# Patient Record
Sex: Male | Born: 1980 | Race: White | Hispanic: No | Marital: Married | Smoking: Never smoker
Health system: Southern US, Community
[De-identification: ages and names within clinical notes are randomized; demographics above are authoritative.]

## PROBLEM LIST (undated history)

## (undated) ENCOUNTER — Encounter
Attending: Student in an Organized Health Care Education/Training Program | Primary: Student in an Organized Health Care Education/Training Program

## (undated) ENCOUNTER — Encounter: Attending: Dermatology | Primary: Dermatology

## (undated) ENCOUNTER — Ambulatory Visit

## (undated) ENCOUNTER — Encounter: Attending: Family | Primary: Family

## (undated) ENCOUNTER — Encounter

## (undated) ENCOUNTER — Ambulatory Visit: Payer: MEDICAID | Attending: Family | Primary: Family

## (undated) ENCOUNTER — Telehealth

## (undated) ENCOUNTER — Ambulatory Visit: Payer: MEDICARE

## (undated) ENCOUNTER — Ambulatory Visit: Payer: MEDICAID

## (undated) ENCOUNTER — Encounter: Payer: MEDICAID | Attending: Dermatology | Primary: Dermatology

## (undated) ENCOUNTER — Ambulatory Visit: Payer: MEDICARE | Attending: Family | Primary: Family

## (undated) ENCOUNTER — Ambulatory Visit: Payer: MEDICAID | Attending: Hematology & Oncology | Primary: Hematology & Oncology

## (undated) ENCOUNTER — Ambulatory Visit
Payer: Medicare (Managed Care) | Attending: Student in an Organized Health Care Education/Training Program | Primary: Student in an Organized Health Care Education/Training Program

## (undated) ENCOUNTER — Ambulatory Visit: Payer: MEDICAID | Attending: Obesity Medicine | Primary: Obesity Medicine

## (undated) ENCOUNTER — Telehealth: Attending: Family | Primary: Family

## (undated) ENCOUNTER — Encounter: Attending: Physician Assistant | Primary: Physician Assistant

## (undated) ENCOUNTER — Encounter: Attending: Registered" | Primary: Registered"

## (undated) ENCOUNTER — Ambulatory Visit: Payer: MEDICAID | Attending: Registered" | Primary: Registered"

## (undated) ENCOUNTER — Ambulatory Visit: Payer: MEDICARE | Attending: Retina Specialist | Primary: Retina Specialist

## (undated) ENCOUNTER — Ambulatory Visit: Payer: Medicare (Managed Care) | Attending: Family | Primary: Family

## (undated) ENCOUNTER — Telehealth: Attending: Dermatology | Primary: Dermatology

## (undated) ENCOUNTER — Ambulatory Visit: Attending: Family | Primary: Family

## (undated) ENCOUNTER — Other Ambulatory Visit

## (undated) ENCOUNTER — Ambulatory Visit: Payer: MEDICARE | Attending: Physician Assistant | Primary: Physician Assistant

## (undated) ENCOUNTER — Ambulatory Visit: Payer: Medicare (Managed Care) | Attending: Retina Specialist | Primary: Retina Specialist

## (undated) ENCOUNTER — Ambulatory Visit
Payer: MEDICAID | Attending: Student in an Organized Health Care Education/Training Program | Primary: Student in an Organized Health Care Education/Training Program

## (undated) ENCOUNTER — Non-Acute Institutional Stay: Payer: MEDICAID

## (undated) ENCOUNTER — Encounter: Attending: Critical Care Medicine | Primary: Critical Care Medicine

## (undated) ENCOUNTER — Encounter: Attending: Internal Medicine | Primary: Internal Medicine

## (undated) ENCOUNTER — Ambulatory Visit: Payer: Medicare (Managed Care)

## (undated) ENCOUNTER — Encounter: Payer: MEDICAID | Attending: Family | Primary: Family

## (undated) ENCOUNTER — Ambulatory Visit: Payer: Medicare (Managed Care) | Attending: Physician Assistant | Primary: Physician Assistant

## (undated) ENCOUNTER — Ambulatory Visit
Payer: MEDICARE | Attending: Student in an Organized Health Care Education/Training Program | Primary: Student in an Organized Health Care Education/Training Program

## (undated) ENCOUNTER — Ambulatory Visit: Payer: MEDICAID | Attending: Dermatology | Primary: Dermatology

## (undated) ENCOUNTER — Encounter: Payer: MEDICARE | Attending: Dermatology | Primary: Dermatology

## (undated) ENCOUNTER — Telehealth
Attending: Student in an Organized Health Care Education/Training Program | Primary: Student in an Organized Health Care Education/Training Program

## (undated) ENCOUNTER — Other Ambulatory Visit: Payer: MEDICAID

## (undated) DIAGNOSIS — J45909 Unspecified asthma, uncomplicated: Secondary | ICD-10-CM

## (undated) DIAGNOSIS — F32A Depression, unspecified: Secondary | ICD-10-CM

## (undated) DIAGNOSIS — E079 Disorder of thyroid, unspecified: Secondary | ICD-10-CM

## (undated) DIAGNOSIS — M109 Gout, unspecified: Secondary | ICD-10-CM

## (undated) DIAGNOSIS — E119 Type 2 diabetes mellitus without complications: Secondary | ICD-10-CM

## (undated) DIAGNOSIS — K589 Irritable bowel syndrome without diarrhea: Secondary | ICD-10-CM

## (undated) DIAGNOSIS — G4730 Sleep apnea, unspecified: Secondary | ICD-10-CM

## (undated) DIAGNOSIS — F329 Major depressive disorder, single episode, unspecified: Secondary | ICD-10-CM

## (undated) HISTORY — PX: ESOPHAGOGASTRODUODENOSCOPY ENDOSCOPY: SHX5814

## (undated) HISTORY — PX: OTHER SURGICAL HISTORY: SHX169

## (undated) HISTORY — PX: COLONOSCOPY: SHX174

## (undated) HISTORY — PX: EYE SURGERY: SHX253

## (undated) MED ORDER — METHYLPREDNISOLONE 4 MG TABLETS IN A DOSE PACK: 0.00000 days

## (undated) MED ORDER — INSULIN SYRINGE U-100 WITH NEEDLE 1 ML 31 GAUGE X 5/16" (8 MM): 0.00000 days

## (undated) MED ORDER — MONTELUKAST 10 MG TABLET: 0.00000 days

## (undated) MED ORDER — CIPROFLOXACIN 0.3 %-DEXAMETHASONE 0.1 % EAR DROPS,SUSPENSION: 0.00000 days

## (undated) MED ORDER — CLINDAMYCIN PHOSPHATE 1 % TOPICAL SOLUTION: 0.00000 days

## (undated) MED ORDER — LANTUS U-100 INSULIN 100 UNIT/ML SUBCUTANEOUS SOLUTION: 0.00000 days

## (undated) MED ORDER — DOXYCYCLINE HYCLATE 100 MG CAPSULE: 0.00000 days

## (undated) MED ORDER — LANCETS: 0.00000 days

## (undated) MED ORDER — DOXYCYCLINE HYCLATE 100 MG TABLET: 0.00000 days

## (undated) MED ORDER — NEOMYCIN-POLYMYXIN-HYDROCORT 3.5 MG-10,000 UNIT/ML-1 % EAR DROPS,SUSP: 0.00000 days

## (undated) MED ORDER — TRAMADOL 50 MG TABLET: 0 days

## (undated) MED ORDER — DIAZEPAM 5 MG TABLET: 0.00000 days

## (undated) MED ORDER — METHYLPREDNISOLONE 4 MG TABLET
0 days
Start: ? — End: 2020-10-11

## (undated) MED ORDER — ERENUMAB-AOOE 140 MG/ML SUBCUTANEOUS AUTO-INJECTOR: 140 mg | mL | 6 refills | 0 days

## (undated) MED ORDER — DEXTROAMPHETAMINE-AMPHETAMINE ER 20 MG 24HR CAPSULE,EXTEND RELEASE: 0.00000 days

## (undated) MED ORDER — ACCU-CHEK GUIDE TEST STRIPS: 0.00000 days

---
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
## 2004-10-09 ENCOUNTER — Emergency Department: Payer: Self-pay | Admitting: Unknown Physician Specialty

## 2008-10-14 ENCOUNTER — Emergency Department: Payer: Self-pay | Admitting: Emergency Medicine

## 2008-10-19 ENCOUNTER — Emergency Department: Payer: Self-pay | Admitting: Emergency Medicine

## 2009-01-03 ENCOUNTER — Emergency Department: Payer: Self-pay | Admitting: Emergency Medicine

## 2009-01-06 ENCOUNTER — Emergency Department: Payer: Self-pay | Admitting: Emergency Medicine

## 2009-02-07 ENCOUNTER — Emergency Department: Payer: Self-pay | Admitting: Emergency Medicine

## 2009-08-04 ENCOUNTER — Emergency Department: Payer: Self-pay | Admitting: Emergency Medicine

## 2009-09-02 ENCOUNTER — Emergency Department: Payer: Self-pay | Admitting: Emergency Medicine

## 2009-10-20 ENCOUNTER — Emergency Department: Payer: Self-pay | Admitting: Emergency Medicine

## 2009-12-23 ENCOUNTER — Inpatient Hospital Stay: Payer: Self-pay | Admitting: Psychiatry

## 2010-01-09 ENCOUNTER — Ambulatory Visit: Payer: Self-pay

## 2010-05-17 ENCOUNTER — Emergency Department: Payer: Self-pay | Admitting: Emergency Medicine

## 2010-06-29 ENCOUNTER — Emergency Department: Payer: Self-pay | Admitting: Emergency Medicine

## 2010-07-10 DIAGNOSIS — G5630 Lesion of radial nerve, unspecified upper limb: Secondary | ICD-10-CM | POA: Insufficient documentation

## 2010-08-20 ENCOUNTER — Emergency Department: Payer: Self-pay | Admitting: Emergency Medicine

## 2011-04-29 ENCOUNTER — Emergency Department: Payer: Self-pay | Admitting: Emergency Medicine

## 2011-07-04 ENCOUNTER — Emergency Department: Payer: Self-pay | Admitting: Emergency Medicine

## 2011-07-04 LAB — URINALYSIS, COMPLETE
Bacteria: NONE SEEN
Bilirubin,UR: NEGATIVE
Blood: NEGATIVE
Glucose,UR: NEGATIVE mg/dL (ref 0–75)
Ketone: NEGATIVE
Leukocyte Esterase: NEGATIVE
Nitrite: NEGATIVE
Ph: 5 (ref 4.5–8.0)
RBC,UR: 4 /HPF (ref 0–5)
Specific Gravity: 1.021 (ref 1.003–1.030)
Squamous Epithelial: <1

## 2011-08-04 ENCOUNTER — Emergency Department: Payer: Self-pay | Admitting: *Deleted

## 2011-08-26 ENCOUNTER — Emergency Department: Payer: Self-pay | Admitting: Emergency Medicine

## 2011-08-26 LAB — COMPREHENSIVE METABOLIC PANEL
Albumin: 3.8 g/dL (ref 3.4–5.0)
Alkaline Phosphatase: 70 U/L (ref 50–136)
Anion Gap: 12 (ref 7–16)
Calcium, Total: 8.9 mg/dL (ref 8.5–10.1)
Co2: 24 mmol/L (ref 21–32)
Creatinine: 0.95 mg/dL (ref 0.60–1.30)
EGFR (Non-African Amer.): >60
Glucose: 107 mg/dL — ABNORMAL HIGH (ref 65–99)
Osmolality: 283 (ref 275–301)
SGPT (ALT): 52 U/L

## 2011-08-26 LAB — CBC
HCT: 43.6 % (ref 40.0–52.0)
HGB: 14.9 g/dL (ref 13.0–18.0)
MCH: 32.2 pg (ref 26.0–34.0)
MCV: 94 fL (ref 80–100)
Platelet: 192 10*3/uL (ref 150–440)
RBC: 4.62 10*6/uL (ref 4.40–5.90)
WBC: 9.4 10*3/uL (ref 3.8–10.6)

## 2011-11-29 ENCOUNTER — Emergency Department: Payer: Self-pay | Admitting: Emergency Medicine

## 2012-01-26 ENCOUNTER — Emergency Department: Payer: Self-pay | Admitting: Unknown Physician Specialty

## 2012-01-27 LAB — COMPREHENSIVE METABOLIC PANEL
Albumin: 3.5 g/dL (ref 3.4–5.0)
Alkaline Phosphatase: 82 U/L (ref 50–136)
Anion Gap: 8 (ref 7–16)
Calcium, Total: 9 mg/dL (ref 8.5–10.1)
EGFR (Non-African Amer.): >60
SGOT(AST): 34 U/L (ref 15–37)
SGPT (ALT): 51 U/L (ref 12–78)
Total Protein: 7.5 g/dL (ref 6.4–8.2)

## 2012-01-27 LAB — CBC
HGB: 15 g/dL (ref 13.0–18.0)
MCH: 31.8 pg (ref 26.0–34.0)
MCHC: 33.9 g/dL (ref 32.0–36.0)
MCV: 94 fL (ref 80–100)
RBC: 4.71 10*6/uL (ref 4.40–5.90)

## 2012-03-16 ENCOUNTER — Emergency Department: Payer: Self-pay | Admitting: Emergency Medicine

## 2012-04-06 ENCOUNTER — Emergency Department: Payer: Self-pay | Admitting: Emergency Medicine

## 2012-04-12 ENCOUNTER — Emergency Department: Payer: Self-pay | Admitting: Emergency Medicine

## 2012-04-19 ENCOUNTER — Emergency Department: Payer: Self-pay | Admitting: Emergency Medicine

## 2012-05-20 ENCOUNTER — Emergency Department: Payer: Self-pay | Admitting: Emergency Medicine

## 2012-06-11 ENCOUNTER — Emergency Department: Payer: Self-pay | Admitting: Emergency Medicine

## 2012-07-21 DIAGNOSIS — G4733 Obstructive sleep apnea (adult) (pediatric): Secondary | ICD-10-CM | POA: Insufficient documentation

## 2012-09-27 ENCOUNTER — Emergency Department: Payer: Self-pay | Admitting: Emergency Medicine

## 2012-09-27 LAB — URINALYSIS, COMPLETE
Ketone: NEGATIVE
Leukocyte Esterase: NEGATIVE
Ph: 6 (ref 4.5–8.0)
RBC,UR: 1 /HPF (ref 0–5)
Specific Gravity: 1.029 (ref 1.003–1.030)
Squamous Epithelial: <1
WBC UR: 1 /HPF (ref 0–5)

## 2012-09-27 LAB — COMPREHENSIVE METABOLIC PANEL
Alkaline Phosphatase: 122 U/L (ref 50–136)
Anion Gap: 8 (ref 7–16)
Bilirubin,Total: 0.7 mg/dL (ref 0.2–1.0)
Calcium, Total: 8.5 mg/dL (ref 8.5–10.1)
Chloride: 101 mmol/L (ref 98–107)
Co2: 25 mmol/L (ref 21–32)
EGFR (African American): >60
EGFR (Non-African Amer.): >60
Potassium: 4.2 mmol/L (ref 3.5–5.1)
SGOT(AST): 94 U/L — ABNORMAL HIGH (ref 15–37)

## 2012-09-27 LAB — CBC
HCT: 43 % (ref 40.0–52.0)
MCH: 32.4 pg (ref 26.0–34.0)
MCV: 94 fL (ref 80–100)
RBC: 4.56 10*6/uL (ref 4.40–5.90)
RDW: 13.8 % (ref 11.5–14.5)

## 2012-09-28 LAB — LIPASE, BLOOD: Lipase: 384 U/L (ref 73–393)

## 2012-10-05 ENCOUNTER — Emergency Department: Payer: Self-pay | Admitting: Emergency Medicine

## 2012-10-05 LAB — COMPREHENSIVE METABOLIC PANEL
Anion Gap: 9 (ref 7–16)
BUN: 9 mg/dL (ref 7–18)
Bilirubin,Total: 0.8 mg/dL (ref 0.2–1.0)
Calcium, Total: 9 mg/dL (ref 8.5–10.1)
Co2: 26 mmol/L (ref 21–32)
Creatinine: 0.86 mg/dL (ref 0.60–1.30)
Glucose: 455 mg/dL — ABNORMAL HIGH (ref 65–99)
Osmolality: 287 (ref 275–301)
SGOT(AST): 114 U/L — ABNORMAL HIGH (ref 15–37)
Sodium: 134 mmol/L — ABNORMAL LOW (ref 136–145)
Total Protein: 8 g/dL (ref 6.4–8.2)

## 2012-10-05 LAB — URINALYSIS, COMPLETE
Bilirubin,UR: NEGATIVE
Glucose,UR: 300 mg/dL (ref 0–75)
Leukocyte Esterase: NEGATIVE
Nitrite: NEGATIVE
Ph: 6 (ref 4.5–8.0)
RBC,UR: 1 /HPF (ref 0–5)
Squamous Epithelial: 1

## 2012-10-05 LAB — CBC
HCT: 43.5 % (ref 40.0–52.0)
HGB: 15.1 g/dL (ref 13.0–18.0)
MCH: 32.3 pg (ref 26.0–34.0)
MCHC: 34.6 g/dL (ref 32.0–36.0)
MCV: 93 fL (ref 80–100)
Platelet: 159 10*3/uL (ref 150–440)
RBC: 4.67 10*6/uL (ref 4.40–5.90)

## 2012-10-06 LAB — CK TOTAL AND CKMB (NOT AT ARMC): CK, Total: 187 U/L (ref 35–232)

## 2012-10-06 LAB — TROPONIN I: Troponin-I: <0.02 ng/mL

## 2012-12-05 ENCOUNTER — Emergency Department: Payer: Self-pay | Admitting: Emergency Medicine

## 2012-12-05 LAB — URINALYSIS, COMPLETE
Glucose,UR: >=500 mg/dL (ref 0–75)
Ph: 6 (ref 4.5–8.0)
Protein: NEGATIVE
Specific Gravity: 1.025 (ref 1.003–1.030)
Squamous Epithelial: 2
WBC UR: 4 /HPF (ref 0–5)

## 2012-12-05 LAB — BASIC METABOLIC PANEL
BUN: 9 mg/dL (ref 7–18)
Chloride: 104 mmol/L (ref 98–107)
Co2: 27 mmol/L (ref 21–32)
EGFR (African American): >60
EGFR (Non-African Amer.): >60
Potassium: 4 mmol/L (ref 3.5–5.1)

## 2012-12-06 LAB — CBC WITH DIFFERENTIAL/PLATELET
Eosinophil %: 2.4 %
HGB: 13.3 g/dL (ref 13.0–18.0)
Lymphocyte #: 3.1 10*3/uL (ref 1.0–3.6)
MCH: 31.9 pg (ref 26.0–34.0)
Monocyte #: 0.6 x10 3/mm (ref 0.2–1.0)
Monocyte %: 5.5 %
Neutrophil #: 6 10*3/uL (ref 1.4–6.5)

## 2012-12-06 LAB — HEPATIC FUNCTION PANEL A (ARMC)
Bilirubin, Direct: 0.2 mg/dL (ref 0.00–0.20)
Bilirubin,Total: 1.1 mg/dL — ABNORMAL HIGH (ref 0.2–1.0)
SGOT(AST): 89 U/L — ABNORMAL HIGH (ref 15–37)

## 2013-01-11 ENCOUNTER — Emergency Department: Payer: Self-pay | Admitting: Emergency Medicine

## 2013-01-11 LAB — COMPREHENSIVE METABOLIC PANEL
Albumin: 3.5 g/dL (ref 3.4–5.0)
Alkaline Phosphatase: 93 U/L (ref 50–136)
Anion Gap: 4 — ABNORMAL LOW (ref 7–16)
Bilirubin,Total: 1 mg/dL (ref 0.2–1.0)
Chloride: 104 mmol/L (ref 98–107)
Co2: 29 mmol/L (ref 21–32)
EGFR (African American): >60
EGFR (Non-African Amer.): >60
SGOT(AST): 73 U/L — ABNORMAL HIGH (ref 15–37)
Sodium: 137 mmol/L (ref 136–145)
Total Protein: 7.8 g/dL (ref 6.4–8.2)

## 2013-01-11 LAB — URINALYSIS, COMPLETE
Blood: NEGATIVE
Glucose,UR: NEGATIVE mg/dL (ref 0–75)
Ketone: NEGATIVE
Leukocyte Esterase: NEGATIVE
Nitrite: NEGATIVE
Ph: 5 (ref 4.5–8.0)
Protein: NEGATIVE
Specific Gravity: 1.018 (ref 1.003–1.030)
WBC UR: 3 /HPF (ref 0–5)

## 2013-01-11 LAB — CBC
HCT: 39.6 % — ABNORMAL LOW (ref 40.0–52.0)
HGB: 13.9 g/dL (ref 13.0–18.0)
MCV: 93 fL (ref 80–100)
Platelet: 179 10*3/uL (ref 150–440)
RDW: 13.8 % (ref 11.5–14.5)
WBC: 9.3 10*3/uL (ref 3.8–10.6)

## 2013-01-11 LAB — LIPASE, BLOOD: Lipase: 188 U/L (ref 73–393)

## 2013-01-12 LAB — CLOSTRIDIUM DIFFICILE BY PCR

## 2013-01-27 ENCOUNTER — Encounter: Payer: Self-pay | Admitting: Family Medicine

## 2013-02-08 ENCOUNTER — Encounter: Payer: Self-pay | Admitting: Family Medicine

## 2013-02-09 DIAGNOSIS — R45851 Suicidal ideations: Secondary | ICD-10-CM | POA: Insufficient documentation

## 2013-02-11 ENCOUNTER — Emergency Department: Payer: Self-pay | Admitting: Emergency Medicine

## 2013-02-17 ENCOUNTER — Emergency Department: Payer: Self-pay | Admitting: Emergency Medicine

## 2013-03-05 DIAGNOSIS — R109 Unspecified abdominal pain: Secondary | ICD-10-CM | POA: Insufficient documentation

## 2013-07-05 DIAGNOSIS — R51 Headache: Secondary | ICD-10-CM

## 2013-07-05 DIAGNOSIS — R519 Headache, unspecified: Secondary | ICD-10-CM | POA: Insufficient documentation

## 2013-08-12 DIAGNOSIS — K760 Fatty (change of) liver, not elsewhere classified: Secondary | ICD-10-CM | POA: Insufficient documentation

## 2013-09-03 DIAGNOSIS — K644 Residual hemorrhoidal skin tags: Secondary | ICD-10-CM | POA: Insufficient documentation

## 2013-09-13 DIAGNOSIS — F419 Anxiety disorder, unspecified: Secondary | ICD-10-CM | POA: Insufficient documentation

## 2013-09-13 DIAGNOSIS — R197 Diarrhea, unspecified: Secondary | ICD-10-CM | POA: Insufficient documentation

## 2013-10-04 DIAGNOSIS — M25512 Pain in left shoulder: Secondary | ICD-10-CM | POA: Insufficient documentation

## 2013-11-15 ENCOUNTER — Emergency Department: Payer: Self-pay | Admitting: Emergency Medicine

## 2013-11-30 ENCOUNTER — Emergency Department: Payer: Self-pay | Admitting: Emergency Medicine

## 2013-11-30 LAB — TROPONIN I

## 2013-11-30 LAB — BASIC METABOLIC PANEL
ANION GAP: 8 (ref 7–16)
BUN: 9 mg/dL (ref 7–18)
CHLORIDE: 104 mmol/L (ref 98–107)
CO2: 29 mmol/L (ref 21–32)
CREATININE: 0.83 mg/dL (ref 0.60–1.30)
Calcium, Total: 8.7 mg/dL (ref 8.5–10.1)
EGFR (African American): >60
EGFR (Non-African Amer.): >60
GLUCOSE: 136 mg/dL — AB (ref 65–99)
Osmolality: 282 (ref 275–301)
POTASSIUM: 4 mmol/L (ref 3.5–5.1)
Sodium: 141 mmol/L (ref 136–145)

## 2013-11-30 LAB — D-DIMER(ARMC): D-DIMER: 192 ng/mL

## 2013-11-30 LAB — CBC
HCT: 42 % (ref 40.0–52.0)
HGB: 13.7 g/dL (ref 13.0–18.0)
MCH: 31 pg (ref 26.0–34.0)
MCHC: 32.6 g/dL (ref 32.0–36.0)
MCV: 95 fL (ref 80–100)
Platelet: 174 10*3/uL (ref 150–440)
RBC: 4.41 10*6/uL (ref 4.40–5.90)
RDW: 14.4 % (ref 11.5–14.5)
WBC: 11 10*3/uL — ABNORMAL HIGH (ref 3.8–10.6)

## 2013-12-24 ENCOUNTER — Emergency Department: Payer: Self-pay | Admitting: Emergency Medicine

## 2013-12-24 LAB — COMPREHENSIVE METABOLIC PANEL
ALT: 51 U/L (ref 12–78)
Albumin: 3.9 g/dL (ref 3.4–5.0)
Alkaline Phosphatase: 90 U/L
Anion Gap: 8 (ref 7–16)
BILIRUBIN TOTAL: 1.7 mg/dL — AB (ref 0.2–1.0)
BUN: 10 mg/dL (ref 7–18)
CHLORIDE: 104 mmol/L (ref 98–107)
Calcium, Total: 9.2 mg/dL (ref 8.5–10.1)
Co2: 26 mmol/L (ref 21–32)
Creatinine: 1.07 mg/dL (ref 0.60–1.30)
EGFR (African American): >60
EGFR (Non-African Amer.): >60
Glucose: 130 mg/dL — ABNORMAL HIGH (ref 65–99)
Osmolality: 276 (ref 275–301)
Potassium: 3.7 mmol/L (ref 3.5–5.1)
SGOT(AST): 44 U/L — ABNORMAL HIGH (ref 15–37)
SODIUM: 138 mmol/L (ref 136–145)
TOTAL PROTEIN: 8.4 g/dL — AB (ref 6.4–8.2)

## 2013-12-24 LAB — URINALYSIS, COMPLETE
BLOOD: NEGATIVE
Bilirubin,UR: NEGATIVE
Glucose,UR: NEGATIVE mg/dL (ref 0–75)
Ketone: NEGATIVE
LEUKOCYTE ESTERASE: NEGATIVE
Nitrite: NEGATIVE
Ph: 5 (ref 4.5–8.0)
Protein: NEGATIVE
RBC,UR: 3 /HPF (ref 0–5)
Specific Gravity: 1.025 (ref 1.003–1.030)
Squamous Epithelial: 3
WBC UR: 9 /HPF (ref 0–5)

## 2013-12-24 LAB — CBC WITH DIFFERENTIAL/PLATELET
BASOS PCT: 1.5 %
Basophil #: 0.2 10*3/uL — ABNORMAL HIGH (ref 0.0–0.1)
EOS PCT: 1.2 %
Eosinophil #: 0.2 10*3/uL (ref 0.0–0.7)
HCT: 46.6 % (ref 40.0–52.0)
HGB: 15.4 g/dL (ref 13.0–18.0)
LYMPHS ABS: 3.7 10*3/uL — AB (ref 1.0–3.6)
LYMPHS PCT: 28.6 %
MCH: 31.7 pg (ref 26.0–34.0)
MCHC: 33 g/dL (ref 32.0–36.0)
MCV: 96 fL (ref 80–100)
MONOS PCT: 9.2 %
Monocyte #: 1.2 x10 3/mm — ABNORMAL HIGH (ref 0.2–1.0)
Neutrophil #: 7.8 10*3/uL — ABNORMAL HIGH (ref 1.4–6.5)
Neutrophil %: 59.5 %
Platelet: 184 10*3/uL (ref 150–440)
RBC: 4.85 10*6/uL (ref 4.40–5.90)
RDW: 14.3 % (ref 11.5–14.5)
WBC: 13 10*3/uL — ABNORMAL HIGH (ref 3.8–10.6)

## 2013-12-24 LAB — LIPASE, BLOOD: LIPASE: 160 U/L (ref 73–393)

## 2014-01-17 DIAGNOSIS — L918 Other hypertrophic disorders of the skin: Secondary | ICD-10-CM | POA: Insufficient documentation

## 2014-02-13 ENCOUNTER — Emergency Department: Payer: Self-pay | Admitting: Emergency Medicine

## 2014-02-14 LAB — URINALYSIS, COMPLETE
Bilirubin,UR: NEGATIVE
Blood: NEGATIVE
KETONE: NEGATIVE
Nitrite: NEGATIVE
PH: 6 (ref 4.5–8.0)
Protein: NEGATIVE
Specific Gravity: 1.023 (ref 1.003–1.030)
Squamous Epithelial: 4

## 2014-02-14 LAB — COMPREHENSIVE METABOLIC PANEL
ALBUMIN: 3.5 g/dL (ref 3.4–5.0)
ALT: 76 U/L — AB
ANION GAP: 10 (ref 7–16)
Alkaline Phosphatase: 91 U/L
BILIRUBIN TOTAL: 0.9 mg/dL (ref 0.2–1.0)
BUN: 12 mg/dL (ref 7–18)
CALCIUM: 8.4 mg/dL — AB (ref 8.5–10.1)
CHLORIDE: 101 mmol/L (ref 98–107)
Co2: 26 mmol/L (ref 21–32)
Creatinine: 1.01 mg/dL (ref 0.60–1.30)
EGFR (African American): >60
Glucose: 307 mg/dL — ABNORMAL HIGH (ref 65–99)
Osmolality: 285 (ref 275–301)
Potassium: 3.8 mmol/L (ref 3.5–5.1)
SGOT(AST): 62 U/L — ABNORMAL HIGH (ref 15–37)
SODIUM: 137 mmol/L (ref 136–145)
TOTAL PROTEIN: 7.4 g/dL (ref 6.4–8.2)

## 2014-02-14 LAB — CBC
HCT: 43.4 % (ref 40.0–52.0)
HGB: 14.8 g/dL (ref 13.0–18.0)
MCH: 32.1 pg (ref 26.0–34.0)
MCHC: 34.2 g/dL (ref 32.0–36.0)
MCV: 94 fL (ref 80–100)
Platelet: 175 10*3/uL (ref 150–440)
RBC: 4.62 10*6/uL (ref 4.40–5.90)
RDW: 13.8 % (ref 11.5–14.5)
WBC: 8 10*3/uL (ref 3.8–10.6)

## 2014-02-14 LAB — GC/CHLAMYDIA PROBE AMP

## 2014-02-16 DIAGNOSIS — B3742 Candidal balanitis: Secondary | ICD-10-CM | POA: Insufficient documentation

## 2014-02-17 ENCOUNTER — Emergency Department: Payer: Self-pay | Admitting: Emergency Medicine

## 2014-03-11 ENCOUNTER — Ambulatory Visit: Payer: Self-pay | Admitting: Family Medicine

## 2014-05-03 ENCOUNTER — Emergency Department: Payer: Self-pay | Admitting: Emergency Medicine

## 2014-06-18 ENCOUNTER — Emergency Department: Payer: Self-pay | Admitting: Emergency Medicine

## 2014-07-08 DIAGNOSIS — F5101 Primary insomnia: Secondary | ICD-10-CM | POA: Insufficient documentation

## 2014-10-20 DIAGNOSIS — W57XXXA Bitten or stung by nonvenomous insect and other nonvenomous arthropods, initial encounter: Secondary | ICD-10-CM | POA: Insufficient documentation

## 2014-12-20 ENCOUNTER — Emergency Department
Admission: EM | Admit: 2014-12-20 | Discharge: 2014-12-20 | Disposition: A | Discharge status: Home / Self Care | Payer: Medicaid Other | Attending: Emergency Medicine | Admitting: Emergency Medicine

## 2014-12-20 ENCOUNTER — Emergency Department: Payer: Medicaid Other

## 2014-12-20 ENCOUNTER — Encounter: Payer: Self-pay | Admitting: *Deleted

## 2014-12-20 DIAGNOSIS — Z79899 Other long term (current) drug therapy: Secondary | ICD-10-CM | POA: Insufficient documentation

## 2014-12-20 DIAGNOSIS — X58XXXA Exposure to other specified factors, initial encounter: Secondary | ICD-10-CM | POA: Diagnosis not present

## 2014-12-20 DIAGNOSIS — Y9289 Other specified places as the place of occurrence of the external cause: Secondary | ICD-10-CM | POA: Diagnosis not present

## 2014-12-20 DIAGNOSIS — F32A Depression, unspecified: Secondary | ICD-10-CM | POA: Insufficient documentation

## 2014-12-20 DIAGNOSIS — Z880 Allergy status to penicillin: Secondary | ICD-10-CM | POA: Insufficient documentation

## 2014-12-20 DIAGNOSIS — E119 Type 2 diabetes mellitus without complications: Secondary | ICD-10-CM | POA: Diagnosis not present

## 2014-12-20 DIAGNOSIS — Y9389 Activity, other specified: Secondary | ICD-10-CM | POA: Insufficient documentation

## 2014-12-20 DIAGNOSIS — S96912A Strain of unspecified muscle and tendon at ankle and foot level, left foot, initial encounter: Secondary | ICD-10-CM | POA: Insufficient documentation

## 2014-12-20 DIAGNOSIS — Y998 Other external cause status: Secondary | ICD-10-CM | POA: Insufficient documentation

## 2014-12-20 DIAGNOSIS — F329 Major depressive disorder, single episode, unspecified: Secondary | ICD-10-CM | POA: Insufficient documentation

## 2014-12-20 DIAGNOSIS — S99922A Unspecified injury of left foot, initial encounter: Secondary | ICD-10-CM | POA: Diagnosis present

## 2014-12-20 HISTORY — DX: Major depressive disorder, single episode, unspecified: F32.9

## 2014-12-20 HISTORY — DX: Depression, unspecified: F32.A

## 2014-12-20 HISTORY — DX: Type 2 diabetes mellitus without complications: E11.9

## 2014-12-20 MED ORDER — HYDROCODONE-ACETAMINOPHEN 5-325 MG PO TABS: 1.0000 | ORAL_TABLET | ORAL | Status: DC | PRN

## 2014-12-20 MED ORDER — TRAMADOL HCL 50 MG PO TABS: 100.0000 mg | ORAL_TABLET | Freq: Four times a day (QID) | ORAL | Status: DC | PRN

## 2014-12-20 MED ORDER — TRAMADOL HCL 50 MG PO TABS
100.0000 mg | ORAL_TABLET | Freq: Once | ORAL | Status: AC
  Administered 2014-12-20: 100 mg via ORAL
  Filled 2014-12-20: qty 2

## 2014-12-20 NOTE — ED Notes (Signed)
Left foot swelling 

## 2014-12-20 NOTE — ED Provider Notes (Signed)
[facility] Emergency Department Provider Note ____________________________________________  Time seen: Approximately 3:53 PM  I have reviewed the triage vital signs and the nursing notes.   HISTORY  Chief Complaint Foot Pain   HPI Jason Davidson is a 34 y.o. male who presents to the emergency department for evaluation of left foot pain. He denies injury. He states the painstarted on Saturday after being [place] the [place]. He states this pain is different from his typical gout pain. He's had no relief with Tylenol with codeine.    Past Medical History  Diagnosis Date  . Diabetes mellitus without complication   . Depressed     Patient Active Problem List   Diagnosis Date Noted  . Depressed     History reviewed. No pertinent past surgical history.  Current Outpatient Rx  Name  Route  Sig  Dispense  Refill  . ALPRAZolam (XANAX) 0.5 MG tablet   Oral   Take 0.5 mg by mouth at bedtime as needed for anxiety.         . citalopram (CELEXA) 40 MG tablet   Oral   Take 40 mg by mouth daily.         Marland Kitchen. dicyclomine (BENTYL) 10 MG capsule   Oral   Take 10 mg by mouth 3 (three) times daily before meals.         . gabapentin (NEURONTIN) 100 MG capsule   Oral   Take 100 mg by mouth 3 (three) times daily.         Marland Kitchen. glipiZIDE (GLUCOTROL) 10 MG tablet   Oral   Take 10 mg by mouth daily before breakfast.         . insulin glargine (LANTUS) 100 UNIT/ML injection   Subcutaneous   Inject into the skin at bedtime.         . pantoprazole (PROTONIX) 40 MG tablet   Oral   Take 40 mg by mouth daily.         . ranitidine (ZANTAC) 150 MG capsule   Oral   Take 150 mg by mouth 2 (two) times daily.         Marland Kitchen. topiramate (TOPAMAX) 50 MG tablet   Oral   Take 50 mg by mouth 2 (two) times daily.         . traMADol (ULTRAM) 50 MG tablet   Oral   Take 2 tablets (100 mg total) by mouth every 6 (six) hours as needed.   18 tablet    0     Allergies Erythromycin; Penicillins; and Sulfa antibiotics  No family history on file.  Social History History  Substance Use Topics  . Smoking status: Never Smoker   . Smokeless tobacco: Not on file  . Alcohol Use: No    Review of Systems Constitutional: No recent illness. Eyes: No visual changes. ENT: No sore throat. Cardiovascular: Denies chest pain or palpitations. Respiratory: Denies shortness of breath. Gastrointestinal: No abdominal pain.  Genitourinary: Negative for dysuria. Musculoskeletal: Pain in left foot. Skin: Negative for rash. Neurological: Negative for headaches, focal weakness or numbness. 10-point ROS otherwise negative.  ____________________________________________   PHYSICAL EXAM:  VITAL SIGNS: ED Triage Vitals  Enc Vitals Group     BP 12/20/14 1519 127/80 mmHg     Pulse Rate 12/20/14 1519 93     Resp 12/20/14 1519 20     Temp 12/20/14 1519 98.3 F (36.8 C)     Temp Source 12/20/14 1519 Oral  SpO2 12/20/14 1519 95 %     Weight 12/20/14 1519 416 lb (188.696 kg)     Height 12/20/14 1519  (1.803 m)     Head Cir --      Peak Flow --      Pain Score 12/20/14 1519 8     Pain Loc --      Pain Edu? --      Excl. in GC? --     Constitutional: Alert and oriented. Well appearing and in no acute distress. Eyes: Conjunctivae are normal. EOMI. Head: Atraumatic. Nose: No congestion/rhinnorhea. Neck: No stridor.  Respiratory: Normal respiratory effort.   Musculoskeletal: Generalized edema to the left foot from toes to just above the ankle. Full range of motion of the foot, toes, and ankle. Neurologic:  Normal speech and language. No gross focal neurologic deficits are appreciated. Speech is normal. No gait instability. Skin:  Skin is warm, dry and intact. Atraumatic. No indication of cellulitis Cardiovascular: PT pulses 2+, capillary refill <3seconds. Psychiatric: Mood and affect are normal. Speech and behavior are  normal.  ____________________________________________   LABS (all labs ordered are listed, but only abnormal results are displayed)  Labs Reviewed - No data to display ____________________________________________  RADIOLOGY  No acute bony abnormality or foreign body. ____________________________________________   PROCEDURES  Procedure(s) performed: Ace wrap applied over foot and ankle   ____________________________________________   INITIAL IMPRESSION / ASSESSMENT AND PLAN / ED COURSE  Pertinent labs & imaging results that were available during my care of the patient were reviewed by me and considered in my medical decision making (see chart for details).  Patient was advised to follow up with the podiatrist. He was advised to return to the ER for symptoms that change or worsen if he is unable to schedule an appointment. ____________________________________________   FINAL CLINICAL IMPRESSION(S) / ED DIAGNOSES  Final diagnoses:  Strain of foot, left, initial encounter       Chinita Pester, FNP 12/20/14 1657  Minna Antis, MD 12/20/14 2334

## 2014-12-20 NOTE — Discharge Instructions (Signed)
Follow up with the podiatrist for symptoms that are not improving over the week.

## 2015-01-24 ENCOUNTER — Emergency Department
Admission: EM | Admit: 2015-01-24 | Discharge: 2015-01-24 | Disposition: A | Discharge status: Home / Self Care | Payer: Medicaid Other | Attending: Emergency Medicine | Admitting: Emergency Medicine

## 2015-01-24 DIAGNOSIS — Z794 Long term (current) use of insulin: Secondary | ICD-10-CM | POA: Insufficient documentation

## 2015-01-24 DIAGNOSIS — Z79899 Other long term (current) drug therapy: Secondary | ICD-10-CM | POA: Diagnosis not present

## 2015-01-24 DIAGNOSIS — L600 Ingrowing nail: Secondary | ICD-10-CM

## 2015-01-24 DIAGNOSIS — E119 Type 2 diabetes mellitus without complications: Secondary | ICD-10-CM | POA: Diagnosis not present

## 2015-01-24 DIAGNOSIS — Z880 Allergy status to penicillin: Secondary | ICD-10-CM | POA: Diagnosis not present

## 2015-01-24 DIAGNOSIS — M79675 Pain in left toe(s): Secondary | ICD-10-CM | POA: Diagnosis present

## 2015-01-24 MED ORDER — KETOROLAC TROMETHAMINE 60 MG/2ML IM SOLN
60.0000 mg | Freq: Once | INTRAMUSCULAR | Status: AC
  Administered 2015-01-24: 60 mg via INTRAMUSCULAR
  Filled 2015-01-24: qty 2

## 2015-01-24 MED ORDER — IBUPROFEN 800 MG PO TABS: 800.0000 mg | ORAL_TABLET | Freq: Three times a day (TID) | ORAL | Status: DC | PRN

## 2015-01-24 MED ORDER — OXYCODONE-ACETAMINOPHEN 5-325 MG PO TABS: 1.0000 | ORAL_TABLET | ORAL | Status: DC | PRN

## 2015-01-24 MED ORDER — DOXYCYCLINE HYCLATE 50 MG PO CAPS: 100.0000 mg | ORAL_CAPSULE | Freq: Two times a day (BID) | ORAL | Status: AC

## 2015-01-24 NOTE — ED Notes (Signed)
Assess per PA 

## 2015-01-24 NOTE — ED Provider Notes (Signed)
[facility] Emergency Department Provider Note  ____________________________________________  Time seen: Approximately 6:51 AM  I have reviewed the triage vital signs and the nursing notes.   HISTORY  Chief Complaint Toe Pain    HPI Jason Davidson is a 34 y.o. male resents for complaints of ingrown toenail to left great toe. Past medical history significant for insulin-dependent diabetes reports he has not seen a podiatrist lately at all. Denies any trauma to the toe.   Past Medical History  Diagnosis Date  . Diabetes mellitus without complication   . Depressed     Patient Active Problem List   Diagnosis Date Noted  . Depressed     No past surgical history on file.  Current Outpatient Rx  Name  Route  Sig  Dispense  Refill  . ALPRAZolam (XANAX) 0.5 MG tablet   Oral   Take 0.5 mg by mouth at bedtime as needed for anxiety.         . citalopram (CELEXA) 40 MG tablet   Oral   Take 40 mg by mouth daily.         Marland Kitchen dicyclomine (BENTYL) 10 MG capsule   Oral   Take 10 mg by mouth 3 (three) times daily before meals.         Marland Kitchen doxycycline (VIBRAMYCIN) 50 MG capsule   Oral   Take 2 capsules (100 mg total) by mouth 2 (two) times daily.   40 capsule   0   . gabapentin (NEURONTIN) 100 MG capsule   Oral   Take 100 mg by mouth 3 (three) times daily.         Marland Kitchen glipiZIDE (GLUCOTROL) 10 MG tablet   Oral   Take 10 mg by mouth daily before breakfast.         . ibuprofen (ADVIL,MOTRIN) 800 MG tablet   Oral   Take 1 tablet (800 mg total) by mouth every 8 (eight) hours as needed.   30 tablet   0   . insulin glargine (LANTUS) 100 UNIT/ML injection   Subcutaneous   Inject into the skin at bedtime.         Marland Kitchen oxyCODONE-acetaminophen (ROXICET) 5-325 MG per tablet   Oral   Take 1-2 tablets by mouth every 4 (four) hours as needed for severe pain.   15 tablet   0   . pantoprazole (PROTONIX) 40 MG tablet   Oral   Take 40 mg by mouth  daily.         . ranitidine (ZANTAC) 150 MG capsule   Oral   Take 150 mg by mouth 2 (two) times daily.         Marland Kitchen topiramate (TOPAMAX) 50 MG tablet   Oral   Take 50 mg by mouth 2 (two) times daily.           Allergies Erythromycin; Penicillins; and Sulfa antibiotics  No family history on file.  Social History Social History  Substance Use Topics  . Smoking status: Never Smoker   . Smokeless tobacco: Not on file  . Alcohol Use: No    Review of Systems Constitutional: No fever/chills Eyes: No visual changes. ENT: No sore throat. Cardiovascular: Denies chest pain. Respiratory: Denies shortness of breath. Genitourinary: Negative for dysuria. Musculoskeletal: Negative for back pain. Skin: Left great toe with some erythema edema and obvious ingrown toenail.  Neurological: Negative for headaches, focal weakness or numbness.  10-point ROS otherwise negative.  ____________________________________________   PHYSICAL EXAM:  VITAL  SIGNS: ED Triage Vitals  Enc Vitals Group     BP 01/24/15 0408 142/83 mmHg     Pulse Rate 01/24/15 0408 96     Resp 01/24/15 0408 18     Temp 01/24/15 0408 97.8 F (36.6 C)     Temp Source 01/24/15 0408 Oral     SpO2 01/24/15 0408 97 %     Weight 01/24/15 0408 413 lb (187.336 kg)     Height 01/24/15 0408  (1.803 m)     Head Cir --      Peak Flow --      Pain Score 01/24/15 0408 8     Pain Loc --      Pain Edu? --      Excl. in GC? --     Constitutional: Alert and oriented. Well appearing and in no acute distress. Neck: No stridor.   Cardiovascular: Normal rate, regular rhythm. Grossly normal heart sounds.  Good peripheral circulation. Respiratory: Normal respiratory effort.  No retractions. Lungs CTAB. Musculoskeletal: No lower extremity tenderness nor edema.  No joint effusions. Neurologic:  Normal speech and language. No gross focal neurologic deficits are appreciated. No gait instability. Skin:  Left great toe with  obvious erythema and edema. Tenderness noted to palpation or limited range of motion. Capillary refill intact. Psychiatric: Mood and affect are normal. Speech and behavior are normal.  ____________________________________________   LABS (all labs ordered are listed, but only abnormal results are displayed)  Labs Reviewed - No data to display ____________________________________________   PROCEDURES  Procedure(s) performed: None  Critical Care performed: No  ____________________________________________   INITIAL IMPRESSION / ASSESSMENT AND PLAN / ED COURSE  Pertinent labs & imaging results that were available during my care of the patient were reviewed by me and considered in my medical decision making (see chart for details).  Acute ingrown toenail left great toe. Referral to podiatry on call.Dr. Alberteen Spindle Toradol 60 mg IM given while in the clinic discharged with prescription for doxycycline 100 mg twice a day, Percocet 5/325, and Motrin 800 mg 3 times a day. Encouraged soaking in  Epsom salts twice a day  ____________________________________________   FINAL CLINICAL IMPRESSION(S) / ED DIAGNOSES  Final diagnoses:  Ingrown left big toenail     Evangeline Dakin, PA-C 01/24/15 0720  Sharyn Creamer, MD 01/25/15 2236

## 2015-01-24 NOTE — ED Notes (Signed)
Ingrown toenail to left great toe.  

## 2015-01-24 NOTE — Discharge Instructions (Signed)
Ingrown Toenail An ingrown toenail occurs when the sharp edge of your toenail grows into the skin. Causes of ingrown toenails include toenails clipped too far back or poorly fitting shoes. Activities involving sudden stops (basketball, tennis) causing "toe jamming" may lead to an ingrown nail. HOME CARE INSTRUCTIONS   Soak the whole foot in warm soapy water for 20 minutes, 3 times per day.  You may lift the edge of the nail away from the sore skin by wedging a small piece of cotton under the corner of the nail. Be careful not to dig (traumatize) and cause more injury to the area.  Wear shoes that fit well. While the ingrown nail is causing problems, sandals may be beneficial.  Trim your toenails regularly and carefully. Cut your toenails straight across, not in a curve. This will prevent injury to the skin at the corners of the toenail.  Keep your feet clean and dry.  Crutches may be helpful early in treatment if walking is painful.  Antibiotics, if prescribed, should be taken as directed.  Return for a wound check in 2 days or as directed.  Only take over-the-counter or prescription medicines for pain, discomfort, or fever as directed by your caregiver. SEEK IMMEDIATE MEDICAL CARE IF:   You have a fever.  You have increasing pain, redness, swelling, or heat at the wound site.  Your toe is not better in 7 days. If conservative treatment is not successful, surgical removal of a portion or all of the nail may be necessary. MAKE SURE YOU:   Understand these instructions.  Will watch your condition.  Will get help right away if you are not doing well or get worse. Document Released: 05/24/2000 Document Revised: 08/19/2011 Document Reviewed: 05/18/2008 Exit[employer] Patient Information 2015 [employer]. This information is not intended to replace advice given to you by your health care provider. Make sure you discuss any questions you have with your health care provider.  

## 2015-02-10 ENCOUNTER — Encounter: Payer: Self-pay | Admitting: *Deleted

## 2015-02-10 ENCOUNTER — Emergency Department
Admission: EM | Admit: 2015-02-10 | Discharge: 2015-02-10 | Disposition: A | Discharge status: Home / Self Care | Payer: Medicaid Other | Attending: Emergency Medicine | Admitting: Emergency Medicine

## 2015-02-10 DIAGNOSIS — L02215 Cutaneous abscess of perineum: Secondary | ICD-10-CM | POA: Diagnosis not present

## 2015-02-10 DIAGNOSIS — L02214 Cutaneous abscess of groin: Secondary | ICD-10-CM | POA: Diagnosis present

## 2015-02-10 DIAGNOSIS — Z794 Long term (current) use of insulin: Secondary | ICD-10-CM | POA: Insufficient documentation

## 2015-02-10 DIAGNOSIS — E119 Type 2 diabetes mellitus without complications: Secondary | ICD-10-CM | POA: Diagnosis not present

## 2015-02-10 DIAGNOSIS — Z880 Allergy status to penicillin: Secondary | ICD-10-CM | POA: Insufficient documentation

## 2015-02-10 DIAGNOSIS — Z79899 Other long term (current) drug therapy: Secondary | ICD-10-CM | POA: Insufficient documentation

## 2015-02-10 DIAGNOSIS — L0292 Furuncle, unspecified: Secondary | ICD-10-CM

## 2015-02-10 MED ORDER — IBUPROFEN 800 MG PO TABS: 800.0000 mg | ORAL_TABLET | Freq: Three times a day (TID) | ORAL | Status: DC | PRN

## 2015-02-10 MED ORDER — CLINDAMYCIN HCL 150 MG PO CAPS
300.0000 mg | ORAL_CAPSULE | Freq: Once | ORAL | Status: AC
  Administered 2015-02-10: 300 mg via ORAL
  Filled 2015-02-10: qty 2

## 2015-02-10 MED ORDER — OXYCODONE-ACETAMINOPHEN 5-325 MG PO TABS
1.0000 | ORAL_TABLET | Freq: Once | ORAL | Status: AC
  Administered 2015-02-10: 1 via ORAL
  Filled 2015-02-10: qty 1

## 2015-02-10 MED ORDER — CLINDAMYCIN HCL 300 MG PO CAPS: 300.0000 mg | ORAL_CAPSULE | Freq: Three times a day (TID) | ORAL | Status: AC

## 2015-02-10 NOTE — ED Notes (Signed)
Pt reports right groin boil for about 1 week that has been draining. Denies fevers.

## 2015-02-10 NOTE — ED Provider Notes (Signed)
[facility] Emergency Department Provider Note  ____________________________________________  Time seen: 2:00AM  I have reviewed the triage vital signs and the nursing notes.   HISTORY  Chief Complaint Abscess     HPI Jason Davidson is a 34 y.o. male presents with "boil in his groin". Patient states that its "been there for a while" patient states area is tender to the touch denies any fever denies any drainage.     Past Medical History  Diagnosis Date  . Diabetes mellitus without complication   . Depressed     Patient Active Problem List   Diagnosis Date Noted  . Depressed     History reviewed. No pertinent past surgical history.  Current Outpatient Rx  Name  Route  Sig  Dispense  Refill  . ALPRAZolam (XANAX) 0.5 MG tablet   Oral   Take 0.5 mg by mouth at bedtime as needed for anxiety.         . citalopram (CELEXA) 40 MG tablet   Oral   Take 40 mg by mouth daily.         Marland Kitchen dicyclomine (BENTYL) 10 MG capsule   Oral   Take 10 mg by mouth 3 (three) times daily before meals.         . gabapentin (NEURONTIN) 100 MG capsule   Oral   Take 100 mg by mouth 3 (three) times daily.         Marland Kitchen glipiZIDE (GLUCOTROL) 10 MG tablet   Oral   Take 10 mg by mouth daily before breakfast.         . ibuprofen (ADVIL,MOTRIN) 800 MG tablet   Oral   Take 1 tablet (800 mg total) by mouth every 8 (eight) hours as needed.   30 tablet   0   . insulin glargine (LANTUS) 100 UNIT/ML injection   Subcutaneous   Inject into the skin at bedtime.         Marland Kitchen oxyCODONE-acetaminophen (ROXICET) 5-325 MG per tablet   Oral   Take 1-2 tablets by mouth every 4 (four) hours as needed for severe pain.   15 tablet   0   . pantoprazole (PROTONIX) 40 MG tablet   Oral   Take 40 mg by mouth daily.         . ranitidine (ZANTAC) 150 MG capsule   Oral   Take 150 mg by mouth 2 (two) times daily.         Marland Kitchen topiramate (TOPAMAX) 50 MG tablet   Oral    Take 50 mg by mouth 2 (two) times daily.           Allergies Erythromycin; Penicillins; and Sulfa antibiotics  No family history on file.  Social History Social History  Substance Use Topics  . Smoking status: Never Smoker   . Smokeless tobacco: None  . Alcohol Use: No    Review of Systems  Constitutional: Negative for fever. Eyes: Negative for visual changes. ENT: Negative for sore throat. Cardiovascular: Negative for chest pain. Respiratory: Negative for shortness of breath. Gastrointestinal: Negative for abdominal pain, vomiting and diarrhea. Genitourinary: Negative for dysuria. Musculoskeletal: Negative for back pain. Skin: Negative for rash. Positive for abscess Neurological: Negative for headaches, focal weakness or numbness.   10-point ROS otherwise negative.  ____________________________________________   PHYSICAL EXAM:  VITAL SIGNS: ED Triage Vitals  Enc Vitals Group     BP 02/10/15 0133 137/76 mmHg     Pulse Rate 02/10/15 0133 101  Resp 02/10/15 0133 18     Temp 02/10/15 0133 98.2 F (36.8 C)     Temp Source 02/10/15 0133 Oral     SpO2 02/10/15 0133 95 %     Weight 02/10/15 0133 413 lb (187.336 kg)     Height 02/10/15 0133 5\' 11"  (1.803 m)     Head Cir --      Peak Flow --      Pain Score 02/10/15 0134 8     Pain Loc --      Pain Edu? --      Excl. in GC? --      Constitutional: Alert and oriented. Well appearing and in no distress. Eyes: Conjunctivae are normal. PERRL. Normal extraocular movements. ENT   Head: Normocephalic and atraumatic.   Nose: No congestion/rhinnorhea.   Mouth/Throat: Mucous membranes are moist.   Neck: No stridor. Cardiovascular: Normal rate, regular rhythm. Normal and symmetric distal pulses are present in all extremities. No murmurs, rubs, or gallops. Respiratory: Normal respiratory effort without tachypnea nor retractions. Breath sounds are clear and equal bilaterally. No  wheezes/rales/rhonchi. Gastrointestinal: Soft and nontender. No distention. There is no CVA tenderness. Genitourinary: deferred Musculoskeletal: Nontender with normal range of motion in all extremities. No joint effusions.  No lower extremity tenderness nor edema. Neurologic:  Normal speech and language. No gross focal neurologic deficits are appreciated. Speech is normal.  Skin:  Furuncle noted on the patient's mons pubis, no surrounding erythema noted no induration or flocculence. Psychiatric: Mood and affect are normal. Speech and behavior are normal. Patient exhibits appropriate insight and judgment.      INITIAL IMPRESSION / ASSESSMENT AND PLAN / ED COURSE  Pertinent labs & imaging results that were available during my care of the patient were reviewed by me and considered in my medical decision making (see chart for details).  History of physical exam consistent with a furuncle  ____________________________________________   FINAL CLINICAL IMPRESSION(S) / ED DIAGNOSES  Final diagnoses:  Furuncle      [name], MD 02/10/15 0210

## 2015-02-10 NOTE — Discharge Instructions (Signed)

## 2015-03-04 ENCOUNTER — Emergency Department
Admission: EM | Admit: 2015-03-04 | Discharge: 2015-03-04 | Disposition: A | Discharge status: Home / Self Care | Payer: Medicaid Other | Attending: Emergency Medicine | Admitting: Emergency Medicine

## 2015-03-04 DIAGNOSIS — N390 Urinary tract infection, site not specified: Secondary | ICD-10-CM | POA: Insufficient documentation

## 2015-03-04 DIAGNOSIS — Z794 Long term (current) use of insulin: Secondary | ICD-10-CM | POA: Insufficient documentation

## 2015-03-04 DIAGNOSIS — Z79899 Other long term (current) drug therapy: Secondary | ICD-10-CM | POA: Diagnosis not present

## 2015-03-04 DIAGNOSIS — R319 Hematuria, unspecified: Secondary | ICD-10-CM

## 2015-03-04 DIAGNOSIS — R1030 Lower abdominal pain, unspecified: Secondary | ICD-10-CM

## 2015-03-04 DIAGNOSIS — Z880 Allergy status to penicillin: Secondary | ICD-10-CM | POA: Insufficient documentation

## 2015-03-04 DIAGNOSIS — E119 Type 2 diabetes mellitus without complications: Secondary | ICD-10-CM | POA: Diagnosis not present

## 2015-03-04 DIAGNOSIS — R300 Dysuria: Secondary | ICD-10-CM | POA: Diagnosis present

## 2015-03-04 LAB — URINALYSIS COMPLETE WITH MICROSCOPIC (ARMC ONLY)
Bilirubin Urine: NEGATIVE
GLUCOSE, UA: NEGATIVE mg/dL
KETONES UR: NEGATIVE mg/dL
NITRITE: NEGATIVE
Protein, ur: 30 mg/dL — AB
SPECIFIC GRAVITY, URINE: 1.026 (ref 1.005–1.030)
pH: 5 (ref 5.0–8.0)

## 2015-03-04 MED ORDER — OXYCODONE-ACETAMINOPHEN 5-325 MG PO TABS
1.0000 | ORAL_TABLET | Freq: Once | ORAL | Status: AC
  Administered 2015-03-04: 1 via ORAL

## 2015-03-04 MED ORDER — OXYCODONE-ACETAMINOPHEN 5-325 MG PO TABS
ORAL_TABLET | ORAL | Status: AC
  Administered 2015-03-04: 1 via ORAL
  Filled 2015-03-04: qty 1

## 2015-03-04 MED ORDER — ONDANSETRON 4 MG PO TBDP
ORAL_TABLET | ORAL | Status: AC
  Administered 2015-03-04: 4 mg via ORAL
  Filled 2015-03-04: qty 1

## 2015-03-04 MED ORDER — ONDANSETRON 4 MG PO TBDP
4.0000 mg | ORAL_TABLET | Freq: Once | ORAL | Status: AC
  Administered 2015-03-04: 4 mg via ORAL

## 2015-03-04 MED ORDER — PHENAZOPYRIDINE HCL 200 MG PO TABS: 200.0000 mg | ORAL_TABLET | Freq: Three times a day (TID) | ORAL | Status: DC | PRN

## 2015-03-04 MED ORDER — CEPHALEXIN 500 MG PO CAPS
500.0000 mg | ORAL_CAPSULE | Freq: Three times a day (TID) | ORAL | Status: DC
Start: 2015-03-04 — End: 2016-05-01

## 2015-03-04 MED ORDER — ONDANSETRON HCL 4 MG PO TABS: 4.0000 mg | ORAL_TABLET | Freq: Three times a day (TID) | ORAL | Status: DC | PRN

## 2015-03-04 MED ORDER — OXYCODONE-ACETAMINOPHEN 5-325 MG PO TABS: 1.0000 | ORAL_TABLET | ORAL | Status: DC | PRN

## 2015-03-04 MED ORDER — CEPHALEXIN 500 MG PO CAPS
ORAL_CAPSULE | ORAL | Status: AC
  Administered 2015-03-04: 500 mg via ORAL
  Filled 2015-03-04: qty 1

## 2015-03-04 MED ORDER — CEPHALEXIN 500 MG PO CAPS
500.0000 mg | ORAL_CAPSULE | Freq: Once | ORAL | Status: AC
  Administered 2015-03-04: 500 mg via ORAL

## 2015-03-04 NOTE — ED Notes (Signed)
Patient presents for painful urination and noticed frank blood in the urine last night but states that has subsided tonight. States he has right inguinal pain, low back pain and denies flank pain. States when he starts the stream of urine very little comes out.

## 2015-03-04 NOTE — Discharge Instructions (Signed)
1. Take antibiotic as prescribed (Keflex 500 mg 3 times daily 7 days). 2. Take pain and nausea medicines as needed (Percocet/Zofran #10). 3. Take urinary pain medicine as prescribed (Pyridium #6). 4. Return to the ER for worsening symptoms, persistent vomiting, difficulty breathing or other concerns.  Urinary Tract Infection Urinary tract infections (UTIs) can develop anywhere along your urinary tract. Your urinary tract is your body's drainage system for removing wastes and extra water. Your urinary tract includes two kidneys, two ureters, a bladder, and a urethra. Your kidneys are a pair of bean-shaped organs. Each kidney is about the size of your fist. They are located below your ribs, one on each side of your spine. CAUSES Infections are caused by microbes, which are microscopic organisms, including fungi, viruses, and bacteria. These organisms are so small that they can only be seen through a microscope. Bacteria are the microbes that most commonly cause UTIs. SYMPTOMS  Symptoms of UTIs may vary by age and gender of the patient and by the location of the infection. Symptoms in young women typically include a frequent and intense urge to urinate and a painful, burning feeling in the bladder or urethra during urination. Older women and men are more likely to be tired, shaky, and weak and have muscle aches and abdominal pain. A fever may mean the infection is in your kidneys. Other symptoms of a kidney infection include pain in your back or sides below the ribs, nausea, and vomiting. DIAGNOSIS To diagnose a UTI, your caregiver will ask you about your symptoms. Your caregiver also will ask to provide a urine sample. The urine sample will be tested for bacteria and white blood cells. White blood cells are made by your body to help fight infection. TREATMENT  Typically, UTIs can be treated with medication. Because most UTIs are caused by a bacterial infection, they usually can be treated with the use  of antibiotics. The choice of antibiotic and length of treatment depend on your symptoms and the type of bacteria causing your infection. HOME CARE INSTRUCTIONS  If you were prescribed antibiotics, take them exactly as your caregiver instructs you. Finish the medication even if you feel better after you have only taken some of the medication.  Drink enough water and fluids to keep your urine clear or pale yellow.  Avoid caffeine, tea, and carbonated beverages. They tend to irritate your bladder.  Empty your bladder often. Avoid holding urine for long periods of time.  Empty your bladder before and after sexual intercourse.  After a bowel movement, women should cleanse from front to back. Use each tissue only once. SEEK MEDICAL CARE IF:   You have back pain.  You develop a fever.  Your symptoms do not begin to resolve within 3 days. SEEK IMMEDIATE MEDICAL CARE IF:   You have severe back pain or lower abdominal pain.  You develop chills.  You have nausea or vomiting.  You have continued burning or discomfort with urination. MAKE SURE YOU:   Understand these instructions.  Will watch your condition.  Will get help right away if you are not doing well or get worse. Document Released: 03/06/2005 Document Revised: 11/26/2011 Document Reviewed: 07/05/2011 [facility] Patient Information 2015 [place], [place]. This information is not intended to replace advice given to you by your health care provider. Make sure you discuss any questions you have with your health care provider.  Abdominal Pain Many things can cause abdominal pain. Usually, abdominal pain is not caused by a disease and  will improve without treatment. It can often be observed and treated at home. Your health care provider will do a physical exam and possibly order blood tests and X-rays to help determine the seriousness of your pain. However, in many cases, more time must pass before a clear cause of the pain can be found.  Before that point, your health care provider may not know if you need more testing or further treatment. HOME CARE INSTRUCTIONS  Monitor your abdominal pain for any changes. The following actions may help to alleviate any discomfort you are experiencing:  Only take over-the-counter or prescription medicines as directed by your health care provider.  Do not take laxatives unless directed to do so by your health care provider.  Try a clear liquid diet (broth, tea, or water) as directed by your health care provider. Slowly move to a bland diet as tolerated. SEEK MEDICAL CARE IF:  You have unexplained abdominal pain.  You have abdominal pain associated with nausea or diarrhea.  You have pain when you urinate or have a bowel movement.  You experience abdominal pain that wakes you in the night.  You have abdominal pain that is worsened or improved by eating food.  You have abdominal pain that is worsened with eating fatty foods.  You have a fever. SEEK IMMEDIATE MEDICAL CARE IF:   Your pain does not go away within 2 hours.  You keep throwing up (vomiting).  Your pain is felt only in portions of the abdomen, such as the right side or the left lower portion of the abdomen.  You pass bloody or black tarry stools. MAKE SURE YOU:  Understand these instructions.   Will watch your condition.   Will get help right away if you are not doing well or get worse.  Document Released: 03/06/2005 Document Revised: 06/01/2013 Document Reviewed: 02/03/2013 [facility] Patient Information 2015 [place], [place]. This information is not intended to replace advice given to you by your health care provider. Make sure you discuss any questions you have with your health care provider.  Hematuria Hematuria is blood in your urine. It can be caused by a bladder infection, kidney infection, prostate infection, kidney stone, or cancer of your urinary tract. Infections can usually be treated with medicine, and  a kidney stone usually will pass through your urine. If neither of these is the cause of your hematuria, further workup to find out the reason may be needed. It is very important that you tell your health care provider about any blood you see in your urine, even if the blood stops without treatment or happens without causing pain. Blood in your urine that happens and then stops and then happens again can be a symptom of a very serious condition. Also, pain is not a symptom in the initial stages of many urinary cancers. HOME CARE INSTRUCTIONS   Drink lots of fluid, 3-4 quarts a day. If you have been diagnosed with an infection, cranberry juice is especially recommended, in addition to large amounts of water.  Avoid caffeine, tea, and carbonated beverages because they tend to irritate the bladder.  Avoid alcohol because it may irritate the prostate.  Take all medicines as directed by your health care provider.  If you were prescribed an antibiotic medicine, finish it all even if you start to feel better.  If you have been diagnosed with a kidney stone, follow your health care provider's instructions regarding straining your urine to catch the stone.  Empty your bladder often. Avoid  holding urine for long periods of time.  After a bowel movement, women should cleanse front to back. Use each tissue only once.  Empty your bladder before and after sexual intercourse if you are a male. SEEK MEDICAL CARE IF:  You develop back pain.  You have a fever.  You have a feeling of sickness in your stomach (nausea) or vomiting.  Your symptoms are not better in 3 days. Return sooner if you are getting worse. SEEK IMMEDIATE MEDICAL CARE IF:   You develop severe vomiting and are unable to keep the medicine down.  You develop severe back or abdominal pain despite taking your medicines.  You begin passing a large amount of blood or clots in your urine.  You feel extremely weak or faint, or you pass  out. MAKE SURE YOU:   Understand these instructions.  Will watch your condition.  Will get help right away if you are not doing well or get worse. Document Released: 05/27/2005 Document Revised: 10/11/2013 Document Reviewed: 01/25/2013 [facility] Patient Information 2015 [place], [place]. This information is not intended to replace advice given to you by your health care provider. Make sure you discuss any questions you have with your health care provider.

## 2015-03-04 NOTE — ED Notes (Signed)
Pt reports right lower abd/groin pain x 1 day.  Pt reports intermittent blood in urine over the past month, and now more constant.  Pt was on antibiotic cipro from PCP for awhile, bleeding/pain resolved but returned today.  Pt reports having CT by PCP which showed no urinary problems.  Pt reports fever yesterday but afebrile at triage.  Pt reports nausea.  Pt NAD at this time.

## 2015-03-04 NOTE — ED Provider Notes (Signed)
[facility] Emergency Department Provider Note  ____________________________________________  Time seen: Approximately 4:42 AM  I have reviewed the triage vital signs and the nursing notes.   HISTORY  Chief Complaint Dysuria    HPI Jason Davidson is a 34 y.o. male who presents to the ED from home with a chief complaint of painful, frequent urination associated with bloody urine and lower abdominal pain. Patient has had the symptoms for several weeks, seen by his PCP at [facility] with negative CT scan earlier this month and is currently awaiting urology appointment. Reports fever yesterday but none today.Denies chest pain, shortness of breath, vomiting, diarrhea. States he feels like he has to void every 5 minutes or so. Denies testicular pain or swelling. Denies penile discharge. No prior history of kidney stones.   Past Medical History  Diagnosis Date  . Diabetes mellitus without complication   . Depressed     Patient Active Problem List   Diagnosis Date Noted  . Depressed     History reviewed. No pertinent past surgical history.  Current Outpatient Rx  Name  Route  Sig  Dispense  Refill  . ALPRAZolam (XANAX) 0.5 MG tablet   Oral   Take 0.5 mg by mouth at bedtime as needed for anxiety.         . citalopram (CELEXA) 40 MG tablet   Oral   Take 40 mg by mouth daily.         Marland Kitchen dicyclomine (BENTYL) 10 MG capsule   Oral   Take 10 mg by mouth 3 (three) times daily before meals.         . gabapentin (NEURONTIN) 100 MG capsule   Oral   Take 100 mg by mouth 3 (three) times daily.         Marland Kitchen glipiZIDE (GLUCOTROL) 10 MG tablet   Oral   Take 10 mg by mouth daily before breakfast.         . insulin glargine (LANTUS) 100 UNIT/ML injection   Subcutaneous   Inject 50 Units into the skin at bedtime.          . pantoprazole (PROTONIX) 40 MG tablet   Oral   Take 40 mg by mouth daily.         . ranitidine (ZANTAC) 150 MG capsule   Oral  Take 150 mg by mouth 2 (two) times daily.         . SUMAtriptan (IMITREX) 100 MG tablet   Oral   Take 100 mg by mouth every 2 (two) hours as needed for migraine. May repeat in 2 hours if headache persists or recurs.         . topiramate (TOPAMAX) 50 MG tablet   Oral   Take 50 mg by mouth 2 (two) times daily.         Marland Kitchen ibuprofen (ADVIL,MOTRIN) 800 MG tablet   Oral   Take 1 tablet (800 mg total) by mouth every 8 (eight) hours as needed.   30 tablet   0   . oxyCODONE-acetaminophen (ROXICET) 5-325 MG per tablet   Oral   Take 1-2 tablets by mouth every 4 (four) hours as needed for severe pain.   15 tablet   0     Allergies Erythromycin; Penicillins; and Sulfa antibiotics  Family history None for renal cell carcinoma   Social History Social History  Substance Use Topics  . Smoking status: Never Smoker   . Smokeless tobacco: None  . Alcohol Use: No  Review of Systems Constitutional: Positive for subjective fever yesterday. Eyes: No visual changes. ENT: No sore throat. Cardiovascular: Denies chest pain. Respiratory: Denies shortness of breath. Gastrointestinal: Positive for abdominal pain.  Positive for nausea, no vomiting.  No diarrhea.  No constipation. Genitourinary: Positive for dysuria. Musculoskeletal: Negative for back pain. Skin: Negative for rash. Neurological: Negative for headaches, focal weakness or numbness.  10-point ROS otherwise negative.  ____________________________________________   PHYSICAL EXAM:  VITAL SIGNS: ED Triage Vitals  Enc Vitals Group     BP 03/04/15 0029 118/74 mmHg     Pulse Rate 03/04/15 0029 95     Resp 03/04/15 0029 18     Temp 03/04/15 0029 98.4 F (36.9 C)     Temp Source 03/04/15 0029 Oral     SpO2 03/04/15 0029 95 %     Weight 03/04/15 0029 413 lb (187.336 kg)     Height 03/04/15 0029 5\' 11"  (1.803 m)     Head Cir --      Peak Flow --      Pain Score 03/04/15 0030 8     Pain Loc --      Pain Edu? --       Excl. in GC? --     Constitutional: Alert and oriented. Well appearing and in no acute distress. Eyes: Conjunctivae are normal. PERRL. EOMI. Head: Atraumatic. Nose: No congestion/rhinnorhea. Mouth/Throat: Mucous membranes are moist.  Oropharynx non-erythematous. Neck: No stridor.   Cardiovascular: Normal rate, regular rhythm. Grossly normal heart sounds.  Good peripheral circulation. Respiratory: Normal respiratory effort.  No retractions. Lungs CTAB. Gastrointestinal: Obese. Soft and minimally tender to palpation suprapubic area without rebound or guarding. No distention. No abdominal bruits. No CVA tenderness. Musculoskeletal: No lower extremity tenderness nor edema.  No joint effusions. Neurologic:  Normal speech and language. No gross focal neurologic deficits are appreciated. No gait instability. Skin:  Skin is warm, dry and intact. No rash noted. Psychiatric: Mood and affect are normal. Speech and behavior are normal.  ____________________________________________   LABS (all labs ordered are listed, but only abnormal results are displayed)  Labs Reviewed  URINALYSIS COMPLETEWITH MICROSCOPIC ([facility] ONLY) - Abnormal; Notable for the following:    Color, Urine YELLOW (*)    APPearance HAZY (*)    Hgb urine dipstick 1+ (*)    Protein, ur 30 (*)    Leukocytes, UA 2+ (*)    Bacteria, UA MANY (*)    Squamous Epithelial / LPF 0-5 (*)    All other components within normal limits   ____________________________________________  EKG  None ____________________________________________  RADIOLOGY  None ____________________________________________   PROCEDURES  Procedure(s) performed: None  Critical Care performed: No  ____________________________________________   INITIAL IMPRESSION / ASSESSMENT AND PLAN / ED COURSE  Pertinent labs & imaging results that were available during my care of the patient were reviewed by me and considered in my medical decision making (see  chart for details).  34 year old male with UTI, hematuria and lower abdominal pain with recent negative CT scan and pending urology follow-up. Patient was on Cipro previously; will switch to Keflex which patient has taken previously without adverse reaction. Will add urine culture. Bladder scan to assess postvoid residual.  ----------------------------------------- 5:15 AM on 03/04/2015 -----------------------------------------  Bladder scan only reveals <MEASUREMEChristell Const<MEASUREMENTChristell Const<MEASUREMENTChristell Const<MEASUREMENTChristell Const<MEASUREMENTChristell Const<MEASUREMENTChristell Constant> in bladder. Strict return precautions given. Patient to follow up with urology at [facility]. Patient and spouse verbalize understanding and agree with plan of care. ____________________________________________   FINAL CLINICAL IMPRESSION(S) / ED DIAGNOSES  Final diagnoses:  UTI (lower urinary tract infection)  Hematuria  Lower abdominal pain      Irean Hong, MD 03/04/15 [phone]

## 2015-03-04 NOTE — ED Notes (Signed)
Bladder scan shows in bladder

## 2015-04-05 ENCOUNTER — Other Ambulatory Visit: Payer: Self-pay | Admitting: Urology

## 2015-04-05 DIAGNOSIS — R319 Hematuria, unspecified: Secondary | ICD-10-CM

## 2015-04-11 ENCOUNTER — Ambulatory Visit: Payer: Medicaid Other

## 2015-04-17 ENCOUNTER — Ambulatory Visit
Admission: RE | Admit: 2015-04-17 | Discharge: 2015-04-17 | Disposition: A | Discharge status: Home / Self Care | Payer: Medicaid Other | Source: Ambulatory Visit | Attending: Urology | Admitting: Urology

## 2015-04-17 DIAGNOSIS — R319 Hematuria, unspecified: Secondary | ICD-10-CM | POA: Insufficient documentation

## 2015-05-02 ENCOUNTER — Encounter: Payer: Self-pay | Admitting: Emergency Medicine

## 2015-05-02 ENCOUNTER — Ambulatory Visit
Admission: EM | Admit: 2015-05-02 | Discharge: 2015-05-02 | Disposition: A | Discharge status: Home / Self Care | Payer: Medicaid Other | Attending: Family Medicine | Admitting: Family Medicine

## 2015-05-02 DIAGNOSIS — J411 Mucopurulent chronic bronchitis: Secondary | ICD-10-CM | POA: Diagnosis not present

## 2015-05-02 DIAGNOSIS — J9801 Acute bronchospasm: Secondary | ICD-10-CM

## 2015-05-02 MED ORDER — AZITHROMYCIN 250 MG PO TABS: ORAL_TABLET | ORAL | Status: DC

## 2015-05-02 MED ORDER — FLUTICASONE-SALMETEROL 230-21 MCG/ACT IN AERO: 2.0000 | INHALATION_SPRAY | Freq: Two times a day (BID) | RESPIRATORY_TRACT | Status: DC

## 2015-05-02 NOTE — ED Notes (Signed)
Patient states he has a bad cough and hurts to take a deep breath

## 2015-05-02 NOTE — ED Provider Notes (Signed)
CSN: [id number]     Arrival date & time 05/02/15  1811 History   First MD Initiated Contact with Patient 05/02/15 1922     Chief Complaint  Patient presents with  . Cough   (Consider location/radiation/quality/duration/timing/severity/associated sxs/prior Treatment) HPI Comments: 34 yo diabetic male with a 1 week h/o productive cough and chest wall pain when coughing. Has also noticed some mild wheezing, worse at night.   The history is provided by the patient.    Past Medical History  Diagnosis Date  . Diabetes mellitus without complication (HCC)   . Depressed    History reviewed. No pertinent past surgical history. History reviewed. No pertinent family history. Social History  Substance Use Topics  . Smoking status: Never Smoker   . Smokeless tobacco: None  . Alcohol Use: No    Review of Systems  Allergies  Erythromycin; Penicillins; and Sulfa antibiotics  Home Medications   Prior to Admission medications   Medication Sig Start Date End Date Taking? Authorizing Provider  ALPRAZolam Prudy Feeler(XANAX) 0.5 MG tablet Take 0.5 mg by mouth at bedtime as needed for anxiety.   Yes Historical Provider, MD  citalopram (CELEXA) 40 MG tablet Take 40 mg by mouth daily.   Yes Historical Provider, MD  dicyclomine (BENTYL) 10 MG capsule Take 10 mg by mouth 3 (three) times daily before meals.   Yes Historical Provider, MD  gabapentin (NEURONTIN) 100 MG capsule Take 100 mg by mouth 3 (three) times daily.   Yes Historical Provider, MD  glipiZIDE (GLUCOTROL) 10 MG tablet Take 10 mg by mouth daily before breakfast.   Yes Historical Provider, MD  insulin glargine (LANTUS) 100 UNIT/ML injection Inject 50 Units into the skin at bedtime.    Yes Historical Provider, MD  ondansetron (ZOFRAN) 4 MG tablet Take 1 tablet (4 mg total) by mouth every 8 (eight) hours as needed for nausea or vomiting. 03/04/15  Yes Irean HongJade J Sung, MD  pantoprazole (PROTONIX) 40 MG tablet Take 40 mg by mouth daily.   Yes Historical  Provider, MD  ranitidine (ZANTAC) 150 MG capsule Take 150 mg by mouth 2 (two) times daily.   Yes Historical Provider, MD  SUMAtriptan (IMITREX) 100 MG tablet Take 100 mg by mouth every 2 (two) hours as needed for migraine. May repeat in 2 hours if headache persists or recurs.   Yes Historical Provider, MD  topiramate (TOPAMAX) 50 MG tablet Take 50 mg by mouth 2 (two) times daily.   Yes Historical Provider, MD  azithromycin (ZITHROMAX Z-PAK) 250 MG tablet 2 tabs po once day 1, then 1 tab po qd for next 4 days 05/02/15   Payton Mccallumrlando Laurencia Roma, MD  cephALEXin (KEFLEX) 500 MG capsule Take 1 capsule (500 mg total) by mouth 3 (three) times daily. 03/04/15   Irean HongJade J Sung, MD  fluticasone-salmeterol (ADVAIR HFA) [phone] MCG/ACT inhaler Inhale 2 puffs into the lungs 2 (two) times daily. 05/02/15   Payton Mccallumrlando Kazi Montoro, MD  ibuprofen (ADVIL,MOTRIN) 800 MG tablet Take 1 tablet (800 mg total) by mouth every 8 (eight) hours as needed. 02/10/15   Darci Currentandolph N Brown, MD  oxyCODONE-acetaminophen (ROXICET) 5-325 MG per tablet Take 1 tablet by mouth every 4 (four) hours as needed for severe pain. 03/04/15   Irean HongJade J Sung, MD  phenazopyridine (PYRIDIUM) 200 MG tablet Take 1 tablet (200 mg total) by mouth 3 (three) times daily as needed for pain. 03/04/15   Irean HongJade J Sung, MD   Meds Ordered and Administered this Visit  Medications - No data  to display  BP 120/64 mmHg  Pulse 72  Temp(Src) 98.4 F (36.9 C) (Tympanic)  Resp 18  Ht  (1.803 m)  Wt 423 lb (191.872 kg)  BMI 59.02 kg/m2  SpO2 97% No data found.   Physical Exam  Constitutional: He appears well-developed and well-nourished. No distress.  HENT:  Head: Normocephalic and atraumatic.  Right Ear: Tympanic membrane, external ear and ear canal normal.  Left Ear: Tympanic membrane, external ear and ear canal normal.  Nose: Nose normal.  Mouth/Throat: Uvula is midline, oropharynx is clear and moist and mucous membranes are normal. No oropharyngeal exudate or tonsillar  abscesses.  Eyes: Conjunctivae and EOM are normal. Pupils are equal, round, and reactive to light. Right eye exhibits no discharge. Left eye exhibits no discharge. No scleral icterus.  Neck: Normal range of motion. Neck supple. No tracheal deviation present. No thyromegaly present.  Cardiovascular: Normal rate, regular rhythm and normal heart sounds.   Pulmonary/Chest: Effort normal. No stridor. No respiratory distress. He has wheezes (mild diffuse expiratory wheezes and rhonchi). He has no rales. He exhibits no tenderness.  Lymphadenopathy:    He has no cervical adenopathy.  Neurological: He is alert.  Skin: Skin is warm and dry. No rash noted. He is not diaphoretic.  Nursing note and vitals reviewed.   ED Course  Procedures (including critical care time)  Labs Review Labs Reviewed - No data to display  Imaging Review No results found.   Visual Acuity Review  Right Eye Distance:   Left Eye Distance:   Bilateral Distance:    Right Eye Near:   Left Eye Near:    Bilateral Near:         MDM   1. Bronchitis, mucopurulent recurrent (HCC)   2. Bronchospasm    Discharge Medication List as of 05/02/2015  7:40 PM    START taking these medications   Details  azithromycin (ZITHROMAX Z-PAK) 250 MG tablet 2 tabs po once day 1, then 1 tab po qd for next 4 days, Normal    fluticasone-salmeterol (ADVAIR HFA) 230-21 MCG/ACT inhaler Inhale 2 puffs into the lungs 2 (two) times daily., Starting 05/02/2015, Until Discontinued, Normal       1. x-ray results and diagnosis reviewed with patient 2. rx as per orders above; reviewed possible side effects, interactions, risks and benefits  3. Recommend supportive treatment with rest, increased fluids, otc analgesics prn 4. Follow-up prn if symptoms worsen or don't improve    Payton Mccallum, MD 06/01/15 1726

## 2015-05-15 DIAGNOSIS — L089 Local infection of the skin and subcutaneous tissue, unspecified: Secondary | ICD-10-CM | POA: Insufficient documentation

## 2015-06-12 ENCOUNTER — Encounter: Payer: Self-pay | Admitting: Emergency Medicine

## 2015-06-12 ENCOUNTER — Ambulatory Visit
Admission: EM | Admit: 2015-06-12 | Discharge: 2015-06-12 | Disposition: A | Discharge status: Home / Self Care | Payer: Medicaid Other | Attending: Family Medicine | Admitting: Family Medicine

## 2015-06-12 DIAGNOSIS — J0100 Acute maxillary sinusitis, unspecified: Secondary | ICD-10-CM | POA: Diagnosis not present

## 2015-06-12 MED ORDER — AZITHROMYCIN 250 MG PO TABS: ORAL_TABLET | ORAL | Status: DC

## 2015-06-12 NOTE — ED Notes (Signed)
Patient c/o sinus pressure and pain and nasal congestion and ear pain for a week.

## 2015-06-12 NOTE — ED Provider Notes (Signed)
CSN: [id number]     Arrival date & time 06/12/15  1412 History   First MD Initiated Contact with Patient 06/12/15 1631     Chief Complaint  Patient presents with  . Facial Pain  . Nasal Congestion  . Otalgia   (Consider location/radiation/quality/duration/timing/severity/associated sxs/prior Treatment) Patient is a 35 y.o. male presenting with URI. The history is provided by the patient.  URI Presenting symptoms: congestion, cough, ear pain, facial pain, fatigue and fever   Severity:  Moderate Onset quality:  Sudden Duration:  1 week Timing:  Constant Progression:  Worsening Chronicity:  New Relieved by:  Nothing Ineffective treatments:  OTC medications Associated symptoms: headaches and sinus pain   Risk factors: chronic respiratory disease and sick contacts     Past Medical History  Diagnosis Date  . Diabetes mellitus without complication (HCC)   . Depressed    History reviewed. No pertinent past surgical history. History reviewed. No pertinent family history. Social History  Substance Use Topics  . Smoking status: Never Smoker   . Smokeless tobacco: None  . Alcohol Use: No    Review of Systems  Constitutional: Positive for fever and fatigue.  HENT: Positive for congestion and ear pain.   Respiratory: Positive for cough.   Neurological: Positive for headaches.    Allergies  Erythromycin; Penicillins; and Sulfa antibiotics  Home Medications   Prior to Admission medications   Medication Sig Start Date End Date Taking? Authorizing Provider  ALPRAZolam Prudy Feeler(XANAX) 0.5 MG tablet Take 0.5 mg by mouth at bedtime as needed for anxiety.    Historical Provider, MD  azithromycin (ZITHROMAX Z-PAK) 250 MG tablet 2 tabs po once day 1, then 1 tab po qd for next 4 days 06/12/15   Payton Mccallumrlando Gerry Heaphy, MD  cephALEXin (KEFLEX) 500 MG capsule Take 1 capsule (500 mg total) by mouth 3 (three) times daily. 03/04/15   Irean HongJade J Sung, MD  citalopram (CELEXA) 40 MG tablet Take 40 mg by mouth daily.     Historical Provider, MD  dicyclomine (BENTYL) 10 MG capsule Take 10 mg by mouth 3 (three) times daily before meals.    Historical Provider, MD  fluticasone-salmeterol (ADVAIR HFA) 230-21 MCG/ACT inhaler Inhale 2 puffs into the lungs 2 (two) times daily. 05/02/15   Payton Mccallumrlando Sharell Hilmer, MD  gabapentin (NEURONTIN) 100 MG capsule Take 100 mg by mouth 3 (three) times daily.    Historical Provider, MD  glipiZIDE (GLUCOTROL) 10 MG tablet Take 10 mg by mouth daily before breakfast.    Historical Provider, MD  ibuprofen (ADVIL,MOTRIN) 800 MG tablet Take 1 tablet (800 mg total) by mouth every 8 (eight) hours as needed. 02/10/15   Darci Currentandolph N Brown, MD  insulin glargine (LANTUS) 100 UNIT/ML injection Inject 50 Units into the skin at bedtime.     Historical Provider, MD  ondansetron (ZOFRAN) 4 MG tablet Take 1 tablet (4 mg total) by mouth every 8 (eight) hours as needed for nausea or vomiting. 03/04/15   Irean HongJade J Sung, MD  oxyCODONE-acetaminophen (ROXICET) 5-325 MG per tablet Take 1 tablet by mouth every 4 (four) hours as needed for severe pain. 03/04/15   Irean HongJade J Sung, MD  pantoprazole (PROTONIX) 40 MG tablet Take 40 mg by mouth daily.    Historical Provider, MD  phenazopyridine (PYRIDIUM) 200 MG tablet Take 1 tablet (200 mg total) by mouth 3 (three) times daily as needed for pain. 03/04/15   Irean HongJade J Sung, MD  ranitidine (ZANTAC) 150 MG capsule Take 150 mg by mouth 2 (two)  times daily.    Historical Provider, MD  SUMAtriptan (IMITREX) 100 MG tablet Take 100 mg by mouth every 2 (two) hours as needed for migraine. May repeat in 2 hours if headache persists or recurs.    Historical Provider, MD  topiramate (TOPAMAX) 50 MG tablet Take 50 mg by mouth 2 (two) times daily.    Historical Provider, MD   Meds Ordered and Administered this Visit  Medications - No data to display  BP 150/89 mmHg  Pulse 98  Temp(Src) 98.3 F (36.8 C) (Tympanic)  Resp 18  Ht 5\' 11"  (1.803 m)  Wt 421 lb (190.964 kg)  BMI 58.74 kg/m2  SpO2 98% No  data found.   Physical Exam  Constitutional: He appears well-developed and well-nourished. No distress.  HENT:  Head: Normocephalic and atraumatic.  Right Ear: Tympanic membrane, external ear and ear canal normal.  Left Ear: Tympanic membrane, external ear and ear canal normal.  Nose: Mucosal edema and rhinorrhea present. Right sinus exhibits maxillary sinus tenderness and frontal sinus tenderness. Left sinus exhibits maxillary sinus tenderness and frontal sinus tenderness.  Mouth/Throat: Uvula is midline and mucous membranes are normal. Posterior oropharyngeal erythema present. No oropharyngeal exudate, posterior oropharyngeal edema or tonsillar abscesses.  Eyes: Conjunctivae and EOM are normal. Pupils are equal, round, and reactive to light. Right eye exhibits no discharge. Left eye exhibits no discharge. No scleral icterus.  Neck: Normal range of motion. Neck supple. No tracheal deviation present. No thyromegaly present.  Cardiovascular: Normal rate, regular rhythm and normal heart sounds.   Pulmonary/Chest: Effort normal and breath sounds normal. No stridor. No respiratory distress. He has no wheezes. He has no rales. He exhibits no tenderness.  Lymphadenopathy:    He has no cervical adenopathy.  Neurological: He is alert.  Skin: Skin is warm and dry. No rash noted. He is not diaphoretic.  Nursing note and vitals reviewed.   ED Course  Procedures (including critical care time)  Labs Review Labs Reviewed - No data to display  Imaging Review No results found.   Visual Acuity Review  Right Eye Distance:   Left Eye Distance:   Bilateral Distance:    Right Eye Near:   Left Eye Near:    Bilateral Near:         MDM   1. Acute maxillary sinusitis, recurrence not specified    New Prescriptions   AZITHROMYCIN (ZITHROMAX Z-PAK) 250 MG TABLET    2 tabs po once day 1, then 1 tab po qd for next 4 days   1.  diagnosis reviewed with patient 2. rx as per orders above;  reviewed possible side effects, interactions, risks and benefits  3. Recommend supportive treatment with rest, increased fluids, otc flonase 4. Follow-up prn if symptoms worsen or don't improve    Payton Mccallum, MD 06/12/15 [phone]

## 2015-07-05 ENCOUNTER — Ambulatory Visit: Payer: Medicaid Other

## 2015-07-05 ENCOUNTER — Ambulatory Visit
Admission: EM | Admit: 2015-07-05 | Discharge: 2015-07-05 | Disposition: A | Discharge status: Home / Self Care | Payer: Medicaid Other | Attending: Family Medicine | Admitting: Family Medicine

## 2015-07-05 ENCOUNTER — Encounter: Payer: Self-pay | Admitting: Emergency Medicine

## 2015-07-05 DIAGNOSIS — J40 Bronchitis, not specified as acute or chronic: Secondary | ICD-10-CM | POA: Diagnosis not present

## 2015-07-05 DIAGNOSIS — R51 Headache: Secondary | ICD-10-CM | POA: Diagnosis present

## 2015-07-05 DIAGNOSIS — R079 Chest pain, unspecified: Secondary | ICD-10-CM | POA: Diagnosis present

## 2015-07-05 DIAGNOSIS — J0110 Acute frontal sinusitis, unspecified: Secondary | ICD-10-CM | POA: Insufficient documentation

## 2015-07-05 DIAGNOSIS — M940 Chondrocostal junction syndrome [Tietze]: Secondary | ICD-10-CM

## 2015-07-05 DIAGNOSIS — R05 Cough: Secondary | ICD-10-CM | POA: Diagnosis present

## 2015-07-05 MED ORDER — PREDNISONE 10 MG PO TABS: ORAL_TABLET | ORAL | Status: DC

## 2015-07-05 MED ORDER — LEVOFLOXACIN 750 MG PO TABS: 750.0000 mg | ORAL_TABLET | Freq: Every day | ORAL | Status: DC

## 2015-07-05 MED ORDER — BENZONATATE 100 MG PO CAPS: 100.0000 mg | ORAL_CAPSULE | Freq: Three times a day (TID) | ORAL | Status: DC | PRN

## 2015-07-05 MED ORDER — GUAIFENESIN-CODEINE 100-10 MG/5ML PO SOLN: 10.0000 mL | Freq: Every evening | ORAL | Status: DC | PRN

## 2015-07-05 MED ORDER — GUAIFENESIN-CODEINE 100-10 MG/5ML PO SOLN: 10.0000 mL | Freq: Three times a day (TID) | ORAL | Status: DC | PRN

## 2015-07-05 MED ORDER — IPRATROPIUM-ALBUTEROL 0.5-2.5 (3) MG/3ML IN SOLN
3.0000 mL | Freq: Four times a day (QID) | RESPIRATORY_TRACT | Status: DC
Start: 2015-07-05 — End: 2015-07-05
  Administered 2015-07-05: 3 mL via RESPIRATORY_TRACT

## 2015-07-05 NOTE — Discharge Instructions (Signed)
Take medication as prescribed. Rest. Eat and drink well. Monitor your blood sugar closely and adjust according as directed by your primary care physician. Use albuterol inhaler at home as needed for wheezing.  Follow up this week with your primary care physician. Return to Urgent care or proceed to ER for chest pain, shortness of breath, fevers, for new or worsening concerns.   Sinusitis, Adult Sinusitis is redness, soreness, and inflammation of the paranasal sinuses. Paranasal sinuses are air pockets within the bones of your face. They are located beneath your eyes, in the middle of your forehead, and above your eyes. In healthy paranasal sinuses, mucus is able to drain out, and air is able to circulate through them by way of your nose. However, when your paranasal sinuses are inflamed, mucus and air can become trapped. This can allow bacteria and other germs to grow and cause infection. Sinusitis can develop quickly and last only a short time (acute) or continue over a long period (chronic). Sinusitis that lasts for more than 12 weeks is considered chronic. CAUSES Causes of sinusitis include:  Allergies.  Structural abnormalities, such as displacement of the cartilage that separates your nostrils (deviated septum), which can decrease the air flow through your nose and sinuses and affect sinus drainage.  Functional abnormalities, such as when the small hairs (cilia) that line your sinuses and help remove mucus do not work properly or are not present. SIGNS AND SYMPTOMS Symptoms of acute and chronic sinusitis are the same. The primary symptoms are pain and pressure around the affected sinuses. Other symptoms include:  Upper toothache.  Earache.  Headache.  Bad breath.  Decreased sense of smell and taste.  A cough, which worsens when you are lying flat.  Fatigue.  Fever.  Thick drainage from your nose, which often is green and may contain pus (purulent).  Swelling and warmth over  the affected sinuses. DIAGNOSIS Your health care provider will perform a physical exam. During your exam, your health care provider may perform any of the following to help determine if you have acute sinusitis or chronic sinusitis:  Look in your nose for signs of abnormal growths in your nostrils (nasal polyps).  Tap over the affected sinus to check for signs of infection.  View the inside of your sinuses using an imaging device that has a light attached (endoscope). If your health care provider suspects that you have chronic sinusitis, one or more of the following tests may be recommended:  Allergy tests.  Nasal culture. A sample of mucus is taken from your nose, sent to a lab, and screened for bacteria.  Nasal cytology. A sample of mucus is taken from your nose and examined by your health care provider to determine if your sinusitis is related to an allergy. TREATMENT Most cases of acute sinusitis are related to a viral infection and will resolve on their own within 10 days. Sometimes, medicines are prescribed to help relieve symptoms of both acute and chronic sinusitis. These may include pain medicines, decongestants, nasal steroid sprays, or saline sprays. However, for sinusitis related to a bacterial infection, your health care provider will prescribe antibiotic medicines. These are medicines that will help kill the bacteria causing the infection. Rarely, sinusitis is caused by a fungal infection. In these cases, your health care provider will prescribe antifungal medicine. For some cases of chronic sinusitis, surgery is needed. Generally, these are cases in which sinusitis recurs more than 3 times per year, despite other treatments. HOME CARE INSTRUCTIONS  Drink plenty of water. Water helps thin the mucus so your sinuses can drain more easily.  Use a humidifier.  Inhale steam 3-4 times a day (for example, sit in the bathroom with the shower running).  Apply a warm, moist washcloth  to your face 3-4 times a day, or as directed by your health care provider.  Use saline nasal sprays to help moisten and clean your sinuses.  Take medicines only as directed by your health care provider.  If you were prescribed either an antibiotic or antifungal medicine, finish it all even if you start to feel better. SEEK IMMEDIATE MEDICAL CARE IF:  You have increasing pain or severe headaches.  You have nausea, vomiting, or drowsiness.  You have swelling around your face.  You have vision problems.  You have a stiff neck.  You have difficulty breathing.   This information is not intended to replace advice given to you by your health care provider. Make sure you discuss any questions you have with your health care provider.   Document Released: 05/27/2005 Document Revised: 06/17/2014 Document Reviewed: 06/11/2011 Elsevier Interactive Patient Education 2016 [employer].  Costochondritis Costochondritis is a condition in which the tissue (cartilage) that connects your ribs with your breastbone (sternum) becomes irritated. It causes pain in the chest and rib area. It usually goes away on its own over time. HOME CARE  Avoid activities that wear you out.  Do not strain your ribs. Avoid activities that use your:  Chest.  Belly.  Side muscles.  Put ice on the area for the first 2 days after the pain starts.  Put ice in a plastic bag.  Place a towel between your skin and the bag.  Leave the ice on for 20 minutes, 2-3 times a day.  Only take medicine as told by your doctor. GET HELP IF:  You have redness or puffiness (swelling) in the rib area.  Your pain does not go away with rest or medicine. GET HELP RIGHT AWAY IF:   Your pain gets worse.  You are very uncomfortable.  You have trouble breathing.  You cough up blood.  You start sweating or throwing up (vomiting).  You have a fever or lasting symptoms for more than 2-3 days.  You have a fever and your  symptoms suddenly get worse. MAKE SURE YOU:   Understand these instructions.  Will watch your condition.  Will get help right away if you are not doing well or get worse.   This information is not intended to replace advice given to you by your health care provider. Make sure you discuss any questions you have with your health care provider.   Document Released: 11/13/2007 Document Revised: 01/27/2013 Document Reviewed: 12/29/2012 Elsevier Interactive Patient Education [employer]

## 2015-07-05 NOTE — ED Provider Notes (Signed)
[facility]  ____________________________________________  Time seen: Approximately 7:07 PM  I have reviewed the triage vital signs and the nursing notes.   HISTORY  Chief Complaint Chest Pain; Cough; and Headache   HPI Jason Davidson is a 35 y.o. male presents for complaints of 3-4 weeks of runny nose, nasal congestion, cough, chest congestion and wheezing. Patient presenting seen in urgent care approximately 2-3 weeks ago and diagnosed with sinusitis and bronchitis. Patient reports that time he was treated with azithromycin and albuterol inhaler. Patient states at the completion antibiotic, he did feel somewhat better. However, reports the nasal congestion, runny nose never fully resolved, as well as the cough continued and worsened. Patient states that the cough is biggest complaint.  States productive drainage with blowing nose, states cough is nonproductive. Patient reports that he has been wheezing and home albuterol inhaler helps. Patient states that occasionally he'll have some shortness of breath with active cough and active wheezing. In absence of cough and wheezing, denies shortness of breath. Denies current shortness of breath. States "sometimes chest is sore from coughing," and reports only present with cough or touch. Reports continues to eat and drink well. Denies known fevers. Patient reports that his wife at home with similar.  Patient denies active shortness of breath at this time. Denies chest pain. Denies pleuritic chest pain, palpitations, syncope, near syncope, dizziness, weakness, fevers. Denies neck or back pain, rash, abdominal pain or dysuria. Denies extremity pain or swelling.   Patient reports taken multiple over-the-counter cough and cold congestion medications without resolution.  PCP: Lilian Kapur   Past Medical History  Diagnosis Date  . Diabetes mellitus without complication (HCC)   . Depressed   Obesity  Patient Active Problem List   Diagnosis Date Noted  . Depressed     History reviewed. No pertinent past surgical history.  Current Outpatient Rx  Name  Route  Sig  Dispense  Refill  . Fluticasone-Salmeterol (ADVAIR) 500-50 MCG/DOSE AEPB   Inhalation   Inhale 1 puff into the lungs 2 (two) times daily.         Marland Kitchen ALPRAZolam (XANAX) 0.5 MG tablet   Oral   Take 0.5 mg by mouth at bedtime as needed for anxiety.         Marland Kitchen azithromycin (ZITHROMAX Z-PAK) 250 MG tablet      2 tabs po once day 1, then 1 tab po qd for next 4 days   6 each   0   . cephALEXin (KEFLEX) 500 MG capsule   Oral   Take 1 capsule (500 mg total) by mouth 3 (three) times daily.   21 capsule   0   . citalopram (CELEXA) 40 MG tablet   Oral   Take 40 mg by mouth daily.         Marland Kitchen dicyclomine (BENTYL) 10 MG capsule   Oral   Take 10 mg by mouth 3 (three) times daily before meals.         . fluticasone-salmeterol (ADVAIR HFA) 230-21 MCG/ACT inhaler   Inhalation   Inhale 2 puffs into the lungs 2 (two) times daily.   1 Inhaler   0   . gabapentin (NEURONTIN) 100 MG capsule   Oral   Take 100 mg by mouth 3 (three) times daily.         Marland Kitchen glipiZIDE (GLUCOTROL) 10 MG tablet   Oral   Take 10 mg by mouth daily before breakfast.                    .  insulin glargine (LANTUS) 100 UNIT/ML injection   Subcutaneous   Inject 50 Units into the skin at bedtime.          .           .           . pantoprazole (PROTONIX) 40 MG tablet   Oral   Take 40 mg by mouth daily.         . phenazopyridine (PYRIDIUM) 200 MG tablet   Oral   Take 1 tablet (200 mg total) by mouth 3 (three) times daily as needed for pain.   6 tablet   0   . ranitidine (ZANTAC) 150 MG capsule   Oral   Take 150 mg by mouth 2 (two) times daily.         . SUMAtriptan (IMITREX) 100 MG tablet   Oral   Take 100 mg by mouth every 2 (two) hours as needed for migraine. May repeat in 2 hours if headache persists or recurs.         . topiramate (TOPAMAX) 50 MG  tablet   Oral   Take 50 mg by mouth 2 (two) times daily.           Allergies Erythromycin; Penicillins; and Sulfa antibiotics  History reviewed. No pertinent family history.  Social History Social History  Substance Use Topics  . Smoking status: Never Smoker   . Smokeless tobacco: None  . Alcohol Use: No    Review of Systems Constitutional: No fever/chills Eyes: No visual changes. ENT: No sore throat. Positive runny nose, nasal congestion, sinus pressure and sinus drainage. Cardiovascular: Denies chest pain. Respiratory: Denies shortness of breath. Gastrointestinal: No abdominal pain.  No nausea, no vomiting.  No diarrhea.  No constipation. Genitourinary: Negative for dysuria. Musculoskeletal: Negative for back pain. Skin: Negative for rash. Neurological: Negative for headaches, focal weakness or numbness.  10-point ROS otherwise negative.  ____________________________________________   PHYSICAL EXAM:  VITAL SIGNS: ED Triage Vitals  Enc Vitals Group     BP 07/05/15 1832 133/72 mmHg     Pulse Rate 07/05/15 1832 86     Resp 07/05/15 1832 18     Temp 07/05/15 1832 98.9 F (37.2 C)     Temp Source 07/05/15 1832 Oral     SpO2 07/05/15 1832 97 %     Weight 07/05/15 1832 413 lb (187.336 kg)     Height 07/05/15 1832  (1.803 m)     Head Cir --      Peak Flow --      Pain Score 07/05/15 1836 8     Pain Loc --      Pain Edu? --      Excl. in GC? --      Constitutional: Alert and oriented. Well appearing and in no acute distress. Eyes: Conjunctivae are normal. PERRL. EOMI. Head: Atraumatic.moderate tenderness to palpation bilateral frontal sinuses and and mild tenderness to palpation bilateral maxillary sinuses. No swelling. No erythema.   Ears: no erythema, normal TMs bilaterally.   Nose: nasal congestion with bilateral nasal turbinate erythema and edema.   Mouth/Throat: Mucous membranes are moist.  Oropharynx non-erythematous.No tonsillar swelling or  exudate.  Neck: No stridor.  No cervical spine tenderness to palpation. Hematological/Lymphatic/Immunilogical: No cervical lymphadenopathy. Cardiovascular: Normal rate, regular rhythm. Grossly normal heart sounds.  Good peripheral circulation. Bilateral chest para sternal anterior chest mild TTP over 3-5 anterior ribs, no swelling or ecchymosis, no crepitus, no palpable deformity.  Respiratory: Normal  respiratory effort.  Scattered rhonchi and inspiratory wheezes. Good air movement. Dry intermittent cough noted. Speaks in complete sentences at rest as well as with ambulation. Gastrointestinal: Soft and nontender.Obese abdomen. Normal Bowel sounds. No CVA tenderness. Musculoskeletal: No lower or upper extremity tenderness nor edema.  Bilateral pedal pulses equal and easily palpated. Bilateral calf nontender. No cervical, thoracic or lumbar tenderness to palpation.  Neurologic:  Normal speech and language. No gross focal neurologic deficits are appreciated. No gait instability. Skin:  Skin is warm, dry and intact. No rash noted. Psychiatric: Mood and affect are normal. Speech and behavior are normal. ____________________________________________   LABS (all labs ordered are listed, but only abnormal results are displayed)  Labs Reviewed - No data to display ____________________________________________  EKG  ED ECG REPORT I, Renford Dills, the attending provider, and Dr Judd Gaudier personally viewed and interpreted this ECG.   Date: 07/05/2015  EKG Time: 1927  Rate: 75  Rhythm: normal EKG, normal sinus rhythm, unchanged from previous tracings. Compared to previous EKG 11/30/13  Intervals:none  ST&T Change: none  ____________________________________________  RADIOLOGY  EXAM: CHEST 2 VIEW  COMPARISON: PA and lateral chest 11/30/2013.  FINDINGS: The lungs are clear. Heart size is normal. There is no pneumothorax or pleural effusion. No focal bony abnormality.  IMPRESSION: Negative  chest.   Electronically Signed By: Drusilla Kanner M.D. On: 07/05/2015 19:44  I, Renford Dills, personally viewed and evaluated these images (plain radiographs) as part of my medical decision making, as well as reviewing the written report by the radiologist.   INITIAL IMPRESSION / ASSESSMENT AND PLAN / ED COURSE  Pertinent labs & imaging results that were available during my care of the patient were reviewed by me and considered in my medical decision making (see chart for details).  Discussed patient and plan of care with Dr Judd Gaudier who also reviewed patient and agreed with plan of care.   Very well-appearing patient. No acute distress. Presents with spouse at bedside. Reports that the complaints of 3-4 weeks of runny nose, nasal congestion, sinus pressure, sinus drainage and cough with wheezing. Patient reports that he does occasionally have some chest tightness and shortness of breath with active cough and active wheezing. States in absence of wheezing, then no shortness of breath. Patient does also report that he does have some tenderness to his chest, reports that this tenderness is directly reproducible with palpation and feels consistent with pain reported. Reports pain fully reproducible with palpation. Denies other chest pain. Patient denies current chest pain.  Patient with scattered rhonchi as well as scattered inspiratory wheezes. Patient also with frontal moderate tenderness to palpation, bilateral nasal turbinate erythema and edema with purulent nasal drainage. Concern for unresolved sinusitis after Z-Pak as well as with continued cough and congestion will evaluate chest xray.   Chest x-ray reviewed.per radiologist chest x-ray negative.  Albuterol ipratropium nebulizer treatment 1  in urgent care. Patient reexamined post nebulizer treatment.Post treatment, reports feeling much improved as well as wheezes fully resolved.Denies chest pain or shortness of breath.   Suspect  unresolved sinusitis as well as continued bronchitis. As patient recently treated with Z-Pak without resolution. Will treat patient with oral Levaquin, prednisone taper, continue home albuterol inhaler as needed, Tessalon Perles during the day as well as when necessary guaifenesin with codeine at night. Counseled regarding very close PCP follow-up. Also counseled the patient as patient is a diabetic to closely monitor glucose as a glucose likely be elevated due to antibiotic use as well as prednisone.  Discussed follow up with Primary care physician this week. Discussed follow up and return parameters to Urgent care or proceed to ER for chest pain, shortness of breath, weakness, fevers, no resolution or any worsening concerns. Patient verbalized understanding and agreed to plan.    ____________________________________________   FINAL CLINICAL IMPRESSION(S) / ED DIAGNOSES  Final diagnoses:  Acute frontal sinusitis, recurrence not specified  Bronchitis  Costochondritis      Note: This dictation was prepared with Dragon dictation along with smaller phrase technology. Any transcriptional errors that result from this process are unintentional.    Renford Dills, NP 07/06/15 1316  Renford Dills, NP 07/06/15 1317

## 2015-07-05 NOTE — ED Notes (Signed)
Patient c/o cough, chest congestion, SOB and chest pressure for a month.  Patient denies fevers.

## 2015-08-31 ENCOUNTER — Emergency Department: Payer: Medicaid Other

## 2015-08-31 ENCOUNTER — Emergency Department
Admission: EM | Admit: 2015-08-31 | Discharge: 2015-08-31 | Disposition: A | Discharge status: Home / Self Care | Payer: Medicaid Other | Attending: Emergency Medicine | Admitting: Emergency Medicine

## 2015-08-31 ENCOUNTER — Encounter: Payer: Self-pay | Admitting: Emergency Medicine

## 2015-08-31 DIAGNOSIS — Z7984 Long term (current) use of oral hypoglycemic drugs: Secondary | ICD-10-CM | POA: Insufficient documentation

## 2015-08-31 DIAGNOSIS — E119 Type 2 diabetes mellitus without complications: Secondary | ICD-10-CM | POA: Insufficient documentation

## 2015-08-31 DIAGNOSIS — Y939 Activity, unspecified: Secondary | ICD-10-CM | POA: Diagnosis not present

## 2015-08-31 DIAGNOSIS — Z79899 Other long term (current) drug therapy: Secondary | ICD-10-CM | POA: Diagnosis not present

## 2015-08-31 DIAGNOSIS — X58XXXA Exposure to other specified factors, initial encounter: Secondary | ICD-10-CM | POA: Insufficient documentation

## 2015-08-31 DIAGNOSIS — Y999 Unspecified external cause status: Secondary | ICD-10-CM | POA: Diagnosis not present

## 2015-08-31 DIAGNOSIS — Y929 Unspecified place or not applicable: Secondary | ICD-10-CM | POA: Diagnosis not present

## 2015-08-31 DIAGNOSIS — M25552 Pain in left hip: Secondary | ICD-10-CM | POA: Diagnosis present

## 2015-08-31 DIAGNOSIS — F329 Major depressive disorder, single episode, unspecified: Secondary | ICD-10-CM | POA: Diagnosis not present

## 2015-08-31 DIAGNOSIS — Z792 Long term (current) use of antibiotics: Secondary | ICD-10-CM | POA: Diagnosis not present

## 2015-08-31 DIAGNOSIS — Z7951 Long term (current) use of inhaled steroids: Secondary | ICD-10-CM | POA: Diagnosis not present

## 2015-08-31 DIAGNOSIS — Z794 Long term (current) use of insulin: Secondary | ICD-10-CM | POA: Diagnosis not present

## 2015-08-31 DIAGNOSIS — S7002XA Contusion of left hip, initial encounter: Secondary | ICD-10-CM

## 2015-08-31 DIAGNOSIS — Z7952 Long term (current) use of systemic steroids: Secondary | ICD-10-CM | POA: Diagnosis not present

## 2015-08-31 MED ORDER — OXYCODONE-ACETAMINOPHEN 5-325 MG PO TABS: 1.0000 | ORAL_TABLET | Freq: Four times a day (QID) | ORAL | Status: DC | PRN

## 2015-08-31 MED ORDER — IBUPROFEN 800 MG PO TABS: 800.0000 mg | ORAL_TABLET | Freq: Three times a day (TID) | ORAL | Status: DC | PRN

## 2015-08-31 MED ORDER — OXYCODONE-ACETAMINOPHEN 5-325 MG PO TABS
ORAL_TABLET | ORAL | Status: AC
  Administered 2015-08-31: 1 via ORAL
  Filled 2015-08-31: qty 1

## 2015-08-31 MED ORDER — OXYCODONE-ACETAMINOPHEN 5-325 MG PO TABS
1.0000 | ORAL_TABLET | Freq: Once | ORAL | Status: AC
  Administered 2015-08-31: 1 via ORAL

## 2015-08-31 NOTE — ED Provider Notes (Signed)
[facility] Emergency Department Provider Note  ____________________________________________  Time seen: Approximately 9:21 PM  I have reviewed the triage vital signs and the nursing notes.   HISTORY  Chief Complaint Hip Pain    HPI Jason Davidson is a 35 y.o. male who developed pain in his left hip region yesterday. Noticed bruising to the lateral hip without known trauma. He has pain with walking and rotation of the hip joint. No abdominal pain or knee pain. No pain into the buttocks.   Past Medical History  Diagnosis Date  . Diabetes mellitus without complication (HCC)   . Depressed     Patient Active Problem List   Diagnosis Date Noted  . Depressed     History reviewed. No pertinent past surgical history.  Current Outpatient Rx  Name  Route  Sig  Dispense  Refill  . ALPRAZolam (XANAX) 0.5 MG tablet   Oral   Take 0.5 mg by mouth at bedtime as needed for anxiety.         Marland Kitchen azithromycin (ZITHROMAX Z-PAK) 250 MG tablet      2 tabs po once day 1, then 1 tab po qd for next 4 days   6 each   0   . benzonatate (TESSALON PERLES) 100 MG capsule   Oral   Take 1 capsule (100 mg total) by mouth 3 (three) times daily as needed for cough.   15 capsule   0   . cephALEXin (KEFLEX) 500 MG capsule   Oral   Take 1 capsule (500 mg total) by mouth 3 (three) times daily.   21 capsule   0   . citalopram (CELEXA) 40 MG tablet   Oral   Take 40 mg by mouth daily.         Marland Kitchen dicyclomine (BENTYL) 10 MG capsule   Oral   Take 10 mg by mouth 3 (three) times daily before meals.         . fluticasone-salmeterol (ADVAIR HFA) 230-21 MCG/ACT inhaler   Inhalation   Inhale 2 puffs into the lungs 2 (two) times daily.   1 Inhaler   0   . Fluticasone-Salmeterol (ADVAIR) 500-50 MCG/DOSE AEPB   Inhalation   Inhale 1 puff into the lungs 2 (two) times daily.         Marland Kitchen gabapentin (NEURONTIN) 100 MG capsule   Oral   Take 100 mg by mouth 3 (three)  times daily.         Marland Kitchen glipiZIDE (GLUCOTROL) 10 MG tablet   Oral   Take 10 mg by mouth daily before breakfast.         . guaiFENesin-codeine 100-10 MG/5ML syrup   Oral   Take 10 mLs by mouth at bedtime as needed for cough.   75 mL   0   . ibuprofen (ADVIL,MOTRIN) 800 MG tablet   Oral   Take 1 tablet (800 mg total) by mouth every 8 (eight) hours as needed.   30 tablet   0   . ibuprofen (ADVIL,MOTRIN) 800 MG tablet   Oral   Take 1 tablet (800 mg total) by mouth every 8 (eight) hours as needed.   15 tablet   0   . insulin glargine (LANTUS) 100 UNIT/ML injection   Subcutaneous   Inject 50 Units into the skin at bedtime.          Marland Kitchen levofloxacin (LEVAQUIN) 750 MG tablet   Oral   Take 1 tablet (750 mg total) by  mouth daily.   5 tablet   0   . ondansetron (ZOFRAN) 4 MG tablet   Oral   Take 1 tablet (4 mg total) by mouth every 8 (eight) hours as needed for nausea or vomiting.   10 tablet   0   . oxyCODONE-acetaminophen (ROXICET) 5-325 MG per tablet   Oral   Take 1 tablet by mouth every 4 (four) hours as needed for severe pain.   10 tablet   0   . oxyCODONE-acetaminophen (ROXICET) 5-325 MG tablet   Oral   Take 1 tablet by mouth every 6 (six) hours as needed.   20 tablet   0   . pantoprazole (PROTONIX) 40 MG tablet   Oral   Take 40 mg by mouth daily.         . phenazopyridine (PYRIDIUM) 200 MG tablet   Oral   Take 1 tablet (200 mg total) by mouth 3 (three) times daily as needed for pain.   6 tablet   0   . predniSONE (DELTASONE) 10 MG tablet      Start 60 mg po day one, then 50 mg po day two, taper by 10 mg daily until complete.   21 tablet   0   . ranitidine (ZANTAC) 150 MG capsule   Oral   Take 150 mg by mouth 2 (two) times daily.         . SUMAtriptan (IMITREX) 100 MG tablet   Oral   Take 100 mg by mouth every 2 (two) hours as needed for migraine. May repeat in 2 hours if headache persists or recurs.         . topiramate (TOPAMAX) 50 MG  tablet   Oral   Take 50 mg by mouth 2 (two) times daily.           Allergies Erythromycin; Penicillins; and Sulfa antibiotics  No family history on file.  Social History Social History  Substance Use Topics  . Smoking status: Never Smoker   . Smokeless tobacco: None  . Alcohol Use: No    Review of Systems Constitutional: No fever/chills Eyes: No visual changes. ENT: No sore throat. Cardiovascular: Denies chest pain. Respiratory: Denies shortness of breath. Gastrointestinal: No abdominal pain.  No nausea, no vomiting.  No diarrhea.  No constipation. Genitourinary: Negative for dysuria. Musculoskeletal: Negative for back pain. Skin: Negative for rash. Neurological: Negative for headaches, focal weakness or numbness. 10-point ROS otherwise negative.  ____________________________________________   PHYSICAL EXAM:  VITAL SIGNS: ED Triage Vitals  Enc Vitals Group     BP 08/31/15 2036 136/77 mmHg     Pulse Rate 08/31/15 2036 95     Resp 08/31/15 2036 16     Temp 08/31/15 2036 98.2 F (36.8 C)     Temp Source 08/31/15 2036 Oral     SpO2 08/31/15 2036 95 %     Weight 08/31/15 2036 413 lb (187.336 kg)     Height 08/31/15 2036  (1.803 m)     Head Cir --      Peak Flow --      Pain Score 08/31/15 2036 8     Pain Loc --      Pain Edu? --      Excl. in GC? --     Constitutional: Alert and oriented. Well appearing and in no acute distress. Eyes: Conjunctivae are normal. PERRL. EOMI. Ears:  Clear with normal landmarks. No erythema. Head: Atraumatic. Nose: No congestion/rhinnorhea. Mouth/Throat: Mucous membranes are  moist.  Oropharynx non-erythematous. No lesions. Neck:  Supple.  No adenopathy.   Cardiovascular: Normal rate, regular rhythm. Grossly normal heart sounds.  Good peripheral circulation. Respiratory: Normal respiratory effort.  No retractions. Lungs CTAB. Gastrointestinal: Soft and nontender. No distention. No abdominal bruits. No CVA  tenderness. Musculoskeletal: Pain with range of motion of the left hip. Tender over the left greater trochanter. Neurologic:  Normal speech and language. No gross focal neurologic deficits are appreciated. No gait instability. Skin:  Skin is warm, dry and intact. No rash noted. Area of ecchymosis to the left lateral trochanter region. Psychiatric: Mood and affect are normal. Speech and behavior are normal.  ____________________________________________   LABS (all labs ordered are listed, but only abnormal results are displayed)  Labs Reviewed - No data to display ____________________________________________  EKG    ____________________________________________  RADIOLOGY  CLINICAL DATA: Left hip pain for 1 day. Bruising. No known injury.  EXAM: DG HIP (WITH OR WITHOUT PELVIS) 2-3V LEFT  COMPARISON: None.  FINDINGS: There is no evidence of hip fracture or dislocation. There is no evidence of arthropathy or other focal bone abnormality.  IMPRESSION: Negative.   Electronically Signed  By: Burman NievesWilliam Stevens M.D.  On: 08/31/2015 21:54  ____________________________________________   PROCEDURES  Procedure(s) performed: None  Critical Care performed: No  ____________________________________________   INITIAL IMPRESSION / ASSESSMENT AND PLAN / ED COURSE  Pertinent labs & imaging results that were available during my care of the patient were reviewed by me and considered in my medical decision making (see chart for details).  35 year old male with 2 days of left lateral hip pain with ecchymosis. Consistent with a contusion or possible strain. Encouraged ibuprofen and given Percocet if needed. He'll follow-up with orthopedics if not improving. ____________________________________________   FINAL CLINICAL IMPRESSION(S) / ED DIAGNOSES  Final diagnoses:  Contusion, hip, left, initial encounter      Ignacia BayleyRobert Natane Heward, PA-C 08/31/15 2210  Loleta Roseory Forbach,  MD 09/01/15 1125

## 2015-08-31 NOTE — ED Notes (Signed)
C/o painful left hip x 1 day.

## 2015-08-31 NOTE — ED Notes (Signed)
Pt discharged to home.  Family member driving.  Discharge instructions reviewed.  Verbalized understanding.  No questions or concerns at this time.  Teach back verified.  Pt in NAD.  No items left in ED.   

## 2015-08-31 NOTE — Discharge Instructions (Signed)
Contusion A contusion is a deep bruise. Contusions are the result of a blunt injury to tissues and muscle fibers under the skin. The injury causes bleeding under the skin. The skin overlying the contusion may turn blue, purple, or yellow. Minor injuries will give you a painless contusion, but more severe contusions may stay painful and swollen for a few weeks.  CAUSES  This condition is usually caused by a blow, trauma, or direct force to an area of the body. SYMPTOMS  Symptoms of this condition include:  Swelling of the injured area.  Pain and tenderness in the injured area.  Discoloration. The area may have redness and then turn blue, purple, or yellow. DIAGNOSIS  This condition is diagnosed based on a physical exam and medical history. An X-ray, CT scan, or MRI may be needed to determine if there are any associated injuries, such as broken bones (fractures). TREATMENT  Specific treatment for this condition depends on what area of the body was injured. In general, the best treatment for a contusion is resting, icing, applying pressure to (compression), and elevating the injured area. This is often called the RICE strategy. Over-the-counter anti-inflammatory medicines may also be recommended for pain control.  HOME CARE INSTRUCTIONS   Rest the injured area.  If directed, apply ice to the injured area:  Put ice in a plastic bag.  Place a towel between your skin and the bag.  Leave the ice on for 20 minutes, 2-3 times per day.  If directed, apply light compression to the injured area using an elastic bandage. Make sure the bandage is not wrapped too tightly. Remove and reapply the bandage as directed by your health care provider.  If possible, raise (elevate) the injured area above the level of your heart while you are sitting or lying down.  Take over-the-counter and prescription medicines only as told by your health care provider. SEEK MEDICAL CARE IF:  Your symptoms do not  improve after several days of treatment.  Your symptoms get worse.  You have difficulty moving the injured area. SEEK IMMEDIATE MEDICAL CARE IF:   You have severe pain.  You have numbness in a hand or foot.  Your hand or foot turns pale or cold.   This information is not intended to replace advice given to you by your health care provider. Make sure you discuss any questions you have with your health care provider.   Document Released: 03/06/2005 Document Revised: 02/15/2015 Document Reviewed: 10/12/2014 Elsevier Interactive Patient Education 2016 [employer]   Continue ice to the area. Take pain medicine and ibuprofen as directed for pain. Follow-up with the orthopedist if not improving.

## 2015-11-16 DIAGNOSIS — G8929 Other chronic pain: Secondary | ICD-10-CM | POA: Insufficient documentation

## 2015-11-16 DIAGNOSIS — M2550 Pain in unspecified joint: Secondary | ICD-10-CM

## 2015-11-19 ENCOUNTER — Emergency Department
Admission: EM | Admit: 2015-11-19 | Discharge: 2015-11-20 | Disposition: A | Discharge status: Home / Self Care | Payer: Medicaid Other | Attending: Emergency Medicine | Admitting: Emergency Medicine

## 2015-11-19 DIAGNOSIS — Z792 Long term (current) use of antibiotics: Secondary | ICD-10-CM | POA: Diagnosis not present

## 2015-11-19 DIAGNOSIS — Z7984 Long term (current) use of oral hypoglycemic drugs: Secondary | ICD-10-CM | POA: Diagnosis not present

## 2015-11-19 DIAGNOSIS — Z7952 Long term (current) use of systemic steroids: Secondary | ICD-10-CM | POA: Diagnosis not present

## 2015-11-19 DIAGNOSIS — F329 Major depressive disorder, single episode, unspecified: Secondary | ICD-10-CM | POA: Insufficient documentation

## 2015-11-19 DIAGNOSIS — Z79899 Other long term (current) drug therapy: Secondary | ICD-10-CM | POA: Diagnosis not present

## 2015-11-19 DIAGNOSIS — Z794 Long term (current) use of insulin: Secondary | ICD-10-CM | POA: Insufficient documentation

## 2015-11-19 DIAGNOSIS — E119 Type 2 diabetes mellitus without complications: Secondary | ICD-10-CM | POA: Diagnosis not present

## 2015-11-19 DIAGNOSIS — L539 Erythematous condition, unspecified: Secondary | ICD-10-CM | POA: Diagnosis present

## 2015-11-19 DIAGNOSIS — L089 Local infection of the skin and subcutaneous tissue, unspecified: Secondary | ICD-10-CM | POA: Diagnosis not present

## 2015-11-19 NOTE — ED Notes (Signed)
Has an abscess that popped approx one month ago, then came back and not getting better.

## 2015-11-20 LAB — MRSA PCR SCREENING: MRSA BY PCR: NEGATIVE

## 2015-11-20 MED ORDER — CLINDAMYCIN HCL 300 MG PO CAPS: 300.0000 mg | ORAL_CAPSULE | Freq: Four times a day (QID) | ORAL | Status: DC

## 2015-11-20 NOTE — ED Provider Notes (Signed)
[facility] Emergency Department Provider Note    ____________________________________________  Time seen: ~0000  I have reviewed the triage vital signs and the nursing notes.   HISTORY  Chief Complaint Abscess   History limited by: Not Limited   HPI Jason Davidson is a 35 y.o. male who presents to the emergency department today because of concerns for infection in the groin. He states that it started one month ago. At that time he had what sounds like an abscess. He describes pus coming out. Since that time he has had some irritation to the area. Today's wife noticed some blood coming from that area. He was planning on making an appointment with his primary care doctor next week [place] to that trip. He denies any fevers. No nausea or vomiting.   Past Medical History  Diagnosis Date  . Diabetes mellitus without complication (HCC)   . Depressed     Patient Active Problem List   Diagnosis Date Noted  . Depressed     No past surgical history on file.  Current Outpatient Rx  Name  Route  Sig  Dispense  Refill  . ALPRAZolam (XANAX) 0.5 MG tablet   Oral   Take 0.5 mg by mouth at bedtime as needed for anxiety.         Marland Kitchen. azithromycin (ZITHROMAX Z-PAK) 250 MG tablet      2 tabs po once day 1, then 1 tab po qd for next 4 days   6 each   0   . benzonatate (TESSALON PERLES) 100 MG capsule   Oral   Take 1 capsule (100 mg total) by mouth 3 (three) times daily as needed for cough.   15 capsule   0   . cephALEXin (KEFLEX) 500 MG capsule   Oral   Take 1 capsule (500 mg total) by mouth 3 (three) times daily.   21 capsule   0   . citalopram (CELEXA) 40 MG tablet   Oral   Take 40 mg by mouth daily.         Marland Kitchen. dicyclomine (BENTYL) 10 MG capsule   Oral   Take 10 mg by mouth 3 (three) times daily before meals.         . fluticasone-salmeterol (ADVAIR HFA) 230-21 MCG/ACT  inhaler   Inhalation   Inhale 2 puffs into the lungs 2 (two) times daily.   1 Inhaler   0   . Fluticasone-Salmeterol (ADVAIR) 500-50 MCG/DOSE AEPB   Inhalation   Inhale 1 puff into the lungs 2 (two) times daily.         Marland Kitchen. gabapentin (NEURONTIN) 100 MG capsule   Oral   Take 100 mg by mouth 3 (three) times daily.         Marland Kitchen. glipiZIDE (GLUCOTROL) 10 MG tablet   Oral   Take 10 mg by mouth daily before breakfast.         . guaiFENesin-codeine 100-10 MG/5ML syrup   Oral   Take 10 mLs by mouth at bedtime as needed for cough.   75 mL   0   . ibuprofen (ADVIL,MOTRIN) 800 MG tablet   Oral   Take 1 tablet (800 mg total) by mouth every 8 (eight) hours as needed.   30 tablet   0   . ibuprofen (ADVIL,MOTRIN) 800 MG tablet   Oral   Take 1 tablet (800 mg total) by mouth every 8 (  eight) hours as needed.   15 tablet   0   . insulin glargine (LANTUS) 100 UNIT/ML injection   Subcutaneous   Inject 50 Units into the skin at bedtime.          Marland Kitchen levofloxacin (LEVAQUIN) 750 MG tablet   Oral   Take 1 tablet (750 mg total) by mouth daily.   5 tablet   0   . ondansetron (ZOFRAN) 4 MG tablet   Oral   Take 1 tablet (4 mg total) by mouth every 8 (eight) hours as needed for nausea or vomiting.   10 tablet   0   . oxyCODONE-acetaminophen (ROXICET) 5-325 MG per tablet   Oral   Take 1 tablet by mouth every 4 (four) hours as needed for severe pain.   10 tablet   0   . oxyCODONE-acetaminophen (ROXICET) 5-325 MG tablet   Oral   Take 1 tablet by mouth every 6 (six) hours as needed.   20 tablet   0   . pantoprazole (PROTONIX) 40 MG tablet   Oral   Take 40 mg by mouth daily.         . phenazopyridine (PYRIDIUM) 200 MG tablet   Oral   Take 1 tablet (200 mg total) by mouth 3 (three) times daily as needed for pain.   6 tablet   0   . predniSONE (DELTASONE) 10 MG tablet      Start 60 mg po day one, then 50 mg po day two, taper by 10 mg daily until complete.   21 tablet   0    . ranitidine (ZANTAC) 150 MG capsule   Oral   Take 150 mg by mouth 2 (two) times daily.         . SUMAtriptan (IMITREX) 100 MG tablet   Oral   Take 100 mg by mouth every 2 (two) hours as needed for migraine. May repeat in 2 hours if headache persists or recurs.         . topiramate (TOPAMAX) 50 MG tablet   Oral   Take 50 mg by mouth 2 (two) times daily.           Allergies Erythromycin; Penicillins; and Sulfa antibiotics  No family history on file.  Social History Social History  Substance Use Topics  . Smoking status: Never Smoker   . Smokeless tobacco: Not on file  . Alcohol Use: No    Review of Systems  Constitutional: Negative for fever. Cardiovascular: Negative for chest pain. Respiratory: Negative for shortness of breath. Gastrointestinal: Negative for abdominal pain, vomiting and diarrhea. Neurological: Negative for headaches, focal weakness or numbness.  10-point ROS otherwise negative.  ____________________________________________   PHYSICAL EXAM:  VITAL SIGNS: ED Triage Vitals  Enc Vitals Group     BP 11/19/15 2204 148/64 mmHg     Pulse Rate 11/19/15 2204 79     Resp 11/19/15 2204 20     Temp 11/19/15 2204 98.1 F (36.7 C)     Temp Source 11/19/15 2204 Oral     SpO2 11/19/15 2204 97 %     Weight 11/19/15 2204 423 lb (191.872 kg)     Height 11/19/15 2204 5\' 11"  (1.803 m)     Head Cir --      Peak Flow --      Pain Score 11/19/15 2208 8   Constitutional: Alert and oriented. Well appearing and in no distress. Eyes: Conjunctivae are normal. PERRL. Normal extraocular movements. ENT  Head: Normocephalic and atraumatic.   Nose: No congestion/rhinnorhea.   Mouth/Throat: Mucous membranes are moist.   Neck: No stridor. Hematological/Lymphatic/Immunilogical: No cervical lymphadenopathy. Cardiovascular: Normal rate, regular rhythm.  No murmurs, rubs, or gallops. Respiratory: Normal respiratory effort without tachypnea nor  retractions. Breath sounds are clear and equal bilaterally. No wheezes/rales/rhonchi. Gastrointestinal: Soft and nontender. No distention. There is no CVA tenderness. Genitourinary: Deferred Musculoskeletal: Normal range of motion in all extremities. No joint effusions.  No lower extremity tenderness nor edema. Neurologic:  Normal speech and language. No gross focal neurologic deficits are appreciated.  Skin:  Small lesion to the right suprapubic area roughly half centimeter diameter. No fluctuance or induration. Some small amount of surrounding erythema to the lesion. No crepitus. Psychiatric: Mood and affect are normal. Speech and behavior are normal. Patient exhibits appropriate insight and judgment.  ____________________________________________    LABS (pertinent positives/negatives)  None  ____________________________________________   EKG  None  ____________________________________________    RADIOLOGY  None  ____________________________________________   PROCEDURES  Procedure(s) performed: None  Critical Care performed: No  ____________________________________________   INITIAL IMPRESSION / ASSESSMENT AND PLAN / ED COURSE  Pertinent labs & imaging results that were available during my care of the patient were reviewed by me and considered in my medical decision making (see chart for details).  Patient presented to the emergency department today because of concerns for lesion to the skin in his groin. This period is a very small skin infection and ulceration. No fluctuance to suggest abscess at this time. Will send culture.  Will plan on placing on antibiotics.  ____________________________________________   FINAL CLINICAL IMPRESSION(S) / ED DIAGNOSES  Final diagnoses:  Skin infection     Note: This dictation was prepared with Dragon dictation. Any transcriptional errors that result from this process are unintentional    Phineas Semen,  MD 11/20/15 [phone]

## 2015-11-20 NOTE — Discharge Instructions (Signed)
Please seek medical attention for any high fevers, chest pain, shortness of breath, change in behavior, persistent vomiting, bloody stool or any other new or concerning symptoms. ° ° °Cellulitis °Cellulitis is an infection of the skin and the tissue under the skin. The infected area is usually red and tender. This happens most often in the arms and lower legs. °HOME CARE  °· Take your antibiotic medicine as told. Finish the medicine even if you start to feel better. °· Keep the infected arm or leg raised (elevated). °· Put a warm cloth on the area up to 4 times per day. °· Only take medicines as told by your doctor. °· Keep all doctor visits as told. °GET HELP IF: °· You see red streaks on the skin coming from the infected area. °· Your red area gets bigger or turns a dark color. °· Your bone or joint under the infected area is painful after the skin heals. °· Your infection comes back in the same area or different area. °· You have a puffy (swollen) bump in the infected area. °· You have new symptoms. °· You have a fever. °GET HELP RIGHT AWAY IF:  °· You feel very sleepy. °· You throw up (vomit) or have watery poop (diarrhea). °· You feel sick and have muscle aches and pains. °  °This information is not intended to replace advice given to you by your health care provider. Make sure you discuss any questions you have with your health care provider. °  °Document Released: 11/13/2007 Document Revised: 02/15/2015 Document Reviewed: 08/12/2011 °Elsevier Interactive Patient Education ©2016 [employer] Inc[employer] ° °

## 2015-11-23 LAB — AEROBIC CULTURE W GRAM STAIN (SUPERFICIAL SPECIMEN): Culture: NORMAL

## 2015-11-23 LAB — AEROBIC CULTURE  (SUPERFICIAL SPECIMEN)

## 2015-12-31 DIAGNOSIS — Y801 Therapeutic (nonsurgical) and rehabilitative physical medicine devices associated with adverse incidents: Secondary | ICD-10-CM | POA: Insufficient documentation

## 2015-12-31 DIAGNOSIS — Z79899 Other long term (current) drug therapy: Secondary | ICD-10-CM | POA: Insufficient documentation

## 2015-12-31 DIAGNOSIS — Z792 Long term (current) use of antibiotics: Secondary | ICD-10-CM | POA: Diagnosis not present

## 2015-12-31 DIAGNOSIS — E119 Type 2 diabetes mellitus without complications: Secondary | ICD-10-CM | POA: Insufficient documentation

## 2015-12-31 DIAGNOSIS — R1031 Right lower quadrant pain: Secondary | ICD-10-CM | POA: Diagnosis not present

## 2015-12-31 DIAGNOSIS — Z794 Long term (current) use of insulin: Secondary | ICD-10-CM | POA: Insufficient documentation

## 2015-12-31 DIAGNOSIS — T8189XA Other complications of procedures, not elsewhere classified, initial encounter: Secondary | ICD-10-CM | POA: Insufficient documentation

## 2015-12-31 DIAGNOSIS — Z7984 Long term (current) use of oral hypoglycemic drugs: Secondary | ICD-10-CM | POA: Insufficient documentation

## 2016-01-01 ENCOUNTER — Emergency Department
Admission: EM | Admit: 2016-01-01 | Discharge: 2016-01-01 | Disposition: A | Discharge status: Home / Self Care | Payer: Medicaid Other | Attending: Emergency Medicine | Admitting: Emergency Medicine

## 2016-01-01 ENCOUNTER — Encounter: Payer: Self-pay | Admitting: Emergency Medicine

## 2016-01-01 DIAGNOSIS — T148XXA Other injury of unspecified body region, initial encounter: Secondary | ICD-10-CM

## 2016-01-01 DIAGNOSIS — R1031 Right lower quadrant pain: Secondary | ICD-10-CM

## 2016-01-01 NOTE — ED Notes (Signed)
Patient reports here little over a month ago with an area of infection at right groin.  Reports he took a course of clindamycin and follow up with his primary care MD and told area was healing.  Tonight after sitting on the toilet he noticed blood on his hand and right leg when checked area to right groin realized that was where the blood was coming from and wanted to have the area checked out.

## 2016-01-01 NOTE — Discharge Instructions (Signed)
Please keep gauze on wound and have your primary care physician re evaluate the area. The wound does not have any purulent drainage and the bleeding is controlled.

## 2016-01-01 NOTE — ED Provider Notes (Signed)
[facility] Emergency Department Provider Note   ____________________________________________  Time seen: Approximately 3:07 AM  I have reviewed the triage vital signs and the nursing notes.   HISTORY  Chief Complaint Wound Check    HPI Jason Davidson is a 35 y.o. male comes into the hospital today with some bleeding from his groin. The patient reports that he was here 3 months ago and had a place in his groin area that was infected. He reports he was called in clindamycin and follow up with his primary care physician. He reports that he was told it was improving so he didn't think much about it. The patient reports that tonight he had taken his medication and when she was a bathroom he noticed bleeding everywhere. He reports that from the same area at his groin. He reports it is still bleeding. He reports that the patient reports that he had no scab that was pulled off there. The patient rates his pain 8 out of 10 in intensity. He reports it wasn't hurting before started bleeding.   Past Medical History:  Diagnosis Date  . Depressed   . Diabetes mellitus without complication [facility])     Patient Active Problem List   Diagnosis Date Noted  . Depressed     History reviewed. No pertinent surgical history.  Current Outpatient Rx  . Order #: [id number] Class: Historical Med  . Order #: [id number] Class: Normal  . Order #: [id number] Class: Normal  . Order #: [id number] Class: Print  . Order #: [id number] Class: Historical Med  . Order #: [id number] Class: Print  . Order #: [id number] Class: Historical Med  . Order #: [id number] Class: Normal  . Order #: [id number] Class: Historical Med  . Order #: [id number] Class: Historical Med  . Order #: [id number] Class: Historical Med  . Order #: [id number] Class: Print  . Order #: [id number] Class: Print  . Order #: [id number] Class: Print  . Order #: [id number] Class: Historical Med  . Order #: [id number] Class: Normal  . Order #:  [id number] Class: Print  . Order #: [id number] Class: Print  . Order #: [id number] Class: Print  . Order #: [id number] Class: Historical Med  . Order #: [id number] Class: Print  . Order #: [id number] Class: Normal  . Order #: [id number] Class: Historical Med  . Order #: [id number] Class: Historical Med  . Order #: [id number] Class: Historical Med    Allergies Erythromycin; Penicillins; and Sulfa antibiotics  No family history on file.  Social History Social History  Substance Use Topics  . Smoking status: Never Smoker  . Smokeless tobacco: Never Used  . Alcohol use No    Review of Systems Constitutional: No fever/chills Eyes: No visual changes. ENT: No sore throat. Cardiovascular: Denies chest pain. Respiratory: Denies shortness of breath. Gastrointestinal: No abdominal pain.  No nausea, no vomiting.  No diarrhea.  No constipation. Genitourinary: Negative for dysuria. Musculoskeletal: Negative for back pain. Skin: bleeding from groin Neurological: Negative for headaches, focal weakness or numbness.  10-point ROS otherwise negative.  ____________________________________________   PHYSICAL EXAM:  VITAL SIGNS: ED Triage Vitals  Enc Vitals Group     BP 01/01/16 0002 (!) 147/77     Pulse Rate 01/01/16 0002 83     Resp 01/01/16 0002 20     Temp 01/01/16 0002 97.8 F (36.6 C)     Temp Source 01/01/16 0002 Oral     SpO2 01/01/16 0002 95 %     Weight 01/01/16 0002 (!) 423 lb (191.9 kg)     Height 01/01/16 0002  (1.803 m)  Head Circumference --      Peak Flow --      Pain Score 01/01/16 0003 8     Pain Loc --      Pain Edu? --      Excl. in GC? --     Constitutional: Alert and oriented. Well appearing and in mild distress. Eyes: Conjunctivae are normal. PERRL. EOMI. Head: Atraumatic. Nose: No congestion/rhinnorhea. Mouth/Throat: Mucous membranes are moist.  Oropharynx non-erythematous. Cardiovascular: Normal rate, regular rhythm. Grossly normal heart sounds.  Good  peripheral circulation. Respiratory: Normal respiratory effort.  No retractions. Lungs CTAB. Gastrointestinal: Soft and nontender. No distention. Positive bowel sounds Genitourinary: Bleeding hair follicle on the right groin no abscess noted no erythema mild tenderness to palpation Musculoskeletal: No lower extremity tenderness nor edema.  Neurologic:  Normal speech and language.  Skin:  Bleeding hair follicle on the right groin no abscess noted no erythema mild tenderness to palpation Psychiatric: Mood and affect are normal.   ____________________________________________   LABS (all labs ordered are listed, but only abnormal results are displayed)  Labs Reviewed - No data to display ____________________________________________  EKG  none ____________________________________________  RADIOLOGY  none ____________________________________________   PROCEDURES  Procedure(s) performed: None  Procedures  Critical Care performed: No  ____________________________________________   INITIAL IMPRESSION / ASSESSMENT AND PLAN / ED COURSE  Pertinent labs & imaging results that were available during my care of the patient were reviewed by me and considered in my medical decision making (see chart for details).  This is a 35 year old male who comes into the hospital today with the bleeding hair follicle in his right groin. I looked at the area and it does not show any signs of infection. There is no erythema there is no purulent drainage. When I squeeze the area there is some blood that comes out. He is not actively bleeding at this time. I will have the nurse put Surgicel on the area and then cover the wound and gauze. The patient should follow-up with his regular physician.  I don't feel that he needs antibiotics at this time. Told him that he should have it closely watched to determine if it develops an infection. Urged to  home. ____________________________________________   FINAL CLINICAL IMPRESSION(S) / ED DIAGNOSES  Final diagnoses:  Bleeding from wound (HCC)  Groin pain, right      NEW MEDICATIONS STARTED DURING THIS VISIT:  New Prescriptions   No medications on file     Note:  This document was prepared using Dragon voice recognition software and may include unintentional dictation errors.    Rebecka Apley, MD 01/01/16 0400

## 2016-01-01 NOTE — ED Triage Notes (Signed)
Patient reports that he had a wound to his right groin that was treated with antibiotics about a month ago. Patient reports that the wound did improve with antibiotics but did not completely heal. Patient reports that tonight he had a moderate amount of bleeding from the wound, no bleeding at this time.

## 2016-05-01 ENCOUNTER — Ambulatory Visit
Admission: EM | Admit: 2016-05-01 | Discharge: 2016-05-01 | Disposition: A | Discharge status: Home / Self Care | Payer: Medicaid Other | Attending: Emergency Medicine | Admitting: Emergency Medicine

## 2016-05-01 ENCOUNTER — Encounter: Payer: Self-pay | Admitting: *Deleted

## 2016-05-01 DIAGNOSIS — R0602 Shortness of breath: Secondary | ICD-10-CM | POA: Insufficient documentation

## 2016-05-01 DIAGNOSIS — R059 Cough, unspecified: Secondary | ICD-10-CM

## 2016-05-01 DIAGNOSIS — R05 Cough: Secondary | ICD-10-CM

## 2016-05-01 DIAGNOSIS — J209 Acute bronchitis, unspecified: Secondary | ICD-10-CM | POA: Insufficient documentation

## 2016-05-01 DIAGNOSIS — J029 Acute pharyngitis, unspecified: Secondary | ICD-10-CM | POA: Insufficient documentation

## 2016-05-01 DIAGNOSIS — R079 Chest pain, unspecified: Secondary | ICD-10-CM | POA: Insufficient documentation

## 2016-05-01 DIAGNOSIS — Z0001 Encounter for general adult medical examination with abnormal findings: Secondary | ICD-10-CM | POA: Diagnosis present

## 2016-05-01 DIAGNOSIS — R062 Wheezing: Secondary | ICD-10-CM | POA: Diagnosis not present

## 2016-05-01 LAB — RAPID STREP SCREEN (MED CTR MEBANE ONLY): STREPTOCOCCUS, GROUP A SCREEN (DIRECT): NEGATIVE

## 2016-05-01 MED ORDER — AEROCHAMBER PLUS MISC: 2 refills | Status: DC

## 2016-05-01 MED ORDER — IBUPROFEN 800 MG PO TABS: 800.0000 mg | ORAL_TABLET | Freq: Three times a day (TID) | ORAL | 0 refills | Status: DC | PRN

## 2016-05-01 MED ORDER — AZITHROMYCIN 250 MG PO TABS: 250.0000 mg | ORAL_TABLET | Freq: Every day | ORAL | 0 refills | Status: DC

## 2016-05-01 MED ORDER — PREDNISONE 50 MG PO TABS: 50.0000 mg | ORAL_TABLET | Freq: Every day | ORAL | 0 refills | Status: DC

## 2016-05-01 NOTE — ED Triage Notes (Signed)
Non-productive cough x1 month, sore throat x2 days. Denies other symptoms.

## 2016-05-01 NOTE — Discharge Instructions (Signed)
your rapid strep was negative today, so we have sent off a throat culture.  We will contact you and call in the appropriate antibiotics if your culture comes back positive for an infection requiring a change in your antibiotic treatment.  Give us a working phone number.  If you were given a prescription for antibiotics, you may want to wait and fill it until you know the results of the culture.  Tylenol and ibuprofen together as needed for pain.  Make sure you drink plenty of extra fluids.  Some people find salt water gargles and  Traditional Medicinal's "Throat Coat" tea helpful. Take 5 mL of liquid Benadryl and 5 mL of Maalox. Mix it together, and then hold it in your mouth for as long as you can and then swallow. You may do this 4 times a day.    2 puffs from your albuterol inhaler every 4-6 hours. Use the spacer. Adjust your insulin accordingly, the prednisone will make your sugars go up.  Go to www.goodrx.com to look up your medications. This will give you a list of where you can find your prescriptions at the most affordable prices.

## 2016-05-01 NOTE — ED Provider Notes (Signed)
HPI  SUBJECTIVE:  Jason Davidson is a 35 y.o. male who presents with a nonproductive cough for the past month. He now reports burning chest pain, wheezing and shortness of breath last night. Denies chest heaviness or pressure He has been taking Robitussin, cough drops, states he is compliant with his Advair, and states that he required his albuterol 5 times last night. He states that he normally takes it only as needed. Symptoms are better with albuterol, worse with change in weather. He has reports chest congestion and sore throat for the past several days. Denies voice changes, drooling, trismus, sensation of throat swelling shut. No nasal congestion, rhinorrhea, postnasal drip, states that his allergies and GERD are not bothering him at this point in time. no unintentional weight gain, orthopnea, nocturia, PND, lower extremity edema. Past medical history of GERD, allergies for which he takes medicines regularly, diabetes, takes insulin, bronchitis, asthma, OSA, morbid obesity, and depression. No history of CHF although he does take  "fluid pill" for his lower extremity edema. He is not a smoker. PMD: Dr. Jennye Moccasin at [facility] primary care    Past Medical History:  Diagnosis Date  . Depressed   . Diabetes mellitus without complication (HCC)     History reviewed. No pertinent surgical history.  History reviewed. No pertinent family history.  Social History  Substance Use Topics  . Smoking status: Never Smoker  . Smokeless tobacco: Never Used  . Alcohol use No    No current facility-administered medications for this encounter.   Current Outpatient Prescriptions:  .  ALPRAZolam (XANAX) 0.5 MG tablet, Take 0.5 mg by mouth at bedtime as needed for anxiety., Disp: , Rfl:  .  citalopram (CELEXA) 40 MG tablet, Take 40 mg by mouth daily., Disp: , Rfl:  .  dicyclomine (BENTYL) 10 MG capsule, Take 10 mg by mouth 3 (three) times daily before meals., Disp: , Rfl:  .   fluticasone-salmeterol (ADVAIR HFA) 230-21 MCG/ACT inhaler, Inhale 2 puffs into the lungs 2 (two) times daily., Disp: 1 Inhaler, Rfl: 0 .  Fluticasone-Salmeterol (ADVAIR) 500-50 MCG/DOSE AEPB, Inhale 1 puff into the lungs 2 (two) times daily., Disp: , Rfl:  .  gabapentin (NEURONTIN) 100 MG capsule, Take 100 mg by mouth 3 (three) times daily., Disp: , Rfl:  .  glipiZIDE (GLUCOTROL) 10 MG tablet, Take 10 mg by mouth daily before breakfast., Disp: , Rfl:  .  insulin glargine (LANTUS) 100 UNIT/ML injection, Inject 50 Units into the skin at bedtime. , Disp: , Rfl:  .  insulin regular (NOVOLIN R,HUMULIN R) 250 units/2.75mL (100 units/mL) injection, Inject into the skin 3 (three) times daily before meals., Disp: , Rfl:  .  ondansetron (ZOFRAN) 4 MG tablet, Take 1 tablet (4 mg total) by mouth every 8 (eight) hours as needed for nausea or vomiting., Disp: 10 tablet, Rfl: 0 .  pantoprazole (PROTONIX) 40 MG tablet, Take 40 mg by mouth daily., Disp: , Rfl:  .  ranitidine (ZANTAC) 150 MG capsule, Take 150 mg by mouth 2 (two) times daily., Disp: , Rfl:  .  SUMAtriptan (IMITREX) 100 MG tablet, Take 100 mg by mouth every 2 (two) hours as needed for migraine. May repeat in 2 hours if headache persists or recurs., Disp: , Rfl:  .  topiramate (TOPAMAX) 50 MG tablet, Take 50 mg by mouth 2 (two) times daily., Disp: , Rfl:  .  azithromycin (ZITHROMAX) 250 MG tablet, Take 1 tablet (250 mg total) by mouth daily. 2 tabs po on  day 1, 1 tab po on days 2-5, Disp: 6 tablet, Rfl: 0 .  ibuprofen (ADVIL,MOTRIN) 800 MG tablet, Take 1 tablet (800 mg total) by mouth every 8 (eight) hours as needed., Disp: 30 tablet, Rfl: 0 .  ibuprofen (ADVIL,MOTRIN) 800 MG tablet, Take 1 tablet (800 mg total) by mouth every 8 (eight) hours as needed., Disp: 30 tablet, Rfl: 0 .  predniSONE (DELTASONE) 50 MG tablet, Take 1 tablet (50 mg total) by mouth daily with breakfast., Disp: 5 tablet, Rfl: 0 .  Spacer/Aero-Holding Chambers (AEROCHAMBER PLUS)  inhaler, Use as instructed, Disp: 1 each, Rfl: 2  Allergies  Allergen Reactions  . Erythromycin Nausea And Vomiting  . Penicillins Swelling  . Sulfa Antibiotics Nausea And Vomiting     ROS  As noted in HPI.   Physical Exam  BP (!) 145/65 (BP Location: Left Arm)   Pulse 67   Temp 98.1 F (36.7 C) (Oral)   Resp 20   Ht 5\' 11"  (1.803 m)   Wt (!) 467 lb (211.8 kg)   SpO2 97%   BMI 65.13 kg/m   Constitutional: Well developed, well nourished, no acute distress. Morbidly obese Eyes:  EOMI, conjunctiva normal bilaterally HENT: Normocephalic, atraumatic,mucus membranes moist. No nasal congestion. No sinus tenderness. Slightly erythematous oropharynx, tonsillith with on the left side. Tonsils normal size, uvula midline. Respiratory: Normal inspiratory effort lungs clear bilaterally. Mild chest wall tenderness Cardiovascular: Normal rate regular rhythm no murmurs rubs or gallops  GI: nondistended skin: No rash, skin intact Musculoskeletal: no deformities, calves symmetric, nontender. Trace to 1+ edema bilateral lower extremities  Neurologic: Alert & oriented x 3, no focal neuro deficits Psychiatric: Speech and behavior appropriate   ED Course   Medications - No data to display  Orders Placed This Encounter  Procedures  . Rapid strep screen    Standing Status:   Standing    Number of Occurrences:   1    Order Specific Question:   Patient immune status    Answer:   Normal  . Culture, group A strep    Standing Status:   Standing    Number of Occurrences:   1    Results for orders placed or performed during the hospital encounter of 05/01/16 (from the past 24 hour(s))  Rapid strep screen     Status: None   Collection Time: 05/01/16 10:39 AM  Result Value Ref Range   Streptococcus, Group A Screen (Direct) NEGATIVE NEGATIVE   No results found.  ED Clinical Impression  Sore throat  Cough  Acute bronchitis, unspecified organism   ED Assessment/Plan  Rapid strep  negative.   Presentation most consistent with a viral pharyngitis and  asthmatic bronchitis. Given patient has multiple comorbidities, we will start on azithromycin in addition to prednisone 50 mg for 5 days for the bronchitis. He will adjust his insulin accordingly. We'll also provide a spacer for his albuterol inhaler. He states that he does not need not any prescription for this. He is take 2 puffs every 4-6 hours.  Also ibuprofen 800 mg, Benadryl/Maalox mixture for his sore throat. As for the cough x 1 month  favor GERD versus postnasal drip. No evidence of CHF. Doubt pneumonia. Imaging was deferred today. Follow-up with PMD as needed. To the ER if gets worse.  Discussed labs, MDM, plan and followup with patient. Discussed sn/sx that should prompt return to the ED. Patient  agrees with plan.   Meds ordered this encounter  Medications  . insulin regular (  NOVOLIN R,HUMULIN R) 250 units/2.715mL (100 units/mL) injection    Sig: Inject into the skin 3 (three) times daily before meals.  Marland Kitchen. ibuprofen (ADVIL,MOTRIN) 800 MG tablet    Sig: Take 1 tablet (800 mg total) by mouth every 8 (eight) hours as needed.    Dispense:  30 tablet    Refill:  0  . predniSONE (DELTASONE) 50 MG tablet    Sig: Take 1 tablet (50 mg total) by mouth daily with breakfast.    Dispense:  5 tablet    Refill:  0  . Spacer/Aero-Holding Chambers (AEROCHAMBER PLUS) inhaler    Sig: Use as instructed    Dispense:  1 each    Refill:  2  . azithromycin (ZITHROMAX) 250 MG tablet    Sig: Take 1 tablet (250 mg total) by mouth daily. 2 tabs po on day 1, 1 tab po on days 2-5    Dispense:  6 tablet    Refill:  0    *This clinic note was created using Scientist, clinical (histocompatibility and immunogenetics)Dragon dictation software. Therefore, there may be occasional mistakes despite careful proofreading.  ?   Domenick GongAshley Kollyn Lingafelter, MD 05/01/16 [phone]

## 2016-05-04 LAB — CULTURE, GROUP A STREP (THRC)

## 2016-05-05 ENCOUNTER — Telehealth: Payer: Self-pay | Admitting: *Deleted

## 2016-05-05 NOTE — Telephone Encounter (Signed)
Called patient, no answer, left message on answering machine reporting a negative strep culture. Advised patient to follow up with PCP or [facility] if symptoms persist. 

## 2016-06-14 ENCOUNTER — Encounter: Payer: Self-pay | Admitting: Emergency Medicine

## 2016-06-14 DIAGNOSIS — L03115 Cellulitis of right lower limb: Secondary | ICD-10-CM | POA: Diagnosis not present

## 2016-06-14 DIAGNOSIS — E119 Type 2 diabetes mellitus without complications: Secondary | ICD-10-CM | POA: Insufficient documentation

## 2016-06-14 DIAGNOSIS — Z794 Long term (current) use of insulin: Secondary | ICD-10-CM | POA: Diagnosis not present

## 2016-06-14 DIAGNOSIS — Z79899 Other long term (current) drug therapy: Secondary | ICD-10-CM | POA: Insufficient documentation

## 2016-06-14 DIAGNOSIS — M79604 Pain in right leg: Secondary | ICD-10-CM | POA: Diagnosis present

## 2016-06-14 NOTE — ED Triage Notes (Signed)
Pt ambulatory to triage in nad, reports started having right lower leg pain yesterday, noticed knot to right lower leg, medial aspect.  Area tender to touch.  Bilateral lower extremity edema noted, pt states some swelling normal.

## 2016-06-15 ENCOUNTER — Emergency Department
Admission: EM | Admit: 2016-06-15 | Discharge: 2016-06-15 | Disposition: A | Discharge status: Home / Self Care | Payer: Medicaid Other | Attending: Student in an Organized Health Care Education/Training Program | Admitting: Student in an Organized Health Care Education/Training Program

## 2016-06-15 ENCOUNTER — Emergency Department: Payer: Medicaid Other

## 2016-06-15 DIAGNOSIS — M79604 Pain in right leg: Secondary | ICD-10-CM

## 2016-06-15 DIAGNOSIS — L03115 Cellulitis of right lower limb: Secondary | ICD-10-CM

## 2016-06-15 MED ORDER — CLINDAMYCIN HCL 300 MG PO CAPS: 300.0000 mg | ORAL_CAPSULE | Freq: Three times a day (TID) | ORAL | 0 refills | Status: AC

## 2016-06-15 MED ORDER — TRAMADOL HCL 50 MG PO TABS: 50.0000 mg | ORAL_TABLET | Freq: Four times a day (QID) | ORAL | 0 refills | Status: AC | PRN

## 2016-06-15 MED ORDER — GABAPENTIN 100 MG PO CAPS
200.0000 mg | ORAL_CAPSULE | Freq: Once | ORAL | Status: AC
  Administered 2016-06-15: 200 mg via ORAL
  Filled 2016-06-15: qty 2

## 2016-06-15 MED ORDER — HYDROCODONE-ACETAMINOPHEN 5-325 MG PO TABS
2.0000 | ORAL_TABLET | Freq: Once | ORAL | Status: AC
  Administered 2016-06-15: 2 via ORAL
  Filled 2016-06-15: qty 2

## 2016-06-15 NOTE — ED Notes (Signed)
Pt returned from ultrasound

## 2016-06-15 NOTE — ED Notes (Signed)
Pt to ultrasound

## 2016-06-15 NOTE — ED Provider Notes (Signed)
[facility] Emergency Department Provider Note    First MD Initiated Contact with Patient 06/15/16 [phone]     (approximate)  I have reviewed the triage vital signs and the nursing notes.   HISTORY  Chief Complaint Leg Pain    HPI Jason Davidson is a 36 y.o. male who presents with right lower extremity swelling and calf pain at the mid calf. Patient is morbidly obese. States that he feels that the area has been "feverish" but has not measured any fevers at home. No chills. No nausea or vomiting. Denies any trauma to the area. He is being treated for peripheral neuropathy. Denies any history of DVT. He does not smoke. Feels that the right leg is more swollen than the left.  He denies any chest pain or shortness of breath.   Past Medical History:  Diagnosis Date  . Depressed   . Diabetes mellitus without complication (HCC)    History reviewed. No pertinent family history. History reviewed. No pertinent surgical history. Patient Active Problem List   Diagnosis Date Noted  . Depressed       Prior to Admission medications   Medication Sig Start Date End Date Taking? Authorizing Provider  ALPRAZolam Prudy Feeler) 0.5 MG tablet Take 0.5 mg by mouth at bedtime as needed for anxiety.    Historical Provider, MD  azithromycin (ZITHROMAX) 250 MG tablet Take 1 tablet (250 mg total) by mouth daily. 2 tabs po on day 1, 1 tab po on days 2-5 05/01/16   Domenick Gong, MD  citalopram (CELEXA) 40 MG tablet Take 40 mg by mouth daily.    Historical Provider, MD  dicyclomine (BENTYL) 10 MG capsule Take 10 mg by mouth 3 (three) times daily before meals.    Historical Provider, MD  fluticasone-salmeterol (ADVAIR HFA) 230-21 MCG/ACT inhaler Inhale 2 puffs into the lungs 2 (two) times daily. 05/02/15   Payton Mccallum, MD  Fluticasone-Salmeterol (ADVAIR) 500-50 MCG/DOSE AEPB Inhale 1 puff into the lungs 2 (two) times daily.    Historical Provider, MD  gabapentin (NEURONTIN) 100  MG capsule Take 100 mg by mouth 3 (three) times daily.    Historical Provider, MD  glipiZIDE (GLUCOTROL) 10 MG tablet Take 10 mg by mouth daily before breakfast.    Historical Provider, MD  ibuprofen (ADVIL,MOTRIN) 800 MG tablet Take 1 tablet (800 mg total) by mouth every 8 (eight) hours as needed. 02/10/15   [name], MD  ibuprofen (ADVIL,MOTRIN) 800 MG tablet Take 1 tablet (800 mg total) by mouth every 8 (eight) hours as needed. 05/01/16   Domenick Gong, MD  insulin glargine (LANTUS) 100 UNIT/ML injection Inject 50 Units into the skin at bedtime.     Historical Provider, MD  insulin regular (NOVOLIN R,HUMULIN R) 250 units/2.2mL (100 units/mL) injection Inject into the skin 3 (three) times daily before meals.    Historical Provider, MD  ondansetron (ZOFRAN) 4 MG tablet Take 1 tablet (4 mg total) by mouth every 8 (eight) hours as needed for nausea or vomiting. 03/04/15   Irean Hong, MD  pantoprazole (PROTONIX) 40 MG tablet Take 40 mg by mouth daily.    Historical Provider, MD  predniSONE (DELTASONE) 50 MG tablet Take 1 tablet (50 mg total) by mouth daily with breakfast. 05/01/16   Domenick Gong, MD  ranitidine (ZANTAC) 150 MG capsule Take 150 mg by mouth 2 (two) times daily.    Historical Provider, MD  Spacer/Aero-Holding Chambers (AEROCHAMBER PLUS) inhaler Use as instructed 05/01/16  Domenick Gong, MD  SUMAtriptan (IMITREX) 100 MG tablet Take 100 mg by mouth every 2 (two) hours as needed for migraine. May repeat in 2 hours if headache persists or recurs.    Historical Provider, MD  topiramate (TOPAMAX) 50 MG tablet Take 50 mg by mouth 2 (two) times daily.    Historical Provider, MD    Allergies Erythromycin; Penicillins; and Sulfa antibiotics    Social History Social History  Substance Use Topics  . Smoking status: Never Smoker  . Smokeless tobacco: Never Used  . Alcohol use No    Review of Systems Patient denies headaches, rhinorrhea, blurry vision, numbness, shortness  of breath, chest pain, edema, cough, abdominal pain, nausea, vomiting, diarrhea, dysuria, fevers, rashes or hallucinations unless otherwise stated above in HPI. ____________________________________________   PHYSICAL EXAM:  VITAL SIGNS: Vitals:   06/14/16 2056  BP: 130/72  Pulse: 75  Resp: 20  Temp: 97.5 F (36.4 C)    Constitutional: Alert and oriented. Morbidly obese but in no acute distress. Eyes: Conjunctivae are normal. PERRL. EOMI. Head: Atraumatic. Nose: No congestion/rhinnorhea. Mouth/Throat: Mucous membranes are moist.  Oropharynx non-erythematous. Neck: No stridor. Painless ROM. No cervical spine tenderness to palpation Hematological/Lymphatic/Immunilogical: No cervical lymphadenopathy. Cardiovascular: Normal rate, regular rhythm. Grossly normal heart sounds.  Good peripheral circulation. Respiratory: Normal respiratory effort.  No retractions. Lungs CTAB. Gastrointestinal: Soft and nontender. No distention. No abdominal bruits. No CVA tenderness. Musculoskeletal: Bilateral lower extremity swelling. Small area of tenderness at the mid calf. No evidence of fluctuants or erythema or streaking. Cap refill is brisk distally. No effusions. Painless range of motion of all 4 extremities. Neurologic:  Normal speech and language. No gross focal neurologic deficits are appreciated. No gait instability. Skin:  Skin is warm, dry and intact. No rash noted. Psychiatric: Mood and affect are normal. Speech and behavior are normal.  ____________________________________________   LABS (all labs ordered are listed, but only abnormal results are displayed)  No results found for this or any previous visit (from the past 24 hour(s)). ____________________________________________ ____________________________________________  RADIOLOGY  I personally reviewed all radiographic images ordered to evaluate for the above acute complaints and reviewed radiology reports and findings.  These  findings were personally discussed with the patient.  Please see medical record for radiology report. ____________________________________________   PROCEDURES  Procedure(s) performed:  Procedures    Critical Care performed: no ____________________________________________   INITIAL IMPRESSION / ASSESSMENT AND PLAN / ED COURSE  Pertinent labs & imaging results that were available during my care of the patient were reviewed by me and considered in my medical decision making (see chart for details).  DDX: DVT, edema, cellulitis, abscess  TOMMIE BOHLKEN is a 36 y.o. who presents to the ED with right lower extremity pain and swelling. Presentation concerning for DVT. Less consistent with abscess though exam is limited based on patient's morbid obesity. There is no overlying crepitus or blistering to suggest a deep space or necrotizing soft tissue infection. He has painless passive range of motion. No evidence of trauma. We'll order ultrasound evaluate for any lower extremity DVT.  Clinical Course as of Jun 15 404  Sat Jun 15, 2016  5366 Portable bedside ultrasound shows no evidence of abscess.  Patient sates pain improved.  [PR]  0401 Ultrasound shows no evidence of DVT. Possible collection representing hematoma. Patient states that [place]. Able to ambulate with a steady gait. Based on his pain with area of mild erythema we'll  treat cellulitis with antibiotics. Patient states understanding and has follow-up with PCP.  [PR]    Clinical Course User Index [PR] Willy EddyPatrick Leiam Hopwood, MD     ____________________________________________   FINAL CLINICAL IMPRESSION(S) / ED DIAGNOSES  Final diagnoses:  Right leg pain  Cellulitis of right lower extremity      NEW MEDICATIONS STARTED DURING THIS VISIT:  New Prescriptions   No medications on file     Note:  This document was prepared using Dragon voice recognition software and may include  unintentional dictation errors.    Willy EddyPatrick Myrtle Haller, MD 06/15/16 [phone]

## 2016-06-15 NOTE — ED Notes (Signed)
Pt with what appears to be a swollen varicose vein to medial aspect of right inner lower tibial area. Pt states area is painful to touch. Area is slightly swollen, but no redness or increased warmth appreciated. Pt denies other symptoms.

## 2016-06-19 DIAGNOSIS — L03115 Cellulitis of right lower limb: Secondary | ICD-10-CM | POA: Insufficient documentation

## 2016-07-16 IMAGING — CR RIGHT ANKLE - COMPLETE 3+ VIEW
1 series · 3 of 3 positions shown · non-contrast
Comparison: None.

CLINICAL DATA: Left-sided pain after waking this morning. Left
lateral ankle pain.

EXAM:
RIGHT ANKLE - COMPLETE 3+ VIEW

[Series 1: dxr ankle right complete · 0.14mm/px · 3 of 3 slices shown]
[im 1/3]
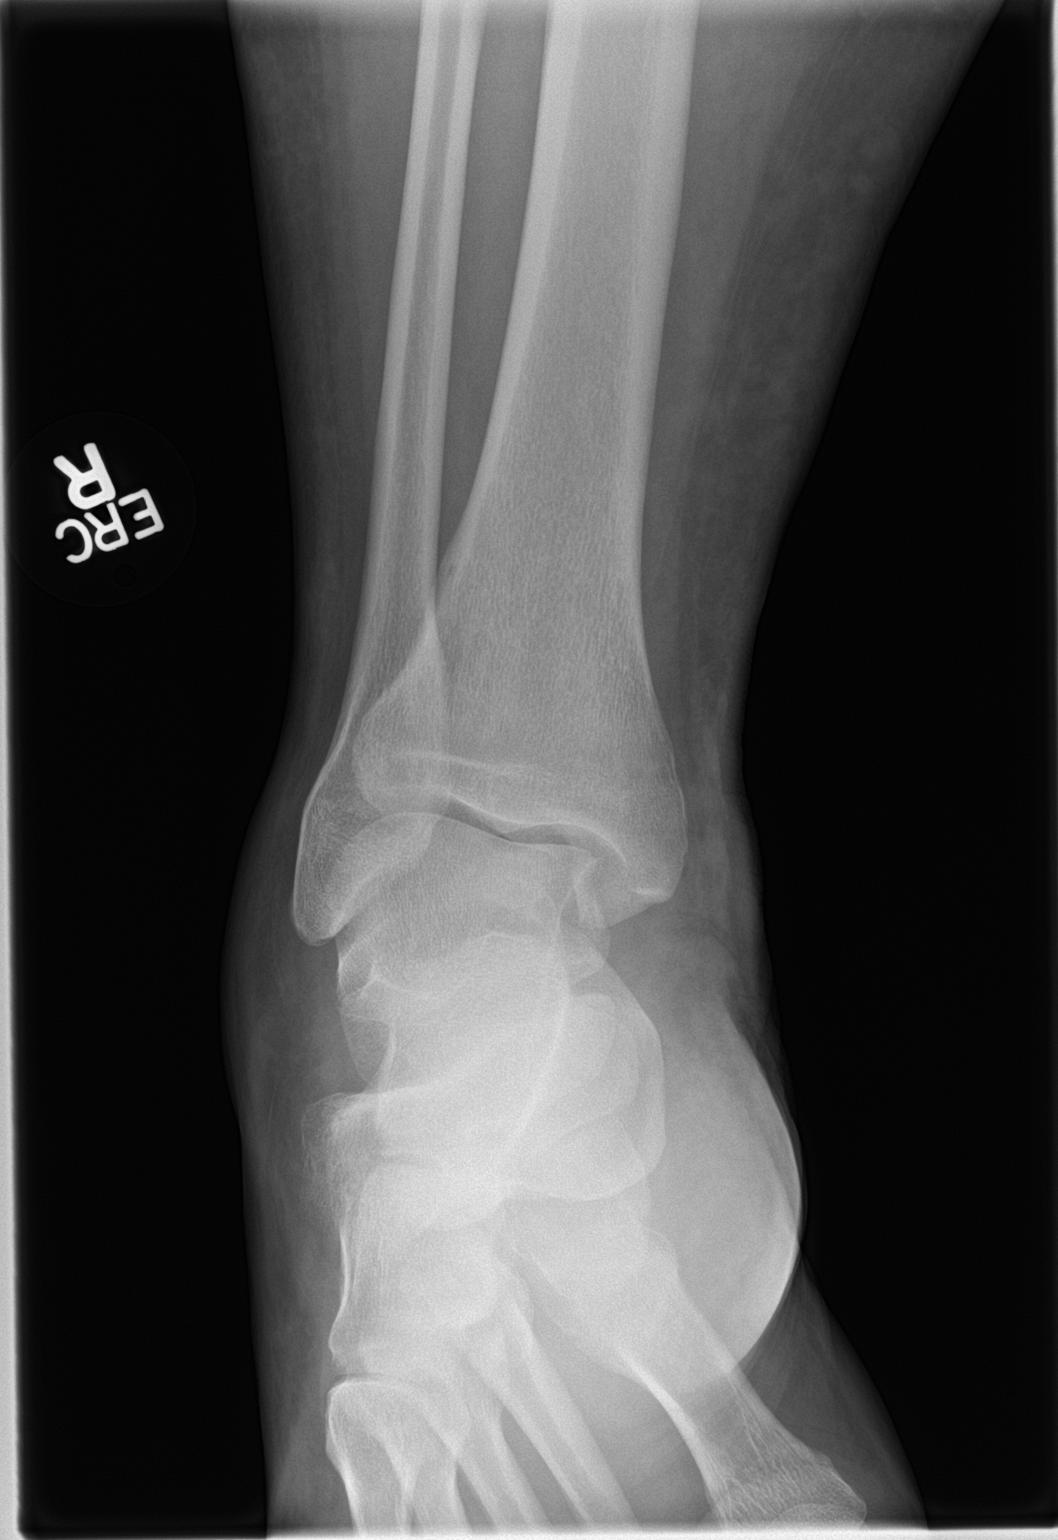
[im 2/3]
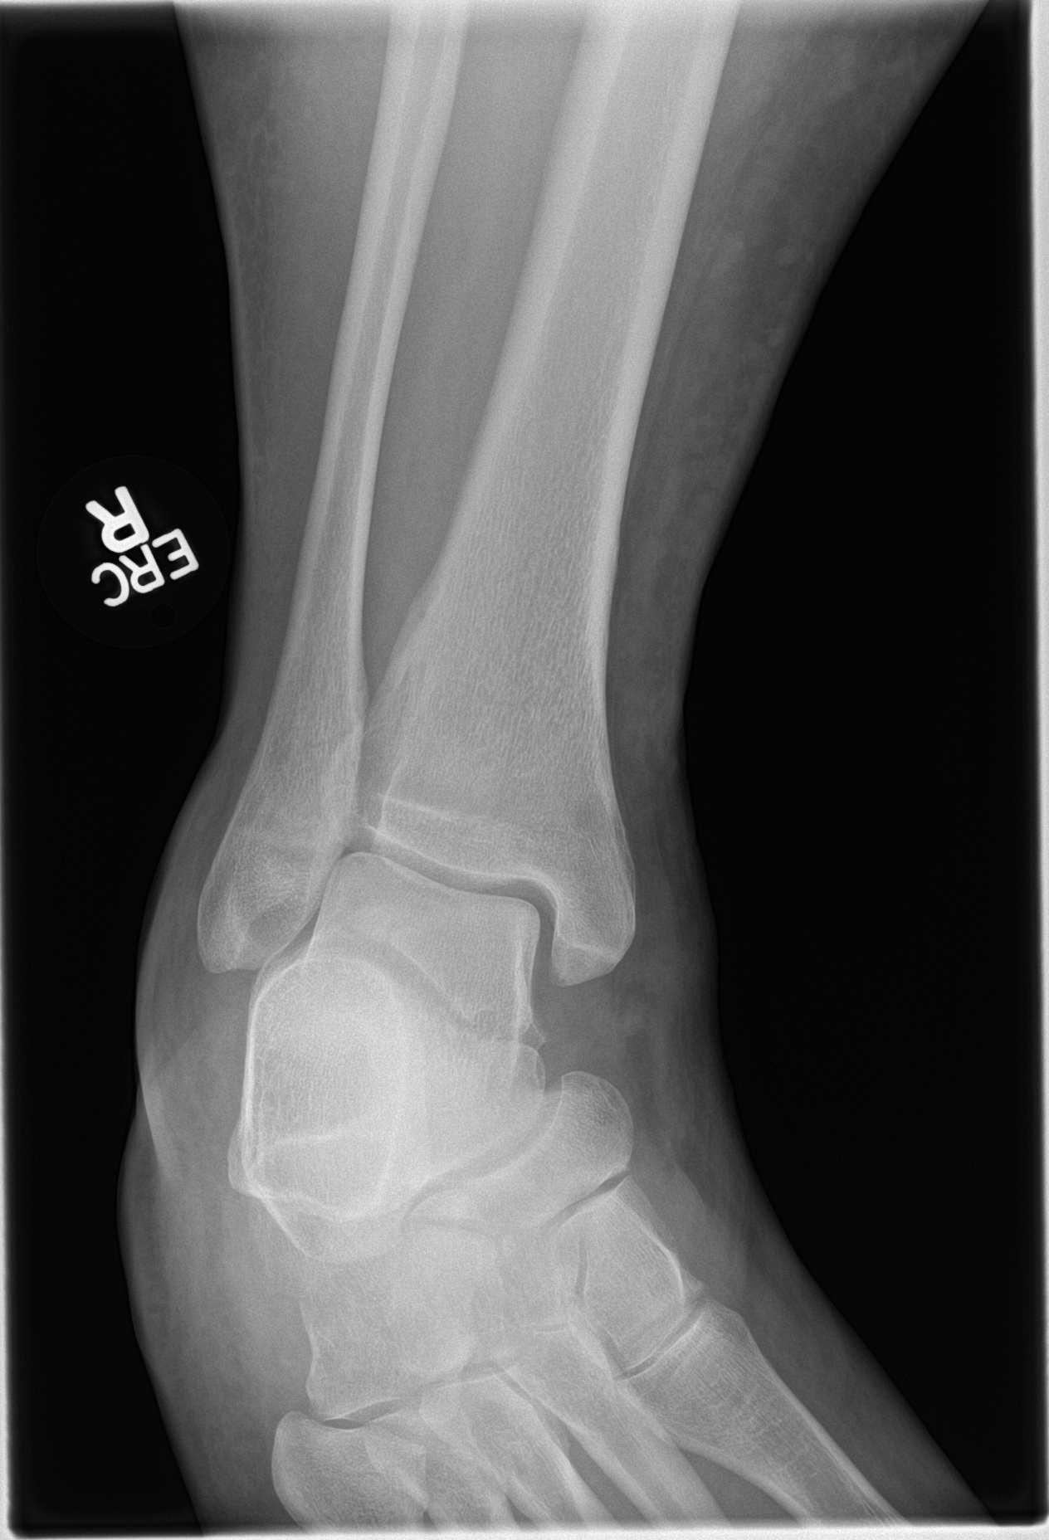
[im 3/3]
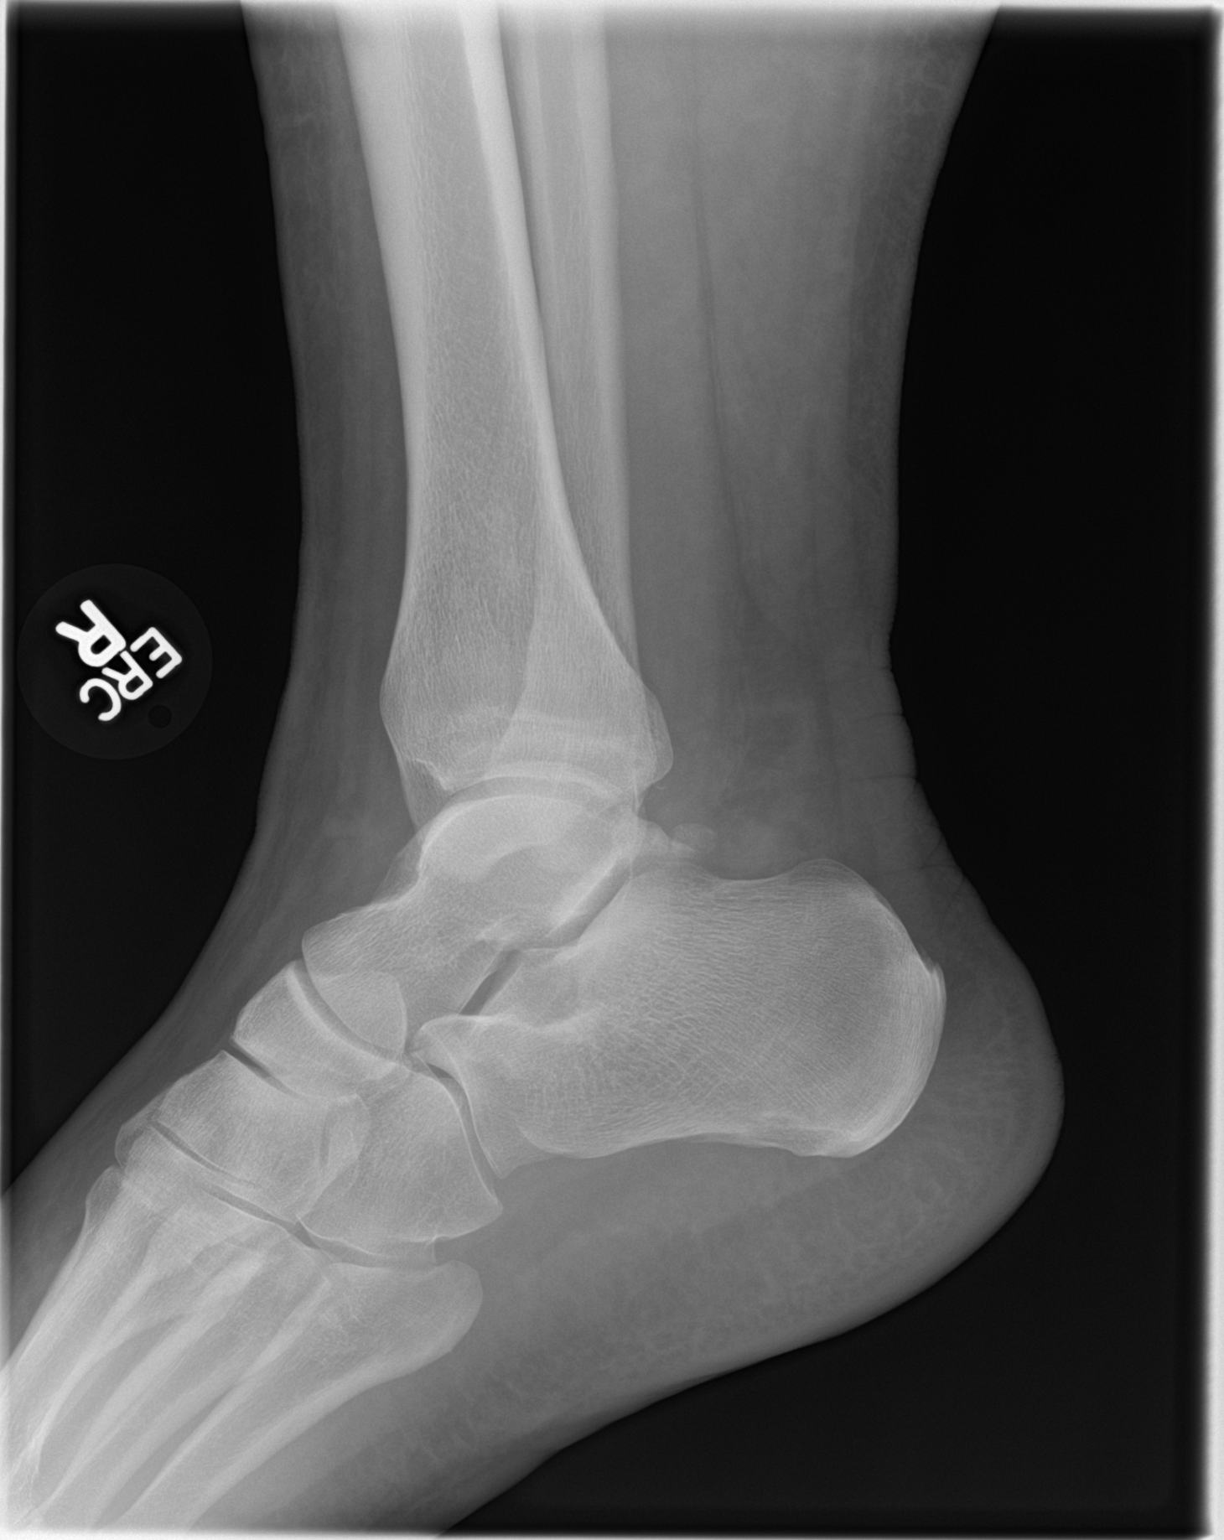

[3 of 3 positions shown; findings below may reference images not displayed]

FINDINGS: Ankle mortise intact. The talar dome is normal. No malleolar
fracture. The calcaneus is normal.
IMPRESSION: No fracture or dislocation.

## 2016-07-26 ENCOUNTER — Encounter: Payer: Self-pay | Admitting: *Deleted

## 2016-07-26 ENCOUNTER — Ambulatory Visit
Admission: EM | Admit: 2016-07-26 | Discharge: 2016-07-26 | Disposition: A | Discharge status: Home / Self Care | Payer: Medicaid Other | Attending: Family Medicine | Admitting: Family Medicine

## 2016-07-26 DIAGNOSIS — R69 Illness, unspecified: Secondary | ICD-10-CM

## 2016-07-26 DIAGNOSIS — J029 Acute pharyngitis, unspecified: Secondary | ICD-10-CM | POA: Diagnosis present

## 2016-07-26 DIAGNOSIS — J111 Influenza due to unidentified influenza virus with other respiratory manifestations: Secondary | ICD-10-CM

## 2016-07-26 DIAGNOSIS — R110 Nausea: Secondary | ICD-10-CM | POA: Insufficient documentation

## 2016-07-26 DIAGNOSIS — Z79899 Other long term (current) drug therapy: Secondary | ICD-10-CM | POA: Insufficient documentation

## 2016-07-26 DIAGNOSIS — R05 Cough: Secondary | ICD-10-CM | POA: Diagnosis not present

## 2016-07-26 LAB — RAPID STREP SCREEN (MED CTR MEBANE ONLY): STREPTOCOCCUS, GROUP A SCREEN (DIRECT): NEGATIVE

## 2016-07-26 MED ORDER — ONDANSETRON 4 MG PO TBDP: 4.0000 mg | ORAL_TABLET | Freq: Once | ORAL | Status: DC

## 2016-07-26 MED ORDER — OSELTAMIVIR PHOSPHATE 75 MG PO CAPS: 75.0000 mg | ORAL_CAPSULE | Freq: Two times a day (BID) | ORAL | 0 refills | Status: DC

## 2016-07-26 NOTE — ED Triage Notes (Signed)
Patient started having symptoms of sore throat and nausea yesterday.

## 2016-07-26 NOTE — ED Provider Notes (Signed)
[facility]    CSN: [id number] Arrival date & time: 07/26/16  1121     History   Chief Complaint Chief Complaint  Patient presents with  . Sore Throat  . Nausea    HPI Jason Davidson is a 36 y.o. male.   The history is provided by the patient.  Sore Throat   URI  Presenting symptoms: congestion, cough, fatigue and sore throat   Severity:  Moderate Onset quality:  Sudden Duration:  1 day Timing:  Constant Progression:  Worsening Chronicity:  New Relieved by:  None tried Associated symptoms: myalgias   Associated symptoms: no sinus pain   Risk factors: sick contacts   Risk factors: not elderly, no chronic cardiac disease, no chronic kidney disease, no chronic respiratory disease, no diabetes mellitus, no immunosuppression, no recent illness and no recent travel     Past Medical History:  Diagnosis Date  . Depressed   . Diabetes mellitus without complication [facility])     Patient Active Problem List   Diagnosis Date Noted  . Depressed     History reviewed. No pertinent surgical history.     Home Medications    Prior to Admission medications   Medication Sig Start Date End Date Taking? Authorizing Provider  ALPRAZolam Prudy Feeler) 0.5 MG tablet Take 0.5 mg by mouth at bedtime as needed for anxiety.   Yes Historical Provider, MD  citalopram (CELEXA) 40 MG tablet Take 40 mg by mouth daily.   Yes Historical Provider, MD  dicyclomine (BENTYL) 10 MG capsule Take 10 mg by mouth 3 (three) times daily before meals.   Yes Historical Provider, MD  fluticasone-salmeterol (ADVAIR HFA) 230-21 MCG/ACT inhaler Inhale 2 puffs into the lungs 2 (two) times daily. 05/02/15  Yes Payton Mccallum, MD  Fluticasone-Salmeterol (ADVAIR) 500-50 MCG/DOSE AEPB Inhale 1 puff into the lungs 2 (two) times daily.   Yes Historical Provider, MD  gabapentin (NEURONTIN) 100 MG capsule Take 100 mg by mouth 3 (three) times daily.   Yes Historical Provider, MD  glipiZIDE (GLUCOTROL) 10 MG  tablet Take 10 mg by mouth daily before breakfast.   Yes Historical Provider, MD  insulin glargine (LANTUS) 100 UNIT/ML injection Inject 50 Units into the skin at bedtime.    Yes Historical Provider, MD  insulin regular (NOVOLIN R,HUMULIN R) 250 units/2.86mL (100 units/mL) injection Inject into the skin 3 (three) times daily before meals.   Yes Historical Provider, MD  ondansetron (ZOFRAN) 4 MG tablet Take 1 tablet (4 mg total) by mouth every 8 (eight) hours as needed for nausea or vomiting. 03/04/15  Yes Irean Hong, MD  pantoprazole (PROTONIX) 40 MG tablet Take 40 mg by mouth daily.   Yes Historical Provider, MD  ranitidine (ZANTAC) 150 MG capsule Take 150 mg by mouth 2 (two) times daily.   Yes Historical Provider, MD  SUMAtriptan (IMITREX) 100 MG tablet Take 100 mg by mouth every 2 (two) hours as needed for migraine. May repeat in 2 hours if headache persists or recurs.   Yes Historical Provider, MD  topiramate (TOPAMAX) 50 MG tablet Take 50 mg by mouth 2 (two) times daily.   Yes Historical Provider, MD  azithromycin (ZITHROMAX) 250 MG tablet Take 1 tablet (250 mg total) by mouth daily. 2 tabs po on day 1, 1 tab po on days 2-5 05/01/16   Domenick Gong, MD  ibuprofen (ADVIL,MOTRIN) 800 MG tablet Take 1 tablet (800 mg total) by mouth every 8 (eight) hours as needed. 02/10/15   Duke Salvia  Dewayne ShorterN Brown, MD  ibuprofen (ADVIL,MOTRIN) 800 MG tablet Take 1 tablet (800 mg total) by mouth every 8 (eight) hours as needed. 05/01/16   Domenick GongAshley Mortenson, MD  oseltamivir (TAMIFLU) 75 MG capsule Take 1 capsule (75 mg total) by mouth 2 (two) times daily. 07/26/16   Payton Mccallumrlando Alizeh Madril, MD  predniSONE (DELTASONE) 50 MG tablet Take 1 tablet (50 mg total) by mouth daily with breakfast. 05/01/16   Domenick GongAshley Mortenson, MD  Spacer/Aero-Holding Chambers (AEROCHAMBER PLUS) inhaler Use as instructed 05/01/16   Domenick GongAshley Mortenson, MD  traMADol (ULTRAM) 50 MG tablet Take 1 tablet (50 mg total) by mouth every 6 (six) hours as needed. 06/15/16 06/15/17   Willy EddyPatrick Robinson, MD    Family History History reviewed. No pertinent family history.  Social History Social History  Substance Use Topics  . Smoking status: Never Smoker  . Smokeless tobacco: Never Used  . Alcohol use No     Allergies   Erythromycin; Penicillins; and Sulfa antibiotics   Review of Systems Review of Systems  Constitutional: Positive for fatigue.  HENT: Positive for congestion and sore throat. Negative for sinus pain.   Respiratory: Positive for cough.   Musculoskeletal: Positive for myalgias.     Physical Exam Triage Vital Signs ED Triage Vitals  Enc Vitals Group     BP 07/26/16 1201 (!) 142/86     Pulse Rate 07/26/16 1201 (!) 112     Resp 07/26/16 1201 20     Temp 07/26/16 1201 98.3 F (36.8 C)     Temp Source 07/26/16 1201 Oral     SpO2 07/26/16 1201 97 %     Weight 07/26/16 1203 (!) 423 lb (191.9 kg)     Height 07/26/16 1203 5\' 11"  (1.803 m)     Head Circumference --      Peak Flow --      Pain Score 07/26/16 1205 7     Pain Loc --      Pain Edu? --      Excl. in GC? --    No data found.   Updated Vital Signs BP (!) 142/86 (BP Location: Left Wrist)   Pulse (!) 112   Temp 98.3 F (36.8 C) (Oral)   Resp 20   Ht 5\' 11"  (1.803 m)   Wt (!) 423 lb (191.9 kg)   SpO2 97%   BMI 59.00 kg/m   Visual Acuity Right Eye Distance:   Left Eye Distance:   Bilateral Distance:    Right Eye Near:   Left Eye Near:    Bilateral Near:     Physical Exam  Constitutional: He appears well-developed and well-nourished. No distress.  HENT:  Head: Normocephalic and atraumatic.  Right Ear: Tympanic membrane, external ear and ear canal normal.  Left Ear: Tympanic membrane, external ear and ear canal normal.  Nose: Nose normal.  Mouth/Throat: Uvula is midline, oropharynx is clear and moist and mucous membranes are normal. No oropharyngeal exudate or tonsillar abscesses.  Eyes: Conjunctivae and EOM are normal. Pupils are equal, round, and reactive to  light. Right eye exhibits no discharge. Left eye exhibits no discharge. No scleral icterus.  Neck: Normal range of motion. Neck supple. No tracheal deviation present. No thyromegaly present.  Cardiovascular: Normal rate, regular rhythm and normal heart sounds.   Pulmonary/Chest: Effort normal and breath sounds normal. No stridor. No respiratory distress. He has no wheezes. He has no rales. He exhibits no tenderness.  Lymphadenopathy:    He has no cervical adenopathy.  Neurological:  He is alert.  Skin: Skin is warm and dry. No rash noted. He is not diaphoretic.  Nursing note and vitals reviewed.    UC Treatments / Results  Labs (all labs ordered are listed, but only abnormal results are displayed) Labs Reviewed  RAPID STREP SCREEN (NOT AT [facility])  CULTURE, GROUP A STREP [facility])    EKG  EKG Interpretation None       Radiology No results found.  Procedures Procedures (including critical care time)  Medications Ordered in UC Medications - No data to display   Initial Impression / Assessment and Plan / UC Course  I have reviewed the triage vital signs and the nursing notes.  Pertinent labs & imaging results that were available during my care of the patient were reviewed by me and considered in my medical decision making (see chart for details).      Final Clinical Impressions(s) / UC Diagnoses   Final diagnoses:  Influenza-like illness    New Prescriptions Discharge Medication List as of 07/26/2016 12:34 PM    START taking these medications   Details  oseltamivir (TAMIFLU) 75 MG capsule Take 1 capsule (75 mg total) by mouth 2 (two) times daily., Starting Fri 07/26/2016, Print       1. Lab results and diagnosis reviewed with patient 2. rx as per orders above; reviewed possible side effects, interactions, risks and benefits  3. Recommend supportive treatment with increased fluids, rest, otc analgesics prn 4. Follow-up prn if symptoms worsen or don't improve     Payton Mccallum, MD 07/26/16 1253

## 2016-07-29 LAB — CULTURE, GROUP A STREP (THRC)

## 2016-08-22 ENCOUNTER — Ambulatory Visit
Admission: EM | Admit: 2016-08-22 | Discharge: 2016-08-22 | Disposition: A | Discharge status: Home / Self Care | Payer: Medicaid Other | Attending: Family Medicine | Admitting: Family Medicine

## 2016-08-22 ENCOUNTER — Encounter: Payer: Self-pay | Admitting: Emergency Medicine

## 2016-08-22 ENCOUNTER — Ambulatory Visit: Payer: Medicaid Other

## 2016-08-22 DIAGNOSIS — Z794 Long term (current) use of insulin: Secondary | ICD-10-CM | POA: Insufficient documentation

## 2016-08-22 DIAGNOSIS — Z880 Allergy status to penicillin: Secondary | ICD-10-CM | POA: Diagnosis not present

## 2016-08-22 DIAGNOSIS — E119 Type 2 diabetes mellitus without complications: Secondary | ICD-10-CM | POA: Diagnosis not present

## 2016-08-22 DIAGNOSIS — M542 Cervicalgia: Secondary | ICD-10-CM | POA: Insufficient documentation

## 2016-08-22 DIAGNOSIS — Z79891 Long term (current) use of opiate analgesic: Secondary | ICD-10-CM | POA: Diagnosis not present

## 2016-08-22 DIAGNOSIS — Z79899 Other long term (current) drug therapy: Secondary | ICD-10-CM | POA: Insufficient documentation

## 2016-08-22 LAB — URINALYSIS, COMPLETE (UACMP) WITH MICROSCOPIC
BACTERIA UA: NONE SEEN
Bilirubin Urine: NEGATIVE
GLUCOSE, UA: NEGATIVE mg/dL
Hgb urine dipstick: NEGATIVE
Ketones, ur: NEGATIVE mg/dL
Leukocytes, UA: NEGATIVE
Nitrite: NEGATIVE
PROTEIN: NEGATIVE mg/dL
RBC / HPF: NONE SEEN RBC/hpf (ref 0–5)
Specific Gravity, Urine: 1.025 (ref 1.005–1.030)
pH: 6 (ref 5.0–8.0)

## 2016-08-22 MED ORDER — KETOROLAC TROMETHAMINE 60 MG/2ML IM SOLN
60.0000 mg | Freq: Once | INTRAMUSCULAR | Status: AC
  Administered 2016-08-22: 60 mg via INTRAMUSCULAR

## 2016-08-22 MED ORDER — PREDNISONE 10 MG PO TABS: ORAL_TABLET | ORAL | 0 refills | Status: DC

## 2016-08-22 NOTE — Discharge Instructions (Signed)
°  Take medication as prescribed. Rest. Drink plenty of fluids. Stretch. Monitor blood sugar closely with medication.  Follow up with your primary care physician this week. Return to Urgent care for new or worsening concerns.

## 2016-08-22 NOTE — ED Triage Notes (Signed)
Neck pain for 2 weeks.Pain got worse today. Giving him a headache.

## 2016-08-22 NOTE — ED Provider Notes (Signed)
[facility] ____________________________________________  Time seen: Approximately 10:15 PM  I have reviewed the triage vital signs and the nursing notes.   HISTORY  Chief Complaint Neck Pain   HPI Jason Davidson is a 36 y.o. male presenting with spouse at bedside for the complaints of left posterior neck pain that is been present times "several weeks ". In further questioning, patient reports neck pain has been present for at least 2-3 weeks. Patient denies specific fall, injury or direct trauma. She reports he does occasionally roll out of bed, but denies any direct injury or acute onset of pain to his back. Patient reports that he has seen his primary care physician for the same complaint and was given oral baclofen. Agent states minimal change of baclofen, no resolution. States occasionally taken over-the-counter ibuprofen for the same complaint. Patient reports he is also recently had influenza as well as strep throat, in which he states his symptoms have fully resolved. Patient reports the neck pain does sometimes cause  tension headaches, consistent with previous headaches.  Patient states neck pain is primarily with movement. When keeping head and neck still pain is mild. States pain does intimately feel like a spasm. Denies any pain radiation, paresthesias, decreased range of motion, skin changes, fevers, decreased strength. Again denies abrupt onset of direct trauma. Patient reports otherwise feels well. Reports uses pills as well as insulin for diabetes control and reports blood sugar this morning was 145.  Denies chest pain, shortness of breath, abdominal pain, vision changes, dizziness, weakness, syncope, near syncope, extremity pain, extremity swelling or rash. Denies recent sickness. Denies recent antibiotic use.   [facility]: PCP   Past Medical History:  Diagnosis Date  . Depressed   . Diabetes mellitus without complication [facility])     Patient  Active Problem List   Diagnosis Date Noted  . Depressed     History reviewed. No pertinent surgical history.   No current facility-administered medications for this encounter.   Current Outpatient Prescriptions:  .  ALPRAZolam (XANAX) 0.5 MG tablet, Take 0.5 mg by mouth at bedtime as needed for anxiety., Disp: , Rfl:  .  citalopram (CELEXA) 40 MG tablet, Take 40 mg by mouth daily., Disp: , Rfl:  .  dicyclomine (BENTYL) 10 MG capsule, Take 10 mg by mouth 3 (three) times daily before meals., Disp: , Rfl:  .  DULoxetine (CYMBALTA) 60 MG capsule, Take 60 mg by mouth daily., Disp: , Rfl:  .  fluticasone-salmeterol (ADVAIR HFA) 230-21 MCG/ACT inhaler, Inhale 2 puffs into the lungs 2 (two) times daily., Disp: 1 Inhaler, Rfl: 0 .  Fluticasone-Salmeterol (ADVAIR) 500-50 MCG/DOSE AEPB, Inhale 1 puff into the lungs 2 (two) times daily., Disp: , Rfl:  .  gabapentin (NEURONTIN) 100 MG capsule, Take 100 mg by mouth 3 (three) times daily., Disp: , Rfl:  .  glipiZIDE (GLUCOTROL) 10 MG tablet, Take 10 mg by mouth daily before breakfast., Disp: , Rfl:  .  ibuprofen (ADVIL,MOTRIN) 800 MG tablet, Take 1 tablet (800 mg total) by mouth every 8 (eight) hours as needed., Disp: 30 tablet, Rfl: 0 .  ibuprofen (ADVIL,MOTRIN) 800 MG tablet, Take 1 tablet (800 mg total) by mouth every 8 (eight) hours as needed., Disp: 30 tablet, Rfl: 0 .  insulin glargine (LANTUS) 100 UNIT/ML injection, Inject 50 Units into the skin at bedtime. , Disp: , Rfl:  .  insulin regular (NOVOLIN R,HUMULIN R) 250 units/2.51mL (100 units/mL) injection, Inject into the skin 3 (three) times daily  before meals., Disp: , Rfl:  .  ondansetron (ZOFRAN) 4 MG tablet, Take 1 tablet (4 mg total) by mouth every 8 (eight) hours as needed for nausea or vomiting., Disp: 10 tablet, Rfl: 0 .  pantoprazole (PROTONIX) 40 MG tablet, Take 40 mg by mouth daily., Disp: , Rfl:  .  ranitidine (ZANTAC) 150 MG capsule, Take 150 mg by mouth 2 (two) times daily., Disp: ,  Rfl:  .  Spacer/Aero-Holding Chambers (AEROCHAMBER PLUS) inhaler, Use as instructed, Disp: 1 each, Rfl: 2 .  SUMAtriptan (IMITREX) 100 MG tablet, Take 100 mg by mouth every 2 (two) hours as needed for migraine. May repeat in 2 hours if headache persists or recurs., Disp: , Rfl:  .  topiramate (TOPAMAX) 50 MG tablet, Take 50 mg by mouth 2 (two) times daily., Disp: , Rfl:  .  azithromycin (ZITHROMAX) 250 MG tablet, Take 1 tablet (250 mg total) by mouth daily. 2 tabs po on day 1, 1 tab po on days 2-5, Disp: 6 tablet, Rfl: 0 .  oseltamivir (TAMIFLU) 75 MG capsule, Take 1 capsule (75 mg total) by mouth 2 (two) times daily., Disp: 10 capsule, Rfl: 0 .  predniSONE (DELTASONE) 10 MG tablet, Start 60 mg po day one, then 50 mg po day two, taper by 10 mg daily until complete., Disp: 21 tablet, Rfl: 0 .  traMADol (ULTRAM) 50 MG tablet, Take 1 tablet (50 mg total) by mouth every 6 (six) hours as needed., Disp: 20 tablet, Rfl: 0  Allergies Erythromycin; Penicillins; and Sulfa antibiotics  No family history on file.  Social History Social History  Substance Use Topics  . Smoking status: Never Smoker  . Smokeless tobacco: Never Used  . Alcohol use No    Review of Systems Constitutional: No fever/chills Eyes: No visual changes. ENT: No sore throat. Cardiovascular: Denies chest pain. Respiratory: Denies shortness of breath. Gastrointestinal: No abdominal pain.  No nausea, no vomiting.  No diarrhea.  No constipation. Musculoskeletal: Negative for back pain.As above. Skin: Negative for rash. Neurological: Negative for focal weakness or numbness.  10-point ROS otherwise negative.  ____________________________________________   PHYSICAL EXAM:  VITAL SIGNS: ED Triage Vitals  Enc Vitals Group     BP 08/22/16 1952 121/61     Pulse Rate 08/22/16 1952 91     Resp 08/22/16 1952 18     Temp 08/22/16 1952 98.8 F (37.1 C)     Temp Source 08/22/16 1952 Tympanic     SpO2 08/22/16 1952 96 %      Weight 08/22/16 1954 (!) 423 lb (191.9 kg)     Height 08/22/16 1954 5\' 11"  (1.803 m)     Head Circumference --      Peak Flow --      Pain Score 08/22/16 1958 8     Pain Loc --      Pain Edu? --      Excl. in GC? --     Constitutional: Alert and oriented. Well appearing and in no acute distress. Eyes: Conjunctivae are normal. PERRL. EOMI. ENT      Head: Normocephalic and atraumatic.      Nose: No congestion/rhinnorhea.      Mouth/Throat: Mucous membranes are moist.Oropharynx non-erythematous. Neck: No stridor. Supple without meningismus.  Cardiovascular: Normal rate, regular rhythm. Grossly normal heart sounds.  Good peripheral circulation. Respiratory: Normal respiratory effort without tachypnea nor retractions. Breath sounds are clear and equal bilaterally. No wheezes, rales, rhonchi. Gastrointestinal: Soft and nontender.  Musculoskeletal:   No  midline thoracic or lumbar tenderness to palpation. Bilateral distal radial pulses equal and easily palpated. bilateral hand grips strong and equal.  Except: Mild midline mid to lower cervical tenderness to palpation, no erythema, no swelling, full range of motion present, palpable left medial trapezius muscle spasm present with right and left cervical rotation, bilateral upper extremities with full range of motion and 5/5 strength and equal sensation. Bilateral hand grips and distal radial pulses strong and equal. Bilateral upper extremities nontender to palpation with full range of motion. Bilateral hands normal distal sensation and capillary refill. Neurologic:  Normal speech and language. No gross focal neurologic deficits are appreciated. Speech is normal. No gait instability.  Skin:  Skin is warm, dry and intact. No rash noted. Psychiatric: Mood and affect are normal. Speech and behavior are normal. Patient exhibits appropriate insight and judgment   ___________________________________________   LABS (all labs ordered are listed, but only  abnormal results are displayed)  Labs Reviewed  URINALYSIS, COMPLETE (UACMP) WITH MICROSCOPIC - Abnormal; Notable for the following:       Result Value   Squamous Epithelial / LPF 6-30 (*)    All other components within normal limits   ____________________________________________  RADIOLOGY  Dg Cervical Spine Complete  Result Date: 08/22/2016 CLINICAL DATA:  36 year old male with neck pain.  No known injury. EXAM: CERVICAL SPINE - COMPLETE 4+ VIEW COMPARISON:  None. FINDINGS: There is no acute fracture or subluxation of the cervical spine. The vertebral body heights and disc spaces are maintained. There is reversal of normal cervical lordosis periaortic at C5-C6. This may be positional or due to muscle spasm. Clinical correlation is recommended. The visualized spinous processes and the odontoid appear intact. There is anatomic alignment of the lateral masses of C1 and C2. The soft tissues appear unremarkable. IMPRESSION: 1. No definite acute fracture or subluxation. 2. Reversal of normal cervical lordosis, likely related to muscle spasm. Electronically Signed   By: Elgie Collard M.D.   On: 08/22/2016 21:08   ____________________________________________   PROCEDURES Procedures    INITIAL IMPRESSION / ASSESSMENT AND PLAN / ED COURSE  Pertinent labs & imaging results that were available during my care of the patient were reviewed by me and considered in my medical decision making (see chart for details).   Well-appearing obese patient presenting for several weeks of back pain. Denies known trauma or injury. Patient expressed concern as he has not yet had imaging of his neck, and would like x-ray of cervical spine completed at this time. Counseled regarding suspect muscular pain as left trapezius tenderness.   Cervical spine completed, per radiologist no definite acute fracture subluxation, reversal of normal cervical lordosis likely related to muscle spasm. Counseled in regard to  patient as pain is continued after taking muscle relaxant and occasional ibuprofen, will place patient on oral prednisone in addition to his baclofen. Counseled regarding monitoring his blood sugar closely and adjusting diet.   Patient then reported that he has has occasional urinary frequency and wants for his urine to be tested, denies any penile or testicular pain or swelling, std concerns, penile discharge, burning with urination, pain with urination but only urinary frequency. Urinalysis reviewed, and no bacteria, negative glucose. Counseled regarding closely monitoring blood sugar as increased blood sugar may be related to urinary frequency. Discussed with patient to follow-up this week with his primary care physician and to follow-up closely including possible further imaging, physical therapy. 60 mg IM Toradol given once in urgent care, after Toradol, patient  reports mild improvement.Discussed indication, risks and benefits of medications with patient.  Discussed follow up with Primary care physician this week. Discussed follow up and return parameters including no resolution or any worsening concerns. Patient verbalized understanding and agreed to plan.   ____________________________________________   FINAL CLINICAL IMPRESSION(S) / ED DIAGNOSES  Final diagnoses:  Neck pain     Discharge Medication List as of 08/22/2016  9:51 PM      Note: This dictation was prepared with Dragon dictation along with smaller phrase technology. Any transcriptional errors that result from this process are unintentional.         Renford Dills, NP 08/22/16 2228

## 2016-09-03 ENCOUNTER — Ambulatory Visit
Admission: EM | Admit: 2016-09-03 | Discharge: 2016-09-03 | Disposition: A | Discharge status: Home / Self Care | Payer: Medicaid Other | Attending: Family Medicine | Admitting: Family Medicine

## 2016-09-03 DIAGNOSIS — F329 Major depressive disorder, single episode, unspecified: Secondary | ICD-10-CM | POA: Insufficient documentation

## 2016-09-03 DIAGNOSIS — J029 Acute pharyngitis, unspecified: Secondary | ICD-10-CM | POA: Insufficient documentation

## 2016-09-03 DIAGNOSIS — Z79899 Other long term (current) drug therapy: Secondary | ICD-10-CM | POA: Diagnosis not present

## 2016-09-03 DIAGNOSIS — Z880 Allergy status to penicillin: Secondary | ICD-10-CM | POA: Insufficient documentation

## 2016-09-03 DIAGNOSIS — Z794 Long term (current) use of insulin: Secondary | ICD-10-CM | POA: Insufficient documentation

## 2016-09-03 DIAGNOSIS — E119 Type 2 diabetes mellitus without complications: Secondary | ICD-10-CM | POA: Diagnosis not present

## 2016-09-03 LAB — RAPID STREP SCREEN (MED CTR MEBANE ONLY): Streptococcus, Group A Screen (Direct): NEGATIVE

## 2016-09-03 MED ORDER — CEFDINIR 300 MG PO CAPS: 300.0000 mg | ORAL_CAPSULE | Freq: Two times a day (BID) | ORAL | 0 refills | Status: DC

## 2016-09-03 NOTE — ED Triage Notes (Signed)
Pt c/o sore throat, left ear pain that started about a week ago.

## 2016-09-03 NOTE — ED Provider Notes (Signed)
CSN: [id number]     Arrival date & time 09/03/16  4098 History   First MD Initiated Contact with Patient 09/03/16 1009     Chief Complaint  Patient presents with  . Sore Throat   (Consider location/radiation/quality/duration/timing/severity/associated sxs/prior Treatment) HPI  This a 36 year old male who presents with a sore throat started a week ago. His throat intensified last night. Is not having cough. He has not run a fever or had any chills. Is it hurts to swallow. Review his medical records he was seen here in February was tested for strep which was negative and thought to have it and influenza type illness placed on Tamiflu and he has improved. However this is his second sore throat in 2 months. His wife also presents with the same symptoms.       Past Medical History:  Diagnosis Date  . Depressed   . Diabetes mellitus without complication (HCC)    History reviewed. No pertinent surgical history. History reviewed. No pertinent family history. Social History  Substance Use Topics  . Smoking status: Never Smoker  . Smokeless tobacco: Never Used  . Alcohol use No    Review of Systems  Constitutional: Positive for activity change. Negative for chills, fatigue and fever.  HENT: Positive for congestion, ear pain, postnasal drip, sinus pain, sinus pressure, sore throat and trouble swallowing.   Respiratory: Negative for cough and shortness of breath.   All other systems reviewed and are negative.   Allergies  Erythromycin; Penicillins; and Sulfa antibiotics  Home Medications   Prior to Admission medications   Medication Sig Start Date End Date Taking? Authorizing Provider  ALPRAZolam Prudy Feeler) 0.5 MG tablet Take 0.5 mg by mouth at bedtime as needed for anxiety.   Yes Historical Provider, MD  citalopram (CELEXA) 40 MG tablet Take 40 mg by mouth daily.   Yes Historical Provider, MD  dicyclomine (BENTYL) 10 MG capsule Take 10 mg by mouth 3 (three) times daily before meals.    Yes Historical Provider, MD  DULoxetine (CYMBALTA) 60 MG capsule Take 60 mg by mouth daily.   Yes Historical Provider, MD  fluticasone-salmeterol (ADVAIR HFA) 230-21 MCG/ACT inhaler Inhale 2 puffs into the lungs 2 (two) times daily. 05/02/15  Yes Payton Mccallum, MD  Fluticasone-Salmeterol (ADVAIR) 500-50 MCG/DOSE AEPB Inhale 1 puff into the lungs 2 (two) times daily.   Yes Historical Provider, MD  gabapentin (NEURONTIN) 100 MG capsule Take 100 mg by mouth 3 (three) times daily.   Yes Historical Provider, MD  glipiZIDE (GLUCOTROL) 10 MG tablet Take 10 mg by mouth daily before breakfast.   Yes Historical Provider, MD  insulin glargine (LANTUS) 100 UNIT/ML injection Inject 50 Units into the skin at bedtime.    Yes Historical Provider, MD  insulin regular (NOVOLIN R,HUMULIN R) 250 units/2.12mL (100 units/mL) injection Inject into the skin 3 (three) times daily before meals.   Yes Historical Provider, MD  pantoprazole (PROTONIX) 40 MG tablet Take 40 mg by mouth daily.   Yes Historical Provider, MD  ranitidine (ZANTAC) 150 MG capsule Take 150 mg by mouth 2 (two) times daily.   Yes Historical Provider, MD  Spacer/Aero-Holding Chambers (AEROCHAMBER PLUS) inhaler Use as instructed 05/01/16  Yes Domenick Gong, MD  SUMAtriptan (IMITREX) 100 MG tablet Take 100 mg by mouth every 2 (two) hours as needed for migraine. May repeat in 2 hours if headache persists or recurs.   Yes Historical Provider, MD  topiramate (TOPAMAX) 50 MG tablet Take 50 mg by mouth 2 (two)  times daily.   Yes Historical Provider, MD  traMADol (ULTRAM) 50 MG tablet Take 1 tablet (50 mg total) by mouth every 6 (six) hours as needed. 06/15/16 06/15/17 Yes Willy EddyPatrick Robinson, MD  cefdinir (OMNICEF) 300 MG capsule Take 1 capsule (300 mg total) by mouth 2 (two) times daily. 09/03/16   Lutricia FeilWilliam P Parke Jandreau, PA-C  ibuprofen (ADVIL,MOTRIN) 800 MG tablet Take 1 tablet (800 mg total) by mouth every 8 (eight) hours as needed. 02/10/15   Darci Currentandolph N Brown, MD   ibuprofen (ADVIL,MOTRIN) 800 MG tablet Take 1 tablet (800 mg total) by mouth every 8 (eight) hours as needed. 05/01/16   Domenick GongAshley Mortenson, MD   Meds Ordered and Administered this Visit  Medications - No data to display  BP 130/82 (BP Location: Left Arm)   Pulse 65   Temp 98.1 F (36.7 C) (Oral)   Resp 20   Ht 5\' 11"  (1.803 m)   Wt (!) 423 lb (191.9 kg)   SpO2 97%   BMI 59.00 kg/m  No data found.   Physical Exam  Constitutional: He is oriented to person, place, and time. He appears well-developed and well-nourished. No distress.  HENT:  Head: Normocephalic and atraumatic.  Right Ear: External ear normal.  Left Ear: External ear normal.  Nose: Nose normal.  Mouth/Throat: Oropharynx is clear and moist. No oropharyngeal exudate.  Eyes: Pupils are equal, round, and reactive to light. Right eye exhibits no discharge. Left eye exhibits no discharge.  Neck: Normal range of motion.  Pulmonary/Chest: Effort normal and breath sounds normal. No respiratory distress. He has no wheezes. He has no rales.  Musculoskeletal: Normal range of motion.  Lymphadenopathy:    He has no cervical adenopathy.  Neurological: He is alert and oriented to person, place, and time.  Skin: Skin is warm and dry. He is not diaphoretic.  Psychiatric: He has a normal mood and affect. His behavior is normal. Judgment and thought content normal.  Nursing note and vitals reviewed.   Urgent Care Course     Procedures (including critical care time)  Labs Review Labs Reviewed  RAPID STREP SCREEN (NOT AT [facility])  CULTURE, GROUP A STREP [facility])    Imaging Review No results found.   Visual Acuity Review  Right Eye Distance:   Left Eye Distance:   Bilateral Distance:    Right Eye Near:   Left Eye Near:    Bilateral Near:         MDM   1. Acute pharyngitis, unspecified etiology    Discharge Medication List as of 09/03/2016 10:30 AM    START taking these medications   Details  cefdinir  (OMNICEF) 300 MG capsule Take 1 capsule (300 mg total) by mouth 2 (two) times daily., Starting Tue 09/03/2016, Normal      Plan: 1. Test/x-ray results and diagnosis reviewed with patient 2. rx as per orders; risks, benefits, potential side effects reviewed with patient 3. Recommend supportive treatment with Rest and fluids. I recommended he use Flonase for at least a month for drainage. Says he has had the second toe sore throat and has many months I will empirically place him on antibiotic. His cultures and sensitivities should be available in 48 hours. If he is not improving he should [facility] 4. F/u prn if symptoms worsen or don't improve     Lutricia FeilWilliam P Hortensia Duffin, PA-C 09/03/16 1039

## 2016-09-05 LAB — CULTURE, GROUP A STREP (THRC)

## 2016-10-01 DIAGNOSIS — IMO0001 Reserved for inherently not codable concepts without codable children: Secondary | ICD-10-CM | POA: Insufficient documentation

## 2016-10-01 DIAGNOSIS — Q141 Congenital malformation of retina: Secondary | ICD-10-CM | POA: Insufficient documentation

## 2016-10-01 DIAGNOSIS — Z0389 Encounter for observation for other suspected diseases and conditions ruled out: Secondary | ICD-10-CM

## 2016-10-01 DIAGNOSIS — Z8669 Personal history of other diseases of the nervous system and sense organs: Secondary | ICD-10-CM | POA: Insufficient documentation

## 2016-11-10 ENCOUNTER — Ambulatory Visit
Admission: EM | Admit: 2016-11-10 | Discharge: 2016-11-10 | Disposition: A | Discharge status: Home / Self Care | Payer: Medicaid Other | Attending: Family Medicine | Admitting: Family Medicine

## 2016-11-10 DIAGNOSIS — R0602 Shortness of breath: Secondary | ICD-10-CM

## 2016-11-10 DIAGNOSIS — R600 Localized edema: Secondary | ICD-10-CM

## 2016-11-10 DIAGNOSIS — E118 Type 2 diabetes mellitus with unspecified complications: Secondary | ICD-10-CM

## 2016-11-10 DIAGNOSIS — L03116 Cellulitis of left lower limb: Secondary | ICD-10-CM

## 2016-11-10 DIAGNOSIS — E119 Type 2 diabetes mellitus without complications: Secondary | ICD-10-CM | POA: Diagnosis not present

## 2016-11-10 DIAGNOSIS — Z794 Long term (current) use of insulin: Secondary | ICD-10-CM

## 2016-11-10 HISTORY — DX: Unspecified asthma, uncomplicated: J45.909

## 2016-11-10 HISTORY — DX: Morbid (severe) obesity due to excess calories: E66.01

## 2016-11-10 NOTE — ED Provider Notes (Signed)
CSN: [id number]     Arrival date & time 11/10/16  1357 History   None    Chief Complaint  Patient presents with  . Foot Pain   (Consider location/radiation/quality/duration/timing/severity/associated sxs/prior Treatment) Patient is a 36 year old male with history of morbid obesity,diabetes mellitus, and asthma who presents with pain and swelling of the left foot that began last nightbut is worse today. Patient reports chronic swelling for which he is on 20 mg of Lasix daily. Patient also  Has a history of diabetes with reported A1c of 7.3 about 2 months ago.He does report seeing a podiatrist regularly. Patient states that he has had some worsening shortness of breath with exertion recentlyand this goes along with some worsening lower extremity edema. Patient denies any chest pain, nausea, and vomiting.  [facility] with lower extremity cellulitis. Patient was tried on oral clindamycin without improvement and required IV daptomycin during his admission.      Past Medical History:  Diagnosis Date  . Asthma   . Depressed   . Diabetes mellitus without complication (HCC)   . Morbid obesity (HCC)    No past surgical history on file. No family history on file. Social History  Substance Use Topics  . Smoking status: Never Smoker  . Smokeless tobacco: Never Used  . Alcohol use No    Review of Systems  Constitutional: Negative for chills and fatigue.  Respiratory: Positive for shortness of breath.   Cardiovascular: Positive for leg swelling.  Endocrine: Positive for polyphagia.  Musculoskeletal: Positive for arthralgias.  Skin:       Redness and pain to left lower extreme.    Allergies  Erythromycin; Penicillins; and Sulfa antibiotics  Home Medications   Prior to Admission medications   Medication Sig Start Date End Date Taking? Authorizing Provider  furosemide (LASIX) 20 MG tablet Take 20 mg by mouth.   Yes [provider]   propranolol (INNOPRAN XL) 120 MG 24 hr capsule Take 120 mg by mouth at bedtime.   Yes [provider]  ALPRAZolam Prudy Feeler(XANAX) 0.5 MG tablet Take 0.5 mg by mouth at bedtime as needed for anxiety.    [provider]  cefdinir (OMNICEF) 300 MG capsule Take 1 capsule (300 mg total) by mouth 2 (two) times daily. 09/03/16   Lutricia Feiloemer, William P, PA-C  citalopram (CELEXA) 40 MG tablet Take 40 mg by mouth daily.    [provider]  dicyclomine (BENTYL) 10 MG capsule Take 10 mg by mouth 3 (three) times daily before meals.    [provider]  DULoxetine (CYMBALTA) 60 MG capsule Take 120 mg by mouth daily.     [provider]  fluticasone-salmeterol (ADVAIR HFA) 230-21 MCG/ACT inhaler Inhale 2 puffs into the lungs 2 (two) times daily. 05/02/15   Payton Mccallumonty, [place], MD  Fluticasone-Salmeterol (ADVAIR) 500-50 MCG/DOSE AEPB Inhale 1 puff into the lungs 2 (two) times daily.    [provider]  gabapentin (NEURONTIN) 100 MG capsule Take 100 mg by mouth 3 (three) times daily.    [provider]  glipiZIDE (GLUCOTROL) 10 MG tablet Take 10 mg by mouth daily before breakfast.    [provider]  ibuprofen (ADVIL,MOTRIN) 800 MG tablet Take 1 tablet (800 mg total) by mouth every 8 (eight) hours as needed. 02/10/15   Darci CurrentBrown, [place] N, MD  ibuprofen (ADVIL,MOTRIN) 800 MG tablet Take 1 tablet (800 mg total) by mouth every 8 (eight) hours as needed. 05/01/16   Mortenson,  Morrie Sheldon, MD  insulin glargine (LANTUS) 100 UNIT/ML injection Inject 50 Units into the skin at bedtime.     [provider]  insulin regular (NOVOLIN R,HUMULIN R) 250 units/2.24mL (100 units/mL) injection Inject into the skin 3 (three) times daily before meals.    [provider]  pantoprazole (PROTONIX) 40 MG tablet Take 40 mg by mouth daily.    [provider]  ranitidine (ZANTAC) 150 MG capsule Take 150 mg by mouth 2 (two) times daily.    [provider]   Spacer/Aero-Holding Chambers (AEROCHAMBER PLUS) inhaler Use as instructed 05/01/16   Domenick Gong, MD  SUMAtriptan (IMITREX) 100 MG tablet Take 100 mg by mouth every 2 (two) hours as needed for migraine. May repeat in 2 hours if headache persists or recurs.    [provider]  topiramate (TOPAMAX) 50 MG tablet Take 50 mg by mouth 2 (two) times daily.    [provider]  traMADol (ULTRAM) 50 MG tablet Take 1 tablet (50 mg total) by mouth every 6 (six) hours as needed. 06/15/16 06/15/17  Willy Eddy, MD   Meds Ordered and Administered this Visit  Medications - No data to display  BP (!) 117/42 (BP Location: Right Arm)   Pulse 86   Temp 98.1 F (36.7 C) (Oral)   Resp (!) 22   Ht 5\' 11"  (1.803 m)   Wt (!) 476 lb (215.9 kg)   SpO2 94%   BMI 66.39 kg/m  No data found.   Physical Exam  Constitutional: He is oriented to person, place, and time. Vital signs are normal. He appears well-developed. He is cooperative.   Morbidly obese  Cardiovascular: Normal heart sounds and normal pulses.  An irregular rhythm present.  Pulmonary/Chest: Effort normal. Tachypnea noted.  Musculoskeletal:       Right foot: There is tenderness and swelling.   Right lower leg and foot with erythema and tenderness to touch. Warmth noted in the foot.  Neurological: He is alert and oriented to person, place, and time. He has normal strength. No cranial nerve deficit.  Skin:   Bilateral lower extremities with chronic vascular changes. Erythema, warmth, and pain to touch of the right or left lower extremity.    Urgent Care Course     Procedures (including critical care time)  Labs Review Labs Reviewed - No data to display  Imaging Review No results found.   MDM   1. Cellulitis of left lower extremity   2. SOB (shortness of breath)   3. Type 2 diabetes mellitus with complication, with long-term current use of insulin (HCC)   4. Lower extremity edema    Patient presents with  left lower extremity swelling, pain anymore for touch. Patient also with symptoms concerning for heart failure with worsening edema in bilateral lower extremities despite Lasix 20 mg daily as well as worsening shortness of breath. Patient did require hospitalization in January for right lower extremity cellulitis that required IV daptomycin. Have recommended to the patient that he go to an ER for further evaluation and treatment. Patient states he is 1 to go the ER at [facility].   Candis Schatz, PA-C     Candis Schatz, PA-C 11/10/16 1534

## 2016-11-10 NOTE — Discharge Instructions (Signed)
-  given history of diabetes as well as recent hospital admission for same last requiring strong IV antibiotics, wil recommend you to go to the ER for further evaluation. -Increasing edema and shortness of breath s concerning for possible need for increase Lasix dosing and would benefit from laboratory as well as radiological evaluation.

## 2016-11-10 NOTE — ED Triage Notes (Addendum)
Pt with bilateral foot pain, left worse than right and swelling. Pain 8/10. Pt called [facility] nurse and was told to go the ER but he said he's rather just come to urgent care. Notices worsening SOB on exertion

## 2016-12-16 MED ORDER — HYDROCORTISONE 2.5 % TOPICAL CREAM WITH PERINEAL APPLICATOR
Freq: Two times a day (BID) | RECTAL | 2 refills | 0 days | Status: CP | PRN
Start: 2016-12-16 — End: ?

## 2016-12-18 ENCOUNTER — Ambulatory Visit: Admission: RE | Admit: 2016-12-18 | Discharge: 2016-12-18 | Payer: MEDICAID | Attending: Family | Admitting: Family

## 2016-12-18 DIAGNOSIS — Z794 Long term (current) use of insulin: Secondary | ICD-10-CM

## 2016-12-18 DIAGNOSIS — E162 Hypoglycemia, unspecified: Secondary | ICD-10-CM

## 2016-12-18 DIAGNOSIS — L918 Other hypertrophic disorders of the skin: Secondary | ICD-10-CM

## 2016-12-18 DIAGNOSIS — E1165 Type 2 diabetes mellitus with hyperglycemia: Principal | ICD-10-CM

## 2016-12-25 MED ORDER — MUPIROCIN 2 % TOPICAL OINTMENT
Freq: Three times a day (TID) | TOPICAL | 1 refills | 0.00000 days | Status: CP
Start: 2016-12-25 — End: 2017-01-01

## 2016-12-27 ENCOUNTER — Ambulatory Visit: Admission: RE | Admit: 2016-12-27 | Discharge: 2016-12-27 | Payer: MEDICAID | Attending: Family | Admitting: Family

## 2016-12-27 DIAGNOSIS — I872 Venous insufficiency (chronic) (peripheral): Secondary | ICD-10-CM

## 2016-12-27 DIAGNOSIS — L918 Other hypertrophic disorders of the skin: Secondary | ICD-10-CM

## 2016-12-27 DIAGNOSIS — L97511 Non-pressure chronic ulcer of other part of right foot limited to breakdown of skin: Principal | ICD-10-CM

## 2016-12-27 MED ORDER — CLINDAMYCIN HCL 300 MG CAPSULE
ORAL_CAPSULE | Freq: Four times a day (QID) | ORAL | 0 refills | 0 days | Status: CP
Start: 2016-12-27 — End: 2017-01-06

## 2016-12-30 ENCOUNTER — Ambulatory Visit (INDEPENDENT_AMBULATORY_CARE_PROVIDER_SITE_OTHER): Payer: Medicaid Other | Admitting: Vascular Surgery

## 2016-12-30 ENCOUNTER — Encounter (INDEPENDENT_AMBULATORY_CARE_PROVIDER_SITE_OTHER): Payer: Self-pay | Admitting: Vascular Surgery

## 2016-12-30 DIAGNOSIS — E1142 Type 2 diabetes mellitus with diabetic polyneuropathy: Secondary | ICD-10-CM | POA: Diagnosis not present

## 2016-12-30 DIAGNOSIS — K219 Gastro-esophageal reflux disease without esophagitis: Secondary | ICD-10-CM | POA: Diagnosis not present

## 2016-12-30 DIAGNOSIS — I872 Venous insufficiency (chronic) (peripheral): Secondary | ICD-10-CM

## 2016-12-30 DIAGNOSIS — Z794 Long term (current) use of insulin: Secondary | ICD-10-CM

## 2016-12-30 DIAGNOSIS — M79604 Pain in right leg: Secondary | ICD-10-CM

## 2016-12-30 DIAGNOSIS — I890 Lymphedema, not elsewhere classified: Secondary | ICD-10-CM

## 2016-12-30 DIAGNOSIS — M79605 Pain in left leg: Secondary | ICD-10-CM | POA: Diagnosis not present

## 2016-12-30 MED ORDER — RANITIDINE 150 MG TABLET
ORAL_TABLET | 0 refills | 0 days | Status: CP
Start: 2016-12-30 — End: 2017-01-14

## 2016-12-30 MED ORDER — DICYCLOMINE 10 MG CAPSULE
ORAL_CAPSULE | 0 refills | 0 days | Status: CP
Start: 2016-12-30 — End: 2017-01-14

## 2016-12-31 ENCOUNTER — Encounter (INDEPENDENT_AMBULATORY_CARE_PROVIDER_SITE_OTHER): Payer: Self-pay | Admitting: Vascular Surgery

## 2016-12-31 DIAGNOSIS — E119 Type 2 diabetes mellitus without complications: Secondary | ICD-10-CM | POA: Insufficient documentation

## 2016-12-31 DIAGNOSIS — I890 Lymphedema, not elsewhere classified: Secondary | ICD-10-CM | POA: Insufficient documentation

## 2016-12-31 DIAGNOSIS — I872 Venous insufficiency (chronic) (peripheral): Secondary | ICD-10-CM | POA: Insufficient documentation

## 2016-12-31 DIAGNOSIS — K219 Gastro-esophageal reflux disease without esophagitis: Secondary | ICD-10-CM | POA: Insufficient documentation

## 2016-12-31 DIAGNOSIS — M79606 Pain in leg, unspecified: Secondary | ICD-10-CM | POA: Insufficient documentation

## 2016-12-31 DIAGNOSIS — M25579 Pain in unspecified ankle and joints of unspecified foot: Secondary | ICD-10-CM | POA: Insufficient documentation

## 2016-12-31 NOTE — Progress Notes (Signed)
MRN : [id number]  Jason Davidson is a 36 y.o. (1980-08-17) male who presents with chief complaint of  Chief Complaint  Patient presents with  . New Evaluation    cellulitis  .  History of Present Illness:Patient is seen for evaluation of leg pain and leg swelling. The patient first noticed the swelling remotely. The swelling is associated with pain and discoloration. The pain and swelling worsens with prolonged dependency and improves with elevation. The pain is unrelated to activity.  The patient notes that in the morning the legs are significantly improved but they steadily worsened throughout the course of the day. The patient also notes a steady worsening of the discoloration in the ankle and shin area.   The patient denies claudication symptoms.  The patient denies symptoms consistent with rest pain.  The patient denies and extensive history of DJD and LS spine disease.  The patient has no had any past angiography, interventions or vascular surgery.  Elevation makes the leg symptoms better, dependency makes them much worse. There is no history of ulcerations. The patient denies any recent changes in medications.  The patient has not been wearing graduated compression.  The patient denies a history of DVT or PE. There is no prior history of phlebitis. There is no history of primary lymphedema.  No history of malignancies. No history of trauma or groin or pelvic surgery. There is no history of radiation treatment to the groin or pelvis  The patient denies amaurosis fugax or recent TIA symptoms. There are no recent neurological changes noted. The patient denies recent episodes of angina or shortness of breath  Current Meds  Medication Sig  . ALPRAZolam (XANAX) 0.5 MG tablet Take 0.5 mg by mouth at bedtime as needed for anxiety.  . cefdinir (OMNICEF) 300 MG capsule Take 1 capsule (300 mg total) by mouth 2 (two) times daily.  . citalopram (CELEXA) 40 MG tablet Take 40 mg by  mouth daily.  Marland Kitchen dicyclomine (BENTYL) 10 MG capsule Take 10 mg by mouth 3 (three) times daily before meals.  . DULoxetine (CYMBALTA) 60 MG capsule Take 120 mg by mouth daily.   . fluticasone-salmeterol (ADVAIR HFA) 230-21 MCG/ACT inhaler Inhale 2 puffs into the lungs 2 (two) times daily.  . Fluticasone-Salmeterol (ADVAIR) 500-50 MCG/DOSE AEPB Inhale 1 puff into the lungs 2 (two) times daily.  . furosemide (LASIX) 20 MG tablet Take 20 mg by mouth.  . gabapentin (NEURONTIN) 100 MG capsule Take 100 mg by mouth 3 (three) times daily.  Marland Kitchen glipiZIDE (GLUCOTROL) 10 MG tablet Take 10 mg by mouth daily before breakfast.  . ibuprofen (ADVIL,MOTRIN) 800 MG tablet Take 1 tablet (800 mg total) by mouth every 8 (eight) hours as needed.  Marland Kitchen ibuprofen (ADVIL,MOTRIN) 800 MG tablet Take 1 tablet (800 mg total) by mouth every 8 (eight) hours as needed.  . insulin glargine (LANTUS) 100 UNIT/ML injection Inject 50 Units into the skin at bedtime.   . insulin regular (NOVOLIN R,HUMULIN R) 250 units/2.93mL (100 units/mL) injection Inject into the skin 3 (three) times daily before meals.  . pantoprazole (PROTONIX) 40 MG tablet Take 40 mg by mouth daily.  . propranolol (INNOPRAN XL) 120 MG 24 hr capsule Take 120 mg by mouth at bedtime.  . ranitidine (ZANTAC) 150 MG capsule Take 150 mg by mouth 2 (two) times daily.  Marland Kitchen Spacer/Aero-Holding Chambers (AEROCHAMBER PLUS) inhaler Use as instructed  . SUMAtriptan (IMITREX) 100 MG tablet Take 100 mg by mouth every 2 (two) hours  as needed for migraine. May repeat in 2 hours if headache persists or recurs.  . topiramate (TOPAMAX) 50 MG tablet Take 50 mg by mouth 2 (two) times daily.    Past Medical History:  Diagnosis Date  . Asthma   . Depressed   . Diabetes mellitus without complication (HCC)   . Morbid obesity (HCC)     Past Surgical History:  Procedure Laterality Date  . EYE SURGERY      Social History Social History  Substance Use Topics  . Smoking status: Never  Smoker  . Smokeless tobacco: Never Used  . Alcohol use No    Family History Family History  Problem Relation Age of Onset  . Diabetes Mother   . Diabetes Sister   . Diabetes Brother   . Diabetes Maternal Uncle   . Diabetes Maternal Grandmother   . Heart disease Maternal Grandmother   . Cancer Maternal Grandfather   . COPD Paternal Grandmother   . Cancer Paternal Grandfather   . Varicose Veins Paternal Grandfather   No family history of bleeding/clotting disorders, porphyria or autoimmune disease   Allergies  Allergen Reactions  . Erythromycin Nausea And Vomiting  . Penicillins Swelling  . Sulfa Antibiotics Nausea And Vomiting     REVIEW OF SYSTEMS (Negative unless checked)  Constitutional: [] Weight loss  [] Fever  [] Chills Cardiac: [] Chest pain   [] Chest pressure   [] Palpitations   [] Shortness of breath when laying flat   [] Shortness of breath with exertion. Vascular:  [x] Pain in legs with walking   [x] Pain in legs with standing  [] History of DVT   [] Phlebitis   [x] Swelling in legs   [x] Varicose veins   [] Non-healing ulcers Pulmonary:   [] Uses home oxygen   [] Productive cough   [] Hemoptysis   [] Wheeze  [] COPD   [x] Asthma Neurologic:  [] Dizziness   [] Seizures   [] History of stroke   [] History of TIA  [] Aphasia   [] Vissual changes   [] Weakness or numbness in arm   [] Weakness or numbness in leg Musculoskeletal:   [] Joint swelling   [x] Joint pain   [] Low back pain Hematologic:  [] Easy bruising  [] Easy bleeding   [] Hypercoagulable state   [] Anemic Gastrointestinal:  [] Diarrhea   [] Vomiting  [] Gastroesophageal reflux/heartburn   [] Difficulty swallowing. Genitourinary:  [] Chronic kidney disease   [] Difficult urination  [] Frequent urination   [] Blood in urine Skin:  [x] Rashes   [] Ulcers  Psychological:  [] History of anxiety   []  History of major depression.  Physical Examination  Vitals:   12/30/16 1309  BP: (!) 141/77  Pulse: 71  Resp: 16  Weight: (!) 217.2 kg (478 lb 12.8  oz)  Height: 5\' 11"  (1.803 m)   Body mass index is 66.78 kg/m. Gen: WD/WN, NAD Head: [place]/AT, No temporalis wasting.  Ear/Nose/Throat: Hearing grossly intact, nares w/o erythema or drainage, poor dentition Eyes: PER, EOMI, sclera nonicteric.  Neck: Supple, no masses.  No bruit or JVD.  Pulmonary:  Good air movement, clear to auscultation bilaterally, no use of accessory muscles.  Cardiac: RRR, normal S1, S2, no Murmurs. Vascular: 2-3+ edema bilaterally with severe venous changes bilaterally.  No ulcers noted in the ankle area bilaterally Vessel Right Left  Radial Palpable Palpable  Ulnar Palpable Palpable  Brachial Palpable Palpable  Carotid Palpable Palpable  Femoral Palpable Palpable  Popliteal Palpable Palpable  PT Palpable Palpable  DP Trace Palpable Trace Palpable   Gastrointestinal: soft, non-distended. No guarding/no peritoneal signs.  Musculoskeletal: M/S 5/5 throughout.  No deformity or atrophy.  Neurologic:  CN 2-12 intact. Pain and light touch intact in extremities.  Symmetrical.  Speech is fluent. Motor exam as listed above. Psychiatric: Judgment intact, Mood & affect appropriate for pt's clinical situation. Dermatologic: + venous rashes but no ulcers noted.  No changes consistent with cellulitis. Lymph : No Cervical lymphadenopathy, no lichenification or skin changes of chronic lymphedema.  CBC Lab Results  Component Value Date   WBC 8.0 02/14/2014   HGB 14.8 02/14/2014   HCT 43.4 02/14/2014   MCV 94 02/14/2014   PLT 175 02/14/2014    BMET    Component Value Date/Time   NA 137 02/14/2014 0437   K 3.8 02/14/2014 0437   CL 101 02/14/2014 0437   CO2 26 02/14/2014 0437   GLUCOSE 307 (H) 02/14/2014 0437   BUN 12 02/14/2014 0437   CREATININE 1.01 02/14/2014 0437   CALCIUM 8.4 (L) 02/14/2014 0437   GFRNONAA >60 02/14/2014 0437   GFRAA >60 02/14/2014 0437   CrCl cannot be calculated (Patient's most recent lab result is older than the maximum 21 days  allowed.).  COAG No results found for: INR, PROTIME  Radiology No results found.  Outside Studies/Documentation 19 pages of outside documents were reviewed.  They showed A long history of venous insufficiency patient has actually had ulcerations in the past which appear to have healed at this point. No clear documentation of appropriate right with decompression use.  Assessment/Plan 1. Chronic venous insufficiency No surgery or intervention at this point in time.    I have had a long discussion with the patient regarding venous insufficiency and why it  causes symptoms. I have discussed with the patient the chronic skin changes that accompany venous insufficiency and the long term sequela such as infection and ulceration.  Patient will begin wearing graduated compression stockings class 1 (20-30 mmHg) or compression wraps on a daily basis a prescription was given. The patient will put the stockings on first thing in the morning and removing them in the evening. The patient is instructed specifically not to sleep in the stockings.    In addition, behavioral modification including several periods of elevation of the lower extremities during the day will be continued. I have demonstrated that proper elevation is a position with the ankles at heart level.  The patient is instructed to begin routine exercise, especially walking on a daily basis  Patient should undergo duplex ultrasound of the venous system to ensure that DVT or reflux is not present.  Following the review of the ultrasound the patient will follow up in 2-3 months to reassess the degree of swelling and the control that graduated compression stockings or compression wraps  is offering.   The patient can be assessed for a Lymph Pump at that time, but just given the appearance of his legs at initial presentation I am completely convinced that the patient will need a lymph pump to obtain adequate control.  Also of note: Given the  patient's size as well as his restricted mobility secondary to his knees I believe that a lift chair is medically indicated. This would allow for the patient to properly elevate his legs. I do not believe a traditional recliner will be sufficient given his body habitus. I have asked him to follow up with his primary care regarding further investigation for a lift chair.   A total of 70 minutes was spent with this patient and greater than 50% was spent in counseling and coordination of care with the patient.  Discussion included  the treatment options for vascular disease including indications for surgery and intervention.  Also discussed is the appropriate timing of treatment.  In addition medical therapy was discussed.  - VAS [place] LOWER EXTREMITY VENOUS REFLUX; Future  2. Lymphedema See #1  3. 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 Type 2 diabetes mellitus with diabetic polyneuropathy, with long-term current use of insulin (HCC) Continue hypoglycemic medications as already ordered, these medications have been reviewed and there are no changes at this time.  Hgb A1C to be monitored as already arranged by primary service   5. Gastroesophageal reflux disease without esophagitis Continue H2 blocker as already ordered, these medications have been reviewed and there are no changes at this time.     Levora Dredge, MD  12/31/2016 8:41 AM

## 2017-01-03 MED ORDER — OXYCODONE 5 MG TABLET
ORAL_TABLET | Freq: Four times a day (QID) | ORAL | 0 refills | 0 days | Status: CP | PRN
Start: 2017-01-03 — End: 2017-05-20

## 2017-01-14 ENCOUNTER — Ambulatory Visit
Admission: RE | Admit: 2017-01-14 | Discharge: 2017-01-14 | Disposition: A | Discharge status: Home / Self Care | Payer: MEDICAID | Attending: Internal Medicine | Admitting: Internal Medicine

## 2017-01-14 DIAGNOSIS — IMO0001 Class 3 obesity with serious comorbidity and body mass index (BMI) of 60.0 to 69.9 in adult, unspecified obesity type: Secondary | ICD-10-CM

## 2017-01-14 DIAGNOSIS — R12 Heartburn: Secondary | ICD-10-CM

## 2017-01-14 DIAGNOSIS — K649 Unspecified hemorrhoids: Secondary | ICD-10-CM

## 2017-01-14 DIAGNOSIS — K591 Functional diarrhea: Principal | ICD-10-CM

## 2017-01-14 MED ORDER — DIPHENOXYLATE-ATROPINE 2.5 MG-0.025 MG TABLET
ORAL_TABLET | Freq: Every day | ORAL | 1 refills | 0 days | Status: CP | PRN
Start: 2017-01-14 — End: 2017-06-28

## 2017-01-14 MED ORDER — DICYCLOMINE 10 MG CAPSULE
ORAL_CAPSULE | Freq: Three times a day (TID) | ORAL | 11 refills | 0 days | Status: CP
Start: 2017-01-14 — End: 2017-12-02

## 2017-01-14 MED ORDER — HYDROCORTISONE ACETATE 25 MG RECTAL SUPPOSITORY
Freq: Two times a day (BID) | RECTAL | 5 refills | 0.00000 days | Status: CP
Start: 2017-01-14 — End: 2018-02-27

## 2017-01-14 MED ORDER — RANITIDINE 150 MG TABLET
ORAL_TABLET | Freq: Two times a day (BID) | ORAL | 3 refills | 0 days | Status: CP
Start: 2017-01-14 — End: 2018-01-05

## 2017-01-14 MED ORDER — PANTOPRAZOLE 40 MG TABLET,DELAYED RELEASE
ORAL_TABLET | Freq: Every day | ORAL | 3 refills | 0 days | Status: CP
Start: 2017-01-14 — End: 2017-06-15

## 2017-01-29 MED ORDER — INSULIN U-100 REGULAR HUMAN 100 UNIT/ML INJECTION SOLUTION
2 refills | 0 days | Status: CP
Start: 2017-01-29 — End: 2017-05-26

## 2017-01-30 ENCOUNTER — Encounter (INDEPENDENT_AMBULATORY_CARE_PROVIDER_SITE_OTHER): Payer: Self-pay | Admitting: Vascular Surgery

## 2017-01-30 ENCOUNTER — Ambulatory Visit (INDEPENDENT_AMBULATORY_CARE_PROVIDER_SITE_OTHER): Payer: Medicaid Other

## 2017-01-30 ENCOUNTER — Ambulatory Visit (INDEPENDENT_AMBULATORY_CARE_PROVIDER_SITE_OTHER): Payer: Medicaid Other | Admitting: Vascular Surgery

## 2017-01-30 VITALS — BP 135/79 | HR 73 | Resp 18 | Wt >= 6400 oz

## 2017-01-30 DIAGNOSIS — M79605 Pain in left leg: Secondary | ICD-10-CM

## 2017-01-30 DIAGNOSIS — I890 Lymphedema, not elsewhere classified: Secondary | ICD-10-CM | POA: Diagnosis not present

## 2017-01-30 DIAGNOSIS — I872 Venous insufficiency (chronic) (peripheral): Secondary | ICD-10-CM

## 2017-01-30 DIAGNOSIS — M79604 Pain in right leg: Secondary | ICD-10-CM

## 2017-01-30 DIAGNOSIS — K219 Gastro-esophageal reflux disease without esophagitis: Secondary | ICD-10-CM

## 2017-01-31 NOTE — Progress Notes (Signed)
MRN : [id number]  Jason Davidson is a 36 y.o. (18-Aug-1980) male who presents with chief complaint of  Chief Complaint  Patient presents with  . Follow-up  .  History of Present Illness: The patient returns to the office for followup evaluation regarding leg swelling.  The swelling has persisted and the pain associated with swelling continues. There have not been any interval development of a ulcerations or wounds.  Since the previous visit the patient has been wearing graduated compression stockings and has noted little if any improvement in the lymphedema. The patient has been using compression routinely morning until night.  The patient also states elevation during the day and exercise is being done too.   Current Meds  Medication Sig  . ALPRAZolam (XANAX) 0.5 MG tablet Take 0.5 mg by mouth at bedtime as needed for anxiety.  . cefdinir (OMNICEF) 300 MG capsule Take 1 capsule (300 mg total) by mouth 2 (two) times daily.  . citalopram (CELEXA) 40 MG tablet Take 40 mg by mouth daily.  Marland Kitchen dicyclomine (BENTYL) 10 MG capsule Take 10 mg by mouth 3 (three) times daily before meals.  . DULoxetine (CYMBALTA) 60 MG capsule Take 120 mg by mouth daily.   . fluticasone-salmeterol (ADVAIR HFA) 230-21 MCG/ACT inhaler Inhale 2 puffs into the lungs 2 (two) times daily.  . Fluticasone-Salmeterol (ADVAIR) 500-50 MCG/DOSE AEPB Inhale 1 puff into the lungs 2 (two) times daily.  . furosemide (LASIX) 20 MG tablet Take 20 mg by mouth.  . gabapentin (NEURONTIN) 100 MG capsule Take 100 mg by mouth 3 (three) times daily.  Marland Kitchen glipiZIDE (GLUCOTROL) 10 MG tablet Take 10 mg by mouth daily before breakfast.  . ibuprofen (ADVIL,MOTRIN) 800 MG tablet Take 1 tablet (800 mg total) by mouth every 8 (eight) hours as needed.  Marland Kitchen ibuprofen (ADVIL,MOTRIN) 800 MG tablet Take 1 tablet (800 mg total) by mouth every 8 (eight) hours as needed.  . insulin glargine (LANTUS) 100 UNIT/ML injection Inject 50 Units into the skin at  bedtime.   . insulin regular (NOVOLIN R,HUMULIN R) 250 units/2.42mL (100 units/mL) injection Inject into the skin 3 (three) times daily before meals.  . pantoprazole (PROTONIX) 40 MG tablet Take 40 mg by mouth daily.  . propranolol (INNOPRAN XL) 120 MG 24 hr capsule Take 120 mg by mouth at bedtime.  . ranitidine (ZANTAC) 150 MG capsule Take 150 mg by mouth 2 (two) times daily.  Marland Kitchen Spacer/Aero-Holding Chambers (AEROCHAMBER PLUS) inhaler Use as instructed  . SUMAtriptan (IMITREX) 100 MG tablet Take 100 mg by mouth every 2 (two) hours as needed for migraine. May repeat in 2 hours if headache persists or recurs.  . topiramate (TOPAMAX) 50 MG tablet Take 50 mg by mouth 2 (two) times daily.  . traMADol (ULTRAM) 50 MG tablet Take 1 tablet (50 mg total) by mouth every 6 (six) hours as needed.    Past Medical History:  Diagnosis Date  . Asthma   . Depressed   . Diabetes mellitus without complication (HCC)   . Morbid obesity (HCC)     Past Surgical History:  Procedure Laterality Date  . EYE SURGERY      Social History Social History  Substance Use Topics  . Smoking status: Never Smoker  . Smokeless tobacco: Never Used  . Alcohol use No    Family History Family History  Problem Relation Age of Onset  . Diabetes Mother   . Diabetes Sister   . Diabetes Brother   . Diabetes  Maternal Uncle   . Diabetes Maternal Grandmother   . Heart disease Maternal Grandmother   . Cancer Maternal Grandfather   . COPD Paternal Grandmother   . Cancer Paternal Grandfather   . Varicose Veins Paternal Grandfather     Allergies  Allergen Reactions  . Erythromycin Nausea And Vomiting  . Penicillins Swelling  . Sulfa Antibiotics Nausea And Vomiting     REVIEW OF SYSTEMS (Negative unless checked)  Constitutional: [] Weight loss  [] Fever  [] Chills Cardiac: [] Chest pain   [] Chest pressure   [] Palpitations   [] Shortness of breath when laying flat   [] Shortness of breath with exertion. Vascular:  [] Pain  in legs with walking   [x] Pain in legs with standing  [] History of DVT   [] Phlebitis   [x] Swelling in legs   [] Varicose veins   [] Non-healing ulcers Pulmonary:   [] Uses home oxygen   [] Productive cough   [] Hemoptysis   [] Wheeze  [] COPD   [] Asthma Neurologic:  [] Dizziness   [] Seizures   [] History of stroke   [] History of TIA  [] Aphasia   [] Vissual changes   [] Weakness or numbness in arm   [] Weakness or numbness in leg Musculoskeletal:   [] Joint swelling   [x] Joint pain   [] Low back pain Hematologic:  [] Easy bruising  [] Easy bleeding   [] Hypercoagulable state   [] Anemic Gastrointestinal:  [] Diarrhea   [] Vomiting  [] Gastroesophageal reflux/heartburn   [] Difficulty swallowing. Genitourinary:  [] Chronic kidney disease   [] Difficult urination  [] Frequent urination   [] Blood in urine Skin:  [] Rashes   [] Ulcers  Psychological:  [] History of anxiety   []  History of major depression.  Physical Examination  Vitals:   01/30/17 1633  BP: 135/79  Pulse: 73  Resp: 18  Weight: (!) 220 kg (485 lb)   Body mass index is 67.64 kg/m. Gen: WD/WN, NAD morbidly obese Head: [place]/AT, No temporalis wasting.  Ear/Nose/Throat: Hearing grossly intact, nares w/o erythema or drainage Eyes: PER, EOMI, sclera nonicteric.  Neck: Supple, no large masses.   Pulmonary:  Good air movement, no audible wheezing bilaterally, no use of accessory muscles.  Cardiac: RRR, no JVD Vascular:  moderate venous stasis changes to the legs bilaterally.  4+ soft pitting edema Vessel Right Left  Radial Palpable Palpable  PT Palpable Palpable  DP Palpable Palpable  Gastrointestinal: Non-distended. No guarding/no peritoneal signs.  Musculoskeletal: M/S 5/5 throughout.  No deformity or atrophy.  Neurologic: CN 2-12 intact. Symmetrical.  Speech is fluent. Motor exam as listed above. Psychiatric: Judgment intact, Mood & affect appropriate for pt's clinical situation. Dermatologic: No rashes or ulcers noted.  No changes consistent with  cellulitis. Lymph : No lichenification or skin changes of chronic lymphedema.  CBC Lab Results  Component Value Date   WBC 8.0 02/14/2014   HGB 14.8 02/14/2014   HCT 43.4 02/14/2014   MCV 94 02/14/2014   PLT 175 02/14/2014    BMET    Component Value Date/Time   NA 137 02/14/2014 0437   K 3.8 02/14/2014 0437   CL 101 02/14/2014 0437   CO2 26 02/14/2014 0437   GLUCOSE 307 (H) 02/14/2014 0437   BUN 12 02/14/2014 0437   CREATININE 1.01 02/14/2014 0437   CALCIUM 8.4 (L) 02/14/2014 0437   GFRNONAA >60 02/14/2014 0437   GFRAA >60 02/14/2014 0437   CrCl cannot be calculated (Patient's most recent lab result is older than the maximum 21 days allowed.).  COAG No results found for: INR, PROTIME  Radiology No results found.   Assessment/Plan 1. Lymphedema  Recommend:  No surgery or intervention at this point in time.    I have reviewed my previous discussion with the patient regarding swelling and why it causes symptoms.  Patient will continue wearing graduated compression stockings class 1 (20-30 mmHg) on a daily basis. The patient will  beginning wearing the stockings first thing in the morning and removing them in the evening. The patient is instructed specifically not to sleep in the stockings.    In addition, behavioral modification including several periods of elevation of the lower extremities during the day will be continued.  This was reviewed with the patient during the initial visit.  The patient will also continue routine exercise, especially walking on a daily basis as was discussed during the initial visit.    Despite conservative treatments including graduated compression therapy class 1 and behavioral modification including exercise and elevation the patient  has not obtained adequate control of the lymphedema.  The patient still has stage 3 lymphedema and therefore, I believe that a lymph pump should be added to improve the control of the patient's lymphedema.   Additionally, a lymph pump is warranted because it will reduce the risk of cellulitis and ulceration in the future.  Patient should follow-up in six months    2. Chronic venous insufficiency Recommend:  No surgery or intervention at this point in time.    I have reviewed my previous discussion with the patient regarding swelling and why it causes symptoms.  Patient will continue wearing graduated compression stockings class 1 (20-30 mmHg) on a daily basis. The patient will  beginning wearing the stockings first thing in the morning and removing them in the evening. The patient is instructed specifically not to sleep in the stockings.    In addition, behavioral modification including several periods of elevation of the lower extremities during the day will be continued.  This was reviewed with the patient during the initial visit.  The patient will also continue routine exercise, especially walking on a daily basis as was discussed during the initial visit.    Despite conservative treatments including graduated compression therapy class 1 and behavioral modification including exercise and elevation the patient  has not obtained adequate control of the lymphedema.  The patient still has stage 3 lymphedema and therefore, I believe that a lymph pump should be added to improve the control of the patient's lymphedema.  Additionally, a lymph pump is warranted because it will reduce the risk of cellulitis and ulceration in the future.  Patient should follow-up in six months    3. Pain in both lower extremities See #1&2  4. Gastroesophageal reflux disease without esophagitis Continue PPI as already ordered, these medications have been reviewed and there are no changes at this time.     Levora Dredge, MD  01/31/2017 4:11 PM

## 2017-02-11 ENCOUNTER — Ambulatory Visit
Admission: RE | Admit: 2017-02-11 | Discharge: 2017-02-11 | Disposition: A | Discharge status: Home / Self Care | Attending: Physician Assistant

## 2017-02-11 DIAGNOSIS — E611 Iron deficiency: Secondary | ICD-10-CM

## 2017-02-11 DIAGNOSIS — G478 Other sleep disorders: Secondary | ICD-10-CM

## 2017-02-11 DIAGNOSIS — G4733 Obstructive sleep apnea (adult) (pediatric): Secondary | ICD-10-CM

## 2017-02-11 DIAGNOSIS — G2581 Restless legs syndrome: Secondary | ICD-10-CM

## 2017-02-11 DIAGNOSIS — R259 Unspecified abnormal involuntary movements: Principal | ICD-10-CM

## 2017-02-11 DIAGNOSIS — N3944 Nocturnal enuresis: Secondary | ICD-10-CM

## 2017-02-11 DIAGNOSIS — F5101 Primary insomnia: Secondary | ICD-10-CM

## 2017-02-26 ENCOUNTER — Ambulatory Visit
Admission: RE | Admit: 2017-02-26 | Discharge: 2017-02-27 | Disposition: A | Discharge status: Home / Self Care | Payer: MEDICAID

## 2017-02-26 DIAGNOSIS — G2581 Restless legs syndrome: Secondary | ICD-10-CM

## 2017-02-26 DIAGNOSIS — G4733 Obstructive sleep apnea (adult) (pediatric): Secondary | ICD-10-CM

## 2017-02-26 DIAGNOSIS — N3944 Nocturnal enuresis: Secondary | ICD-10-CM

## 2017-02-26 DIAGNOSIS — R259 Unspecified abnormal involuntary movements: Principal | ICD-10-CM

## 2017-03-05 ENCOUNTER — Encounter (INDEPENDENT_AMBULATORY_CARE_PROVIDER_SITE_OTHER): Payer: Self-pay | Admitting: Vascular Surgery

## 2017-03-07 ENCOUNTER — Encounter (INDEPENDENT_AMBULATORY_CARE_PROVIDER_SITE_OTHER): Payer: Self-pay | Admitting: Vascular Surgery

## 2017-03-10 MED ORDER — ALBUTEROL SULFATE HFA 90 MCG/ACTUATION AEROSOL INHALER
RESPIRATORY_TRACT | 5 refills | 0 days | Status: CP | PRN
Start: 2017-03-10 — End: 2017-08-18

## 2017-03-10 MED ORDER — DICLOFENAC SODIUM 50 MG TABLET,DELAYED RELEASE
ORAL_TABLET | Freq: Two times a day (BID) | ORAL | 1 refills | 0.00000 days | Status: CP | PRN
Start: 2017-03-10 — End: 2018-03-10

## 2017-03-10 MED ORDER — FLUTICASONE 500 MCG-SALMETEROL 50 MCG/DOSE BLISTR POWDR FOR INHALATION
Freq: Two times a day (BID) | RESPIRATORY_TRACT | 5 refills | 0 days | Status: CP
Start: 2017-03-10 — End: 2018-04-20

## 2017-03-10 MED ORDER — CETIRIZINE 10 MG TABLET
ORAL_TABLET | Freq: Every day | ORAL | 5 refills | 0.00000 days | Status: CP
Start: 2017-03-10 — End: 2018-02-08

## 2017-03-10 MED ORDER — FLUTICASONE PROPIONATE 50 MCG/ACTUATION NASAL SPRAY,SUSPENSION
5 refills | 0 days | Status: CP
Start: 2017-03-10 — End: 2017-05-20

## 2017-03-14 MED ORDER — DICLOFENAC SODIUM 50 MG TABLET,DELAYED RELEASE
ORAL_TABLET | 5 refills | 0 days | Status: CP
Start: 2017-03-14 — End: 2017-06-28

## 2017-03-17 ENCOUNTER — Encounter (INDEPENDENT_AMBULATORY_CARE_PROVIDER_SITE_OTHER): Payer: Self-pay | Admitting: Vascular Surgery

## 2017-03-19 ENCOUNTER — Ambulatory Visit: Admission: RE | Admit: 2017-03-19 | Discharge: 2017-03-19 | Payer: MEDICAID | Attending: Family | Admitting: Family

## 2017-03-19 DIAGNOSIS — I872 Venous insufficiency (chronic) (peripheral): Secondary | ICD-10-CM

## 2017-03-19 DIAGNOSIS — Z6841 Body Mass Index (BMI) 40.0 and over, adult: Secondary | ICD-10-CM

## 2017-03-19 DIAGNOSIS — E1165 Type 2 diabetes mellitus with hyperglycemia: Principal | ICD-10-CM

## 2017-03-19 DIAGNOSIS — Z794 Long term (current) use of insulin: Secondary | ICD-10-CM

## 2017-03-31 MED ORDER — MONTELUKAST 10 MG TABLET
ORAL_TABLET | Freq: Every day | ORAL | 2 refills | 0.00000 days | Status: CP
Start: 2017-03-31 — End: 2017-11-21

## 2017-04-20 ENCOUNTER — Emergency Department
Admission: EM | Admit: 2017-04-20 | Discharge: 2017-04-20 | Disposition: A | Discharge status: Home / Self Care | Payer: MEDICAID | Source: Intra-hospital

## 2017-04-20 DIAGNOSIS — R319 Hematuria, unspecified: Principal | ICD-10-CM

## 2017-04-23 MED ORDER — PROPRANOLOL ER 120 MG CAPSULE,24 HR,EXTENDED RELEASE
ORAL_CAPSULE | Freq: Every day | ORAL | 1 refills | 0 days | Status: CP
Start: 2017-04-23 — End: 2017-08-07

## 2017-04-29 ENCOUNTER — Ambulatory Visit
Admission: RE | Admit: 2017-04-29 | Discharge: 2017-04-29 | Disposition: A | Discharge status: Home / Self Care | Payer: MEDICAID

## 2017-04-29 DIAGNOSIS — R319 Hematuria, unspecified: Principal | ICD-10-CM

## 2017-05-04 ENCOUNTER — Ambulatory Visit
Admission: EM | Admit: 2017-05-04 | Discharge: 2017-05-04 | Disposition: A | Discharge status: Home / Self Care | Payer: Medicaid Other | Attending: Emergency Medicine | Admitting: Emergency Medicine

## 2017-05-04 ENCOUNTER — Encounter: Payer: Self-pay | Admitting: Emergency Medicine

## 2017-05-04 DIAGNOSIS — J209 Acute bronchitis, unspecified: Secondary | ICD-10-CM

## 2017-05-04 MED ORDER — HYDROCOD POLST-CPM POLST ER 10-8 MG/5ML PO SUER: 5.0000 mL | Freq: Every evening | ORAL | 0 refills | Status: DC | PRN

## 2017-05-04 MED ORDER — PREDNISONE 20 MG PO TABS: 40.0000 mg | ORAL_TABLET | Freq: Every day | ORAL | 0 refills | Status: DC

## 2017-05-04 NOTE — ED Triage Notes (Signed)
Patient states he has a cough and congestion which started 1 week ago

## 2017-05-04 NOTE — Discharge Instructions (Signed)
Please take medications as prescribed.  Continue with albuterol inhalers as needed.  Return to the clinic for any fevers, worsening cough, shortness of breath or any other changes in your health.

## 2017-05-04 NOTE — ED Provider Notes (Signed)
[facility]    CSN: [id number] Arrival date & time: 05/04/17  1219     History   Chief Complaint Chief Complaint  Patient presents with  . Cough    HPI Jason Davidson is a 36 y.o. male.   Presents to the urgent care facility for evaluation for cough, congestion, sore throat.  Symptoms have been present for 7 days.  He complains of a dry nonproductive cough.  He has been using albuterol inhalers at home with relief.  He denies any fevers, chest pain or shortness of breath.  No abdominal pain, nausea, vomiting or diarrhea. HPI  Past Medical History:  Diagnosis Date  . Asthma   . Depressed   . Diabetes mellitus without complication (HCC)   . Morbid obesity [facility])     Patient Active Problem List   Diagnosis Date Noted  . Chronic venous insufficiency 12/31/2016  . Lymphedema 12/31/2016  . Leg pain 12/31/2016  . Diabetes (HCC) 12/31/2016  . GERD (gastroesophageal reflux disease) 12/31/2016  . Depressed     Past Surgical History:  Procedure Laterality Date  . EYE SURGERY         Home Medications    Prior to Admission medications   Medication Sig Start Date End Date Taking? Authorizing Provider  ALPRAZolam Prudy Feeler) 0.5 MG tablet Take 0.5 mg by mouth at bedtime as needed for anxiety.   Yes [provider]  dicyclomine (BENTYL) 10 MG capsule Take 10 mg by mouth 3 (three) times daily before meals.   Yes [provider]  DULoxetine (CYMBALTA) 60 MG capsule Take 120 mg by mouth daily.    Yes [provider]  fluticasone-salmeterol (ADVAIR HFA) 230-21 MCG/ACT inhaler Inhale 2 puffs into the lungs 2 (two) times daily. 05/02/15  Yes Payton Mccallum, MD  gabapentin (NEURONTIN) 100 MG capsule Take 100 mg by mouth 3 (three) times daily.   Yes [provider]  glipiZIDE (GLUCOTROL) 10 MG tablet Take 10 mg by mouth daily before breakfast.   Yes [provider]  ibuprofen (ADVIL,MOTRIN) 800 MG tablet Take 1 tablet (800 mg  total) by mouth every 8 (eight) hours as needed. 02/10/15  Yes [name], MD  insulin glargine (LANTUS) 100 UNIT/ML injection Inject 50 Units into the skin at bedtime.    Yes [provider]  insulin regular (NOVOLIN R,HUMULIN R) 250 units/2.7mL (100 units/mL) injection Inject into the skin 3 (three) times daily before meals.   Yes [provider]  SUMAtriptan (IMITREX) 100 MG tablet Take 100 mg by mouth every 2 (two) hours as needed for migraine. May repeat in 2 hours if headache persists or recurs.   Yes [provider]  topiramate (TOPAMAX) 50 MG tablet Take 50 mg by mouth 2 (two) times daily.   Yes [provider]  cefdinir (OMNICEF) 300 MG capsule Take 1 capsule (300 mg total) by mouth 2 (two) times daily. 09/03/16   Lutricia Feil, PA-C  chlorpheniramine-HYDROcodone (TUSSIONEX PENNKINETIC ER) 10-8 MG/5ML SUER Take 5 mLs by mouth at bedtime as needed for cough. 05/04/17   Evon Slack, PA-C  citalopram (CELEXA) 40 MG tablet Take 40 mg by mouth daily.    [provider]  Fluticasone-Salmeterol (ADVAIR) 500-50 MCG/DOSE AEPB Inhale 1 puff into the lungs 2 (two) times daily.    [provider]  furosemide (LASIX) 20 MG tablet Take 20 mg by mouth.    [provider]  ibuprofen (ADVIL,MOTRIN) 800 MG tablet Take 1 tablet (  800 mg total) by mouth every 8 (eight) hours as needed. 05/01/16   Domenick GongMortenson, Ashley, MD  pantoprazole (PROTONIX) 40 MG tablet Take 40 mg by mouth daily.    [provider]  predniSONE (DELTASONE) 20 MG tablet Take 2 tablets (40 mg total) by mouth daily. 05/04/17   Evon SlackGaines, Jonpaul Lumm C, PA-C  propranolol (INNOPRAN XL) 120 MG 24 hr capsule Take 120 mg by mouth at bedtime.    [provider]  ranitidine (ZANTAC) 150 MG capsule Take 150 mg by mouth 2 (two) times daily.    [provider]  Spacer/Aero-Holding Chambers (AEROCHAMBER PLUS) inhaler Use as instructed 05/01/16   Domenick GongMortenson, Ashley,  MD  traMADol (ULTRAM) 50 MG tablet Take 1 tablet (50 mg total) by mouth every 6 (six) hours as needed. 06/15/16 06/15/17  Willy Eddyobinson, Patrick, MD    Family History Family History  Problem Relation Age of Onset  . Diabetes Mother   . Diabetes Sister   . Diabetes Brother   . Diabetes Maternal Uncle   . Diabetes Maternal Grandmother   . Heart disease Maternal Grandmother   . Cancer Maternal Grandfather   . COPD Paternal Grandmother   . Cancer Paternal Grandfather   . Varicose Veins Paternal Grandfather     Social History Social History   Tobacco Use  . Smoking status: Never Smoker  . Smokeless tobacco: Never Used  Substance Use Topics  . Alcohol use: No  . Drug use: No     Allergies   Erythromycin; Penicillins; and Sulfa antibiotics   Review of Systems Review of Systems  Constitutional: Negative.  Negative for activity change, appetite change, chills and fever.  HENT: Positive for congestion and sore throat. Negative for ear pain, mouth sores, rhinorrhea, sinus pressure and trouble swallowing.   Eyes: Negative for photophobia, pain and discharge.  Respiratory: Positive for cough. Negative for chest tightness and shortness of breath.   Cardiovascular: Negative for chest pain and leg swelling.  Gastrointestinal: Negative for abdominal distention, abdominal pain, diarrhea, nausea and vomiting.  Genitourinary: Negative for difficulty urinating and dysuria.  Musculoskeletal: Negative for arthralgias, back pain and gait problem.  Skin: Negative for color change and rash.  Neurological: Negative for dizziness and headaches.  Hematological: Negative for adenopathy.  Psychiatric/Behavioral: Negative for agitation and behavioral problems.     Physical Exam Triage Vital Signs ED Triage Vitals  Enc Vitals Group     BP 05/04/17 1314 (!) 125/56     Pulse Rate 05/04/17 1314 65     Resp 05/04/17 1314 18     Temp 05/04/17 1314 98 F (36.7 C)     Temp Source 05/04/17 1314 Oral      SpO2 05/04/17 1314 97 %     Weight --      Height 05/04/17 1310 5\' 11"  (1.803 m)     Head Circumference --      Peak Flow --      Pain Score 05/04/17 1310 7     Pain Loc --      Pain Edu? --      Excl. in GC? --    No data found.  Updated Vital Signs BP (!) 125/56   Pulse 65   Temp 98 F (36.7 C) (Oral)   Resp 18   Ht 5\' 11"  (1.803 m)   SpO2 97%   BMI 67.64 kg/m   Visual Acuity Right Eye Distance:   Left Eye Distance:   Bilateral Distance:    Right Eye Near:  Left Eye Near:    Bilateral Near:     Physical Exam  Constitutional: He is oriented to person, place, and time. He appears well-developed and well-nourished.  HENT:  Head: Normocephalic and atraumatic.  Right Ear: External ear normal.  Left Ear: External ear normal.  Nose: Nose normal.  Mouth/Throat: Oropharynx is clear and moist. No oropharyngeal exudate.  No maxillary or frontal sinus tenderness.  No pharyngeal exudates or swelling.  Uvula is midline.  Eyes: Conjunctivae are normal.  Neck: Normal range of motion.  Cardiovascular: Normal rate.  Pulmonary/Chest: Effort normal. No stridor. No respiratory distress. He has no wheezes. He has no rales. He exhibits no tenderness.  Musculoskeletal: Normal range of motion.  Lymphadenopathy:    He has cervical adenopathy.  Neurological: He is alert and oriented to person, place, and time.  Skin: Skin is warm. No rash noted.  Psychiatric: He has a normal mood and affect. His behavior is normal. Thought content normal.     UC Treatments / Results  Labs (all labs ordered are listed, but only abnormal results are displayed) Labs Reviewed - No data to display  EKG  EKG Interpretation None       Radiology No results found.  Procedures Procedures (including critical care time)  Medications Ordered in UC Medications - No data to display   Initial Impression / Assessment and Plan / UC Course  I have reviewed the triage vital signs and the nursing  notes.  Pertinent labs & imaging results that were available during my care of the patient were reviewed by me and considered in my medical decision making (see chart for details).     36 year old male with continued cough times 7 days.  He is afebrile cough is dry nonproductive.  He will continue with albuterol and he is given a prednisone prescription for 5 days as well as a nighttime cough medication.  He is educated on signs and symptoms return to the ED for. Final Clinical Impressions(s) / UC Diagnoses   Final diagnoses:  Acute bronchitis, unspecified organism    ED Discharge Orders        Ordered    predniSONE (DELTASONE) 20 MG tablet  Daily     05/04/17 1334    chlorpheniramine-HYDROcodone (TUSSIONEX PENNKINETIC ER) 10-8 MG/5ML SUER  At bedtime PRN     05/04/17 1334       Controlled Substance Prescriptions [place] Controlled Substance Registry consulted? Yes, I have consulted the [place] Controlled Substances Registry for this patient, and feel the risk/benefit ratio today is favorable for proceeding with this prescription for a controlled substance.   Evon SlackGaines, Kejon Feild C, [place]PA-C 05/04/17 1339

## 2017-05-20 ENCOUNTER — Ambulatory Visit
Admission: RE | Admit: 2017-05-20 | Discharge: 2017-05-20 | Disposition: A | Discharge status: Home / Self Care | Payer: MEDICAID | Attending: Physician Assistant | Admitting: Physician Assistant

## 2017-05-20 DIAGNOSIS — Z9989 Dependence on other enabling machines and devices: Secondary | ICD-10-CM

## 2017-05-20 DIAGNOSIS — G4761 Periodic limb movement disorder: Secondary | ICD-10-CM

## 2017-05-20 DIAGNOSIS — Z6841 Body Mass Index (BMI) 40.0 and over, adult: Secondary | ICD-10-CM

## 2017-05-20 DIAGNOSIS — G4733 Obstructive sleep apnea (adult) (pediatric): Principal | ICD-10-CM

## 2017-05-20 MED ORDER — FLUTICASONE PROPIONATE 50 MCG/ACTUATION NASAL SPRAY,SUSPENSION
5 refills | 0 days | Status: CP
Start: 2017-05-20 — End: 2019-01-06

## 2017-05-23 ENCOUNTER — Ambulatory Visit: Admission: RE | Admit: 2017-05-23 | Discharge: 2017-05-23 | Payer: MEDICAID | Attending: Family | Admitting: Family

## 2017-05-23 DIAGNOSIS — L03115 Cellulitis of right lower limb: Principal | ICD-10-CM

## 2017-05-23 MED ORDER — CLINDAMYCIN HCL 300 MG CAPSULE
ORAL_CAPSULE | Freq: Four times a day (QID) | ORAL | 0 refills | 0 days | Status: CP
Start: 2017-05-23 — End: 2017-06-02

## 2017-05-26 ENCOUNTER — Ambulatory Visit: Payer: Self-pay | Admitting: Podiatry

## 2017-05-27 MED ORDER — INSULIN U-100 REGULAR HUMAN 100 UNIT/ML INJECTION SOLUTION
2 refills | 0 days | Status: CP
Start: 2017-05-27 — End: 2017-09-24

## 2017-06-05 MED ORDER — ACETAMINOPHEN 300 MG-CODEINE 30 MG TABLET: 1 | tablet | Freq: Four times a day (QID) | 1 refills | 0 days | Status: AC

## 2017-06-05 MED ORDER — ACETAMINOPHEN 300 MG-CODEINE 30 MG TABLET
ORAL_TABLET | Freq: Four times a day (QID) | ORAL | 1 refills | 0.00000 days | Status: CP | PRN
Start: 2017-06-05 — End: 2017-08-26

## 2017-06-16 MED ORDER — PANTOPRAZOLE 40 MG TABLET,DELAYED RELEASE
ORAL_TABLET | Freq: Every day | ORAL | 3 refills | 0 days | Status: CP
Start: 2017-06-16 — End: 2019-01-06

## 2017-06-18 ENCOUNTER — Ambulatory Visit: Payer: Medicaid Other | Admitting: Podiatry

## 2017-06-18 ENCOUNTER — Other Ambulatory Visit: Payer: Self-pay | Admitting: Podiatry

## 2017-06-18 ENCOUNTER — Encounter: Payer: Self-pay | Admitting: Podiatry

## 2017-06-18 ENCOUNTER — Ambulatory Visit (INDEPENDENT_AMBULATORY_CARE_PROVIDER_SITE_OTHER): Payer: Medicaid Other

## 2017-06-18 DIAGNOSIS — K589 Irritable bowel syndrome without diarrhea: Secondary | ICD-10-CM | POA: Insufficient documentation

## 2017-06-18 DIAGNOSIS — M722 Plantar fascial fibromatosis: Secondary | ICD-10-CM

## 2017-06-18 MED ORDER — MELOXICAM 15 MG PO TABS
15.0000 mg | ORAL_TABLET | Freq: Every day | ORAL | 3 refills | Status: DC
Start: 2017-06-18 — End: 2017-10-13

## 2017-06-18 NOTE — Patient Instructions (Signed)

## 2017-06-18 NOTE — Progress Notes (Signed)
Subjective:  Patient ID: Jason Davidson, male    DOB: [birth year],  MRN: [id number] HPI Chief Complaint  Patient presents with  . Foot Pain    Patient presents today for left heel/foot pain x 6 months.  He has hx of plantar fasciitis and has been given injections by Dr. Graciela Davidson.  He states the pain is worse when he stands up from sitting long periods.  He is also Diabetic and wants to establish care today    37 y.o. male presents with the above complaint.     Past Medical History:  Diagnosis Date  . Asthma   . Depressed   . Diabetes mellitus without complication (HCC)   . Morbid obesity (HCC)    Past Surgical History:  Procedure Laterality Date  . EYE SURGERY      Current Outpatient Medications:  .  ALPRAZolam (XANAX) 0.5 MG tablet, Take 0.5 mg by mouth at bedtime as needed for anxiety., Disp: , Rfl:  .  dicyclomine (BENTYL) 10 MG capsule, Take 10 mg by mouth 3 (three) times daily before meals., Disp: , Rfl:  .  DULoxetine (CYMBALTA) 60 MG capsule, Take 120 mg by mouth daily. , Disp: , Rfl:  .  fluticasone-salmeterol (ADVAIR HFA) 230-21 MCG/ACT inhaler, Inhale 2 puffs into the lungs 2 (two) times daily., Disp: 1 Inhaler, Rfl: 0 .  Fluticasone-Salmeterol (ADVAIR) 500-50 MCG/DOSE AEPB, Inhale 1 puff into the lungs 2 (two) times daily., Disp: , Rfl:  .  gabapentin (NEURONTIN) 100 MG capsule, Take 100 mg by mouth 3 (three) times daily., Disp: , Rfl:  .  glipiZIDE (GLUCOTROL) 10 MG tablet, Take 10 mg by mouth daily before breakfast., Disp: , Rfl:  .  ibuprofen (ADVIL,MOTRIN) 800 MG tablet, Take 1 tablet (800 mg total) by mouth every 8 (eight) hours as needed., Disp: 30 tablet, Rfl: 0 .  insulin glargine (LANTUS) 100 UNIT/ML injection, Inject 50 Units into the skin at bedtime. , Disp: , Rfl:  .  insulin regular (NOVOLIN R,HUMULIN R) 250 units/2.715mL (100 units/mL) injection, Inject into the skin 3 (three) times daily before meals., Disp: , Rfl:  .  pantoprazole (PROTONIX) 40 MG  tablet, Take 40 mg by mouth daily., Disp: , Rfl:  .  propranolol (INNOPRAN XL) 120 MG 24 hr capsule, Take 120 mg by mouth at bedtime., Disp: , Rfl:  .  ranitidine (ZANTAC) 150 MG capsule, Take 150 mg by mouth 2 (two) times daily., Disp: , Rfl:  .  Spacer/Aero-Holding Chambers (AEROCHAMBER PLUS) inhaler, Use as instructed, Disp: 1 each, Rfl: 2 .  SUMAtriptan (IMITREX) 100 MG tablet, Take 100 mg by mouth every 2 (two) hours as needed for migraine. May repeat in 2 hours if headache persists or recurs., Disp: , Rfl:  .  topiramate (TOPAMAX) 50 MG tablet, Take 50 mg by mouth 2 (two) times daily., Disp: , Rfl:   Allergies  Allergen Reactions  . Erythromycin Nausea And Vomiting  . Penicillins Swelling  . Sulfa Antibiotics Nausea And Vomiting   Review of Systems  Cardiovascular: Positive for leg swelling.  Musculoskeletal: Positive for arthralgias.  All other systems reviewed and are negative.  Objective:  There were no vitals filed for this visit.  General: Well developed, nourished, in no acute distress, alert and oriented x3   Dermatological: Skin is warm, dry and supple bilateral. Nails x 10 are well maintained; remaining integument appears unremarkable at this time. There are no open sores, no preulcerative lesions, no rash or signs of infection present.  Vascular: Dorsalis Pedis artery and Posterior Tibial artery pedal pulses are 2/4 bilateral with immedate capillary fill time. Pedal hair growth present. No varicosities and no lower extremity edema present bilateral.   Neruologic: Grossly intact via light touch bilateral. Vibratory intact via tuning fork bilateral. Protective threshold with Semmes Wienstein monofilament intact to all pedal sites bilateral. Patellar and Achilles deep tendon reflexes 2+ bilateral. No Babinski or clonus noted bilateral.   Musculoskeletal: No gross boney pedal deformities bilateral. No pain, crepitus, or limitation noted with foot and ankle range of motion  bilateral. Muscular strength 5/5 in all groups tested bilateral.  Gait: Unassisted, Nonantalgic.    Radiographs:  Radiographs of the foot do demonstrate a soft tissue increase in density at the plantar fascial calcaneal insertion site left heel.  Subtle soft tissue and edema is noted.  Considerable adipose tissue.  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    Plan: We discussed etiology pathology conservative versus surgical therapies.  At this point he is on government assistance and cannot afford any noncovered items.  So orthotics he cannot utilize and shockwave he cannot utilize.  I expressed to him that surgical intervention would necessitate orthotics to help prevent his obesity from pronating his foot.  He understands this.  I did discuss with him about purchasing orthotics and a cheaper rate he will discussed this with billing.  I injected for the first time today his left heel with 20 mg Kenalog 5 mg of Marcaine to the point of maximal tenderness he tolerated procedure well and will follow up with me in 1 month.  I did also placed him on meloxicam.     Jason Davidson, [place]

## 2017-06-23 ENCOUNTER — Ambulatory Visit: Admit: 2017-06-23 | Discharge: 2017-06-24 | Payer: MEDICAID | Attending: Family | Primary: Family

## 2017-06-23 DIAGNOSIS — L03115 Cellulitis of right lower limb: Principal | ICD-10-CM

## 2017-06-23 DIAGNOSIS — G43909 Migraine, unspecified, not intractable, without status migrainosus: Secondary | ICD-10-CM

## 2017-06-23 MED ORDER — ALPRAZOLAM 0.5 MG TABLET
ORAL_TABLET | Freq: Three times a day (TID) | ORAL | 0 refills | 0 days | Status: CP
Start: 2017-06-23 — End: ?

## 2017-06-23 MED ORDER — ERENUMAB-AOOE 70 MG/ML SUBCUTANEOUS AUTO-INJECTOR
SUBCUTANEOUS | 3 refills | 0 days | Status: CP
Start: 2017-06-23 — End: 2017-10-08

## 2017-06-30 ENCOUNTER — Ambulatory Visit: Admit: 2017-06-30 | Discharge: 2017-07-01 | Payer: MEDICAID | Attending: Family | Primary: Family

## 2017-06-30 DIAGNOSIS — L03115 Cellulitis of right lower limb: Secondary | ICD-10-CM

## 2017-06-30 DIAGNOSIS — Z794 Long term (current) use of insulin: Secondary | ICD-10-CM

## 2017-06-30 DIAGNOSIS — E1165 Type 2 diabetes mellitus with hyperglycemia: Principal | ICD-10-CM

## 2017-06-30 DIAGNOSIS — I890 Lymphedema, not elsewhere classified: Secondary | ICD-10-CM

## 2017-06-30 MED ORDER — CLINDAMYCIN HCL 300 MG CAPSULE
ORAL_CAPSULE | Freq: Four times a day (QID) | ORAL | 0 refills | 0.00000 days | Status: CP
Start: 2017-06-30 — End: 2017-07-14

## 2017-07-09 ENCOUNTER — Encounter: Payer: Self-pay | Admitting: Podiatry

## 2017-07-09 ENCOUNTER — Ambulatory Visit: Payer: Medicaid Other | Admitting: Podiatry

## 2017-07-09 DIAGNOSIS — L600 Ingrowing nail: Secondary | ICD-10-CM | POA: Diagnosis not present

## 2017-07-09 MED ORDER — COLCHICINE 0.6 MG TABLET
ORAL_TABLET | Freq: Two times a day (BID) | ORAL | 2 refills | 0 days | Status: CP
Start: 2017-07-09 — End: 2017-07-31

## 2017-07-09 MED ORDER — NEOMYCIN-POLYMYXIN-HC 3.5-10000-1 OT SOLN: OTIC | 0 refills | Status: DC

## 2017-07-09 NOTE — Patient Instructions (Signed)

## 2017-07-09 NOTE — Progress Notes (Signed)
He presents today chief complaint of ingrown toenail to the third digit of the left foot.  Times the past 3-4 days.  Objective: Vital signs are stable he is alert and oriented x3.  Pulses are palpable left.  Ingrown nail fibular border third digit left foot with pain on palpation no purulence is noted.  Assessment paronychia history of diabetes mellitus left third toe.  Plan: Chemical matricectomy was performed today after local anesthesia was measured.  He tolerated procedure well.  He was provided with both oral and written home-going instruction for care and soaking of his toe as well as a prescription for Cortisporin Otic to be applied twice a day after soaking.  I will follow-up with him in 2 weeks

## 2017-07-10 MED ORDER — INSULIN GLARGINE (U-100) 100 UNIT/ML SUBCUTANEOUS SOLUTION
Freq: Every evening | SUBCUTANEOUS | 1 refills | 0 days | Status: CP
Start: 2017-07-10 — End: 2017-08-14

## 2017-07-23 ENCOUNTER — Ambulatory Visit: Payer: Medicaid Other | Admitting: Podiatry

## 2017-07-25 MED ORDER — POLYETHYLENE GLYCOL 3350 17 GRAM/DOSE ORAL POWDER
Freq: Every day | ORAL | 3 refills | 0 days | Status: CP
Start: 2017-07-25 — End: 2017-08-24

## 2017-07-28 ENCOUNTER — Ambulatory Visit (INDEPENDENT_AMBULATORY_CARE_PROVIDER_SITE_OTHER): Payer: Medicaid Other | Admitting: Podiatry

## 2017-07-28 ENCOUNTER — Encounter: Payer: Self-pay | Admitting: Podiatry

## 2017-07-28 DIAGNOSIS — L600 Ingrowing nail: Secondary | ICD-10-CM

## 2017-07-28 NOTE — Progress Notes (Signed)
He presents today for follow-up of his I&D and paronychia third digit of his left foot.  He states that I think it looks yucky but it stopped draining and developed a scab now it hurts when anything touches it.  Objective: Vital signs are stable he is alert and oriented x3 continues to soak in Betadine and warm water twice daily he states.  There is no erythema cellulitis drainage or odor there is a small scab over the fibular margin.  It is mildly tender on palpation.  No purulence no malodor.  Assessment: Resolving paronychia status post I&D third toe left foot.  Plan: Encouraged him to soak in Epsom salts and warm water covered in the day but leave open at bedtime.  Follow-up with me should this not resolve in the next 2-3 weeks.

## 2017-07-28 NOTE — Patient Instructions (Signed)
Epsom Salt Soak Instructions    IF SOAKING IS STILL NECESSARY, START THIS 2 WEEKS AFTER INITIAL PROCEDURE   Place 1/4 cup of epsom salts in a quart of warm tap water.  Soak your foot or feet in the solution for 20 minutes twice a day until you notice the area has dried and a scab has formed. Continue to apply other medications to the area as directed by the doctor such as polysporin, neosporin or cortisporin drops.  IF YOUR SKIN BECOMES IRRITATED WHILE USING THESE INSTRUCTIONS, IT IS OKAY TO SWITCH TO  WHITE VINEGAR AND WATER. Or you may use antibacterial soap and water to keep the toe clean  Monitor for any signs/symptoms of infection. Call the office immediately if any occur or go directly to the emergency room. Call with any questions/concerns.  

## 2017-07-30 ENCOUNTER — Ambulatory Visit: Payer: Medicaid Other | Admitting: Podiatry

## 2017-08-04 ENCOUNTER — Ambulatory Visit (INDEPENDENT_AMBULATORY_CARE_PROVIDER_SITE_OTHER): Payer: Medicaid Other | Admitting: Vascular Surgery

## 2017-08-04 ENCOUNTER — Encounter (INDEPENDENT_AMBULATORY_CARE_PROVIDER_SITE_OTHER): Payer: Self-pay | Admitting: Vascular Surgery

## 2017-08-04 VITALS — BP 130/75 | HR 64 | Resp 19 | Ht 70.0 in | Wt >= 6400 oz

## 2017-08-04 DIAGNOSIS — E1142 Type 2 diabetes mellitus with diabetic polyneuropathy: Secondary | ICD-10-CM

## 2017-08-04 DIAGNOSIS — I890 Lymphedema, not elsewhere classified: Secondary | ICD-10-CM | POA: Diagnosis not present

## 2017-08-04 DIAGNOSIS — Z794 Long term (current) use of insulin: Secondary | ICD-10-CM

## 2017-08-04 DIAGNOSIS — K219 Gastro-esophageal reflux disease without esophagitis: Secondary | ICD-10-CM | POA: Diagnosis not present

## 2017-08-04 DIAGNOSIS — I872 Venous insufficiency (chronic) (peripheral): Secondary | ICD-10-CM | POA: Diagnosis not present

## 2017-08-05 ENCOUNTER — Encounter (INDEPENDENT_AMBULATORY_CARE_PROVIDER_SITE_OTHER): Payer: Self-pay | Admitting: Vascular Surgery

## 2017-08-05 ENCOUNTER — Ambulatory Visit (INDEPENDENT_AMBULATORY_CARE_PROVIDER_SITE_OTHER): Payer: Medicaid Other | Admitting: Vascular Surgery

## 2017-08-05 ENCOUNTER — Other Ambulatory Visit (INDEPENDENT_AMBULATORY_CARE_PROVIDER_SITE_OTHER): Payer: Self-pay | Admitting: Vascular Surgery

## 2017-08-05 ENCOUNTER — Ambulatory Visit (INDEPENDENT_AMBULATORY_CARE_PROVIDER_SITE_OTHER): Payer: Medicaid Other

## 2017-08-05 VITALS — BP 131/73 | HR 61 | Resp 14 | Ht 71.0 in | Wt >= 6400 oz

## 2017-08-05 DIAGNOSIS — M7989 Other specified soft tissue disorders: Secondary | ICD-10-CM

## 2017-08-05 DIAGNOSIS — L03115 Cellulitis of right lower limb: Secondary | ICD-10-CM

## 2017-08-05 DIAGNOSIS — R600 Localized edema: Secondary | ICD-10-CM

## 2017-08-05 DIAGNOSIS — I890 Lymphedema, not elsewhere classified: Secondary | ICD-10-CM | POA: Diagnosis not present

## 2017-08-05 DIAGNOSIS — I872 Venous insufficiency (chronic) (peripheral): Secondary | ICD-10-CM | POA: Diagnosis not present

## 2017-08-05 NOTE — Progress Notes (Signed)
Subjective:    Patient ID: Jason Davidson, male    DOB: 1981-05-21, 37 y.o.   MRN: [id number] Chief Complaint  Patient presents with  . Follow-up    knot on right calf   Patient presents to review vascular studies.  The patient was seen yesterday with a chief complaint of recurrent right lower extremity cellulitis painful varicosities.  The patient has been wearing medical grade 1 compression stockings, elevating his leg and using his lymphedema pump to the bilateral lower extremities for over 6 months (since his last visit).  The patient notes the pain along his varicosities worsens towards the end of the day.  The patient also notes bilateral swelling to the lower extremity which also worsens towards the end of the day or with sitting and standing for long periods of time.  The patient feels his symptoms have progressed to the point that he is unable to function on a daily basis.  The patient feels that his symptoms are lifestyle limiting.  The patient underwent a right lower extremity venous duplex which was notable for reflux in the right great saphenous vein.  No deep vein or superficial vein thrombophlebitis.  Previous the patient denies any fever, nausea vomiting.   Review of Systems  Constitutional: Negative.   HENT: Negative.   Eyes: Negative.   Respiratory: Negative.   Cardiovascular: Positive for leg swelling.  Gastrointestinal: Negative.   Endocrine: Negative.   Genitourinary: Negative.   Musculoskeletal: Negative.   Skin: Negative.   Allergic/Immunologic: Negative.   Neurological: Negative.   Hematological: Negative.   Psychiatric/Behavioral: Negative.       Objective:   Physical Exam  Constitutional: He is oriented to person, place, and time. He appears well-developed and well-nourished. No distress.  HENT:  Head: Normocephalic and atraumatic.  Eyes: Conjunctivae are normal. Pupils are equal, round, and reactive to light.  Neck: Normal range of motion.    Cardiovascular: Normal rate, regular rhythm and intact distal pulses.  Pulses:      Radial pulses are 2+ on the right side, and 2+ on the left side.  Hard to palpate pedal pulses due to edema and body habitus however the patient's bilateral feet are warm  Pulmonary/Chest: Effort normal and breath sounds normal.  Musculoskeletal: Normal range of motion. He exhibits edema (Moderate plus pitting edema noted bilaterally).  Neurological: He is alert and oriented to person, place, and time.  Skin: He is not diaphoretic.  Greater than 1 cm varicosities noted to the right lower extremity.  Less than 1 cm varicosities noted to the left lower extremity.  There is moderate stasis dermatitis, skin thickening to the bilateral lower extremity.  There is no active cellulitis.  Psychiatric: He has a normal mood and affect. His behavior is normal. Judgment and thought content normal.  Vitals reviewed.  BP 131/73   Pulse 61   Resp 14   Ht 5\' 11"  (1.803 m)   Wt (!) 477 lb (216.4 kg)   BMI 66.53 kg/m   Past Medical History:  Diagnosis Date  . Asthma   . Depressed   . Diabetes mellitus without complication (HCC)   . Morbid obesity (HCC)    Social History   Socioeconomic History  . Marital status: Married    Spouse name: Not on file  . Number of children: Not on file  . Years of education: Not on file  . Highest education level: Not on file  Social Needs  . Financial resource strain: Not on  file  . Food insecurity - worry: Not on file  . Food insecurity - inability: Not on file  . Transportation needs - medical: Not on file  . Transportation needs - non-medical: Not on file  Occupational History  . Not on file  Tobacco Use  . Smoking status: Never Smoker  . Smokeless tobacco: Never Used  Substance and Sexual Activity  . Alcohol use: No  . Drug use: No  . Sexual activity: Not on file  Other Topics Concern  . Not on file  Social History Narrative  . Not on file   Past Surgical  History:  Procedure Laterality Date  . EYE SURGERY     Family History  Problem Relation Age of Onset  . Diabetes Mother   . Diabetes Sister   . Diabetes Brother   . Diabetes Maternal Uncle   . Diabetes Maternal Grandmother   . Heart disease Maternal Grandmother   . Cancer Maternal Grandfather   . COPD Paternal Grandmother   . Cancer Paternal Grandfather   . Varicose Veins Paternal Grandfather    Allergies  Allergen Reactions  . Erythromycin Nausea And Vomiting  . Penicillins Swelling  . Sulfa Antibiotics Nausea And Vomiting      Assessment & Plan:  Patient presents to review vascular studies.  The patient was seen yesterday with a chief complaint of recurrent right lower extremity cellulitis painful varicosities.  The patient has been wearing medical grade 1 compression stockings, elevating his leg and using his lymphedema pump to the bilateral lower extremities for over 6 months (since his last visit).  The patient notes the pain along his varicosities worsens towards the end of the day.  The patient also notes bilateral swelling to the lower extremity which also worsens towards the end of the day or with sitting and standing for long periods of time.  The patient feels his symptoms have progressed to the point that he is unable to function on a daily basis.  The patient feels that his symptoms are lifestyle limiting.  The patient underwent a right lower extremity venous duplex which was notable for reflux in the right great saphenous vein.  No deep vein or superficial vein thrombophlebitis.  Previous the patient denies any fever, nausea vomiting.  1. Chronic venous insufficiency - New Patient with venous reflux noted to the great saphenous vein. The patient has not achieved any improvement in his symptoms even though he has been engaging and conservative therapy including wearing medical grade 1 compression stockings and elevating his legs on a daily basis.  The patient is also been  using a lymphedema pump for at least an hour daily. The patient is likely to benefit from endovenous laser ablation. I have discussed the risks and benefits of the procedure. The risks primarily include DVT, recanalization, bleeding, infection, and inability to gain access. I will applied to the patient's insurance for right lower extremity great saphenous vein endovenous laser ablation Call the patient with insurance approval The patient may benefit from undergoing a left lower extremity venous duplex in the future to assess the left great saphenous vein  2. Lymphedema - Stable The patient is to continue wearing medical grade 1 compression stockings, elevating his legs and using his lymphedema pump at least twice a day for an hour each time The patient expresses understanding  3. Cellulitis of right lower extremity - None There is no active cellulitis noted to the patient's lower extremity at this time However if he does not  control his bilateral lower extremity edema better he will be prone to recurrent cellulitis  Current Outpatient Medications on File Prior to Visit  Medication Sig Dispense Refill  . AIMOVIG 70 MG/ML SOAJ   0  . ALPRAZolam (XANAX) 0.5 MG tablet Take 0.5 mg by mouth at bedtime as needed for anxiety.    Marland Kitchen. amphetamine-dextroamphetamine (ADDERALL) 10 MG tablet Take 10 mg by mouth 2 (two) times daily with a meal.    . aspirin EC 81 MG tablet Take 81 mg by mouth daily.    . busPIRone (BUSPAR) 10 MG tablet Take 10 mg by mouth 3 (three) times daily.  0  . Colchicine (MITIGARE) 0.6 MG CAPS Take 1.2 mg on Day 1 of flare, followed by 0.6 mg one hour later. Day 2 take 0.6 mg BID until flare resolves.    . dicyclomine (BENTYL) 10 MG capsule Take 10 mg by mouth 3 (three) times daily before meals.    . DULoxetine (CYMBALTA) 60 MG capsule Take 120 mg by mouth daily.     . Fluticasone-Salmeterol (ADVAIR) 500-50 MCG/DOSE AEPB Inhale 1 puff into the lungs 2 (two) times daily.    Marland Kitchen.  gabapentin (NEURONTIN) 100 MG capsule Take 100 mg by mouth 3 (three) times daily.    Marland Kitchen. glipiZIDE (GLUCOTROL) 10 MG tablet Take 10 mg by mouth daily before breakfast.    . ibuprofen (ADVIL,MOTRIN) 800 MG tablet Take 1 tablet (800 mg total) by mouth every 8 (eight) hours as needed. 30 tablet 0  . insulin glargine (LANTUS) 100 UNIT/ML injection Inject into the skin.    Marland Kitchen. insulin regular (NOVOLIN R,HUMULIN R) 250 units/2.515mL (100 units/mL) injection Inject into the skin 3 (three) times daily before meals.    Marland Kitchen. neomycin-polymyxin-hydrocortisone (CORTISPORIN) OTIC solution Apply one to two drops to toe after soaking twice daily. 10 mL 0  . pantoprazole (PROTONIX) 40 MG tablet Take 40 mg by mouth daily.    . polyethylene glycol powder (GLYCOLAX/MIRALAX) powder take 17GM (DISSOLVED IN WATER) by mouth once daily  0  . PROAIR HFA 108 (90 Base) MCG/ACT inhaler   0  . propranolol (INNOPRAN XL) 120 MG 24 hr capsule Take 120 mg by mouth at bedtime.    . ranitidine (ZANTAC) 150 MG tablet   0  . Spacer/Aero-Holding Chambers (AEROCHAMBER PLUS) inhaler Use as instructed 1 each 2  . SUMAtriptan (IMITREX) 100 MG tablet Take 100 mg by mouth every 2 (two) hours as needed for migraine. May repeat in 2 hours if headache persists or recurs.    . topiramate (TOPAMAX) 50 MG tablet Take 50 mg by mouth 2 (two) times daily.    . meloxicam (MOBIC) 15 MG tablet Take 1 tablet (15 mg total) by mouth daily. (Patient not taking: Reported on 08/05/2017) 30 tablet 3   No current facility-administered medications on file prior to visit.    There are no Patient Instructions on file for this visit. No Follow-up on file.  Stacey Maura A Tiberius Loftus, PA-C

## 2017-08-06 ENCOUNTER — Encounter (INDEPENDENT_AMBULATORY_CARE_PROVIDER_SITE_OTHER): Payer: Self-pay | Admitting: Vascular Surgery

## 2017-08-06 NOTE — Progress Notes (Signed)
MRN : [id number]  Jason Davidson is a 37 y.o. (1980/12/28) male who presents with chief complaint of  Chief Complaint  Patient presents with  . Follow-up    6 month no studies  .  History of Present Illness: The patient returns to the office for followup evaluation regarding leg swelling.  The swelling has improved quite a bit and the pain associated with swelling has decreased substantially. There have not been any interval development of a ulcerations or wounds.  No interval episodes of cellulitis.  Since the previous visit the patient has been wearing graduated compression stockings and has noted little significant improvement in the lymphedema. The patient has been using compression routinely morning until night.  He states he has been using his lymph pump with good results.  Pump is functioning well.  The patient also states elevation during the day and exercise is being done too.     Current Meds  Medication Sig  . AIMOVIG 70 MG/ML SOAJ   . ALPRAZolam (XANAX) 0.5 MG tablet Take 0.5 mg by mouth at bedtime as needed for anxiety.  Marland Kitchen. amphetamine-dextroamphetamine (ADDERALL) 10 MG tablet Take 10 mg by mouth 2 (two) times daily with a meal.  . busPIRone (BUSPAR) 10 MG tablet Take 10 mg by mouth 3 (three) times daily.  . Colchicine (MITIGARE) 0.6 MG CAPS Take 1.2 mg on Day 1 of flare, followed by 0.6 mg one hour later. Day 2 take 0.6 mg BID until flare resolves.  . dicyclomine (BENTYL) 10 MG capsule Take 10 mg by mouth 3 (three) times daily before meals.  . DULoxetine (CYMBALTA) 60 MG capsule Take 120 mg by mouth daily.   . Fluticasone-Salmeterol (ADVAIR) 500-50 MCG/DOSE AEPB Inhale 1 puff into the lungs 2 (two) times daily.  Marland Kitchen. gabapentin (NEURONTIN) 100 MG capsule Take 100 mg by mouth 3 (three) times daily.  Marland Kitchen. glipiZIDE (GLUCOTROL) 10 MG tablet Take 10 mg by mouth daily before breakfast.  . ibuprofen (ADVIL,MOTRIN) 800 MG tablet Take 1 tablet (800 mg total) by mouth every 8  (eight) hours as needed.  . insulin glargine (LANTUS) 100 UNIT/ML injection Inject into the skin.  Marland Kitchen. insulin regular (NOVOLIN R,HUMULIN R) 250 units/2.335mL (100 units/mL) injection Inject into the skin 3 (three) times daily before meals.  . meloxicam (MOBIC) 15 MG tablet Take 1 tablet (15 mg total) by mouth daily. (Patient not taking: Reported on 08/05/2017)  . neomycin-polymyxin-hydrocortisone (CORTISPORIN) OTIC solution Apply one to two drops to toe after soaking twice daily.  . pantoprazole (PROTONIX) 40 MG tablet Take 40 mg by mouth daily.  . polyethylene glycol powder (GLYCOLAX/MIRALAX) powder take 17GM (DISSOLVED IN WATER) by mouth once daily  . PROAIR HFA 108 (90 Base) MCG/ACT inhaler   . propranolol (INNOPRAN XL) 120 MG 24 hr capsule Take 120 mg by mouth at bedtime.  . ranitidine (ZANTAC) 150 MG tablet   . Spacer/Aero-Holding Chambers (AEROCHAMBER PLUS) inhaler Use as instructed  . SUMAtriptan (IMITREX) 100 MG tablet Take 100 mg by mouth every 2 (two) hours as needed for migraine. May repeat in 2 hours if headache persists or recurs.  . topiramate (TOPAMAX) 50 MG tablet Take 50 mg by mouth 2 (two) times daily.    Past Medical History:  Diagnosis Date  . Asthma   . Depressed   . Diabetes mellitus without complication (HCC)   . Morbid obesity (HCC)     Past Surgical History:  Procedure Laterality Date  . EYE SURGERY  Social History Social History   Tobacco Use  . Smoking status: Never Smoker  . Smokeless tobacco: Never Used  Substance Use Topics  . Alcohol use: No  . Drug use: No    Family History Family History  Problem Relation Age of Onset  . Diabetes Mother   . Diabetes Sister   . Diabetes Brother   . Diabetes Maternal Uncle   . Diabetes Maternal Grandmother   . Heart disease Maternal Grandmother   . Cancer Maternal Grandfather   . COPD Paternal Grandmother   . Cancer Paternal Grandfather   . Varicose Veins Paternal Grandfather     Allergies    Allergen Reactions  . Erythromycin Nausea And Vomiting  . Penicillins Swelling  . Sulfa Antibiotics Nausea And Vomiting     REVIEW OF SYSTEMS (Negative unless checked)  Constitutional: [] Weight loss  [] Fever  [] Chills Cardiac: [] Chest pain   [] Chest pressure   [] Palpitations   [] Shortness of breath when laying flat   [] Shortness of breath with exertion. Vascular:  [] Pain in legs with walking   [] Pain in legs at rest  [] History of DVT   [] Phlebitis   [x] Swelling in legs   [] Varicose veins   [] Non-healing ulcers Pulmonary:   [] Uses home oxygen   [] Productive cough   [] Hemoptysis   [] Wheeze  [] COPD   [] Asthma Neurologic:  [] Dizziness   [] Seizures   [] History of stroke   [] History of TIA  [] Aphasia   [] Vissual changes   [] Weakness or numbness in arm   [] Weakness or numbness in leg Musculoskeletal:   [x] Joint swelling   [x] Joint pain   [] Low back pain Hematologic:  [] Easy bruising  [] Easy bleeding   [] Hypercoagulable state   [] Anemic Gastrointestinal:  [] Diarrhea   [] Vomiting  [] Gastroesophageal reflux/heartburn   [] Difficulty swallowing. Genitourinary:  [] Chronic kidney disease   [] Difficult urination  [] Frequent urination   [] Blood in urine Skin:  [] Rashes   [] Ulcers  Psychological:  [] History of anxiety   []  History of major depression.  Physical Examination  Vitals:   08/04/17 1539  BP: 130/75  Pulse: 64  Resp: 19  Weight: (!) 480 lb (217.7 kg)  Height: 5\' 10"  (1.778 m)   Body mass index is 68.87 kg/m. Gen: WD/WN, NAD Head: [place]/AT, No temporalis wasting.  Ear/Nose/Throat: Hearing grossly intact, nares w/o erythema or drainage Eyes: PER, EOMI, sclera nonicteric.  Neck: Supple, no large masses.   Pulmonary:  Good air movement, no audible wheezing bilaterally, no use of accessory muscles.  Cardiac: RRR, no JVD Vascular: scattered small varicosities present bilaterally.  Moderate venous stasis changes to the legs bilaterally.  3+ soft pitting edema Vessel Right Left  Radial  Palpable Palpable  PT Palpable Palpable  DP Palpable Palpable  Gastrointestinal: Non-distended. No guarding/no peritoneal signs.  Musculoskeletal: M/S 5/5 throughout.  No deformity or atrophy.  Neurologic: CN 2-12 intact. Symmetrical.  Speech is fluent. Motor exam as listed above. Psychiatric: Judgment intact, Mood & affect appropriate for pt's clinical situation. Dermatologic: No rashes or ulcers noted.  No changes consistent with cellulitis. Lymph : No lichenification or skin changes of chronic lymphedema.  CBC Lab Results  Component Value Date   WBC 8.0 02/14/2014   HGB 14.8 02/14/2014   HCT 43.4 02/14/2014   MCV 94 02/14/2014   PLT 175 02/14/2014    BMET    Component Value Date/Time   NA 137 02/14/2014 0437   K 3.8 02/14/2014 0437   CL 101 02/14/2014 0437   CO2 26 02/14/2014 0437  GLUCOSE 307 (H) 02/14/2014 0437   BUN 12 02/14/2014 0437   CREATININE 1.01 02/14/2014 0437   CALCIUM 8.4 (L) 02/14/2014 0437   GFRNONAA >60 02/14/2014 0437   GFRAA >60 02/14/2014 0437   CrCl cannot be calculated (Patient's most recent lab result is older than the maximum 21 days allowed.).  COAG No results found for: INR, PROTIME  Radiology No results found.  Assessment/Plan 1. Lymphedema  No surgery or intervention at this point in time.    I have reviewed my discussion with the patient regarding lymphedema and why it  causes symptoms.  Patient will continue wearing graduated compression stockings class 1 (20-30 mmHg) on a daily basis a prescription was given. The patient is reminded to put the stockings on first thing in the morning and removing them in the evening. The patient is instructed specifically not to sleep in the stockings.   In addition, behavioral modification throughout the day will be continued.  This will include frequent elevation (such as in a recliner), use of over the counter pain medications as needed and exercise such as walking.  I have reviewed systemic causes  for chronic edema such as liver, kidney and cardiac etiologies and there does not appear to be any significant changes in these organ systems over the past year.  The patient is under the impression that these organ systems are all stable and unchanged.    The patient will continue aggressive use of the  lymph pump.  This will continue to improve the edema control and prevent sequela such as ulcers and infections.   The patient will follow-up with me on an annual basis.    2. Chronic venous insufficiency  No surgery or intervention at this point in time.    I have reviewed my discussion with the patient regarding lymphedema and why it  causes symptoms.  Patient will continue wearing graduated compression stockings class 1 (20-30 mmHg) on a daily basis a prescription was given. The patient is reminded to put the stockings on first thing in the morning and removing them in the evening. The patient is instructed specifically not to sleep in the stockings.   In addition, behavioral modification throughout the day will be continued.  This will include frequent elevation (such as in a recliner), use of over the counter pain medications as needed and exercise such as walking.  I have reviewed systemic causes for chronic edema such as liver, kidney and cardiac etiologies and there does not appear to be any significant changes in these organ systems over the past year.  The patient is under the impression that these organ systems are all stable and unchanged.    The patient will continue aggressive use of the  lymph pump.  This will continue to improve the edema control and prevent sequela such as ulcers and infections.   The patient will follow-up with me on an annual basis.    3. Gastroesophageal reflux disease without esophagitis Continue antihypertensive medications as already ordered, these medications have been reviewed and there are no changes at this time.  Avoidence of caffeine and  alcohol  Moderate elevation of the head of the bed   4. Type 2 diabetes mellitus with diabetic polyneuropathy, with long-term current use of insulin (HCC) Continue hypoglycemic medications as already ordered, these medications have been reviewed and there are no changes at this time.  Hgb A1C to be monitored as already arranged by primary service     Levora Dredge, MD  08/06/2017 9:38 AM

## 2017-08-08 MED ORDER — PROPRANOLOL ER 120 MG CAPSULE,24 HR,EXTENDED RELEASE
ORAL_CAPSULE | Freq: Every day | ORAL | 1 refills | 0.00000 days | Status: CP
Start: 2017-08-08 — End: 2018-04-10

## 2017-08-14 MED ORDER — INSULIN GLARGINE (U-100) 100 UNIT/ML SUBCUTANEOUS SOLUTION
Freq: Every evening | SUBCUTANEOUS | 1 refills | 0 days | Status: CP
Start: 2017-08-14 — End: 2018-02-08

## 2017-08-18 MED ORDER — TOPIRAMATE 50 MG TABLET
ORAL_TABLET | Freq: Two times a day (BID) | ORAL | 1 refills | 0 days | Status: CP
Start: 2017-08-18 — End: 2017-11-18

## 2017-08-18 MED ORDER — AEROCHAMBER PLUS FLOW-VU
0 refills | 0 days | Status: CP
Start: 2017-08-18 — End: 2018-08-19

## 2017-08-18 MED ORDER — ALBUTEROL SULFATE HFA 90 MCG/ACTUATION AEROSOL INHALER
RESPIRATORY_TRACT | 1 refills | 0 days | Status: CP | PRN
Start: 2017-08-18 — End: 2017-12-16

## 2017-08-25 ENCOUNTER — Encounter (INDEPENDENT_AMBULATORY_CARE_PROVIDER_SITE_OTHER): Payer: Self-pay | Admitting: Vascular Surgery

## 2017-08-26 MED ORDER — INSULIN SYRINGE U-100 WITH NEEDLE 1 ML 31 GAUGE X 5/16" (8 MM)
5 refills | 0 days | Status: CP
Start: 2017-08-26 — End: 2018-04-11

## 2017-08-30 MED ORDER — ACETAMINOPHEN 300 MG-CODEINE 30 MG TABLET
ORAL_TABLET | Freq: Four times a day (QID) | ORAL | 0 refills | 0.00000 days | Status: CP | PRN
Start: 2017-08-30 — End: 2018-07-08

## 2017-09-04 MED ORDER — PROMETHAZINE 25 MG TABLET
ORAL_TABLET | Freq: Every evening | ORAL | 2 refills | 0.00000 days | Status: CP | PRN
Start: 2017-09-04 — End: 2017-11-15

## 2017-09-04 MED ORDER — ONDANSETRON HCL 4 MG TABLET
ORAL_TABLET | Freq: Two times a day (BID) | ORAL | 2 refills | 0.00000 days | Status: CP | PRN
Start: 2017-09-04 — End: 2017-11-15

## 2017-09-08 MED ORDER — BLOOD-GLUCOSE METER
0 refills | 0 days | Status: CP
Start: 2017-09-08 — End: 2018-05-03

## 2017-09-11 MED ORDER — BLOOD SUGAR DIAGNOSTIC STRIPS
ORAL_STRIP | Freq: Three times a day (TID) | 1 refills | 0.00000 days | Status: CP
Start: 2017-09-11 — End: 2018-05-10

## 2017-09-17 ENCOUNTER — Ambulatory Visit: Admit: 2017-09-17 | Discharge: 2017-09-18 | Payer: MEDICAID

## 2017-09-17 DIAGNOSIS — H3322 Serous retinal detachment, left eye: Secondary | ICD-10-CM

## 2017-09-17 DIAGNOSIS — Q141 Congenital malformation of retina: Secondary | ICD-10-CM

## 2017-09-17 DIAGNOSIS — Z0389 Encounter for observation for other suspected diseases and conditions ruled out: Principal | ICD-10-CM

## 2017-09-24 ENCOUNTER — Ambulatory Visit: Payer: Medicaid Other | Admitting: Podiatry

## 2017-09-24 MED ORDER — INSULIN U-100 REGULAR HUMAN 100 UNIT/ML INJECTION SOLUTION
2 refills | 0 days | Status: CP
Start: 2017-09-24 — End: 2017-12-23

## 2017-09-25 ENCOUNTER — Other Ambulatory Visit (INDEPENDENT_AMBULATORY_CARE_PROVIDER_SITE_OTHER): Payer: Medicaid Other | Admitting: Vascular Surgery

## 2017-09-29 ENCOUNTER — Encounter (INDEPENDENT_AMBULATORY_CARE_PROVIDER_SITE_OTHER): Payer: Medicaid Other

## 2017-10-06 ENCOUNTER — Ambulatory Visit: Admit: 2017-10-06 | Discharge: 2017-10-07 | Payer: MEDICAID | Attending: Family | Primary: Family

## 2017-10-06 DIAGNOSIS — R062 Wheezing: Secondary | ICD-10-CM

## 2017-10-06 DIAGNOSIS — E1165 Type 2 diabetes mellitus with hyperglycemia: Principal | ICD-10-CM

## 2017-10-06 DIAGNOSIS — Z794 Long term (current) use of insulin: Secondary | ICD-10-CM

## 2017-10-06 DIAGNOSIS — F3342 Major depressive disorder, recurrent, in full remission: Secondary | ICD-10-CM

## 2017-10-06 DIAGNOSIS — Z6841 Body Mass Index (BMI) 40.0 and over, adult: Secondary | ICD-10-CM

## 2017-10-06 DIAGNOSIS — J3089 Other allergic rhinitis: Secondary | ICD-10-CM

## 2017-10-07 ENCOUNTER — Ambulatory Visit: Admit: 2017-10-07 | Discharge: 2017-10-07 | Payer: MEDICAID

## 2017-10-07 DIAGNOSIS — R062 Wheezing: Principal | ICD-10-CM

## 2017-10-08 ENCOUNTER — Ambulatory Visit: Payer: Medicaid Other | Admitting: Podiatry

## 2017-10-08 MED ORDER — ERENUMAB-AOOE 70 MG/ML SUBCUTANEOUS AUTO-INJECTOR
SUBCUTANEOUS | 3 refills | 0.00000 days | Status: CP
Start: 2017-10-08 — End: 2018-05-18

## 2017-10-08 MED ORDER — ERENUMAB-AOOE 70 MG/ML SUBCUTANEOUS AUTO-INJECTOR: 70 mg | mL | 3 refills | 0 days | Status: AC

## 2017-10-13 ENCOUNTER — Ambulatory Visit: Payer: Medicaid Other

## 2017-10-13 ENCOUNTER — Ambulatory Visit: Payer: Medicaid Other | Admitting: Podiatry

## 2017-10-13 ENCOUNTER — Encounter: Payer: Self-pay | Admitting: Podiatry

## 2017-10-13 DIAGNOSIS — M7751 Other enthesopathy of right foot: Secondary | ICD-10-CM

## 2017-10-13 DIAGNOSIS — M79676 Pain in unspecified toe(s): Secondary | ICD-10-CM

## 2017-10-13 DIAGNOSIS — B351 Tinea unguium: Secondary | ICD-10-CM | POA: Diagnosis not present

## 2017-10-13 DIAGNOSIS — E1142 Type 2 diabetes mellitus with diabetic polyneuropathy: Secondary | ICD-10-CM | POA: Diagnosis not present

## 2017-10-13 DIAGNOSIS — M779 Enthesopathy, unspecified: Principal | ICD-10-CM

## 2017-10-13 DIAGNOSIS — M778 Other enthesopathies, not elsewhere classified: Secondary | ICD-10-CM

## 2017-10-14 NOTE — Progress Notes (Signed)
He presents today with chief complaint of painful elongated toenails states that he is a diabetic and he will need his nails checked by his podiatrist and his primary care thought this would be a good idea.  He also relates that there is been pain recently and his right ankle as he points to the sinus tarsi area of his right foot.  He denies any trauma.  Objective: Vital signs are stable he is alert and oriented x3.  Pulses remain palpable neurologic sensorium is intact deep tendon reflexes are intact muscle strength is normal symmetrical bilateral.  He has pain on palpation and end range of motion of not only sinus tarsi but the subtalar joint of the right foot.  There is no crepitation on range of motion.  His toenails are long thick yellow dystrophic-like mycotic brittle.  Assessment: Diabetes mellitus.  Pain in limb secondary to onychomycosis.  Pain in limb secondary to sinus tarsitis and capsulitis of the subtalar joint right foot.  Plan: Debrided toenails 1 through 5 bilateral cover service.  Also injected 20 mg of Kenalog 5 mg Marcaine to the point of maximal tenderness of Sinus tarsi after sterile Betadine skin prep.  Tolerated procedure well without complications.  Follow-up with him in 6 months

## 2017-10-16 ENCOUNTER — Encounter (INDEPENDENT_AMBULATORY_CARE_PROVIDER_SITE_OTHER): Payer: Self-pay | Admitting: Vascular Surgery

## 2017-10-16 ENCOUNTER — Other Ambulatory Visit (INDEPENDENT_AMBULATORY_CARE_PROVIDER_SITE_OTHER): Payer: Self-pay | Admitting: Vascular Surgery

## 2017-10-16 ENCOUNTER — Ambulatory Visit (INDEPENDENT_AMBULATORY_CARE_PROVIDER_SITE_OTHER): Payer: Medicaid Other | Admitting: Vascular Surgery

## 2017-10-16 ENCOUNTER — Ambulatory Visit: Admit: 2017-10-16 | Discharge: 2017-10-17 | Payer: MEDICAID

## 2017-10-16 VITALS — BP 170/87 | HR 77 | Resp 18 | Ht 71.0 in | Wt >= 6400 oz

## 2017-10-16 DIAGNOSIS — I872 Venous insufficiency (chronic) (peripheral): Secondary | ICD-10-CM

## 2017-10-16 DIAGNOSIS — I83813 Varicose veins of bilateral lower extremities with pain: Secondary | ICD-10-CM | POA: Insufficient documentation

## 2017-10-16 DIAGNOSIS — I83812 Varicose veins of left lower extremities with pain: Secondary | ICD-10-CM

## 2017-10-16 DIAGNOSIS — I8393 Asymptomatic varicose veins of bilateral lower extremities: Secondary | ICD-10-CM

## 2017-10-16 DIAGNOSIS — I83811 Varicose veins of right lower extremities with pain: Secondary | ICD-10-CM

## 2017-10-16 NOTE — Progress Notes (Signed)
    MRN : [id number]  Jason Davidson is a 37 y.o. (29-May-1981) male who presents with chief complaint of  Chief Complaint  Patient presents with  . Follow-up    Right Leg GSV laser ablation  .    The patient's right lower extremity was sterilely prepped and draped.  The ultrasound machine was used to visualize the great saphenous vein throughout its course.  A segment just above the knee was selected for access.  The saphenous vein was accessed without difficulty using ultrasound guidance with a micropuncture needle.   An 0.018  wire was placed beyond the saphenofemoral junction through the sheath and the microneedle was removed.  The 65 cm sheath was then placed over the wire and the wire and dilator were removed.  The laser fiber was placed through the sheath and its tip was placed approximately 2 cm below the saphenofemoral junction.  Tumescent anesthesia was then created with a dilute lidocaine solution.  Laser energy was then delivered with constant withdrawal of the sheath and laser fiber.  Approximately 1350 Joules of energy were delivered over a length of 31 cm.  Sterile dressings were placed.  The patient tolerated the procedure well without complications.

## 2017-10-20 ENCOUNTER — Ambulatory Visit (INDEPENDENT_AMBULATORY_CARE_PROVIDER_SITE_OTHER): Payer: Medicaid Other

## 2017-10-20 DIAGNOSIS — I8391 Asymptomatic varicose veins of right lower extremity: Secondary | ICD-10-CM

## 2017-10-20 DIAGNOSIS — I8393 Asymptomatic varicose veins of bilateral lower extremities: Secondary | ICD-10-CM

## 2017-10-20 MED ORDER — ACETAMINOPHEN 300 MG-CODEINE 30 MG TABLET
ORAL_TABLET | Freq: Four times a day (QID) | ORAL | 0 refills | 0.00000 days | Status: CP | PRN
Start: 2017-10-20 — End: 2018-06-18

## 2017-11-10 ENCOUNTER — Ambulatory Visit (INDEPENDENT_AMBULATORY_CARE_PROVIDER_SITE_OTHER): Payer: Medicaid Other | Admitting: Vascular Surgery

## 2017-11-18 MED ORDER — ONDANSETRON HCL 4 MG TABLET
ORAL_TABLET | Freq: Two times a day (BID) | ORAL | 2 refills | 0 days | Status: CP | PRN
Start: 2017-11-18 — End: 2018-02-08

## 2017-11-18 MED ORDER — TOPIRAMATE 25 MG TABLET
ORAL_TABLET | Freq: Two times a day (BID) | ORAL | 1 refills | 0 days | Status: CP
Start: 2017-11-18 — End: 2018-12-10

## 2017-11-18 MED ORDER — PROMETHAZINE 25 MG TABLET
ORAL_TABLET | Freq: Every evening | ORAL | 2 refills | 0.00000 days | Status: CP | PRN
Start: 2017-11-18 — End: 2018-02-08

## 2017-11-21 MED ORDER — MONTELUKAST 10 MG TABLET
ORAL_TABLET | Freq: Every day | ORAL | 1 refills | 0 days | Status: CP
Start: 2017-11-21 — End: 2018-06-14

## 2017-11-24 ENCOUNTER — Ambulatory Visit
Admit: 2017-11-24 | Discharge: 2017-11-25 | Payer: MEDICAID | Attending: Physician Assistant | Primary: Physician Assistant

## 2017-11-24 DIAGNOSIS — Z9989 Dependence on other enabling machines and devices: Secondary | ICD-10-CM

## 2017-11-24 DIAGNOSIS — G4733 Obstructive sleep apnea (adult) (pediatric): Secondary | ICD-10-CM

## 2017-11-24 DIAGNOSIS — G4761 Periodic limb movement disorder: Secondary | ICD-10-CM

## 2017-11-24 DIAGNOSIS — E611 Iron deficiency: Principal | ICD-10-CM

## 2017-11-25 ENCOUNTER — Other Ambulatory Visit: Admit: 2017-11-25 | Discharge: 2017-11-26 | Payer: MEDICAID

## 2017-11-25 DIAGNOSIS — E611 Iron deficiency: Principal | ICD-10-CM

## 2017-11-27 ENCOUNTER — Encounter (INDEPENDENT_AMBULATORY_CARE_PROVIDER_SITE_OTHER): Payer: Self-pay | Admitting: Vascular Surgery

## 2017-11-27 ENCOUNTER — Ambulatory Visit (INDEPENDENT_AMBULATORY_CARE_PROVIDER_SITE_OTHER): Payer: Medicaid Other | Admitting: Vascular Surgery

## 2017-11-27 VITALS — BP 129/71 | HR 75 | Resp 18 | Ht 71.0 in | Wt >= 6400 oz

## 2017-11-27 DIAGNOSIS — I83813 Varicose veins of bilateral lower extremities with pain: Secondary | ICD-10-CM | POA: Diagnosis not present

## 2017-11-27 DIAGNOSIS — I872 Venous insufficiency (chronic) (peripheral): Secondary | ICD-10-CM

## 2017-11-27 NOTE — Progress Notes (Signed)
Subjective:    Patient ID: Jason Davidson, male    DOB: 1981-06-07, 37 y.o.   MRN: [id number] Chief Complaint  Patient presents with  . Follow-up    3-4wk post laser   The patient presents for his first post endovenous laser ablation follow-up.  On Oct 16, 2017, the patient underwent a right greater saphenous vein endovenous laser ablation.  The patient's follow-up ultrasound was notable for an ablated great saphenous vein.  The patient notes that his post ablation course has been unremarkable.  The patient continues to engage in conservative therapy including wearing medical grade 1 compression socks, elevating his legs and remaining active however notes that the varicosities to the right lower extremity continue to cause him discomfort.  The patient notes that this discomfort has progressed to the point that he is unable to function on a daily basis and they have become lifestyle limiting.  The patient denies any claudication-like symptoms rest pain or ulceration to the right lower extremity.  The patient denies any fever, nausea or vomiting.  Review of Systems  Constitutional: Negative.   HENT: Negative.   Eyes: Negative.   Respiratory: Negative.   Cardiovascular:       Painful varicose veins  Gastrointestinal: Negative.   Endocrine: Negative.   Genitourinary: Negative.   Musculoskeletal: Negative.   Skin: Negative.   Allergic/Immunologic: Negative.   Neurological: Negative.   Hematological: Negative.   Psychiatric/Behavioral: Negative.       Objective:   Physical Exam  Constitutional: He is oriented to person, place, and time. He appears well-developed and well-nourished. No distress.  HENT:  Head: Normocephalic and atraumatic.  Right Ear: External ear normal.  Left Ear: External ear normal.  Eyes: Pupils are equal, round, and reactive to light. Conjunctivae and EOM are normal.  Neck: Normal range of motion.  Cardiovascular: Normal rate, regular rhythm and normal heart  sounds.  Pulmonary/Chest: Effort normal and breath sounds normal.  Musculoskeletal: Normal range of motion. He exhibits edema (Mild bilateral lower extremity edema noted).  Neurological: He is alert and oriented to person, place, and time.  Skin: He is not diaphoretic.  Mixture of less than 1 cm and greater than 1 cm varicosities noted to the right lower extremity.  There is a cluster of greater than 1 cm varicosities noted to the medial ankle.  Psychiatric: He has a normal mood and affect. His behavior is normal. Judgment and thought content normal.  Vitals reviewed.  BP 129/71 (BP Location: Right Arm)   Pulse 75   Resp 18   Ht 5\' 11"  (1.803 m)   Wt (!) 483 lb (219.1 kg)   BMI 67.36 kg/m   Past Medical History:  Diagnosis Date  . Asthma   . Depressed   . Diabetes mellitus without complication (HCC)   . Morbid obesity (HCC)    Social History   Socioeconomic History  . Marital status: Married    Spouse name: Not on file  . Number of children: Not on file  . Years of education: Not on file  . Highest education level: Not on file  Occupational History  . Not on file  Social Needs  . Financial resource strain: Not on file  . Food insecurity:    Worry: Not on file    Inability: Not on file  . Transportation needs:    Medical: Not on file    Non-medical: Not on file  Tobacco Use  . Smoking status: Never Smoker  . Smokeless  tobacco: Never Used  Substance and Sexual Activity  . Alcohol use: No  . Drug use: No  . Sexual activity: Not on file  Lifestyle  . Physical activity:    Days per week: Not on file    Minutes per session: Not on file  . Stress: Not on file  Relationships  . Social connections:    Talks on phone: Not on file    Gets together: Not on file    Attends religious service: Not on file    Active member of club or organization: Not on file    Attends meetings of clubs or organizations: Not on file    Relationship status: Not on file  . Intimate  partner violence:    Fear of current or ex partner: Not on file    Emotionally abused: Not on file    Physically abused: Not on file    Forced sexual activity: Not on file  Other Topics Concern  . Not on file  Social History Narrative  . Not on file   Past Surgical History:  Procedure Laterality Date  . EYE SURGERY     Family History  Problem Relation Age of Onset  . Diabetes Mother   . Diabetes Sister   . Diabetes Brother   . Diabetes Maternal Uncle   . Diabetes Maternal Grandmother   . Heart disease Maternal Grandmother   . Cancer Maternal Grandfather   . COPD Paternal Grandmother   . Cancer Paternal Grandfather   . Varicose Veins Paternal Grandfather    Allergies  Allergen Reactions  . Erythromycin Nausea And Vomiting  . Penicillins Swelling  . Sulfa Antibiotics Nausea And Vomiting      Assessment & Plan:  The patient presents for his first post endovenous laser ablation follow-up.  On Oct 16, 2017, the patient underwent a right greater saphenous vein endovenous laser ablation.  The patient's follow-up ultrasound was notable for an ablated great saphenous vein.  The patient notes that his post ablation course has been unremarkable.  The patient continues to engage in conservative therapy including wearing medical grade 1 compression socks, elevating his legs and remaining active however notes that the varicosities to the right lower extremity continue to cause him discomfort.  The patient notes that this discomfort has progressed to the point that he is unable to function on a daily basis and they have become lifestyle limiting.  The patient denies any claudication-like symptoms rest pain or ulceration to the right lower extremity.  The patient denies any fever, nausea or vomiting.  1. Varicose veins of both lower extremities with pain - Stable The patient is status post endovenous laser ablation to the right great saphenous vein on Oct 16, 2017. Even though the patient has  been engaging in conservative therapy including wearing medical grade 1 compression socks elevating his legs, remaining active and using his lymphedema pump he is still experiencing discomfort from the varicosities located to the right lower extremity The patient would benefit from saline sclerotherapy into these varicosities I will apply to the patient's insurance The patient is to continue engaging in conservative therapy including using his lymphedema pump twice a day for an hour each time  2. Chronic venous insufficiency - Stable As above  Current Outpatient Medications on File Prior to Visit  Medication Sig Dispense Refill  . AIMOVIG 70 MG/ML SOAJ   0  . ALPRAZolam (XANAX) 0.5 MG tablet Take 0.5 mg by mouth at bedtime as needed for anxiety.    Marland Kitchen  amphetamine-dextroamphetamine (ADDERALL) 10 MG tablet Take 10 mg by mouth 2 (two) times daily with a meal.    . aspirin EC 81 MG tablet Take 81 mg by mouth daily.    . busPIRone (BUSPAR) 10 MG tablet Take 10 mg by mouth 3 (three) times daily.  0  . cetirizine (ZYRTEC) 10 MG tablet Take by mouth.    . Colchicine (MITIGARE) 0.6 MG CAPS Take 1.2 mg on Day 1 of flare, followed by 0.6 mg one hour later. Day 2 take 0.6 mg BID until flare resolves.    . cyclobenzaprine (FLEXERIL) 10 MG tablet Take by mouth.    . diclofenac (VOLTAREN) 50 MG EC tablet Take by mouth.    . dicyclomine (BENTYL) 10 MG capsule Take 10 mg by mouth 3 (three) times daily before meals.    . divalproex (DEPAKOTE SPRINKLE) 125 MG capsule Take by mouth.    . DULoxetine (CYMBALTA) 60 MG capsule Take 120 mg by mouth daily.     . fluticasone (FLONASE) 50 MCG/ACT nasal spray fluticasone propionate 50 mcg/actuation nasal spray,suspension    . Fluticasone-Salmeterol (ADVAIR) 500-50 MCG/DOSE AEPB Inhale 1 puff into the lungs 2 (two) times daily.    Marland Kitchen gabapentin (NEURONTIN) 100 MG capsule Take 100 mg by mouth 3 (three) times daily.    Marland Kitchen glipiZIDE (GLUCOTROL) 10 MG tablet Take 10 mg by mouth  daily before breakfast.    . glucose blood (PRECISION QID TEST) test strip Use 1 strip 3 times a day with meter    . hydrocortisone (ANUSOL-HC) 25 MG suppository Place rectally.    Marland Kitchen ibuprofen (ADVIL,MOTRIN) 800 MG tablet Take 1 tablet (800 mg total) by mouth every 8 (eight) hours as needed. 30 tablet 0  . insulin glargine (LANTUS) 100 UNIT/ML injection Inject into the skin.    Marland Kitchen insulin regular (NOVOLIN R,HUMULIN R) 250 units/2.51mL (100 units/mL) injection Inject into the skin 3 (three) times daily before meals.    . Insulin Syringe-Needle U-100 (BD INSULIN SYRINGE U/F) 31G X 5/16" 1 ML MISC Use as directed with the Lantus Insulin nightly    . loperamide (IMODIUM) 2 MG capsule Take by mouth.    . Melatonin 3 MG TABS Take by mouth.    . montelukast (SINGULAIR) 10 MG tablet Take by mouth.    . ondansetron (ZOFRAN) 4 MG tablet Take by mouth.    . pantoprazole (PROTONIX) 40 MG tablet Take 40 mg by mouth daily.    . polyethylene glycol powder (GLYCOLAX/MIRALAX) powder take 17GM (DISSOLVED IN WATER) by mouth once daily  0  . PROAIR HFA 108 (90 Base) MCG/ACT inhaler   0  . promethazine (PHENERGAN) 25 MG tablet Take by mouth.    . propranolol (INNOPRAN XL) 120 MG 24 hr capsule Take 120 mg by mouth at bedtime.    . propranolol ER (INDERAL LA) 120 MG 24 hr capsule Take by mouth.    . ranitidine (ZANTAC) 150 MG tablet   0  . Spacer/Aero-Holding Chambers (AEROCHAMBER PLUS) inhaler Use as instructed 1 each 2  . SUMAtriptan (IMITREX) 100 MG tablet Take 100 mg by mouth every 2 (two) hours as needed for migraine. May repeat in 2 hours if headache persists or recurs.    . topiramate (TOPAMAX) 50 MG tablet Take 50 mg by mouth 2 (two) times daily.     No current facility-administered medications on file prior to visit.    There are no Patient Instructions on file for this visit. No follow-ups on file.  Natayah Warmack A Denya Buckingham, PA-C

## 2017-11-28 MED ORDER — OXYCODONE 5 MG TABLET
ORAL_TABLET | ORAL | 0 refills | 0 days | Status: CP | PRN
Start: 2017-11-28 — End: 2019-01-06

## 2017-11-28 MED ORDER — MITIGARE 0.6 MG CAPSULE
ORAL_CAPSULE | 3 refills | 0 days | Status: CP
Start: 2017-11-28 — End: 2019-01-06

## 2017-12-02 MED ORDER — GLIPIZIDE 10 MG TABLET
ORAL_TABLET | Freq: Two times a day (BID) | ORAL | 1 refills | 0.00000 days | Status: CP
Start: 2017-12-02 — End: 2018-05-21

## 2017-12-02 MED ORDER — DICYCLOMINE 10 MG CAPSULE
ORAL_CAPSULE | Freq: Three times a day (TID) | ORAL | 0 refills | 0.00000 days | Status: CP
Start: 2017-12-02 — End: 2018-01-05

## 2017-12-03 MED ORDER — NAPROXEN 500 MG TABLET
ORAL_TABLET | Freq: Two times a day (BID) | ORAL | 0 refills | 0 days | Status: CP
Start: 2017-12-03 — End: 2017-12-30

## 2017-12-16 MED ORDER — PROAIR HFA 90 MCG/ACTUATION AEROSOL INHALER
1 refills | 0 days | Status: CP
Start: 2017-12-16 — End: 2018-04-10

## 2017-12-19 ENCOUNTER — Encounter (INDEPENDENT_AMBULATORY_CARE_PROVIDER_SITE_OTHER): Payer: Self-pay | Admitting: Vascular Surgery

## 2017-12-23 DIAGNOSIS — J309 Allergic rhinitis, unspecified: Secondary | ICD-10-CM | POA: Insufficient documentation

## 2017-12-23 MED ORDER — INSULIN U-100 REGULAR HUMAN 100 UNIT/ML INJECTION SOLUTION
2 refills | 0 days | Status: CP
Start: 2017-12-23 — End: 2018-04-20

## 2017-12-31 MED ORDER — NAPROXEN 500 MG TABLET
ORAL_TABLET | Freq: Two times a day (BID) | ORAL | 0 refills | 0.00000 days | Status: CP
Start: 2017-12-31 — End: 2018-02-02

## 2018-01-05 ENCOUNTER — Ambulatory Visit: Admit: 2018-01-05 | Discharge: 2018-01-05 | Payer: MEDICAID | Attending: Family | Primary: Family

## 2018-01-05 DIAGNOSIS — R12 Heartburn: Secondary | ICD-10-CM

## 2018-01-05 DIAGNOSIS — E1165 Type 2 diabetes mellitus with hyperglycemia: Principal | ICD-10-CM

## 2018-01-05 DIAGNOSIS — Z79899 Other long term (current) drug therapy: Secondary | ICD-10-CM

## 2018-01-05 DIAGNOSIS — M10079 Idiopathic gout, unspecified ankle and foot: Secondary | ICD-10-CM

## 2018-01-05 DIAGNOSIS — K591 Functional diarrhea: Secondary | ICD-10-CM

## 2018-01-05 DIAGNOSIS — R197 Diarrhea, unspecified: Secondary | ICD-10-CM

## 2018-01-05 DIAGNOSIS — Z794 Long term (current) use of insulin: Secondary | ICD-10-CM

## 2018-01-05 MED ORDER — DIPHENOXYLATE-ATROPINE 2.5 MG-0.025 MG TABLET
ORAL_TABLET | Freq: Two times a day (BID) | ORAL | 11 refills | 0.00000 days | Status: CP | PRN
Start: 2018-01-05 — End: 2019-01-06

## 2018-01-05 MED ORDER — DICYCLOMINE 10 MG CAPSULE
ORAL_CAPSULE | Freq: Three times a day (TID) | ORAL | 5 refills | 0.00000 days | Status: CP
Start: 2018-01-05 — End: 2019-01-06

## 2018-01-05 MED ORDER — RANITIDINE 150 MG TABLET
ORAL_TABLET | Freq: Two times a day (BID) | ORAL | 3 refills | 0.00000 days | Status: CP
Start: 2018-01-05 — End: 2018-10-15

## 2018-01-08 ENCOUNTER — Encounter (INDEPENDENT_AMBULATORY_CARE_PROVIDER_SITE_OTHER): Payer: Self-pay | Admitting: Vascular Surgery

## 2018-01-08 ENCOUNTER — Ambulatory Visit (INDEPENDENT_AMBULATORY_CARE_PROVIDER_SITE_OTHER): Payer: Medicaid Other | Admitting: Vascular Surgery

## 2018-01-08 VITALS — BP 149/78 | HR 69

## 2018-01-08 DIAGNOSIS — I83811 Varicose veins of right lower extremities with pain: Secondary | ICD-10-CM

## 2018-01-08 DIAGNOSIS — I83813 Varicose veins of bilateral lower extremities with pain: Secondary | ICD-10-CM

## 2018-01-09 ENCOUNTER — Encounter (INDEPENDENT_AMBULATORY_CARE_PROVIDER_SITE_OTHER): Payer: Self-pay | Admitting: Vascular Surgery

## 2018-01-09 NOTE — Progress Notes (Signed)
    MRN : [id number]  Myer HaffRichard D Broadhead is a 37 y.o. ([birth year]) male who presents with chief complaint of  Chief Complaint  Patient presents with  . Follow-up    Right Leg Sclero  .   Procedure:  Sclerotherapy using hypertonic saline mixed with 1% Lidocaine was performed on the right  lower extremity.  Compression wraps were placed.  The patient tolerated the procedure well.  Plan:  Follow up as arranged

## 2018-01-15 ENCOUNTER — Ambulatory Visit: Payer: Medicaid Other | Admitting: Podiatry

## 2018-01-15 ENCOUNTER — Encounter: Payer: Self-pay | Admitting: Podiatry

## 2018-01-15 DIAGNOSIS — M79676 Pain in unspecified toe(s): Secondary | ICD-10-CM

## 2018-01-15 DIAGNOSIS — B351 Tinea unguium: Secondary | ICD-10-CM

## 2018-01-15 DIAGNOSIS — E1142 Type 2 diabetes mellitus with diabetic polyneuropathy: Secondary | ICD-10-CM

## 2018-01-15 NOTE — Progress Notes (Signed)
Complaint:  Visit Type: Patient returns to my office for continued preventative foot care services. Complaint: Patient states" my nails have grown long and thick and become painful to walk and wear shoes" Patient has been diagnosed with DM with no foot complications. The patient presents for preventative foot care services. No changes to ROS  Podiatric Exam: Vascular: dorsalis pedis and posterior tibial pulses are palpable bilateral. Capillary return is immediate. Temperature gradient is WNL. Skin turgor WNL  Sensorium: Normal Semmes Weinstein monofilament test. Normal tactile sensation bilaterally. Nail Exam: Pt has thick disfigured discolored nails with subungual debris noted bilateral entire nail hallux through fifth toenails Ulcer Exam: There is no evidence of ulcer or pre-ulcerative changes or infection. Orthopedic Exam: Muscle tone and strength are WNL. No limitations in general ROM. No crepitus or effusions noted. Foot type and digits show no abnormalities. Bony prominences are unremarkable. Skin: No Porokeratosis. No infection or ulcers  Diagnosis:  Onychomycosis, , Pain in right toe, pain in left toes  Treatment & Plan Procedures and Treatment: Consent by patient was obtained for treatment procedures.   Debridement of mycotic and hypertrophic toenails, 1 through 5 bilateral and clearing of subungual debris. No ulceration, no infection noted.  Return Visit-Office Procedure: Patient instructed to return to the office for a follow up visit 3 months for continued evaluation and treatment.    Duan Scharnhorst DPM 

## 2018-01-21 MED ORDER — CHLORHEXIDINE GLUCONATE 0.12 % MOUTHWASH
Freq: Two times a day (BID) | OROMUCOSAL | 2 refills | 0 days | Status: CP
Start: 2018-01-21 — End: 2018-03-26

## 2018-01-31 ENCOUNTER — Emergency Department
Admission: EM | Admit: 2018-01-31 | Discharge: 2018-01-31 | Disposition: A | Discharge status: Home / Self Care | Payer: No Typology Code available for payment source | Attending: Emergency Medicine | Admitting: Emergency Medicine

## 2018-01-31 ENCOUNTER — Encounter: Payer: Self-pay | Admitting: Emergency Medicine

## 2018-01-31 DIAGNOSIS — Z79899 Other long term (current) drug therapy: Secondary | ICD-10-CM | POA: Diagnosis not present

## 2018-01-31 DIAGNOSIS — R51 Headache: Secondary | ICD-10-CM | POA: Diagnosis present

## 2018-01-31 DIAGNOSIS — Z794 Long term (current) use of insulin: Secondary | ICD-10-CM | POA: Diagnosis not present

## 2018-01-31 DIAGNOSIS — M7918 Myalgia, other site: Secondary | ICD-10-CM | POA: Diagnosis not present

## 2018-01-31 DIAGNOSIS — J45909 Unspecified asthma, uncomplicated: Secondary | ICD-10-CM | POA: Diagnosis not present

## 2018-01-31 DIAGNOSIS — E119 Type 2 diabetes mellitus without complications: Secondary | ICD-10-CM | POA: Diagnosis not present

## 2018-01-31 DIAGNOSIS — Z7982 Long term (current) use of aspirin: Secondary | ICD-10-CM | POA: Insufficient documentation

## 2018-01-31 HISTORY — DX: Irritable bowel syndrome, unspecified: K58.9

## 2018-01-31 MED ORDER — IBUPROFEN 800 MG PO TABS: 800.0000 mg | ORAL_TABLET | Freq: Three times a day (TID) | ORAL | 0 refills | Status: DC | PRN

## 2018-01-31 MED ORDER — OXYCODONE-ACETAMINOPHEN 7.5-325 MG PO TABS: 1.0000 | ORAL_TABLET | Freq: Four times a day (QID) | ORAL | 0 refills | Status: DC | PRN

## 2018-01-31 MED ORDER — CYCLOBENZAPRINE HCL 10 MG PO TABS
10.0000 mg | ORAL_TABLET | Freq: Once | ORAL | Status: AC
  Administered 2018-01-31: 10 mg via ORAL
  Filled 2018-01-31: qty 1

## 2018-01-31 MED ORDER — IBUPROFEN 800 MG PO TABS
800.0000 mg | ORAL_TABLET | Freq: Once | ORAL | Status: AC
  Administered 2018-01-31: 800 mg via ORAL
  Filled 2018-01-31: qty 1

## 2018-01-31 MED ORDER — CYCLOBENZAPRINE HCL 10 MG PO TABS: 10.0000 mg | ORAL_TABLET | Freq: Three times a day (TID) | ORAL | 0 refills | Status: AC | PRN

## 2018-01-31 MED ORDER — OXYCODONE-ACETAMINOPHEN 5-325 MG PO TABS
1.0000 | ORAL_TABLET | Freq: Once | ORAL | Status: AC
  Administered 2018-01-31: 1 via ORAL
  Filled 2018-01-31: qty 1
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
## 2018-02-02 MED ORDER — NAPROXEN 500 MG TABLET
Freq: Two times a day (BID) | ORAL | 2 refills | 0.00000 days | Status: CP
Start: 2018-02-02 — End: 2019-01-06

## 2018-02-04 ENCOUNTER — Encounter: Payer: Self-pay | Admitting: Podiatry

## 2018-02-06 ENCOUNTER — Ambulatory Visit: Admit: 2018-02-06 | Discharge: 2018-02-07 | Payer: MEDICAID | Attending: Family | Primary: Family

## 2018-02-06 DIAGNOSIS — M5416 Radiculopathy, lumbar region: Secondary | ICD-10-CM

## 2018-02-06 MED ORDER — CYCLOBENZAPRINE 10 MG TABLET
ORAL_TABLET | Freq: Three times a day (TID) | ORAL | 1 refills | 0 days | Status: CP | PRN
Start: 2018-02-06 — End: 2019-01-06

## 2018-02-06 MED ORDER — IBUPROFEN 800 MG TABLET
ORAL_TABLET | Freq: Three times a day (TID) | ORAL | 1 refills | 0.00000 days | Status: CP | PRN
Start: 2018-02-06 — End: 2018-02-23

## 2018-02-06 MED ORDER — OXYCODONE-ACETAMINOPHEN 7.5 MG-325 MG TABLET
ORAL_TABLET | ORAL | 0 refills | 0 days | Status: CP | PRN
Start: 2018-02-06 — End: 2018-02-11

## 2018-02-11 MED ORDER — INSULIN GLARGINE (U-100) 100 UNIT/ML SUBCUTANEOUS SOLUTION
Freq: Every evening | SUBCUTANEOUS | 1 refills | 0 days | Status: CP
Start: 2018-02-11 — End: 2018-08-06

## 2018-02-11 MED ORDER — ONDANSETRON HCL 4 MG TABLET
Freq: Two times a day (BID) | ORAL | 2 refills | 0 days | Status: CP | PRN
Start: 2018-02-11 — End: 2019-01-06

## 2018-02-11 MED ORDER — PROMETHAZINE 25 MG TABLET
Freq: Every evening | ORAL | 2 refills | 0.00000 days | Status: CP | PRN
Start: 2018-02-11 — End: 2018-06-14

## 2018-02-11 MED ORDER — CETIRIZINE 10 MG TABLET
ORAL_TABLET | Freq: Every day | ORAL | 1 refills | 0.00000 days | Status: CP
Start: 2018-02-11 — End: 2019-01-06

## 2018-02-23 ENCOUNTER — Ambulatory Visit: Admit: 2018-02-23 | Discharge: 2018-02-24 | Payer: MEDICAID | Attending: Family | Primary: Family

## 2018-02-23 DIAGNOSIS — M5416 Radiculopathy, lumbar region: Secondary | ICD-10-CM

## 2018-02-23 MED ORDER — IBUPROFEN 800 MG TABLET
ORAL_TABLET | Freq: Three times a day (TID) | ORAL | 1 refills | 0.00000 days | Status: CP | PRN
Start: 2018-02-23 — End: 2018-02-23

## 2018-02-23 MED ORDER — IBUPROFEN 800 MG TABLET: 800 mg | tablet | Freq: Three times a day (TID) | 1 refills | 0 days | Status: AC

## 2018-02-24 ENCOUNTER — Ambulatory Visit: Admit: 2018-02-24 | Discharge: 2018-02-25 | Payer: MEDICAID

## 2018-02-24 DIAGNOSIS — M5416 Radiculopathy, lumbar region: Secondary | ICD-10-CM

## 2018-02-27 MED ORDER — HYDROCORTISONE ACETATE 25 MG RECTAL SUPPOSITORY
Freq: Two times a day (BID) | RECTAL | 1 refills | 0 days | Status: CP
Start: 2018-02-27 — End: 2018-03-26

## 2018-03-04 ENCOUNTER — Ambulatory Visit (INDEPENDENT_AMBULATORY_CARE_PROVIDER_SITE_OTHER): Payer: Medicaid Other | Admitting: Podiatry

## 2018-03-04 ENCOUNTER — Encounter

## 2018-03-04 ENCOUNTER — Ambulatory Visit: Payer: Medicaid Other | Admitting: Podiatry

## 2018-03-04 ENCOUNTER — Encounter: Payer: Self-pay | Admitting: Podiatry

## 2018-03-04 DIAGNOSIS — M722 Plantar fascial fibromatosis: Secondary | ICD-10-CM | POA: Diagnosis not present

## 2018-03-04 NOTE — Progress Notes (Signed)
He presents today with his son stated that his plantar fasciitis is starting to hurt again.  Objective: He has pain on palpation medial calcaneal tubercles bilateral.  Assessment: Pain in limb secondary to plantar fasciitis.  Plan: Injected the bilateral heel today after sterile Betadine skin prep 20 mg Kenalog 5 mg Marcaine bilaterally.

## 2018-03-11 MED ORDER — DICLOFENAC SODIUM 50 MG TABLET,DELAYED RELEASE
Freq: Two times a day (BID) | ORAL | 2 refills | 0 days | Status: CP | PRN
Start: 2018-03-11 — End: 2018-06-14

## 2018-03-16 ENCOUNTER — Ambulatory Visit: Payer: Medicaid Other | Attending: Family | Admitting: Physical Therapy

## 2018-03-16 DIAGNOSIS — M545 Low back pain: Secondary | ICD-10-CM | POA: Insufficient documentation

## 2018-03-16 DIAGNOSIS — R2689 Other abnormalities of gait and mobility: Secondary | ICD-10-CM | POA: Insufficient documentation

## 2018-03-16 DIAGNOSIS — M6281 Muscle weakness (generalized): Secondary | ICD-10-CM | POA: Insufficient documentation

## 2018-03-23 ENCOUNTER — Encounter: Payer: Self-pay | Admitting: Physical Therapy

## 2018-03-23 ENCOUNTER — Ambulatory Visit: Payer: Medicaid Other | Admitting: Physical Therapy

## 2018-03-23 ENCOUNTER — Other Ambulatory Visit: Payer: Self-pay

## 2018-03-23 DIAGNOSIS — M545 Low back pain, unspecified: Secondary | ICD-10-CM

## 2018-03-23 DIAGNOSIS — M6281 Muscle weakness (generalized): Secondary | ICD-10-CM | POA: Diagnosis present

## 2018-03-23 DIAGNOSIS — R2689 Other abnormalities of gait and mobility: Secondary | ICD-10-CM
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
## 2018-03-24 ENCOUNTER — Telehealth: Payer: Self-pay

## 2018-03-24 ENCOUNTER — Encounter: Payer: Self-pay | Admitting: Podiatry

## 2018-03-24 DIAGNOSIS — M79672 Pain in left foot: Secondary | ICD-10-CM

## 2018-03-24 DIAGNOSIS — M722 Plantar fascial fibromatosis: Secondary | ICD-10-CM

## 2018-03-24 NOTE — Addendum Note (Signed)
Addended by: Dorene Grebe C on: 03/24/2018 09:07 AM   Modules accepted: Orders

## 2018-03-24 NOTE — Telephone Encounter (Signed)
I spoke with patient, he told me that the last injection helped for 2-3 days and then the pain came back. He says its worse in the mornings and when he walks a lot.  He is currently taking Motrin which is not helping.  Can we send him in a dose pack and Meloxicam?   Please advise

## 2018-03-24 NOTE — Telephone Encounter (Signed)
Opened in error

## 2018-03-26 ENCOUNTER — Encounter: Payer: Self-pay | Admitting: Podiatry

## 2018-03-26 ENCOUNTER — Ambulatory Visit
Admit: 2018-03-26 | Discharge: 2018-03-27 | Payer: MEDICAID | Attending: Physician Assistant | Primary: Physician Assistant

## 2018-03-26 DIAGNOSIS — G4761 Periodic limb movement disorder: Principal | ICD-10-CM

## 2018-03-26 DIAGNOSIS — G4733 Obstructive sleep apnea (adult) (pediatric): Secondary | ICD-10-CM

## 2018-03-26 DIAGNOSIS — Z9989 Dependence on other enabling machines and devices: Secondary | ICD-10-CM

## 2018-03-26 MED ORDER — HYDROCORTISONE ACETATE 25 MG RECTAL SUPPOSITORY
Freq: Two times a day (BID) | RECTAL | 1 refills | 0 days | Status: CP
Start: 2018-03-26 — End: 2019-03-27

## 2018-03-26 MED ORDER — CHLORHEXIDINE GLUCONATE 0.12 % MOUTHWASH
Freq: Two times a day (BID) | OROMUCOSAL | 2 refills | 0 days | Status: CP
Start: 2018-03-26 — End: 2019-01-06

## 2018-03-27 ENCOUNTER — Encounter: Payer: Self-pay | Admitting: Podiatry

## 2018-03-30 ENCOUNTER — Ambulatory Visit (INDEPENDENT_AMBULATORY_CARE_PROVIDER_SITE_OTHER): Payer: Medicaid Other

## 2018-03-30 ENCOUNTER — Ambulatory Visit (INDEPENDENT_AMBULATORY_CARE_PROVIDER_SITE_OTHER): Payer: No Typology Code available for payment source | Admitting: Podiatry

## 2018-03-30 ENCOUNTER — Encounter: Payer: Self-pay | Admitting: Podiatry

## 2018-03-30 DIAGNOSIS — E1142 Type 2 diabetes mellitus with diabetic polyneuropathy: Secondary | ICD-10-CM

## 2018-03-30 DIAGNOSIS — M722 Plantar fascial fibromatosis: Secondary | ICD-10-CM

## 2018-03-30 NOTE — Progress Notes (Signed)
Patient presents today for xray of right heel.  This is needed for prior authorization for MRI right heel.  Xray reveal no evidence of calcification of plantar fascia left foot.    Plantar fascitis  Xray taken.  RTC prn.   Helane Gunther DPM

## 2018-03-31 ENCOUNTER — Ambulatory Visit: Payer: Medicaid Other | Admitting: Physical Therapy

## 2018-03-31 DIAGNOSIS — M545 Low back pain, unspecified: Secondary | ICD-10-CM

## 2018-03-31 DIAGNOSIS — M6281 Muscle weakness (generalized): Secondary | ICD-10-CM

## 2018-03-31 DIAGNOSIS — R2689 Other abnormalities of gait and mobility: Secondary | ICD-10-CM

## 2018-04-01 ENCOUNTER — Encounter: Payer: Self-pay | Admitting: Physical Therapy

## 2018-04-01 ENCOUNTER — Other Ambulatory Visit: Admit: 2018-04-01 | Discharge: 2018-04-02 | Payer: MEDICAID

## 2018-04-01 DIAGNOSIS — Z794 Long term (current) use of insulin: Secondary | ICD-10-CM

## 2018-04-01 DIAGNOSIS — E1165 Type 2 diabetes mellitus with hyperglycemia: Principal | ICD-10-CM

## 2018-04-01 DIAGNOSIS — R5383 Other fatigue: Secondary | ICD-10-CM

## 2018-04-01 NOTE — Therapy (Signed)
[facility] [address] [place], [place], [address] Phone: [phone]   Fax:  [phone]  Physical Therapy Treatment  Patient Details  Name: NURI LARMER MRN: [id number] Date of Birth: 12/27/1980 Referring Provider (PT): Etheleen Nicks, FNP   Encounter Date: 03/31/2018  PT End of Session - 04/01/18 1223    Visit Number  2    Number of Visits  8    Date for PT Re-Evaluation  05/18/18    PT Start Time  0855    PT Stop Time  0949    PT Time Calculation (min)  54 min    Activity Tolerance  Patient tolerated treatment well;Patient limited by pain    Behavior During Therapy  [facility] for tasks assessed/performed       Past Medical History:  Diagnosis Date  . Asthma   . Depressed   . Diabetes mellitus without complication (HCC)   . IBS (irritable bowel syndrome)   . Morbid obesity (HCC)     Past Surgical History:  Procedure Laterality Date  . EYE SURGERY      There were no vitals filed for this visit.  Subjective Assessment - 04/01/18 1211    Subjective  Pt. entered PT with c/o persistent low back pain.  Pt. reports 7/10 low back midline pain at this time.  Pt. reports difficulty with prolonged standing/ walking.      Limitations  Sitting;Lifting;Standing;Walking;House hold activities    How long can you sit comfortably?  30-45 min    How long can you stand comfortably?  2-3 min    How long can you walk comfortably?  30 min    Diagnostic tests  imaging performed previously by MD.    Patient Stated Goals  Become pain-free    Currently in Pain?  Yes    Pain Score  7     Pain Location  Back    Pain Orientation  Lower    Pain Descriptors / Indicators  Aching        Treatment:  MH to low back in seated posture prior to tx. Session- "feels good".  There.ex.:  Reviewed HEP  Manual tx.:    Supine hamstring/ piriformis/ trunk rotn./ gastroc stretches (18 min.).  Generalized stretches as tolerated by pt.   Supine L/R LE neural glides 3x20 sec. Each.  Moderate hamstring tightness. L/R sidelying STM to lumbar paraspinals/hips (tenderness noted).   Discussed use of MH and ice.   Instruction in standing posture/ gait correction with use of mirror feedback.    PT Short Term Goals - 03/23/18 1914      PT SHORT TERM GOAL #1   Title  Pt will be independent with HEP in order to improve strength and decrease back pain in order to improve pain-free function at home and work.     Baseline  1    Period  Weeks    Status  New    Target Date  03/30/18        PT Long Term Goals - 03/23/18 1914      PT LONG TERM GOAL #1   Title  Pt. will complete FOTO and improve to 49 to improve daily functional mobility.    Baseline  FOTO 28 on 03/23/18    Time  8    Period  Weeks    Status  New    Target Date  05/18/18      PT LONG TERM GOAL #2   Title  Pt will decrease worst back pain as reported on NPRS by at least 2 points in order to demonstrate clinically significant reduction in back pain.     Baseline  03/23/18 Worst Pain: 9/10    Time  8    Period  Weeks    Status  New    Target Date  05/18/18      PT LONG TERM GOAL #3   Title  Pt will increase strength of B Hip by at least 1/2 MMT grade in order to demonstrate improvement in strength and function.    Baseline  Hip Flex, abd, add, R/L: 4/4 with pain    Time  8    Period  Weeks    Status  New    Target Date  05/18/18      PT LONG TERM GOAL #4   Title  Pt will decrease 5TSTS by at least 3 seconds in order to demonstrate clinically significant improvement in LE strength    Baseline  03/23/18 TUG: 44.15 sec    Time  8    Period  Weeks    Status  New    Target Date  05/18/18         Plan - 04/01/18 1227    Clinical Impression Statement  Pt. pain limited with all aspects of movement in PT clinic with tenderness noted over lumbar spine.  No increase c/o pain during thoracic/lumbar rotation stretches on blue mat table with overpressure.   Difficult to assess tenderness in lumbar region due to obesity/ sidelying position.  No prone positioning today.  PT encouraged pt. to continue with HEP on a consistent basis at this time.      Clinical Presentation  Evolving    Clinical Decision Making  Moderate    Rehab Potential  Fair    PT Frequency  1x / week    PT Duration  8 weeks    PT Treatment/Interventions  ADLs/Self Care Home Management;Aquatic Therapy;Canalith Repostioning;Electrical Stimulation;Moist Heat;Iontophoresis 4mg /ml Dexamethasone;Traction;Gait training;Stair training;Functional mobility training;Therapeutic activities;Therapeutic exercise;Balance training;Neuromuscular re-education;Patient/family education;Orthotic Fit/Training;Manual techniques;Taping;Energy conservation;Dry needling;Passive range of motion;Spinal Manipulations;Joint Manipulations    PT Next Visit Plan  Progress HEP/ attempt prone low back assessment    PT Home Exercise Plan  see notes    Consulted and Agree with Plan of Care  Patient       Patient will benefit from skilled therapeutic intervention in order to improve the following deficits and impairments:  Abnormal gait, Decreased endurance, Hypomobility, Obesity, Decreased activity tolerance, Decreased strength, Pain, Decreased mobility, Difficulty walking, Improper body mechanics, Decreased range of motion, Impaired flexibility, Postural dysfunction  Visit Diagnosis: Lumbar pain  Muscle weakness (generalized)  Antalgic gait     Problem List Patient Active Problem List   Diagnosis Date Noted  . Varicose veins of both lower extremities with pain 10/16/2017  . IBS (irritable bowel syndrome) 06/18/2017  . Chronic venous insufficiency 12/31/2016  . Lymphedema 12/31/2016  . Leg pain 12/31/2016  . Diabetes (HCC) 12/31/2016  . GERD (gastroesophageal reflux disease) 12/31/2016  . 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 H/O retinal detachment 10/01/2016  . No diabetic  retinopathy in both eyes 10/01/2016  . Cellulitis of right lower extremity 06/19/2016  . Chronic joint pain 11/16/2015  . Skin infection 05/15/2015  . Depressed   . Tick bite 10/20/2014  . Primary insomnia 07/08/2014  . Candidal balanitis 02/16/2014  . Skin tag 01/17/2014  . Left shoulder pain 10/04/2013  . Anxiety 09/13/2013  .  Diarrhea 09/13/2013  . Bleeding external hemorrhoids 09/03/2013  . NAFLD (nonalcoholic fatty liver disease) 82/95/6213  . Headache 07/05/2013  . Abdominal pain 03/05/2013  . Morbid (severe) obesity due to excess calories (HCC) 03/05/2013  . Suicidal ideation 02/09/2013  . Obstructive sleep apnea 07/21/2012  . 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   Cammie Mcgee, PT, DPT # [phone] 04/01/2018, 1:39 PM  [facility] [address] [place], [place], [address] Phone: [phone]   Fax:  [phone]  Name: FINLAY GODBEE MRN: [id number] Date of Birth: 1980-10-22

## 2018-04-07 ENCOUNTER — Encounter: Payer: Medicaid Other | Admitting: Physical Therapy

## 2018-04-07 ENCOUNTER — Ambulatory Visit: Admit: 2018-04-07 | Discharge: 2018-04-08 | Payer: MEDICAID | Attending: Family | Primary: Family

## 2018-04-07 DIAGNOSIS — Z794 Long term (current) use of insulin: Secondary | ICD-10-CM

## 2018-04-07 DIAGNOSIS — K219 Gastro-esophageal reflux disease without esophagitis: Secondary | ICD-10-CM

## 2018-04-07 DIAGNOSIS — M5416 Radiculopathy, lumbar region: Secondary | ICD-10-CM

## 2018-04-07 DIAGNOSIS — Z6841 Body Mass Index (BMI) 40.0 and over, adult: Secondary | ICD-10-CM

## 2018-04-07 DIAGNOSIS — R7989 Other specified abnormal findings of blood chemistry: Secondary | ICD-10-CM

## 2018-04-07 DIAGNOSIS — E1165 Type 2 diabetes mellitus with hyperglycemia: Principal | ICD-10-CM

## 2018-04-07 MED ORDER — NYSTATIN 100,000 UNIT/GRAM TOPICAL POWDER
3 refills | 0 days | Status: CP
Start: 2018-04-07 — End: 2018-10-21

## 2018-04-09 ENCOUNTER — Ambulatory Visit: Payer: Medicaid Other | Admitting: Physical Therapy

## 2018-04-09 ENCOUNTER — Encounter: Payer: Self-pay | Admitting: Physical Therapy

## 2018-04-09 DIAGNOSIS — M545 Low back pain, unspecified: Secondary | ICD-10-CM

## 2018-04-09 DIAGNOSIS — M6281 Muscle weakness (generalized): Secondary | ICD-10-CM

## 2018-04-09 DIAGNOSIS — R2689 Other abnormalities of gait and mobility: Secondary | ICD-10-CM

## 2018-04-09 NOTE — Patient Instructions (Addendum)
Access Code: [email or web address]  URL: [email or web address]  Date: 04/09/2018  Prepared by: Sheria Lang   Exercises  Supine Posterior Pelvic Tilt - 10 reps - 10 hold - 1x daily - 7x weekly  Single Knee to Chest Stretch - 2 sets - 30 hold - 2x daily - 7x weekly  Supine Lower Trunk Rotation - 4 sets - 30 hold - 1x daily - 7x weekly  Hooklying Hamstring Stretch with Strap - 3 reps - 30 hold - 1x daily - 7x weekly  Hooklying Lumbar Rotation - 10 reps - 10 hold - 1x daily - 7x weekly

## 2018-04-09 NOTE — Therapy (Signed)
[facility] [address] [place], [place], [address] Phone: [phone]   Fax:  [phone]  Physical Therapy Treatment  Patient Details  Name: Jason Davidson MRN: [id number] Date of Birth: 07/08/1980 Referring Provider (PT): Etheleen Nicks, FNP   Encounter Date: 04/09/2018  PT End of Session - 04/09/18 1157    Visit Number  3    Number of Visits  8    Date for PT Re-Evaluation  05/18/18    PT Start Time  1106    PT Stop Time  1150    PT Time Calculation (min)  44 min    Activity Tolerance  Patient tolerated treatment well;Patient limited by pain    Behavior During Therapy  [facility] for tasks assessed/performed       Past Medical History:  Diagnosis Date  . Asthma   . Depressed   . Diabetes mellitus without complication (HCC)   . IBS (irritable bowel syndrome)   . Morbid obesity (HCC)     Past Surgical History:  Procedure Laterality Date  . EYE SURGERY      There were no vitals filed for this visit.  Subjective Assessment - 04/09/18 1155    Subjective  Pt presents to clinic with low back pain that has been exacerabated by walking for trick or treat and other halloween events. He states that he continues to rely on ice/heat and flexerall for the pain, but doesn't feel they are that useful at this point.    Limitations  Sitting;Lifting;Standing;Walking;House hold activities    How long can you sit comfortably?  30-45 min    How long can you stand comfortably?  2-3 min    How long can you walk comfortably?  30 min    Diagnostic tests  imaging performed previously by MD.    Patient Stated Goals  Become pain-free    Currently in Pain?  Yes    Pain Score  8     Pain Location  Back    Pain Orientation  Lower    Pain Descriptors / Indicators  Aching    Pain Type  Chronic pain    Pain Onset  More than a month ago    Pain Frequency  Constant       TREATMENT  Therapeutic Exercise: B Hamstring stretch w/ strap, 3x30  sec (hooklying) Posterior pelvic tilts 10x10 sec hold Hooklying trunk rotations 4x30 sec each side Hooklying TrA activation 10x10 sec hold Hooklying TrA activation w/ lateral pelvic tilt x10  Patient educated on the importance of movement as a component of treating his LBP, body mechanics, and parameters for continuing movement in the presence of pain. Patient reported a pain rating of 7/10 at the end of the session. Patient needs new HEP access code at next visit (see patient instructions).  Treatments unbilled: MHP in supine during therapeutic exercises    PT Short Term Goals - 03/23/18 1914      PT SHORT TERM GOAL #1   Title  Pt will be independent with HEP in order to improve strength and decrease back pain in order to improve pain-free function at home and work.     Baseline  1    Period  Weeks    Status  New    Target Date  03/30/18        PT Long Term Goals - 03/23/18 1914      PT LONG TERM GOAL #1   Title  Pt. will complete  FOTO and improve to 49 to improve daily functional mobility.    Baseline  FOTO 28 on 03/23/18    Time  8    Period  Weeks    Status  New    Target Date  05/18/18      PT LONG TERM GOAL #2   Title  Pt will decrease worst back pain as reported on NPRS by at least 2 points in order to demonstrate clinically significant reduction in back pain.     Baseline  03/23/18 Worst Pain: 9/10    Time  8    Period  Weeks    Status  New    Target Date  05/18/18      PT LONG TERM GOAL #3   Title  Pt will increase strength of B Hip by at least 1/2 MMT grade in order to demonstrate improvement in strength and function.    Baseline  Hip Flex, abd, add, R/L: 4/4 with pain    Time  8    Period  Weeks    Status  New    Target Date  05/18/18      PT LONG TERM GOAL #4   Title  Pt will decrease 5TSTS by at least 3 seconds in order to demonstrate clinically significant improvement in LE strength    Baseline  03/23/18 TUG: 44.15 sec    Time  8    Period  Weeks     Status  New    Target Date  05/18/18            Plan - 04/09/18 1159    Clinical Impression Statement Patient demonstrates deficits in range of motion, pain, and activity tolerance as evidenced by his resting pain of 8/10, his inability to stretch his R hamstring above 45 degrees, and his need for frequent rest breaks. Patient will benefit from continued skilled therapeutic intervention to address deficits in range of motion, pain, and activity tolerance  in order to return to PLOF and improve overall QOL.    Rehab Potential  Fair    PT Frequency  1x / week    PT Duration  8 weeks    PT Treatment/Interventions  ADLs/Self Care Home Management;Aquatic Therapy;Canalith Repostioning;Electrical Stimulation;Moist Heat;Iontophoresis 4mg /ml Dexamethasone;Traction;Gait training;Stair training;Functional mobility training;Therapeutic activities;Therapeutic exercise;Balance training;Neuromuscular re-education;Patient/family education;Orthotic Fit/Training;Manual techniques;Taping;Energy conservation;Dry needling;Passive range of motion;Spinal Manipulations;Joint Manipulations    PT Next Visit Plan  Progress HEP/ attempt prone low back assessment    PT Home Exercise Plan  see notes    Consulted and Agree with Plan of Care  Patient       Patient will benefit from skilled therapeutic intervention in order to improve the following deficits and impairments:  Abnormal gait, Decreased endurance, Hypomobility, Obesity, Decreased activity tolerance, Decreased strength, Pain, Decreased mobility, Difficulty walking, Improper body mechanics, Decreased range of motion, Impaired flexibility, Postural dysfunction  Visit Diagnosis: Lumbar pain  Muscle weakness (generalized)  Antalgic gait     Problem List Patient Active Problem List   Diagnosis Date Noted  . Varicose veins of both lower extremities with pain 10/16/2017  . IBS (irritable bowel syndrome) 06/18/2017  . Chronic venous insufficiency  12/31/2016  . Lymphedema 12/31/2016  . Leg pain 12/31/2016  . Diabetes (HCC) 12/31/2016  . GERD (gastroesophageal reflux disease) 12/31/2016  . 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 H/O retinal detachment 10/01/2016  . No diabetic retinopathy in both eyes 10/01/2016  . Cellulitis of right lower extremity 06/19/2016  . Chronic  joint pain 11/16/2015  . Skin infection 05/15/2015  . Depressed   . Tick bite 10/20/2014  . Primary insomnia 07/08/2014  . Candidal balanitis 02/16/2014  . Skin tag 01/17/2014  . Left shoulder pain 10/04/2013  . Anxiety 09/13/2013  . Diarrhea 09/13/2013  . Bleeding external hemorrhoids 09/03/2013  . NAFLD (nonalcoholic fatty liver disease) 11/91/4782  . Headache 07/05/2013  . Abdominal pain 03/05/2013  . Morbid (severe) obesity due to excess calories (HCC) 03/05/2013  . Suicidal ideation 02/09/2013  . Obstructive sleep apnea 07/21/2012  . 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   Sheria Lang PT, DPT [phone] 04/09/2018, 12:31 PM  [facility] [address] [place], [place], [address] Phone: [phone]   Fax:  [phone]  Name: Jason Davidson MRN: [id number] Date of Birth: January 01, 1981

## 2018-04-10 MED ORDER — ALBUTEROL SULFATE HFA 90 MCG/ACTUATION AEROSOL INHALER
RESPIRATORY_TRACT | 1 refills | 0 days | Status: CP | PRN
Start: 2018-04-10 — End: 2018-06-18

## 2018-04-11 ENCOUNTER — Encounter: Payer: Self-pay | Admitting: Podiatry

## 2018-04-13 NOTE — Telephone Encounter (Signed)
I informed pt, the doctor-on-call had ordered tramadol. Pt states tramadol is like taking candy, he's on prescription strength naprosyn and it is not helping. I offered pt an appt and he states he can't get an appt until he had the MRI. I told pt I would send his request to Dr. Al Corpus.

## 2018-04-14 ENCOUNTER — Encounter: Payer: Self-pay | Admitting: Physical Therapy

## 2018-04-14 ENCOUNTER — Ambulatory Visit: Payer: Medicaid Other | Attending: Family | Admitting: Physical Therapy

## 2018-04-14 DIAGNOSIS — M6281 Muscle weakness (generalized): Secondary | ICD-10-CM

## 2018-04-14 DIAGNOSIS — M545 Low back pain, unspecified: Secondary | ICD-10-CM

## 2018-04-14 DIAGNOSIS — R2689 Other abnormalities of gait and mobility: Secondary | ICD-10-CM | POA: Diagnosis present

## 2018-04-14 MED ORDER — PROPRANOLOL ER 120 MG CAPSULE,24 HR,EXTENDED RELEASE
ORAL_CAPSULE | Freq: Every day | ORAL | 1 refills | 0.00000 days | Status: CP
Start: 2018-04-14 — End: 2018-10-20

## 2018-04-14 MED ORDER — TOPIRAMATE 50 MG TABLET
ORAL_TABLET | Freq: Two times a day (BID) | ORAL | 1 refills | 0.00000 days | Status: CP
Start: 2018-04-14 — End: 2019-01-06

## 2018-04-14 MED ORDER — INSULIN SYRINGE U-100 WITH NEEDLE 1 ML 30 GAUGE X 1/2" (12 MM)
1 refills | 0 days | Status: CP
Start: 2018-04-14 — End: 2018-11-15

## 2018-04-14 NOTE — Therapy (Signed)
[facility] [address] [place], [place], [address] Phone: [phone]   Fax:  [phone]  Physical Therapy Treatment  Patient Details  Name: Jason Davidson MRN: [id number] Date of Birth: Jun 19, 1980 Referring Provider (PT): Verita Lamb, FNP   Encounter Date: 04/14/2018  PT End of Session - 04/14/18 0901    Visit Number  4    Number of Visits  8    Date for PT Re-Evaluation  05/18/18    PT Start Time  0856    PT Stop Time  0952    PT Time Calculation (min)  56 min    Activity Tolerance  Patient tolerated treatment well;Patient limited by pain    Behavior During Therapy  [facility] for tasks assessed/performed       Past Medical History:  Diagnosis Date  . Asthma   . Depressed   . Diabetes mellitus without complication ([place])   . IBS (irritable bowel syndrome)   . Morbid obesity ([place])     Past Surgical History:  Procedure Laterality Date  . EYE SURGERY      There were no vitals filed for this visit.  Subjective Assessment - 04/14/18 0858    Subjective  Pt. notes that with exercises from last treatment, his pain has become more aggravated.  Pt. notes he was out of town over the weekend, which also increased pain as well.  Pt. notes 8/10 pain upon walking into the clinic.    Limitations  Sitting;Lifting;Standing;Walking;House hold activities    How long can you sit comfortably?  30-45 min    How long can you stand comfortably?  2-3 min    How long can you walk comfortably?  30 min    Diagnostic tests  imaging performed previously by MD.    Patient Stated Goals  Become pain-free    Currently in Pain?  Yes    Pain Score  8     Pain Location  Back    Pain Orientation  Lower    Pain Descriptors / Indicators  Aching    Pain Onset  More than a month ago           TREATMENT   Therapeutic Exercise:  Seated Marches 2x15 each leg Seated LAQ 2x15 each leg  B Hamstring stretch w/ strap, 3x30 sec (hooklying)  L  hamstring length is greater than R LE.  Pt. Notes this is the hardest portion of the exercises.  Posterior pelvic tilts 10x10 sec hold Hooklying trunk rotations 5x30 sec each side Hooklying TrA activation 10x10 sec hold Hooklying TrA activation w/ lateral pelvic tilt x10     Treatments unbilled:  MHP in supine during therapeutic exercises    Goal Reassessment performed and noted below.     PT Short Term Goals - 03/23/18 1914      PT SHORT TERM GOAL #1   Title  Pt will be independent with HEP in order to improve strength and decrease back pain in order to improve pain-free function at home and work.     Baseline  1    Period  Weeks    Status  New    Target Date  03/30/18        PT Long Term Goals - 04/14/18 0935      PT LONG TERM GOAL #1   Title  Pt. will complete FOTO and improve to 49 to improve daily functional mobility.    Baseline  FOTO 28 on 03/23/18; FOTO  31 on 04/14/18    Time  8    Period  Weeks    Status  Partially Met    Target Date  05/18/18      PT LONG TERM GOAL #2   Title  Pt will decrease worst back pain as reported on NPRS by at least 2 points in order to demonstrate clinically significant reduction in back pain.     Baseline  03/23/18 Worst Pain: 9/10; 04/14/18 Worst Pain: 8/10    Time  8    Period  Weeks    Status  Partially Met    Target Date  05/18/18      PT LONG TERM GOAL #3   Title  Pt will increase strength of B Hip by at least 1/2 MMT grade in order to demonstrate improvement in strength and function.    Baseline  Hip Flex, abd, add, R/L: 4/4 with pain    Time  8    Period  Weeks    Status  Partially Met    Target Date  05/18/18      PT LONG TERM GOAL #4   Title  Pt will decrease 5TSTS by at least 3 seconds in order to demonstrate clinically significant improvement in LE strength    Baseline  03/23/18 5TSTS: 44.15 sec; 04/14/18 5TSTS: 47.38 sec.    Time  8    Period  Weeks    Status  Partially Met    Target Date  05/18/18           Plan - 04/14/18 0917    Clinical Impression Statement  Pt. continues to require frequent rest breaks during treatment session and has difficulty breathing while in supine position.  Pt. notes that his pain has not been improving and is more aggravated by performing exercises.  Pt. has show improvement towards 2 of the 4 goals set forth at initial evaluation and will continue to benefit from skilled therapy to improve pain and increase QoL.      Clinical Presentation  Evolving    Clinical Decision Making  Moderate    Rehab Potential  Fair    PT Frequency  1x / week    PT Duration  8 weeks    PT Treatment/Interventions  ADLs/Self Care Home Management;Aquatic Therapy;Canalith Repostioning;Electrical Stimulation;Moist Heat;Iontophoresis '4mg'$ /ml Dexamethasone;Traction;Gait training;Stair training;Functional mobility training;Therapeutic activities;Therapeutic exercise;Balance training;Neuromuscular re-education;Patient/family education;Orthotic Fit/Training;Manual techniques;Taping;Energy conservation;Dry needling;Passive range of motion;Spinal Manipulations;Joint Manipulations    PT Next Visit Plan  Progress HEP/ attempt prone low back assessment    PT Home Exercise Plan  see notes    Consulted and Agree with Plan of Care  Patient       Patient will benefit from skilled therapeutic intervention in order to improve the following deficits and impairments:  Abnormal gait, Decreased endurance, Hypomobility, Obesity, Decreased activity tolerance, Decreased strength, Pain, Decreased mobility, Difficulty walking, Improper body mechanics, Decreased range of motion, Impaired flexibility, Postural dysfunction  Visit Diagnosis: Lumbar pain  Muscle weakness (generalized)  Antalgic gait     Problem List Patient Active Problem List   Diagnosis Date Noted  . Varicose veins of both lower extremities with pain 10/16/2017  . IBS (irritable bowel syndrome) 06/18/2017  . Chronic venous  insufficiency 12/31/2016  . Lymphedema 12/31/2016  . Leg pain 12/31/2016  . Diabetes ([place]) 12/31/2016  . GERD (gastroesophageal reflux disease) 12/31/2016  . 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 H/O retinal detachment 10/01/2016  . No diabetic retinopathy in both eyes 10/01/2016  .  Cellulitis of right lower extremity 06/19/2016  . Chronic joint pain 11/16/2015  . Skin infection 05/15/2015  . Depressed   . Tick bite 10/20/2014  . Primary insomnia 07/08/2014  . Candidal balanitis 02/16/2014  . Skin tag 01/17/2014  . Left shoulder pain 10/04/2013  . Anxiety 09/13/2013  . Diarrhea 09/13/2013  . Bleeding external hemorrhoids 09/03/2013  . NAFLD (nonalcoholic fatty liver disease) 08/12/2013  . Headache 07/05/2013  . Abdominal pain 03/05/2013  . Morbid (severe) obesity due to excess calories ([place]) 03/05/2013  . Suicidal ideation 02/09/2013  . Obstructive sleep apnea 07/21/2012  . 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   Pura Spice, PT, DPT # [id number] Gwenlyn Saran, SPT 04/14/2018, 6:01 PM  [place] [facility] [address] [place], [place], [address] Phone: [phone]   Fax:  [phone]  Name: IZEKIEL FLEGEL MRN: [id number] Date of Birth: 1981-02-20

## 2018-04-15 MED ORDER — HYDROCODONE-ACETAMINOPHEN 5-325 MG PO TABS: 1.0000 | ORAL_TABLET | Freq: Four times a day (QID) | ORAL | 0 refills | Status: AC | PRN

## 2018-04-15 NOTE — Telephone Encounter (Signed)
Script for Hydrocodone has been printed and left at front desk for patient pick up.  Patient has been informed of prescription.

## 2018-04-15 NOTE — Addendum Note (Signed)
Addended by: Geraldine Contras D on: 04/15/2018 05:17 PM   Modules accepted: Orders

## 2018-04-17 MED ORDER — HYDROCODONE 5 MG-ACETAMINOPHEN 325 MG TABLET
ORAL_TABLET | Freq: Four times a day (QID) | ORAL | 0 refills | 0 days | Status: CP | PRN
Start: 2018-04-17 — End: 2018-04-22

## 2018-04-20 ENCOUNTER — Ambulatory Visit
Admission: RE | Admit: 2018-04-20 | Discharge: 2018-04-20 | Disposition: A | Discharge status: Home / Self Care | Payer: Medicaid Other | Source: Ambulatory Visit | Attending: Podiatry | Admitting: Podiatry

## 2018-04-20 DIAGNOSIS — M79672 Pain in left foot: Secondary | ICD-10-CM | POA: Diagnosis not present

## 2018-04-20 DIAGNOSIS — M67971 Unspecified disorder of synovium and tendon, right ankle and foot: Secondary | ICD-10-CM | POA: Insufficient documentation

## 2018-04-20 DIAGNOSIS — M722 Plantar fascial fibromatosis: Secondary | ICD-10-CM | POA: Diagnosis not present

## 2018-04-20 DIAGNOSIS — M67972 Unspecified disorder of synovium and tendon, left ankle and foot: Secondary | ICD-10-CM | POA: Diagnosis not present

## 2018-04-20 MED ORDER — INSULIN U-100 REGULAR HUMAN 100 UNIT/ML INJECTION SOLUTION
1 refills | 0 days | Status: CP
Start: 2018-04-20 — End: 2018-05-04

## 2018-04-20 MED ORDER — FLUTICASONE 500 MCG-SALMETEROL 50 MCG/DOSE BLISTR POWDR FOR INHALATION
Freq: Two times a day (BID) | RESPIRATORY_TRACT | 1 refills | 0.00000 days | Status: CP
Start: 2018-04-20 — End: 2018-06-17

## 2018-04-22 ENCOUNTER — Telehealth: Payer: Self-pay | Admitting: *Deleted

## 2018-04-22 ENCOUNTER — Encounter: Admit: 2018-04-22 | Discharge: 2018-04-23 | Payer: MEDICAID

## 2018-04-22 ENCOUNTER — Ambulatory Visit: Admit: 2018-04-22 | Discharge: 2018-04-23 | Payer: MEDICAID

## 2018-04-22 DIAGNOSIS — N3944 Nocturnal enuresis: Secondary | ICD-10-CM

## 2018-04-22 DIAGNOSIS — Z125 Encounter for screening for malignant neoplasm of prostate: Secondary | ICD-10-CM

## 2018-04-22 DIAGNOSIS — E291 Testicular hypofunction: Principal | ICD-10-CM

## 2018-04-22 DIAGNOSIS — N3281 Overactive bladder: Secondary | ICD-10-CM

## 2018-04-22 MED ORDER — OXYBUTYNIN CHLORIDE 5 MG TABLET
ORAL_TABLET | Freq: Two times a day (BID) | ORAL | 0 refills | 0 days | Status: CP
Start: 2018-04-22 — End: 2018-05-20

## 2018-04-22 NOTE — Telephone Encounter (Addendum)
Unable to leave a message, voicemail was not offered, and male voice message was very garbled. I mailed a notice to pt of the overread and delay.

## 2018-04-22 NOTE — Telephone Encounter (Signed)
-----   Message from Elinor ParkinsonMax T Hyatt, [place] sent at 04/21/2018  1:01 PM EST ----- Send with the other over read for him.
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
## 2018-04-23 ENCOUNTER — Encounter: Payer: Self-pay | Admitting: *Deleted

## 2018-04-23 NOTE — Telephone Encounter (Signed)
[facility] disc.

## 2018-04-27 NOTE — Telephone Encounter (Signed)
[facility] and mailed to [facility].

## 2018-04-28 ENCOUNTER — Ambulatory Visit: Payer: Medicaid Other | Admitting: Physical Therapy

## 2018-04-28 DIAGNOSIS — M545 Low back pain, unspecified: Secondary | ICD-10-CM

## 2018-04-28 DIAGNOSIS — M6281 Muscle weakness (generalized): Secondary | ICD-10-CM

## 2018-04-28 DIAGNOSIS — R2689 Other abnormalities of gait and mobility: Secondary | ICD-10-CM

## 2018-04-29 ENCOUNTER — Encounter: Payer: Self-pay | Admitting: Physical Therapy

## 2018-04-29 ENCOUNTER — Ambulatory Visit: Admit: 2018-04-29 | Discharge: 2018-04-30 | Payer: MEDICAID

## 2018-04-29 DIAGNOSIS — Z79899 Other long term (current) drug therapy: Principal | ICD-10-CM

## 2018-04-29 DIAGNOSIS — L732 Hidradenitis suppurativa: Secondary | ICD-10-CM

## 2018-04-29 LAB — CBC W/ AUTO DIFF
BASOPHILS ABSOLUTE COUNT: 0.1 10*9/L (ref 0.0–0.1)
BASOPHILS RELATIVE PERCENT: 0.6 %
EOSINOPHILS RELATIVE PERCENT: 1.5 %
HEMATOCRIT: 46.3 % (ref 41.0–53.0)
HEMOGLOBIN: 14.9 g/dL (ref 13.5–17.5)
LARGE UNSTAINED CELLS: 1 % (ref 0–4)
LYMPHOCYTES ABSOLUTE COUNT: 2.2 10*9/L (ref 1.5–5.0)
LYMPHOCYTES RELATIVE PERCENT: 22 %
MEAN CORPUSCULAR HEMOGLOBIN CONC: 32.1 g/dL (ref 31.0–37.0)
MEAN CORPUSCULAR HEMOGLOBIN: 31.1 pg (ref 26.0–34.0)
MEAN CORPUSCULAR VOLUME: 96.9 fL (ref 80.0–100.0)
MEAN PLATELET VOLUME: 11.9 fL — ABNORMAL HIGH (ref 7.0–10.0)
MONOCYTES ABSOLUTE COUNT: 0.7 10*9/L (ref 0.2–0.8)
MONOCYTES RELATIVE PERCENT: 7.4 %
NEUTROPHILS ABSOLUTE COUNT: 6.7 10*9/L (ref 2.0–7.5)
NEUTROPHILS RELATIVE PERCENT: 67.3 %
RED BLOOD CELL COUNT: 4.78 10*12/L (ref 4.50–5.90)
RED CELL DISTRIBUTION WIDTH: 15.3 % — ABNORMAL HIGH (ref 12.0–15.0)
WBC ADJUSTED: 9.9 10*9/L (ref 4.5–11.0)

## 2018-04-29 LAB — MONOCYTES ABSOLUTE COUNT: Lab: 0.7

## 2018-04-29 MED ORDER — ADALIMUMAB 80 MG/0.8 ML SUBCUTANEOUS PEN KIT
PACK | SUBCUTANEOUS | 0 refills | 0.00000 days | Status: CP
Start: 2018-04-29 — End: 2018-09-10
  Filled 2018-05-04: qty 3, 28d supply, fill #0

## 2018-04-29 MED ORDER — DOXYCYCLINE HYCLATE 100 MG CAPSULE
ORAL_CAPSULE | Freq: Two times a day (BID) | ORAL | 2 refills | 0.00000 days | Status: CP
Start: 2018-04-29 — End: 2018-09-10

## 2018-04-29 MED ORDER — HUMIRA PEN CITRATE FREE 40 MG/0.4 ML
SUBCUTANEOUS | 5 refills | 0.00000 days | Status: CP
Start: 2018-04-29 — End: 2018-09-10

## 2018-04-29 NOTE — Therapy (Signed)
[facility] [address] [place], [place], [address] Phone: [phone]   Fax:  [phone]  Physical Therapy Treatment  Patient Details  Name: Jason Davidson MRN: [id number] Date of Birth: 03-11-1981 Referring Provider (PT): Verita Lamb, FNP   Encounter Date: 04/28/2018  PT End of Session - 04/29/18 1800    Visit Number  5    Number of Visits  8    Date for PT Re-Evaluation  05/18/18    PT Start Time  0901    PT Stop Time  0958    PT Time Calculation (min)  57 min    Activity Tolerance  Patient tolerated treatment well;Patient limited by pain    Behavior During Therapy  [facility] for tasks assessed/performed       Past Medical History:  Diagnosis Date  . Asthma   . Depressed   . Diabetes mellitus without complication ([place])   . IBS (irritable bowel syndrome)   . Morbid obesity ([place])     Past Surgical History:  Procedure Laterality Date  . EYE SURGERY      There were no vitals filed for this visit.  Subjective Assessment - 04/29/18 1759    Subjective  Pt. reports back pain is not getting much better.  Pt. c/o 8/10 low back pain prior to tx. session while sitting in chair.  Pt. reports difficulty with the hamstring/ strap ex.     Limitations  Sitting;Lifting;Standing;Walking;House hold activities    How long can you sit comfortably?  30-45 min    How long can you stand comfortably?  2-3 min    How long can you walk comfortably?  30 min    Diagnostic tests  imaging performed previously by MD.    Patient Stated Goals  Become pain-free    Currently in Pain?  Yes    Pain Score  8     Pain Location  Back    Pain Orientation  Lower    Pain Descriptors / Indicators  Aching    Pain Type  Chronic pain         TREATMENT   Therapeutic Exercise:  Seated Marches 2x15 each leg Seated LAQ 2x15 each leg Standing hip flexion/ abd./ ext. 20x each at //-bars with UE assist.  Cuing for posture/ proper technique.  B  Hamstring stretch by PT on blue mat table, 3x30 sec  Supine single knee to chest 5x each.  Supine L/R neural glides 2x each (pain limited).  Posterior pelvic tilts 10x10 sec hold Hooklying trunk rotations 5x30 sec each side Supine SAQ/ partial bridging with core activation 10x2. (fatigue).       Treatments unbilled:  MHP in supine during therapeutic exercises     PT Short Term Goals - 04/15/18 0746      PT SHORT TERM GOAL #1   Title  Pt will be independent with HEP in order to improve strength and decrease back pain in order to improve pain-free function at home and work.     Baseline  pt. continues to require cuing/ instruct in proper HEP/ progression     Time  4    Period  Weeks    Status  Not Met    Target Date  05/18/18        PT Long Term Goals - 04/14/18 0935      PT LONG TERM GOAL #1   Title  Pt. will complete FOTO and improve to 49 to improve daily functional  mobility.    Baseline  FOTO 28 on 03/23/18; FOTO 31 on 04/14/18    Time  8    Period  Weeks    Status  Partially Met    Target Date  05/18/18      PT LONG TERM GOAL #2   Title  Pt will decrease worst back pain as reported on NPRS by at least 2 points in order to demonstrate clinically significant reduction in back pain.     Baseline  03/23/18 Worst Pain: 9/10; 04/14/18 Worst Pain: 8/10    Time  8    Period  Weeks    Status  Partially Met    Target Date  05/18/18      PT LONG TERM GOAL #3   Title  Pt will increase strength of B Hip by at least 1/2 MMT grade in order to demonstrate improvement in strength and function.    Baseline  Hip Flex, abd, add, R/L: 4/4 with pain    Time  8    Period  Weeks    Status  Partially Met    Target Date  05/18/18      PT LONG TERM GOAL #4   Title  Pt will decrease 5TSTS by at least 3 seconds in order to demonstrate clinically significant improvement in LE strength    Baseline  03/23/18 5TSTS: 44.15 sec; 04/14/18 5TSTS: 47.38 sec.    Time  8    Period  Weeks     Status  Partially Met    Target Date  05/18/18         Plan - 04/29/18 1801    Clinical Impression Statement  Pt. pain limited with supine/ seated hamstring stretches.  Pt. easily fatigued with basic supine LE ex.  No change to HEP and pt. instructed to focus on current ex. and walk every hour.  Pt. limited with hip flexion in all positions with increase c/o low back pain.      Clinical Presentation  Evolving    Clinical Decision Making  Moderate    Rehab Potential  Fair    PT Frequency  1x / week    PT Duration  8 weeks    PT Treatment/Interventions  ADLs/Self Care Home Management;Aquatic Therapy;Canalith Repostioning;Electrical Stimulation;Moist Heat;Iontophoresis 28m/ml Dexamethasone;Traction;Gait training;Stair training;Functional mobility training;Therapeutic activities;Therapeutic exercise;Balance training;Neuromuscular re-education;Patient/family education;Orthotic Fit/Training;Manual techniques;Taping;Energy conservation;Dry needling;Passive range of motion;Spinal Manipulations;Joint Manipulations    PT Next Visit Plan  Progress HEP    PT Home Exercise Plan  see notes       Patient will benefit from skilled therapeutic intervention in order to improve the following deficits and impairments:  Abnormal gait, Decreased endurance, Hypomobility, Obesity, Decreased activity tolerance, Decreased strength, Pain, Decreased mobility, Difficulty walking, Improper body mechanics, Decreased range of motion, Impaired flexibility, Postural dysfunction  Visit Diagnosis: Lumbar pain  Muscle weakness (generalized)  Antalgic gait     Problem List Patient Active Problem List   Diagnosis Date Noted  . Varicose veins of both lower extremities with pain 10/16/2017  . IBS (irritable bowel syndrome) 06/18/2017  . Chronic venous insufficiency 12/31/2016  . Lymphedema 12/31/2016  . Leg pain 12/31/2016  . Diabetes [place] 12/31/2016  . GERD (gastroesophageal reflux disease) 12/31/2016  .  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 H/O retinal detachment 10/01/2016  . No diabetic retinopathy in both eyes 10/01/2016  . Cellulitis of right lower extremity 06/19/2016  . Chronic joint pain 11/16/2015  . Skin infection 05/15/2015  . Depressed   .  Tick bite 10/20/2014  . Primary insomnia 07/08/2014  . Candidal balanitis 02/16/2014  . Skin tag 01/17/2014  . Left shoulder pain 10/04/2013  . Anxiety 09/13/2013  . Diarrhea 09/13/2013  . Bleeding external hemorrhoids 09/03/2013  . NAFLD (nonalcoholic fatty liver disease) 08/12/2013  . Headache 07/05/2013  . Abdominal pain 03/05/2013  . Morbid (severe) obesity due to excess calories ([place]) 03/05/2013  . Suicidal ideation 02/09/2013  . Obstructive sleep apnea 07/21/2012  . 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   Pura Spice, PT, DPT # [phone] 04/29/2018, 6:05 PM  [facility] [address] [place], [place], [address] Phone: [phone]   Fax:  [phone]  Name: Jason Davidson MRN: [id number] Date of Birth: Jan 20, 1981

## 2018-04-29 NOTE — Unmapped (Signed)
Per test claim for Humira 80 mg/0.8 ml Pens at the [facility], patient needs Medication Assistance Program for Prior Authorization.    Per test claim for Humira 40 mg/0.4 ml Pens at the [facility], patient needs Medication Assistance Program for Prior Authorization.

## 2018-04-29 NOTE — Unmapped (Signed)
Patient Education     Hidradenitis Suppurativa: Care Instructions  Your Care Instructions    Hidradenitis suppurativa (say hih-drad-uh-NY-tus sup-yur-uh-TY-vuh) is a skin condition that causes lumps on the skin that look like pimples or boils. The lumps are usually painful and can break open and drain blood and bad-smelling pus. The condition can come and go for many years.  Treatment for this condition may include antibiotics and other medicines. You may need surgery to remove the lumps. Home care includes wearing loose-fitting clothes and washing the area gently. You can help prevent lumps from coming back by staying at a healthy weight and not smoking.  Doctors don't know exactly how this condition starts. But they do know that something irritates and inflames the hair follicles, causing them to swell and form lumps. This skin condition can't be spread from person to person (isn't contagious).  Follow-up care is a key part of your treatment and safety. Be sure to make and go to all appointments, and call your doctor if you are having problems. It's also a good idea to know your test results and keep a list of the medicines you take.  How can you care for yourself at home?  ??Skin care  ?? ?? Wash the area every day with mild soap. Use your hands rather than a washcloth or sponge when you wash that part of your body.   ?? ?? Leave the affected areas uncovered when you can. If you have lumps that are draining, you can cover them with a bandage or other dressing. Put petroleum jelly (such as Vaseline) on the dressing to help keep it from sticking.   ?? ?? Wear-loose fitting clothes that don't rub against the area. Avoid activities that cause skin to rub together.   ?? ?? If you have pain, try a warm compress. Soak a towel or washcloth in warm water, wring it out, and place it on the affected skin for about 10 minutes.   Medicines  ?? ?? Be safe with medicines. Take your medicines exactly as prescribed. Call your doctor if you think you are having a problem with your medicine. You will get more details on the specific medicines your doctor prescribes.   ?? ?? If your doctor prescribed antibiotics, take them as directed. Do not stop taking them just because you feel better. You need to take the full course of antibiotics.   ??Lifestyle choices  ?? ?? If you smoke, think about quitting. Smoking can make the condition worse. If you need help quitting, talk to your doctor about stop-smoking programs and medicines. These can increase your chances of quitting for good.   ?? ?? Stay at a healthy weight, or lose weight, by eating healthy foods and being physically active. Being overweight could make this condition worse.   When should you call for help?  Call your doctor now or seek immediate medical care if:  ?? ?? You have symptoms of infection, such as:  ? Increased pain, swelling, warmth, or redness.  ? Red streaks leading from the area.  ? Pus draining from the area.  ? A fever.   ??Watch closely for changes in your health, and be sure to contact your doctor if:  ?? ?? You do not get better as expected.   Where can you learn more?  Go to [facility] at [email or web address]/.  [facility] under the Resources menu. Enter [phone] in the search box to learn more about Hidradenitis Suppurativa: Care Instructions.  Current  as of: September 08, 2017  Content Version: 12.2  ?? 2006-2019 [employer]. Care instructions adapted under license by [facility]. If you have questions about a medical condition or this instruction, always ask your healthcare professional. Healthwise, Incorporated disclaims any warranty or liability for your use of this information.

## 2018-04-29 NOTE — Unmapped (Signed)
Assessment and Plan:    Hidradenitis suppurativa, Hurley stage II: Localized to R inguinal region with inflammatory nodules and sinus tract.   - Discussed relapsing, remitting nature of condition  - Currently no on any treatment   - Will avoid clindamycin as causes significant diarrhea   - Will escalate therapy with Humira and use doxycyline for 3 months in the interim   - doxycycline (VIBRAMYCIN) 100 MG capsule; Take 1 capsule (100 mg total) by mouth Two (2) times a day.  Dispense: 60 capsule; Refill: 2  - adalimumab (HUMIRA,CF, PEN CROHNS-UC-HS) 80 mg/0.8 mL PnKt; Inject the contents of 2 pens (160 mg) under the skin on day 1. Then inject the contents of 1 pen (80 mg) on day 15.  Dispense: 1 kit; Refill: 0  - ADALIMUMAB PEN CITRATE FREE 40 MG/0.4 ML; Inject the contents of 1 pen (40 mg total) under the skin every seven (7) days.  Dispense: 2 kit; Refill: 5  - ILK as below   - May benefit from unroofing in the future given localization     High risk medication use  - Prior CMP appropriate   - Hepatitis B Core Antibody, total; Future  - Hepatitis B Surface Antibody; Future  - Hepatitis B Surface Antigen; Future  - Hepatitis C Antibody; Future  - Quantiferon TB Gold Plus; Future  - CBC w/ Differential; Future    Kenalog Injection Procedure Note:  After the risks, benefits, and alternatives were discussed and verbal informed consent was obtained, the respective cutaneous areas/lesions were prepped with alcohol and then Kenalog 40 mg/cc was injected intradermally using a total volume of 0.7 cc dispersed between the areas/lesions. The total number of lesions injected were 2. The patient tolerated the procedure well and was given instructions on post-procedure care.    RTC: 3 months with Dr. Janyth Contes     Chief Complaint: Follow-up hidradenitis suppurativa    HPI: This is a pleasant 37 y.o. male who presents today for f/u HS, last seen by Dr. Trisha Mangle 9/17 for HS. At that time, doxy 100mg  BID and topical clindamycin were started and performed ILK on two areas. This was improved, but has worsened on and off over the last 2 years in the same areas. He has some new areas on the thighs and groin occasionally, but none in axilla. He is interested in a more long-term treatment option as he has had other medical complications needing to take antibiotics for and would like to avoid them. He reports diarrhea with clindamycin.. No other skin concerns today.    Past Medical History:   - No history of skin cancer  - Insulin dependent dependent diabetes mellitus    FH/SH: Reviewed in Epic    Medications: Reviewed in Epic    Review of Systems: Baseline state of health. No recent illnesses, denies fevers, chills, loss of apetite or weight changes. No other skin complaints except as noted per HPI.    Physical Examination:  General: Well-developed, well-nourished male in no acute distress, resting comfortably.  Neuro: A, A, Ox 3. Answers questions appropriately.  Skin: Per patient request, focused exam of abdomen and groin performed and notable for:  - Several erythematous nodules on R inguinal region, with 2 larger ones that are tender to touch. There appears to be a deeper sinus tract. No fluctuance.   - Hyperpigmented macules on the b/l thighs   - small scar on right inguinal crease     -All other areas examined were normal or  had no significant findings    ______________________________________________________________________    The patient was seen and examined by Welford Roche, MD who agrees with the assessment and plan as above.

## 2018-04-30 LAB — HEPATITIS B SURFACE ANTIBODY QUANT: Hepatitis B virus surface Ab:ACnc:Pt:Ser:Qn:: 11.64 — ABNORMAL HIGH

## 2018-04-30 LAB — HEPATITIS B CORE TOTAL ANTIBODY: Hepatitis B virus core Ab:PrThr:Pt:Ser/Plas:Ord:IA: NONREACTIVE

## 2018-04-30 LAB — HEPATITIS C ANTIBODY: Hepatitis C virus Ab:PrThr:Pt:Ser:Ord:: NONREACTIVE

## 2018-04-30 LAB — HEPATITIS B SURFACE ANTIGEN: Hepatitis B virus surface Ag:PrThr:Pt:Ser:Ord:: NONREACTIVE

## 2018-04-30 NOTE — Unmapped (Signed)
[facility] Medication Referral: [place] APPROVED    Medication (Brand/Generic): HUMIRA    Initial Test Claim completed with resulted information below:    Patient ABLE to fill at [facility]:  [place] MEDICAID  Anticipated Copay: $3 PER [place] TRACKS  Is anticipated copay with a copay card or grant? NO    Does patient's insurance plan only allow a 15 day supply for the first 6 fills in the [place] Program? NO  If yes, inform patient they can request to dis-enroll from the [facility] by calling the patient help desk at N/A.      If the copay is under the $25 defined limit, per policy there will be no further investigation of need for financial assistance at this time unless patient requests. This referral has been communicated to the provider and handed off to the [facility] team for further processing and filling of prescribed medication.   ______________________________________________________________________  Please utilize this referral for viewing purposes as it will serve as the central location for all relevant documentation and updates.

## 2018-05-02 LAB — QUANTIFERON TB GOLD PLUS
QUANTIFERON ANTIGEN 1 MINUS NIL: 0.02 [IU]/mL
QUANTIFERON ANTIGEN 2 MINUS NIL: 0.03 [IU]/mL
QUANTIFERON TB NIL VALUE: 0.1 [IU]/mL

## 2018-05-02 LAB — QUANTIFERON MITOGEN: Lab: >10

## 2018-05-04 MED ORDER — INSULIN U-100 REGULAR HUMAN 100 UNIT/ML INJECTION SOLUTION
1 refills | 0 days | Status: CP
Start: 2018-05-04 — End: 2018-11-22

## 2018-05-04 MED ORDER — EMPTY CONTAINER
2 refills | 0 days
Start: 2018-05-04 — End: ?

## 2018-05-04 MED ORDER — BLOOD-GLUCOSE METER
0 refills | 0 days | Status: CP
Start: 2018-05-04 — End: 2019-02-09

## 2018-05-04 MED FILL — EMPTY CONTAINER: 120 days supply | Qty: 1 | Fill #0

## 2018-05-04 MED FILL — HUMIRA PEN CITRATE FREE STARTER PACK FOR CROHN'S/UC/HS 3 X 80 MG/0.8 ML: 28 days supply | Qty: 3 | Fill #0 | Status: AC

## 2018-05-04 MED FILL — EMPTY CONTAINER: 120 days supply | Qty: 1 | Fill #0 | Status: AC

## 2018-05-04 NOTE — Unmapped (Signed)
[facility]   Patient Onboarding/Medication Counseling    [name] is a 37 y.o. male with hidradenitis who I am counseling today on initiation of therapy.    Medication: Humira citrate free, sharps container    Verified patient's date of birth / HIPAA.      Education Provided: ?    Dose/Administration discussed: Inject the contents of 2 pens (160 mg) . This medication should be taken  without regard to food.  Stressed the importance of taking medication as prescribed and to contact provider if that changes at any time.  Discussed missed dose instructions.    Storage requirements: this medicine should be stored in the refrigerator.     Side effects / precautions discussed: Discussed common side effects, including injection site reaction, increased risk of infection. If patient experiences fever/chills or other severe infection, they need to call the doctor.  Patient will receive a drug information handout with shipment.    Handling precautions / disposal reviewed:  Patient will dispose of needles in a sharps container or empty laundry detergent bottle.    Drug Interactions: other medications reviewed and up to date in Epic.  No drug interactions identified.    Comorbidities/Allergies: reviewed and up to date in Epic.    Verified therapy is appropriate and should continue      Delivery Information    Medication Assistance provided: Prior Authorization    Anticipated copay of $3 reviewed with patient. Verified delivery address in Epic.    Scheduled delivery date: Tues, Nov 26    Medication will be delivered via UPS to the home address in [place].  This shipment will not require a signature.      Explained the services we provide at [facility] and that each month we would call to set up refills.  Stressed importance of returning phone calls so that we could ensure they receive their medications in time each month.  Informed patient that we should be setting up refills 7-10 days prior to when they will run out of medication.  Informed patient that welcome packet will be sent.      Patient verbalized understanding of the above information as well as how to contact the pharmacy at [phone] option 4 with any questions/concerns.  The pharmacy is open Monday through Friday 8:30am-4:30pm.  A pharmacist is available 24/7 via pager to answer any clinical questions they may have.        Patient Specific Needs      ? Patient has no physical, cognitive, or cultural barriers.    ? Patient prefers to have medications discussed with  Patient     ? Patient is able to read and understand education materials at a high school level or above.    ? Patient's primary language is  English           Presenter, broadcasting  [facility] Specialty Pharmacist

## 2018-05-04 NOTE — Unmapped (Signed)
Notified patient of appropriate lab results via Mychart. No change to plan. Will Start Humira. H

## 2018-05-04 NOTE — Unmapped (Signed)
Mr. called and asked for a new Rx for a Blood Glucose Meter be sent to [facility] in [place].  Thank you.

## 2018-05-05 ENCOUNTER — Ambulatory Visit: Payer: Medicaid Other | Admitting: Physical Therapy

## 2018-05-11 NOTE — Unmapped (Signed)
May 05, 2018:    I spoke with [name] this afternoon regarding the Inflamed cystic lesion on his right groin that was injected with Kenalog on his last visit (Apr 29, 2018).  He is feeling much better, the pain in the area injected is much better.  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    Next appointment with Dr. Janyth Contes.      L.A. Trisha Mangle, M.D.  Attending Dermatologist

## 2018-05-12 ENCOUNTER — Ambulatory Visit: Payer: Medicaid Other | Attending: Family | Admitting: Physical Therapy

## 2018-05-12 ENCOUNTER — Encounter: Payer: Self-pay | Admitting: Podiatry

## 2018-05-12 DIAGNOSIS — M545 Low back pain: Secondary | ICD-10-CM | POA: Insufficient documentation

## 2018-05-12 DIAGNOSIS — R2689 Other abnormalities of gait and mobility: Secondary | ICD-10-CM | POA: Insufficient documentation

## 2018-05-12 DIAGNOSIS — M6281 Muscle weakness (generalized): Secondary | ICD-10-CM | POA: Insufficient documentation

## 2018-05-12 MED ORDER — BLOOD SUGAR DIAGNOSTIC STRIPS
Freq: Three times a day (TID) | 1 refills | 0.00000 days | Status: CP
Start: 2018-05-12 — End: 2019-02-10

## 2018-05-12 NOTE — Unmapped (Deleted)
Assessment and Plan:     There are no diagnoses linked to this encounter.     Barriers to goals identified and addressed. Pertinent handouts were given today and reviewed with the patient as indicated.  The Care Plan and Self-Management goals have been included on the AVS and the AVS has been printed.   I encouraged the patient to keep regular logs for me to review at their next visit. Any outside resources or referrals needed at this time are noted above. Patient's current medications have been reviewed. Any new medications prescribed have been discussed, and side effects have been addressed.  Have assessed the patient's understanding, respsonse, and barriers to adherence to medications.Patient voiced understanding and all questions have been answered to satisfaction.     Subjective:      Logan Quinn is a 37 y.o. male being seen for a comprehensive physical exam.        No chief complaint on file.      Lifestyle:  Employment:  Exercise:   Diet:   Caffeine Use:  Last Dentist:  Last Vision:    PHQ-2 Score:       ASCVD risk:  The ASCVD Risk score Denman George DC Jr., et al., 2013) failed to calculate for the following reasons:    The 2013 ASCVD risk score is only valid for ages 37 to 75    Note: For patients with SBP <90 or >200, Total Cholesterol <130 or >320, HDL <20 or >100 which are outside of the allowable range, the calculator will use these upper or lower values to calculate the patient???s risk score.       Social History:     Social History     Socioeconomic History   ??? Marital status: Married     Spouse name: Not on file   ??? Number of children: Not on file   ??? Years of education: Not on file   ??? Highest education level: Not on file   Occupational History   ??? Not on file   Social Needs   ??? Financial resource strain: Not on file   ??? Food insecurity:     Worry: Not on file     Inability: Not on file   ??? Transportation needs:     Medical: Not on file     Non-medical: Not on file   Tobacco Use   ??? Smoking status: Never Smoker   ??? Smokeless tobacco: Never Used   Substance and Sexual Activity   ??? Alcohol use: Never     Alcohol/week: 0.0 standard drinks     Comment: rare social   ??? Drug use: Never   ??? Sexual activity: Yes     Partners: Female   Lifestyle   ??? Physical activity:     Days per week: Not on file     Minutes per session: Not on file   ??? Stress: Not on file   Relationships   ??? Social connections:     Talks on phone: Not on file     Gets together: Not on file     Attends religious service: Not on file     Active member of club or organization: Not on file     Attends meetings of clubs or organizations: Not on file     Relationship status: Not on file   Other Topics Concern   ??? Do you use sunscreen? Yes   ??? Tanning bed use? No   ??? Are you  easily burned? No   ??? Excessive sun exposure? No   ??? Blistering sunburns? No   Social History Narrative   ??? Not on file       Past Medical/Surgical History:     Past Medical History:   Diagnosis Date   ??? Acne    ??? Allergic    ??? Anxiety    ??? Depression    ??? Diabetes mellitus (CMS-HCC)    ??? GERD (gastroesophageal reflux disease)    ??? Gout    ??? Hypertension    ??? IBS (irritable bowel syndrome)    ??? 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 Liver disease    ??? Migraines    ??? Morbid obesity with BMI of 60.0-69.9, adult (CMS-HCC)    ??? 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 Obstructive sleep apnea    ??? OSA on CPAP    ??? Severe obstructive sleep apnea    ??? Trapezius muscle strain 12/07/2013   ??? Urinary incontinence, nocturnal enuresis    ??? Venous insufficiency      Past Surgical History:   Procedure Laterality Date   ??? EYE SURGERY  11/15   ??? PR COLONOSCOPY FLX DX W/COLLJ SPEC WHEN PFRMD N/A 03/24/2013    Procedure: COLONOSCOPY, FLEXIBLE, PROXIMAL TO SPLENIC FLEXURE; DIAGNOSTIC, W/WO COLLECTION SPECIMEN BY BRUSH OR WASH;  Surgeon: Clint Bolder, MD;  [facility];  Service: Gastroenterology   ??? PR EYE SURG POST SGMT PROC UNLISTED Left     pneumatic retinopexy OS   ??? PR UPPER GI ENDOSCOPY,DIAGNOSIS N/A 02/02/2013    Procedure: UGI ENDO, INCLUDE ESOPHAGUS, STOMACH, & DUODENUM &/OR JEJUNUM; DX W/WO COLLECTION SPECIMN, BY BRUSH OR WASH;  Surgeon: Malcolm Metro, MD;  Location: [facility];  Service: Gastroenterology   ??? [place] PYLORIC STENOSIS ([place] HISTORICAL RESULT)         Family History:     Family History   Problem Relation Age of Onset   ??? Cancer Maternal Grandfather    ??? Hearing loss Maternal Grandfather    ??? Cancer Paternal Grandfather    ??? COPD Paternal Grandmother    ??? Arthritis Paternal Grandmother    ??? Depression Paternal Grandmother    ??? Diabetes Mother    ??? Heart disease Mother    ??? Migraines Mother    ??? Arthritis Mother    ??? Depression Mother    ??? GER disease Mother    ??? Hypertension Mother    ??? Angina Mother    ??? COPD Mother    ??? Glaucoma Mother    ??? Diabetes Sister    ??? Migraines Sister    ??? Asthma Sister    ??? Depression Sister    ??? Diabetes Brother    ??? Asthma Brother    ??? Diabetes Maternal Grandmother    ??? Heart disease Maternal Grandmother    ??? Migraines Maternal Grandmother    ??? Depression Maternal Grandmother    ??? Angina Maternal Grandmother    ??? Hypertension Maternal Grandmother    ??? Diabetes Maternal Uncle    ??? Diabetes Maternal Uncle    ??? Liver disease Maternal Uncle    ??? Kidney disease Maternal Uncle    ??? Asthma Brother    ??? Colorectal Cancer Neg Hx    ??? Esophageal cancer Neg Hx    ??? Liver cancer Neg Hx    ??? Pancreatic cancer Neg Hx    ??? Stomach cancer Neg  Hx    ??? Glaucoma Neg Hx    ??? Amblyopia Neg Hx    ??? Blindness Neg Hx    ??? Retinal detachment Neg Hx    ??? Strabismus Neg Hx    ??? Macular degeneration Neg Hx    ??? Anesthesia problems Neg Hx    ??? Broken bones Neg Hx    ??? Clotting disorder Neg Hx    ??? Collagen disease Neg Hx    ??? Dislocations Neg Hx    ??? Fibromyalgia Neg Hx    ??? Gout Neg Hx    ??? Hemophilia Neg Hx    ??? Osteoporosis Neg Hx    ??? Rheumatologic disease Neg Hx    ??? Scoliosis Neg Hx    ??? Severe sprains Neg Hx    ??? Sickle cell anemia Neg Hx ??? Spinal Compression Fracture Neg Hx    ??? Melanoma Neg Hx    ??? Basal cell carcinoma Neg Hx    ??? Squamous cell carcinoma Neg Hx    ??? Deep vein thrombosis Neg Hx        Allergies:     Erythromycin; Penicillins; Sulfa (sulfonamide antibiotics); Other; Erythromycin base; and Sulfasalazine    Current Medications:     Current Outpatient Medications   Medication Sig Dispense Refill   ??? ACCU-CHEK SOFTCLIX LANCETS lancets TEST three times a day 100 each 3   ??? acetaminophen (TYLENOL) 500 MG tablet Take 2 tablets (1,000 mg total) by mouth Three (3) times a day. 30 tablet 0   ??? acetaminophen-codeine (TYLENOL #3) 300-30 mg per tablet Take 1 tablet by mouth every six (6) hours as needed for pain. 60 tablet 0   ??? acetaminophen-codeine (TYLENOL #3) 300-30 mg per tablet Take 1 tablet by mouth every six (6) hours as needed for pain. 60 tablet 0   ??? adalimumab (HUMIRA,CF, PEN CROHNS-UC-HS) 80 mg/0.8 mL PnKt Inject the contents of 2 pens (160 mg) under the skin on day 1. Then inject the contents of 1 pen (80 mg) on day 15. 1 kit 0   ??? ADALIMUMAB PEN CITRATE FREE 40 MG/0.4 ML Inject the contents of 1 pen (40 mg total) under the skin every seven (7) days. 2 kit 5   ??? AEROCHAMBER PLUS FLOW-VU Spcr Use as directed with your inhaler 1 each 0   ??? albuterol (PROAIR HFA) 90 mcg/actuation inhaler ProAir HFA 90 mcg/actuation aerosol inhaler     ??? albuterol 2.5 mg /3 mL (0.083 %) nebulizer solution Inhale 3 mL (2.5 mg total) by nebulization every four (4) hours as needed for wheezing. 25 vial 2   ??? albuterol HFA 90 mcg/actuation inhaler Inhale 2 puffs every four (4) hours as needed for wheezing. 3 Inhaler 1   ??? ALPRAZolam (XANAX) 0.5 MG tablet Take 1 tablet (0.5 mg total) by mouth Three (3) times a day. (Patient taking differently: Take 1 mg by mouth Three (3) times a day. ) 60 tablet 0   ??? blood sugar diagnostic (ACCU-CHEK AVIVA PLUS TEST STRP) Strp by Other route Three (3) times a day. 400 strip 1   ??? blood sugar diagnostic Strp Use 1 strip 3 times a day with meter 100 each 11   ??? blood-glucose meter (ACCU-CHEK AVIVA PLUS METER) Misc Accu Chek Aviva Meter Use to test Blood sugar as directed E11.65 1 each 0   ??? busPIRone (BUSPAR) 10 MG tablet take 1 tablet by mouth three times a day if needed for anxiety  0   ???  cetirizine (ZYRTEC) 10 MG tablet Take 1 tablet (10 mg total) by mouth daily. 90 tablet 1   ??? chlorhexidine (PERIDEX) 0.12 % solution 15 mL by Oromucosal route Two (2) times a day. 473 mL 2   ??? clotrimazole-betamethasone (LOTRISONE) cream   0   ??? cyclobenzaprine (FLEXERIL) 10 MG tablet Take 1 tablet (10 mg total) by mouth Three (3) times a day as needed for muscle spasms. 30 tablet 1   ??? dextroamphetamine-amphetamine (ADDERALL XR) 20 MG 24 hr capsule Take 20 mg by mouth two (2) times a day.     ??? diclofenac (VOLTAREN) 50 MG EC tablet Take 1 tablet (50 mg total) by mouth two (2) times a day as needed. 60 each 2   ??? dicyclomine (BENTYL) 10 mg capsule Take 1 capsule (10 mg total) by mouth Three (3) times a day. 90 capsule 5   ??? diphenoxylate-atropine (LOMOTIL) 2.5-0.025 mg per tablet Take 1 tablet by mouth two (2) times a day as needed for diarrhea. 30 tablet 11   ??? doxycycline (VIBRAMYCIN) 100 MG capsule Take 1 capsule (100 mg total) by mouth Two (2) times a day. 60 capsule 2   ??? DULoxetine (CYMBALTA) 60 MG capsule Take 60 mg by mouth.     ??? empty container Misc Use as directed to dispose of Humira injections 1 each 2   ??? erenumab-aooe (AIMOVIG AUTOINJECTOR) 70 mg/mL AtIn Inject 1 mL under the skin every twenty-eight (28) days. 1 mL 3   ??? erenumab-aooe 70 mg/mL AtIn Inject 70 mg under the skin every twenty-eight (28) days. 1 mL 3   ??? fluticasone (FLONASE) 50 mcg/actuation nasal spray 1 spray each nare daily 16 g 5   ??? fluticasone propion-salmeterol (ADVAIR DISKUS) 500-50 mcg/dose diskus Inhale 1 puff 2 (two) times a day. 13 Inhaler 1   ??? gabapentin (NEURONTIN) 300 MG capsule Take 300 mg by mouth Three (3) times a day.     ??? glipiZIDE (GLUCOTROL) 10 MG tablet Take 2 tablets (20 mg total) by mouth Two (2) times a day (30 minutes before a meal). 360 tablet 1   ??? hydrocortisone (ANUSOL-HC) 25 mg suppository Insert 1 suppository (25 mg total) into the rectum Two (2) times a day. 24 each 1   ??? hydrocortisone (PROCTOSOL HC) 2.5 % rectal cream Insert 1 application into the rectum two (2) times a day as needed. 28.35 g 2   ??? ibuprofen (ADVIL,MOTRIN) 800 MG tablet Take 1 tablet (800 mg total) by mouth every eight (8) hours as needed. 90 tablet 1   ??? insulin glargine (LANTUS U-100 INSULIN) 100 unit/mL injection Inject 0.8 mL (80 Units total) under the skin nightly. 72 mL 1   ??? insulin regular (HUMULIN R REGULAR U-100 INSULN) 100 unit/mL injection SLIDING SCALE BS 0-150= 0 UNITS 150-200= 3 UNITS, 200-250= 6 UNITS, 250-300= 9 UNITS, 300-350= 12 UNITS 40 mL 1   ??? insulin syringe-needle U-100 (BD INSULIN SYRINGE ULTRA-FINE) 1 mL 30 gauge x 1/2 Syrg USE AS DIRECTED. WITH LANTUS 100 each 1   ??? lancets (ACCU-CHEK SOFTCLIX LANCETS) Misc Inject 1 each under the skin Three (3) times a day. 300 each 11   ??? lidocaine (XYLOCAINE) 5 % ointment Apply to painful areas in the groin TID as needed for pain 120 g 3   ??? loperamide (IMODIUM) 2 mg capsule Take 2 mg by mouth 4 (four) times a day as needed for diarrhea.     ??? MITIGARE 0.6 mg cap capsule Take 1.2 mg  on Day 1 of flare, followed by 0.6 mg one hour later. Day 2 take 0.6 mg BID until flare resolves. 60 capsule 3   ??? montelukast (SINGULAIR) 10 mg tablet Take 1 tablet (10 mg total) by mouth daily. 90 tablet 1   ??? naproxen (NAPROSYN) 500 MG tablet Take 1 tablet (500 mg total) by mouth 2 (two) times a day with meals. 60 each 2   ??? nystatin (MYCOSTATIN) 100,000 unit/gram powder Apply to affected area 3 times daily 60 g 3   ??? ondansetron (ZOFRAN) 4 MG tablet Take 1-2 tablets (4-8 mg total) by mouth every twelve (12) hours as needed for nausea. 30 each 2   ??? oxybutynin (DITROPAN) 5 MG tablet Take 1 tablet (5 mg total) by mouth Two (2) times a day. 90 tablet 0   ??? oxyCODONE (ROXICODONE) 5 MG immediate release tablet Take 1 tablet (5 mg total) by mouth every four (4) hours as needed for pain. for up to 12 doses 12 tablet 0   ??? pantoprazole (PROTONIX) 40 MG tablet Take 1 tablet (40 mg total) by mouth daily at 0600. 90 tablet 3   ??? promethazine (PHENERGAN) 25 MG tablet Take 1-2 tablets (25-50 mg total) by mouth nightly as needed for nausea. 30 each 2   ??? propranolol (INDERAL LA) 120 mg 24 hr capsule Take 1 capsule (120 mg total) by mouth daily. 90 capsule 1   ??? ranitidine (ZANTAC) 150 MG tablet Take 1 tablet (150 mg total) by mouth Two (2) times a day. 180 tablet 3   ??? topiramate (TOPAMAX) 25 MG tablet Take 1 tablet (25 mg total) by mouth Two (2) times a day. 180 tablet 1   ??? topiramate (TOPAMAX) 50 MG tablet Take 1 tablet (50 mg total) by mouth Two (2) times a day. 180 tablet 1   ??? traZODone (DESYREL) 100 MG tablet take 2 tablets by mouth at bedtime  0     No current facility-administered medications for this visit.        Health Maintenance:     Health Maintenance Summary w/Most Recent Date       Status Date      Urine Albumin/Creatinine Ratio Next Due 06/30/2018      Done 06/30/2017 Registry Metric: [facility] DM AMB LAST URINE MICROALBUMIN TO CREATININE RATIO     Done 06/30/2017 ALBUMIN / CREATININE URINE RATIO Albumin/Creatinine Ratio           Done 05/24/2016 ALBUMIN / CREATININE URINE RATIO Albumin/Creatinine Ratio           Done 03/24/2015 ALBUMIN / CREATININE URINE RATIO Albumin/Creatinine Ratio           Done 04/05/2013 MICROALBUMIN / CREATININE URINE RATIO Microalbumin/Creatinine Ratio          Hemoglobin A1c Next Due 07/08/2018      Done 01/05/2018 Registry Metric: Last Hemoglobin A1c Date     Done 01/05/2018 POCT GLYCOSYLATED HEMOGLOBIN (HGB A1C) HGB A1C, RAP/PDS           Done 10/06/2017 POCT GLYCOSYLATED HEMOGLOBIN (HGB A1C) HGB A1C, RAP/PDS           Done 06/30/2017 POCT GLYCOSYLATED HEMOGLOBIN (HGB A1C) HGB A1C, RAP/PDS           Done 03/19/2017 POCT GLYCOSYLATED HEMOGLOBIN (HGB A1C) HGB A1C, RAP/PDS           Patient has more history with this topic...    Retinal Eye Exam Next Due 09/18/2018  Done 09/17/2017 SmartData: OPHTH FUNDUS OD PERIPHERY     Done 09/17/2017 SmartData: OPHTH FUNDUS OS PERIPHERY     Done 09/17/2017 SmartData: FINDINGS - PE - EYES - FUNDUSCOPIC - PERIPHERY - RIGHT PERIPHERY NORMAL     Done 09/30/2016 SmartData: OPHTH FUNDUS OD PERIPHERY     Done 09/30/2016 SmartData: OPHTH FUNDUS OS PERIPHERY     Patient has more history with this topic...    Foot Exam Next Due 10/07/2018      Done 10/06/2017 HM DIABETES FOOT EXAM     Done 09/13/2016 [facility] DIABETES FOOT EXAM     Done 08/07/2015 [facility] DIABETES FOOT EXAM    Serum Creatinine Monitoring Next Due 04/08/2019      Done 04/07/2018 COMPREHENSIVE METABOLIC PANEL Creatinine           Done 04/20/2017 BASIC METABOLIC PANEL Creatinine           Done 11/10/2016 COMPREHENSIVE METABOLIC PANEL Creatinine           Done 06/23/2016 BASIC METABOLIC PANEL Creatinine           Done 06/22/2016 BASIC METABOLIC PANEL Creatinine           Patient has more history with this topic...    Potassium Monitoring Next Due 04/08/2019      Done 04/07/2018 COMPREHENSIVE METABOLIC PANEL Potassium           Done 04/20/2017 BASIC METABOLIC PANEL Potassium           Done 11/10/2016 COMPREHENSIVE METABOLIC PANEL Potassium           Done 06/23/2016 BASIC METABOLIC PANEL Potassium           Done 06/22/2016 BASIC METABOLIC PANEL Potassium           Patient has more history with this topic...    DTaP/Tdap/Td Vaccines Next Due 07/17/2023      Done 07/16/2013 Imm Admin: TdaP     Done 07/18/2008 Imm Admin: TdaP     Done 04/29/2000 Imm Admin: Tetanus and diptheria,(adult), adsorbed, 2Lf tetanus toxoid, PF    Pneumococcal Vaccine This plan is no longer active.      Done 12/30/2012 Imm Admin: PNEUMOCOCCAL POLYSACCHARIDE 23    Influenza Vaccine This plan is no longer active.      Done 02/23/2018 Imm Admin: Influenza Vaccine Quad (IIV4 PF) 14mo+ injectable     Done 02/23/2018 Imm Admin: Influenza Virus Vaccine, unspecified formulation     Done 03/19/2017 Imm Admin: Influenza Vaccine Quad (IIV4 PF) 38mo+ injectable     Done 03/19/2017 Imm Admin: Influenza Virus Vaccine, unspecified formulation     Done 03/05/2016 Imm Admin: Influenza Vaccine Quad (IIV4 PF) 68mo+ injectable     Patient has more history with this topic...          Immunizations:     Immunization History   Administered Date(s) Administered   ??? Hepatitis B Vaccine, Unspecified Formulation 12/27/1999, 04/29/2000   ??? Hepatitis B, Adult 03/05/2013, 04/05/2013, 09/03/2013   ??? INFLUENZA TIV (TRI) PF (IM) 10/08/2011   ??? Influenza Vaccine Quad (IIV4 PF) 47mo+ injectable 03/05/2013, 03/18/2014, 03/22/2015, 03/05/2016, 03/19/2017, 02/23/2018   ??? Influenza Virus Vaccine, unspecified formulation 03/19/2017, 02/23/2018   ??? PNEUMOCOCCAL POLYSACCHARIDE 23 12/30/2012   ??? PPD Test 10/28/2016   ??? TdaP 07/18/2008, 07/16/2013   ??? Tetanus and diptheria,(adult), adsorbed, 2Lf tetanus toxoid, PF 04/29/2000       I have reviewed and (if needed) updated the patient's problem list, medications, allergies, past medical and surgical  history, social and family history.    ROS:     General: no fatigue, excess weight loss or gain, overall feels well  ENT: denies ear symptoms, thoat symptoms, nasal congestion,   Cardiovascular: denies chest pain, palpitations, tachycardia  Respiratory: denies, dyspnea, dyspnea on exertion, orthopnea, wheezing, cough  Gastrointestinal: denies nausea, vomiting, dyspepsia.  No abdominal pain, chronic constipation or diarrhea, melena, hematochezia,   Genitourinary: denies urinary difficulties, erectile dysfunction  Musculoskeletal: denies significant joint pains, muscle pain or weakness  Integumentary: denies rashes  or other skin problems  Neurological: denies headaches, dizziness, numbness, tingling, syncope  Psychological: denies symptoms suggesting depression, anxiety, sleep disturbance    Vital Signs:     Wt Readings from Last 3 Encounters:   04/22/18 (!) 223.7 kg (493 lb 1.6 oz)   04/07/18 (!) 222.6 kg (490 lb 12.8 oz)   03/26/18 (!) 221.6 kg (488 lb 8 oz)     Temp Readings from Last 3 Encounters:   04/22/18 37.1 ??C (98.7 ??F) (Temporal)   06/30/17 36.4 ??C (97.6 ??F) (Oral)   06/23/17 36.8 ??C (98.3 ??F) (Oral)     BP Readings from Last 3 Encounters:   04/22/18 148/66   04/07/18 128/88   03/26/18 132/70     Pulse Readings from Last 3 Encounters:   04/22/18 64   04/07/18 87   03/26/18 59     Estimated body mass index is 70.75 kg/m?? as calculated from the following:    Height as of 04/22/18: 177.8 cm (5' 10).    Weight as of 04/22/18: 223.7 kg (493 lb 1.6 oz).  No height and weight on file for this encounter.        Objective:      General Appearance: Alert, cooperative, no distress, appears stated age.   EYES: PERRL, conjunctiva/corneas clear, EOM's intact, fundi  benign, both eyes  ENT:  External canals clear, Tympanic membrane pearly grey with normal light reflex bilaterally. No oropharyngeal lesions, mucous membranes moist.   NECK: No carotid bruits.  No palpable cervical or supraclavicular lymphadenopathy. Thyroid smooth, normal size  CV: Regular rate and rhythm. Normal S1 and S2. No murmurs, gallops, or rubs  RESP: Normal respiratory effort.  Clear to auscultation bilaterally without wheezes, rhonchi or crackles.   GI: Normal abdominal bowel sounds. Soft, non-tender and non-distended.   ZO:XWRUEA external male genitalia, no testicular masses, no hernias. Prostate smooth, symmetric, normal size, no nodules.Normal rectal tone, heme negative stool. No external hemorrhoids.  EXT:  No lower extremity edema. Posterior tibial pulses and dorsalis pedis pulses are 2+ and symmetric.    MSK: Full upright posture, smooth and symmetric gait. All major joints show full range of motion without discomfort. Strength is 5/5 in the upper and lower extremities.   SKIN: No rashes or suspicious focal lesions noted.   LYMPH NODES: Cervical, supraclavicular, and axillary nodes normal  NEURO: Cranial nerves II- XII grossly intact.  No focal neurologic deficits  PSYCHIATRIC: Alert and oriented x 3. Mood normal.     Labs:       No results found for this visit on 05/12/18.

## 2018-05-13 ENCOUNTER — Telehealth: Payer: Self-pay

## 2018-05-13 ENCOUNTER — Ambulatory Visit: Payer: No Typology Code available for payment source | Admitting: Podiatry

## 2018-05-13 NOTE — Telephone Encounter (Signed)
-----   Message from Elinor ParkinsonMax T Hyatt, [place]PM sent at 05/13/2018  8:11 AM EST ----- Refer to PT for eval and tx of Plantar fasciitis

## 2018-05-13 NOTE — Telephone Encounter (Signed)
I spoke with the patient and informed him of Dr. Geryl RankinsHyatt's recommendation.  He stated that he is already taking PT for back and LE and they have been working on his plantar fasciitis as well and it is not helping.  He would like to wait and discuss other options with Dr. Al CorpusHyatt tomorrow at his office visit

## 2018-05-14 ENCOUNTER — Ambulatory Visit: Payer: Medicaid Other | Admitting: Podiatry

## 2018-05-14 ENCOUNTER — Encounter: Payer: Self-pay | Admitting: Podiatry

## 2018-05-14 DIAGNOSIS — M722 Plantar fascial fibromatosis: Secondary | ICD-10-CM | POA: Diagnosis not present

## 2018-05-14 NOTE — Progress Notes (Signed)
He presents today for follow-up of his MRI.  He states that his bilateral feet are still painful his right foot is the most painful.  Objective: Vital signs are stable he is alert and oriented x3 his MRI does state that he has plantar fasciitis of the bilateral foot.  Pulses are palpable.  States that his last hemoglobin A1c was below 7-1/2.  MRI demonstrates plantar fasciitis bilateral.  Plan: We discussed etiology pathology conservative surgical therapies at this point due to his BMI he is going to have to be done in the hospital I am going to refer him to Dr. Logan BoresEvans so Dr. Logan BoresEvans can meet with him and determine whether or not he would like to take on this case.  I did reinject his right heel today with 20 mg Kenalog 5 mg Marcaine point maximal tenderness.  Tolerated procedure well.  We will follow-up with Dr. Logan BoresEvans in the near future.

## 2018-05-16 ENCOUNTER — Encounter: Payer: Self-pay | Admitting: Physical Therapy

## 2018-05-18 ENCOUNTER — Ambulatory Visit: Payer: Medicaid Other | Admitting: Podiatry

## 2018-05-18 ENCOUNTER — Encounter: Payer: Self-pay | Admitting: Podiatry

## 2018-05-18 DIAGNOSIS — M79675 Pain in left toe(s): Secondary | ICD-10-CM | POA: Diagnosis not present

## 2018-05-18 DIAGNOSIS — M79674 Pain in right toe(s): Secondary | ICD-10-CM

## 2018-05-18 DIAGNOSIS — M79676 Pain in unspecified toe(s): Principal | ICD-10-CM

## 2018-05-18 DIAGNOSIS — B351 Tinea unguium: Secondary | ICD-10-CM

## 2018-05-18 MED ORDER — ERENUMAB-AOOE 70 MG/ML SUBCUTANEOUS AUTO-INJECTOR
SUBCUTANEOUS | 3 refills | 0 days | Status: CP
Start: 2018-05-18 — End: 2018-08-20

## 2018-05-18 NOTE — Progress Notes (Signed)
Complaint:  Visit Type: Patient returns to my office for continued preventative foot care services. Complaint: Patient states" my nails have grown long and thick and become painful to walk and wear shoes" Patient has been diagnosed with DM with no foot complications. The patient presents for preventative foot care services. No changes to ROS  Podiatric Exam: Vascular: dorsalis pedis and posterior tibial pulses are palpable bilateral. Capillary return is immediate. Temperature gradient is WNL. Skin turgor WNL  Sensorium: Normal Semmes Weinstein monofilament test. Normal tactile sensation bilaterally. Nail Exam: Pt has thick disfigured discolored nails with subungual debris noted bilateral entire nail hallux through fifth toenails Ulcer Exam: There is no evidence of ulcer or pre-ulcerative changes or infection. Orthopedic Exam: Muscle tone and strength are WNL. No limitations in general ROM. No crepitus or effusions noted. Foot type and digits show no abnormalities. Bony prominences are unremarkable. Skin: No Porokeratosis. No infection or ulcers  Diagnosis:  Onychomycosis, , Pain in right toe, pain in left toes  Treatment & Plan Procedures and Treatment: Consent by patient was obtained for treatment procedures.   Debridement of mycotic and hypertrophic toenails, 1 through 5 bilateral and clearing of subungual debris. No ulceration, no infection noted.  Return Visit-Office Procedure: Patient instructed to return to the office for a follow up visit 3 months for continued evaluation and treatment.    Jakye Mullens DPM 

## 2018-05-19 ENCOUNTER — Ambulatory Visit: Payer: Medicaid Other | Admitting: Physical Therapy

## 2018-05-19 ENCOUNTER — Encounter: Payer: Self-pay | Admitting: Physical Therapy

## 2018-05-19 DIAGNOSIS — M6281 Muscle weakness (generalized): Secondary | ICD-10-CM | POA: Diagnosis present

## 2018-05-19 DIAGNOSIS — M545 Low back pain, unspecified: Secondary | ICD-10-CM

## 2018-05-19 DIAGNOSIS — R2689 Other abnormalities of gait and mobility: Secondary | ICD-10-CM | POA: Diagnosis present

## 2018-05-20 MED ORDER — OXYBUTYNIN CHLORIDE 5 MG TABLET
ORAL_TABLET | Freq: Two times a day (BID) | ORAL | 3 refills | 0 days | Status: CP
Start: 2018-05-20 — End: ?

## 2018-05-20 NOTE — Unmapped (Signed)
[facility] Refill and Clinical Coordination Note  Medication(s): Humira    Logan Quinn, DOB: January 12, 1981  Phone: [phone] (home) , Alternate phone contact: N/A  Shipping address: [address]  [place]  [address]  Phone or address changes today?: No  All above HIPAA information verified.  Insurance changes? No    Completed refill and clinical call assessment today to schedule patient's medication shipment from the [facility] [phone]).      MEDICATION RECONCILIATION    Confirmed the medication and dosage are correct and have not changed: Yes, regimen is correct and unchanged.    Were there any changes to your medication(s) in the past month:  No, there are no changes reported at this time.    ADHERENCE    Is this medicine transplant or covered by Medicare Part B? No.        Did you miss any doses in the past 4 weeks? No missed doses reported.  Adherence counseling provided? Not needed     SIDE EFFECT MANAGEMENT    Are you tolerating your medication?:  Logan Quinn reports tolerating the medication.  Side effect management discussed: None      Therapy is appropriate and should be continued.    Evidence of clinical benefit: Do you feel that that the medication is helping? Yes      FINANCIAL/SHIPPING    Delivery Scheduled: Yes, Expected medication delivery date: 05/29/18     Medication will be delivered via UPS to the home address in [facility].    Additional medications refilled: No additional medications/refills needed at this time.    The patient will receive a drug information handout for each medication shipped and additional FDA Medication Guides as required.      Yue did not have any additional questions at this time.    Delivery address confirmed in Epic.     We will follow up with patient monthly for standard refill processing and delivery.      Thank you,  Keisha Amer Vangie Bicker   [facility] Specialty Pharmacist

## 2018-05-20 NOTE — Unmapped (Signed)
Pt called requesting refill on his oxybutynin, per office notes from pts visit April 22, 2018, pt informed of risk with taking bentyl and oxybutynin together. Was given # 90 5 mg BID. Spoke to pt, He has had no SE, has had good results. Feels that his bladder is emptying well, Wishes to continue with medication.   Medication refilled to pharmacy on file.  Elvina Sidle, LPN

## 2018-05-21 MED ORDER — GLIPIZIDE 10 MG TABLET
ORAL_TABLET | Freq: Two times a day (BID) | ORAL | 1 refills | 0.00000 days | Status: CP
Start: 2018-05-21 — End: 2019-05-22
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
## 2018-05-22 NOTE — Unmapped (Signed)
Patient called [facility] and stated that he takes his Humira Injections on Thursday, so he would like to request for his Humira Medication to be delivered out to him on Wednesday 05/27/2018. Updated WAM. BP

## 2018-05-26 ENCOUNTER — Encounter: Payer: Medicaid Other | Admitting: Physical Therapy

## 2018-05-26 MED FILL — HUMIRA PEN CITRATE FREE 40 MG/0.4 ML: SUBCUTANEOUS | 28 days supply | Qty: 4 | Fill #0

## 2018-05-26 MED FILL — HUMIRA PEN CITRATE FREE 40 MG/0.4 ML: 28 days supply | Qty: 4 | Fill #0 | Status: AC

## 2018-05-28 ENCOUNTER — Ambulatory Visit: Payer: Medicaid Other | Admitting: Physical Therapy

## 2018-05-28 ENCOUNTER — Encounter: Payer: Self-pay | Admitting: Physical Therapy

## 2018-05-28 DIAGNOSIS — M6281 Muscle weakness (generalized): Secondary | ICD-10-CM

## 2018-05-28 DIAGNOSIS — M545 Low back pain, unspecified: Secondary | ICD-10-CM

## 2018-05-28 DIAGNOSIS — R2689 Other abnormalities of gait and mobility: Secondary | ICD-10-CM

## 2018-05-29 NOTE — Therapy (Addendum)
[facility] [address] [place], [place], [address] Phone: [phone]   Fax:  [phone]  Physical Therapy Treatment  Patient Details  Name: Jason Davidson MRN: [id number] Date of Birth: [birth year] Referring Provider (PT): Verita Lamb, FNP   Encounter Date: 05/28/2018  PT End of Session - 05/31/18 2101    Visit Number  7    Number of Visits  9    Date for PT Re-Evaluation  06/16/18    PT Start Time  0911    PT Stop Time  1001    PT Time Calculation (min)  50 min    Activity Tolerance  Patient tolerated treatment well;Patient limited by pain    Behavior During Therapy  [facility] for tasks assessed/performed       Past Medical History:  Diagnosis Date  . Asthma   . Depressed   . Diabetes mellitus without complication ([place])   . IBS (irritable bowel syndrome)   . Morbid obesity ([place])     Past Surgical History:  Procedure Laterality Date  . EYE SURGERY      There were no vitals filed for this visit.    Pt. reports high levels of pain at rest (8/10) in low back (generalized). Pt. entered PT clinic with heavy use of SPC and antalgic gait pattern.         TREATMENT   Therapeutic Exercise:  Seated Marches 2x15 each leg Seated LAQ 2x15 each leg  Hooklying trunk rotations5x30 sec each side Supine SAQ/ partial bolster bridging with core activation 10x2. (limited technique). Reviewed/ discussed HEP  Manual tx.:  Supine hamstring/ gastroc stretches (poor flexibility/ tolerance to stretches) Supine knee to chest/ hip IR and ER manual stretches  E-stim:  Seated IFC to lumbar paraspinals: bipolar set-up with line powered unit at 40% scan as tolerated 20 min. During use of MH.  Pt. Reports decrease pain symptoms and PT discussed looking into getting unit.  PT determined that pts. Insurance will not pay for home TENS unit and the most cost effective way of getting a unit is from [place] or [employer].      PT Education - 05/31/18 2100    Education Details  PT educated pt. on use of TENS for pain mgmt. with household/ work-related tasks.      Person(s) Educated  Patient    Methods  Explanation;Demonstration    Comprehension  Verbalized understanding;Returned demonstration       PT Short Term Goals - 05/22/18 1549      PT SHORT TERM GOAL #1   Title  Pt will be independent with HEP in order to improve strength and decrease back pain in order to improve pain-free function at home and work.     Baseline  pt. continues to require cuing/ instruct in proper HEP/ progression     Time  4    Period  Weeks    Status  Not Met    Target Date  06/16/18        PT Long Term Goals - 05/22/18 1550      PT LONG TERM GOAL #1   Title  Pt. will complete FOTO and improve to 49 to improve daily functional mobility.    Baseline  FOTO 28 on 03/23/18; FOTO 31 on 04/14/18; 26 on 12/10 (regression noted).     Time  4    Period  Weeks    Status  Not Met    Target Date  06/16/18  PT LONG TERM GOAL #2   Baseline  03/23/18 Worst Pain: 9/10; 04/14/18 Worst Pain: 8/10.  No improvement in pain.     Time  4    Period  Weeks    Status  Partially Met    Target Date  06/16/18      PT LONG TERM GOAL #3   Title  Pt will increase strength of B Hip by at least 1/2 MMT grade in order to demonstrate improvement in strength and function.    Baseline  Hip Flex, abd, add, R/L: 4/4 with pain    Time  4    Period  Weeks    Status  Partially Met    Target Date  06/16/18      PT LONG TERM GOAL #4   Title  Pt will decrease 5TSTS by at least 3 seconds in order to demonstrate clinically significant improvement in LE strength    Baseline  03/23/18 5TSTS: 44.15 sec; 04/14/18 5TSTS: 47.38 sec.; 12/10 is 32.76 sec.     Time  8    Period  Weeks    Status  Achieved    Target Date  05/19/18         Plan - 05/31/18 2102    Clinical Impression Statement  Pt. pain limited with all aspects of supine ther.ex./ manual  tx.  Poor flexibility in B hamstrings/ gastrocs with manual stretches.  Limited knee to chest/ hip stretches secondary to abdominal obesity.  Pt. reports decrease pain with use of IFC to low back in seated posture.  PT called medical company [employer] to check on status of home TENS and pts. Medicaid will not pay for TENS.  Pt. advised to purchase an over the counter TENS through [employer] and PT will educate on proper set-up/use.  No changes to HEP at this time.  PT recommends pt. continue with HEP at this time.  Pt. is scheduled for plantar fascia surgery soon which will limit overall mobility/ wt. bearing/ walking .     Clinical Presentation  Evolving    Clinical Decision Making  Moderate    Rehab Potential  Fair    PT Frequency  1x / week    PT Duration  4 weeks    PT Treatment/Interventions  ADLs/Self Care Home Management;Aquatic Therapy;Canalith Repostioning;Electrical Stimulation;Moist Heat;Iontophoresis 68m/ml Dexamethasone;Traction;Gait training;Stair training;Functional mobility training;Therapeutic activities;Therapeutic exercise;Balance training;Neuromuscular re-education;Patient/family education;Orthotic Fit/Training;Manual techniques;Taping;Energy conservation;Dry needling;Passive range of motion;Spinal Manipulations;Joint Manipulations    PT Next Visit Plan  Progress HEP    PT Home Exercise Plan  see notes       Patient will benefit from skilled therapeutic intervention in order to improve the following deficits and impairments:  Abnormal gait, Decreased endurance, Hypomobility, Obesity, Decreased activity tolerance, Decreased strength, Pain, Decreased mobility, Difficulty walking, Improper body mechanics, Decreased range of motion, Impaired flexibility, Postural dysfunction  Visit Diagnosis: Lumbar pain  Muscle weakness (generalized)  Antalgic gait     Problem List Patient Active Problem List   Diagnosis Date Noted  . Varicose veins of both lower extremities with pain 10/16/2017   . IBS (irritable bowel syndrome) 06/18/2017  . Chronic venous insufficiency 12/31/2016  . Lymphedema 12/31/2016  . Leg pain 12/31/2016  . Diabetes ([place] 12/31/2016  . GERD (gastroesophageal reflux disease) 12/31/2016  . 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 H/O retinal detachment 10/01/2016  . No diabetic retinopathy in both eyes 10/01/2016  . Cellulitis of right lower extremity 06/19/2016  . Chronic joint pain 11/16/2015  .  Skin infection 05/15/2015  . Depressed   . Tick bite 10/20/2014  . Primary insomnia 07/08/2014  . Candidal balanitis 02/16/2014  . Skin tag 01/17/2014  . Left shoulder pain 10/04/2013  . Anxiety 09/13/2013  . Diarrhea 09/13/2013  . Bleeding external hemorrhoids 09/03/2013  . NAFLD (nonalcoholic fatty liver disease) 08/12/2013  . Headache 07/05/2013  . Abdominal pain 03/05/2013  . Morbid (severe) obesity due to excess calories ([place]) 03/05/2013  . Suicidal ideation 02/09/2013  . Obstructive sleep apnea 07/21/2012  . 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   Pura Spice, PT, DPT # [phone] 05/31/2018, 9:10 PM  [facility] [address] [place], [place], [address] Phone: [phone]   Fax:  [phone]  Name: Jason Davidson MRN: [id number] Date of Birth: 1980-12-09

## 2018-06-01 ENCOUNTER — Ambulatory Visit: Payer: Medicaid Other | Admitting: Physical Therapy

## 2018-06-02 ENCOUNTER — Encounter: Payer: Medicaid Other | Admitting: Physical Therapy

## 2018-06-02 NOTE — Unmapped (Signed)
Recent:   What is the date of your last related visit?  05/29/2018  Related acute medications Rx'd:  N/A  Home treatment tried:  N/A    Relevant:   Allergies: N/A  Medications: N/A  Health History: N/A   Weight: N/A

## 2018-06-02 NOTE — Unmapped (Signed)
Reason for Disposition  ??? Fever > 101 F (38.3 C)    Answer Assessment - Initial Assessment Questions  1. DIARRHEA SEVERITY: How bad is the diarrhea? How many extra stools have you had in the past 24 hours than normal?     - NO DIARRHEA (SCALE 0)    - MILD (SCALE 1-3): Few loose or mushy BMs; increase of 1-3 stools over normal daily number of stools; mild increase in ostomy output.    -  MODERATE (SCALE 4-7): Increase of 4-6 stools daily over normal; moderate increase in ostomy output.  * SEVERE (SCALE 8-10; OR 'WORST POSSIBLE'): Increase of 7 or more stools daily over normal; moderate increase in ostomy output; incontinence.      Diarrhea started on Friday evening.  Continuing through today  2. ONSET: When did the diarrhea begin?       Friday night  3. BM CONSISTENCY: How loose or watery is the diarrhea?       Watery - has not kept track but has had 6 watery stools today  4. VOMITING: Are you also vomiting? If so, ask: How many times in the past 24 hours?       Nauseated but no vomiting.  5. ABDOMINAL PAIN: Are you having any abdominal pain? If yes: What does it feel like? (e.g., crampy, dull, intermittent, constant)       Slight abdominal pain; able to walk upright without difficulty.  6. ABDOMINAL PAIN SEVERITY: If present, ask: How bad is the pain?  (e.g., Scale 1-10; mild, moderate, or severe)    - MILD (1-3): doesn't interfere with normal activities, abdomen soft and not tender to touch     - MODERATE (4-7): interferes with normal activities or awakens from sleep, tender to touch     - SEVERE (8-10): excruciating pain, doubled over, unable to do any normal activities        No abdominal pain right now  7. ORAL INTAKE: If vomiting, Have you been able to drink liquids? How much fluids have you had in the past 24 hours?      Drinking plenty of fluids but moth is constantly dry.     HYDRATION: Any signs of dehydration? (e.g., dry mouth [not just dry lips], too weak to stand, dizziness, new weight loss) When did you last urinate?      Urinating often.  Clear urine.  Concerned that he needs IV fluids due to the dry mouth  9. EXPOSURE: Have you traveled to a foreign country recently? Have you been exposed to anyone with diarrhea? Could you have eaten any food that was spoiled?      Has not been out of the country in the past month  10. ANTIBIOTIC USE: Are you taking antibiotics now or have you taken antibiotics in the past 2 months?        Currently on clindamycin - ordered on Friday from dentist in advance of tooth extraction  11. OTHER SYMPTOMS: Do you have any other symptoms? (e.g., fever, blood in stool)        Fever this morning was 100.2, axillary.  Has had a fever off and on for the past 3 days.  Does not know why he is havng a fever.    Overall, just feels weak and wrung out.  12. PREGNANCY: Is there any chance you are pregnant? When was your last menstrual period?        no    Protocols used: DIARRHEA-A-AH

## 2018-06-09 ENCOUNTER — Ambulatory Visit: Payer: Medicaid Other | Admitting: Podiatry

## 2018-06-12 ENCOUNTER — Encounter: Payer: Self-pay | Admitting: Physical Therapy

## 2018-06-14 MED ORDER — PROMETHAZINE 25 MG TABLET
Freq: Every evening | ORAL | 2 refills | 0 days | Status: CP | PRN
Start: 2018-06-14 — End: 2019-06-15

## 2018-06-14 MED ORDER — DICLOFENAC SODIUM 50 MG TABLET,DELAYED RELEASE
Freq: Two times a day (BID) | ORAL | 2 refills | 0.00000 days | Status: CP | PRN
Start: 2018-06-14 — End: 2019-06-15

## 2018-06-14 MED ORDER — MONTELUKAST 10 MG TABLET
Freq: Every day | ORAL | 1 refills | 0 days | Status: CP
Start: 2018-06-14 — End: 2019-01-06

## 2018-06-15 NOTE — Unmapped (Signed)
P/C requesting refill on Phenergan and Diclofenac , last ordered on 02/11/18 and 03/11/18, last visit 04/07/18.

## 2018-06-17 ENCOUNTER — Encounter: Payer: Self-pay | Admitting: Podiatry

## 2018-06-17 MED ORDER — ADVAIR DISKUS 500 MCG-50 MCG/DOSE POWDER FOR INHALATION
Freq: Two times a day (BID) | RESPIRATORY_TRACT | 1 refills | 0.00000 days
Start: 2018-06-17 — End: 2019-01-06

## 2018-06-18 MED ORDER — PROAIR HFA 90 MCG/ACTUATION AEROSOL INHALER
RESPIRATORY_TRACT | 1 refills | 0 days | Status: CP | PRN
Start: 2018-06-18 — End: 2019-01-06

## 2018-06-18 NOTE — Unmapped (Signed)
P/C requesting refill on Tylenol #3 , last ordered on 10/20/17, last visit 04/07/18. Controlled Substance Contract signed on 01/05/18. UDS obtained on 01/05/18 and was neg.

## 2018-06-18 NOTE — Unmapped (Signed)
[facility] Refill Coordination Note    Specialty Medication(s) to be Shipped:   Inflammatory Disorders: Humira       Logan Quinn, DOB: 16-Dec-1980  Phone: [phone] (home)       All above HIPAA information was verified with patient.     Completed refill call assessment today to schedule patient's medication shipment from the [facility] [phone]).       Specialty medication(s) and dose(s) confirmed: Regimen is correct and unchanged.   Changes to medications: Logan Quinn reports no changes reported at this time.  Changes to insurance: No  Questions for the pharmacist: No    The patient will receive a drug information handout for each medication shipped and additional FDA Medication Guides as required.      DISEASE/MEDICATION-SPECIFIC INFORMATION        For patients on injectable medications: Patient currently has none doses left.  Next injection is scheduled for 06/25/2018.    ADHERENCE              MEDICARE PART B DOCUMENTATION     Humira pen 40mg /0.4 ml: Patient has none on hand.    SHIPPING     Shipping address confirmed in Epic.     Delivery Scheduled: Yes, Expected medication delivery date: 06/19/2018 via UPS or courier.     Medication will be delivered via Same Day Courier to the home address in [place].    Logan Quinn   [facility] Specialty Technician

## 2018-06-19 MED FILL — HUMIRA PEN CITRATE FREE 40 MG/0.4 ML: 28 days supply | Qty: 4 | Fill #1 | Status: AC

## 2018-06-19 MED FILL — HUMIRA PEN CITRATE FREE 40 MG/0.4 ML: SUBCUTANEOUS | 28 days supply | Qty: 4 | Fill #1

## 2018-06-21 ENCOUNTER — Ambulatory Visit
Admission: EM | Admit: 2018-06-21 | Discharge: 2018-06-21 | Disposition: A | Discharge status: Home / Self Care | Payer: Medicaid Other

## 2018-06-21 ENCOUNTER — Encounter: Payer: Self-pay | Admitting: Gynecology

## 2018-06-21 ENCOUNTER — Other Ambulatory Visit: Payer: Self-pay

## 2018-06-21 DIAGNOSIS — K0889 Other specified disorders of teeth and supporting structures: Secondary | ICD-10-CM | POA: Diagnosis not present

## 2018-06-21 MED ORDER — ACETAMINOPHEN 300 MG-CODEINE 30 MG TABLET
ORAL_TABLET | Freq: Four times a day (QID) | ORAL | 0 refills | 0 days | Status: CP | PRN
Start: 2018-06-21 — End: 2018-07-21

## 2018-06-21 MED ORDER — CLINDAMYCIN HCL 150 MG PO CAPS: ORAL_CAPSULE | ORAL | 0 refills | Status: DC

## 2018-06-21 NOTE — Discharge Instructions (Addendum)
Follow the instructions in the included information sheet

## 2018-06-21 NOTE — ED Triage Notes (Signed)
Per patient tooth extraction right lower jaw x 4 days ago. Per patient with jaw pain and question infection.

## 2018-06-21 NOTE — ED Provider Notes (Signed)
[facility]    CSN: [id number] Arrival date & time: 06/21/18  1421     History   Chief Complaint Chief Complaint  Patient presents with  . Dental Pain    HPI Jason Davidson is a 38 y.o. male.   HPI  38-year-old male presents with right lower jaw pain.  He states that he had a tooth extraction about 4 days ago.  He is jaw pain today.  Is a dentist called him and was concerned regarding an infection and asked him to come to urgent care.  Is afebrile.  He said no fever or chills.  Dates that he has been using salt water rinses as prescribed by the dentist.        Past Medical History:  Diagnosis Date  . Asthma   . Depressed   . Diabetes mellitus without complication (HCC)   . IBS (irritable bowel syndrome)   . Morbid obesity [facility](HCC)     Patient Active Problem List   Diagnosis Date Noted  . Varicose veins of both lower extremities with pain 10/16/2017  . IBS (irritable bowel syndrome) 06/18/2017  . Chronic venous insufficiency 12/31/2016  . Lymphedema 12/31/2016  . Leg pain 12/31/2016  . Diabetes (HCC) 12/31/2016  . GERD (gastroesophageal reflux disease) 12/31/2016  . 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 H/O retinal detachment 10/01/2016  . No diabetic retinopathy in both eyes 10/01/2016  . Cellulitis of right lower extremity 06/19/2016  . Chronic joint pain 11/16/2015  . Skin infection 05/15/2015  . Depressed   . Tick bite 10/20/2014  . Primary insomnia 07/08/2014  . Candidal balanitis 02/16/2014  . Skin tag 01/17/2014  . Left shoulder pain 10/04/2013  . Anxiety 09/13/2013  . Diarrhea 09/13/2013  . Bleeding external hemorrhoids 09/03/2013  . NAFLD (nonalcoholic fatty liver disease) 40/98/119103/10/2013  . Headache 07/05/2013  . Abdominal pain 03/05/2013  . Morbid (severe) obesity due to excess calories (HCC) 03/05/2013  . Suicidal ideation 02/09/2013  . Obstructive sleep apnea 07/21/2012  . Lesion of radial nerve  07/10/2010    Past Surgical History:  Procedure Laterality Date  . EYE SURGERY         Home Medications    Prior to Admission medications   Medication Sig Start Date End Date Taking? Authorizing Provider  AIMOVIG 70 MG/ML SOAJ  07/21/17  Yes [provider]  ALPRAZolam Prudy Feeler(XANAX) 0.5 MG tablet Take 0.5 mg by mouth at bedtime as needed for anxiety.   Yes [provider]  amphetamine-dextroamphetamine (ADDERALL) 10 MG tablet Take 10 mg by mouth 2 (two) times daily with a meal.   Yes [provider]  azelastine (ASTELIN) 0.1 % nasal spray Place into the nose.   Yes [provider]  Colchicine (MITIGARE) 0.6 MG CAPS Take 1.2 mg on Day 1 of flare, followed by 0.6 mg one hour later. Day 2 take 0.6 mg BID until flare resolves. 08/01/17  Yes [provider]  cyclobenzaprine (FLEXERIL) 10 MG tablet Take by mouth. 01/12/15  Yes [provider]  cyclobenzaprine (FLEXERIL) 10 MG tablet Take 1 tablet (10 mg total) by mouth 3 (three) times daily as needed. 01/31/18  Yes Joni ReiningSmith, Ronald K, PA-C  dicyclomine (BENTYL) 10 MG capsule Take 10 mg by mouth 3 (three) times daily before meals.   Yes [provider]  diphenoxylate-atropine (LOMOTIL) 2.5-0.025 MG tablet Take by mouth. 01/05/18  Yes [provider]  DULoxetine (CYMBALTA) 60 MG capsule Take 120 mg by mouth  daily.    Yes [provider]  fluticasone (FLONASE) 50 MCG/ACT nasal spray fluticasone propionate 50 mcg/actuation nasal spray,suspension   Yes [provider]  Fluticasone-Salmeterol (ADVAIR) 500-50 MCG/DOSE AEPB Inhale 1 puff into the lungs 2 (two) times daily.   Yes [provider]  gabapentin (NEURONTIN) 100 MG capsule Take 100 mg by mouth 3 (three) times daily.   Yes [provider]  glipiZIDE (GLUCOTROL) 10 MG tablet Take 10 mg by mouth daily before breakfast.   Yes [provider]  glucose blood (PRECISION QID TEST) test strip Use 1  strip 3 times a day with meter 06/26/15  Yes [provider]  hydrocortisone (ANUSOL-HC) 25 MG suppository Place rectally. 01/14/17  Yes [provider]  ibuprofen (ADVIL,MOTRIN) 800 MG tablet Take 1 tablet (800 mg total) by mouth every 8 (eight) hours as needed. 05/01/16  Yes Domenick GongMortenson, Ashley, MD  ibuprofen (ADVIL,MOTRIN) 800 MG tablet Take 1 tablet (800 mg total) by mouth every 8 (eight) hours as needed for moderate pain. 01/31/18  Yes Joni ReiningSmith, Ronald K, PA-C  insulin glargine (LANTUS) 100 UNIT/ML injection Inject into the skin. 07/10/17 07/10/18 Yes [provider]  insulin regular (NOVOLIN R,HUMULIN R) 250 units/2.125mL (100 units/mL) injection Inject into the skin 3 (three) times daily before meals.   Yes [provider]  Insulin Syringe-Needle U-100 (BD INSULIN SYRINGE U/F) 31G X 5/16" 1 ML MISC Use as directed with the Lantus Insulin nightly 08/26/17  Yes [provider]  loperamide (IMODIUM) 2 MG capsule Take by mouth.   Yes [provider]  Melatonin 3 MG TABS Take by mouth. 07/08/14  Yes [provider]  naproxen (NAPROSYN) 500 MG tablet Take by mouth. 12/31/17 01/01/19 Yes [provider]  ondansetron (ZOFRAN) 4 MG tablet Take by mouth. 09/04/17 09/05/18 Yes [provider]  oxyCODONE (OXY IR/ROXICODONE) 5 MG immediate release tablet Take by mouth. 11/28/17  Yes [provider]  oxyCODONE-acetaminophen (PERCOCET) 7.5-325 MG tablet Take 1 tablet by mouth every 6 (six) hours as needed for severe pain. 01/31/18  Yes Joni ReiningSmith, Ronald K, PA-C  pantoprazole (PROTONIX) 40 MG tablet Take 40 mg by mouth daily.   Yes [provider]  polyethylene glycol powder (GLYCOLAX/MIRALAX) powder take 17GM (DISSOLVED IN WATER) by mouth once daily 07/25/17  Yes [provider]  PROAIR HFA 108 [phone](90 Base) MCG/ACT inhaler  07/21/17  Yes [provider]  promethazine (PHENERGAN) 25 MG tablet Take by mouth. 09/04/17 09/05/18  Yes [provider]  propranolol (INNOPRAN XL) 120 MG 24 hr capsule Take 120 mg by mouth at bedtime.   Yes [provider]  ranitidine (ZANTAC) 150 MG tablet  07/14/17  Yes [provider]  Spacer/Aero-Holding Chambers (AEROCHAMBER PLUS) inhaler Use as instructed 05/01/16  Yes Domenick GongMortenson, Ashley, MD  SUMAtriptan (IMITREX) 100 MG tablet Take 100 mg by mouth every 2 (two) hours as needed for migraine. May repeat in 2 hours if headache persists or recurs.   Yes [provider]  topiramate (TOPAMAX) 50 MG tablet Take 50 mg by mouth 2 (two) times daily.   Yes [provider]  traZODone (DESYREL) 100 MG tablet Take by mouth.   Yes [provider]  vancomycin (VANCOCIN) 125 MG capsule Take 125 mg by mouth 4 (four) times daily.   Yes [provider]  clindamycin (CLEOCIN) 150 MG capsule 3 capsules (450 mg) 3 times daily for 7 days 06/21/18   Ovid Curdoemer, William P, PA-C  montelukast (SINGULAIR) 10 MG  tablet Take by mouth. 03/31/17 03/31/18  [provider]    Family History Family History  Problem Relation Age of Onset  . Diabetes Mother   . Diabetes Sister   . Diabetes Brother   . Diabetes Maternal Uncle   . Diabetes Maternal Grandmother   . Heart disease Maternal Grandmother   . Cancer Maternal Grandfather   . COPD Paternal Grandmother   . Cancer Paternal Grandfather   . Varicose Veins Paternal Grandfather     Social History Social History   Tobacco Use  . Smoking status: Never Smoker  . Smokeless tobacco: Never Used  Substance Use Topics  . Alcohol use: No  . Drug use: No     Allergies   Erythromycin; Penicillins; and Sulfa antibiotics   Review of Systems Review of Systems  Constitutional: Positive for activity change and appetite change. Negative for chills, fatigue and fever.  HENT: Positive for dental problem.   All other systems reviewed and are negative.    Physical Exam Triage Vital Signs ED Triage  Vitals  Enc Vitals Group     BP 06/21/18 1513 127/67     Pulse Rate 06/21/18 1513 88     Resp 06/21/18 1513 18     Temp 06/21/18 1513 98.1 F (36.7 C)     Temp Source 06/21/18 1513 Oral     SpO2 06/21/18 1513 98 %     Weight 06/21/18 1509 (!) 469 lb (212.7 kg)     Height 06/21/18 1509 5\' 11"  (1.803 m)     Head Circumference --      Peak Flow --      Pain Score 06/21/18 1509 9     Pain Loc --      Pain Edu? --      Excl. in GC? --    No data found.  Updated Vital Signs BP 127/67 (BP Location: Left Arm)   Pulse 88   Temp 98.1 F (36.7 C) (Oral)   Resp 18   Ht 5\' 11"  (1.803 m)   Wt (!) 469 lb (212.7 kg)   SpO2 98%   BMI 65.41 kg/m   Visual Acuity Right Eye Distance:   Left Eye Distance:   Bilateral Distance:    Right Eye Near:   Left Eye Near:    Bilateral Near:     Physical Exam Vitals signs and nursing note reviewed.  Constitutional:      General: He is not in acute distress.    Appearance: Normal appearance. He is obese. He is not ill-appearing, toxic-appearing or diaphoretic.  HENT:     Head: Normocephalic.     Comments: Patient had a extraction of his rear molar on the lower right.  There is to be daughter at amount of clot present.  He is a tender on the mandible inferior to the tooth.  Not appear any purulence although the patient has had a bad taste.    Nose: Nose normal.     Mouth/Throat:     Mouth: Mucous membranes are moist.   Eyes:     General:        Right eye: No discharge.        Left eye: No discharge.     Conjunctiva/sclera: Conjunctivae normal.  Neck:     Musculoskeletal: Normal range of motion. Muscular tenderness present.  Musculoskeletal: Normal range of motion.  Lymphadenopathy:     Cervical: No cervical adenopathy.  Skin:    General: Skin is warm and dry.  Neurological:     General: No focal deficit present.     Mental Status: He is alert and oriented to person, place, and time.  Psychiatric:        Mood and Affect: Mood normal.         Behavior: Behavior normal.        Thought Content: Thought content normal.        Judgment: Judgment normal.      UC Treatments / Results  Labs (all labs ordered are listed, but only abnormal results are displayed) Labs Reviewed - No data to display  EKG None  Radiology No results found.  Procedures Procedures (including critical care time)  Medications Ordered in UC Medications - No data to display  Initial Impression / Assessment and Plan / UC Course  I have reviewed the triage vital signs and the nursing notes.  Pertinent labs & imaging results that were available during my care of the patient were reviewed by me and considered in my medical decision making (see chart for details).   Patient is status post tooth extraction 4 days ago.  It is possible the patient has a dry socket accounting for his pain.  Because of the foul taste that he has had will start him empirically on clindamycin since he is allergic to penicillin.  Going to see his dentist tomorrow and she will make the decision to continue with the clindamycin or not.  I have encouraged him to continue with the salt water rinses.  For pain Tylenol 500 mg combined with ibuprofen 400 mg every 6 hours   Final Clinical Impressions(s) / UC Diagnoses   Final diagnoses:  Pain, dental     Discharge Instructions     Follow the instructions in the included information sheet   ED Prescriptions    Medication Sig Dispense Auth. Provider   clindamycin (CLEOCIN) 150 MG capsule 3 capsules (450 mg) 3 times daily for 7 days 63 capsule Lutricia Feil, PA-C     Controlled Substance Prescriptions [place] Controlled Substance Registry consulted? Not Applicable   Lutricia Feil, PA-C 06/21/18 1627

## 2018-06-26 ENCOUNTER — Encounter

## 2018-06-26 ENCOUNTER — Ambulatory Visit: Payer: Medicaid Other | Admitting: Podiatry

## 2018-06-27 NOTE — Unmapped (Signed)
PA Tylenol #3 faxed to [facility]

## 2018-07-02 NOTE — Unmapped (Signed)
Assessment and Plan:     Logan Quinn was seen today for diabetes.    Diagnoses and all orders for this visit:    Type 2 diabetes mellitus with hyperglycemia, with long-term current use of insulin (CMS-HCC)  HGB A1c 6.7  DM well controlled.   Continue carb controlled diet and current medication regimen.   -     Albumin/creatinine urine ratio  -     POCT glycosylated hemoglobin (Hb A1C)    Recurrent major depressive disorder, in full remission (CMS-HCC)  Stable on duloxetine. Following with mental health provider.     Pain, dental  Defer management to dentist.     Morbid obesity with BMI of 50.0-59.9, adult (CMS-HCC)  Commended for weight loss. Encouraged ongoing efforts.     Screening for thyroid disorder  -     TSH    Chronic joint pain  Patient under safety contract. Review of NCCSRS shows no evidence of misuse.   -     acetaminophen-codeine (TYLENOL #3) 300-30 mg per tablet; Take 1 tablet by mouth every six (6) hours as needed for pain.    Elevated TSH  -     T3, Free  -     T4, Free        HPI:      Logan Quinn  is here for   Chief Complaint   Patient presents with   ??? Diabetes       Depression: Patient presents for follow-up of depression. PHQ9 score goal  <10.  Depression has customarily not been at goal (complicated by: wife's grandmother passed away before Christmas, patient and his wife separated at Christmas, back together but working on things ). PHQ-9 Score:  . Current symptoms include fatigue. Symptoms have been waxed and waned. Patient denies psychomotor agitation, psychomotor retardation, recurrent thoughts of death, suicidal attempt, suicidal thoughts with specific plan and suicidal thoughts without plan. Previous treatment includes: medication. He complains of the following side effects from the treatment: none.     Diabetes: Patient presents for follow up of diabetes.  A1C goal is <8.  Diabetes has customarily been at goal (complicated by: obesity ).  Current symptoms include: hyperglycemia, hypoglycemia, nausea, polydipsia, polyuria, visual disturbances, vomiting and weight loss. Symptoms have progressed to a point and plateaued. Patient denies foot ulcerations, increased appetite, paresthesia of the feet and visual disturbances. Evaluation to date has included: hemoglobin A1C.  Home sugars: BGs range between 50 and 130. Current treatment: Continued insulin which has been effective.  Doing regular exercise: no.       Patient had dental pain in December and dentist prescribed clindamycin which caused him to have severe diarrhea. He visited UC and was tested for C. Diff with negative result. Antibiotic changed. Patient reports poor appetite for 2-3 weeks. Low blood sugar due to decrease in oral intake. He was only unable to eat jello, soups, Mtn Dew Zero. Tooth pulled at the beginning of January.   Dentist prescribed clindamycin again and patient declined to take it. No alternative antibiotic was prescribed.   He has another tooth that is painful.       Of note he is on doxycycline long term for HS.     Patient has upcoming tendon surgery of right heel by podiatry planned. Surgery consult is scheduled for 07/14/18.        PCMH Components:     Goals     ??? Self- Management Goal  Things to think about to help me reach my goal:     What are you going to do? Increase activity/motivation   How and how much? Clean up and do something constructive   How frequent? 2 x week    Barriers to success? fatigue   Solutions to barriers? Going to sleep earlier          ??? Self- Management Goal        Things to think about to help me reach my goal:     What are you going to do? Continue physical activity    How and how much? Fixing house    How frequent? daily   Barriers to success?    Solutions to barriers?                I have reviewed and addressed the patient???s adherence and response to prescribed medications. I have identified patient barriers to following the proposed medication and treatment plan, and have noted opportunities to optimize healthy behaviors. I have answered the patient???s questions to satisfaction and the patient voices understanding.          Past Medical/Surgical History:     Past Medical History:   Diagnosis Date   ??? Acne    ??? Allergic    ??? Anxiety    ??? Depression    ??? Diabetes mellitus (CMS-HCC)    ??? GERD (gastroesophageal reflux disease)    ??? Gout    ??? Hypertension    ??? IBS (irritable bowel syndrome)    ??? 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 Liver disease    ??? Migraines    ??? Morbid obesity with BMI of 60.0-69.9, adult (CMS-HCC)    ??? 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 Obstructive sleep apnea    ??? OSA on CPAP    ??? Severe obstructive sleep apnea    ??? Trapezius muscle strain 12/07/2013   ??? Urinary incontinence, nocturnal enuresis    ??? Venous insufficiency      Past Surgical History:   Procedure Laterality Date   ??? EYE SURGERY  11/15   ??? PR COLONOSCOPY FLX DX W/COLLJ SPEC WHEN PFRMD N/A 03/24/2013    Procedure: COLONOSCOPY, FLEXIBLE, PROXIMAL TO SPLENIC FLEXURE; DIAGNOSTIC, W/WO COLLECTION SPECIMEN BY BRUSH OR WASH;  Surgeon: Clint Bolder, MD;  [facility];  Service: Gastroenterology   ??? PR EYE SURG POST SGMT PROC UNLISTED Left     pneumatic retinopexy OS   ??? PR UPPER GI ENDOSCOPY,DIAGNOSIS N/A 02/02/2013    Procedure: UGI ENDO, INCLUDE ESOPHAGUS, STOMACH, & DUODENUM &/OR JEJUNUM; DX W/WO COLLECTION SPECIMN, BY BRUSH OR WASH;  Surgeon: Malcolm Metro, MD;  Location: [facility];  Service: Gastroenterology   ??? [place] PYLORIC STENOSIS ([place] HISTORICAL RESULT)         Family History:     Family History   Problem Relation Age of Onset   ??? Cancer Maternal Grandfather    ??? Hearing loss Maternal Grandfather    ??? Cancer Paternal Grandfather    ??? COPD Paternal Grandmother    ??? Arthritis Paternal Grandmother    ??? Depression Paternal Grandmother    ??? Diabetes Mother    ??? Heart disease Mother    ??? Migraines Mother    ??? Arthritis Mother    ??? Depression Mother    ??? GER disease Mother    ??? Hypertension Mother    ??? Angina Mother    ???  COPD Mother    ??? Glaucoma Mother    ??? Diabetes Sister    ??? Migraines Sister    ??? Asthma Sister    ??? Depression Sister    ??? Diabetes Brother    ??? Asthma Brother    ??? Diabetes Maternal Grandmother    ??? Heart disease Maternal Grandmother    ??? Migraines Maternal Grandmother    ??? Depression Maternal Grandmother    ??? Angina Maternal Grandmother    ??? Hypertension Maternal Grandmother    ??? Diabetes Maternal Uncle    ??? Diabetes Maternal Uncle    ??? Liver disease Maternal Uncle    ??? Kidney disease Maternal Uncle    ??? Asthma Brother    ??? Colorectal Cancer Neg Hx    ??? Esophageal cancer Neg Hx    ??? Liver cancer Neg Hx    ??? Pancreatic cancer Neg Hx    ??? Stomach cancer Neg Hx    ??? Glaucoma Neg Hx    ??? Amblyopia Neg Hx    ??? Blindness Neg Hx    ??? Retinal detachment Neg Hx    ??? Strabismus Neg Hx    ??? Macular degeneration Neg Hx    ??? Anesthesia problems Neg Hx    ??? Broken bones Neg Hx    ??? Clotting disorder Neg Hx    ??? Collagen disease Neg Hx    ??? Dislocations Neg Hx    ??? Fibromyalgia Neg Hx    ??? Gout Neg Hx    ??? Hemophilia Neg Hx    ??? Osteoporosis Neg Hx    ??? Rheumatologic disease Neg Hx    ??? Scoliosis Neg Hx    ??? Severe sprains Neg Hx    ??? Sickle cell anemia Neg Hx    ??? Spinal Compression Fracture Neg Hx    ??? Melanoma Neg Hx    ??? Basal cell carcinoma Neg Hx    ??? Squamous cell carcinoma Neg Hx    ??? Deep vein thrombosis Neg Hx        Social History:     Social History     Socioeconomic History   ??? Marital status: Married     Spouse name: Not on file   ??? Number of children: Not on file   ??? Years of education: Not on file   ??? Highest education level: Not on file   Occupational History   ??? Not on file   Social Needs   ??? Financial resource strain: Not on file   ??? Food insecurity:     Worry: Not on file     Inability: Not on file   ??? Transportation needs:     Medical: Not on file     Non-medical: Not on file   Tobacco Use   ??? Smoking status: Never Smoker   ??? Smokeless tobacco: Never Used   Substance and Sexual Activity   ??? Alcohol use: Never     Alcohol/week: 0.0 standard drinks     Comment: rare social   ??? Drug use: Never   ??? Sexual activity: Yes     Partners: Female   Lifestyle   ??? Physical activity:     Days per week: Not on file     Minutes per session: Not on file   ??? Stress: Not on file   Relationships   ??? Social connections:     Talks on phone: Not on file     Gets together: Not  on file     Attends religious service: Not on file     Active member of club or organization: Not on file     Attends meetings of clubs or organizations: Not on file     Relationship status: Not on file   Other Topics Concern   ??? Do you use sunscreen? Yes   ??? Tanning bed use? No   ??? Are you easily burned? No   ??? Excessive sun exposure? No   ??? Blistering sunburns? No   Social History Narrative   ??? Not on file       Allergies:     Erythromycin; Penicillins; Sulfa (sulfonamide antibiotics); Other; Erythromycin base; and Sulfasalazine    Current Medications:     Current Outpatient Medications   Medication Sig Dispense Refill   ??? ACCU-CHEK SOFTCLIX LANCETS lancets TEST three times a day 100 each 3   ??? acetaminophen (TYLENOL) 500 MG tablet Take 2 tablets (1,000 mg total) by mouth Three (3) times a day. 30 tablet 0   ??? acetaminophen-codeine (TYLENOL #3) 300-30 mg per tablet Take 1 tablet by mouth every six (6) hours as needed for pain. 60 tablet 0   ??? acetaminophen-codeine (TYLENOL #3) 300-30 mg per tablet Take 1 tablet by mouth every six (6) hours as needed for pain. 60 tablet 0   ??? adalimumab (HUMIRA,CF, PEN CROHNS-UC-HS) 80 mg/0.8 mL PnKt Inject the contents of 2 pens (160 mg) under the skin on day 1. Then inject the contents of 1 pen (80 mg) on day 15. 1 kit 0   ??? ADALIMUMAB PEN CITRATE FREE 40 MG/0.4 ML Inject the contents of 1 pen (40 mg total) under the skin every seven (7) days. 4 each 5   ??? ADVAIR DISKUS 500-50 mcg/dose diskus Inhale 1 puff 2 (two) times a day. 13 Inhaler 1   ??? AEROCHAMBER PLUS FLOW-VU Spcr Use as directed with your inhaler 1 each 0   ??? albuterol (PROAIR HFA) 90 mcg/actuation inhaler ProAir HFA 90 mcg/actuation aerosol inhaler     ??? albuterol 2.5 mg /3 mL (0.083 %) nebulizer solution Inhale 3 mL (2.5 mg total) by nebulization every four (4) hours as needed for wheezing. 25 vial 2   ??? ALPRAZolam (XANAX) 0.5 MG tablet Take 1 tablet (0.5 mg total) by mouth Three (3) times a day. (Patient taking differently: Take 1 mg by mouth Three (3) times a day. ) 60 tablet 0   ??? blood sugar diagnostic (ACCU-CHEK AVIVA PLUS TEST STRP) Strp by Other route Three (3) times a day before meals. 300 each 1   ??? blood sugar diagnostic Strp Use 1 strip 3 times a day with meter 100 each 11   ??? blood-glucose meter (ACCU-CHEK AVIVA PLUS METER) Misc Accu Chek Aviva Meter Use to test Blood sugar as directed E11.65 1 each 0   ??? busPIRone (BUSPAR) 10 MG tablet take 1 tablet by mouth three times a day if needed for anxiety  0   ??? cetirizine (ZYRTEC) 10 MG tablet Take 1 tablet (10 mg total) by mouth daily. 90 tablet 1   ??? chlorhexidine (PERIDEX) 0.12 % solution 15 mL by Oromucosal route Two (2) times a day. 473 mL 2   ??? CLINDAMYCIN HCL ORAL Take by mouth.     ??? clotrimazole-betamethasone (LOTRISONE) cream   0   ??? cyclobenzaprine (FLEXERIL) 10 MG tablet Take 1 tablet (10 mg total) by mouth Three (3) times a day as needed for muscle spasms. 30 tablet  1   ??? dextroamphetamine-amphetamine (ADDERALL XR) 20 MG 24 hr capsule Take 20 mg by mouth two (2) times a day.     ??? diclofenac (VOLTAREN) 50 MG EC tablet Take 1 tablet (50 mg total) by mouth two (2) times a day as needed. 60 each 2   ??? dicyclomine (BENTYL) 10 mg capsule Take 1 capsule (10 mg total) by mouth Three (3) times a day. 90 capsule 5   ??? diphenoxylate-atropine (LOMOTIL) 2.5-0.025 mg per tablet Take 1 tablet by mouth two (2) times a day as needed for diarrhea. 30 tablet 11   ??? doxycycline (VIBRAMYCIN) 100 MG capsule Take 1 capsule (100 mg total) by mouth Two (2) times a day. 60 capsule 2   ??? DULoxetine (CYMBALTA) 60 MG capsule Take 60 mg by mouth.     ??? empty container Misc Use as directed to dispose of Humira injections 1 each 2   ??? erenumab-aooe (AIMOVIG AUTOINJECTOR) 70 mg/mL AtIn Inject 1 mL under the skin every twenty-eight (28) days. 1 mL 3   ??? fluticasone (FLONASE) 50 mcg/actuation nasal spray 1 spray each nare daily 16 g 5   ??? gabapentin (NEURONTIN) 300 MG capsule Take 300 mg by mouth Three (3) times a day.     ??? glipiZIDE (GLUCOTROL) 10 MG tablet Take 2 tablets (20 mg total) by mouth Two (2) times a day (30 minutes before a meal). 360 tablet 1   ??? hydrocortisone (ANUSOL-HC) 25 mg suppository Insert 1 suppository (25 mg total) into the rectum Two (2) times a day. 24 each 1   ??? hydrocortisone (PROCTOSOL HC) 2.5 % rectal cream Insert 1 application into the rectum two (2) times a day as needed. 28.35 g 2   ??? ibuprofen (ADVIL,MOTRIN) 800 MG tablet Take 1 tablet (800 mg total) by mouth every eight (8) hours as needed. 90 tablet 1   ??? insulin glargine (LANTUS U-100 INSULIN) 100 unit/mL injection Inject 0.8 mL (80 Units total) under the skin nightly. 72 mL 1   ??? insulin regular (HUMULIN R REGULAR U-100 INSULN) 100 unit/mL injection SLIDING SCALE BS 0-150= 0 UNITS 150-200= 3 UNITS, 200-250= 6 UNITS, 250-300= 9 UNITS, 300-350= 12 UNITS 40 mL 1   ??? insulin syringe-needle U-100 (BD INSULIN SYRINGE ULTRA-FINE) 1 mL 30 gauge x 1/2 Syrg USE AS DIRECTED. WITH LANTUS 100 each 1   ??? lancets (ACCU-CHEK SOFTCLIX LANCETS) Misc Inject 1 each under the skin Three (3) times a day. 300 each 11   ??? lidocaine (XYLOCAINE) 5 % ointment Apply to painful areas in the groin TID as needed for pain 120 g 3   ??? loperamide (IMODIUM) 2 mg capsule Take 2 mg by mouth 4 (four) times a day as needed for diarrhea.     ??? MITIGARE 0.6 mg cap capsule Take 1.2 mg on Day 1 of flare, followed by 0.6 mg one hour later. Day 2 take 0.6 mg BID until flare resolves. 60 capsule 3   ??? montelukast (SINGULAIR) 10 mg tablet Take 1 tablet (10 mg total) by mouth daily. 90 each 1   ??? naproxen (NAPROSYN) 500 MG tablet Take 1 tablet (500 mg total) by mouth 2 (two) times a day with meals. 60 each 2   ??? nystatin (MYCOSTATIN) 100,000 unit/gram powder Apply to affected area 3 times daily 60 g 3   ??? ondansetron (ZOFRAN) 4 MG tablet Take 1-2 tablets (4-8 mg total) by mouth every twelve (12) hours as needed for nausea. 30 each 2   ???  oxybutynin (DITROPAN) 5 MG tablet Take 1 tablet (5 mg total) by mouth Two (2) times a day. 90 tablet 3   ??? oxyCODONE (ROXICODONE) 5 MG immediate release tablet Take 1 tablet (5 mg total) by mouth every four (4) hours as needed for pain. for up to 12 doses 12 tablet 0   ??? pantoprazole (PROTONIX) 40 MG tablet Take 1 tablet (40 mg total) by mouth daily at 0600. 90 tablet 3   ??? PROAIR HFA 90 mcg/actuation inhaler Inhale 2 puffs every four (4) hours as needed for wheezing. 3 Inhaler 1   ??? promethazine (PHENERGAN) 25 MG tablet Take 1-2 tablets (25-50 mg total) by mouth nightly as needed for nausea. 30 each 2   ??? propranolol (INDERAL LA) 120 mg 24 hr capsule Take 1 capsule (120 mg total) by mouth daily. 90 capsule 1   ??? ranitidine (ZANTAC) 150 MG tablet Take 1 tablet (150 mg total) by mouth Two (2) times a day. 180 tablet 3   ??? topiramate (TOPAMAX) 25 MG tablet Take 1 tablet (25 mg total) by mouth Two (2) times a day. 180 tablet 1   ??? topiramate (TOPAMAX) 50 MG tablet Take 1 tablet (50 mg total) by mouth Two (2) times a day. 180 tablet 1   ??? traZODone (DESYREL) 100 MG tablet take 2 tablets by mouth at bedtime  0     No current facility-administered medications for this visit.        Health Maintenance:     Health Maintenance Summary w/Most Recent Date       Status Date      Urine Albumin/Creatinine Ratio Next Due 06/30/2018      Done 06/30/2017 Registry Metric: [facility] DM AMB LAST URINE MICROALBUMIN TO CREATININE RATIO     Done 06/30/2017 ALBUMIN / CREATININE URINE RATIO Albumin/Creatinine Ratio           Done 05/24/2016 ALBUMIN / CREATININE URINE RATIO Albumin/Creatinine Ratio           Done 03/24/2015 ALBUMIN / CREATININE URINE RATIO Albumin/Creatinine Ratio           Done 04/05/2013 MICROALBUMIN / CREATININE URINE RATIO Microalbumin/Creatinine Ratio          Hemoglobin A1c Next Due 07/08/2018      Done 01/05/2018 Registry Metric: Last Hemoglobin A1c Date     Done 01/05/2018 POCT GLYCOSYLATED HEMOGLOBIN (HGB A1C) HGB A1C, RAP/PDS           Done 10/06/2017 POCT GLYCOSYLATED HEMOGLOBIN (HGB A1C) HGB A1C, RAP/PDS           Done 06/30/2017 POCT GLYCOSYLATED HEMOGLOBIN (HGB A1C) HGB A1C, RAP/PDS           Done 03/19/2017 POCT GLYCOSYLATED HEMOGLOBIN (HGB A1C) HGB A1C, RAP/PDS           Patient has more history with this topic...    Retinal Eye Exam Next Due 09/18/2018      Done 09/17/2017 SmartData: OPHTH FUNDUS OD PERIPHERY     Done 09/17/2017 SmartData: OPHTH FUNDUS OS PERIPHERY     Done 09/17/2017 SmartData: FINDINGS - PE - EYES - FUNDUSCOPIC - PERIPHERY - RIGHT PERIPHERY NORMAL     Done 09/30/2016 SmartData: OPHTH FUNDUS OD PERIPHERY     Done 09/30/2016 SmartData: OPHTH FUNDUS OS PERIPHERY     Patient has more history with this topic...    Foot Exam Next Due 10/07/2018      Done 10/06/2017 [facility] DIABETES FOOT EXAM     Done 09/13/2016  [facility] DIABETES FOOT EXAM     Done 08/07/2015 [facility] DIABETES FOOT EXAM    Serum Creatinine Monitoring Next Due 04/08/2019      Done 04/07/2018 COMPREHENSIVE METABOLIC PANEL Creatinine           Done 04/20/2017 BASIC METABOLIC PANEL Creatinine           Done 11/10/2016 COMPREHENSIVE METABOLIC PANEL Creatinine           Done 06/23/2016 BASIC METABOLIC PANEL Creatinine           Done 06/22/2016 BASIC METABOLIC PANEL Creatinine           Patient has more history with this topic...    Potassium Monitoring Next Due 04/08/2019      Done 04/07/2018 COMPREHENSIVE METABOLIC PANEL Potassium           Done 04/20/2017 BASIC METABOLIC PANEL Potassium           Done 11/10/2016 COMPREHENSIVE METABOLIC PANEL Potassium           Done 06/23/2016 BASIC METABOLIC PANEL Potassium           Done 06/22/2016 BASIC METABOLIC PANEL Potassium           Patient has more history with this topic...    DTaP/Tdap/Td Vaccines Next Due 07/17/2023      Done 07/16/2013 Imm Admin: TdaP     Done 07/18/2008 Imm Admin: TdaP     Done 04/29/2000 Imm Admin: Tetanus and diptheria,(adult), adsorbed, 2Lf tetanus toxoid, PF    Pneumococcal Vaccine This plan is no longer active.      Done 12/30/2012 Imm Admin: PNEUMOCOCCAL POLYSACCHARIDE 23    Influenza Vaccine This plan is no longer active.      Done 02/23/2018 Imm Admin: Influenza Vaccine Quad (IIV4 PF) 79mo+ injectable     Done 02/23/2018 Imm Admin: Influenza Virus Vaccine, unspecified formulation     Done 03/19/2017 Imm Admin: Influenza Vaccine Quad (IIV4 PF) 36mo+ injectable     Done 03/19/2017 Imm Admin: Influenza Virus Vaccine, unspecified formulation     Done 03/05/2016 Imm Admin: Influenza Vaccine Quad (IIV4 PF) 72mo+ injectable     Patient has more history with this topic...          Immunizations:     Immunization History   Administered Date(s) Administered   ??? Hepatitis B Vaccine, Unspecified Formulation 12/27/1999, 04/29/2000   ??? Hepatitis B, Adult 03/05/2013, 04/05/2013, 09/03/2013   ??? INFLUENZA TIV (TRI) PF (IM) 10/08/2011   ??? Influenza Vaccine Quad (IIV4 PF) 77mo+ injectable 03/05/2013, 03/18/2014, 03/22/2015, 03/05/2016, 03/19/2017, 02/23/2018   ??? Influenza Virus Vaccine, unspecified formulation 03/19/2017, 02/23/2018   ??? PNEUMOCOCCAL POLYSACCHARIDE 23 12/30/2012   ??? PPD Test 10/28/2016   ??? TdaP 07/18/2008, 07/16/2013   ??? Tetanus and diptheria,(adult), adsorbed, 2Lf tetanus toxoid, PF 04/29/2000       I have reviewed and (if needed) updated the patient's problem list, medications, allergies, past medical and surgical history, social and family history.    ROS:      ROS  Comprehensive 10 point ROS negative unless otherwise stated in the HPI.        Answers for HPI/ROS submitted by the patient on 07/07/2018   Diabetes problem  Diabetes type: type 2 MedicAlert ID: No  Disease duration: 4 years  fatigue: No  foot ulcerations: No  polyphagia: No  polyuria: Yes  visual change: No  Symptom course: improving  confusion: No  hunger: No  mood changes: No  pallor: No  sleepiness:  No  speech difficulty: No  sweats: No  blackouts: No  hospitalization: No  nocturnal hypoglycemia: No  required assistance: No  required glucagon: No  CVA: No  heart disease: No  impotence: Yes  nephropathy: No  peripheral neuropathy: No  PVD: No  retinopathy: No  CAD risks: family history, obesity, sedentary lifestyle  Current treatments: insulin injections, oral agent (monotherapy)  Treatment compliance: all of the time  Dose schedule: pre-breakfast, pre-lunch, pre-dinner, at bedtime  Given by: patient  Injection sites: abdominal wall, arms, thighs  Home blood tests: 3-4 x per day  Home urines: <1 x per month  Monitoring compliance: adequate  Blood glucose trend: decreasing steadily  breakfast glucose level: 70-90  lunch time: 12-1 pm  lunch glucose level: 90-110  dinner time: 6-7 pm  dinner glucose level: 110-130  High score: 140-180  Overall: 110-130  Weight trend: decreasing steadily  Current diet: diabetic, low fat/cholesterol  Meal planning: ADA exchanges, avoidance of concentrated sweets, calorie counting  Exercise: intermittently  Dietitian visit: No  Eye exam current: Yes  Sees podiatrist: Yes       Vital Signs:     Wt Readings from Last 3 Encounters:   07/08/18 (!) 215.8 kg (475 lb 12.8 oz)   04/22/18 (!) 223.7 kg (493 lb 1.6 oz)   04/07/18 (!) 222.6 kg (490 lb 12.8 oz)     Temp Readings from Last 3 Encounters:   04/22/18 37.1 ??C (98.7 ??F) (Temporal)   06/30/17 36.4 ??C (97.6 ??F) (Oral)   06/23/17 36.8 ??C (98.3 ??F) (Oral)     BP Readings from Last 3 Encounters:   07/08/18 120/88   04/22/18 148/66   04/07/18 128/88     Pulse Readings from Last 3 Encounters:   07/08/18 73   04/22/18 64   04/07/18 87     Estimated body mass index is 70.75 kg/m?? as calculated from the following: Height as of 04/22/18: 177.8 cm (5' 10).    Weight as of 04/22/18: 223.7 kg (493 lb 1.6 oz).  No height and weight on file for this encounter.        Objective:      General: Alert and oriented x3. Well-appearing. No acute distress.   HEENT:  Normocephalic.  Atraumatic. Conjunctiva and sclera normal. OP MMM without lesions.   Neck:  Supple. No thyroid enlargement. No adenopathy.   Heart:  Regular rate and rhythm . Normal S1, S2.  No murmurs, rubs or gallops.   Lungs:  No respiratory distress.  Lungs clear to auscultation. No wheezes, rhonchi, or rales.   GI/GU:  Soft, obese, +BS, nondistended, non-TTP. No palpable masses or organomegaly.   Extremities:  No edema. Peripheral pulses normal.   Skin:  Warm, dry. No rash or lesions present.   Neuro:  Non-focal. No obvious weakness.   Psych:  Affect normal, eye contact good, speech clear and coherent.

## 2018-07-08 ENCOUNTER — Ambulatory Visit: Admit: 2018-07-08 | Discharge: 2018-07-09 | Payer: MEDICAID | Attending: Family | Primary: Family

## 2018-07-08 DIAGNOSIS — Z6841 Body Mass Index (BMI) 40.0 and over, adult: Secondary | ICD-10-CM

## 2018-07-08 DIAGNOSIS — R7989 Other specified abnormal findings of blood chemistry: Secondary | ICD-10-CM

## 2018-07-08 DIAGNOSIS — F3342 Major depressive disorder, recurrent, in full remission: Secondary | ICD-10-CM

## 2018-07-08 DIAGNOSIS — Z794 Long term (current) use of insulin: Secondary | ICD-10-CM

## 2018-07-08 DIAGNOSIS — G8929 Other chronic pain: Secondary | ICD-10-CM

## 2018-07-08 DIAGNOSIS — K0889 Other specified disorders of teeth and supporting structures: Secondary | ICD-10-CM

## 2018-07-08 DIAGNOSIS — Z1329 Encounter for screening for other suspected endocrine disorder: Secondary | ICD-10-CM

## 2018-07-08 DIAGNOSIS — M2550 Pain in unspecified joint: Secondary | ICD-10-CM

## 2018-07-08 DIAGNOSIS — E1165 Type 2 diabetes mellitus with hyperglycemia: Principal | ICD-10-CM

## 2018-07-08 LAB — ALBUMIN/CREATININE RATIO: Albumin/Creatinine:MRto:Pt:Urine:Qn:: 0

## 2018-07-08 LAB — THYROID STIMULATING HORMONE: Thyrotropin:ACnc:Pt:Ser/Plas:Qn:: 7.03 — ABNORMAL HIGH

## 2018-07-08 LAB — ALBUMIN / CREATININE URINE RATIO: CREATININE, URINE: 194.7 mg/dL

## 2018-07-09 LAB — FREE T4: Thyroxine.free:MCnc:Pt:Ser/Plas:Qn:: 0.94

## 2018-07-09 LAB — T3 FREE: Triiodothyronine.free:MCnc:Pt:Ser/Plas:Qn:: 3.76

## 2018-07-09 NOTE — Unmapped (Deleted)
Assessment and Plan:     There are no diagnoses linked to this encounter.     Barriers to goals identified and addressed. Pertinent handouts were given today and reviewed with the patient as indicated.  The Care Plan and Self-Management goals have been included on the AVS and the AVS has been printed.   I encouraged the patient to keep regular logs for me to review at their next visit. Any outside resources or referrals needed at this time are noted above. Patient's current medications have been reviewed. Any new medications prescribed have been discussed, and side effects have been addressed.  Have assessed the patient's understanding, respsonse, and barriers to adherence to medications.Patient voiced understanding and all questions have been answered to satisfaction.     Subjective:      Logan Quinn is a 38 y.o. male being seen for a comprehensive physical exam.        No chief complaint on file.      Lifestyle:  Employment:  Exercise:   Diet:   Caffeine Use:  Last Dentist:  Last Vision:    PHQ-2 Score:       ASCVD risk:  The ASCVD Risk score Denman George DC Jr., et al., 2013) failed to calculate for the following reasons:    The 2013 ASCVD risk score is only valid for ages 30 to 61    Note: For patients with SBP <90 or >200, Total Cholesterol <130 or >320, HDL <20 or >100 which are outside of the allowable range, the calculator will use these upper or lower values to calculate the patient???s risk score.       Social History:     Social History     Socioeconomic History   ??? Marital status: Married     Spouse name: Not on file   ??? Number of children: Not on file   ??? Years of education: Not on file   ??? Highest education level: Not on file   Occupational History   ??? Not on file   Social Needs   ??? Financial resource strain: Not on file   ??? Food insecurity     Worry: Not on file     Inability: Not on file   ??? Transportation needs     Medical: Not on file     Non-medical: Not on file   Tobacco Use   ??? Smoking status: Never Smoker   ??? Smokeless tobacco: Never Used   Substance and Sexual Activity   ??? Alcohol use: Never     Alcohol/week: 0.0 standard drinks     Comment: rare social   ??? Drug use: Never   ??? Sexual activity: Yes     Partners: Female   Lifestyle   ??? Physical activity     Days per week: Not on file     Minutes per session: Not on file   ??? Stress: Not on file   Relationships   ??? Social Wellsite geologist on phone: Not on file     Gets together: Not on file     Attends religious service: Not on file     Active member of club or organization: Not on file     Attends meetings of clubs or organizations: Not on file     Relationship status: Not on file   Other Topics Concern   ??? Do you use sunscreen? Yes   ??? Tanning bed use? No   ??? Are you  easily burned? No   ??? Excessive sun exposure? No   ??? Blistering sunburns? No   Social History Narrative   ??? Not on file       Past Medical/Surgical History:     Past Medical History:   Diagnosis Date   ??? Acne    ??? Allergic    ??? Anxiety    ??? Depression    ??? Diabetes mellitus (CMS-HCC)    ??? GERD (gastroesophageal reflux disease)    ??? Gout    ??? Hypertension    ??? IBS (irritable bowel syndrome)    ??? 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 Liver disease    ??? Migraines    ??? Morbid obesity with BMI of 60.0-69.9, adult (CMS-HCC)    ??? 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 Obstructive sleep apnea    ??? OSA on CPAP    ??? Severe obstructive sleep apnea    ??? Trapezius muscle strain 12/07/2013   ??? Urinary incontinence, nocturnal enuresis    ??? Venous insufficiency      Past Surgical History:   Procedure Laterality Date   ??? EYE SURGERY  11/15   ??? PR COLONOSCOPY FLX DX W/COLLJ SPEC WHEN PFRMD N/A 03/24/2013    Procedure: COLONOSCOPY, FLEXIBLE, PROXIMAL TO SPLENIC FLEXURE; DIAGNOSTIC, W/WO COLLECTION SPECIMEN BY BRUSH OR WASH;  Surgeon: Clint Bolder, MD;  [facility];  Service: Gastroenterology   ??? PR EYE SURG POST SGMT PROC UNLISTED Left     pneumatic retinopexy OS   ??? PR UPPER GI ENDOSCOPY,DIAGNOSIS N/A 02/02/2013    Procedure: UGI ENDO, INCLUDE ESOPHAGUS, STOMACH, & DUODENUM &/OR JEJUNUM; DX W/WO COLLECTION SPECIMN, BY BRUSH OR WASH;  Surgeon: Malcolm Metro, MD;  Location: [facility];  Service: Gastroenterology   ??? [place] PYLORIC STENOSIS ( HISTORICAL RESULT)         Family History:     Family History   Problem Relation Age of Onset   ??? Cancer Maternal Grandfather    ??? Hearing loss Maternal Grandfather    ??? Cancer Paternal Grandfather    ??? COPD Paternal Grandmother    ??? Arthritis Paternal Grandmother    ??? Depression Paternal Grandmother    ??? Diabetes Mother    ??? Heart disease Mother    ??? Migraines Mother    ??? Arthritis Mother    ??? Depression Mother    ??? GER disease Mother    ??? Hypertension Mother    ??? Angina Mother    ??? COPD Mother    ??? Glaucoma Mother    ??? Diabetes Sister    ??? Migraines Sister    ??? Asthma Sister    ??? Depression Sister    ??? Diabetes Brother    ??? Asthma Brother    ??? Diabetes Maternal Grandmother    ??? Heart disease Maternal Grandmother    ??? Migraines Maternal Grandmother    ??? Depression Maternal Grandmother    ??? Angina Maternal Grandmother    ??? Hypertension Maternal Grandmother    ??? Diabetes Maternal Uncle    ??? Diabetes Maternal Uncle    ??? Liver disease Maternal Uncle    ??? Kidney disease Maternal Uncle    ??? Asthma Brother    ??? Colorectal Cancer Neg Hx    ??? Esophageal cancer Neg Hx    ??? Liver cancer Neg Hx    ??? Pancreatic cancer Neg Hx    ??? Stomach cancer Neg  Hx    ??? Glaucoma Neg Hx    ??? Amblyopia Neg Hx    ??? Blindness Neg Hx    ??? Retinal detachment Neg Hx    ??? Strabismus Neg Hx    ??? Macular degeneration Neg Hx    ??? Anesthesia problems Neg Hx    ??? Broken bones Neg Hx    ??? Clotting disorder Neg Hx    ??? Collagen disease Neg Hx    ??? Dislocations Neg Hx    ??? Fibromyalgia Neg Hx    ??? Gout Neg Hx    ??? Hemophilia Neg Hx    ??? Osteoporosis Neg Hx    ??? Rheumatologic disease Neg Hx    ??? Scoliosis Neg Hx    ??? Severe sprains Neg Hx    ??? Sickle cell anemia Neg Hx    ??? Spinal Compression Fracture Neg Hx    ??? Melanoma Neg Hx    ??? Basal cell carcinoma Neg Hx    ??? Squamous cell carcinoma Neg Hx    ??? Deep vein thrombosis Neg Hx        Allergies:     Erythromycin; Penicillins; Sulfa (sulfonamide antibiotics); Other; Erythromycin base; and Sulfasalazine    Current Medications:     Current Outpatient Medications   Medication Sig Dispense Refill   ??? ACCU-CHEK SOFTCLIX LANCETS lancets TEST three times a day 100 each 3   ??? acetaminophen (TYLENOL) 500 MG tablet Take 2 tablets (1,000 mg total) by mouth Three (3) times a day. 30 tablet 0   ??? acetaminophen-codeine (TYLENOL #3) 300-30 mg per tablet Take 1 tablet by mouth every six (6) hours as needed for pain. 60 tablet 0   ??? [START ON 07/23/2018] acetaminophen-codeine (TYLENOL #3) 300-30 mg per tablet Take 1 tablet by mouth every six (6) hours as needed for pain. 60 tablet 0   ??? adalimumab (HUMIRA,CF, PEN CROHNS-UC-HS) 80 mg/0.8 mL PnKt Inject the contents of 2 pens (160 mg) under the skin on day 1. Then inject the contents of 1 pen (80 mg) on day 15. 1 kit 0   ??? HUMIRA PEN CITRATE FREE 40 MG/0.4 ML Inject the contents of 1 pen (40 mg total) under the skin every seven (7) days. 4 each 5   ??? ADVAIR DISKUS 500-50 mcg/dose diskus Inhale 1 puff 2 (two) times a day. 13 Inhaler 1   ??? AEROCHAMBER PLUS FLOW-VU Spcr Use as directed with your inhaler 1 each 0   ??? albuterol (PROAIR HFA) 90 mcg/actuation inhaler ProAir HFA 90 mcg/actuation aerosol inhaler     ??? albuterol 2.5 mg /3 mL (0.083 %) nebulizer solution Inhale 3 mL (2.5 mg total) by nebulization every four (4) hours as needed for wheezing. 25 vial 2   ??? ALPRAZolam (XANAX) 0.5 MG tablet Take 1 tablet (0.5 mg total) by mouth Three (3) times a day. (Patient taking differently: Take 1 mg by mouth Three (3) times a day. ) 60 tablet 0   ??? blood sugar diagnostic (ACCU-CHEK AVIVA PLUS TEST STRP) Strp by Other route Three (3) times a day before meals. 300 each 1   ??? blood sugar diagnostic Strp Use 1 strip 3 times a day with meter 100 each 11   ??? blood-glucose meter (ACCU-CHEK AVIVA PLUS METER) Misc Accu Chek Aviva Meter Use to test Blood sugar as directed E11.65 1 each 0   ??? busPIRone (BUSPAR) 10 MG tablet take 1 tablet by mouth three times a day if needed for  anxiety  0   ??? cetirizine (ZYRTEC) 10 MG tablet Take 1 tablet (10 mg total) by mouth daily. 90 tablet 1   ??? chlorhexidine (PERIDEX) 0.12 % solution 15 mL by Oromucosal route Two (2) times a day. 473 mL 2   ??? CLINDAMYCIN HCL ORAL Take by mouth.     ??? clotrimazole-betamethasone (LOTRISONE) cream   0   ??? cyclobenzaprine (FLEXERIL) 10 MG tablet Take 1 tablet (10 mg total) by mouth Three (3) times a day as needed for muscle spasms. 30 tablet 1   ??? dextroamphetamine-amphetamine (ADDERALL XR) 20 MG 24 hr capsule Take 20 mg by mouth two (2) times a day.     ??? diclofenac (VOLTAREN) 50 MG EC tablet Take 1 tablet (50 mg total) by mouth two (2) times a day as needed. 60 each 2   ??? dicyclomine (BENTYL) 10 mg capsule Take 1 capsule (10 mg total) by mouth Three (3) times a day. 90 capsule 5   ??? diphenoxylate-atropine (LOMOTIL) 2.5-0.025 mg per tablet Take 1 tablet by mouth two (2) times a day as needed for diarrhea. 30 tablet 11   ??? doxycycline (VIBRAMYCIN) 100 MG capsule Take 1 capsule (100 mg total) by mouth Two (2) times a day. 60 capsule 2   ??? DULoxetine (CYMBALTA) 60 MG capsule Take 60 mg by mouth.     ??? empty container Misc Use as directed to dispose of Humira injections 1 each 2   ??? erenumab-aooe (AIMOVIG AUTOINJECTOR) 70 mg/mL AtIn Inject 1 mL under the skin every twenty-eight (28) days. 1 mL 3   ??? fluticasone (FLONASE) 50 mcg/actuation nasal spray 1 spray each nare daily 16 g 5   ??? gabapentin (NEURONTIN) 300 MG capsule Take 300 mg by mouth Three (3) times a day.     ??? glipiZIDE (GLUCOTROL) 10 MG tablet Take 2 tablets (20 mg total) by mouth Two (2) times a day (30 minutes before a meal). 360 tablet 1   ??? hydrocortisone (ANUSOL-HC) 25 mg suppository Insert 1 suppository (25 mg total) into the rectum Two (2) times a day. 24 each 1   ??? hydrocortisone (PROCTOSOL HC) 2.5 % rectal cream Insert 1 application into the rectum two (2) times a day as needed. 28.35 g 2   ??? ibuprofen (ADVIL,MOTRIN) 800 MG tablet Take 1 tablet (800 mg total) by mouth every eight (8) hours as needed. 90 tablet 1   ??? insulin glargine (LANTUS U-100 INSULIN) 100 unit/mL injection Inject 0.8 mL (80 Units total) under the skin nightly. 72 mL 1   ??? insulin regular (HUMULIN R REGULAR U-100 INSULN) 100 unit/mL injection SLIDING SCALE BS 0-150= 0 UNITS 150-200= 3 UNITS, 200-250= 6 UNITS, 250-300= 9 UNITS, 300-350= 12 UNITS 40 mL 1   ??? insulin syringe-needle U-100 (BD INSULIN SYRINGE ULTRA-FINE) 1 mL 30 gauge x 1/2 Syrg USE AS DIRECTED. WITH LANTUS 100 each 1   ??? lancets (ACCU-CHEK SOFTCLIX LANCETS) Misc Inject 1 each under the skin Three (3) times a day. 300 each 11   ??? lidocaine (XYLOCAINE) 5 % ointment Apply to painful areas in the groin TID as needed for pain 120 g 3   ??? loperamide (IMODIUM) 2 mg capsule Take 2 mg by mouth 4 (four) times a day as needed for diarrhea.     ??? MITIGARE 0.6 mg cap capsule Take 1.2 mg on Day 1 of flare, followed by 0.6 mg one hour later. Day 2 take 0.6 mg BID until flare resolves. 60 capsule 3   ???  montelukast (SINGULAIR) 10 mg tablet Take 1 tablet (10 mg total) by mouth daily. 90 each 1   ??? naproxen (NAPROSYN) 500 MG tablet Take 1 tablet (500 mg total) by mouth 2 (two) times a day with meals. 60 each 2   ??? nystatin (MYCOSTATIN) 100,000 unit/gram powder Apply to affected area 3 times daily 60 g 3   ??? ondansetron (ZOFRAN) 4 MG tablet Take 1-2 tablets (4-8 mg total) by mouth every twelve (12) hours as needed for nausea. 30 each 2   ??? oxybutynin (DITROPAN) 5 MG tablet Take 1 tablet (5 mg total) by mouth Two (2) times a day. 90 tablet 3   ??? oxyCODONE (ROXICODONE) 5 MG immediate release tablet Take 1 tablet (5 mg total) by mouth every four (4) hours as needed for pain. for up to 12 doses 12 tablet 0   ??? pantoprazole (PROTONIX) 40 MG tablet Take 1 tablet (40 mg total) by mouth daily at 0600. 90 tablet 3   ??? PROAIR HFA 90 mcg/actuation inhaler Inhale 2 puffs every four (4) hours as needed for wheezing. 3 Inhaler 1   ??? promethazine (PHENERGAN) 25 MG tablet Take 1-2 tablets (25-50 mg total) by mouth nightly as needed for nausea. 30 each 2   ??? propranolol (INDERAL LA) 120 mg 24 hr capsule Take 1 capsule (120 mg total) by mouth daily. 90 capsule 1   ??? ranitidine (ZANTAC) 150 MG tablet Take 1 tablet (150 mg total) by mouth Two (2) times a day. 180 tablet 3   ??? topiramate (TOPAMAX) 25 MG tablet Take 1 tablet (25 mg total) by mouth Two (2) times a day. 180 tablet 1   ??? topiramate (TOPAMAX) 50 MG tablet Take 1 tablet (50 mg total) by mouth Two (2) times a day. 180 tablet 1   ??? traZODone (DESYREL) 100 MG tablet take 2 tablets by mouth at bedtime  0     No current facility-administered medications for this visit.        Health Maintenance:     Health Maintenance Summary w/Most Recent Date       Status Date      Retinal Eye Exam Next Due 09/18/2018      Done 09/17/2017 SmartData: OPHTH FUNDUS OD PERIPHERY     Done 09/17/2017 SmartData: OPHTH FUNDUS OS PERIPHERY     Done 09/17/2017 SmartData: FINDINGS - PE - EYES - FUNDUSCOPIC - PERIPHERY - RIGHT PERIPHERY NORMAL     Done 09/30/2016 SmartData: OPHTH FUNDUS OD PERIPHERY     Done 09/30/2016 SmartData: OPHTH FUNDUS OS PERIPHERY     Patient has more history with this topic...    Foot Exam Next Due 10/07/2018      Done 10/06/2017 [facility] DIABETES FOOT EXAM     Done 09/13/2016 [facility] DIABETES FOOT EXAM     Done 08/07/2015 [facility] DIABETES FOOT EXAM    Hemoglobin A1c Next Due 01/06/2019      Done 07/08/2018 Registry Metric: Last Hemoglobin A1c Date     Done 07/08/2018 POCT GLYCOSYLATED HEMOGLOBIN (HGB A1C) HGB A1C, RAP/PDS           Done 01/05/2018 POCT GLYCOSYLATED HEMOGLOBIN (HGB A1C) HGB A1C, RAP/PDS           Done 10/06/2017 POCT GLYCOSYLATED HEMOGLOBIN (HGB A1C) HGB A1C, RAP/PDS           Done 06/30/2017 POCT GLYCOSYLATED HEMOGLOBIN (HGB A1C) HGB A1C, RAP/PDS           Patient has more history with  this topic...    Serum Creatinine Monitoring Next Due 04/08/2019      Done 04/07/2018 COMPREHENSIVE METABOLIC PANEL Creatinine           Done 04/20/2017 BASIC METABOLIC PANEL Creatinine           Done 11/10/2016 COMPREHENSIVE METABOLIC PANEL Creatinine           Done 06/23/2016 BASIC METABOLIC PANEL Creatinine           Done 06/22/2016 BASIC METABOLIC PANEL Creatinine           Patient has more history with this topic...    Potassium Monitoring Next Due 04/08/2019      Done 04/07/2018 COMPREHENSIVE METABOLIC PANEL Potassium           Done 04/20/2017 BASIC METABOLIC PANEL Potassium           Done 11/10/2016 COMPREHENSIVE METABOLIC PANEL Potassium           Done 06/23/2016 BASIC METABOLIC PANEL Potassium           Done 06/22/2016 BASIC METABOLIC PANEL Potassium           Patient has more history with this topic...    Urine Albumin/Creatinine Ratio Next Due 07/09/2019      Done 07/08/2018 Registry Metric: [facility] DM AMB LAST URINE MICROALBUMIN TO CREATININE RATIO     Done 07/08/2018 ALBUMIN / CREATININE URINE RATIO Albumin/Creatinine Ratio           Done 06/30/2017 ALBUMIN / CREATININE URINE RATIO Albumin/Creatinine Ratio           Done 05/24/2016 ALBUMIN / CREATININE URINE RATIO Albumin/Creatinine Ratio           Done 03/24/2015 ALBUMIN / CREATININE URINE RATIO Albumin/Creatinine Ratio           Patient has more history with this topic...    DTaP/Tdap/Td Vaccines Next Due 07/17/2023      Done 07/16/2013 Imm Admin: TdaP     Done 07/18/2008 Imm Admin: TdaP     Done 04/29/2000 Imm Admin: Tetanus and diptheria,(adult), adsorbed, 2Lf tetanus toxoid, PF    Pneumococcal Vaccine This plan is no longer active.      Done 12/30/2012 Imm Admin: PNEUMOCOCCAL POLYSACCHARIDE 23    Influenza Vaccine This plan is no longer active.      Done 02/23/2018 Imm Admin: Influenza Vaccine Quad (IIV4 PF) 19mo+ injectable     Done 02/23/2018 Imm Admin: Influenza Virus Vaccine, unspecified formulation     Done 03/19/2017 Imm Admin: Influenza Vaccine Quad (IIV4 PF) 25mo+ injectable     Done 03/19/2017 Imm Admin: Influenza Virus Vaccine, unspecified formulation     Done 03/05/2016 Imm Admin: Influenza Vaccine Quad (IIV4 PF) 77mo+ injectable     Patient has more history with this topic...          Immunizations:     Immunization History   Administered Date(s) Administered   ??? Hepatitis B Vaccine, Unspecified Formulation 12/27/1999, 04/29/2000   ??? Hepatitis B, Adult 03/05/2013, 04/05/2013, 09/03/2013   ??? INFLUENZA TIV (TRI) PF (IM) 10/08/2011   ??? Influenza Vaccine Quad (IIV4 PF) 35mo+ injectable 03/05/2013, 03/18/2014, 03/22/2015, 03/05/2016, 03/19/2017, 02/23/2018   ??? Influenza Virus Vaccine, unspecified formulation 03/19/2017, 02/23/2018   ??? PNEUMOCOCCAL POLYSACCHARIDE 23 12/30/2012   ??? PPD Test 10/28/2016   ??? TdaP 07/18/2008, 07/16/2013   ??? Tetanus and diptheria,(adult), adsorbed, 2Lf tetanus toxoid, PF 04/29/2000       I have reviewed and (if needed) updated the patient's problem list,  medications, allergies, past medical and surgical history, social and family history.    ROS:     General: no fatigue, excess weight loss or gain, overall feels well  ENT: denies ear symptoms, thoat symptoms, nasal congestion,   Cardiovascular: denies chest pain, palpitations, tachycardia  Respiratory: denies, dyspnea, dyspnea on exertion, orthopnea, wheezing, cough  Gastrointestinal: denies nausea, vomiting, dyspepsia.  No abdominal pain, chronic constipation or diarrhea, melena, hematochezia,   Genitourinary: denies urinary difficulties, erectile dysfunction  Musculoskeletal: denies significant joint pains, muscle pain or weakness  Integumentary: denies rashes  or other skin problems  Neurological: denies headaches, dizziness, numbness, tingling, syncope  Psychological: denies symptoms suggesting depression, anxiety, sleep disturbance    Vital Signs:     Wt Readings from Last 3 Encounters: 07/08/18 (!) 215.8 kg (475 lb 12.8 oz)   04/22/18 (!) 223.7 kg (493 lb 1.6 oz)   04/07/18 (!) 222.6 kg (490 lb 12.8 oz)     Temp Readings from Last 3 Encounters:   04/22/18 37.1 ??C (98.7 ??F) (Temporal)   06/30/17 36.4 ??C (97.6 ??F) (Oral)   06/23/17 36.8 ??C (98.3 ??F) (Oral)     BP Readings from Last 3 Encounters:   07/08/18 120/88   04/22/18 148/66   04/07/18 128/88     Pulse Readings from Last 3 Encounters:   07/08/18 73   04/22/18 64   04/07/18 87     Estimated body mass index is 68.27 kg/m?? as calculated from the following:    Height as of 04/22/18: 177.8 cm (5' 10).    Weight as of 07/08/18: 215.8 kg (475 lb 12.8 oz).  No height and weight on file for this encounter.        Objective:      General Appearance: Alert, cooperative, no distress, appears stated age.   EYES: PERRL, conjunctiva/corneas clear, EOM's intact, fundi  benign, both eyes  ENT:  External canals clear, Tympanic membrane pearly grey with normal light reflex bilaterally. No oropharyngeal lesions, mucous membranes moist.   NECK: No carotid bruits.  No palpable cervical or supraclavicular lymphadenopathy. Thyroid smooth, normal size  CV: Regular rate and rhythm. Normal S1 and S2. No murmurs, gallops, or rubs  RESP: Normal respiratory effort.  Clear to auscultation bilaterally without wheezes, rhonchi or crackles.   GI: Normal abdominal bowel sounds. Soft, non-tender and non-distended.   ZO:XWRUEA external male genitalia, no testicular masses, no hernias. Prostate smooth, symmetric, normal size, no nodules.Normal rectal tone, heme negative stool. No external hemorrhoids.  EXT:  No lower extremity edema. Posterior tibial pulses and dorsalis pedis pulses are 2+ and symmetric.    MSK: Full upright posture, smooth and symmetric gait. All major joints show full range of motion without discomfort. Strength is 5/5 in the upper and lower extremities.   SKIN: No rashes or suspicious focal lesions noted.   LYMPH NODES: Cervical, supraclavicular, and axillary nodes normal  NEURO: Cranial nerves II- XII grossly intact.  No focal neurologic deficits  PSYCHIATRIC: Alert and oriented x 3. Mood normal.     Labs:       No results found for this visit on 07/13/18.

## 2018-07-09 NOTE — Unmapped (Signed)
[facility] Refill Coordination Note    Specialty Medication(s) to be Shipped:   Inflammatory Disorders: Humira    Other medication(s) to be shipped: n/a     Logan Quinn, DOB: 1981/02/05  Phone: [phone] (home)       All above HIPAA information was verified with patient.     Completed refill call assessment today to schedule patient's medication shipment from the [facility] [phone]).       Specialty medication(s) and dose(s) confirmed: Regimen is correct and unchanged.   Changes to medications: Hashim reports no changes reported at this time.  Changes to insurance: No  Questions for the pharmacist: No    Confirmed patient received Welcome Packet with first shipment. The patient will receive a drug information handout for each medication shipped and additional FDA Medication Guides as required.       DISEASE/MEDICATION-SPECIFIC INFORMATION        patient had humira on hand for dose this week    SPECIALTY MEDICATION ADHERENCE     Medication Adherence    Patient reported X missed doses in the last month:  0  Specialty Medication:  humira  Patient is on additional specialty medications:  No  Patient is on more than two specialty medications:  No  Any gaps in refill history greater than 2 weeks in the last 3 months:  no  Demonstrates understanding of importance of adherence:  yes  Informant:  patient  Reliability of informant:  reliable  Confirmed plan for next specialty medication refill:  delivery by pharmacy  Refills needed for supportive medications:  not needed          Refill Coordination    Has the Patients' Contact Information Changed:  No  Is the Shipping Address Different:  No           humira cf pen 40mg /0.84ml injection: patient has 0 days of medication on hand      SHIPPING     Shipping address confirmed in Epic.     Delivery Scheduled: Yes, Expected medication delivery date: 2/3.     Medication will be delivered via Same Day Courier to the home address in Epic WAM.    Renette Butters   [facility] Specialty Technician

## 2018-07-10 MED ORDER — LEVOTHYROXINE 50 MCG TABLET
ORAL_TABLET | Freq: Every day | ORAL | 1 refills | 0.00000 days | Status: CP
Start: 2018-07-10 — End: 2018-10-05

## 2018-07-13 MED FILL — HUMIRA PEN CITRATE FREE 40 MG/0.4 ML: SUBCUTANEOUS | 28 days supply | Qty: 4 | Fill #2

## 2018-07-13 MED FILL — HUMIRA PEN CITRATE FREE 40 MG/0.4 ML: 28 days supply | Qty: 4 | Fill #2 | Status: AC

## 2018-07-14 ENCOUNTER — Ambulatory Visit: Payer: Medicaid Other | Admitting: Podiatry

## 2018-07-16 NOTE — Unmapped (Signed)
Assessment and Plan:     Logan Quinn was seen today for annual exam.    Diagnoses and all orders for this visit:    Annual physical exam  Discussed healthy behaviors. Counseled regarding portion control, reducing carb and soda intake, daily exercise, wt management, TSE.     Acquired hypothyroidism  -     TSH; Future    Encounter for vitamin deficiency screening  -     Vitamin D 25 Hydroxy (25OH D2 + D3); Future    Dysuria  UA unremarkable. Send for urine cx and STI testing.   -     Urine Culture  -     POCT urinalysis dipstick  -     Chlamydia/Gonorrhoeae NAA         Barriers to goals identified and addressed. Pertinent handouts were given today and reviewed with the patient as indicated.  The Care Plan and Self-Management goals have been included on the AVS and the AVS has been printed.   I encouraged the patient to keep regular logs for me to review at their next visit. Any outside resources or referrals needed at this time are noted above. Patient's current medications have been reviewed. Any new medications prescribed have been discussed, and side effects have been addressed.  Have assessed the patient's understanding, respsonse, and barriers to adherence to medications.Patient voiced understanding and all questions have been answered to satisfaction.     Subjective:      Logan Quinn is a 38 y.o. male being seen for a comprehensive physical exam.        Chief Complaint   Patient presents with   ??? Annual Exam       Lifestyle:  Employment: not employed   Exercise: trying little things, but with back problems it is hard  Diet: chicken, eating more vegetables, fried foods   Caffeine Use: limits caffeine, soda intake includes Sprite, 2 to 3 a day   Last Dentist: January 2020  Last Vision: April 2019    Patient endorses dysuria for 2-3 weeks. He took 4-5 left over pills of Keflex. Symptoms abated but have now returned.   Occasional light pink tinge with wiping after urination.     Upcoming surgery on Friday for heel, right first.     PHQ-2 Score:       ASCVD risk:  The ASCVD Risk score Denman George DC Jr., et al., 2013) failed to calculate for the following reasons:    The 2013 ASCVD risk score is only valid for ages 18 to 62    Note: For patients with SBP <90 or >200, Total Cholesterol <130 or >320, HDL <20 or >100 which are outside of the allowable range, the calculator will use these upper or lower values to calculate the patient???s risk score.       Social History:     Social History     Socioeconomic History   ??? Marital status: Married     Spouse name: Not on file   ??? Number of children: Not on file   ??? Years of education: Not on file   ??? Highest education level: Not on file   Occupational History   ??? Not on file   Social Needs   ??? Financial resource strain: Not on file   ??? Food insecurity     Worry: Not on file     Inability: Not on file   ??? Transportation needs     Medical: Not on file  Non-medical: Not on file   Tobacco Use   ??? Smoking status: Never Smoker   ??? Smokeless tobacco: Never Used   Substance and Sexual Activity   ??? Alcohol use: Never     Alcohol/week: 0.0 standard drinks     Comment: rare social   ??? Drug use: Never   ??? Sexual activity: Yes     Partners: Female   Lifestyle   ??? Physical activity     Days per week: Not on file     Minutes per session: Not on file   ??? Stress: Not on file   Relationships   ??? Social Wellsite geologist on phone: Not on file     Gets together: Not on file     Attends religious service: Not on file     Active member of club or organization: Not on file     Attends meetings of clubs or organizations: Not on file     Relationship status: Not on file   Other Topics Concern   ??? Do you use sunscreen? Yes   ??? Tanning bed use? No   ??? Are you easily burned? No   ??? Excessive sun exposure? No   ??? Blistering sunburns? No   Social History Narrative   ??? Not on file       Past Medical/Surgical History:     Past Medical History:   Diagnosis Date   ??? Acne    ??? Allergic    ??? Anxiety    ??? Depression    ??? Diabetes mellitus (CMS-HCC)    ??? GERD (gastroesophageal reflux disease)    ??? Gout    ??? Hypertension    ??? IBS (irritable bowel syndrome)    ??? 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 Liver disease    ??? Migraines    ??? Morbid obesity with BMI of 60.0-69.9, adult (CMS-HCC)    ??? 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 Obstructive sleep apnea    ??? OSA on CPAP    ??? Severe obstructive sleep apnea    ??? Trapezius muscle strain 12/07/2013   ??? Urinary incontinence, nocturnal enuresis    ??? Venous insufficiency      Past Surgical History:   Procedure Laterality Date   ??? EYE SURGERY  11/15   ??? PR COLONOSCOPY FLX DX W/COLLJ SPEC WHEN PFRMD N/A 03/24/2013    Procedure: COLONOSCOPY, FLEXIBLE, PROXIMAL TO SPLENIC FLEXURE; DIAGNOSTIC, W/WO COLLECTION SPECIMEN BY BRUSH OR WASH;  Surgeon: Clint Bolder, MD;  [facility];  Service: Gastroenterology   ??? PR EYE SURG POST SGMT PROC UNLISTED Left     pneumatic retinopexy OS   ??? PR UPPER GI ENDOSCOPY,DIAGNOSIS N/A 02/02/2013    Procedure: UGI ENDO, INCLUDE ESOPHAGUS, STOMACH, & DUODENUM &/OR JEJUNUM; DX W/WO COLLECTION SPECIMN, BY BRUSH OR WASH;  Surgeon: Malcolm Metro, MD;  [facility]: [facility];  Service: Gastroenterology   ??? [place] PYLORIC STENOSIS ([place] HISTORICAL RESULT)         Family History:     Family History   Problem Relation Age of Onset   ??? Cancer Maternal Grandfather    ??? Hearing loss Maternal Grandfather    ??? Cancer Paternal Grandfather    ??? COPD Paternal Grandmother    ??? Arthritis Paternal Grandmother    ??? Depression Paternal Grandmother    ??? Diabetes Mother    ??? Heart disease Mother    ???  Migraines Mother    ??? Arthritis Mother    ??? Depression Mother    ??? GER disease Mother    ??? Hypertension Mother    ??? Angina Mother    ??? COPD Mother    ??? Glaucoma Mother    ??? Diabetes Sister    ??? Migraines Sister    ??? Asthma Sister    ??? Depression Sister    ??? Diabetes Brother    ??? Asthma Brother    ??? Diabetes Maternal Grandmother    ??? Heart disease Maternal Grandmother    ??? Migraines Maternal Grandmother    ??? Depression Maternal Grandmother    ??? Angina Maternal Grandmother    ??? Hypertension Maternal Grandmother    ??? Diabetes Maternal Uncle    ??? Diabetes Maternal Uncle    ??? Liver disease Maternal Uncle    ??? Kidney disease Maternal Uncle    ??? Asthma Brother    ??? Colorectal Cancer Neg Hx    ??? Esophageal cancer Neg Hx    ??? Liver cancer Neg Hx    ??? Pancreatic cancer Neg Hx    ??? Stomach cancer Neg Hx    ??? Glaucoma Neg Hx    ??? Amblyopia Neg Hx    ??? Blindness Neg Hx    ??? Retinal detachment Neg Hx    ??? Strabismus Neg Hx    ??? Macular degeneration Neg Hx    ??? Anesthesia problems Neg Hx    ??? Broken bones Neg Hx    ??? Clotting disorder Neg Hx    ??? Collagen disease Neg Hx    ??? Dislocations Neg Hx    ??? Fibromyalgia Neg Hx    ??? Gout Neg Hx    ??? Hemophilia Neg Hx    ??? Osteoporosis Neg Hx    ??? Rheumatologic disease Neg Hx    ??? Scoliosis Neg Hx    ??? Severe sprains Neg Hx    ??? Sickle cell anemia Neg Hx    ??? Spinal Compression Fracture Neg Hx    ??? Melanoma Neg Hx    ??? Basal cell carcinoma Neg Hx    ??? Squamous cell carcinoma Neg Hx    ??? Deep vein thrombosis Neg Hx        Allergies:     Erythromycin; Penicillins; Sulfa (sulfonamide antibiotics); Other; Erythromycin base; and Sulfasalazine    Current Medications:     Current Outpatient Medications   Medication Sig Dispense Refill   ??? ACCU-CHEK SOFTCLIX LANCETS lancets TEST three times a day 100 each 3   ??? acetaminophen (TYLENOL) 500 MG tablet Take 2 tablets (1,000 mg total) by mouth Three (3) times a day. 30 tablet 0   ??? acetaminophen-codeine (TYLENOL #3) 300-30 mg per tablet Take 1 tablet by mouth every six (6) hours as needed for pain. 60 tablet 0   ??? [START ON 07/23/2018] acetaminophen-codeine (TYLENOL #3) 300-30 mg per tablet Take 1 tablet by mouth every six (6) hours as needed for pain. 60 tablet 0   ??? adalimumab (HUMIRA,CF, PEN CROHNS-UC-HS) 80 mg/0.8 mL PnKt Inject the contents of 2 pens (160 mg) under the skin on day 1. Then inject the contents of 1 pen (80 mg) on day 15. 1 kit 0   ??? ADVAIR DISKUS 500-50 mcg/dose diskus Inhale 1 puff 2 (two) times a day. 13 Inhaler 1   ??? AEROCHAMBER PLUS FLOW-VU Spcr Use as directed with your inhaler 1 each 0   ??? albuterol (PROAIR  HFA) 90 mcg/actuation inhaler ProAir HFA 90 mcg/actuation aerosol inhaler     ??? albuterol 2.5 mg /3 mL (0.083 %) nebulizer solution Inhale 3 mL (2.5 mg total) by nebulization every four (4) hours as needed for wheezing. 25 vial 2   ??? ALPRAZolam (XANAX) 0.5 MG tablet Take 1 tablet (0.5 mg total) by mouth Three (3) times a day. (Patient taking differently: Take 1 mg by mouth Three (3) times a day. ) 60 tablet 0   ??? blood sugar diagnostic (ACCU-CHEK AVIVA PLUS TEST STRP) Strp by Other route Three (3) times a day before meals. 300 each 1   ??? blood sugar diagnostic Strp Use 1 strip 3 times a day with meter 100 each 11   ??? blood-glucose meter (ACCU-CHEK AVIVA PLUS METER) Misc Accu Chek Aviva Meter Use to test Blood sugar as directed E11.65 1 each 0   ??? cetirizine (ZYRTEC) 10 MG tablet Take 1 tablet (10 mg total) by mouth daily. 90 tablet 1   ??? chlorhexidine (PERIDEX) 0.12 % solution 15 mL by Oromucosal route Two (2) times a day. 473 mL 2   ??? CLINDAMYCIN HCL ORAL Take by mouth.     ??? clotrimazole-betamethasone (LOTRISONE) cream   0   ??? cyclobenzaprine (FLEXERIL) 10 MG tablet Take 1 tablet (10 mg total) by mouth Three (3) times a day as needed for muscle spasms. 30 tablet 1   ??? dextroamphetamine-amphetamine (ADDERALL XR) 20 MG 24 hr capsule Take 20 mg by mouth two (2) times a day.     ??? diclofenac (VOLTAREN) 50 MG EC tablet Take 1 tablet (50 mg total) by mouth two (2) times a day as needed. 60 each 2   ??? dicyclomine (BENTYL) 10 mg capsule Take 1 capsule (10 mg total) by mouth Three (3) times a day. 90 capsule 5   ??? diphenoxylate-atropine (LOMOTIL) 2.5-0.025 mg per tablet Take 1 tablet by mouth two (2) times a day as needed for diarrhea. 30 tablet 11   ??? doxycycline (VIBRAMYCIN) 100 MG capsule Take 1 capsule (100 mg total) by mouth Two (2) times a day. 60 capsule 2   ??? DULoxetine (CYMBALTA) 60 MG capsule Take 60 mg by mouth.     ??? empty container Misc Use as directed to dispose of Humira injections 1 each 2   ??? erenumab-aooe (AIMOVIG AUTOINJECTOR) 70 mg/mL AtIn Inject 1 mL under the skin every twenty-eight (28) days. 1 mL 3   ??? fluticasone (FLONASE) 50 mcg/actuation nasal spray 1 spray each nare daily 16 g 5   ??? gabapentin (NEURONTIN) 300 MG capsule Take 300 mg by mouth Three (3) times a day.     ??? glipiZIDE (GLUCOTROL) 10 MG tablet Take 2 tablets (20 mg total) by mouth Two (2) times a day (30 minutes before a meal). 360 tablet 1   ??? HUMIRA PEN CITRATE FREE 40 MG/0.4 ML Inject the contents of 1 pen (40 mg total) under the skin every seven (7) days. 4 each 5   ??? hydrocortisone (ANUSOL-HC) 25 mg suppository Insert 1 suppository (25 mg total) into the rectum Two (2) times a day. 24 each 1   ??? hydrocortisone (PROCTOSOL HC) 2.5 % rectal cream Insert 1 application into the rectum two (2) times a day as needed. 28.35 g 2   ??? ibuprofen (ADVIL,MOTRIN) 800 MG tablet Take 1 tablet (800 mg total) by mouth every eight (8) hours as needed. 90 tablet 1   ??? insulin glargine (LANTUS U-100 INSULIN) 100 unit/mL injection  Inject 0.8 mL (80 Units total) under the skin nightly. 72 mL 1   ??? insulin regular (HUMULIN R REGULAR U-100 INSULN) 100 unit/mL injection SLIDING SCALE BS 0-150= 0 UNITS 150-200= 3 UNITS, 200-250= 6 UNITS, 250-300= 9 UNITS, 300-350= 12 UNITS 40 mL 1   ??? insulin syringe-needle U-100 (BD INSULIN SYRINGE ULTRA-FINE) 1 mL 30 gauge x 1/2 Syrg USE AS DIRECTED. WITH LANTUS 100 each 1   ??? lancets (ACCU-CHEK SOFTCLIX LANCETS) Misc Inject 1 each under the skin Three (3) times a day. 300 each 11   ??? levothyroxine (SYNTHROID) 50 MCG tablet Take 1 tablet (50 mcg total) by mouth daily. 60 tablet 1   ??? lidocaine (XYLOCAINE) 5 % ointment Apply to painful areas in the groin TID as needed for pain 120 g 3   ??? loperamide (IMODIUM) 2 mg capsule Take 2 mg by mouth 4 (four) times a day as needed for diarrhea.     ??? MITIGARE 0.6 mg cap capsule Take 1.2 mg on Day 1 of flare, followed by 0.6 mg one hour later. Day 2 take 0.6 mg BID until flare resolves. 60 capsule 3   ??? montelukast (SINGULAIR) 10 mg tablet Take 1 tablet (10 mg total) by mouth daily. 90 each 1   ??? naproxen (NAPROSYN) 500 MG tablet Take 1 tablet (500 mg total) by mouth 2 (two) times a day with meals. 60 each 2   ??? nystatin (MYCOSTATIN) 100,000 unit/gram powder Apply to affected area 3 times daily 60 g 3   ??? ondansetron (ZOFRAN) 4 MG tablet Take 1-2 tablets (4-8 mg total) by mouth every twelve (12) hours as needed for nausea. 30 each 2   ??? oxybutynin (DITROPAN) 5 MG tablet Take 1 tablet (5 mg total) by mouth Two (2) times a day. 90 tablet 3   ??? oxyCODONE (ROXICODONE) 5 MG immediate release tablet Take 1 tablet (5 mg total) by mouth every four (4) hours as needed for pain. for up to 12 doses 12 tablet 0   ??? pantoprazole (PROTONIX) 40 MG tablet Take 1 tablet (40 mg total) by mouth daily at 0600. 90 tablet 3   ??? PROAIR HFA 90 mcg/actuation inhaler Inhale 2 puffs every four (4) hours as needed for wheezing. 3 Inhaler 1   ??? promethazine (PHENERGAN) 25 MG tablet Take 1-2 tablets (25-50 mg total) by mouth nightly as needed for nausea. 30 each 2   ??? propranolol (INDERAL LA) 120 mg 24 hr capsule Take 1 capsule (120 mg total) by mouth daily. 90 capsule 1   ??? ranitidine (ZANTAC) 150 MG tablet Take 1 tablet (150 mg total) by mouth Two (2) times a day. 180 tablet 3   ??? topiramate (TOPAMAX) 25 MG tablet Take 1 tablet (25 mg total) by mouth Two (2) times a day. 180 tablet 1   ??? topiramate (TOPAMAX) 50 MG tablet Take 1 tablet (50 mg total) by mouth Two (2) times a day. 180 tablet 1   ??? traZODone (DESYREL) 100 MG tablet take 2 tablets by mouth at bedtime  0     No current facility-administered medications for this visit.        Health Maintenance:     Health Maintenance Summary w/Most Recent Date       Status Date      Retinal Eye Exam Next Due 09/18/2018      Done 09/17/2017 SmartData: OPHTH FUNDUS OD PERIPHERY     Done 09/17/2017 SmartData: OPHTH FUNDUS OS PERIPHERY  Done 09/17/2017 SmartData: FINDINGS - PE - EYES - FUNDUSCOPIC - PERIPHERY - RIGHT PERIPHERY NORMAL     Done 09/30/2016 SmartData: OPHTH FUNDUS OD PERIPHERY     Done 09/30/2016 SmartData: OPHTH FUNDUS OS PERIPHERY     Patient has more history with this topic...    Foot Exam Next Due 10/07/2018      Done 10/06/2017 [facility] DI[facility]ET[facility] FOOT EXAM     Done 09/13/2016 [facility] DIABETES FOOT EXAM     Done 08/07/2015 [facility] DIABETES FOOT EXAM    Hemoglobin A1c Next Due 01/06/2019      Done 07/08/2018 Registry Metric: Last Hemoglobin A1c Date     Done 07/08/2018 POCT GLYCOSYLATED HEMOGLOBIN (HGB A1C) HGB A1C, RAP/PDS           Done 01/05/2018 POCT GLYCOSYLATED HEMOGLOBIN (HGB A1C) HGB A1C, RAP/PDS           Done 10/06/2017 POCT GLYCOSYLATED HEMOGLOBIN (HGB A1C) HGB A1C, RAP/PDS           Done 06/30/2017 POCT GLYCOSYLATED HEMOGLOBIN (HGB A1C) HGB A1C, RAP/PDS           Patient has more history with this topic...    Serum Creatinine Monitoring Next Due 04/08/2019      Done 04/07/2018 COMPREHENSIVE METABOLIC PANEL Creatinine           Done 04/20/2017 BASIC METABOLIC PANEL Creatinine           Done 11/10/2016 COMPREHENSIVE METABOLIC PANEL Creatinine           Done 06/23/2016 BASIC METABOLIC PANEL Creatinine           Done 06/22/2016 BASIC METABOLIC PANEL Creatinine           Patient has more history with this topic...    Potassium Monitoring Next Due 04/08/2019      Done 04/07/2018 COMPREHENSIVE METABOLIC PANEL Potassium           Done 04/20/2017 BASIC METABOLIC PANEL Potassium           Done 11/10/2016 COMPREHENSIVE METABOLIC PANEL Potassium           Done 06/23/2016 BASIC METABOLIC PANEL Potassium           Done 06/22/2016 BASIC METABOLIC PANEL Potassium           Patient has more history with this topic...    Urine Albumin/Creatinine Ratio Next Due 07/09/2019      Done 07/08/2018 Registry Metric: [facility] DM AMB LAST URINE MICROALBUMIN TO CREATININE RATIO     Done 07/08/2018 ALBUMIN / CREATININE URINE RATIO Albumin/Creatinine Ratio           Done 06/30/2017 ALBUMIN / CREATININE URINE RATIO Albumin/Creatinine Ratio           Done 05/24/2016 ALBUMIN / CREATININE URINE RATIO Albumin/Creatinine Ratio           Done 03/24/2015 ALBUMIN / CREATININE URINE RATIO Albumin/Creatinine Ratio           Patient has more history with this topic...    DTaP/Tdap/Td Vaccines Next Due 07/17/2023      Done 07/16/2013 Imm Admin: TdaP     Done 07/18/2008 Imm Admin: TdaP     Done 04/29/2000 Imm Admin: Tetanus and diptheria,(adult), adsorbed, 2Lf tetanus toxoid, PF    Pneumococcal Vaccine This plan is no longer active.      Done 12/30/2012 Imm Admin: PNEUMOCOCCAL POLYSACCHARIDE 23    Influenza Vaccine This plan is no longer active.      Done 02/23/2018 Imm  Admin: Influenza Vaccine Quad (IIV4 PF) 12mo+ injectable     Done 02/23/2018 Imm Admin: Influenza Virus Vaccine, unspecified formulation     Done 03/19/2017 Imm Admin: Influenza Vaccine Quad (IIV4 PF) 46mo+ injectable     Done 03/19/2017 Imm Admin: Influenza Virus Vaccine, unspecified formulation     Done 03/05/2016 Imm Admin: Influenza Vaccine Quad (IIV4 PF) 91mo+ injectable     Patient has more history with this topic...          Immunizations:     Immunization History   Administered Date(s) Administered   ??? Hepatitis B Vaccine, Unspecified Formulation 12/27/1999, 04/29/2000   ??? Hepatitis B, Adult 03/05/2013, 04/05/2013, 09/03/2013   ??? INFLUENZA TIV (TRI) PF (IM) 10/08/2011   ??? Influenza Vaccine Quad (IIV4 PF) 93mo+ injectable 03/05/2013, 03/18/2014, 03/22/2015, 03/05/2016, 03/19/2017, 02/23/2018   ??? Influenza Virus Vaccine, unspecified formulation 03/19/2017, 02/23/2018   ??? PNEUMOCOCCAL POLYSACCHARIDE 23 12/30/2012   ??? PPD Test 10/28/2016   ??? TdaP 07/18/2008, 07/16/2013   ??? Tetanus and diptheria,(adult), adsorbed, 2Lf tetanus toxoid, PF 04/29/2000       I have reviewed and (if needed) updated the patient's problem list, medications, allergies, past medical and surgical history, social and family history.    ROS:     General: no fatigue, fever, chills. Positive for weight gain.   ENT: denies ear symptoms, throat symptoms, nasal congestion,   Cardiovascular: denies chest pain, palpitations, tachycardia  Respiratory: denies, dyspnea, dyspnea on exertion, orthopnea, wheezing, cough  Gastrointestinal: denies nausea, vomiting, dyspepsia.  No abdominal pain, chronic constipation or diarrhea, melena, hematochezia,   Genitourinary: denies urinary difficulties, erectile dysfunction, Positive for dysuria.   Musculoskeletal: denies muscle pain or weakness, Positive for chronic back pain, foot pain.   Integumentary: denies rashes  or other skin problems  Neurological: denies headaches, dizziness, numbness, tingling, syncope  Psychological: denies symptoms suggesting depression, anxiety, sleep disturbance    Vital Signs:     Wt Readings from Last 3 Encounters:   07/22/18 (!) 219.1 kg (483 lb)   07/08/18 (!) 215.8 kg (475 lb 12.8 oz)   04/22/18 (!) 223.7 kg (493 lb 1.6 oz)     Temp Readings from Last 3 Encounters:   04/22/18 37.1 ??C (98.7 ??F) (Temporal)   06/30/17 36.4 ??C (97.6 ??F) (Oral)   06/23/17 36.8 ??C (98.3 ??F) (Oral)     BP Readings from Last 3 Encounters:   07/22/18 110/80   07/08/18 120/88   04/22/18 148/66     Pulse Readings from Last 3 Encounters:   07/22/18 66   07/08/18 73   04/22/18 64     Estimated body mass index is 68.27 kg/m?? as calculated from the following:    Height as of 04/22/18: 177.8 cm (5' 10).    Weight as of 07/08/18: 215.8 kg (475 lb 12.8 oz).  No height and weight on file for this encounter.           Labs:       No results found for this visit on 07/22/18.

## 2018-07-22 ENCOUNTER — Ambulatory Visit: Admit: 2018-07-22 | Discharge: 2018-07-23 | Payer: MEDICAID | Attending: Family | Primary: Family

## 2018-07-22 DIAGNOSIS — E039 Hypothyroidism, unspecified: Secondary | ICD-10-CM

## 2018-07-22 DIAGNOSIS — R300 Dysuria: Secondary | ICD-10-CM

## 2018-07-22 DIAGNOSIS — Z0000 Encounter for general adult medical examination without abnormal findings: Principal | ICD-10-CM

## 2018-07-22 DIAGNOSIS — Z1321 Encounter for screening for nutritional disorder: Secondary | ICD-10-CM

## 2018-07-22 NOTE — Unmapped (Signed)
Patient Education        Well Visit, Ages 47 to 61: Care Instructions  Your Care Instructions    Physical exams can help you stay healthy. Your doctor has checked your overall health and may have suggested ways to take good care of yourself. He or she also may have recommended tests. At home, you can help prevent illness with healthy eating, regular exercise, and other steps.  Follow-up care is a key part of your treatment and safety. Be sure to make and go to all appointments, and call your doctor if you are having problems. It's also a good idea to know your test results and keep a list of the medicines you take.  How can you care for yourself at home?  ?? Reach and stay at a healthy weight. This will lower your risk for many problems, such as obesity, diabetes, heart disease, and high blood pressure.  ?? Get at least 30 minutes of physical activity on most days of the week. Walking is a good choice. You also may want to do other activities, such as running, swimming, cycling, or playing tennis or team sports. Discuss any changes in your exercise program with your doctor.  ?? Do not smoke or allow others to smoke around you. If you need help quitting, talk to your doctor about stop-smoking programs and medicines. These can increase your chances of quitting for good.  ?? Talk to your doctor about whether you have any risk factors for sexually transmitted infections (STIs). Having one sex partner (who does not have STIs and does not have sex with anyone else) is a good way to avoid these infections.  ?? Use birth control if you do not want to have children at this time. Talk with your doctor about the choices available and what might be best for you.  ?? Protect your skin from too much sun. When you're outdoors from 10 a.m. to 4 p.m., stay in the shade or cover up with clothing and a hat with a wide brim. Wear sunglasses that block UV rays. Even when it's cloudy, put broad-spectrum sunscreen (SPF 30 or higher) on any exposed skin.  ?? See a dentist one or two times a year for checkups and to have your teeth cleaned.  ?? Wear a seat belt in the car.  Follow your doctor's advice about when to have certain tests. These tests can spot problems early.  For everyone  ?? Cholesterol. Have the fat (cholesterol) in your blood tested after age 28. Your doctor will tell you how often to have this done based on your age, family history, or other things that can increase your risk for heart disease.  ?? Blood pressure. Have your blood pressure checked during a routine doctor visit. Your doctor will tell you how often to check your blood pressure based on your age, your blood pressure results, and other factors.  ?? Vision. Talk with your doctor about how often to have a glaucoma test.  ?? Diabetes. Ask your doctor whether you should have tests for diabetes.  ?? Colon cancer. Your risk for colorectal cancer gets higher as you get older. Some experts say that adults should start regular screening at age 46 and stop at age 68. Others say to start before age 32 or continue after age 20. Talk with your doctor about your risk and when to start and stop screening.  For women  ?? Breast exam and mammogram. Talk to your doctor about when you  should have a clinical breast exam and a mammogram. Medical experts differ on whether and how often women under 50 should have these tests. Your doctor can help you decide what is right for you.  ?? Cervical cancer screening test and pelvic exam. Begin with a Pap test at age 29. The test often is part of a pelvic exam. Starting at age 5, you may choose to have a Pap test, an HPV test, or both tests at the same time (called co-testing). Talk with your doctor about how often to have testing.  ?? Tests for sexually transmitted infections (STIs). Ask whether you should have tests for STIs. You may be at risk if you have sex with more than one person, especially if your partners do not wear condoms.  For men  ?? Tests for sexually transmitted infections (STIs). Ask whether you should have tests for STIs. You may be at risk if you have sex with more than one person, especially if you do not wear a condom.  ?? Testicular cancer exam. Ask your doctor whether you should check your testicles regularly.  ?? Prostate exam. Talk to your doctor about whether you should have a blood test (called a PSA test) for prostate cancer. Experts differ on whether and when men should have this test. Some experts suggest it if you are older than 78 and are African-American or have a father or brother who got prostate cancer when he was younger than 70.  When should you call for help?  Watch closely for changes in your health, and be sure to contact your doctor if you have any problems or symptoms that concern you.  Where can you learn more?  Go to [facility] at https://myuncchart.org  [facility] under [employer]. Enter P072 in the search box to learn more about Well Visit, Ages 72 to 49: Care Instructions.  Current as of: January 28, 2018  Content Version: 12.3  ?? 2006-2019 [employer]. Care instructions adapted under license by [facility]. If you have questions about a medical condition or this instruction, always ask your healthcare professional. Healthwise, Incorporated disclaims any warranty or liability for your use of this information.

## 2018-07-23 MED ORDER — ACETAMINOPHEN 300 MG-CODEINE 30 MG TABLET
ORAL_TABLET | Freq: Four times a day (QID) | ORAL | 0 refills | 0.00000 days | Status: CP | PRN
Start: 2018-07-23 — End: 2019-01-06

## 2018-07-24 ENCOUNTER — Other Ambulatory Visit: Payer: Self-pay

## 2018-07-24 ENCOUNTER — Ambulatory Visit (INDEPENDENT_AMBULATORY_CARE_PROVIDER_SITE_OTHER): Payer: Medicaid Other | Admitting: Podiatry

## 2018-07-24 ENCOUNTER — Encounter: Payer: Self-pay | Admitting: Podiatry

## 2018-07-24 ENCOUNTER — Encounter: Payer: Self-pay | Admitting: Emergency Medicine

## 2018-07-24 ENCOUNTER — Ambulatory Visit: Payer: Medicaid Other

## 2018-07-24 ENCOUNTER — Ambulatory Visit
Admission: EM | Admit: 2018-07-24 | Discharge: 2018-07-24 | Disposition: A | Discharge status: Home / Self Care | Payer: Medicaid Other | Attending: Emergency Medicine | Admitting: Emergency Medicine

## 2018-07-24 DIAGNOSIS — E119 Type 2 diabetes mellitus without complications: Secondary | ICD-10-CM | POA: Insufficient documentation

## 2018-07-24 DIAGNOSIS — M722 Plantar fascial fibromatosis: Secondary | ICD-10-CM

## 2018-07-24 DIAGNOSIS — M79646 Pain in unspecified finger(s): Secondary | ICD-10-CM | POA: Diagnosis not present

## 2018-07-24 DIAGNOSIS — Z79899 Other long term (current) drug therapy: Secondary | ICD-10-CM | POA: Diagnosis not present

## 2018-07-24 DIAGNOSIS — Z794 Long term (current) use of insulin: Secondary | ICD-10-CM | POA: Insufficient documentation

## 2018-07-24 DIAGNOSIS — R200 Anesthesia of skin: Secondary | ICD-10-CM | POA: Insufficient documentation

## 2018-07-24 DIAGNOSIS — M545 Low back pain: Secondary | ICD-10-CM

## 2018-07-24 DIAGNOSIS — M79642 Pain in left hand: Secondary | ICD-10-CM | POA: Diagnosis present

## 2018-07-24 DIAGNOSIS — S39012A Strain of muscle, fascia and tendon of lower back, initial encounter: Secondary | ICD-10-CM | POA: Diagnosis not present

## 2018-07-24 DIAGNOSIS — R51 Headache: Secondary | ICD-10-CM | POA: Diagnosis not present

## 2018-07-24 DIAGNOSIS — S60222A Contusion of left hand, initial encounter: Secondary | ICD-10-CM

## 2018-07-24 MED ORDER — TIZANIDINE HCL 4 MG PO TABS: 4.0000 mg | ORAL_TABLET | Freq: Three times a day (TID) | ORAL | 0 refills | Status: DC | PRN

## 2018-07-24 MED ORDER — IBUPROFEN 600 MG PO TABS: 600.0000 mg | ORAL_TABLET | Freq: Four times a day (QID) | ORAL | 0 refills | Status: DC | PRN
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
## 2018-07-24 NOTE — Patient Instructions (Signed)
Pre-Operative Instructions  Congratulations, you have decided to take an important step towards improving your quality of life.  You can be assured that the doctors and staff at [facility] will be with you every step of the way.  Here are some important things you should know:  1. Plan to be at the surgery center/hospital at least 1 (one) hour prior to your scheduled time, unless otherwise directed by the surgical center/hospital staff.  You must have a responsible adult accompany you, remain during the surgery and drive you home.  Make sure you have directions to the surgical center/hospital to ensure you arrive on time. 2. If you are having surgery at [facility] or [facility] hospitals, you will need a copy of your medical history and physical form from your family physician within one month prior to the date of surgery. We will give you a form for your primary physician to complete.  3. We make every effort to accommodate the date you request for surgery.  However, there are times where surgery dates or times have to be moved.  We will contact you as soon as possible if a change in schedule is required.   4. No aspirin/ibuprofen for one week before surgery.  If you are on aspirin, any non-steroidal anti-inflammatory medications (Mobic, Aleve, Ibuprofen) should not be taken seven (7) days prior to your surgery.  You make take Tylenol for pain prior to surgery.  5. Medications - If you are taking daily heart and blood pressure medications, seizure, reflux, allergy, asthma, anxiety, pain or diabetes medications, make sure you notify the surgery center/hospital before the day of surgery so they can tell you which medications you should take or avoid the day of surgery. 6. No food or drink after midnight the night before surgery unless directed otherwise by surgical center/hospital staff. 7. No alcoholic beverages 24-hours prior to surgery.  No smoking 24-hours prior or 24-hours after  surgery. 8. Wear loose pants or shorts. They should be loose enough to fit over bandages, boots, and casts. 9. Don't wear slip-on shoes. Sneakers are preferred. 10. Bring your boot with you to the surgery center/hospital.  Also bring crutches or a walker if your physician has prescribed it for you.  If you do not have this equipment, it will be provided for you after surgery. 11. If you have not been contacted by the surgery center/hospital by the day before your surgery, call to confirm the date and time of your surgery. 12. Leave-time from work may vary depending on the type of surgery you have.  Appropriate arrangements should be made prior to surgery with your employer. 13. Prescriptions will be provided immediately following surgery by your doctor.  Fill these as soon as possible after surgery and take the medication as directed. Pain medications will not be refilled on weekends and must be approved by the doctor. 14. Remove nail polish on the operative foot and avoid getting pedicures prior to surgery. 15. Wash the night before surgery.  The night before surgery wash the foot and leg well with water and the antibacterial soap provided. Be sure to pay special attention to beneath the toenails and in between the toes.  Wash for at least three (3) minutes. Rinse thoroughly with water and dry well with a towel.  Perform this wash unless told not to do so by your physician.  Enclosed: 1 Ice pack (please put in freezer the night before surgery)   1 Hibiclens skin cleaner     Pre-op instructions  If you have any questions regarding the instructions, please do not hesitate to call our office.  [place]: [address], [place], [place] [address] -- [phone]  [place]: [address] [place], [place] [address] -- [phone]  [place]: [address]  , [place] [address] -- [phone]  [place]: [address], [place], [place] [address] -- [phone]  Website:  [email or web address] 

## 2018-07-24 NOTE — Discharge Instructions (Signed)
People tend to feel worse over the next several days, but most people are back to normal in 1 week. A small number of people will have persistent pain for up to six weeks. Take the 600 mg of ibuprofen with 1 gram of tylenol 4 times a day.   Ice your hand for 20 minutes at a time.  Follow-up with Dr. Stephenie Acres if you are not better in 2 weeks.   Anaflex will help your back.  Go to www.goodrx.com to look up your medications. This will give you a list of where you can find your prescriptions at the most affordable prices. Or ask the pharmacist what the cash price is, or if they have any other discount programs available to help make your medication more affordable. This can be less expensive than what you would pay with insurance.
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
## 2018-07-30 NOTE — Unmapped (Signed)
[facility] Refill Coordination Note  Specialty Medication(s): Humira 40mg   Additional Medications shipped: none    Kedric Bumgarner, DOB: August 28, 1980  Phone: [phone] (home) , Alternate phone contact: N/A  Phone or address changes today?: No  All above HIPAA information was verified with patient.  Shipping Address: [address]  [place] [address]   Insurance changes? No    Completed refill call assessment today to schedule patient's medication shipment from the [facility] [phone]).      Confirmed the medication and dosage are correct and have not changed: Yes, regimen is correct and unchanged.    Confirmed patient started or stopped the following medications in the past month:  Yes. Bryndan reports starting the following medications: levothyroxine    Are you tolerating your medication?:  Quadarius reports side effects of cold intolerance. Nijel also mentioned he has been diagnosed with hypothyroidism and is taking levothyroxine.  Mentioned to patient that a thyroid issue commonly can cause temperature sensitivity.    ADHERENCE    (Below is required for Medicare Part B or Transplant patients only - per drug):   How many tablets were dispensed last month: 4 pens  Patient currently has 3 remaining. Next dose due 07/30/18, 08/06/18, and 08/13/18    Did you miss any doses in the past 4 weeks? No missed doses reported.    FINANCIAL/SHIPPING    Delivery Scheduled: Yes, Expected medication delivery date: 08/11/18     Medication will be delivered via Same Day Courier to the home address in [facility].    The patient will receive a drug information handout for each medication shipped and additional FDA Medication Guides as required.      Jamesmichael did not have any additional questions at this time.    We will follow up with patient monthly for standard refill processing and delivery.      Thank you,  Breck Coons Shared [facility] Specialty Pharmacist

## 2018-08-06 MED ORDER — LANTUS U-100 INSULIN 100 UNIT/ML SUBCUTANEOUS SOLUTION
Freq: Every evening | SUBCUTANEOUS | 1 refills | 0.00000 days | Status: CP
Start: 2018-08-06 — End: 2018-12-23

## 2018-08-11 MED FILL — EMPTY CONTAINER: 120 days supply | Qty: 1 | Fill #1 | Status: AC

## 2018-08-11 MED FILL — HUMIRA PEN CITRATE FREE 40 MG/0.4 ML: 28 days supply | Qty: 4 | Fill #3 | Status: AC

## 2018-08-11 MED FILL — HUMIRA PEN CITRATE FREE 40 MG/0.4 ML: SUBCUTANEOUS | 28 days supply | Qty: 4 | Fill #3

## 2018-08-11 MED FILL — EMPTY CONTAINER: 120 days supply | Qty: 1 | Fill #1

## 2018-08-11 NOTE — Progress Notes (Signed)
   HPI: 38 year old male with morbid obesity, DMII, sleep apnea, and lymphedema presents the office today as a referral from another podiatrist regarding a surgical consult for chronic plantar fasciitis bilateral.  Patient has been managed conservatively by Dr. Al Corpus in the office.  Conservative modalities have been unsuccessful in providing sort of patient satisfactory alleviation.  He presents today for further treatment and evaluation  Past Medical History:  Diagnosis Date  . Asthma   . Depressed   . Diabetes mellitus without complication (HCC)   . IBS (irritable bowel syndrome)   . Morbid obesity (HCC)      Physical Exam: General: The patient is alert and oriented x3 in no acute distress.  Dermatology: Skin is warm, dry and supple bilateral lower extremities. Negative for open lesions or macerations.  Vascular: Palpable pedal pulses bilaterally. No edema or erythema noted. Capillary refill within normal limits.  Neurological: Epicritic and protective threshold grossly intact bilaterally.   Musculoskeletal Exam: Significant pain on palpation noted to the posterior heels at the insertion of the plantar fascia bilateral.  Range of motion within normal limits to all pedal and ankle joints bilateral. Muscle strength 5/5 in all groups bilateral.   Assessment: 1.  Chronic plantar fasciitis bilateral   Plan of Care:  1. Patient evaluated.  2.  Due to the patient's multiple comorbidities I do not feel that he is a surgical candidate at the moment.  Recommend that the patient lose weight prior to surgery to improve the patient's outcomes.  Patient does have appointments with weight loss physicians to discuss possible weight loss surgery. 3.  Patient is currently on Voltaren 75 mg oral as needed migraines.  Continue as per PCP 4.  Continue Tylenol 3 as per PCP 5.  Return to clinic as needed      Felecia Shelling, DPM [facility]  Dr. Felecia Shelling, DPM    [address]  [place], [place] [address]                Office [phone]  Fax [phone]

## 2018-08-20 ENCOUNTER — Other Ambulatory Visit: Admit: 2018-08-20 | Discharge: 2018-08-21 | Payer: MEDICAID

## 2018-08-20 DIAGNOSIS — G43909 Migraine, unspecified, not intractable, without status migrainosus: Principal | ICD-10-CM

## 2018-08-20 DIAGNOSIS — E039 Hypothyroidism, unspecified: Principal | ICD-10-CM

## 2018-08-20 DIAGNOSIS — Z1321 Encounter for screening for nutritional disorder: Principal | ICD-10-CM

## 2018-08-20 NOTE — Unmapped (Signed)
Lab visit

## 2018-08-20 NOTE — Unmapped (Signed)
P/C requesting refill on Aimovig , last ordered on 05/18/18, last visit 07/22/2018.

## 2018-08-21 MED ORDER — AIMOVIG AUTOINJECTOR 70 MG/ML SUBCUTANEOUS AUTO-INJECTOR
INJECTION | SUBCUTANEOUS | 3 refills | 0.00000 days | Status: CP
Start: 2018-08-21 — End: 2019-01-06

## 2018-08-21 MED ORDER — ERGOCALCIFEROL (VITAMIN D2) 1,250 MCG (50,000 UNIT) CAPSULE
ORAL_CAPSULE | ORAL | 2 refills | 0.00000 days | Status: CP
Start: 2018-08-21 — End: 2019-01-06

## 2018-08-28 ENCOUNTER — Encounter (INDEPENDENT_AMBULATORY_CARE_PROVIDER_SITE_OTHER): Payer: Self-pay

## 2018-08-28 NOTE — Unmapped (Addendum)
Triage Note       Patient :Logan Quinn                              MR: [id number]      Reason for call:  frequency of urination, requesting increase in medication      Time call returned: 220      Phone Assessment:   Spoke to pt, going to BR frequently.. Nocturia x 3-4. No dysuria, no fcnv, Pt has changed what he drinks to Gatorade and increased po fluids recently . Has noticed change in frequent urination since switch from Soda to Gatorade. * negative urine check with PCP recently    Triage Recommendations: Pt will monitor po fluid intake. Limit to ~ 64 fl oz per day. Switch from Gatorade to water or flavored water. Limit caffeine. Monitor trips to BR, ( does not have to measure )  See if improvement or change.   Monitor for one week. Communicate via MyChart.       Patient Response:  Pt agrees with plan,  informed pt that provider will be informed of plan and involved in further input. Pt appreciative      Outstanding tasks: Inform provider of plan and open for further input      Patient Pharmacy has been verified and primary pharmacy has been marked as preferred.  Verified    Elvina Sidle, LPN

## 2018-08-31 NOTE — Telephone Encounter (Signed)
Pleae contact Jason Davidson and let him know that he can turn down the compression some on his pump.  Also, he should regularly be wearing compression on his lower extremities as well as elevating.  He should also use tylenol or ibuprofen for pain.    Currently due to concerns for Covid-19 we are only leg swelling/varicose vein patients if they have an open wound or ulceration.  If he continues to have pain we can schedule a visit in 6 weeks or so, once restrictions are lifted.

## 2018-09-02 NOTE — Unmapped (Signed)
[facility] Refill Coordination Note    Specialty Medication(s) to be Shipped:   Inflammatory Disorders: Humira    Other medication(s) to be shipped: na     Logan Quinn, DOB: 04-24-1981  Phone: [phone] (home)       All above HIPAA information was verified with patient.     Completed refill call assessment today to schedule patient's medication shipment from the [facility] [phone]).       Specialty medication(s) and dose(s) confirmed: Regimen is correct and unchanged.   Changes to medications: Logan Quinn reports no changes reported at this time.  Changes to insurance: No  Questions for the pharmacist: No    Confirmed patient received Welcome Packet with first shipment. The patient will receive a drug information handout for each medication shipped and additional FDA Medication Guides as required.       DISEASE/MEDICATION-SPECIFIC INFORMATION        N/A    SPECIALTY MEDICATION ADHERENCE     Medication Adherence    Patient reported X missed doses in the last month:  0  Specialty Medication:  humira  Patient is on additional specialty medications:  No  Patient is on more than two specialty medications:  No  Any gaps in refill history greater than 2 weeks in the last 3 months:  no  Demonstrates understanding of importance of adherence:  yes  Informant:  patient  Reliability of informant:  reliable  Confirmed plan for next specialty medication refill:  delivery by pharmacy  Refills needed for supportive medications:  not needed          Refill Coordination    Has the Patients' Contact Information Changed:  No  Is the Shipping Address Different:  No         Humira injections. Patient has 1 box remaining      SHIPPING     Shipping address confirmed in Epic.     Delivery Scheduled: Yes, Expected medication delivery date: 040220.     Medication will be delivered via Same Day Courier to the home address in Epic WAM.    Velina Drollinger D [name]   [facility] Specialty Technician

## 2018-09-03 MED ORDER — HYDROCODONE 10 MG-ACETAMINOPHEN 325 MG TABLET
ORAL_TABLET | Freq: Three times a day (TID) | ORAL | 0 refills | 0 days | Status: CP | PRN
Start: 2018-09-03 — End: 2018-09-17

## 2018-09-07 ENCOUNTER — Encounter: Payer: Self-pay | Admitting: Podiatry

## 2018-09-07 NOTE — Unmapped (Signed)
error 
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
## 2018-09-07 NOTE — Unmapped (Signed)
I have already received documentation from this outside provider. The notes were reviewed and prescription was sent in last week.

## 2018-09-08 NOTE — Unmapped (Signed)
PA Hydrocodone faxed to [facility]

## 2018-09-10 ENCOUNTER — Institutional Professional Consult (permissible substitution): Admit: 2018-09-10 | Discharge: 2018-09-11 | Payer: MEDICAID | Attending: Dermatology | Primary: Dermatology

## 2018-09-10 DIAGNOSIS — L732 Hidradenitis suppurativa: Principal | ICD-10-CM

## 2018-09-10 MED ORDER — HUMIRA PEN CITRATE FREE 40 MG/0.4 ML
SUBCUTANEOUS | 3 refills | 0.00000 days | Status: CP
Start: 2018-09-10 — End: 2018-11-24
  Filled 2018-09-10: qty 4, 28d supply, fill #4
  Filled 2018-10-07: qty 4, 28d supply, fill #0

## 2018-09-10 MED ORDER — DOXYCYCLINE MONOHYDRATE 100 MG TABLET
ORAL_TABLET | Freq: Every day | ORAL | 0 refills | 0.00000 days | Status: CP
Start: 2018-09-10 — End: 2019-01-06

## 2018-09-10 MED FILL — HUMIRA PEN CITRATE FREE 40 MG/0.4 ML: 28 days supply | Qty: 4 | Fill #4 | Status: AC

## 2018-09-10 NOTE — Unmapped (Deleted)
[facility]  Dermatology Telephone Visit  Established Patient     Encounter Description/Consent: This encounter was conducted from Provider home office  via telephone with the patient. Logan Quinn was located in his home. Discussed the choice to participate in care through the use of teledermatology by telephone service. Patient verbally understands the risks and benefits of teledermatology as explained. All questions regarding teledermatology answered.    I notified him that, because this is a special type of visit, he may receive  a bill for a copay or coinsurance. He has agreed with proceeding.    Time Spent: {ZOXW:96045::*** minutes}    Assessment:  Logan Quinn is a 38 y.o. male with a history of hidradenitis suppurativa who is an established patient in [facility] D[facility]atology outpatient clinics and is participating in teledermatology follow-up by telephone service.    Plan:    There are no diagnoses linked to this encounter.     ***    Subjective:     Patient interviewed in a private place.     Chief Concern:  HS     Interval History:  Doxycycline completed this week. On humira injections once weekly. Tolerating it well. Was      ***  Medications: {Desc; reviewed/not reviewed:53335}.    Social History: reviewed; pertinents have been documented in the interval history section.    ROS:  Per the patient response,Other than what was mentioned in the history of present illness, the patient's review of systems is negative. Specifically, the patient has not experienced any recent fever, chills. No other skin complaints except noted per HPI.     Objective:  General: Conversational, in no distress.  Neuro: Alert, answering questions appropriately.  Skin: Images of the *** were uploaded by patient and evaluated on the electronic medical record. Findings were normal with exception of the following:  - ***  - All other areas examined were normal or had no significant findings    Venia Carbon, MD  09/10/2018

## 2018-09-11 NOTE — Unmapped (Signed)
[facility]  Dermatology Telephone Visit  Established Patient     Encounter Description/Consent: This encounter was conducted from Provider home office  via telephone with the patient. Logan Quinn was located in his home. Discussed the choice to participate in care through the use of teledermatology by telephone service. Patient verbally understands the risks and benefits of teledermatology as explained. All questions regarding teledermatology answered.    I notified him that, because this is a special type of visit, he may receive  a bill for a copay or coinsurance. He has agreed with proceeding.    Time Spent: 15 minutes    Assessment:  [name] is a 38 y.o. male with a history of hidradenitis suppurativa who is an established patient in [facility] Dermatology outpatient clinics and is participating in teledermatology follow-up by telephone service. HS is with improved control on humira and without flare since recent completion of doxycycline 100mg  BID x 3 months.     Plan:    Hidradenitis suppurativa  - given COVID-19, will delay unroofing procedure for future date   - RX doxycycline (ADOXA) 100 MG tablet; Take 1 tablet (100 mg total) by mouth daily. Take with food and full glass of water. Don't lie down for 30 min after taking. R/B/A reviewed including GI upset.   - HUMIRA PEN CITRATE FREE 40 MG/0.4 ML; Inject the contents of 1 pen (40 mg total) under the skin every seven (7) days. R/B/A reviewed including increased risk of infection. Encouraged to follow CDC guidance to lower risk of COVID-19 infection   - Wash with OTC hibiclens soap     Subjective:     Patient interviewed in a private place.     Chief Concern:  HS     Interval History:  LV with Dr. Trisha Mangle 04/2018. At that visit was started on doxycycline and humira.     Doxycycline completed this week after 100mg  BID x 3 month course. On humira injections once weekly. Tolerating it well. Not using hibiclens wash or any other washes in particular. Has done clinamycin topically in past and it didn't help that much. Has been on metformin for diabetes in the past but took off of it due to gi upset.     Currently HS is at a tolerable degree of activity. Mild flare in groin. No involvement in axillae. Non smoker.       Medications: reviewed EPIC .    Social History: reviewed; pertinents have been documented in the interval history section.    Family History - no ho HS     ROS:  Per the patient response,Other than what was mentioned in the history of present illness, the patient's review of systems is negative. Specifically, the patient has not experienced any recent fever, chills. No other skin complaints except noted per HPI.     Objective:  General: Conversational, in no distress.  Neuro: Alert, answering questions appropriately.  Skin: Images of the bilateral inguinal folds were uploaded by patient and evaluated on the electronic medical record. Findings were normal with exception of the following:  - inflammatory non draining papules in bilateral inguinal folds   - All other areas examined were normal or had no significant findings    Venia Carbon, MD  09/10/2018

## 2018-09-14 ENCOUNTER — Ambulatory Visit: Payer: Medicaid Other | Admitting: Podiatry

## 2018-09-16 ENCOUNTER — Ambulatory Visit: Payer: Medicaid Other | Admitting: Podiatry

## 2018-09-17 ENCOUNTER — Ambulatory Visit: Payer: Medicaid Other | Admitting: Podiatry

## 2018-09-17 MED ORDER — HYDROCODONE 10 MG-ACETAMINOPHEN 325 MG TABLET
ORAL_TABLET | Freq: Three times a day (TID) | ORAL | 0 refills | 0.00000 days | Status: CP | PRN
Start: 2018-09-17 — End: 2018-10-12

## 2018-09-17 NOTE — Unmapped (Signed)
P/C requesting refill on Hydrocodone , last ordered on 09/04/2018, only got #21 tablets due to need for PA, last visit 07/22/2018.

## 2018-10-01 NOTE — Unmapped (Signed)
[facility] Refill Coordination Note    Specialty Medication(s) to be Shipped:   Inflammatory Disorders: Humira    Other medication(s) to be shipped: na     Logan Quinn, DOB: Nov 05, 1980  Phone: [phone] (home)       All above HIPAA information was verified with patient.     Completed refill call assessment today to schedule patient's medication shipment from the [facility] [phone]).       Specialty medication(s) and dose(s) confirmed: Regimen is correct and unchanged.   Changes to medications: Anik reports no changes at this time.  Changes to insurance: No  Questions for the pharmacist: No    Confirmed patient received Welcome Packet with first shipment. The patient will receive a drug information handout for each medication shipped and additional FDA Medication Guides as required.       DISEASE/MEDICATION-SPECIFIC INFORMATION        N/A    SPECIALTY MEDICATION ADHERENCE     Medication Adherence    Patient reported X missed doses in the last month:  0  Specialty Medication:  Humira  Patient is on additional specialty medications:  No  Patient is on more than two specialty medications:  No  Any gaps in refill history greater than 2 weeks in the last 3 months:  no  Demonstrates understanding of importance of adherence:  yes  Informant:  patient  Reliability of informant:  reliable  Confirmed plan for next specialty medication refill:  delivery by pharmacy  Refills needed for supportive medications:  not needed          Refill Coordination    Has the Patients' Contact Information Changed:  No  Is the Shipping Address Different:  No           Humira injections. Patient has 1 pen remaining that he will administer today      SHIPPING     Shipping address confirmed in Epic.     Delivery Scheduled: Yes, Expected medication delivery date: 042920.     Medication will be delivered via Same Day Courier to the home address in Epic WAM.    Issabella Rix D Brandii Lakey   [facility] Specialty Technician

## 2018-10-05 MED ORDER — LEVOTHYROXINE 50 MCG TABLET
ORAL_TABLET | Freq: Every day | ORAL | 1 refills | 0 days | Status: CP
Start: 2018-10-05 — End: 2019-01-06

## 2018-10-07 MED FILL — HUMIRA PEN CITRATE FREE 40 MG/0.4 ML: 28 days supply | Qty: 4 | Fill #0 | Status: AC

## 2018-10-12 NOTE — Unmapped (Signed)
P/C requesting refill on Hydrocodone , last ordered on 09/17/2018, last visit 07/22/2018. No contract , last UDS was 01/05/18 and was negative

## 2018-10-15 MED ORDER — FAMOTIDINE 20 MG TABLET
ORAL_TABLET | Freq: Two times a day (BID) | ORAL | 2 refills | 0.00000 days | Status: CP
Start: 2018-10-15 — End: 2018-10-21

## 2018-10-15 NOTE — Unmapped (Signed)
Assessment and Plan:     Logan Quinn was seen today for diabetes.    Diagnoses and all orders for this visit:    Type 2 diabetes mellitus with hyperglycemia, with long-term current use of insulin (CMS-HCC)  HGB A1c 7.5% (up from 6.7 three months ago).   Continue current medication regimen.   Reviewed carb controlled diet. Encouraged regular exercise and weight loss.   Diabetic foot exam: Monofilament testing. Left 9/10 and Right 10/10. Diminished at left heel due to callused skin.   -     POCT glycosylated hemoglobin (Hb A1C)  -     [facility] DIABETES FOOT EXAM    Gastroesophageal reflux disease, esophagitis presence not specified  Well controlled. Continue pantoprazole daily. Pepcid not available at pharmacy, will replace with cimetidine.   Start cimetidine 200 mg BID.   -     cimetidine (TAGAMET) 200 MG tablet; Take 1 tablet (200 mg total) by mouth Two (2) times a day.    Tinea cruris  Take fluconazole 150 mg for 1 dose. Repeat 150 mg in one week.   Continue applying nystatin powder daily.   -     nystatin (MYCOSTATIN) 100,000 unit/gram powder; Apply to affected area 3 times daily  -     fluconazole (DIFLUCAN) 150 MG tablet; Take 1 tablet (150 mg total) by mouth once for 1 dose. Repeat 150 mg in one week.      HPI:      Logan Quinn  is here for   Chief Complaint   Patient presents with   ??? Diabetes     Diabetes: Patient presents for follow up of diabetes.  A1C goal is <8.  Diabetes has customarily been at goal (complicated by: obesity, poor dietary choices and portion control during COVID-19 quarantine).  Current symptoms include: polydipsia and polyuria. Symptoms have progressed to a point and plateaued. Patient denies foot ulcerations, hyperglycemia, hypoglycemia , increased appetite, nausea, paresthesia of the feet, visual disturbances, vomiting and weight loss. Evaluation to date has included: hemoglobin A1C.  Home sugars: 118 - low 200s after dinner. Current treatment: Continued insulin which has been effective and Continued sulfonylurea which has been effective.  Doing regular exercise: no.   Patient due for diabetic eye exam and foot exam.   He is agreeable to complete foot exam today.     Answers for HPI/ROS submitted by the patient on 10/19/2018   Diabetes problem  Diabetes type: type 2  MedicAlert ID: No  fatigue: No  foot paresthesias: No  foot ulcerations: No  polyphagia: No  polyuria: Yes  visual change: No  Symptom course: stable  confusion: No  hunger: No  mood changes: No  pallor: Yes  sleepiness: Yes  speech difficulty: No  sweats: No  blackouts: No  hospitalization: No  nocturnal hypoglycemia: No  required assistance: No  required glucagon: No  CVA: No  heart disease: No  impotence: Yes  nephropathy: No  peripheral neuropathy: No  PVD: No  retinopathy: No  CAD risks: family history, obesity, stress  Current treatments: diet, insulin injections, oral agent (monotherapy)  Dose schedule: pre-breakfast, pre-lunch, pre-dinner, at bedtime  Given by: patient, significant other  Injection sites: abdominal wall, arms, buttocks, thighs  Home blood tests: 3-4 x per week  Monitoring compliance: fair  Blood glucose trend: no change  breakfast time: 6-7 am  breakfast glucose level: 90-110  lunch time: 12-1 pm  lunch glucose level: 90-110  dinner time: 6-7 pm  dinner glucose level:  130-140  High score: 180-200  Overall: 180-200  Weight trend: stable  Meal planning: calorie counting, carbohydrate counting  Exercise: intermittently  Dietitian visit: No  Eye exam current: No  Sees podiatrist: Yes    Patient reports genital yeast symptoms. He is currently taking doxycycline.     Patient states he was unable to pick up Pepcid because pharmacy told him it was recalled. He requests a replacement.     Patient has TeleVisit soon with PT for lumbar back pain. He states they had discussed plan to wean off Norco and anticipates being released soon.     Labs on 08/20/18 showed low Vitamin D. Patient is taking vitamin D supplement daily.     Patient is currently taking Humira. He reports he has been taking necessary precautions during COVID-19 pandemic through social distancing, wearing masks in public spaces, and frequent hand-washing.        [facility] Components:     Goals     ??? Self- Management Goal        Things to think about to help me reach my goal:     What are you going to do? Increase activity/motivation   How and how much? Clean up and do something constructive   How frequent? 2 x week    Barriers to success? fatigue   Solutions to barriers? Going to sleep earlier          ??? Self- Management Goal        Things to think about to help me reach my goal:     What are you going to do? Continue physical activity    How and how much? Fixing house    How frequent? daily   Barriers to success?    Solutions to barriers?                Medication adherence and barriers to the treatment plan have been addressed. Opportunities to optimize healthy behaviors have been discussed. Patient / caregiver voiced understanding.      Past Medical/Surgical History:     Past Medical History:   Diagnosis Date   ??? Acne    ??? Allergic    ??? Anxiety    ??? Depression    ??? Diabetes mellitus (CMS-HCC)    ??? GERD (gastroesophageal reflux disease)    ??? Gout    ??? Hypertension    ??? IBS (irritable bowel syndrome)    ??? 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 Liver disease    ??? Migraines    ??? Morbid obesity with BMI of 60.0-69.9, adult (CMS-HCC)    ??? 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 Obstructive sleep apnea    ??? OSA on CPAP    ??? Severe obstructive sleep apnea    ??? Trapezius muscle strain 12/07/2013   ??? Urinary incontinence, nocturnal enuresis    ??? Venous insufficiency      Past Surgical History:   Procedure Laterality Date   ??? EYE SURGERY  11/15   ??? PR COLONOSCOPY FLX DX W/COLLJ SPEC WHEN PFRMD N/A 03/24/2013    Procedure: COLONOSCOPY, FLEXIBLE, PROXIMAL TO SPLENIC FLEXURE; DIAGNOSTIC, W/WO COLLECTION SPECIMEN BY BRUSH OR WASH;  Surgeon: Clint Bolder, MD;  [facility];  Service: Gastroenterology   ??? PR EYE SURG POST SGMT PROC UNLISTED Left     pneumatic retinopexy OS   ??? PR UPPER GI ENDOSCOPY,DIAGNOSIS N/A 02/02/2013    Procedure: UGI ENDO, INCLUDE ESOPHAGUS, STOMACH, &  DUODENUM &/OR JEJUNUM; DX W/WO COLLECTION SPECIMN, BY BRUSH OR WASH;  Surgeon: Malcolm Metro, MD;  Location: [facility];  Service: Gastroenterology   ??? [place] PYLORIC STENOSIS ([place] HISTORICAL RESULT)         Family History:     Family History   Problem Relation Age of Onset   ??? Cancer Maternal Grandfather    ??? Hearing loss Maternal Grandfather    ??? Cancer Paternal Grandfather    ??? COPD Paternal Grandmother    ??? Arthritis Paternal Grandmother    ??? Depression Paternal Grandmother    ??? Diabetes Mother    ??? Heart disease Mother    ??? Migraines Mother    ??? Arthritis Mother    ??? Depression Mother    ??? GER disease Mother    ??? Hypertension Mother    ??? Angina Mother    ??? COPD Mother    ??? Glaucoma Mother    ??? Diabetes Sister    ??? Migraines Sister    ??? Asthma Sister    ??? Depression Sister    ??? Diabetes Brother    ??? Asthma Brother    ??? Diabetes Maternal Grandmother    ??? Heart disease Maternal Grandmother    ??? Migraines Maternal Grandmother    ??? Depression Maternal Grandmother    ??? Angina Maternal Grandmother    ??? Hypertension Maternal Grandmother    ??? Diabetes Maternal Uncle    ??? Diabetes Maternal Uncle    ??? Liver disease Maternal Uncle    ??? Kidney disease Maternal Uncle    ??? Asthma Brother    ??? Colorectal Cancer Neg Hx    ??? Esophageal cancer Neg Hx    ??? Liver cancer Neg Hx    ??? Pancreatic cancer Neg Hx    ??? Stomach cancer Neg Hx    ??? Glaucoma Neg Hx    ??? Amblyopia Neg Hx    ??? Blindness Neg Hx    ??? Retinal detachment Neg Hx    ??? Strabismus Neg Hx    ??? Macular degeneration Neg Hx    ??? Anesthesia problems Neg Hx    ??? Broken bones Neg Hx    ??? Clotting disorder Neg Hx    ??? Collagen disease Neg Hx    ??? Dislocations Neg Hx    ??? Fibromyalgia Neg Hx    ??? Gout Neg Hx    ??? Hemophilia Neg Hx    ??? Osteoporosis Neg Hx    ??? Rheumatologic disease Neg Hx    ??? Scoliosis Neg Hx    ??? Severe sprains Neg Hx    ??? Sickle cell anemia Neg Hx    ??? Spinal Compression Fracture Neg Hx    ??? Melanoma Neg Hx    ??? Basal cell carcinoma Neg Hx    ??? Squamous cell carcinoma Neg Hx    ??? Deep vein thrombosis Neg Hx        Social History:     Social History     Socioeconomic History   ??? Marital status: Married     Spouse name: None   ??? Number of children: None   ??? Years of education: None   ??? Highest education level: None   Occupational History   ??? None   Social Needs   ??? Financial resource strain: None   ??? Food insecurity     Worry: None     Inability: None   ??? Transportation needs  Medical: None     Non-medical: None   Tobacco Use   ??? Smoking status: Never Smoker   ??? Smokeless tobacco: Never Used   Substance and Sexual Activity   ??? Alcohol use: Never     Alcohol/week: 0.0 standard drinks     Comment: rare social   ??? Drug use: Never   ??? Sexual activity: Yes     Partners: Female     Birth control/protection: None   Lifestyle   ??? Physical activity     Days per week: None     Minutes per session: None   ??? Stress: None   Relationships   ??? Social Wellsite geologist on phone: None     Gets together: None     Attends religious service: None     Active member of club or organization: None     Attends meetings of clubs or organizations: None     Relationship status: None   Other Topics Concern   ??? Do you use sunscreen? Yes   ??? Tanning bed use? No   ??? Are you easily burned? No   ??? Excessive sun exposure? No   ??? Blistering sunburns? No   Social History Narrative   ??? None       Allergies:     Erythromycin; Penicillins; Sulfa (sulfonamide antibiotics); Other; Erythromycin base; and Sulfasalazine    Current Medications:     Current Outpatient Medications   Medication Sig Dispense Refill   ??? ACCU-CHEK SOFTCLIX LANCETS lancets TEST three times a day 100 each 3   ??? acetaminophen (TYLENOL) 500 MG tablet Take 2 tablets (1,000 mg total) by mouth Three (3) times a day. 30 tablet 0   ??? acetaminophen-codeine (TYLENOL #3) 300-30 mg per tablet Take 1 tablet by mouth every six (6) hours as needed for pain. 60 tablet 0   ??? ADVAIR DISKUS 500-50 mcg/dose diskus Inhale 1 puff 2 (two) times a day. 13 Inhaler 1   ??? albuterol (PROAIR HFA) 90 mcg/actuation inhaler ProAir HFA 90 mcg/actuation aerosol inhaler     ??? ALPRAZolam (XANAX) 0.5 MG tablet Take 1 tablet (0.5 mg total) by mouth Three (3) times a day. (Patient taking differently: Take 1 mg by mouth Three (3) times a day. ) 60 tablet 0   ??? blood sugar diagnostic (ACCU-CHEK AVIVA PLUS TEST STRP) Strp by Other route Three (3) times a day before meals. 300 each 1   ??? blood sugar diagnostic Strp Use 1 strip 3 times a day with meter 100 each 11   ??? blood-glucose meter (ACCU-CHEK AVIVA PLUS METER) Misc Accu Chek Aviva Meter Use to test Blood sugar as directed E11.65 1 each 0   ??? cetirizine (ZYRTEC) 10 MG tablet Take 1 tablet (10 mg total) by mouth daily. 90 tablet 1   ??? chlorhexidine (PERIDEX) 0.12 % solution 15 mL by Oromucosal route Two (2) times a day. 473 mL 2   ??? CLINDAMYCIN HCL ORAL Take by mouth.     ??? clotrimazole-betamethasone (LOTRISONE) cream   0   ??? cyclobenzaprine (FLEXERIL) 10 MG tablet Take 1 tablet (10 mg total) by mouth Three (3) times a day as needed for muscle spasms. 30 tablet 1   ??? dextroamphetamine-amphetamine (ADDERALL XR) 20 MG 24 hr capsule Take 20 mg by mouth two (2) times a day.     ??? diclofenac (VOLTAREN) 50 MG EC tablet Take 1 tablet (50 mg total) by mouth two (2) times a day as  needed. 60 each 2   ??? dicyclomine (BENTYL) 10 mg capsule Take 1 capsule (10 mg total) by mouth Three (3) times a day. 90 capsule 5   ??? diphenoxylate-atropine (LOMOTIL) 2.5-0.025 mg per tablet Take 1 tablet by mouth two (2) times a day as needed for diarrhea. 30 tablet 11   ??? doxycycline (ADOXA) 100 MG tablet Take 1 tablet (100 mg total) by mouth daily. Take with food and full glass of water. Don't lie down for 30 min after taking. 90 tablet 0   ??? DULoxetine (CYMBALTA) 60 MG capsule Take 60 mg by mouth.     ??? empty container Misc Use as directed to dispose of Humira injections 1 each 2   ??? erenumab-aooe (AIMOVIG AUTOINJECTOR) 70 mg/mL AtIn Inject 1 mL under the skin every twenty-eight (28) days. 1 Syringe 3   ??? ergocalciferol (DRISDOL) 1,250 mcg (50,000 unit) capsule Take 1 capsule (50,000 Units total) by mouth once a week. 4 capsule 2   ??? fluticasone (FLONASE) 50 mcg/actuation nasal spray 1 spray each nare daily 16 g 5   ??? gabapentin (NEURONTIN) 300 MG capsule Take 300 mg by mouth Three (3) times a day.     ??? glipiZIDE (GLUCOTROL) 10 MG tablet Take 2 tablets (20 mg total) by mouth Two (2) times a day (30 minutes before a meal). 360 tablet 1   ??? HUMIRA PEN CITRATE FREE 40 MG/0.4 ML Inject the contents of 1 pen (40 mg total) under the skin every seven (7) days. 4 each 3   ??? HYDROcodone-acetaminophen (NORCO 10-325) 10-325 mg per tablet Take 1 tablet by mouth every eight (8) hours as needed for pain. DO NOT FILL UNTIL 10/17/2018. 90 tablet 0   ??? hydrocortisone (ANUSOL-HC) 25 mg suppository Insert 1 suppository (25 mg total) into the rectum Two (2) times a day. 24 each 1   ??? hydrocortisone (PROCTOSOL HC) 2.5 % rectal cream Insert 1 application into the rectum two (2) times a day as needed. 28.35 g 2   ??? ibuprofen (ADVIL,MOTRIN) 800 MG tablet Take 1 tablet (800 mg total) by mouth every eight (8) hours as needed. 90 tablet 1   ??? insulin glargine (LANTUS U-100 INSULIN) 100 unit/mL injection Inject 0.8 mL (80 Units total) under the skin nightly. 72 mL 1   ??? insulin regular (HUMULIN R REGULAR U-100 INSULN) 100 unit/mL injection SLIDING SCALE BS 0-150= 0 UNITS 150-200= 3 UNITS, 200-250= 6 UNITS, 250-300= 9 UNITS, 300-350= 12 UNITS 40 mL 1   ??? insulin syringe-needle U-100 (BD INSULIN SYRINGE ULTRA-FINE) 1 mL 30 gauge x 1/2 Syrg USE AS DIRECTED. WITH LANTUS 100 each 1   ??? lancets (ACCU-CHEK SOFTCLIX LANCETS) Misc Inject 1 each under the skin Three (3) times a day. 300 each 11   ??? levothyroxine (SYNTHROID) 50 MCG tablet Take 1 tablet (50 mcg total) by mouth daily. 90 tablet 1   ??? lidocaine (XYLOCAINE) 5 % ointment Apply to painful areas in the groin TID as needed for pain 120 g 3   ??? loperamide (IMODIUM) 2 mg capsule Take 2 mg by mouth 4 (four) times a day as needed for diarrhea.     ??? MITIGARE 0.6 mg cap capsule Take 1.2 mg on Day 1 of flare, followed by 0.6 mg one hour later. Day 2 take 0.6 mg BID until flare resolves. 60 capsule 3   ??? montelukast (SINGULAIR) 10 mg tablet Take 1 tablet (10 mg total) by mouth daily. 90 each 1   ???  naproxen (NAPROSYN) 500 MG tablet Take 1 tablet (500 mg total) by mouth 2 (two) times a day with meals. 60 each 2   ??? nystatin (MYCOSTATIN) 100,000 unit/gram powder Apply to affected area 3 times daily 60 g 3   ??? ondansetron (ZOFRAN) 4 MG tablet Take 1-2 tablets (4-8 mg total) by mouth every twelve (12) hours as needed for nausea. 30 each 2   ??? oxybutynin (DITROPAN) 5 MG tablet Take 1 tablet (5 mg total) by mouth Two (2) times a day. 90 tablet 3   ??? oxyCODONE (ROXICODONE) 5 MG immediate release tablet Take 1 tablet (5 mg total) by mouth every four (4) hours as needed for pain. for up to 12 doses 12 tablet 0   ??? pantoprazole (PROTONIX) 40 MG tablet Take 1 tablet (40 mg total) by mouth daily at 0600. 90 tablet 3   ??? PROAIR HFA 90 mcg/actuation inhaler Inhale 2 puffs every four (4) hours as needed for wheezing. 3 Inhaler 1   ??? promethazine (PHENERGAN) 25 MG tablet Take 1-2 tablets (25-50 mg total) by mouth nightly as needed for nausea. 30 each 2   ??? propranoloL (INDERAL LA) 120 mg 24 hr capsule Take 1 capsule (120 mg total) by mouth daily. 90 capsule 1   ??? topiramate (TOPAMAX) 25 MG tablet Take 1 tablet (25 mg total) by mouth Two (2) times a day. 180 tablet 1   ??? topiramate (TOPAMAX) 50 MG tablet Take 1 tablet (50 mg total) by mouth Two (2) times a day. 180 tablet 1   ??? traZODone (DESYREL) 100 MG tablet take 2 tablets by mouth at bedtime  0   ??? albuterol 2.5 mg /3 mL (0.083 %) nebulizer solution Inhale 3 mL (2.5 mg total) by nebulization every four (4) hours as needed for wheezing. 25 vial 2   ??? cimetidine (TAGAMET) 200 MG tablet Take 1 tablet (200 mg total) by mouth Two (2) times a day. 180 tablet 3   ??? fluconazole (DIFLUCAN) 150 MG tablet Take 1 tablet (150 mg total) by mouth once for 1 dose. Repeat 150 mg in one week. 2 tablet 0     No current facility-administered medications for this visit.        Health Maintenance:     Health Maintenance Summary w/Most Recent Date       Status Date      Retinal Eye Exam Overdue 09/18/2018      Done 09/17/2017 SmartData: OPHTH FUNDUS OD PERIPHERY     Done 09/17/2017 SmartData: OPHTH FUNDUS OS PERIPHERY     Done 09/17/2017 SmartData: FINDINGS - PE - EYES - FUNDUSCOPIC - PERIPHERY - RIGHT PERIPHERY NORMAL     Done 09/30/2016 SmartData: OPHTH FUNDUS OD PERIPHERY     Done 09/30/2016 SmartData: OPHTH FUNDUS OS PERIPHERY     Patient has more history with this topic...    Foot Exam Overdue 10/07/2018      Done 10/06/2017 [facility] DIABETES FOOT EXAM     Done 09/13/2016 [facility] DIABETES FOOT EXAM     Done 08/07/2015 [facility] DIABETES FOOT EXAM    Hemoglobin A1c Next Due 01/21/2019      Done 10/21/2018 Registry Metric: Last Hemoglobin A1c Date     Done 07/08/2018 POCT GLYCOSYLATED HEMOGLOBIN (HGB A1C) HGB A1C, RAP/PDS           Done 01/05/2018 POCT GLYCOSYLATED HEMOGLOBIN (HGB A1C) HGB A1C, RAP/PDS           Done 10/06/2017 POCT GLYCOSYLATED HEMOGLOBIN (HGB A1C)  HGB A1C, RAP/PDS           Done 06/30/2017 POCT GLYCOSYLATED HEMOGLOBIN (HGB A1C) HGB A1C, RAP/PDS           Patient has more history with this topic...    Serum Creatinine Monitoring Next Due 04/08/2019      Done 04/07/2018 COMPREHENSIVE METABOLIC PANEL Creatinine           Done 04/20/2017 BASIC METABOLIC PANEL Creatinine           Done 11/10/2016 COMPREHENSIVE METABOLIC PANEL Creatinine           Done 06/23/2016 BASIC METABOLIC PANEL Creatinine           Done 06/22/2016 BASIC METABOLIC PANEL Creatinine           Patient has more history with this topic...    Potassium Monitoring Next Due 04/08/2019      Done 04/07/2018 COMPREHENSIVE METABOLIC PANEL Potassium           Done 04/20/2017 BASIC METABOLIC PANEL Potassium           Done 11/10/2016 COMPREHENSIVE METABOLIC PANEL Potassium           Done 06/23/2016 BASIC METABOLIC PANEL Potassium           Done 06/22/2016 BASIC METABOLIC PANEL Potassium           Patient has more history with this topic...    Urine Albumin/Creatinine Ratio Next Due 07/09/2019      Done 07/08/2018 Registry Metric: [facility] DM AMB LAST URINE MICROALBUMIN TO CREATININE RATIO     Done 07/08/2018 ALBUMIN / CREATININE URINE RATIO Albumin/Creatinine Ratio           Done 06/30/2017 ALBUMIN / CREATININE URINE RATIO Albumin/Creatinine Ratio           Done 05/24/2016 ALBUMIN / CREATININE URINE RATIO Albumin/Creatinine Ratio           Done 03/24/2015 ALBUMIN / CREATININE URINE RATIO Albumin/Creatinine Ratio           Patient has more history with this topic...    DTaP/Tdap/Td Vaccines Next Due 07/17/2023      Done 07/16/2013 Imm Admin: TdaP     Done 07/18/2008 Imm Admin: TdaP     Done 04/29/2000 Imm Admin: Tetanus and diptheria,(adult), adsorbed, 2Lf tetanus toxoid, PF    Pneumococcal Vaccine This plan is no longer active.      Done 12/30/2012 Imm Admin: PNEUMOCOCCAL POLYSACCHARIDE 23    Influenza Vaccine This plan is no longer active.      Done 02/23/2018 Imm Admin: Influenza Vaccine Quad (IIV4 PF) 24mo+ injectable     Done 02/23/2018 Imm Admin: Influenza Virus Vaccine, unspecified formulation     Done 03/19/2017 Imm Admin: Influenza Vaccine Quad (IIV4 PF) 73mo+ injectable     Done 03/19/2017 Imm Admin: Influenza Virus Vaccine, unspecified formulation     Done 03/05/2016 Imm Admin: Influenza Vaccine Quad (IIV4 PF) 45mo+ injectable     Patient has more history with this topic...          Immunizations:     Immunization History   Administered Date(s) Administered   ??? Hepatitis B Vaccine, Unspecified Formulation 12/27/1999, 04/29/2000   ??? Hepatitis B, Adult 03/05/2013, 04/05/2013, 09/03/2013   ??? INFLUENZA TIV (TRI) PF (IM) 10/08/2011   ??? Influenza Vaccine Quad (IIV4 PF) 74mo+ injectable 03/05/2013, 03/18/2014, 03/22/2015, 03/05/2016, 03/19/2017, 02/23/2018   ??? Influenza Virus Vaccine, unspecified formulation 03/19/2017, 02/23/2018   ??? PNEUMOCOCCAL POLYSACCHARIDE 23 12/30/2012   ???  PPD Test 10/28/2016   ??? TdaP 07/18/2008, 07/16/2013   ??? Tetanus and diptheria,(adult), adsorbed, 2Lf tetanus toxoid, PF 04/29/2000       I have reviewed and (if needed) updated the patient's problem list, medications, allergies, past medical and surgical history, social and family history.    ROS:      ROS  Comprehensive 10 point ROS negative unless otherwise stated in the HPI.       Vital Signs:     Wt Readings from Last 3 Encounters:   10/21/18 (!) 226.5 kg (499 lb 6.4 oz)   07/22/18 (!) 219.1 kg (483 lb)   07/08/18 (!) 215.8 kg (475 lb 12.8 oz)     Temp Readings from Last 3 Encounters:   10/21/18 36.8 ??C (98.3 ??F) (Oral)   04/22/18 37.1 ??C (98.7 ??F) (Temporal)   06/30/17 36.4 ??C (97.6 ??F) (Oral)     BP Readings from Last 3 Encounters:   10/21/18 118/80   07/22/18 110/80   07/08/18 120/88     Pulse Readings from Last 3 Encounters:   10/21/18 65   07/22/18 66   07/08/18 73     Estimated body mass index is 71.66 kg/m?? as calculated from the following:    Height as of 04/22/18: 177.8 cm (5' 10).    Weight as of this encounter: 226.5 kg (499 lb 6.4 oz).  Facility age limit for growth percentiles is 20 years.        Objective:      General: Alert and oriented x3. Well-appearing. No acute distress.   HEENT:  Normocephalic.  Atraumatic. Conjunctiva and sclera normal. OP MMM without lesions.   Neck:  Supple. No thyroid enlargement. No adenopathy.   Heart:  Regular rate and rhythm . Normal S1, S2.  No murmurs, rubs or gallops.   Lungs:  No respiratory distress.  Lungs clear to auscultation. No wheezes, rhonchi, or rales.   GI/GU:  Soft, +BS, nondistended, morbidly obese, non-TTP. No palpable masses or organomegaly.    Extremities:  Peripheral pulses normal. Bilateral pedal edema and lymphedema (L>R). Left heel with callused skin.  Skin:  Warm, dry. No rash or lesions present.   Neuro:  Non-focal. No obvious weakness.   Psych:  Affect normal, eye contact good, speech clear and coherent.      I attest that I, Bayard Hugger, personally documented this note while acting as scribe for [name], FNP.      Bayard Hugger, Scribe.  10/21/2018     The documentation recorded by the scribe accurately reflects the service I personally performed and the decisions made by me.    [name], FNP

## 2018-10-16 NOTE — Unmapped (Signed)
Your medication record shows you are already on pantoprazole 40 mg daily.

## 2018-10-16 NOTE — Unmapped (Signed)
Pharmacy called Pepcid not available , requesting alternative . Medicaid alternatives are esomeprazole, lansoprazole and pantoprazole

## 2018-10-17 MED ORDER — HYDROCODONE 10 MG-ACETAMINOPHEN 325 MG TABLET
ORAL_TABLET | Freq: Three times a day (TID) | ORAL | 0 refills | 0.00000 days | Status: CP | PRN
Start: 2018-10-17 — End: 2018-12-15

## 2018-10-19 ENCOUNTER — Encounter: Payer: Self-pay | Admitting: Podiatry

## 2018-10-19 ENCOUNTER — Other Ambulatory Visit: Payer: Self-pay

## 2018-10-19 ENCOUNTER — Ambulatory Visit (INDEPENDENT_AMBULATORY_CARE_PROVIDER_SITE_OTHER): Payer: Medicaid Other | Admitting: Podiatry

## 2018-10-19 VITALS — Temp 97.6°F

## 2018-10-19 DIAGNOSIS — E1142 Type 2 diabetes mellitus with diabetic polyneuropathy: Secondary | ICD-10-CM

## 2018-10-19 DIAGNOSIS — M79676 Pain in unspecified toe(s): Secondary | ICD-10-CM

## 2018-10-19 DIAGNOSIS — B351 Tinea unguium: Secondary | ICD-10-CM

## 2018-10-19 NOTE — Addendum Note (Signed)
Addended by: Geraldine Contras D on: 10/19/2018 11:17 AM   Modules accepted: Orders

## 2018-10-19 NOTE — Progress Notes (Signed)
Complaint:  Visit Type: Patient returns to my office for continued preventative foot care services. Complaint: Patient states" my nails have grown long and thick and become painful to walk and wear shoes" Patient has been diagnosed with DM with no foot complications. The patient presents for preventative foot care services. No changes to ROS  Podiatric Exam: Vascular: dorsalis pedis and posterior tibial pulses are palpable bilateral. Capillary return is immediate. Temperature gradient is WNL. Skin turgor WNL  Sensorium: Normal Semmes Weinstein monofilament test. Normal tactile sensation bilaterally. Nail Exam: Pt has thick disfigured discolored nails with subungual debris noted bilateral entire nail hallux through fifth toenails Ulcer Exam: There is no evidence of ulcer or pre-ulcerative changes or infection. Orthopedic Exam: Muscle tone and strength are WNL. No limitations in general ROM. No crepitus or effusions noted. Foot type and digits show no abnormalities. Bony prominences are unremarkable. Skin: No Porokeratosis. No infection or ulcers  Diagnosis:  Onychomycosis, , Pain in right toe, pain in left toes  Treatment & Plan Procedures and Treatment: Consent by patient was obtained for treatment procedures.   Debridement of mycotic and hypertrophic toenails, 1 through 5 bilateral and clearing of subungual debris. No ulceration, no infection noted.  Return Visit-Office Procedure: Patient instructed to return to the office for a follow up visit 3 months for continued evaluation and treatment.    Khyree Carillo DPM 

## 2018-10-20 MED ORDER — PROPRANOLOL ER 120 MG CAPSULE,24 HR,EXTENDED RELEASE
ORAL_CAPSULE | Freq: Every day | ORAL | 1 refills | 0.00000 days | Status: CP
Start: 2018-10-20 — End: 2019-01-06

## 2018-10-21 ENCOUNTER — Ambulatory Visit: Admit: 2018-10-21 | Discharge: 2018-10-22 | Payer: MEDICAID | Attending: Family | Primary: Family

## 2018-10-21 DIAGNOSIS — Z794 Long term (current) use of insulin: Secondary | ICD-10-CM

## 2018-10-21 DIAGNOSIS — K219 Gastro-esophageal reflux disease without esophagitis: Secondary | ICD-10-CM

## 2018-10-21 DIAGNOSIS — E1165 Type 2 diabetes mellitus with hyperglycemia: Principal | ICD-10-CM

## 2018-10-21 DIAGNOSIS — B356 Tinea cruris: Secondary | ICD-10-CM

## 2018-10-21 MED ORDER — NYSTATIN 100,000 UNIT/GRAM TOPICAL POWDER
3 refills | 0 days | Status: CP
Start: 2018-10-21 — End: 2019-01-06

## 2018-10-21 MED ORDER — CIMETIDINE 200 MG TABLET
ORAL_TABLET | Freq: Two times a day (BID) | ORAL | 3 refills | 0 days | Status: CP
Start: 2018-10-21 — End: 2019-01-06

## 2018-10-21 MED ORDER — FLUCONAZOLE 150 MG TABLET
ORAL_TABLET | Freq: Once | ORAL | 0 refills | 0.00000 days | Status: CP
Start: 2018-10-21 — End: 2018-10-21

## 2018-10-22 NOTE — Unmapped (Signed)
This message addressed during CV 10/21/18. Alternative Rx sent.

## 2018-10-30 NOTE — Unmapped (Signed)
Please schedule a video visit with Dr. Elder Cyphers as per her note below. Thank you.

## 2018-11-09 NOTE — Unmapped (Signed)
[facility] Clinical Assessment & Refill Coordination Note    Logan Quinn, DOB: [birth year]  Phone: [phone] (home)     All above HIPAA information was verified with patient.     Specialty Medication(s):   Inflammatory Disorders: Humira     Current Outpatient Medications   Medication Sig Dispense Refill   ??? ACCU-CHEK SOFTCLIX LANCETS lancets TEST three times a day 100 each 3   ??? acetaminophen (TYLENOL) 500 MG tablet Take 2 tablets (1,000 mg total) by mouth Three (3) times a day. 30 tablet 0   ??? acetaminophen-codeine (TYLENOL #3) 300-30 mg per tablet Take 1 tablet by mouth every six (6) hours as needed for pain. 60 tablet 0   ??? ADVAIR DISKUS 500-50 mcg/dose diskus Inhale 1 puff 2 (two) times a day. 13 Inhaler 1   ??? albuterol (PROAIR HFA) 90 mcg/actuation inhaler ProAir HFA 90 mcg/actuation aerosol inhaler     ??? albuterol 2.5 mg /3 mL (0.083 %) nebulizer solution Inhale 3 mL (2.5 mg total) by nebulization every four (4) hours as needed for wheezing. 25 vial 2   ??? ALPRAZolam (XANAX) 0.5 MG tablet Take 1 tablet (0.5 mg total) by mouth Three (3) times a day. (Patient taking differently: Take 1 mg by mouth Three (3) times a day. ) 60 tablet 0   ??? blood sugar diagnostic (ACCU-CHEK AVIVA PLUS TEST STRP) Strp by Other route Three (3) times a day before meals. 300 each 1   ??? blood sugar diagnostic Strp Use 1 strip 3 times a day with meter 100 each 11   ??? blood-glucose meter (ACCU-CHEK AVIVA PLUS METER) Misc Accu Chek Aviva Meter Use to test Blood sugar as directed E11.65 1 each 0   ??? cetirizine (ZYRTEC) 10 MG tablet Take 1 tablet (10 mg total) by mouth daily. 90 tablet 1   ??? chlorhexidine (PERIDEX) 0.12 % solution 15 mL by Oromucosal route Two (2) times a day. 473 mL 2   ??? cimetidine (TAGAMET) 200 MG tablet Take 1 tablet (200 mg total) by mouth Two (2) times a day. 180 tablet 3   ??? CLINDAMYCIN HCL ORAL Take by mouth.     ??? clotrimazole-betamethasone (LOTRISONE) cream   0   ??? cyclobenzaprine (FLEXERIL) 10 MG tablet Take 1 tablet (10 mg total) by mouth Three (3) times a day as needed for muscle spasms. 30 tablet 1   ??? dextroamphetamine-amphetamine (ADDERALL XR) 20 MG 24 hr capsule Take 20 mg by mouth two (2) times a day.     ??? diclofenac (VOLTAREN) 50 MG EC tablet Take 1 tablet (50 mg total) by mouth two (2) times a day as needed. 60 each 2   ??? dicyclomine (BENTYL) 10 mg capsule Take 1 capsule (10 mg total) by mouth Three (3) times a day. 90 capsule 5   ??? diphenoxylate-atropine (LOMOTIL) 2.5-0.025 mg per tablet Take 1 tablet by mouth two (2) times a day as needed for diarrhea. 30 tablet 11   ??? doxycycline (ADOXA) 100 MG tablet Take 1 tablet (100 mg total) by mouth daily. Take with food and full glass of water. Don't lie down for 30 min after taking. 90 tablet 0   ??? DULoxetine (CYMBALTA) 60 MG capsule Take 60 mg by mouth.     ??? empty container Misc Use as directed to dispose of Humira injections 1 each 2   ??? erenumab-aooe (AIMOVIG AUTOINJECTOR) 70 mg/mL AtIn Inject 1 mL under the skin every twenty-eight (28)  days. 1 Syringe 3   ??? ergocalciferol (DRISDOL) 1,250 mcg (50,000 unit) capsule Take 1 capsule (50,000 Units total) by mouth once a week. 4 capsule 2   ??? fluticasone (FLONASE) 50 mcg/actuation nasal spray 1 spray each nare daily 16 g 5   ??? gabapentin (NEURONTIN) 300 MG capsule Take 300 mg by mouth Three (3) times a day.     ??? glipiZIDE (GLUCOTROL) 10 MG tablet Take 2 tablets (20 mg total) by mouth Two (2) times a day (30 minutes before a meal). 360 tablet 1   ??? HUMIRA PEN CITRATE FREE 40 MG/0.4 ML Inject the contents of 1 pen (40 mg total) under the skin every seven (7) days. 4 each 3   ??? HYDROcodone-acetaminophen (NORCO 10-325) 10-325 mg per tablet Take 1 tablet by mouth every eight (8) hours as needed for pain. DO NOT FILL UNTIL 10/17/2018. 90 tablet 0   ??? hydrocortisone (ANUSOL-HC) 25 mg suppository Insert 1 suppository (25 mg total) into the rectum Two (2) times a day. 24 each 1   ??? hydrocortisone (PROCTOSOL HC) 2.5 % rectal cream Insert 1 application into the rectum two (2) times a day as needed. 28.35 g 2   ??? ibuprofen (ADVIL,MOTRIN) 800 MG tablet Take 1 tablet (800 mg total) by mouth every eight (8) hours as needed. 90 tablet 1   ??? insulin glargine (LANTUS U-100 INSULIN) 100 unit/mL injection Inject 0.8 mL (80 Units total) under the skin nightly. 72 mL 1   ??? insulin regular (HUMULIN R REGULAR U-100 INSULN) 100 unit/mL injection SLIDING SCALE BS 0-150= 0 UNITS 150-200= 3 UNITS, 200-250= 6 UNITS, 250-300= 9 UNITS, 300-350= 12 UNITS 40 mL 1   ??? insulin syringe-needle U-100 (BD INSULIN SYRINGE ULTRA-FINE) 1 mL 30 gauge x 1/2 Syrg USE AS DIRECTED. WITH LANTUS 100 each 1   ??? lancets (ACCU-CHEK SOFTCLIX LANCETS) Misc Inject 1 each under the skin Three (3) times a day. 300 each 11   ??? levothyroxine (SYNTHROID) 50 MCG tablet Take 1 tablet (50 mcg total) by mouth daily. 90 tablet 1   ??? lidocaine (XYLOCAINE) 5 % ointment Apply to painful areas in the groin TID as needed for pain 120 g 3   ??? loperamide (IMODIUM) 2 mg capsule Take 2 mg by mouth 4 (four) times a day as needed for diarrhea.     ??? MITIGARE 0.6 mg cap capsule Take 1.2 mg on Day 1 of flare, followed by 0.6 mg one hour later. Day 2 take 0.6 mg BID until flare resolves. 60 capsule 3   ??? montelukast (SINGULAIR) 10 mg tablet Take 1 tablet (10 mg total) by mouth daily. 90 each 1   ??? naproxen (NAPROSYN) 500 MG tablet Take 1 tablet (500 mg total) by mouth 2 (two) times a day with meals. 60 each 2   ??? nystatin (MYCOSTATIN) 100,000 unit/gram powder Apply to affected area 3 times daily 60 g 3   ??? ondansetron (ZOFRAN) 4 MG tablet Take 1-2 tablets (4-8 mg total) by mouth every twelve (12) hours as needed for nausea. 30 each 2   ??? oxybutynin (DITROPAN) 5 MG tablet Take 1 tablet (5 mg total) by mouth Two (2) times a day. 90 tablet 3   ??? oxyCODONE (ROXICODONE) 5 MG immediate release tablet Take 1 tablet (5 mg total) by mouth every four (4) hours as needed for pain. for up to 12 doses 12 tablet 0   ??? pantoprazole (PROTONIX) 40 MG tablet Take 1 tablet (40 mg total)  by mouth daily at 0600. 90 tablet 3   ??? PROAIR HFA 90 mcg/actuation inhaler Inhale 2 puffs every four (4) hours as needed for wheezing. 3 Inhaler 1   ??? promethazine (PHENERGAN) 25 MG tablet Take 1-2 tablets (25-50 mg total) by mouth nightly as needed for nausea. 30 each 2   ??? propranoloL (INDERAL LA) 120 mg 24 hr capsule Take 1 capsule (120 mg total) by mouth daily. 90 capsule 1   ??? topiramate (TOPAMAX) 25 MG tablet Take 1 tablet (25 mg total) by mouth Two (2) times a day. 180 tablet 1   ??? topiramate (TOPAMAX) 50 MG tablet Take 1 tablet (50 mg total) by mouth Two (2) times a day. 180 tablet 1   ??? traZODone (DESYREL) 100 MG tablet take 2 tablets by mouth at bedtime  0     No current facility-administered medications for this visit.         Changes to medications: Trayvond reports no changes at this time.    Allergies   Allergen Reactions   ??? Erythromycin Nausea And Vomiting     Other reaction(s): Vomiting   ??? Penicillins Rash, Swelling and Hives   ??? Sulfa (Sulfonamide Antibiotics) Nausea And Vomiting and Nausea Only   ??? Other    ??? Erythromycin Base Rash   ??? Sulfasalazine Nausea And Vomiting       Changes to allergies: No    SPECIALTY MEDICATION ADHERENCE     Humira 40 mg/ml: 0 days of medicine on hand     Specialty medication(s) dose(s) confirmed: Regimen is correct and unchanged.     Are there any concerns with adherence? No    Adherence counseling provided? Not needed    CLINICAL MANAGEMENT AND INTERVENTION      Clinical Benefit Assessment:    Do you feel the medicine is effective or helping your condition? Is helping some. Still feels is having issues with a few areas  Appointment with provider is scheduled for 12/04/18    Clinical Benefit counseling provided? Progress note from 09/10/18 shows evidence of clinical benefit    Adverse Effects Assessment:    Are you experiencing any side effects? No    Are you experiencing difficulty administering your medicine? No    Quality of Life Assessment:    How many days over the past month did your hidradenitis suppurativa  keep you from your normal activities? For example, brushing your teeth or getting up in the morning. 0    Have you discussed this with your provider? Not needed    Therapy Appropriateness:    Is therapy appropriate? Yes, therapy is appropriate and should be continued    DISEASE/MEDICATION-SPECIFIC INFORMATION      For patients on injectable medications: Patient currently has 0 doses left.  Next injection is scheduled for 11/12/18.    PATIENT SPECIFIC NEEDS     ? Does the patient have any physical, cognitive, or cultural barriers? No    ? Is the patient high risk? No     ? Does the patient require a Care Management Plan? No     ? Does the patient require physician intervention or other additional services (i.e. nutrition, smoking cessation, social work)? No      SHIPPING     Specialty Medication(s) to be Shipped:   Inflammatory Disorders: Humira    Other medication(s) to be shipped: sharps container     Changes to insurance: No    Delivery Scheduled: Yes, Expected medication delivery date: 11/12/18.  Medication will be delivered via UPS to the confirmed home address in [facility].    The patient will receive a drug information handout for each medication shipped and additional FDA Medication Guides as required.  Verified that patient has previously received a Conservation officer, historic buildings.    All of the patient's questions and concerns have been addressed.    Breck Coons [facility] Specialty Pharmacist

## 2018-11-10 MED FILL — HUMIRA PEN CITRATE FREE 40 MG/0.4 ML: 28 days supply | Qty: 4 | Fill #1 | Status: AC

## 2018-11-10 MED FILL — HUMIRA PEN CITRATE FREE 40 MG/0.4 ML: SUBCUTANEOUS | 28 days supply | Qty: 4 | Fill #1

## 2018-11-10 MED FILL — EMPTY CONTAINER: 120 days supply | Qty: 1 | Fill #2

## 2018-11-10 MED FILL — EMPTY CONTAINER: 120 days supply | Qty: 1 | Fill #2 | Status: AC

## 2018-11-10 NOTE — Unmapped (Signed)
06.02.2020     DME [facility]     05.28.2020  Task completed by Melany Guernsey   Faxed CPAP Rx to DME   Copy scanned into media epic     Logan Quinn

## 2018-11-15 MED ORDER — INSULIN SYRINGE U-100 WITH NEEDLE 1 ML 31 GAUGE X 5/16" (8 MM)
1 refills | 0 days | Status: CP
Start: 2018-11-15 — End: 2019-01-06

## 2018-11-20 MED ORDER — CEPHALEXIN 500 MG CAPSULE
ORAL_CAPSULE | Freq: Two times a day (BID) | ORAL | 0 refills | 0.00000 days | Status: CP
Start: 2018-11-20 — End: 2018-12-04

## 2018-11-20 NOTE — Unmapped (Signed)
S/w patient regarding symptoms. Plan to pursue up titration of humira to 80mg  weekly given flaring on antibiotics. Advised to d/c doxycycline. Start Keflex (has tolerated this well in past with regards to PCN allergy) 500mg  BID since this has been helpful for him for HS in past.     Video visit with me next week.   All patient questions answered.   Serita Kyle, MD

## 2018-11-22 MED ORDER — INSULIN U-100 REGULAR HUMAN 100 UNIT/ML INJECTION SOLUTION
1 refills | 0 days | Status: CP
Start: 2018-11-22 — End: 2019-01-06

## 2018-11-24 ENCOUNTER — Telehealth: Admit: 2018-11-24 | Discharge: 2018-11-25 | Payer: MEDICAID | Attending: Dermatology | Primary: Dermatology

## 2018-11-24 DIAGNOSIS — L732 Hidradenitis suppurativa: Principal | ICD-10-CM

## 2018-11-24 NOTE — Unmapped (Signed)
Tried x 3 to call no answer left messages called this morning due to phone system done yesterday

## 2018-11-24 NOTE — Unmapped (Signed)
Teledermatology Note- Return Patient   11/24/2018      Encounter Description/Consent: This encounter was conducted from [facility]  via live, face-to-face video conference with the patient. Krish D Shlomo Seres was located in [place].  The patient verified his identity with his date of birth and verbally consented to evaluation and management of his condition through telemedicine.     Total time spent reviewing chart, uploaded images, and discussion with patient: 15 minutes    I spent 10 minutes on the audio/video with the patient. I spent an additional 5 minutes on pre- and post-visit activities.     The patient was physically located in [place] or a state in which I am permitted to provide care. The patient and/or parent/guardian understood that s/he may incur co-pays and cost sharing, and agreed to the telemedicine visit. The visit was reasonable and appropriate under the circumstances given the patient's presentation at the time.    The patient and/or parent/guardian has been advised of the potential risks and limitations of this mode of treatment (including, but not limited to, the absence of in-person examination) and has agreed to be treated using telemedicine. The patient's/patient's family's questions regarding telemedicine have been answered.     If the phone/video visit was completed in an ambulatory setting, the patient and/or parent/guardian has also been advised to contact their provider???s office for worsening conditions, and seek emergency medical treatment and/or call 911 if the patient deems either necessary.        NOTE: Telemedicine enables health care providers at different locations to provide safe, effective, and convenient care through the use of technology. As with any health care service, there are risks associated with the use of telemedicine and teledermatology, including lack of visualization,and there may be instances where the patient needs to be seen is person  This visit was performed via telemedicine during the COVID-19 Health Crisis during a State of National Emergency. The impression and recommendations were made on the basis of the history obtained via phone or video connection  and the physical exam reviewed electronically, which is limited by/to the uploaded images and their intrinsic quality.  All questions were answered, and the patient agreed to proceed.    Impression/ Plan:    Hidradenitis suppurativa - hurley stage II, flaring on humira 80mg  daily and doxycycline   - cannot tolerate metformin due to nausea/Gi upset   - currently flare improving on keflex 500mg  BID   - plan to uptitrate to humira 80mg  weekly. R/B/A reviewed including increased risk of infection (reviewed unclear association with risk of acquiring COVID 19/CoVID 19 complications and counseled on importance of following most up to date recommendations on lowering risk of infection)   HUMIRA PEN CITRATE FREE 40 MG/0.4 ML; Inject the contents of 2 pens (80mg  total) under the skin every seven (7) days.  - continue clindamycin lotion   - can consider unroofing procedure in future     - The patient was also advised to send a MyChart message or call for an appointment should any new, changing, or symptomatic lesions/rash develop in the interim.     RV: 3 m      ________________________________________________________________________________________________________    Ref Provider: Loran Senters    Patient interviewed in a private place    CC/HPI:    Caralyn Guile Josemanuel Eakins is a friendly 38 y.o. male , a Return  patient for a ESTABLISHED problem, evaluated today in a teledermatology encounter for: HS hurley stage II  in groin/thighs/sub pannus       Last derm visit: 09/10/2018  At that visit: Continued on humira 40mg  weekly and doxycycline with plan to taper down to 100mg  doxycycline daily. In interim, continued to have flares on lower doxycycline so was started on keflex and d/c doxcycline. Has been unable to tolerate metformin in past due to GI upset. Avoiding methotrexate due to likely metabolic syndrome/fatty liver.     Just started keflex 500mg  BID. Humira weekly. Not sure if it has improved that much but tolerating keflex well.       Pertinent PMH/FH/SH/Allergies, Medications:      Reviewed in Epic    ROS:     No fevers, chills, or other skin complaints.    PE:    General: The patient sits comfortably in view of the camera.  The patient's breathing is observed to be comfortable and normal.  The patient is in no acute distress.  All extremity movements appear intact.  There are no focal neurological deficits observed.   Neuro: Alert, answering questions appropriately.    Skin: Images of the abdomen were uploaded by patient and evaluated via the electronic medical record. Findings were normal with exception of the following:  - multiple non draining inflammatory nodules     - All other areas examined via photos/video were normal or had no significant findings          Venia Carbon, MD  11/24/2018

## 2018-12-01 MED ORDER — HUMIRA PEN CITRATE FREE 40 MG/0.4 ML
SUBCUTANEOUS | 3 refills | 0.00000 days | Status: CP
Start: 2018-12-01 — End: ?
  Filled 2018-12-07: qty 8, 28d supply, fill #0

## 2018-12-01 NOTE — Unmapped (Signed)
Called patient for refill coordination and he states that at last appointment DR. Vedak stated she was increasing his Humira dose to 2 pens q 7 days. Her 6/16 note does reflect this however we need a new prescription to reflect this as well. Sending message to Dr. Elder Cyphers and I will call patient to schedule once response is given.

## 2018-12-02 NOTE — Unmapped (Signed)
Per test claim for HUMIRA (DOSE INCREASE) at the [facility], patient needs Medication Assistance Program for Prior Authorization.

## 2018-12-03 NOTE — Unmapped (Signed)
[facility] Specialty Medication Referral: [place] Approved      Medication (Brand/Generic): HUMIRA (DOSE INCREASE)    Final Test Claim completed with resulted information below:    Patient ABLE to fill at [facility] Company:  [place] MEDICAID  Anticipated Copay: $3 PER REFERRAL  Is anticipated copay with a copay card or grant? No, there is no need for grant or copay assistance.     Does this patient have to receive a partial fill of the medication due to insurance restrictions? NO  If so, please cofirm how many days supply is allowed per plan per fill and how long the patient will have to fill partial months supply for the medication: NOT APPLICABLE     If the copay is under the $25 defined limit, per policy there will be no further investigation of need for financial assistance at this time unless patient requests. This referral has been communicated to the provider and handed off to the [facility] team for further processing and filling of prescribed medication.   ______________________________________________________________________  Please utilize this referral for viewing purposes as it will serve as the central location for all relevant documentation and updates.

## 2018-12-04 NOTE — Unmapped (Signed)
I spoke with Logan Quinn -he's aware of increased dose. I advised he could go ahead and use his last pen today (he gave 40 mg yesterday, so this would equal 80 mg this week). He didn't have any questions or concerns.     [facility] Refill Coordination Note    Specialty Medication(s) to be Shipped:   Inflammatory Disorders: Humira    Other medication(s) to be shipped: na     Logan Quinn, DOB: 11-22-1980  Phone: [phone] (home)       All above HIPAA information was verified with patient.     Completed refill call assessment today to schedule patient's medication shipment from the [facility] [phone]).       Specialty medication(s) and dose(s) confirmed: dose increase   Changes to medications: Logan Quinn reports no changes at this time.  Changes to insurance: No  Questions for the pharmacist: No    Confirmed patient received Welcome Packet with first shipment. The patient will receive Logan drug information handout for each medication shipped and additional FDA Medication Guides as required.       DISEASE/MEDICATION-SPECIFIC INFORMATION        For patients on injectable medications: Patient currently has 1 doses left.  Next injection is scheduled for today (see above).    SPECIALTY MEDICATION ADHERENCE              Humira - one 40 mg pen (1/2 of new dose) remaining      SHIPPING     Shipping address confirmed in Epic.     Delivery Scheduled: Yes, Expected medication delivery date: Monday, June 29.     Medication will be delivered via Same Day Courier to the home address in Epic [place].    Logan Quinn Logan Quinn [facility] Specialty Pharmacist

## 2018-12-07 MED FILL — HUMIRA PEN CITRATE FREE 40 MG/0.4 ML: 28 days supply | Qty: 8 | Fill #0 | Status: AC

## 2018-12-10 NOTE — Unmapped (Signed)
P/C requesting refill on Topamax , last ordered on 11/18/17, last visit 10/21/2018.

## 2018-12-14 MED ORDER — TOPIRAMATE 25 MG TABLET
ORAL_TABLET | Freq: Two times a day (BID) | ORAL | 1 refills | 0 days | Status: CP
Start: 2018-12-14 — End: 2019-01-06

## 2018-12-15 MED ORDER — HYDROCODONE 10 MG-ACETAMINOPHEN 325 MG TABLET
ORAL_TABLET | Freq: Three times a day (TID) | ORAL | 0 refills | 0.00000 days | Status: CP | PRN
Start: 2018-12-15 — End: 2019-01-14

## 2018-12-15 NOTE — Unmapped (Signed)
Not sure what he is asking for

## 2018-12-23 MED ORDER — LANTUS U-100 INSULIN 100 UNIT/ML SUBCUTANEOUS SOLUTION
Freq: Every evening | SUBCUTANEOUS | 1 refills | 90 days | Status: CP
Start: 2018-12-23 — End: 2019-01-06

## 2018-12-31 NOTE — Unmapped (Signed)
[facility] Refill Coordination Note    Specialty Medication(s) to be Shipped:   Inflammatory Disorders: Humira    Other medication(s) to be shipped:       Logan Quinn, DOB: [birth year]  Phone: [phone] (home)       All above HIPAA information was verified with patient.     Completed refill call assessment today to schedule patient's medication shipment from the [facility] [phone]).       Specialty medication(s) and dose(s) confirmed: Regimen is correct and unchanged.   Changes to medications: Massey reports no changes at this time.  Changes to insurance: No  Questions for the pharmacist: No    Confirmed patient received Welcome Packet with first shipment. The patient will receive a drug information handout for each medication shipped and additional FDA Medication Guides as required.       DISEASE/MEDICATION-SPECIFIC INFORMATION        N/A    SPECIALTY MEDICATION ADHERENCE     Medication Adherence    Patient reported X missed doses in the last month: 0  Specialty Medication: humira cf 40 mg/0.4 ml   Patient is on additional specialty medications: No  Patient is on more than two specialty medications: No                Humira 40/0.4 mg/ml: 7 days of medicine on hand       SHIPPING     Shipping address confirmed in Epic.     Delivery Scheduled: Yes, Expected medication delivery date: 07/28.     Medication will be delivered via UPS to the home address in Epic [place].    Antonietta Barcelona   [facility] Specialty Technician

## 2019-01-04 MED ORDER — CLOTRIMAZOLE-BETAMETHASONE 1 %-0.05 % TOPICAL CREAM
Freq: Two times a day (BID) | TOPICAL | 1 refills | 0 days | Status: CP
Start: 2019-01-04 — End: 2019-01-06

## 2019-01-04 MED FILL — HUMIRA PEN CITRATE FREE 40 MG/0.4 ML: 28 days supply | Qty: 8 | Fill #1

## 2019-01-04 MED FILL — HUMIRA PEN CITRATE FREE 40 MG/0.4 ML: 28 days supply | Qty: 8 | Fill #1 | Status: AC

## 2019-01-06 MED ORDER — TOPIRAMATE 25 MG TABLET
ORAL_TABLET | Freq: Two times a day (BID) | ORAL | 1 refills | 90 days | Status: CP
Start: 2019-01-06 — End: 2020-01-06

## 2019-01-06 MED ORDER — AIMOVIG AUTOINJECTOR 70 MG/ML SUBCUTANEOUS AUTO-INJECTOR
INJECTION | SUBCUTANEOUS | 1 refills | 0 days | Status: CP
Start: 2019-01-06 — End: 2020-01-06

## 2019-01-06 MED ORDER — ALBUTEROL SULFATE HFA 90 MCG/ACTUATION AEROSOL INHALER
Freq: Four times a day (QID) | RESPIRATORY_TRACT | 1 refills | 0 days | Status: CP | PRN
Start: 2019-01-06 — End: 2020-01-06

## 2019-01-06 MED ORDER — CLOTRIMAZOLE-BETAMETHASONE 1 %-0.05 % TOPICAL CREAM
Freq: Two times a day (BID) | TOPICAL | 1 refills | 0.00000 days | Status: CP
Start: 2019-01-06 — End: 2020-01-06

## 2019-01-06 MED ORDER — INSULIN SYRINGE U-100 WITH NEEDLE 1 ML 31 GAUGE X 5/16" (8 MM)
1 refills | 0 days | Status: CP
Start: 2019-01-06 — End: 2020-01-06

## 2019-01-06 MED ORDER — PROPRANOLOL ER 120 MG CAPSULE,24 HR,EXTENDED RELEASE
ORAL_CAPSULE | Freq: Every day | ORAL | 1 refills | 90 days | Status: CP
Start: 2019-01-06 — End: 2020-01-06

## 2019-01-06 MED ORDER — ERGOCALCIFEROL (VITAMIN D2) 1,250 MCG (50,000 UNIT) CAPSULE
ORAL_CAPSULE | ORAL | 2 refills | 28 days | Status: CP
Start: 2019-01-06 — End: 2020-01-06

## 2019-01-06 MED ORDER — LANTUS U-100 INSULIN 100 UNIT/ML SUBCUTANEOUS SOLUTION
Freq: Every evening | SUBCUTANEOUS | 1 refills | 90 days | Status: CP
Start: 2019-01-06 — End: 2020-01-06

## 2019-01-06 MED ORDER — LEVOTHYROXINE 50 MCG TABLET
ORAL_TABLET | Freq: Every day | ORAL | 1 refills | 90 days | Status: CP
Start: 2019-01-06 — End: 2020-01-06

## 2019-01-06 MED ORDER — MITIGARE 0.6 MG CAPSULE
ORAL_CAPSULE | 3 refills | 0 days | Status: CP
Start: 2019-01-06 — End: 2020-01-06

## 2019-01-06 MED ORDER — FLUTICASONE PROPIONATE 50 MCG/ACTUATION NASAL SPRAY,SUSPENSION
3 refills | 0 days | Status: CP
Start: 2019-01-06 — End: 2020-01-06

## 2019-01-06 MED ORDER — DICYCLOMINE 10 MG CAPSULE
ORAL_CAPSULE | Freq: Three times a day (TID) | ORAL | 1 refills | 90.00000 days | Status: CP
Start: 2019-01-06 — End: 2020-01-06

## 2019-01-06 MED ORDER — CIMETIDINE 200 MG TABLET
ORAL_TABLET | Freq: Two times a day (BID) | ORAL | 1 refills | 90.00000 days | Status: CP
Start: 2019-01-06 — End: 2020-01-06

## 2019-01-06 MED ORDER — MONTELUKAST 10 MG TABLET
Freq: Every day | ORAL | 1 refills | 90.00000 days | Status: CP
Start: 2019-01-06 — End: 2020-01-07

## 2019-01-06 MED ORDER — ALBUTEROL SULFATE 2.5 MG/3 ML (0.083 %) SOLUTION FOR NEBULIZATION
RESPIRATORY_TRACT | 2 refills | 5.00000 days | Status: CP | PRN
Start: 2019-01-06 — End: 2020-01-06

## 2019-01-06 MED ORDER — DIPHENOXYLATE-ATROPINE 2.5 MG-0.025 MG TABLET
ORAL_TABLET | Freq: Two times a day (BID) | ORAL | 5 refills | 15.00000 days | Status: CP | PRN
Start: 2019-01-06 — End: ?

## 2019-01-06 MED ORDER — PROAIR HFA 90 MCG/ACTUATION AEROSOL INHALER
RESPIRATORY_TRACT | 1 refills | 0 days | Status: CP | PRN
Start: 2019-01-06 — End: 2020-01-07

## 2019-01-06 MED ORDER — ADVAIR DISKUS 500 MCG-50 MCG/DOSE POWDER FOR INHALATION
Freq: Two times a day (BID) | RESPIRATORY_TRACT | 1 refills | 91 days
Start: 2019-01-06 — End: 2020-01-07

## 2019-01-06 MED ORDER — INSULIN U-100 REGULAR HUMAN 100 UNIT/ML INJECTION SOLUTION
1 refills | 0 days | Status: CP
Start: 2019-01-06 — End: 2020-01-06

## 2019-01-06 NOTE — Unmapped (Signed)
Logan Quinn is requesting another round of Keflex. It has been a month. Do you want him seen?

## 2019-01-06 NOTE — Unmapped (Signed)
Pt changing pharmacies need these resent to new pharmacy which is [facility].

## 2019-01-06 NOTE — Unmapped (Signed)
FYI: If you are refilling keflex, he is switching to [facility].

## 2019-01-07 MED ORDER — CETIRIZINE 10 MG TABLET
ORAL_TABLET | Freq: Every evening | ORAL | 1 refills | 90 days | Status: CP | PRN
Start: 2019-01-07 — End: 2020-01-07

## 2019-01-07 MED ORDER — CEPHALEXIN 500 MG CAPSULE
ORAL_CAPSULE | Freq: Two times a day (BID) | ORAL | 0 refills | 30.00000 days | Status: CP
Start: 2019-01-07 — End: 2019-02-06

## 2019-01-07 NOTE — Unmapped (Signed)
Addended by: Barbaraann Boys on: 01/07/2019 10:32 AM     Modules accepted: Orders

## 2019-01-19 NOTE — Unmapped (Deleted)
Assessment and Plan:     There are no diagnoses linked to this encounter.     HGB A1c *** (*** from 7.5 three months ago).   DM well controlled. Continue glipizide 20 mg BID, Lantus 80 units HS, and Humulin as directed with sliding scale .  Encouraged patient to continue carb controlled diet and regular exercise.     Well controlled. Continue cimetidine 200 mg BID.      HPI:      Logan Quinn  is here for No chief complaint on file.    Diabetes: Patient presents for follow up of diabetes.  A1C goal is <8.  Diabetes has customarily {been/not been:38678} at goal (complicated by: ***).  Current symptoms include: {dm sx:14075}. Symptoms have {symptom progression:19445}. Patient denies {dm sx:19199}. Evaluation to date has included: {dm [phone]}.  Home sugars: {dm home sugars:14018}. Current treatment: Continued insulin which has been {effective/ineffective:14021} and Continued sulfonylurea which has been {effective/ineffective:14021}.  Doing regular exercise: {yes no:22180}.     GERD: Patient presents for follow-up  dyspepsia. Symptoms have been present for approximately several years. Current treatment includes: cimetidine. Results of treatment: {GERD tx results:13198}. Currently, the symptoms are {severity:5014}.      {kmacutedx:61998}     [facility] Components:     Goals     ??? Self- Management Goal        Things to think about to help me reach my goal:     What are you going to do? Increase activity/motivation   How and how much? Clean up and do something constructive   How frequent? 2 x week    Barriers to success? fatigue   Solutions to barriers? Going to sleep earlier          ??? Self- Management Goal        Things to think about to help me reach my goal:     What are you going to do? Continue physical activity    How and how much? Fixing house    How frequent? daily   Barriers to success?    Solutions to barriers?                Medication adherence and barriers to the treatment plan have been addressed. Opportunities to optimize healthy behaviors have been discussed. Patient / caregiver voiced understanding.      Past Medical/Surgical History:     Past Medical History:   Diagnosis Date   ??? Acne    ??? Allergic    ??? Anxiety    ??? Depression    ??? Diabetes mellitus (CMS-HCC)    ??? GERD (gastroesophageal reflux disease)    ??? Gout    ??? Hypertension    ??? IBS (irritable bowel syndrome)    ??? 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 Liver disease    ??? Migraines    ??? Morbid obesity with BMI of 60.0-69.9, adult (CMS-HCC)    ??? 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 Obstructive sleep apnea    ??? OSA on CPAP    ??? Severe obstructive sleep apnea    ??? Trapezius muscle strain 12/07/2013   ??? Urinary incontinence, nocturnal enuresis    ??? Venous insufficiency      Past Surgical History:   Procedure Laterality Date   ??? EYE SURGERY  11/15   ??? PR COLONOSCOPY FLX DX W/COLLJ SPEC WHEN PFRMD N/A 03/24/2013    Procedure: COLONOSCOPY, FLEXIBLE, PROXIMAL TO SPLENIC FLEXURE; DIAGNOSTIC, W/WO  COLLECTION SPECIMEN BY BRUSH OR WASH;  Surgeon: Clint Bolder, MD;  [facility];  Service: Gastroenterology   ??? PR EYE SURG POST SGMT PROC UNLISTED Left     pneumatic retinopexy OS   ??? PR UPPER GI ENDOSCOPY,DIAGNOSIS N/A 02/02/2013    Procedure: UGI ENDO, INCLUDE ESOPHAGUS, STOMACH, & DUODENUM &/OR JEJUNUM; DX W/WO COLLECTION SPECIMN, BY BRUSH OR WASH;  Surgeon: Malcolm Metro, MD;  Location: [facility];  Service: Gastroenterology   ??? [place] PYLORIC STENOSIS ([place] HISTORICAL RESULT)         Family History:     Family History   Problem Relation Age of Onset   ??? Cancer Maternal Grandfather    ??? Hearing loss Maternal Grandfather    ??? Cancer Paternal Grandfather    ??? COPD Paternal Grandmother    ??? Arthritis Paternal Grandmother    ??? Depression Paternal Grandmother    ??? Diabetes Mother    ??? Heart disease Mother    ??? Migraines Mother    ??? Arthritis Mother    ??? Depression Mother    ??? GER disease Mother    ??? Hypertension Mother ??? Angina Mother    ??? COPD Mother    ??? Glaucoma Mother    ??? Diabetes Sister    ??? Migraines Sister    ??? Asthma Sister    ??? Depression Sister    ??? Diabetes Brother    ??? Asthma Brother    ??? Diabetes Maternal Grandmother    ??? Heart disease Maternal Grandmother    ??? Migraines Maternal Grandmother    ??? Depression Maternal Grandmother    ??? Angina Maternal Grandmother    ??? Hypertension Maternal Grandmother    ??? Diabetes Maternal Uncle    ??? Diabetes Maternal Uncle    ??? Liver disease Maternal Uncle    ??? Kidney disease Maternal Uncle    ??? Asthma Brother    ??? Colorectal Cancer Neg Hx    ??? Esophageal cancer Neg Hx    ??? Liver cancer Neg Hx    ??? Pancreatic cancer Neg Hx    ??? Stomach cancer Neg Hx    ??? Glaucoma Neg Hx    ??? Amblyopia Neg Hx    ??? Blindness Neg Hx    ??? Retinal detachment Neg Hx    ??? Strabismus Neg Hx    ??? Macular degeneration Neg Hx    ??? Anesthesia problems Neg Hx    ??? Broken bones Neg Hx    ??? Clotting disorder Neg Hx    ??? Collagen disease Neg Hx    ??? Dislocations Neg Hx    ??? Fibromyalgia Neg Hx    ??? Gout Neg Hx    ??? Hemophilia Neg Hx    ??? Osteoporosis Neg Hx    ??? Rheumatologic disease Neg Hx    ??? Scoliosis Neg Hx    ??? Severe sprains Neg Hx    ??? Sickle cell anemia Neg Hx    ??? Spinal Compression Fracture Neg Hx    ??? Melanoma Neg Hx    ??? Basal cell carcinoma Neg Hx    ??? Squamous cell carcinoma Neg Hx    ??? Deep vein thrombosis Neg Hx        Social History:     Social History     Socioeconomic History   ??? Marital status: Married     Spouse name: Not on file   ??? Number of children:  Not on file   ??? Years of education: Not on file   ??? Highest education level: Not on file   Occupational History   ??? Not on file   Social Needs   ??? Financial resource strain: Not on file   ??? Food insecurity     Worry: Not on file     Inability: Not on file   ??? Transportation needs     Medical: Not on file     Non-medical: Not on file   Tobacco Use   ??? Smoking status: Never Smoker   ??? Smokeless tobacco: Never Used   Substance and Sexual Activity ??? Alcohol use: Never     Alcohol/week: 0.0 standard drinks     Comment: rare social   ??? Drug use: Never   ??? Sexual activity: Yes     Partners: Female     Birth control/protection: None   Lifestyle   ??? Physical activity     Days per week: Not on file     Minutes per session: Not on file   ??? Stress: Not on file   Relationships   ??? Social Wellsite geologist on phone: Not on file     Gets together: Not on file     Attends religious service: Not on file     Active member of club or organization: Not on file     Attends meetings of clubs or organizations: Not on file     Relationship status: Not on file   Other Topics Concern   ??? Do you use sunscreen? Yes   ??? Tanning bed use? No   ??? Are you easily burned? No   ??? Excessive sun exposure? No   ??? Blistering sunburns? No   Social History Narrative   ??? Not on file       Allergies:     Erythromycin, Penicillins, Sulfa (sulfonamide antibiotics), Other, Erythromycin base, and Sulfasalazine    Current Medications:     Current Outpatient Medications   Medication Sig Dispense Refill   ??? ACCU-CHEK SOFTCLIX LANCETS lancets TEST three times a day 100 each 3   ??? acetaminophen (TYLENOL) 500 MG tablet Take 2 tablets (1,000 mg total) by mouth Three (3) times a day. 30 tablet 0   ??? ADVAIR DISKUS 500-50 mcg/dose diskus Inhale 1 puff 2 (two) times a day. 13 Inhaler 1   ??? albuterol 2.5 mg /3 mL (0.083 %) nebulizer solution Inhale 3 mL (2.5 mg total) by nebulization every four (4) hours as needed for wheezing. 25 vial 2   ??? albuterol HFA 90 mcg/actuation inhaler Inhale 2 puffs every six (6) hours as needed for wheezing. 3 Inhaler 1   ??? ALPRAZolam (XANAX) 0.5 MG tablet Take 1 tablet (0.5 mg total) by mouth Three (3) times a day. (Patient taking differently: Take 1 mg by mouth Three (3) times a day. ) 60 tablet 0   ??? blood sugar diagnostic (ACCU-CHEK AVIVA PLUS TEST STRP) Strp by Other route Three (3) times a day before meals. 300 each 1   ??? blood sugar diagnostic Strp Use 1 strip 3 times a day with meter 100 each 11   ??? blood-glucose meter (ACCU-CHEK AVIVA PLUS METER) Misc Accu Chek Aviva Meter Use to test Blood sugar as directed E11.65 1 each 0   ??? cephalexin (KEFLEX) 500 MG capsule Take 1 capsule (500 mg total) by mouth Two (2) times a day. For 2 to 4 weeks as needed during flare.  60 capsule 0   ??? cetirizine (ZYRTEC) 10 MG tablet Take 1 tablet (10 mg total) by mouth nightly as needed for allergies. 90 tablet 1   ??? cimetidine (TAGAMET) 200 MG tablet Take 1 tablet (200 mg total) by mouth Two (2) times a day. 180 tablet 1   ??? clotrimazole-betamethasone (LOTRISONE) 1-0.05 % cream Apply topically Two (2) times a day. 60 g 1   ??? diclofenac (VOLTAREN) 50 MG EC tablet Take 1 tablet (50 mg total) by mouth two (2) times a day as needed. 60 each 2   ??? dicyclomine (BENTYL) 10 mg capsule Take 1 capsule (10 mg total) by mouth Three (3) times a day. 270 capsule 1   ??? diphenoxylate-atropine (LOMOTIL) 2.5-0.025 mg per tablet Take 1 tablet by mouth two (2) times a day as needed for diarrhea. 30 tablet 5   ??? empty container Misc Use as directed to dispose of Humira injections 1 each 2   ??? erenumab-aooe (AIMOVIG AUTOINJECTOR) 70 mg/mL AtIn Inject 1 mL under the skin every twenty-eight (28) days. 3 Syringe 1   ??? ergocalciferol (DRISDOL) 1,250 mcg (50,000 unit) capsule Take 1 capsule (50,000 Units total) by mouth once a week. 4 capsule 2   ??? fluticasone propionate (FLONASE) 50 mcg/actuation nasal spray 1 spray each nare daily 3 Bottle 3   ??? glipiZIDE (GLUCOTROL) 10 MG tablet Take 2 tablets (20 mg total) by mouth Two (2) times a day (30 minutes before a meal). 360 tablet 1   ??? HUMIRA PEN CITRATE FREE 40 MG/0.4 ML Inject the contents of 2 pens (80mg  total) under the skin every seven (7) days. 8 each 3   ??? hydrocortisone (ANUSOL-HC) 25 mg suppository Insert 1 suppository (25 mg total) into the rectum Two (2) times a day. 24 each 1   ??? hydrocortisone (PROCTOSOL HC) 2.5 % rectal cream Insert 1 application into the rectum two (2) times a day as needed. 28.35 g 2   ??? ibuprofen (ADVIL,MOTRIN) 800 MG tablet Take 1 tablet (800 mg total) by mouth every eight (8) hours as needed. 90 tablet 1   ??? insulin glargine (LANTUS U-100 INSULIN) 100 unit/mL injection Inject 0.8 mL (80 Units total) under the skin nightly. 72 mL 1   ??? insulin regular (HUMULIN R REGULAR U-100 INSULN) 100 unit/mL injection SLIDING SCALE BLOOD SUGAR 0-150=0 UNITS,150-200=3 UNITS, 200-250=6 UNITS,250-300=9 UNITS, 300-350=12 UNITS 40 mL 1   ??? insulin syringe-needle U-100 (BD INSULIN SYRINGE ULTRA-FINE) 1 mL 31 gauge x 5/16 (8 mm) Syrg USE AS DIRECTED. 100 each 1   ??? lancets (ACCU-CHEK SOFTCLIX LANCETS) Misc Inject 1 each under the skin Three (3) times a day. 300 each 11   ??? levothyroxine (SYNTHROID) 50 MCG tablet Take 1 tablet (50 mcg total) by mouth daily. 90 tablet 1   ??? MITIGARE 0.6 mg cap capsule Take 1.2 mg on Day 1 of flare, followed by 0.6 mg one hour later. Day 2 take 0.6 mg BID until flare resolves. 60 capsule 3   ??? montelukast (SINGULAIR) 10 mg tablet Take 1 tablet (10 mg total) by mouth daily. 90 each 1   ??? oxybutynin (DITROPAN) 5 MG tablet Take 1 tablet (5 mg total) by mouth Two (2) times a day. 90 tablet 3   ??? PROAIR HFA 90 mcg/actuation inhaler Inhale 2 puffs every four (4) hours as needed for wheezing. 3 Inhaler 1   ??? promethazine (PHENERGAN) 25 MG tablet Take 1-2 tablets (25-50 mg total) by mouth nightly as needed for  nausea. 30 each 2   ??? propranoloL (INDERAL LA) 120 mg 24 hr capsule Take 1 capsule (120 mg total) by mouth daily. 90 capsule 1   ??? topiramate (TOPAMAX) 25 MG tablet Take 1 tablet (25 mg total) by mouth Two (2) times a day. 180 tablet 1   ??? traZODone (DESYREL) 100 MG tablet take 2 tablets by mouth at bedtime  0     No current facility-administered medications for this visit.        Health Maintenance:     Health Maintenance Summary w/Most Recent Date       Status Date      Retinal Eye Exam Overdue 09/18/2018      Done 09/17/2017 SmartData: OPHTH FUNDUS OD PERIPHERY     Done 09/17/2017 SmartData: OPHTH FUNDUS OS PERIPHERY     Done 09/17/2017 SmartData: FINDINGS - PE - EYES - FUNDUSCOPIC - PERIPHERY - RIGHT PERIPHERY NORMAL     Done 09/30/2016 SmartData: OPHTH FUNDUS OD PERIPHERY     Done 09/30/2016 SmartData: OPHTH FUNDUS OS PERIPHERY     Patient has more history with this topic...    Hemoglobin A1c Next Due 01/21/2019      Done 10/21/2018 Registry Metric: Last Hemoglobin A1c Date     Done 10/21/2018 POCT GLYCOSYLATED HEMOGLOBIN (HGB A1C) HGB A1C, RAP/PDS           Done 07/08/2018 POCT GLYCOSYLATED HEMOGLOBIN (HGB A1C) HGB A1C, RAP/PDS           Done 01/05/2018 POCT GLYCOSYLATED HEMOGLOBIN (HGB A1C) HGB A1C, RAP/PDS           Done 10/06/2017 POCT GLYCOSYLATED HEMOGLOBIN (HGB A1C) HGB A1C, RAP/PDS           Patient has more history with this topic...    Influenza Vaccine Next Due 02/09/2019      Done 02/23/2018 Imm Admin: Influenza Vaccine Quad (IIV4 PF) 46mo+ injectable     Done 02/23/2018 Imm Admin: Influenza Virus Vaccine, unspecified formulation     Done 03/19/2017 Imm Admin: Influenza Vaccine Quad (IIV4 PF) 41mo+ injectable     Done 03/19/2017 Imm Admin: Influenza Virus Vaccine, unspecified formulation     Done 03/05/2016 Imm Admin: Influenza Vaccine Quad (IIV4 PF) 80mo+ injectable     Patient has more history with this topic...    Serum Creatinine Monitoring Next Due 04/08/2019      Done 04/07/2018 COMPREHENSIVE METABOLIC PANEL Creatinine           Done 04/20/2017 BASIC METABOLIC PANEL Creatinine           Done 11/10/2016 COMPREHENSIVE METABOLIC PANEL Creatinine           Done 06/23/2016 BASIC METABOLIC PANEL Creatinine           Done 06/22/2016 BASIC METABOLIC PANEL Creatinine           Patient has more history with this topic...    Potassium Monitoring Next Due 04/08/2019      Done 04/07/2018 COMPREHENSIVE METABOLIC PANEL Potassium           Done 04/20/2017 BASIC METABOLIC PANEL Potassium           Done 11/10/2016 COMPREHENSIVE METABOLIC PANEL Potassium           Done 06/23/2016 BASIC METABOLIC PANEL Potassium           Done 06/22/2016 BASIC METABOLIC PANEL Potassium           Patient has more history with this topic...    Urine  Albumin/Creatinine Ratio Next Due 07/09/2019      Done 07/08/2018 Registry Metric: [facility] DM AMB LAST URINE MICROALBUMIN TO CREATININE RATIO     Done 07/08/2018 ALBUMIN / CREATININE URINE RATIO Albumin/Creatinine Ratio           Done 06/30/2017 ALBUMIN / CREATININE URINE RATIO Albumin/Creatinine Ratio           Done 05/24/2016 ALBUMIN / CREATININE URINE RATIO Albumin/Creatinine Ratio           Done 03/24/2015 ALBUMIN / CREATININE URINE RATIO Albumin/Creatinine Ratio           Patient has more history with this topic...    Foot Exam Next Due 10/21/2019      Done 10/21/2018 [facility] DIABETES FOOT EXAM     Done 10/06/2017 HM DIABETES FOOT EXAM     Done 09/13/2016 HM DIABETES FOOT EXAM     Done 08/07/2015 HM DIABETES FOOT EXAM    DTaP/Tdap/Td Vaccines Next Due 07/17/2023      Done 07/16/2013 Imm Admin: TdaP     Done 07/18/2008 Imm Admin: TdaP     Done 04/29/2000 Imm Admin: Tetanus and diptheria,(adult), adsorbed, 2Lf tetanus toxoid, PF    Pneumococcal Vaccine This plan is no longer active.      Done 12/30/2012 Imm Admin: PNEUMOCOCCAL POLYSACCHARIDE 23          Immunizations:     Immunization History   Administered Date(s) Administered   ??? Hepatitis B Vaccine, Unspecified Formulation 12/27/1999, 04/29/2000   ??? Hepatitis B, Adult 03/05/2013, 04/05/2013, 09/03/2013   ??? INFLUENZA TIV (TRI) PF (IM) 10/08/2011   ??? Influenza Vaccine Quad (IIV4 PF) 59mo+ injectable 03/05/2013, 03/18/2014, 03/22/2015, 03/05/2016, 03/19/2017, 02/23/2018   ??? Influenza Virus Vaccine, unspecified formulation 03/19/2017, 02/23/2018   ??? PNEUMOCOCCAL POLYSACCHARIDE 23 12/30/2012   ??? PPD Test 10/28/2016   ??? TdaP 07/18/2008, 07/16/2013   ??? Tetanus and diptheria,(adult), adsorbed, 2Lf tetanus toxoid, PF 04/29/2000       I have reviewed and (if needed) updated the patient's problem list, medications, allergies, past medical and surgical history, social and family history.    ROS:      ROS  Comprehensive 10 point ROS negative unless otherwise stated in the HPI.       Vital Signs:     Wt Readings from Last 3 Encounters:   10/21/18 (!) 226.5 kg (499 lb 6.4 oz)   07/22/18 (!) 219.1 kg (483 lb)   07/08/18 (!) 215.8 kg (475 lb 12.8 oz)     Temp Readings from Last 3 Encounters:   10/21/18 36.8 ??C (98.3 ??F) (Oral)   04/22/18 37.1 ??C (98.7 ??F) (Temporal)   06/30/17 36.4 ??C (97.6 ??F) (Oral)     BP Readings from Last 3 Encounters:   10/21/18 118/80   07/22/18 110/80   07/08/18 120/88     Pulse Readings from Last 3 Encounters:   10/21/18 65   07/22/18 66   07/08/18 73     Estimated body mass index is 71.66 kg/m?? as calculated from the following:    Height as of 04/22/18: 177.8 cm (5' 10).    Weight as of 10/21/18: 226.5 kg (499 lb 6.4 oz).  No height and weight on file for this encounter.        Objective:      General: Alert and oriented x3. Well-appearing. No acute distress. ***  HEENT:  Normocephalic.  Atraumatic. Conjunctiva and sclera normal. OP MMM without lesions. ***  Neck:  Supple. No thyroid enlargement.  No adenopathy. ***  Heart:  Regular rate and rhythm . Normal S1, S2.  No murmurs, rubs or gallops. ***  Lungs:  No respiratory distress.  Lungs clear to auscultation. No wheezes, rhonchi, or rales. ***  GI/GU:  Soft, +BS, nondistended, non-TTP. No palpable masses or organomegaly. ***  MSK: Gait and station unremarkable. Normal ROM major joints. Normal strength and tone of proximal muscles.***   Extremities:  No edema. Peripheral pulses normal. ***  Skin:  Warm, dry. No rash or lesions present. ***  Neuro:  Non-focal. No obvious weakness. ***  Psych:  Affect normal, eye contact good, speech clear and coherent. ***     I attest that I, Bayard Hugger, personally documented this note while acting as scribe for [name], FNP.      Bayard Hugger, Scribe.  01/21/2019     The documentation recorded by the scribe accurately reflects the service I personally performed and the decisions made by me.    [name], FNP

## 2019-01-28 NOTE — Unmapped (Signed)
[facility] Refill Coordination Note    Specialty Medication(s) to be Shipped:   Inflammatory Disorders: Humira     Other medication(s) to be shipped:       Logan Quinn, DOB: April 09, 1981  Phone: [phone] (home)       All above HIPAA information was verified with patient.     Completed refill call assessment today to schedule patient's medication shipment from the [facility] [phone]).       Specialty medication(s) and dose(s) confirmed: Regimen is correct and unchanged.   Changes to medications: Santiel reports no changes at this time.  Changes to insurance: No  Questions for the pharmacist: No    Confirmed patient received Welcome Packet with first shipment. The patient will receive a drug information handout for each medication shipped and additional FDA Medication Guides as required.       DISEASE/MEDICATION-SPECIFIC INFORMATION        N/A/0.4    SPECIALTY MEDICATION ADHERENCE     Medication Adherence    Patient reported X missed doses in the last month: 0  Specialty Medication: humira cf 40 mg/0.4 ml   Patient is on additional specialty medications: No  Patient is on more than two specialty medications: No                Humira 40/0.4 mg/ml: 7 days of medicine on hand       SHIPPING     Shipping address confirmed in Epic.     Delivery Scheduled: Yes, Expected medication delivery date: 08/25.     Medication will be delivered via Same Day Courier to the home address in Epic WAM.    Antonietta Barcelona   [facility] Specialty Technician

## 2019-01-28 NOTE — Unmapped (Signed)
.  hem

## 2019-02-02 MED FILL — HUMIRA PEN CITRATE FREE 40 MG/0.4 ML: 28 days supply | Qty: 8 | Fill #2

## 2019-02-02 MED FILL — HUMIRA PEN CITRATE FREE 40 MG/0.4 ML: 28 days supply | Qty: 8 | Fill #2 | Status: AC

## 2019-02-02 NOTE — Unmapped (Signed)
Video on 09/08 at 10:00 am with Dr Elder Cyphers

## 2019-02-03 NOTE — Unmapped (Signed)
error 
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
## 2019-02-08 ENCOUNTER — Ambulatory Visit: Admit: 2019-02-08 | Discharge: 2019-02-09 | Payer: MEDICAID | Attending: Family | Primary: Family

## 2019-02-08 DIAGNOSIS — F3342 Major depressive disorder, recurrent, in full remission: Secondary | ICD-10-CM

## 2019-02-08 DIAGNOSIS — Z6841 Body Mass Index (BMI) 40.0 and over, adult: Secondary | ICD-10-CM

## 2019-02-08 DIAGNOSIS — E1165 Type 2 diabetes mellitus with hyperglycemia: Principal | ICD-10-CM

## 2019-02-08 DIAGNOSIS — Z794 Long term (current) use of insulin: Secondary | ICD-10-CM

## 2019-02-08 DIAGNOSIS — I152 Hypertension secondary to endocrine disorders: Secondary | ICD-10-CM

## 2019-02-08 DIAGNOSIS — G4733 Obstructive sleep apnea (adult) (pediatric): Secondary | ICD-10-CM

## 2019-02-09 DIAGNOSIS — R7989 Other specified abnormal findings of blood chemistry: Secondary | ICD-10-CM

## 2019-02-09 DIAGNOSIS — E559 Vitamin D deficiency, unspecified: Secondary | ICD-10-CM

## 2019-02-09 DIAGNOSIS — E039 Hypothyroidism, unspecified: Secondary | ICD-10-CM

## 2019-02-09 MED ORDER — BLOOD-GLUCOSE METER KIT WRAPPER
0 refills | 0 days | Status: CP
Start: 2019-02-09 — End: 2019-02-10

## 2019-02-10 MED ORDER — BLOOD SUGAR DIAGNOSTIC STRIPS
Freq: Three times a day (TID) | 1 refills | 0 days | Status: CP
Start: 2019-02-10 — End: 2020-02-10

## 2019-02-10 MED ORDER — LANCETS
Freq: Three times a day (TID) | 1 refills | 0.00000 days | Status: CP
Start: 2019-02-10 — End: 2020-02-10

## 2019-02-10 MED ORDER — BLOOD-GLUCOSE METER KIT WRAPPER
0 refills | 0 days | Status: CP
Start: 2019-02-10 — End: 2020-02-10

## 2019-02-13 NOTE — Unmapped (Signed)
Copied from CRM [id number]. Topic: HL - Nurse Triage - [facility] Contract Page  >> Feb 13, 2019  1:39 AM Venetia Night, RN wrote:  Please use the following dot phrase: .[email or web address]  [phone] [facility] re: Logan Quinn [phone]     Source should be ON CALL PROVIDER   Subject should indicate PATIENT   Attach the Triage Encounter by clicking Attachments and under Patient Actions Clinical Call -click copy and copy all notes and Continue   Enter the name & time of page into the Provider Paging form each time the on call provider is paged  Route/Resolve ONLY when there is a return page or the call is completed using appropriate reason and close workspace    [phone] [facility] re: Logan Quinn [phone]    [phone]  Spoke with Dr. Vinson Moselle.  She advised go to ED.    0140  Spoke with [facility] and informed of MD instructions.  Voiced understanding/

## 2019-02-13 NOTE — Unmapped (Signed)
Recent:   What is the date of your last related visit?  N/A  Related acute medications Rx'd:  N/A  Home treatment tried:  Humulin 20 units at 2200; Lantus 70 units at 0045    Relevant:   Allergies: N/A  Medications: see Epic  Health History: see Epic  Weight: N/A     States no recent changes in medications or medical history.     Reason for Disposition  ??? Blood glucose > 400 mg/dL (16.1 mmol/L)    Answer Assessment - Initial Assessment Questions  1. BLOOD GLUCOSE: What is your blood glucose level?     460, 474, 570(current).  Now at 456.  2. ONSET: When did you check the blood glucose?      0053  3. USUAL RANGE: What is your glucose level usually? (e.g., usual fasting morning value, usual evening value)      100-120  4. KETONES: Do you check for ketones (urine or blood test strips)? If yes, ask: What does the test show now?      no  5. TYPE 1 or 2:  Do you know what type of diabetes you have?  (e.g., Type 1, Type 2, Gestational; doesn't know)       Type 2  6. INSULIN: Do you take insulin? What type of insulin(s) do you use? What is the mode of delivery? (syringe, pen; injection or pump)?       Yes.  Lantus and Humalin  7. DIABETES PILLS: Do you take any pills for your diabetes? If yes, ask: Have you missed taking any pills recently?      glucotrol  8. OTHER SYMPTOMS: Do you have any symptoms? (e.g., fever, frequent urination, difficulty breathing, dizziness, weakness, vomiting)      Frequent urination and headach  9. PREGNANCY: Is there any chance you are pregnant? When was your last menstrual period?      male    Protocols used: DIABETES - HIGH BLOOD SUGAR-A-AH

## 2019-02-16 ENCOUNTER — Telehealth: Admit: 2019-02-16 | Discharge: 2019-02-17 | Payer: MEDICAID | Attending: Dermatology | Primary: Dermatology

## 2019-02-16 DIAGNOSIS — L732 Hidradenitis suppurativa: Secondary | ICD-10-CM

## 2019-02-16 DIAGNOSIS — B356 Tinea cruris: Secondary | ICD-10-CM

## 2019-02-16 MED ORDER — KETOCONAZOLE 2 % TOPICAL CREAM
Freq: Every day | TOPICAL | 11 refills | 30.00000 days | Status: CP
Start: 2019-02-16 — End: 2020-02-16

## 2019-02-16 MED ORDER — GLIPIZIDE 10 MG TABLET
ORAL_TABLET | Freq: Two times a day (BID) | ORAL | 1 refills | 90 days | Status: CP
Start: 2019-02-16 — End: 2020-02-17

## 2019-02-16 MED ORDER — CEPHALEXIN 500 MG CAPSULE
ORAL_CAPSULE | Freq: Two times a day (BID) | ORAL | 0 refills | 30.00000 days | Status: CP
Start: 2019-02-16 — End: 2019-03-18

## 2019-02-16 NOTE — Unmapped (Signed)
Teledermatology Note- Return Patient   02/16/2019      Encounter Description/Consent: This encounter was conducted from [facility]  via live, face-to-face video conference with the patient. Logan Quinn was located in [place].  The patient verified his identity with his date of birth and verbally consented to evaluation and management of his condition through telemedicine.     Total time spent reviewing chart, uploaded images, and discussion with patient: 15 minutes    I spent 10 minutes on the real-time audio and video with the patient. I spent an additional 5 minutes on pre- and post-visit activities.     The patient was physically located in [place] or a state in which I am permitted to provide care. The patient and/or parent/guardian understood that s/he may incur co-pays and cost sharing, and agreed to the telemedicine visit. The visit was reasonable and appropriate under the circumstances given the patient's presentation at the time.    The patient and/or parent/guardian has been advised of the potential risks and limitations of this mode of treatment (including, but not limited to, the absence of in-person examination) and has agreed to be treated using telemedicine. The patient's/patient's family's questions regarding telemedicine have been answered.     If the visit was completed in an ambulatory setting, the patient and/or parent/guardian has also been advised to contact their provider???s office for worsening conditions, and seek emergency medical treatment and/or call 911 if the patient deems either necessary.      NOTE: Telemedicine enables health care providers at different locations to provide safe, effective, and convenient care through the use of technology. As with any health care service, there are risks associated with the use of telemedicine and teledermatology, including lack of visualization,and there may be instances where the patient needs to be seen is person  This visit was performed via telemedicine during the COVID-19 Health Crisis during a State of National Emergency. The impression and recommendations were made on the basis of the history obtained via phone or video connection  and the physical exam reviewed electronically, which is limited by/to the uploaded images and their intrinsic quality.  All questions were answered, and the patient agreed to proceed.    Impression/ Plan:    Hidradenitis suppurativa - hurley stage II   - unroofing of right inguinal lesion 11462   - cephalexin (KEFLEX) 500 MG capsule; Take 1 capsule (500 mg total) by mouth Two (2) times a day. R/B/A reviewed including but not limited to rash.   - continue humira 80mg  weekly.  R/B/A reviewed including but not limited to increased risk infection.     Tinea cruris- empiric treatment per below, will examine in person during follow up visit   - ketoconazole (NIZORAL) 2 % cream; Apply 1 application topically daily.      - The patient was also advised to send a MyChart message or call for an appointment should any new, changing, or symptomatic lesions/rash develop in the interim.     RV: 1 m      ________________________________________________________________________________________________________    Ref Provider: Loran Senters    Patient interviewed in a private place    CC/HPI:    Logan Quinn is a friendly 38 y.o. male , a Return  patient for a NEW and ESTABLISHED problem, evaluated today in a teledermatology encounter for:       Chief Complaint   Patient presents with   ??? Follow-up     HS, pt  states he is not doing to good, has an active flare in groin, started on yesterday 02/11/19    ??? Consent     to VV, will be in [place]      Last derm visit: 11/2018   At that visit:  Humira uptitrated to 80mg  weekly. Rx keflex 500mg  BID which was helpful for flare.     Today he notes:   - overall improved on humira 80mg  weekly  - now one area on upper right groin that is draining frequently. Other areas are improved   - has keflex 500mg  BID that he is currently taking; has helped a little   - has had a rash on his penis and around head of penis. Little red dots Not using any treatments at this time. Has tried nystatin powder. No present.     Pertinent PMH/FH/SH/Allergies, Medications:      Reviewed in Epic      ROS:     No fevers, chills, or other skin complaints.    PE:    General: The patient sits comfortably in view of the camera.  The patient's breathing is observed to be comfortable and normal.  The patient is in no acute distress.  All extremity movements appear intact.  There are no focal neurological deficits observed.   Neuro: Alert, answering questions appropriately.    Skin: Images of the right superior groin were uploaded by patient and evaluated via the electronic medical record. Findings were normal with exception of the following:  - right groin with inflamed non draining sinus tract x 1 and inflamed non draining nodule x 1   - All other areas examined via photos/video were normal or had no significant findings      Venia Carbon, MD  02/16/2019

## 2019-02-16 NOTE — Unmapped (Signed)
03-24-2019 @ 330

## 2019-02-16 NOTE — Unmapped (Signed)
-----   Message from Venia Carbon, MD sent at 02/16/2019 10:47 AM EDT -----  Next available -10/14PM slot would work.

## 2019-02-18 ENCOUNTER — Other Ambulatory Visit: Payer: Self-pay

## 2019-02-18 ENCOUNTER — Encounter (INDEPENDENT_AMBULATORY_CARE_PROVIDER_SITE_OTHER): Payer: Self-pay | Admitting: Nurse Practitioner

## 2019-02-18 ENCOUNTER — Ambulatory Visit (INDEPENDENT_AMBULATORY_CARE_PROVIDER_SITE_OTHER): Payer: Medicaid Other | Admitting: Nurse Practitioner

## 2019-02-18 VITALS — BP 164/82 | HR 85 | Resp 14 | Ht 71.0 in | Wt >= 6400 oz

## 2019-02-18 DIAGNOSIS — I890 Lymphedema, not elsewhere classified: Secondary | ICD-10-CM | POA: Diagnosis not present

## 2019-02-18 DIAGNOSIS — K219 Gastro-esophageal reflux disease without esophagitis: Secondary | ICD-10-CM | POA: Diagnosis not present

## 2019-02-18 DIAGNOSIS — I83813 Varicose veins of bilateral lower extremities with pain: Secondary | ICD-10-CM | POA: Diagnosis not present

## 2019-02-18 NOTE — Progress Notes (Signed)
SUBJECTIVE:  Patient ID: Jason Davidson, male    DOB: [birth year], [age] y.o.   MRN: [id number] Chief Complaint  Patient presents with   Follow-up    HPI  Jason Davidson is a 38 y.o. male today with complaints of redness and some pain in his bilateral lower extremities with the overall right being worse than the left.  Patient was initially concerned about infection, however the redness is due more so to stasis dermatitis.  The patient endorses wearing medical grade 1 compression stockings on a daily basis however despite their use he notices that he has extreme swelling by the end of the day and continues to have pain over areas where there are prominent varicosities.  He has utilized NSAIDs however they have not been effective with treating his pain.  This pain is also stopping him from pursuing his everyday activities as well as exercising.  He denies any fever, chills, nausea, vomiting or diarrhea.  He denies any chest pain or shortness of breath.  Past Medical History:  Diagnosis Date   Asthma    Depressed    Diabetes mellitus without complication (HCC)    IBS (irritable bowel syndrome)    Morbid obesity (HCC)     Past Surgical History:  Procedure Laterality Date   EYE SURGERY      Social History   Socioeconomic History   Marital status: Married    Spouse name: Not on file   Number of children: Not on file   Years of education: Not on file   Highest education level: Not on file  Occupational History   Not on file  Social Needs   Financial resource strain: Not on file   Food insecurity    Worry: Not on file    Inability: Not on file   Transportation needs    Medical: Not on file    Non-medical: Not on file  Tobacco Use   Smoking status: Never Smoker   Smokeless tobacco: Never Used  Substance and Sexual Activity   Alcohol use: No   Drug use: No   Sexual activity: Not on file  Lifestyle   Physical activity    Days per week: Not on  file    Minutes per session: Not on file   Stress: Not on file  Relationships   Social connections    Talks on phone: Not on file    Gets together: Not on file    Attends religious service: Not on file    Active member of club or organization: Not on file    Attends meetings of clubs or organizations: Not on file    Relationship status: Not on file   Intimate partner violence    Fear of current or ex partner: Not on file    Emotionally abused: Not on file    Physically abused: Not on file    Forced sexual activity: Not on file  Other Topics Concern   Not on file  Social History Narrative   Not on file    Family History  Problem Relation Age of Onset   Diabetes Mother    Diabetes Sister    Diabetes Brother    Diabetes Maternal Uncle    Diabetes Maternal Grandmother    Heart disease Maternal Grandmother    Cancer Maternal Grandfather    COPD Paternal Grandmother    Cancer Paternal Grandfather    Varicose Veins Paternal Grandfather     Allergies  Allergen Reactions   Erythromycin  Nausea And Vomiting   Penicillins Swelling   Sulfa Antibiotics Nausea And Vomiting     Review of Systems   Review of Systems: Negative Unless Checked Constitutional: [] Weight loss  [] Fever  [] Chills Cardiac: [] Chest pain   []  Atrial Fibrillation  [] Palpitations   [] Shortness of breath when laying flat   [] Shortness of breath with exertion. [] Shortness of breath at rest Vascular:  [] Pain in legs with walking   [] Pain in legs with standing [] Pain in legs when laying flat   [] Claudication    [] Pain in feet when laying flat    [] History of DVT   [] Phlebitis   [x] Swelling in legs   [x] Varicose veins   [] Non-healing ulcers Pulmonary:   [] Uses home oxygen   [] Productive cough   [] Hemoptysis   [] Wheeze  [] COPD   [] Asthma Neurologic:  [] Dizziness   [] Seizures  [] Blackouts [] History of stroke   [] History of TIA  [] Aphasia   [] Temporary Blindness   [] Weakness or numbness in arm    [] Weakness or numbness in leg Musculoskeletal:   [] Joint swelling   [] Joint pain   [] Low back pain  []  History of Knee Replacement [x] Arthritis [] back Surgeries  []  Spinal Stenosis    Hematologic:  [] Easy bruising  [] Easy bleeding   [] Hypercoagulable state   [] Anemic Gastrointestinal:  [] Diarrhea   [] Vomiting  [] Gastroesophageal reflux/heartburn   [] Difficulty swallowing. [] Abdominal pain Genitourinary:  [] Chronic kidney disease   [] Difficult urination  [] Anuric   [] Blood in urine [] Frequent urination  [] Burning with urination   [] Hematuria Skin:  [] Rashes   [] Ulcers [] Wounds Psychological:  [] History of anxiety   []  History of major depression  []  Memory Difficulties      OBJECTIVE:   Physical Exam  BP (!) 164/82 (BP Location: Left Wrist, Patient Position: Sitting, Cuff Size: Large)    Pulse 85    Resp 14    Ht 5\' 11"  (1.803 m)    Wt (!) 488 lb (221.4 kg)    BMI 68.06 kg/m   Gen: WD/WN, NAD Head: [place]/AT, No temporalis wasting.  Ear/Nose/Throat: Hearing grossly intact, nares w/o erythema or drainage Eyes: PER, EOMI, sclera nonicteric.  Neck: Supple, no masses.  No JVD.  Pulmonary:  Good air movement, no use of accessory muscles.  Cardiac: RRR Vascular: scattered varicosities present bilaterally.  Mild venous stasis changes to the legs bilaterally.  3+ soft pitting edema Hard to palpate pulses due to edema   Vessel Right Left  Radial Palpable Palpable   Gastrointestinal: soft, non-distended. No guarding/no peritoneal signs.  Musculoskeletal: M/S 5/5 throughout.  No deformity or atrophy.  Neurologic: Pain and light touch intact in extremities.  Symmetrical.  Speech is fluent. Motor exam as listed above. Psychiatric: Judgment intact, Mood & affect appropriate for pt's clinical situation. Dermatologic: No Venous rashes. No Ulcers Noted.  No changes consistent with cellulitis. Lymph : No Cervical lymphadenopathy, no lichenification or skin changes of chronic lymphedema.        ASSESSMENT AND PLAN:  1. Varicose veins of both lower extremities with pain Patient continues to have pain in his bilateral lower extremities attributed to the varicose veins.  We will obtain a bilateral lower extremity venous reflux in order to determine if his right great saphenous vein has recannulated following endovenous laser ablation.  We will also evaluate to see if there has been any worsening reflux in his left.  If it is found that the patient has an intact great saphenous vein following the procedure we will see about obtaining  more sclerotherapy to treat the prominent varicosities.  The patient is advised to continue to wear his medical grade 1 compression stockings, elevating and exercising as much as possible. - VAS US LOWER EXTREMITY VENOUS REFLUX; Future  2. Lymphedema  No surgery or intervention at this point in time.    I have reviewed my discussion with the patient regarding lymphedema and why it  causes symptoms.  Patient will continue wearing graduated compression stockings class 1 (20-30 mmHg) on a daily basis a prescription was given. The patient is reminded to put the stockings on first thing in the morning and removing them in the evening. The patient is instructed specifically not to sleep in the stockings.   In addition, behavioral modification throughout the day will be continued.  This will include frequent elevation (such as in a recliner), use of over the counter pain medications as needed and exercise such as walking.  I have reviewed systemic causes for chronic edema such as liver, kidney and cardiac etiologies and there does not appear to be any significant changes in these organ systems over the past year.  The patient is under the impression that these organ systems are all stable and unchanged.    The patient will continue aggressive use of the  lymph pump.  This will continue to improve the edema control and prevent sequela such as ulcers and infections.       3. Gastroesophageal reflux disease without esophagitis Continue PPI as already ordered, this medication has been reviewed and there are no changes at this time.  Avoidence of caffeine and alcohol  Moderate elevation of the head of the bed    Current Outpatient Medications on File Prior to Visit  Medication Sig Dispense Refill   Adalimumab (HUMIRA PEN) 40 MG/0.4ML PNKT Inject into the skin.     AIMOVIG 70 MG/ML SOAJ   0   ALPRAZolam (XANAX) 0.5 MG tablet Take 0.5 mg by mouth at bedtime as needed for anxiety.     amphetamine-dextroamphetamine (ADDERALL) 10 MG tablet Take 10 mg by mouth 2 (two) times daily with a meal.     azelastine (ASTELIN) 0.1 % nasal spray Place into the nose.     cephALEXin (KEFLEX) 500 MG capsule Take by mouth.     cetirizine (ZYRTEC) 10 MG tablet Take by mouth.     Colchicine (MITIGARE) 0.6 MG CAPS Take 1.2 mg on Day 1 of flare, followed by 0.6 mg one hour later. Day 2 take 0.6 mg BID until flare resolves.     cyclobenzaprine (FLEXERIL) 10 MG tablet Take by mouth.     cyclobenzaprine (FLEXERIL) 10 MG tablet Take 1 tablet (10 mg total) by mouth 3 (three) times daily as needed. 15 tablet 0   dicyclomine (BENTYL) 10 MG capsule Take 10 mg by mouth 3 (three) times daily before meals.     diphenoxylate-atropine (LOMOTIL) 2.5-0.025 MG tablet Take by mouth.     DULoxetine (CYMBALTA) 60 MG capsule Take 120 mg by mouth daily.      famotidine (PEPCID) 20 MG tablet Take by mouth.     Fluticasone-Salmeterol (ADVAIR) 500-50 MCG/DOSE AEPB Inhale 1 puff into the lungs 2 (two) times daily.     gabapentin (NEURONTIN) 100 MG capsule Take 100 mg by mouth 3 (three) times daily.     glipiZIDE (GLUCOTROL) 10 MG tablet Take 10 mg by mouth daily before breakfast.     glucose blood (PRECISION QID TEST) test strip Use 1 strip 3 times a day  with meter     hydrocortisone (ANUSOL-HC) 2.5 % rectal cream Place rectally.     hydrocortisone (ANUSOL-HC) 25 MG suppository  Place rectally.     insulin glargine (LANTUS) 100 UNIT/ML injection Inject into the skin.     insulin regular (NOVOLIN R,HUMULIN R) 250 units/2.51mL (100 units/mL) injection Inject into the skin 3 (three) times daily before meals.     Insulin Syringe-Needle U-100 (BD INSULIN SYRINGE U/F) 31G X 5/16" 1 ML MISC Use as directed with the Lantus Insulin nightly     levothyroxine (SYNTHROID) 50 MCG tablet Take by mouth.     loperamide (IMODIUM) 2 MG capsule Take by mouth.     montelukast (SINGULAIR) 10 MG tablet Take by mouth.     pantoprazole (PROTONIX) 40 MG tablet Take 40 mg by mouth daily.     polyethylene glycol powder (GLYCOLAX/MIRALAX) powder take 17GM (DISSOLVED IN WATER) by mouth once daily  0   PROAIR HFA 108 (90 Base) MCG/ACT inhaler   0   promethazine (PHENERGAN) 25 MG tablet Take by mouth.     propranolol (INNOPRAN XL) 120 MG 24 hr capsule Take 120 mg by mouth at bedtime.     Spacer/Aero-Holding Chambers (AEROCHAMBER PLUS) inhaler Use as instructed 1 each 2   topiramate (TOPAMAX) 50 MG tablet Take 50 mg by mouth 2 (two) times daily.     traZODone (DESYREL) 100 MG tablet Take by mouth.     Vitamin D, Ergocalciferol, (DRISDOL) 1.25 MG (50000 UT) CAPS capsule Take by mouth.     No current facility-administered medications on file prior to visit.     There are no Patient Instructions on file for this visit. No follow-ups on file.   Kris Hartmann, NP  This note was completed with Sales executive.  Any errors are purely unintentional.

## 2019-02-21 MED ORDER — FAMOTIDINE 20 MG TABLET
ORAL_TABLET | Freq: Two times a day (BID) | ORAL | 1 refills | 90 days | Status: CP
Start: 2019-02-21 — End: 2020-02-21

## 2019-02-22 ENCOUNTER — Ambulatory Visit (INDEPENDENT_AMBULATORY_CARE_PROVIDER_SITE_OTHER)
Admission: RE | Admit: 2019-02-22 | Discharge: 2019-02-22 | Disposition: A | Discharge status: Home / Self Care | Payer: Medicaid Other | Source: Ambulatory Visit

## 2019-02-22 DIAGNOSIS — R51 Headache: Secondary | ICD-10-CM

## 2019-02-22 DIAGNOSIS — R52 Pain, unspecified: Secondary | ICD-10-CM

## 2019-02-22 DIAGNOSIS — R519 Headache, unspecified: Secondary | ICD-10-CM

## 2019-02-22 NOTE — Discharge Instructions (Signed)
As discussed, cannot rule out COVID. Currently, no alarming signs. Testing ordered. Please go to [place] tomorrow between 8am-4pm for testing. I would like you to quarantine until testing results. You can use over the counter flonse for nasal congestion/sinus headache. Continue tylenol/motrin for fever, body aches, headache. If experiencing shortness of breath, trouble breathing, call 911 and provide them with your current situation. If experiencing neck stiffness, vomiting despite medicine, confusion, go to the ED for further evaluation needed.

## 2019-02-22 NOTE — ED Provider Notes (Signed)
Virtual Visit via Video Note:  [name]  initiated request for Telemedicine visit with [facility] team. I connected with Jason Davidson  on 02/22/2019 at 4:47 PM  for a synchronized telemedicine visit using a video enabled HIPPA compliant telemedicine application. I verified that I am speaking with Jason Davidson  using two identifiers. Jason Davidson, Jason Davidson  was physically located in a [facility] Urgent care site and Jason Davidson was located at a different location.   The limitations of evaluation and management by telemedicine as well as the availability of in-person appointments were discussed. Patient was informed that he  may incur a bill ( including co-pay) for this virtual visit encounter. Jason Davidson  expressed understanding and gave verbal consent to proceed with virtual visit.     History of Present Illness:Jason Davidson  is a 38 y.o. male presents with 2 day history of headaches, body aches. Today with temp of 100.2, and has had felt more fatigued. Denies rhinorrhea, nasal congestion, cough, sore throat. Nausea without vomiting. He has been able to tolerate oral intake with use of promethazine and zofran. Has had few episodes of diarrhea. Denies melena, hematochezia. Denies abdominal pain. Denies neck stiffness, confusion, photophobia. Denies shortness of breath, loss of taste/smell. Son and wife recently got tested for COVID with negative results. Ibuprofen with temporary relief.  Patient with uncontrolled DM on insulin. States CBG ranging 150-547. Fasting CBG today 151.      Past Medical History:  Diagnosis Date  . Asthma   . Depressed   . Diabetes mellitus without complication ([place])   . IBS (irritable bowel syndrome)   . Morbid obesity (HCC)     Allergies  Allergen Reactions  . Erythromycin Nausea And Vomiting  . Penicillins Swelling  . Sulfa Antibiotics Nausea And Vomiting        Observations/Objective: General: Well  appearing, nontoxic, no acute distress. Sitting comfortably. Head: Normocephalic, atraumatic Eye: No conjunctival injection, eyelid swelling. EOMI. No photophobia. ENT: Mucus membranes moist, no lip cracking. No obvious nasal drainage. Patient tender to palpation of frontal sinus.  Neck: Full ROM of neck. No neck stiffness Pulm: Speaking in full sentences without difficulty. Normal effort. No respiratory distress, accessory muscle use. Neuro: Normal mental status. Alert and oriented x 3.  CBG by patient during visit 113   Assessment and Plan: No alarming signs on exam. Lower suspicion for meningitis as no neck stiffness/photophobia. Lower suspicion for DKA/HHS given 113 CBG. Discussed although wife/son negative for COVID, given current symptoms with uncontrolled DM, would prefer to obtain COVID testing. Patient agreeable, testing ordered. Discussed symptomatic treatment. Push fluids. Return precautions given. Patient expresses understanding and agrees to plan.  Follow Up Instructions:    I discussed the assessment and treatment plan with the patient. The patient was provided an opportunity to ask questions and all were answered. The patient agreed with the plan and demonstrated an understanding of the instructions.   The patient was advised to call back or seek an in-person evaluation if the symptoms worsen or if the condition fails to improve as anticipated.  I provided 15 minutes of non-face-to-face time during this encounter.    Jason Edwards, Jason Davidson  02/22/2019 4:47 PM         Jason Edwards, Jason Davidson 02/22/19 1714

## 2019-02-24 ENCOUNTER — Other Ambulatory Visit: Payer: Self-pay | Admitting: *Deleted

## 2019-02-24 DIAGNOSIS — Z20822 Contact with and (suspected) exposure to covid-19: Secondary | ICD-10-CM

## 2019-02-25 LAB — NOVEL CORONAVIRUS, NAA: SARS-CoV-2, NAA: NOT DETECTED

## 2019-02-25 MED ORDER — EMPTY CONTAINER
2 refills | 0 days
Start: 2019-02-25 — End: ?

## 2019-02-25 NOTE — Unmapped (Signed)
[facility] Refill Coordination Note    Specialty Medication(s) to be Shipped:   Inflammatory Disorders: Humira    Other medication(s) to be shipped: na     Logan Quinn, DOB: Nov 28, 1980  Phone: [phone] (home)       All above HIPAA information was verified with patient.     Completed refill call assessment today to schedule patient's medication shipment from the [facility] [phone]).       Specialty medication(s) and dose(s) confirmed: Regimen is correct and unchanged.   Changes to medications: Logan Quinn reports no changes at this time.  Changes to insurance: No  Questions for the pharmacist: No    Confirmed patient received Welcome Packet with first shipment. The patient will receive a drug information handout for each medication shipped and additional FDA Medication Guides as required.       DISEASE/MEDICATION-SPECIFIC INFORMATION        For patients on injectable medications: Patient currently has 1 doses left.  Next injection is scheduled for [id number].    SPECIALTY MEDICATION ADHERENCE     Medication Adherence    Patient reported X missed doses in the last month: 0  Specialty Medication: humira cf 40 mg/0.4 ml  Patient is on additional specialty medications: No  Patient is on more than two specialty medications: No  Any gaps in refill history greater than 2 weeks in the last 3 months: no  Demonstrates understanding of importance of adherence: yes  Informant: patient  Reliability of informant: reliable  Confirmed plan for next specialty medication refill: delivery by pharmacy  Refills needed for supportive medications: not needed                humira cf 40 mg/0.4 ml. 7 days on hand      SHIPPING     Shipping address confirmed in Epic.     Delivery Scheduled: Yes, Expected medication delivery date: 092120.     Medication will be delivered via Same Day Courier to the home address in Epic WAM.    [name]   [facility] Specialty Technician

## 2019-03-01 MED FILL — HUMIRA PEN CITRATE FREE 40 MG/0.4 ML: 28 days supply | Qty: 8 | Fill #3 | Status: AC

## 2019-03-01 MED FILL — EMPTY CONTAINER: 60 days supply | Qty: 1 | Fill #0 | Status: AC

## 2019-03-01 MED FILL — EMPTY CONTAINER: 60 days supply | Qty: 1 | Fill #0

## 2019-03-01 MED FILL — HUMIRA PEN CITRATE FREE 40 MG/0.4 ML: 28 days supply | Qty: 8 | Fill #3

## 2019-03-02 DIAGNOSIS — N3281 Overactive bladder: Secondary | ICD-10-CM

## 2019-03-02 DIAGNOSIS — N3944 Nocturnal enuresis: Secondary | ICD-10-CM

## 2019-03-04 DIAGNOSIS — N3944 Nocturnal enuresis: Secondary | ICD-10-CM

## 2019-03-04 DIAGNOSIS — N3281 Overactive bladder: Secondary | ICD-10-CM

## 2019-03-04 MED ORDER — OXYBUTYNIN CHLORIDE 5 MG TABLET
ORAL_TABLET | Freq: Two times a day (BID) | ORAL | 1 refills | 90 days | Status: CP
Start: 2019-03-04 — End: 2020-03-03

## 2019-03-10 ENCOUNTER — Ambulatory Visit (INDEPENDENT_AMBULATORY_CARE_PROVIDER_SITE_OTHER): Payer: Medicaid Other | Admitting: Nurse Practitioner

## 2019-03-10 ENCOUNTER — Other Ambulatory Visit: Payer: Self-pay

## 2019-03-10 ENCOUNTER — Ambulatory Visit (INDEPENDENT_AMBULATORY_CARE_PROVIDER_SITE_OTHER): Payer: Medicaid Other

## 2019-03-10 DIAGNOSIS — I83813 Varicose veins of bilateral lower extremities with pain: Secondary | ICD-10-CM

## 2019-03-15 ENCOUNTER — Encounter (INDEPENDENT_AMBULATORY_CARE_PROVIDER_SITE_OTHER): Payer: Self-pay

## 2019-03-15 NOTE — Unmapped (Signed)
P/C requesting refill on Flexeril , last ordered on 02/06/18, last visit 02/08/2019.

## 2019-03-16 ENCOUNTER — Other Ambulatory Visit (INDEPENDENT_AMBULATORY_CARE_PROVIDER_SITE_OTHER): Payer: Self-pay | Admitting: Nurse Practitioner

## 2019-03-16 MED ORDER — CYCLOBENZAPRINE 10 MG TABLET
ORAL_TABLET | Freq: Three times a day (TID) | ORAL | 0 refills | 20.00000 days | Status: CP | PRN
Start: 2019-03-16 — End: 2020-03-15

## 2019-03-23 ENCOUNTER — Other Ambulatory Visit (INDEPENDENT_AMBULATORY_CARE_PROVIDER_SITE_OTHER): Payer: Self-pay | Admitting: Nurse Practitioner

## 2019-03-23 ENCOUNTER — Telehealth (INDEPENDENT_AMBULATORY_CARE_PROVIDER_SITE_OTHER): Payer: Self-pay | Admitting: Nurse Practitioner

## 2019-03-23 NOTE — Telephone Encounter (Signed)
I spoke to the patient by phone in regards to the follow-up studies he did regarding complaints of redness and pain in his bilateral lower extremities.  He states that his right continues to be worse than his left.  The patient continues to use medical grade 1 compression stockings on a daily basis and he has used them ever since his endovenous laser ablation.  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  He utilizes NSAIDs for pain however they are not effective and treating his pain.  This pain is also preventing him from doing certain activities of daily living such as grocery shopping and housecleaning.  He denies any fever, chills, nausea, vomiting or diarrhea.  He denies any chest pain or shortness of breath.  The patient continues to have edema consistent with the previous examination, with mild venous stasis changes bilaterally and 3+ edema bilaterally.  Today the patient underwent noninvasive studies which show that the previous right great saphenous vein ablation is intact, however the patient has a prominent accessory vein.  There is no evidence of DVT or superficial venous thrombosis bilaterally.  There is no reflux in the left lower extremity.  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  Patient should undergo injection foam sclerotherapy to treat the residual varicosities of the right lower extremity.  The risks, benefits and alternative therapies were reviewed in detail with the patient.  All questions were answered.  The patient agrees to proceed with sclerotherapy at their convenience.  The patient will continue wearing the graduated compression stockings and using the  over-the-counter pain medications to treat her symptoms.

## 2019-03-24 ENCOUNTER — Telehealth
Admit: 2019-03-24 | Discharge: 2019-03-24 | Payer: MEDICAID | Attending: Physician Assistant | Primary: Physician Assistant

## 2019-03-24 ENCOUNTER — Ambulatory Visit: Admit: 2019-03-24 | Discharge: 2019-03-24 | Payer: MEDICAID | Attending: Dermatology | Primary: Dermatology

## 2019-03-24 DIAGNOSIS — L732 Hidradenitis suppurativa: Principal | ICD-10-CM

## 2019-03-24 DIAGNOSIS — Z6841 Body Mass Index (BMI) 40.0 and over, adult: Principal | ICD-10-CM

## 2019-03-24 DIAGNOSIS — E611 Iron deficiency: Principal | ICD-10-CM

## 2019-03-24 DIAGNOSIS — E039 Hypothyroidism, unspecified: Principal | ICD-10-CM

## 2019-03-24 DIAGNOSIS — R7989 Other specified abnormal findings of blood chemistry: Principal | ICD-10-CM

## 2019-03-24 DIAGNOSIS — E559 Vitamin D deficiency, unspecified: Principal | ICD-10-CM

## 2019-03-24 LAB — FERRITIN: Ferritin:MCnc:Pt:Ser/Plas:Qn:: 87.7

## 2019-03-24 LAB — TOTAL IRON BINDING CAPACITY (CALC): Lab: 307.8

## 2019-03-24 LAB — TESTOSTERONE TOTAL: Testosterone:MCnc:Pt:Ser/Plas:Qn:: 122 — ABNORMAL LOW

## 2019-03-24 LAB — THYROID STIMULATING HORMONE: Thyrotropin:ACnc:Pt:Ser/Plas:Qn:: 2.026

## 2019-03-24 LAB — IRON & TIBC
IRON: 82 ug/dL (ref 35–165)
TRANSFERRIN: 244.3 mg/dL (ref 200.0–380.0)

## 2019-03-24 MED ORDER — MISCELLANEOUS MEDICAL SUPPLY MISC: each | 0 refills | 0 days | Status: AC

## 2019-03-24 MED ORDER — OXYCODONE-ACETAMINOPHEN 5 MG-325 MG TABLET: 1 | tablet | Freq: Four times a day (QID) | 0 refills | 3 days | Status: AC

## 2019-03-24 MED ORDER — HUMIRA PEN CITRATE FREE 40 MG/0.4 ML: each | 3 refills | 0 days | Status: AC

## 2019-03-24 NOTE — Unmapped (Addendum)
You are doing excellent on CPAP and congratulate you.  As I mentioned I am giving you some guidelines for cleaning your device as well as replacement of your accessories.    A general guideline on CPAP accessory replacements  ?? If a CPAP mask has an insert, that gets changed out monthly (nasal pillows, etc)  ?? In general, CPAP masks are changed every 3 months.   ?? The CPAP filters gets changed roughly twice monthly  ?? The tubing is changed between 3-6 months  ?? The water chamber is usually changed every 6 months    In general, your CPAP mask to be cleaned daily and your tubing and water chamber should be cleaned weekly.  ? Gently rub with soap and warm, drinking-quality water.  Avoid using stronger cleaning products as it may leave harmful residue  ? Rinse again thoroughly with warm, drinking-quality water.  ? You can let us try on a flat surface on top of a towel to dry.  With the CPAP tubing, I recommend shaking this out thoroughly and hanging it over the shower curtain to allow it to dry       Sleep: I think this can be improved:  ?? Change your sleep time to 11 PM???8 AM  ?? Change the timing of your levothyroxine to the morning time.  To properly transition I recommend that you take it this afternoon today and then start taking it in the morning tomorrow.    RLS:  ?? I am ordering some labs and you can have this at your primary care provider's office or you can have it at any [facility] facility with a lab.  I need to check your iron saturation as well as your ferritin levels.  If your ferritin is less than 75, I will treat with iron supplementation and vitamin C  ?? I do recommend magnesium 250-500 mg daily  ?? You can take a warm bath/shower before your bedtime

## 2019-03-24 NOTE — Unmapped (Addendum)
Date of Service: 03/24/2019     Patient Name: Logan Quinn       MRN: [id number]       Date of Birth: 11-03-1980  Primary Care Physician: [name], FNP  Referring Provider: Loran Senters, *  DME [facility] [facility]    Assessment and Plan:   Impression:   Logan Quinn is a 38 y.o. male with severe obstructive sleep apnea, sleep difficulty with RLS.    Severe obstructive sleep apnea on CPAP: Logan Quinn has severe obstructive sleep apnea and was rediagnosed via split study in 2018 with an AHI of about 50 /HR.  It was recommended that he be treated with CPAP 16 cm H2O EPR 2.  He is quite compliant on his CPAP device and continues to benefit from its use.  He has no complaints with his CPAP.    RLS: While he denied RLS symptoms during his last visit, his symptoms are consistent with this and his impacting his sleep quality.  I discussed with him the following:  ?? I am ordering some labs and you can have this at your primary care provider's office or you can have it at any [facility] facility with a lab.  I need to check your iron saturation as well as your ferritin levels.  If your ferritin is less than 75, I will treat with iron supplementation and vitamin C  ?? I do recommend magnesium 250-500 mg daily  ?? You can take a warm bath/shower before your bedtime    Sleep disturbance: He has known insomnia which is being treated by his psychiatrist.  He is also in bed for an excessively long period of time about 11-12 hours and takes his levothyroxine medication at night.  I discussed with him the following:  ?? Change your sleep time to 11 PM???8 AM  ?? Change the timing of your levothyroxine to the morning time.  To properly transition I recommend that you take it this afternoon today and then start taking it in the morning tomorrow.    I spent 25 minutes on the real-time audio and video with the patient. I spent an additional 10 minutes on pre- and post-visit activities. The patient was physically located in [place] or a state in which I am permitted to provide care. The patient agreed to this telemedicine visit which was appropriate and reasonable under the circumstances given the patient's presentation at the time.    The patient has been advised of the limitations of this mode of treatment (including, but not limited to, the absence of in-person examination) and has agreed to be treated using telemedicine. The patient's/patient's family's questions regarding telemedicine have been answered.     If the phone/video visit was completed in an ambulatory setting, the patient has also been advised to contact their provider???s office for worsening conditions, and seek emergency medical treatment and/or call 911 if the patient deems either necessary.        Subjective:   CC: Follow-up visit    History of Present Illness:     Logan Quinn is a 38 y.o. male and was evaluated by me on 03/26/2018. Logan Quinn was diagnosed with sleep apnea in 2012 and started on Pap therapy but had difficulty with compliance.  He needed to requalify for Pap therapy and a split study that was completed 02/26/2017 and was found to have severe obstructive sleep apnea with an AHI of about 50 /HR.  The treatment  portion recommended CPAP 16 cm H2O EPR 2.  Significant periodic limb movements were noted that correlated with his RLS.  He is currently under the care of a psychiatrist for his insomnia and is currently taking trazodone 200 mg as a sleep aid.  With regard to CPAP he is quite compliant using her machine nightly and continuing to benefit from its use.  He keeps his CPAP device clean and continues to replace his accessories on a regular basis.  He does feel that his restless leg symptoms have returned as much as once a week.  He typically goes to bed between 830???9 PM and it can take him 30 minutes to an hour to fall asleep.  Sometimes extended period of time is because of his restless leg symptoms.  When he takes his Ditropan, he tends to sleep through the night but if not he will wake up to 3 times to urinate.  He wakes up for the day about 8 AM.  He feels relatively refreshed when waking up and denies any daytime sleepiness nor napping.  Caffeine is very little and may occasionally have a hot chocolate.  He denies any alcohol nor any tobacco products .      Review of Systems:  A 14-system review was completed and found to be negative except for what is mentioned in the HPI.     PMH:  Past Medical History:   Diagnosis Date   ??? Acne    ??? Allergic    ??? Anxiety    ??? Depression    ??? Diabetes mellitus (CMS-HCC)    ??? GERD (gastroesophageal reflux disease)    ??? Gout    ??? Hypertension    ??? IBS (irritable bowel syndrome)    ??? 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 Liver disease    ??? Migraines    ??? Morbid obesity with BMI of 60.0-69.9, adult (CMS-HCC)    ??? 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 Obstructive sleep apnea    ??? OSA on CPAP ??? Severe obstructive sleep apnea    ??? Trapezius muscle strain 12/07/2013   ??? Urinary incontinence, nocturnal enuresis    ??? Venous insufficiency        Past Surgical Hx:  Past Surgical History:   Procedure Laterality Date   ??? EYE SURGERY  11/15   ??? PR COLONOSCOPY FLX DX W/COLLJ SPEC WHEN PFRMD N/A 03/24/2013    Procedure: COLONOSCOPY, FLEXIBLE, PROXIMAL TO SPLENIC FLEXURE; DIAGNOSTIC, W/WO COLLECTION SPECIMEN BY BRUSH OR WASH;  Surgeon: Clint Bolder, MD;  [facility];  Service: Gastroenterology   ??? PR EYE SURG POST SGMT PROC UNLISTED Left     pneumatic retinopexy OS   ??? PR UPPER GI ENDOSCOPY,DIAGNOSIS N/A 02/02/2013    Procedure: UGI ENDO, INCLUDE ESOPHAGUS, STOMACH, & DUODENUM &/OR JEJUNUM; DX W/WO COLLECTION SPECIMN, BY BRUSH OR WASH;  Surgeon: Malcolm Metro, MD;  Location: [facility];  Service: Gastroenterology   ??? [place] PYLORIC STENOSIS ([place] HISTORICAL RESULT)         FamHx:  Family History   Problem Relation Age of Onset   ??? Cancer Maternal Grandfather    ??? Hearing loss Maternal Grandfather    ??? Cancer Paternal Grandfather    ??? COPD Paternal Grandmother    ??? Arthritis Paternal Grandmother    ??? Depression Paternal Grandmother    ??? Diabetes Mother    ??? Heart disease Mother    ??? Migraines Mother    ???  Arthritis Mother    ??? Depression Mother    ??? GER disease Mother    ??? Hypertension Mother    ??? Angina Mother    ??? COPD Mother    ??? Glaucoma Mother    ??? Diabetes Sister    ??? Migraines Sister    ??? Asthma Sister    ??? Depression Sister    ??? Diabetes Brother    ??? Asthma Brother    ??? Diabetes Maternal Grandmother    ??? Heart disease Maternal Grandmother    ??? Migraines Maternal Grandmother    ??? Depression Maternal Grandmother    ??? Angina Maternal Grandmother    ??? Hypertension Maternal Grandmother    ??? Diabetes Maternal Uncle    ??? Diabetes Maternal Uncle    ??? Liver disease Maternal Uncle    ??? Kidney disease Maternal Uncle    ??? Asthma Brother    ??? Colorectal Cancer Neg Hx ??? Esophageal cancer Neg Hx    ??? Liver cancer Neg Hx    ??? Pancreatic cancer Neg Hx    ??? Stomach cancer Neg Hx    ??? Glaucoma Neg Hx    ??? Amblyopia Neg Hx    ??? Blindness Neg Hx    ??? Retinal detachment Neg Hx    ??? Strabismus Neg Hx    ??? Macular degeneration Neg Hx    ??? Anesthesia problems Neg Hx    ??? Broken bones Neg Hx    ??? Clotting disorder Neg Hx    ??? Collagen disease Neg Hx    ??? Dislocations Neg Hx    ??? Fibromyalgia Neg Hx    ??? Gout Neg Hx    ??? Hemophilia Neg Hx    ??? Osteoporosis Neg Hx    ??? Rheumatologic disease Neg Hx    ??? Scoliosis Neg Hx    ??? Severe sprains Neg Hx    ??? Sickle cell anemia Neg Hx    ??? Spinal Compression Fracture Neg Hx    ??? Melanoma Neg Hx    ??? Basal cell carcinoma Neg Hx    ??? Squamous cell carcinoma Neg Hx    ??? Deep vein thrombosis Neg Hx        Social History:  Social History     Socioeconomic History   ??? Marital status: Married     Spouse name: Not on file   ??? Number of children: Not on file   ??? Years of education: Not on file   ??? Highest education level: Not on file   Occupational History   ??? Not on file   Social Needs   ??? Financial resource strain: Not on file   ??? Food insecurity     Worry: Not on file     Inability: Not on file   ??? Transportation needs     Medical: Not on file     Non-medical: Not on file   Tobacco Use   ??? Smoking status: Never Smoker   ??? Smokeless tobacco: Never Used   Substance and Sexual Activity   ??? Alcohol use: Never     Alcohol/week: 0.0 standard drinks     Comment: rare social   ??? Drug use: Never   ??? Sexual activity: Yes     Partners: Female     Birth control/protection: None   Lifestyle   ??? Physical activity     Days per week: Not on file     Minutes per session: Not on file   ???  Stress: Not on file   Relationships   ??? Social Wellsite geologist on phone: Not on file     Gets together: Not on file     Attends religious service: Not on file     Active member of club or organization: Not on file     Attends meetings of clubs or organizations: Not on file Relationship status: Not on file   Other Topics Concern   ??? Do you use sunscreen? Yes   ??? Tanning bed use? No   ??? Are you easily burned? No   ??? Excessive sun exposure? No   ??? Blistering sunburns? No   Social History Narrative   ??? Not on file       Medications:    Current Outpatient Medications:   ???  acetaminophen (TYLENOL) 500 MG tablet, Take 2 tablets (1,000 mg total) by mouth Three (3) times a day., Disp: 30 tablet, Rfl: 0  ???  ADVAIR DISKUS 500-50 mcg/dose diskus, Inhale 1 puff 2 (two) times a day., Disp: 13 Inhaler, Rfl: 1  ???  ALPRAZolam (XANAX) 0.5 MG tablet, Take 1 tablet (0.5 mg total) by mouth Three (3) times a day. (Patient taking differently: Take 1 mg by mouth Three (3) times a day. Usually takes nightly for anxiety), Disp: 60 tablet, Rfl: 0  ???  blood sugar diagnostic Strp, by Other route Three (3) times a day before meals., Disp: 300 each, Rfl: 1  ???  blood-glucose meter kit, Use as directed, Disp: 1 each, Rfl: 0  ???  cephalexin (KEFLEX) 500 MG capsule, Take 1 capsule (500 mg total) by mouth Two (2) times a day., Disp: 60 capsule, Rfl: 0  ???  cetirizine (ZYRTEC) 10 MG tablet, Take 1 tablet (10 mg total) by mouth nightly as needed for allergies., Disp: 90 tablet, Rfl: 1  ???  clotrimazole-betamethasone (LOTRISONE) 1-0.05 % cream, Apply topically Two (2) times a day., Disp: 60 g, Rfl: 1  ???  cyclobenzaprine (FLEXERIL) 10 MG tablet, Take 1 tablet (10 mg total) by mouth Three (3) times a day as needed for muscle spasms., Disp: 60 tablet, Rfl: 0  ???  diclofenac (VOLTAREN) 50 MG EC tablet, Take 1 tablet (50 mg total) by mouth two (2) times a day as needed., Disp: 60 each, Rfl: 2  ???  dicyclomine (BENTYL) 10 mg capsule, Take 1 capsule (10 mg total) by mouth Three (3) times a day., Disp: 270 capsule, Rfl: 1  ???  diphenoxylate-atropine (LOMOTIL) 2.5-0.025 mg per tablet, Take 1 tablet by mouth two (2) times a day as needed for diarrhea., Disp: 30 tablet, Rfl: 5 ???  empty container (SHARPS CONTAINER) Misc, Use as directed to dispose of Humira needles, Disp: 1 each, Rfl: 2  ???  empty container Misc, Use as directed to dispose of Humira injections, Disp: 1 each, Rfl: 2  ???  erenumab-aooe (AIMOVIG AUTOINJECTOR) 70 mg/mL AtIn, Inject 1 mL under the skin every twenty-eight (28) days., Disp: 3 Syringe, Rfl: 1  ???  ergocalciferol (DRISDOL) 1,250 mcg (50,000 unit) capsule, Take 1 capsule (50,000 Units total) by mouth once a week., Disp: 4 capsule, Rfl: 2  ???  famotidine (PEPCID) 20 MG tablet, Take 1 tablet (20 mg total) by mouth Two (2) times a day., Disp: 180 tablet, Rfl: 1  ???  fluticasone propionate (FLONASE) 50 mcg/actuation nasal spray, 1 spray each nare daily, Disp: 3 Bottle, Rfl: 3  ???  glipiZIDE (GLUCOTROL) 10 MG tablet, Take 2 tablets (20 mg total)  by mouth Two (2) times a day (30 minutes before a meal)., Disp: 360 tablet, Rfl: 1  ???  HUMIRA PEN CITRATE FREE 40 MG/0.4 ML, Inject the contents of 2 pens (80mg  total) under the skin every seven (7) days., Disp: 8 each, Rfl: 3  ???  hydrocortisone (ANUSOL-HC) 25 mg suppository, Insert 1 suppository (25 mg total) into the rectum Two (2) times a day., Disp: 24 each, Rfl: 1  ???  hydrocortisone (PROCTOSOL HC) 2.5 % rectal cream, Insert 1 application into the rectum two (2) times a day as needed., Disp: 28.35 g, Rfl: 2  ???  ibuprofen (ADVIL,MOTRIN) 800 MG tablet, Take 1 tablet (800 mg total) by mouth every eight (8) hours as needed., Disp: 90 tablet, Rfl: 1  ???  insulin glargine (LANTUS U-100 INSULIN) 100 unit/mL injection, Inject 0.8 mL (80 Units total) under the skin nightly., Disp: 72 mL, Rfl: 1  ???  insulin regular (HUMULIN R REGULAR U-100 INSULN) 100 unit/mL injection, SLIDING SCALE BLOOD SUGAR 0-150=0 UNITS,150-200=3 UNITS, 200-250=6 UNITS,250-300=9 UNITS, 300-350=12 UNITS, Disp: 40 mL, Rfl: 1  ???  insulin syringe-needle U-100 (BD INSULIN SYRINGE ULTRA-FINE) 1 mL 31 gauge x 5/16 (8 mm) Syrg, USE AS DIRECTED., Disp: 100 each, Rfl: 1 ???  ketoconazole (NIZORAL) 2 % cream, Apply 1 application topically daily., Disp: 15 g, Rfl: 11  ???  lancets Misc, 1 each by Miscellaneous route Three (3) times a day before meals., Disp: 300 each, Rfl: 1  ???  levothyroxine (SYNTHROID) 50 MCG tablet, Take 1 tablet (50 mcg total) by mouth daily., Disp: 90 tablet, Rfl: 1  ???  MITIGARE 0.6 mg cap capsule, Take 1.2 mg on Day 1 of flare, followed by 0.6 mg one hour later. Day 2 take 0.6 mg BID until flare resolves., Disp: 60 capsule, Rfl: 3  ???  montelukast (SINGULAIR) 10 mg tablet, Take 1 tablet (10 mg total) by mouth daily., Disp: 90 each, Rfl: 1  ???  oxybutynin (DITROPAN) 5 MG tablet, Take 1 tablet (5 mg total) by mouth Two (2) times a day., Disp: 180 tablet, Rfl: 1  ???  PROAIR HFA 90 mcg/actuation inhaler, Inhale 2 puffs every four (4) hours as needed for wheezing., Disp: 3 Inhaler, Rfl: 1  ???  promethazine (PHENERGAN) 25 MG tablet, Take 1-2 tablets (25-50 mg total) by mouth nightly as needed for nausea., Disp: 30 each, Rfl: 2  ???  propranoloL (INDERAL LA) 120 mg 24 hr capsule, Take 1 capsule (120 mg total) by mouth daily., Disp: 90 capsule, Rfl: 1  ???  topiramate (TOPAMAX) 25 MG tablet, Take 1 tablet (25 mg total) by mouth Two (2) times a day., Disp: 180 tablet, Rfl: 1  ???  traZODone (DESYREL) 100 MG tablet, take 2 tablets by mouth at bedtime, Disp: , Rfl: 0  ???  albuterol 2.5 mg /3 mL (0.083 %) nebulizer solution, Inhale 3 mL (2.5 mg total) by nebulization every four (4) hours as needed for wheezing. (Patient not taking: Reported on 03/24/2019), Disp: 25 vial, Rfl: 2  ???  albuterol HFA 90 mcg/actuation inhaler, Inhale 2 puffs every six (6) hours as needed for wheezing. (Patient not taking: Reported on 03/24/2019), Disp: 3 Inhaler, Rfl: 1    Allergies:  Allergies   Allergen Reactions   ??? Erythromycin Nausea And Vomiting     Other reaction(s): Vomiting   ??? Penicillins Rash, Swelling and Hives   ??? Sulfa (Sulfonamide Antibiotics) Nausea And Vomiting and Nausea Only   ??? Other ??? Erythromycin Base Rash   ???  Sulfasalazine Nausea And Vomiting            Objective:     Epworth Sleepiness Scale: 6    CPAP compliance and therapy notes:  02/22/2019???03/23/2019: 30 days  >4 hours usage: 100%  Average usage on days used: 8 hours 2 minutes  Setting: CPAP 16 cm H2O EPR 2  AHI 1.7    Awake alert appropriate  EOMI  Tongue midline  Modified Mallampati: III  Face symmetric  Clear speech  Follows commands  Moves all extremities spontaneously and antigravity      Data/ Lab/ Inventory Review:   Previous sleep study and chart notes reviewed  History obtained from patient  Imaging   Blood/ serum laboratory tests  No results found for: CBC  Ferritin   Date Value Ref Range Status   11/25/2017 60.4 27.0 - 377.0 ng/mL Final     Iron Saturation (%)   Date Value Ref Range Status   11/25/2017 21 20 - 50 % Final     TSH   Date Value Ref Range Status   08/20/2018 2.940 0.600 - 3.300 uIU/mL Final     Free T4   Date Value Ref Range Status   07/08/2018 0.94 0.71 - 1.40 ng/dL Final     No results found for: VITAMINB12  CO2   Date Value Ref Range Status   04/07/2018 26.0 22.0 - 30.0 mmol/L Final           Author:  Patrcia Dolly 03/24/2019 11:51 AM    Note - This record has been created using [employer]. Chart creation errors have been sought, but may not always have been located. Such creation errors do not reflect on the standard of medical care.

## 2019-03-24 NOTE — Unmapped (Signed)
[facility] Refill Coordination Note    Specialty Medication(s) to be Shipped:   Inflammatory Disorders: Humira    Other medication(s) to be shipped: na     Logan Quinn, DOB: 05/17/1981  Phone: [phone] (home)       All above HIPAA information was verified with patient.     Completed refill call assessment today to schedule patient's medication shipment from the [facility] [phone]).       Specialty medication(s) and dose(s) confirmed: Regimen is correct and unchanged.   Changes to medications: Logan Quinn reports no changes at this time.  Changes to insurance: No  Questions for the pharmacist: No    Confirmed patient received Welcome Packet with first shipment. The patient will receive a drug information handout for each medication shipped and additional FDA Medication Guides as required.       DISEASE/MEDICATION-SPECIFIC INFORMATION        For patients on injectable medications: Patient currently has 1 doses left.  Next injection is scheduled for 101520.    SPECIALTY MEDICATION ADHERENCE     Medication Adherence    Patient reported X missed doses in the last month: 0  Specialty Medication: humira cf 40 mg/0.4 ml   Patient is on additional specialty medications: No  Any gaps in refill history greater than 2 weeks in the last 3 months: no  Demonstrates understanding of importance of adherence: yes  Informant: patient  Reliability of informant: reliable  Confirmed plan for next specialty medication refill: delivery by pharmacy  Refills needed for supportive medications: not needed                humira cf 40 mg/0.4 ml . 7 days on hand      SHIPPING     Shipping address confirmed in Epic.     Delivery Scheduled: Yes, Expected medication delivery date: 101920.  However, Rx request for refills was sent to the provider as there are none remaining.     Medication will be delivered via Same Day Courier to the home address in Epic WAM.    Logan Quinn

## 2019-03-24 NOTE — Unmapped (Signed)
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  Following alcohol prep, anesthesia with 0.5% lidocaine with epinephrine was first performed with a total of 20 ml, and then fistula probe and/or forceps was used to delineate the involved area.  Iris scissor was used to open all detectable sinuses. Beveling was performed with scissor or blade as appropriate to assure second intention healing.  Hemostasis was obtained in the usual fashion with pressure, AlCl solution, and/or cautery.  Area was dressed with petrolatum and bandage.  Wound care instructions given.  We will contact the patient with results when available.      Size of wound A (R proximal groin) 20 x 20 x 2 mm.   Size of wound B (R distal groin) 24 x 20 x 2 mm.    For pain:  - oxyCODONE-acetaminophen (PERCOCET) 5-325 mg per tablet; Take 1 tablet by mouth every six (6) hours as needed for pain.  R/B/A reviewed including but not limited to sedation.     Patient will RTC for wound check in 2 weeks    The patient was seen and examined by Venia Carbon, MD who agrees with the assessment and plan as above.

## 2019-03-24 NOTE — Unmapped (Signed)
Managing your wound after skin surgery    Please avoid strenuous activity for 48 hours and all heavy exercise for 1 week  If your wound is on your arm, leg, or shoulder, then avoid heavy lifting, straining, or exercise with that limb for at least one week.  If your wound is on the head or neck, avoid bending over if at all possible.  Sleep with your head elevated for 48 hours to reduce the risk of bleeding.  Please don???t smoke for 3 weeks; smoking prevents healthy wound healing  Pain management: Use  600 mg of acetaminophen (Tylenol ) every 4 hours, or medication we prescribed as needed. Pain should decrease steadily for the 1st few days after your surgery.   Possible complications:  Bleeding: a small amount of blood on the bandage is normal.   If there is a significant bleeding into the bandage or beneath the stitches (this will look like swelling and purple discoloration), apply firm pressure  with a cloth or bandage for 20 minutes without interruption. Repeat if bleeding continues. If it persists, please call [phone]. During nonoffice hours use the message of this number to contact the on-call dermatologist or go to the emergency room.  Infection: usually indicated by pain that increases 4 to 6 days after surgery. Mild redness, swelling, soreness are normal, but, with increasing redness, pain, drainage, and swelling, you should contact our office.  Decreased sensation, increased sensitivity, and itching may last for 18 months.  Scarring: scars mature for one year after surgery. Reducing motion and tension over the wound for the 1st month, as well as using sun protection, reduces scarring.  Bruising is normal around the wound and resolves over 2 to 3 weeks.  For wounds with dissolving stitches:  Please keep the original bandage in place for at least 48 hours. You can remove the large outer layer at that time and keep the small bandage clean and dry for one week or until you return. If the  bandage becomes soiled, wet, or detached, you can reinforce it with tape or add petrolatum  (Vaseline) to the stitches, then place a fresh, clean, nonstick bandage (telfa) with tape or use a non stick Band-Aid.  For wounds with stitches that need to be removed:  Please keep the original bandage in place for at least 48 hours. You can remove the large outer bandage at that time. You may leave the smaller bandage in place until it falls off by itself, or change it after it gets wet with bathing. To change, apply a layer of petrolatum (Vaseline) over top of the Steri-Strips (thin white stripes) and cover with a nonstick stick bandage (telfa) and  paper tape or use a non stick Band-Aid.  If you???re changing the bandage, you can gently remove any crust with warm water and mild soap using a soft cloth a Q-tip. Do not use triple antibiotic ointment, Neosporin, or hydrogen peroxide.  After the stitches and bandages have been removed: clean daily with water with warm water and gentle soap using a soft cloth. You can continue to use a small bandage and sunscreen for one to 2 months or until fully healed.    If instructed to use vinegar soaks, use a fresh bottle of white vinegar and bottled water to make the soak  Please add 1 cup of white vinegar to a quart of room temperature water.  Soak affected area for 10-20 minutes once or twice a day.

## 2019-03-26 DIAGNOSIS — G4733 Obstructive sleep apnea (adult) (pediatric): Principal | ICD-10-CM

## 2019-03-26 DIAGNOSIS — L732 Hidradenitis suppurativa: Principal | ICD-10-CM

## 2019-03-26 MED ORDER — OXYCODONE-ACETAMINOPHEN 5 MG-325 MG TABLET: 1 | tablet | Freq: Four times a day (QID) | 0 refills | 2 days | Status: AC

## 2019-03-26 NOTE — Unmapped (Signed)
Pt is asking for a referral for ent to be sent.

## 2019-03-26 NOTE — Unmapped (Signed)
Do you know who is providing his wound care items?

## 2019-03-27 LAB — VITAMIN D, TOTAL (25OH): Lab: 18.7 — ABNORMAL LOW

## 2019-03-27 NOTE — Unmapped (Signed)
For what reason? I don't see anything on his problem list or in his last clinic note I can use as a diagnosis for the referral.

## 2019-03-27 NOTE — Unmapped (Signed)
Message reviewed and placed in his chart.

## 2019-03-27 NOTE — Unmapped (Signed)
Message from patient reviewed and placed as progress note in his chart. See below.   I also needed to let you know that Dr. Elder Cyphers with [facility] prescribed me Percocet because I had surgery with her and I forgot to let you know!!  I know I'm on contract with you so Just wanted to drop you a note to let you know!!!

## 2019-03-27 NOTE — Unmapped (Signed)
Final Diagnosis       Date                     Value               Ref Range           Status                03/24/2019                                                       Final             Right inguinal fold, excision -Dense dermal inflammation and fibrosis with epidermal hyperplasia  consistent with hidradenitis in the correct context. [FORMATTING REMOVED]  ----------      Informed patient of benign result via MyChart message    Tracking: No

## 2019-03-28 MED ORDER — FLUTICASONE PROPIONATE 50 MCG/ACTUATION NASAL SPRAY,SUSPENSION: 1 | Bottle | Freq: Two times a day (BID) | 3 refills | 0 days | Status: AC

## 2019-03-29 DIAGNOSIS — G4733 Obstructive sleep apnea (adult) (pediatric): Principal | ICD-10-CM

## 2019-03-29 MED ORDER — FLUTICASONE PROPIONATE 50 MCG/ACTUATION NASAL SPRAY,SUSPENSION: 1 | Bottle | Freq: Two times a day (BID) | 3 refills | 0 days | Status: AC

## 2019-03-29 MED FILL — HUMIRA PEN CITRATE FREE 40 MG/0.4 ML: 28 days supply | Qty: 8 | Fill #0

## 2019-03-29 MED FILL — HUMIRA PEN CITRATE FREE 40 MG/0.4 ML: 28 days supply | Qty: 8 | Fill #0 | Status: AC

## 2019-03-30 ENCOUNTER — Ambulatory Visit: Admit: 2019-03-30 | Discharge: 2019-03-30 | Disposition: A | Discharge status: Home / Self Care | Payer: MEDICAID

## 2019-03-30 DIAGNOSIS — G8918 Other acute postprocedural pain: Principal | ICD-10-CM

## 2019-03-30 DIAGNOSIS — L732 Hidradenitis suppurativa: Principal | ICD-10-CM

## 2019-03-30 NOTE — Unmapped (Signed)
Pt arrives with c/o increased pain after skin removal 2/2 HS.  Pt denies s/s of infection.  Pt states he is taking Keflex and Percocet but is out of percocet until tomorrow morning.

## 2019-03-30 NOTE — Unmapped (Signed)
CPAP CMN signed and faxed to [facility] 10-20.  Called patient to let him know.    Conf: Message Succeeded: [id number] .[email or web address]') on 03/30/2019 at 1:58:57 PM

## 2019-03-30 NOTE — Unmapped (Signed)
Returned after hours page to patient regarding worsening pain of R infrapannus/inguinal region after recent HS unroofing. Patient has previously required additional course of Percocet for pain. He has been applying ice to area and taking ibuprofen for pain. Reports new onset radiation of pain to the abdomen. He has been applying ice and vinegar soaks to area as previously instructed.     He denies erythema of the area but does report bruising, bleeding and clear discharge. He denies redness or swelling of surrounding area. Denies fevers, chills or other systemic symptoms    Discussed with patient that based on the provided history, the wound is likely not infected and that pain is likely secondary to normal post-operative inflammation. However, given patient's previous extended course of pain medications and failure to respond to other therapies, I recommended an in person evaluation to rule infection out. Patient declines urgent care or in-person office visit at this time, prefers to observe and will contact clinic should his condition worsen.

## 2019-03-30 NOTE — Unmapped (Signed)
[facility] Emergency Department Provider Note    ED Clinical Impression     Final diagnoses:   Hidradenitis suppurativa (Primary)   Postoperative pain         Initial Impression, ED Course, Assessment and Plan     This is a 38 year old male with a history of morbid obesity, diabetes, hypertension, liver disease, hidradenitis suppurativa, status post I&D who presents with persistent postprocedural pain.  No fever or other infectious symptoms.  Vital signs significant for hypertension, however likely due to poorly fitting cuff.  On physical exam, he has several lesions in the right groin status post I&D.  There is no active drainage, erythema, warmth, crepitus.  Exam is consistent with well-healing postprocedural lesions.  No evidence of Fournier's gangrene on exam.  Glucose reassuring.  Plan for pain control with lidocaine ointment and Percocet.  Patient has a prescription for Percocet already called into the pharmacy, so he will not need any additional prescriptions on discharge.  Recommended follow-up with dermatology.  Patient has a ride home.      I reviewed the patient's prior medical records (dermatology).   I obtained additional history from his wife.       ____________________________________________    Time seen: March 30, 2019 4:36 AM       History     Chief Complaint  Post-op Problem      HPI Haidar Muse is a 38 y.o. male with a history of morbid obesity, diabetes, hypertension, liver disease, hidradenitis suppurativa status post I&D who presents with persistent postprocedural pain.  Per the patient, he had several lesions in his right groin incised and drained.  He has been using occasional Percocet and has tried vinegar soaks, ice, and OTC meds without improvement.  He has run out of his Percocet and has a prescription waiting for him at the pharmacy but has not been able to pick up yet.  He denies any fever, nausea, vomiting, purulent drainage from the wounds.  His glucose was elevated into the mid 300s this evening but he corrected it with multiple medications.    Past Medical History:   Diagnosis Date   ??? Acne    ??? Allergic    ??? Anxiety    ??? Depression    ??? Diabetes mellitus (CMS-HCC)    ??? GERD (gastroesophageal reflux disease)    ??? Gout    ??? Hypertension    ??? IBS (irritable bowel syndrome)    ??? 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 Liver disease    ??? Migraines    ??? Morbid obesity with BMI of 60.0-69.9, adult (CMS-HCC)    ??? 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 Obstructive sleep apnea    ??? OSA on CPAP    ??? Severe obstructive sleep apnea    ??? Trapezius muscle strain 12/07/2013   ??? Urinary incontinence, nocturnal enuresis    ??? Venous insufficiency        Past Surgical History:   Procedure Laterality Date   ??? EYE SURGERY  11/15   ??? PR COLONOSCOPY FLX DX W/COLLJ SPEC WHEN PFRMD N/A 03/24/2013    Procedure: COLONOSCOPY, FLEXIBLE, PROXIMAL TO SPLENIC FLEXURE; DIAGNOSTIC, W/WO COLLECTION SPECIMEN BY BRUSH OR WASH;  Surgeon: Clint Bolder, MD;  [facility];  Service: Gastroenterology   ??? PR EYE SURG POST SGMT PROC UNLISTED Left     pneumatic retinopexy OS   ??? PR UPPER GI ENDOSCOPY,DIAGNOSIS N/A 02/02/2013 Procedure: UGI  ENDO, INCLUDE ESOPHAGUS, STOMACH, & DUODENUM &/OR JEJUNUM; DX W/WO COLLECTION SPECIMN, BY BRUSH OR WASH;  Surgeon: Malcolm Metro, MD;  Location: [facility];  Service: Gastroenterology   ??? [place] PYLORIC STENOSIS ([place] HISTORICAL RESULT)           Current Facility-Administered Medications:   ???  lidocaine (XYLOCAINE) 5 % ointment, , Topical, BID, Steffany Schoenfelder Burman Riis, MD  ???  oxyCODONE (ROXICODONE) immediate release tablet 10 mg, 10 mg, Oral, Once, Kortney Potvin Burman Riis, MD    Current Outpatient Medications:   ???  acetaminophen (TYLENOL) 500 MG tablet, Take 2 tablets (1,000 mg total) by mouth Three (3) times a day., Disp: 30 tablet, Rfl: 0  ???  ADVAIR DISKUS 500-50 mcg/dose diskus, Inhale 1 puff 2 (two) times a day., Disp: 13 Inhaler, Rfl: 1  ???  albuterol 2.5 mg /3 mL (0.083 %) nebulizer solution, Inhale 3 mL (2.5 mg total) by nebulization every four (4) hours as needed for wheezing. (Patient not taking: Reported on 03/24/2019), Disp: 25 vial, Rfl: 2  ???  albuterol HFA 90 mcg/actuation inhaler, Inhale 2 puffs every six (6) hours as needed for wheezing. (Patient not taking: Reported on 03/24/2019), Disp: 3 Inhaler, Rfl: 1  ???  ALPRAZolam (XANAX) 0.5 MG tablet, Take 1 tablet (0.5 mg total) by mouth Three (3) times a day. (Patient taking differently: Take 1 mg by mouth Three (3) times a day. Usually takes nightly for anxiety), Disp: 60 tablet, Rfl: 0  ???  blood sugar diagnostic Strp, by Other route Three (3) times a day before meals., Disp: 300 each, Rfl: 1  ???  blood-glucose meter kit, Use as directed, Disp: 1 each, Rfl: 0  ???  cetirizine (ZYRTEC) 10 MG tablet, Take 1 tablet (10 mg total) by mouth nightly as needed for allergies., Disp: 90 tablet, Rfl: 1  ???  clotrimazole-betamethasone (LOTRISONE) 1-0.05 % cream, Apply topically Two (2) times a day., Disp: 60 g, Rfl: 1 ???  cyclobenzaprine (FLEXERIL) 10 MG tablet, Take 1 tablet (10 mg total) by mouth Three (3) times a day as needed for muscle spasms., Disp: 60 tablet, Rfl: 0  ???  diclofenac (VOLTAREN) 50 MG EC tablet, Take 1 tablet (50 mg total) by mouth two (2) times a day as needed., Disp: 60 each, Rfl: 2  ???  dicyclomine (BENTYL) 10 mg capsule, Take 1 capsule (10 mg total) by mouth Three (3) times a day., Disp: 270 capsule, Rfl: 1  ???  diphenoxylate-atropine (LOMOTIL) 2.5-0.025 mg per tablet, Take 1 tablet by mouth two (2) times a day as needed for diarrhea., Disp: 30 tablet, Rfl: 5  ???  empty container (SHARPS CONTAINER) Misc, Use as directed to dispose of Humira needles, Disp: 1 each, Rfl: 2  ???  empty container Misc, Use as directed to dispose of Humira injections, Disp: 1 each, Rfl: 2  ???  erenumab-aooe (AIMOVIG AUTOINJECTOR) 70 mg/mL AtIn, Inject 1 mL under the skin every twenty-eight (28) days., Disp: 3 Syringe, Rfl: 1  ???  ergocalciferol (DRISDOL) 1,250 mcg (50,000 unit) capsule, Take 1 capsule (50,000 Units total) by mouth once a week., Disp: 4 capsule, Rfl: 2  ???  famotidine (PEPCID) 20 MG tablet, Take 1 tablet (20 mg total) by mouth Two (2) times a day., Disp: 180 tablet, Rfl: 1  ???  fluticasone propionate (FLONASE) 50 mcg/actuation nasal spray, 1 spray by Each Nare route Two (2) times a day., Disp: 3 Bottle, Rfl: 3  ???  glipiZIDE (GLUCOTROL) 10 MG tablet, Take  2 tablets (20 mg total) by mouth Two (2) times a day (30 minutes before a meal)., Disp: 360 tablet, Rfl: 1  ???  HUMIRA PEN CITRATE FREE 40 MG/0.4 ML, Inject the contents of 2 pens (80mg  total) under the skin every seven (7) days., Disp: 8 each, Rfl: 3  ???  hydrocortisone (PROCTOSOL HC) 2.5 % rectal cream, Insert 1 application into the rectum two (2) times a day as needed., Disp: 28.35 g, Rfl: 2  ???  ibuprofen (ADVIL,MOTRIN) 800 MG tablet, Take 1 tablet (800 mg total) by mouth every eight (8) hours as needed., Disp: 90 tablet, Rfl: 1 ???  insulin glargine (LANTUS U-100 INSULIN) 100 unit/mL injection, Inject 0.8 mL (80 Units total) under the skin nightly., Disp: 72 mL, Rfl: 1  ???  insulin regular (HUMULIN R REGULAR U-100 INSULN) 100 unit/mL injection, SLIDING SCALE BLOOD SUGAR 0-150=0 UNITS,150-200=3 UNITS, 200-250=6 UNITS,250-300=9 UNITS, 300-350=12 UNITS, Disp: 40 mL, Rfl: 1  ???  insulin syringe-needle U-100 (BD INSULIN SYRINGE ULTRA-FINE) 1 mL 31 gauge x 5/16 (8 mm) Syrg, USE AS DIRECTED., Disp: 100 each, Rfl: 1  ???  ketoconazole (NIZORAL) 2 % cream, Apply 1 application topically daily., Disp: 15 g, Rfl: 11  ???  lancets Misc, 1 each by Miscellaneous route Three (3) times a day before meals., Disp: 300 each, Rfl: 1  ???  levothyroxine (SYNTHROID) 50 MCG tablet, Take 1 tablet (50 mcg total) by mouth daily., Disp: 90 tablet, Rfl: 1  ???  miscellaneous medical supply Misc, Gauze pads 4 x 4 inches and paper tape, Disp: 30 each, Rfl: 0  ???  MITIGARE 0.6 mg cap capsule, Take 1.2 mg on Day 1 of flare, followed by 0.6 mg one hour later. Day 2 take 0.6 mg BID until flare resolves., Disp: 60 capsule, Rfl: 3  ???  montelukast (SINGULAIR) 10 mg tablet, Take 1 tablet (10 mg total) by mouth daily., Disp: 90 each, Rfl: 1  ???  oxybutynin (DITROPAN) 5 MG tablet, Take 1 tablet (5 mg total) by mouth Two (2) times a day., Disp: 180 tablet, Rfl: 1  ???  oxyCODONE-acetaminophen (PERCOCET) 5-325 mg per tablet, Take 1 tablet by mouth every six (6) hours as needed for pain., Disp: 8 tablet, Rfl: 0  ???  PROAIR HFA 90 mcg/actuation inhaler, Inhale 2 puffs every four (4) hours as needed for wheezing., Disp: 3 Inhaler, Rfl: 1  ???  promethazine (PHENERGAN) 25 MG tablet, Take 1-2 tablets (25-50 mg total) by mouth nightly as needed for nausea., Disp: 30 each, Rfl: 2  ???  propranoloL (INDERAL LA) 120 mg 24 hr capsule, Take 1 capsule (120 mg total) by mouth daily., Disp: 90 capsule, Rfl: 1 ???  topiramate (TOPAMAX) 25 MG tablet, Take 1 tablet (25 mg total) by mouth Two (2) times a day., Disp: 180 tablet, Rfl: 1  ???  traZODone (DESYREL) 100 MG tablet, take 2 tablets by mouth at bedtime, Disp: , Rfl: 0    Allergies  Erythromycin, Penicillins, Sulfa (sulfonamide antibiotics), Other, Erythromycin base, and Sulfasalazine    Family History   Problem Relation Age of Onset   ??? Cancer Maternal Grandfather    ??? Hearing loss Maternal Grandfather    ??? Cancer Paternal Grandfather    ??? COPD Paternal Grandmother    ??? Arthritis Paternal Grandmother    ??? Depression Paternal Grandmother    ??? Diabetes Mother    ??? Heart disease Mother    ??? Migraines Mother    ??? Arthritis Mother    ???  Depression Mother    ??? GER disease Mother    ??? Hypertension Mother    ??? Angina Mother    ??? COPD Mother    ??? Glaucoma Mother    ??? Diabetes Sister    ??? Migraines Sister    ??? Asthma Sister    ??? Depression Sister    ??? Diabetes Brother    ??? Asthma Brother    ??? Diabetes Maternal Grandmother    ??? Heart disease Maternal Grandmother    ??? Migraines Maternal Grandmother    ??? Depression Maternal Grandmother    ??? Angina Maternal Grandmother    ??? Hypertension Maternal Grandmother    ??? Diabetes Maternal Uncle    ??? Diabetes Maternal Uncle    ??? Liver disease Maternal Uncle    ??? Kidney disease Maternal Uncle    ??? Asthma Brother    ??? Colorectal Cancer Neg Hx    ??? Esophageal cancer Neg Hx    ??? Liver cancer Neg Hx    ??? Pancreatic cancer Neg Hx    ??? Stomach cancer Neg Hx    ??? Glaucoma Neg Hx    ??? Amblyopia Neg Hx    ??? Blindness Neg Hx    ??? Retinal detachment Neg Hx    ??? Strabismus Neg Hx    ??? Macular degeneration Neg Hx    ??? Anesthesia problems Neg Hx    ??? Broken bones Neg Hx    ??? Clotting disorder Neg Hx    ??? Collagen disease Neg Hx    ??? Dislocations Neg Hx    ??? Fibromyalgia Neg Hx    ??? Gout Neg Hx    ??? Hemophilia Neg Hx    ??? Osteoporosis Neg Hx    ??? Rheumatologic disease Neg Hx    ??? Scoliosis Neg Hx    ??? Severe sprains Neg Hx    ??? Sickle cell anemia Neg Hx ??? Spinal Compression Fracture Neg Hx    ??? Melanoma Neg Hx    ??? Basal cell carcinoma Neg Hx    ??? Squamous cell carcinoma Neg Hx    ??? Deep vein thrombosis Neg Hx        Social History  Social History     Tobacco Use   ??? Smoking status: Never Smoker   ??? Smokeless tobacco: Never Used   Substance Use Topics   ??? Alcohol use: Never     Alcohol/week: 0.0 standard drinks     Comment: rare social   ??? Drug use: Never       Review of Systems  Constitutional: Negative for fever.  Cardiovascular: Negative for chest pain.  Respiratory: Negative for shortness of breath.  Gastrointestinal: Negative for abdominal pain, vomiting or diarrhea.  Genitourinary: Negative for dysuria or hematuria.  Musculoskeletal: Positive for right groin pain.  Negative for back pain.  Skin: Positive for rash.  Remainder of 10 point review of systems is negative.    Physical Exam     VITAL SIGNS:    ED Triage Vitals [03/30/19 0412]   Enc Vitals Group      BP 162/113      Heart Rate 87      SpO2 Pulse       Resp 20      Temp 36.5 ??C (97.7 ??F)      Temp Source Oral      SpO2 95 %       Constitutional: Alert and oriented. Well appearing and in no  distress.  Eyes: Conjunctivae are normal.  ENT       Head: Normocephalic and atraumatic.       Nose: No congestion.       Mouth/Throat: Mucous membranes are moist.       Neck: No stridor, neck is supple.  Hematological/Lymphatic/Immunilogical: No cervical lymphadenopathy.  Cardiovascular: Normal rate, regular rhythm. No m/g/r. Normal and symmetric distal pulses are present in all extremities.   Respiratory: Normal respiratory effort. Breath sounds are normal. No wheezes, rales, rhonchi.  Gastrointestinal: Soft and nontender. There is no CVA tenderness.  Genitourinary: Chaperone present for exam.  He has multiple healing lesions status post I&D of the right groin.  There is no surrounding erythema, warmth or crepitus.  There is no purulent drainage. Musculoskeletal: Nontender with normal range of motion in all extremities.       Right lower leg: No tenderness or edema.       Left lower leg: No tenderness or edema.  Neurologic: Normal speech and language. No gross focal neurologic deficits are appreciated.   Skin: Skin is warm, dry and intact. No rash noted.  Psychiatric: Mood and affect are normal. Speech and behavior are normal.    Pertinent labs & imaging results that were available during my care of the patient were reviewed by me and considered in my medical decision making (see chart for details).     Jamas Lav, MD  03/30/19 [phone]

## 2019-03-31 DIAGNOSIS — J302 Other seasonal allergic rhinitis: Principal | ICD-10-CM

## 2019-03-31 NOTE — Unmapped (Addendum)
Pt calling to get referral to ENT. He states he used to see ENT and needs to go back for follow-up. He said the referral needs to go to [facility]. See ENT note July 2019.

## 2019-04-01 NOTE — Unmapped (Signed)
Pt.notified

## 2019-04-02 ENCOUNTER — Ambulatory Visit: Admit: 2019-04-02 | Discharge: 2019-04-03 | Payer: MEDICAID | Attending: Dermatology | Primary: Dermatology

## 2019-04-02 MED ORDER — CLINDAMYCIN 1 % LOTION: mL | 5 refills | 0 days | Status: AC

## 2019-04-02 MED ORDER — CLOBETASOL 0.05 % TOPICAL OINTMENT: g | 1 refills | 0 days | Status: AC

## 2019-04-02 MED ORDER — CEPHALEXIN 500 MG CAPSULE: 500 mg | capsule | Freq: Four times a day (QID) | 0 refills | 30 days | Status: AC

## 2019-04-02 NOTE — Unmapped (Signed)
Assessment and Plan:    Hidradenitis suppurativa, Hurley stage II, currently flaring:  - Now s/p unroofing procedure in the right groin with new draining inflammatory nodule superior to the site of the unroofing in the right groin  - Continue humira 80mg  weekly  - Increase cephalexin dose to 500mg  four times daily  - Start clindamycin lotion topically nightly to the flaring lesions  - Start clobetasol ointment daily on the flaring lesions; discussed appropriate use of steroids and to not use unless the lesions are inflamed, as this can lead to atrophy of the skin    RTC: Already scheduled appointment with Dr. Elder Cyphers on 04/14/2019    Chief Complaint: Follow-up hidradenitis suppurativa    HPI: This is a pleasant 38 y.o. male last seen for an HS unroofing procedure with Dr. Elder Cyphers 03/24/2019 for an HS unroofing procedure on his right groin and returns today for a wound check.    He reports that yesterday he started noticing that he had bleeding from the area of his unroofing, and that he continued to have bleeding this morning. He states that he has remained in pain since the procedure, and that he has used topical lidocaine gel twice daily which does give relief for a short period of time, but it is not sustained. He is concerned that he is still having bleeding over a week out from his procedure.    No other concerns at this time.    Past Medical History:   - No history of skin cancer  - Insulin dependent diabetes mellitus    Family History:  No history of melanoma    Social History:  Lives in [place], [place]    Review of Systems: No fevers or chills. No other skin concerns unless otherwise noted in the HPI.    Physical Examination:  General: Well-developed, well-nourished male in no acute distress, resting comfortably.  Neuro: A, A, Ox 3. Answers questions appropriately.  Skin: Per patient request, focused exam of abdomen and groin performed and notable for: - well healing dermis with some granulation tissue at the site of the unroofing procedure on the right groin  - draining inflammatory nodule superior to the site of the unroofing procedure in the right groin  - soft, reducible papule superior to the site of the unroofing procedure   -All other areas examined were normal or had no significant findings  ______________________________________________________________________    The patient was seen and examined by Lorayne Bender, MD who agrees with the assessment and plan as above.

## 2019-04-02 NOTE — Unmapped (Signed)
Increase the cephalexin to 500mg  four times a day  Apply clobetasol ointment once daily to the painful bumps if needed for flares  Apply clindamycin lotion nightly to the painful bumps if needed for flares

## 2019-04-04 NOTE — Unmapped (Signed)
Received an after hours call on     April 04, 2019 12:34 AM      Patient had HS surgery in the groin area with Dr. Elder Cyphers on 10/14. The area has been healing well with spots of blood here and there. However, tonight his wife noticed a pool of blood where he was sitting after he had gotten up. No coinciding drainage - only noted blood. No increased pain. Discussed with patient that it is unusual to have things healing and then acutely develop new onset of sudden bleeding in the area. Advised patient to hold pressure. However, if bleeding remains persistent and out of control, then would recommend going to the ED. If he is able to get the bleeding under control over the weekend, then he can follow up with [place] outpatient as an urgent visit (preferably with Dr. Elder Cyphers as she had done the surgery, but he was also seen by Dr Izora Ribas for post-op wound check). Patient voiced understanding and in agreement with the plan.

## 2019-04-05 DIAGNOSIS — G4733 Obstructive sleep apnea (adult) (pediatric): Principal | ICD-10-CM

## 2019-04-05 DIAGNOSIS — L732 Hidradenitis suppurativa: Principal | ICD-10-CM

## 2019-04-05 MED ORDER — CLINDAMYCIN PHOSPHATE 1 % TOPICAL SOLUTION
Freq: Two times a day (BID) | TOPICAL | 4 refills | 0.00000 days | Status: CP
Start: 2019-04-05 — End: 2020-04-04

## 2019-04-13 NOTE — Unmapped (Signed)
Called attempting to complete pre-rooming. [id number]

## 2019-04-14 ENCOUNTER — Ambulatory Visit: Admit: 2019-04-14 | Discharge: 2019-04-15 | Payer: MEDICAID | Attending: Dermatology | Primary: Dermatology

## 2019-04-14 DIAGNOSIS — L732 Hidradenitis suppurativa: Principal | ICD-10-CM

## 2019-04-14 DIAGNOSIS — J302 Other seasonal allergic rhinitis: Principal | ICD-10-CM

## 2019-04-14 NOTE — Unmapped (Signed)
Patient Education        triamcinolone (injection)  Pronunciation:  TRYE am SIN oh lone  Brand:  Kenalog-40, Zilretta  What is the most important information I should know about triamcinolone injection?  You may not be able to receive this medicine if you have a fungal infection, or a condition called idiopathic thrombocytopenic purpura (ITP).  What is triamcinolone injection?  Triamcinolone is a steroid that prevents the release of substances in the body that cause inflammation.  Triamcinolone injection is used to treat many different types of inflammatory conditions, including severe allergic reactions, skin disorders, severe colitis, inflammation of the joints or tendons, blood cell disorders, inflammatory eye disorders, lung disorders, and problems caused by low adrenal gland hormones.  Triamcinolone is also used to treat certain skin disorders caused by autoimmune conditions such as lupus, psoriasis, lichen planus, and others.  Different brands of triamcinolone injection have different uses.   Triamcinolone injection may also be used for purposes not listed in this medication guide.  What should I discuss with my healthcare provider before receiving triamcinolone injection?  You should not be treated with triamcinolone if you are allergic to it.  You may not be able to receive triamcinolone injection if you have a fungal infection, or a condition called idiopathic thrombocytopenic purpura (ITP).  Tell your doctor if you have ever had:  ?? an active or chronic infection, including tuberculosis;  ?? idiopathic thrombocytopenic purpura (ITP);  ?? high blood pressure, heart problems;  ?? cataracts, glaucoma, or herpes infection of the eyes;  ?? a parasite infection that causes diarrhea (such as threadworms);  ?? a nerve-muscle disorder, such as myasthenia gravis;  ?? a stomach ulcer, diverticulitis, ulcerative colitis;  ?? a colostomy or ileostomy, or stomach surgery;  ?? low bone mineral density; or ?? a problem with your thyroid or adrenal gland.  Tell your doctor if you are pregnant or breastfeeding.  How is triamcinolone injection given?  Triamcinolone injection is given through a needle and can be injected into different areas of the body: into a muscle, into the space around a joint or tendon, or into a lesion on the skin. A healthcare provider will give you this injection.  Not every brand of this medicine is used for the same conditions or injected into the same body areas. Some brands are given only one time as needed. Others may be given at regular intervals. Carefully follow your doctor's dosing instructions.  Triamcinolone can weaken (suppress) your immune system, and you may get an infection more easily. Call your doctor if you have unusual bruising or bleeding, or signs of infection (fever, weakness, cold or flu symptoms, skin sores, diarrhea, frequent or recurring illness).  Long-term use of steroids can cause harmful effects on the eyes. If you receive triamcinolone injection for longer than 6 weeks, your doctor may want you to have regular eye exams.  Your doctor may instruct you to limit your salt intake while you are receiving triamcinolone injection. You may also need to take potassium supplements. Follow all instructions.  This medicine can affect the results of certain medical tests. Tell any doctor who treats you that you are using triamcinolone.  You should not stop using triamcinolone suddenly after long-term repeated use, or you could have unpleasant withdrawal symptoms. Ask your doctor how to safely stop using this medicine.  What happens if I miss a dose?  Call your doctor for instructions if you miss an appointment for a scheduled triamcinolone injection.  When  triamcinolone is used as a single dose, you will not be on a regular dosing schedule.  What happens if I overdose? Since this medicine is given by a healthcare professional in a medical setting, an overdose is unlikely to occur.  Using too much triamcinolone is not likely to cause serious problems. However, long term use of high doses can lead to thinning skin, easy bruising, changes in body fat (especially in your face, neck, back, and waist), increased acne or facial hair, menstrual problems, impotence, or loss of interest in sex.  What should I avoid while receiving triamcinolone injection?  After injection of triamcinolone into a joint, avoid overusing that joint through strenuous activity or high-impact sports. You could cause damage to the joint.  Avoid being near people who are sick or have infections. Call your doctor for preventive treatment if you are exposed to chickenpox or measles. These conditions can be serious or even fatal in people who are using triamcinolone.  Do not receive a live vaccine or a toxoid vaccine while using triamcinolone, or you could develop a serious infection. Live vaccines include measles, mumps, rubella (MMR), polio, rotavirus, typhoid, yellow fever, varicella (chickenpox), zoster (shingles), and nasal flu (influenza) vaccine. Toxoid vaccines include diphtheria-tetanus toxoid (DT or Td).  What are the possible side effects of triamcinolone injection?  Get emergency medical help if you have signs of an allergic reaction: hives; difficult breathing; swelling of your face, lips, tongue, or throat.  Call your doctor at once if you have:  ?? (after injection into a joint space) increased pain or swelling, joint stiffness, fever, and general ill feeling;  ?? blurred vision, tunnel vision, eye pain, or seeing halos around lights;  ?? unusual changes in mood or behavior;  ?? swelling, rapid weight gain, feeling short of breath;  ?? stomach cramps, vomiting, diarrhea, bloody or tarry stools, rectal irritation;  ?? sudden numbness or weakness (especially on one side of the body); ?? a seizure (convulsions);  ?? severe headache, blurred vision, pounding in your neck or ears;  ?? increased pressure inside the skull --severe headaches, ringing in your ears, dizziness, nausea, vision problems, pain behind your eyes; or  ?? signs of low adrenal gland hormones --flu-like symptoms, headache, depression, weakness, tiredness, diarrhea, vomiting, stomach pain, craving salty foods, and feeling light-headed.  Certain side effects may be more likely with long-term use or repeated doses of triamcinolone injection.  Steroids can affect growth in children. Tell your doctor if your child is not growing at a normal rate while using this medicine.  Common side effects may include:  ?? skin changes (acne, dryness, redness, bruising, discoloration);  ?? increased hair growth, or thinning hair;  ?? nausea, bloating, appetite changes;  ?? stomach or side pain;  ?? cough, runny or stuffy nose;  ?? headache, sleep problems (insomnia);  ?? a wound that is slow to heal;  ?? sweating more than usual; or  ?? changes in your menstrual periods.  This is not a complete list of side effects and others may occur. Call your doctor for medical advice about side effects. You may report side effects to FDA at 1-800-FDA-1088.  What other drugs will affect triamcinolone injection?  Sometimes it is not safe to use certain medications at the same time. Some drugs can affect your blood levels of other drugs you take, which may increase side effects or make the medications less effective.  Tell your doctor about all your current medicines. Many drugs can affect triamcinolone, especially:  ??  an antibiotic or antifungal medication;  ?? birth control pills or hormone replacement therapy;  ?? a blood thinner (warfarin, Coumadin, and others);  ?? a diuretic or water pill;  ?? insulin or oral diabetes medicine;  ?? medicine to treat tuberculosis; ?? a nonsteroidal anti-inflammatory drug or NSAID (aspirin, ibuprofen, naproxen, diclofenac, indomethacin, Advil, Aleve, Celebrex, and many others); or  ?? seizure medication.  This list is not complete and many other drugs may affect triamcinolone. This includes prescription and over-the-counter medicines, vitamins, and herbal products. Not all possible drug interactions are listed here.  Where can I get more information?  Your doctor or pharmacist can provide more information about triamcinolone injection.  Remember, keep this and all other medicines out of the reach of children, never share your medicines with others, and use this medication only for the indication prescribed. Every effort has been made to ensure that the information provided by [employer]') is accurate, up-to-date, and complete, but no guarantee is made to that effect. Drug information contained herein may be time sensitive. [employer] information has been compiled for use by healthcare practitioners and consumers in the [place] and therefore [employer] does not warrant that uses outside of the [place] are appropriate, unless specifically indicated otherwise. [employer]'s drug information does not endorse drugs, diagnose patients or recommend therapy. [employer]'s drug information is an [employer] corporate to assist licensed healthcare practitioners in caring for their patients and/or to serve consumers viewing this service as a supplement to, and not a substitute for, the expertise, skill, knowledge and judgment of healthcare practitioners. The absence of a warning for a given drug or drug combination in no way should be construed to indicate that the drug or drug combination is safe, effective or appropriate for any given patient. [employer] does not assume any responsibility for any aspect of healthcare administered with the aid of information [employer] provides. The information contained herein is not intended to cover all possible uses, directions, precautions, warnings, drug interactions, allergic reactions, or adverse effects. If you have questions about the drugs you are taking, check with your doctor, nurse or pharmacist.  Copyright [phone] [employer] Version: 4.06. Revision date: 06/08/2018.  Care instructions adapted under license by [facility]. If you have questions about a medical condition or this instruction, always ask your healthcare professional. [employer] disclaims any warranty or liability for your use of this information.       Patient Education        Learning About Hidradenitis Suppurativa  What is hidradenitis suppurativa? Hidradenitis suppurativa (say hih-drad-uh-NY-tus sup-yur-uh-TY-vuh) is a skin condition that causes lumps on the skin. The lumps look like pimples or boils. The condition can come and go for many years.  Doctors don't know exactly how this condition starts. But they do know that something irritates and inflames the hair follicles, causing them to swell and form lumps. This skin condition can't be spread from person to person (isn't contagious).  What are the symptoms?  Red lumps that may look like pimples, acne, or boils appear on the skin and are usually painful. The lumps:  ?? Usually occur in areas where skin rubs against skin, such as in the armpit. They can also appear under the breasts, in the groin area, on the buttocks, around the anus, and on the inner thighs.  ?? May go away on their own in a few weeks. But they often come back in the same area.  ?? Can become infected and  break open, draining blood and pus that usually smells bad.  If the condition isn't treated and gets worse, hollow tunnels can form under the skin. Over time, the infection and tunnels will heal, but a thick scar may form. These scars can keep skin from stretching naturally.  How is hidradenitis suppurativa treated?  The treatment depends on how serious the condition is. Your doctor may discuss:  ?? Medicines. You may need to take pills, such as antibiotics, or rub a prescription ointment or cream on the affected skin.  ?? Corticosteroid injections (shots). You may get these shots into the affected areas.  ?? Hormone pills. Some women are helped by taking birth control pills or other medicines that affect their hormones.  ?? Removing infected tissue.  Home treatment  Home treatment may provide pain relief and help prevent nodules from coming back. Home treatment includes:  ?? Wearing loose-fitting clothes.  ?? Washing the area gently with mild soap.  ?? Staying at a healthy weight. If you are overweight, losing weight may help the condition. ?? Quitting smoking, if you smoke.  Follow-up care is a key part of your treatment and safety. Be sure to make and go to all appointments, and call your doctor if you are having problems. It's also a good idea to know your test results and keep a list of the medicines you take.  Where can you learn more?  Go to [facility] at [email or web address]  Select Patient Education under [employer]. Enter J430 in the search box to learn more about Learning About Hidradenitis Suppurativa.  Current as of: December 10, 2018??????????????????????????????Content Version: 12.6  ?? 2006-2020 [employer] Inc[employer].   Care instructions adapted under license by [facility]. If you have questions about a medical condition or this instruction, always ask your healthcare professional. [employer] disclaims any warranty or liability for your use of this information.

## 2019-04-14 NOTE — Unmapped (Signed)
Assessment and Plan:    Hidradenitis suppurativa, [place]ley stage II, currently with flare ILK today   - Now s/p unroofing procedure in the right groin but with new inflammatory nodules superior to this   - Continue humira 80mg  weekly  R/B/A reviewed including but not limited to risk infection.   - Continue keflex 500mg  QID   - Start clindamycin lotion topically nightly to the flaring lesions  - ILK today   - Consider remicade infusion given flares on 80mg  weekly of humira. R/B/A reviewed including but not limited to risk infection, infusion site reaction.     After the patient was informed of risks, benefits and side effects of intralesional steroid injection, the patient elected to undergo injection. Informed verbal consent was obtained. Risk of atrophy  and dyspigmentation with injection was explained. Kenalog 40 mg/ml was injected locally into the sites located R groin in a clean fashion following alcohol prep.   Total volume in ml=1.  Number of sites treated: 3   Wound care was explained to the patient      RTC: 2 months     Chief Complaint: Follow-up hidradenitis suppurativa    HPI: This is a pleasant 38 y.o. male last seen for an HS urgent appointment with Dr. Izora Ribas for wound check. Wound appeared to be healing well.     Today he notes:   - continues on humira 80mg  weekly   - keflex 500mg  QID   - still having flare in R groin   - pain and drainage       Past Medical History:   - No history of skin cancer  - Insulin dependent diabetes mellitus    Family History:  No history of melanoma    Social History:  Lives in [place], [place]    Review of Systems: No fevers or chills or joint aches. No other skin concerns unless otherwise noted in the HPI.    Physical Examination:  General: Well-developed, well-nourished male in no acute distress, resting comfortably.  Neuro: A, A, Ox 3. Answers questions appropriately.  Skin: Per patient request, focused exam of abdomen and groin performed and notable for: - well healing scar at unroofing site on R groin   - draining inflammatory nodules in R groin superior to surgical site   -All other areas examined were normal or had no significant findings  ______________________________________________________________________

## 2019-04-19 ENCOUNTER — Ambulatory Visit: Payer: Medicaid Other | Admitting: Podiatry

## 2019-04-19 ENCOUNTER — Institutional Professional Consult (permissible substitution): Admit: 2019-04-19 | Discharge: 2019-04-19 | Payer: MEDICAID

## 2019-04-19 ENCOUNTER — Ambulatory Visit
Admit: 2019-04-19 | Discharge: 2019-04-19 | Payer: MEDICAID | Attending: Critical Care Medicine | Primary: Critical Care Medicine

## 2019-04-19 NOTE — Unmapped (Signed)
04/19/19    Travel Screening Questions Completed.    Travel Screening Questions/Answers:  1). Have you traveled out of [place] within the last 14 days?: No  2). Do you have any of the following symptoms that are new or worsening: cough, shortness of breath, loss of taste/smell, sore throat, fever or feeling feverish, repeated shaking chills, muscle pain, vomiting, or diarrhea?: Yes  3). Have you had close contact with a person with confirmed COVID-19 in the last 21 days before symptoms began?: No  4). Have you tested positive in the last 21 days for COVID-19?: No      Positive Travel Screen: Patient answered YES to Question 2, 3, or 4.  Patient informed of the following: Thank you for your call. Based on current recommendations, I would like you to be evaluated by a provider virtually. I will transfer your call to the Virtual Schedulers who can assist you with getting an appointment with the provider on call.       The call was handled in the following manner: Scheduled for Telephone Visit

## 2019-04-19 NOTE — Unmapped (Signed)
Assessment/ Plan     Logan Quinn is a 38 y.o. male presenting to [facility] for evaluation of 2 days of runny nose, non-productive cough with wheezing, fatigue and subjective fevers. He denies known exposures. His presentation is more consistent with viral illness so we'll hold off on antibiotic for now and he was counseled at length on symptomatic management of viral syndrome. Lung exam demonstrates faint wheeze in left fields, he is oxygenating appropriately and there is no conversational dyspnea. Felt that chest xray was not indicated today. We did reveal low threshold to seek emergent care, particularly for shortness of breath or chest pressure, given his underlying co-morbidities.    Any labs and radiology results that were available during my care of the patient were independently reviewed by me and considered in my medical decision making.    Diagnoses and all orders for this visit:    Contact with and (suspected) exposure to other viral communicable diseases  -     COVID-19 PCR; Future  -     COVID-19 PCR    IMPRESSION/PLAN:  - COVID result pending - counseled on viral symptom management  - hold off on antibiotic prescription at this time  - continue albuterol inhaler and neb PRN (as well as all other meds as directed)     Pt counseled on routine care for symptoms, see AVS for details.  COVID testing performed yes.  Patient directed to Home given findings during today's visit.    Subjective     HPI: Logan Quinn is a 38 y.o. male who [facility] with recent (2 days ago) onset of dry cough and wheezing, runny nose, chest tightness, headache, fatigue, and subjective fever. He slept all day yesterday and while awake has been alternating albuterol inhaler and neb treatments with some improvement in SOB. He tells me his last course of antibiotics for bronchitis may have been in 06/2018. He is regularly on Keflex for flares of Hidradentitis suppurativa. He has been adherent to Advair, Singulair, Flonase and Humira injections weekly. Last labs dated 03/24/19 reveal he is vitamin D deficient but there is no CBC or CMP to review. Last HgbA1c on 02/08/19 was 8.5% (up from 7.5 in May). We reviewed that his underlying medical conditions place him at greater risk for complications due to COVID. He was encouraged to start remediative interventions today: Tylenol for fever/aches/pains, sugar free cough suppressants, vitamin supplementation, fluids, nasal irrigation and salt water gargles. This was reviewed via MyChart as well. We will reach out with final COVID result and check in with him tomorrow.       Exposure History: In the last 14 days?     Have you traveled outside of [place]? No                City/State Country (if outside [place])                  Have you been in close contact with someone confirmed by a test to have COVID? (Close contact is within 6 feet for at least 10 minutes) No         Worked in a health care facility?   No       Do you live in a facility like a nursing home, group home, or assisted living? No   Are you scheduled to receive cancer chemotherapy within the next 7 days? No   Have you ever been  tested before for COVID-19 with a swab of your nose? Yes, When: 02/24/19   [facility]: [facility]   Are you a healthcare worker being tested to return to work? No     Symptoms:   Do you currently have any of the following that developed in the last 7 days: Subjective fever (felt feverish) Yes, how many days? 1   Chills (especially repeated shaking chills) No   Sever fatigue (felt very tired) Yes, how many days? 2   Muscle aches No   Runny nose Yes, how many days? 2   Sore Throat No   Loss of taste or smell No   Cough (new onset or worsening of chronic cough) Yes, how many days? 2   Shortness of breath Yes, how many days? 2   Nausea or vomiting No   Headache Yes, how many days? 2   Abdominal Pain No   Diarrhea (3 or more loose stools in last 24 hours) No     Social Hx:  Who is in your household?: patient, wife, son  Household members with high risk conditions?: no  Household members with COVID contact?: no    Past History/Medical Conditions:  Allergies   Allergen Reactions   ??? Erythromycin Nausea And Vomiting     Other reaction(s): Vomiting   ??? Penicillins Rash, Swelling and Hives   ??? Sulfa (Sulfonamide Antibiotics) Nausea And Vomiting and Nausea Only   ??? Other    ??? Erythromycin Base Rash   ??? Sulfasalazine Nausea And Vomiting     Active Ambulatory Problems     Diagnosis Date Noted   ??? Diabetes type 2, uncontrolled (CMS-HCC) 11/13/2012   ??? 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 Obstructive sleep apnea 07/21/2012   ??? GERD (gastroesophageal reflux disease) ([facility]-HCC) 01/01/2013   ??? Depression ([facility]-HCC) 02/09/2013   ??? Suicidal ideation 02/09/2013   ??? Morbid obesity with BMI of 50.0-59.9, adult (CMS-HCC) 03/05/2013   ??? Abdominal pain 03/05/2013   ??? Headache 07/05/2013   ??? NAFLD (nonalcoholic fatty liver disease) 62/95/2841   ??? Bleeding external hemorrhoids 09/03/2013   ??? Anxiety 09/13/2013   ??? Diarrhea 09/13/2013   ??? Obesity 09/13/2013   ??? Type 2 diabetes mellitus with hyperglycemia (CMS-HCC) 09/13/2013   ??? Left shoulder pain 10/04/2013   ??? Diabetes mellitus (CMS-HCC) 10/04/2013   ??? Trapezius muscle strain 12/07/2013   ??? 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 Obesity, morbid, BMI 50 or higher (CMS-HCC) 12/22/2014   ??? Chronic joint pain 11/16/2015 ??? Cellulitis of right lower extremity 06/19/2016   ??? No diabetic retinopathy in both eyes 10/01/2016   ??? 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 H/O retinal detachment 10/01/2016   ??? IBS (irritable bowel syndrome)    ??? Urinary incontinence, nocturnal enuresis    ??? Severe obstructive sleep apnea    ??? OSA on CPAP    ??? Hidradenitis suppurativa 04/29/2018     Resolved Ambulatory Problems     Diagnosis Date Noted   ??? Fungal dermatitis 07/05/2013   ??? Skin tag 01/17/2014   ??? Candidal balanitis 02/16/2014   ??? Tick bite 10/20/2014   ??? Skin infection 05/15/2015     Past Medical History:   Diagnosis Date   ??? Acne    ??? Allergic    ??? Gout    ??? Hypertension    ??? Liver disease    ??? Migraines    ??? Morbid obesity with BMI of 60.0-69.9, adult (CMS-HCC)    ???  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 Venous insufficiency      Do you have any of the following:   Asthma or emphysema or COPD Yes   Cystic Fibrosis No   Diabetes Yes   High Blood Pressure  No   Cardiovascular Disease No   Chronic Kidney Disease No   Chronic Liver Disease No   Chronic blood disorder like Sickle Cell Disease  No   Weak immune system due to disease or medication Yes, Which? does not state   Neurologic condition that limits movement  No   Developmental delay - Moderate to Severe  No   Recent (within past 2 weeks) or current Pregnancy No   Morbid Obesity (>100 pounds over ideal weight) Yes   Current Smoker No   Former Smoker No     Objective     Vitals:    04/19/19 1324   BP: 181/79   Pulse: 73   Resp: 20   Temp: 37.3 ??C (99.1 ??F)   TempSrc: Oral   SpO2: 93%   SpO2 recheck   95%    Physical Exam   Constitutional: He appears well-developed and well-nourished. No distress.   HENT:   Right Ear: External ear normal.   Left Ear: External ear normal.   Mouth/Throat: Oropharynx is clear and moist. No oropharyngeal exudate.   Nasal congestion bilaterally   Cardiovascular: Normal rate, regular rhythm and normal heart sounds. Pulmonary/Chest: Effort normal. No respiratory distress. He has wheezes.   Distant wheeze LLL   Abdominal: Soft. There is no abdominal tenderness. There is no rebound and no guarding.   Lymphadenopathy:     He has no cervical adenopathy.   Skin: Skin is warm and dry. He is not diaphoretic.     Scribe's Attestation: [employer] obtained and performed the history, physical exam and medical decision making elements that were entered into the chart.  Signed by Mikki Harbor, LPN serving as Scribe, on 04/19/2019 1:27 PM    The documentation recorded by the scribe accurately reflects the service I personally performed and the decisions made by me. Marya Amsler, DNP April 19, 2019 1:27 PM

## 2019-04-19 NOTE — Unmapped (Signed)
Patient Education        Learning About Coronavirus (COVID-19)  Coronavirus (COVID-19): Overview  What is coronavirus (COVID-19)?  The coronavirus disease (COVID-19) is caused by a virus. It is an illness that was first found in December 2019. It has since spread worldwide.  The virus can cause fever, cough, and trouble breathing. In severe cases, it can cause pneumonia and make it hard to breathe without help. It can cause death.  This virus spreads person-to-person through droplets from coughing and sneezing. It can also spread when you are close to someone who is infected. And it can spread when you touch something that has the virus on it, such as a doorknob or a tabletop.  Coronaviruses are a large group of viruses. They cause the common cold. They also cause more serious illnesses like Middle East respiratory syndrome (MERS) and severe acute respiratory syndrome (SARS). COVID-19 is caused by a novel coronavirus. That means it's a new type that has not been seen in people before.  How is COVID-19 treated?  Mild illness can be treated at home, but more serious illness needs to be treated in the hospital. Treatment may include medicines to reduce symptoms, plus breathing support such as oxygen therapy or a ventilator. Other treatments, such as antiviral medicines, may help people who have COVID-19.  What can you do to protect yourself from COVID-19?  The best way to protect yourself from getting sick is to:  ?? Avoid areas where there is an outbreak.  ?? Avoid contact with people who may be infected.  ?? Avoid crowds and try to stay at least 6 feet away from other people.  ?? Wash your hands often, especially after you cough or sneeze. Use soap and water, and scrub for at least 20 seconds. If soap and water aren't available, use an alcohol-based hand sanitizer.  ?? Avoid touching your mouth, nose, and eyes.  What can you do to avoid spreading the virus to others?  To help avoid spreading the virus to others: ?? Wash your hands often with soap or alcohol-based hand sanitizers.  ?? Cover your mouth with a tissue when you cough or sneeze. Then throw the tissue in the trash.  ?? Use a disinfectant to clean things that you touch often. These include doorknobs, remote controls, phones, and handles on your refrigerator and microwave. And don't forget countertops, tabletops, bathrooms, and computer keyboards.  ?? Wear a cloth face cover if you have to go to public areas.  If you know or suspect that you have COVID-19:  ?? Stay home. Don't go to school, work, or public areas. And don't use public transportation, ride-shares, or taxis unless you have no choice.  ?? Leave your home only if you need to get medical care or testing. But call the doctor's office first so they know you're coming. And wear a face cover.  ?? Limit contact with people in your home. If possible, stay in a separate bedroom and use a separate bathroom.  ?? Wear a face cover whenever you're around other people. It can help stop the spread of the virus when you cough or sneeze.  ?? Clean and disinfect your home every day. Use household cleaners and disinfectant wipes or sprays. Take special care to clean things that you grab with your hands.  ?? Self-isolate until it's safe to be around others again.  ? If you have symptoms, it's safe when you haven't had a fever for 3 days and your symptoms  have improved and it's been at least 10 days since your symptoms started.  ? If you were exposed to the virus but don't have symptoms, it's safe to be around others 14 days after exposure.  ? Talk to your doctor about whether you also need testing, especially if you have a weakened immune system.  When to call for help  Call 911 anytime you think you may need emergency care. For example, call if:  ?? You have severe trouble breathing. (You can't talk at all.)  ?? You have constant chest pain or pressure.  ?? You are severely dizzy or lightheaded. ?? You are confused or can't think clearly.  ?? Your face and lips have a blue color.  ?? You passed out (lost consciousness) or are very hard to wake up.  Call your doctor now if you develop symptoms such as:  ?? Shortness of breath.  ?? Fever.  ?? Cough.  If you need to get care, call ahead to the doctor's office for instructions before you go. Make sure you wear a face cover to prevent exposing other people to the virus.  Where can you get the latest information?  The following health organizations are tracking and studying this virus. Their websites contain the most up-to-date information. You'll also learn what to do if you think you may have been exposed to the virus.  ?? U.S. Centers for Disease Control and Prevention (CDC): The CDC provides updated news about the disease and travel advice. The website also tells you how to prevent the spread of infection. FootballExhibition.com.br  ?? World Health Organization [facility]): WHO offers information about the virus outbreaks. WHO also has travel advice. https://castaneda-walker.com/  Current as of: December 18, 2018??????????????????????????????Content Version: 12.6  ?? 2006-2020 Healthwise, Incorporated.   Care instructions adapted under license by [facility]. If you have questions about a medical condition or this instruction, always ask your healthcare professional. Healthwise, Incorporated disclaims any warranty or liability for your use of this information.

## 2019-04-19 NOTE — Unmapped (Signed)
TeleHealth Phone Encounter  This medical encounter was conducted virtually using [email or web address]  TeleHealth protocols.      I have identified myself to the patient and conveyed my credentials to [name]  In case we get disconnected, patient's phone number is [phone] (home)    Is there someone else in the room? No.      Assessment/Plan:      No diagnosis found.    [facility] Testing Criteria (v. 11/20/2018)  Note: Testing decisions remain up to individual providers and their patients within the parameters of these criteria.    ? Persons with symptoms of a possible infection with COVID-19, including: fever, cough, shortness of breath, chills, muscle pain, new loss of taste or smell, vomiting or diarrhea, and/or sore throat    ? Persons without symptoms who are considered to be at high risk of severe illness due to underlying health conditions  []  12 years old or older  []  Pregnant or postpartum within 2 weeks of delivery  []  Morbidly obese: BMI ? 40 or 100 pounds over ideal body weight       Any of the following chronic conditions:  []  Diabetes mellitus  []  Immunosuppression, including caused by medications or by HIV infection  []  Pulmonary disease, including asthma  []  Cardiovascular disease  []  Hypertensive disease  []  Renal disease  []  Hepatic disease  []  Hematologic disease, including sickle cell disease  []  Neurological condition that limits movement  []  Moderate to severe developmental delay      ? Persons without symptoms who are considered to be at high risk of severe illness who are from historically marginalized populations disproportionately affected by adverse COVID-19 outcomes    ? Persons without symptoms who have had close contact with someone who is confirmed positive within the last 21 days    Dispostion  Based upon the overall clinical picture, we recommend limited exam and evaluation for testing at one of our [facility]   To ER if SOB worsens.     Follow up No follow-ups on file.         Subjective:     Logan Quinn is a 38 y.o. male who presents  for a telephone visit with respiratory symptoms amidst COVID-19 outbreak.     HPI  C/o symptoms x 2 days.  C/o runny nose, cough, wheezing, chest tightness. PMH of chronic bronchitis x 2-3 years per patient.  Mild SOB.  Takes advair discus and albuterol for prn.  Using albuterol with relief.  No fever.  No known COVID contacts.     ROS  As per HPI.    I have reviewed the problem list, medications, and allergies and have updated/reconciled them if needed.          Objective:     As part of this Telephone Visit, no in-person exam was conducted.         I spent 5 minutes on the phone with the patient. I spent an additional 5 minutes on pre- and post-visit activities.     The patient was physically located in [place] or a state in which I am permitted to provide care. The patient and/or parent/guardian understood that s/he may incur co-pays and cost sharing, and agreed to the telemedicine visit. The visit was reasonable and appropriate under the circumstances given the patient's presentation at the time.    The patient and/or parent/guardian has been advised of the potential risks and limitations of this mode  of treatment (including, but not limited to, the absence of in-person examination) and has agreed to be treated using telemedicine. The patient's/patient's family's questions regarding telemedicine have been answered.     If the visit was completed in an ambulatory setting, the patient and/or parent/guardian has also been advised to contact their provider???s office for worsening conditions, and seek emergency medical treatment and/or call 911 if the patient deems either necessary.

## 2019-04-19 NOTE — Unmapped (Addendum)
-----   Message from Megan Salon, MD sent at 04/19/2019  9:59 AM EST -----  Regarding: Referral to [facility] from Provider  Symptomatic patient -- exam needed.    PAC scheduler was able to speak to the patient and give him an appointment for 04/19/19 @ 1:00 pm at [address], [address], [place], [place].

## 2019-04-20 NOTE — Unmapped (Signed)
Spoke to patient and gave him + Covid results.  His wife also is starting to have symptoms and is being tested today.    Advised him to watch his My Chart messages, as B. Scullin, AGNP will be reaching out to him.  Also advised him to go to nearest ED for increasing and worsening symptoms.    Patient voiced understanding and was in agreement with plan.    Carley Hammed, RN

## 2019-04-20 NOTE — Unmapped (Signed)
[facility] Pharmacist Intervention    Type of intervention: Hold Humira during Covid infection    Medication: Humira    Problem: Patient was due for a pharmacist check-in, and has now tested positive for COVID. I messaged with Dr. Elder Cyphers, who confirmed we should have [name] hold Humira doses for 2 weeks, and then if he's feeling better, we could have him resume.    Intervention: I called the patient to relay the message - he should hold doses Thurs 11/12 and 11/19. If he's feeling better on 11/26, he could resume. He has 2 doses left, so would have a supply if he decided to start back then.    Follow up needed: I will check in with patient ~ 11/30 to see how he's doing/see if he needs more Humira.    Approximate time spent: 15 minutes    Ora Bollig A Desiree [facility] Special[facility] Pharmacist

## 2019-04-21 MED ORDER — MISCELLANEOUS MEDICAL SUPPLY MISC: each | 0 refills | 0 days | Status: AC

## 2019-04-21 MED ORDER — ALBUTEROL SULFATE 2.5 MG/3 ML (0.083 %) SOLUTION FOR NEBULIZATION
RESPIRATORY_TRACT | 2 refills | 5 days | Status: CP | PRN
Start: 2019-04-21 — End: 2020-04-20

## 2019-04-21 MED ORDER — MISCELLANEOUS MEDICAL SUPPLY MISC
0 refills | 0.00000 days | Status: CP
Start: 2019-04-21 — End: 2019-04-21

## 2019-04-21 NOTE — Unmapped (Signed)
Pt requesting Diflucan for yeast secondary to Antibiotic therapy

## 2019-04-21 NOTE — Unmapped (Signed)
Addended by: Barbaraann Boys on: 04/21/2019 09:21 AM     Modules accepted: Orders

## 2019-04-21 NOTE — Unmapped (Signed)
Thanks for coming when you don't feel well.     We expect COVID result tomorrow.    I will send a MyChart message reviewing our discussion today.

## 2019-04-22 ENCOUNTER — Emergency Department: Admit: 2019-04-22 | Discharge: 2019-04-22 | Disposition: A | Discharge status: Home / Self Care | Payer: MEDICAID

## 2019-04-22 ENCOUNTER — Ambulatory Visit: Admit: 2019-04-22 | Discharge: 2019-04-22 | Disposition: A | Discharge status: Home / Self Care | Payer: MEDICAID

## 2019-04-22 LAB — CBC W/ AUTO DIFF
BASOPHILS ABSOLUTE COUNT: 0.1 10*9/L (ref 0.0–0.1)
BASOPHILS RELATIVE PERCENT: 0.8 %
EOSINOPHILS ABSOLUTE COUNT: 0.1 10*9/L (ref 0.0–0.4)
HEMATOCRIT: 43 % (ref 41.0–53.0)
HEMOGLOBIN: 14.9 g/dL (ref 13.5–17.5)
LARGE UNSTAINED CELLS: 2 % (ref 0–4)
LYMPHOCYTES ABSOLUTE COUNT: 2.5 10*9/L (ref 1.5–5.0)
LYMPHOCYTES RELATIVE PERCENT: 42.6 %
MEAN CORPUSCULAR HEMOGLOBIN CONC: 34.7 g/dL (ref 31.0–37.0)
MEAN CORPUSCULAR HEMOGLOBIN: 33.1 pg (ref 26.0–34.0)
MEAN CORPUSCULAR VOLUME: 95.4 fL (ref 80.0–100.0)
MEAN PLATELET VOLUME: 8.2 fL (ref 7.0–10.0)
MONOCYTES ABSOLUTE COUNT: 0.5 10*9/L (ref 0.2–0.8)
MONOCYTES RELATIVE PERCENT: 8.6 %
NEUTROPHILS ABSOLUTE COUNT: 2.6 10*9/L (ref 2.0–7.5)
NEUTROPHILS RELATIVE PERCENT: 45.4 %
PLATELET COUNT: 121 10*9/L — ABNORMAL LOW (ref 150–440)
RED BLOOD CELL COUNT: 4.5 10*12/L (ref 4.50–5.90)
RED CELL DISTRIBUTION WIDTH: 15.1 % — ABNORMAL HIGH (ref 12.0–15.0)
WBC ADJUSTED: 5.8 10*9/L (ref 4.5–11.0)

## 2019-04-22 LAB — TROPONIN I: Troponin I.cardiac:MCnc:Pt:Ser/Plas:Qn:: <0.06

## 2019-04-22 LAB — SMEAR REVIEW

## 2019-04-22 LAB — BUN / CREAT RATIO: Urea nitrogen/Creatinine:MRto:Pt:Ser/Plas:Qn:: 18

## 2019-04-22 LAB — BASIC METABOLIC PANEL
BLOOD UREA NITROGEN: 16 mg/dL (ref 7–21)
BUN / CREAT RATIO: 18
CALCIUM: 8.6 mg/dL (ref 8.5–10.2)
CHLORIDE: 109 mmol/L — ABNORMAL HIGH (ref 98–107)
CO2: 23 mmol/L (ref 22.0–30.0)
EGFR CKD-EPI AA MALE: >90 mL/min/{1.73_m2} (ref >=60)
EGFR CKD-EPI NON-AA MALE: >90 mL/min/{1.73_m2} (ref >=60)
GLUCOSE RANDOM: 217 mg/dL — ABNORMAL HIGH (ref 70–179)
POTASSIUM: 4.2 mmol/L (ref 3.5–5.0)
SODIUM: 141 mmol/L (ref 135–145)

## 2019-04-22 LAB — RED BLOOD CELL COUNT: Lab: 4.5

## 2019-04-22 MED ORDER — FLUCONAZOLE 150 MG TABLET
ORAL_TABLET | Freq: Once | ORAL | 0 refills | 2 days | Status: CP
Start: 2019-04-22 — End: 2019-04-22

## 2019-04-22 NOTE — Unmapped (Signed)
[facility]  Emergency Department Provider Note    ED Clinical Impression     Final diagnoses:   COVID-19 (Primary)   Dyspnea, unspecified type       Initial Impression, ED Course, Assessment and Plan     Impression: 38 year old male with a history of diabetes and hidradenitis suppurativa who presents for evaluation of worsening Covid symptoms.  Symptoms began 5 days ago, diagnosed 3 days ago.  Reports worsening shortness of breath and chest pressure this evening.    Hemodynamically stable on arrival here without tachycardia, hypotension, hypoxia, or fever.  Patient is generally well-appearing on exam with moist mucous membranes, no JVD or cervical lymphadenopathy, clear breath sounds throughout, normal heart tones, benign abdominal exam, and warm and well-perfused skin.  His chest pressure is reproducible on exam.    I suspect his symptoms are due to his known Covid virus but the differential also includes ACS, arrhythmia, superimposed pneumonia.  We will check a CBC, BMP, troponin, EKG, and chest x-ray.  We will give him 1000 g Tylenol, 325 mg aspirin, and a liter of LR while work-up is pending.    1:16 AM  Care signed out to the evening attending with the plan to follow-up on EKG and lab work.  Patient has been maintaining an SPO2 of 97% while sitting up.  If his cardiac work-up is unremarkable, plan to obtain ambulatory pulse ox.  If he is able to maintain sats greater than 90%, he will likely be stable for discharge home with ongoing symptomatic care.  I reviewed this with the patient and he is in agreement with this plan.        Additional Medical Decision Making     I have reviewed the vital signs and the nursing notes. Labs and radiology results that were available during my care of the patient were independently reviewed by me and considered in my medical decision making.     I staffed the case with the ED attending, Dr. Norlene Campbell.    I independently visualized the radiology images. I reviewed the patient's prior medical records.       Portions of this record have been created using Scientist, clinical (histocompatibility and immunogenetics). Dictation errors have been sought, but may not have been identified and corrected.  ____________________________________________       History     Chief Complaint  Shortness of Breath      HPI   Logan Quinn is a 38 y.o. male with a history of diabetes and hidradenitis suppurativa who presents for evaluation of worsening Covid symptoms.  Patient symptoms began 5 days ago.  He states he was having fevers, cough, shortness of breath, generalized body aches.  He also developed loss of smell.  He had a positive Covid test 3 days ago.  He had been doing reasonably well at home but this evening began to feel more short of breath.  He states it was worse when he is lying supine.  He felt a heavy pressure-like sensation on his chest when lying flat.  Symptoms did improve with sitting up.  He has not had an acute exacerbation of his cough.  He has been using his albuterol which she has for chronic bronchitis and states that it intermittently helps.    No weight gain, no lower extremity swelling.  No recent immobilization or surgery.  No recent lower extremity trauma.  Patient does not smoke and has no underlying lung disease.    Patient's wife also has  Covid and is doing well at home with mild symptoms.      Past Medical History:   Diagnosis Date   ??? Acne    ??? Allergic    ??? Anxiety    ??? Depression    ??? Diabetes mellitus (CMS-HCC)    ??? GERD (gastroesophageal reflux disease)    ??? Gout    ??? Hypertension    ??? IBS (irritable bowel syndrome)    ??? 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 Liver disease    ??? Migraines    ??? Morbid obesity with BMI of 60.0-69.9, adult (CMS-HCC)    ??? 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 Obstructive sleep apnea    ??? OSA on CPAP    ??? Severe obstructive sleep apnea    ??? Trapezius muscle strain 12/07/2013   ??? Urinary incontinence, nocturnal enuresis ??? Venous insufficiency        Patient Active Problem List   Diagnosis   ??? Diabetes type 2, uncontrolled (CMS-HCC)   ??? Lesion of radial nerve   ??? Obstructive sleep apnea   ??? GERD (gastroesophageal reflux disease) (RAF-HCC)   ??? Depression (RAF-HCC)   ??? Suicidal ideation   ??? Morbid obesity with BMI of 50.0-59.9, adult (CMS-HCC)   ??? Abdominal pain   ??? Headache   ??? NAFLD (nonalcoholic fatty liver disease)   ??? Bleeding external hemorrhoids   ??? Anxiety   ??? Diarrhea   ??? Obesity   ??? Type 2 diabetes mellitus with hyperglycemia (CMS-HCC)   ??? Left shoulder pain   ??? Diabetes mellitus (CMS-HCC)   ??? Trapezius muscle strain   ??? Primary insomnia   ??? Obesity, morbid, BMI 50 or higher (CMS-HCC)   ??? Chronic joint pain   ??? Cellulitis of right lower extremity   ??? No diabetic retinopathy in both eyes   ??? Congenital hypertrophy of retinal pigment epithelium   ??? H/O retinal detachment   ??? IBS (irritable bowel syndrome)   ??? Urinary incontinence, nocturnal enuresis   ??? Severe obstructive sleep apnea   ??? OSA on CPAP   ??? Hidradenitis suppurativa       Past Surgical History:   Procedure Laterality Date   ??? EYE SURGERY  11/15   ??? PR COLONOSCOPY FLX DX W/COLLJ SPEC WHEN PFRMD N/A 03/24/2013    Procedure: COLONOSCOPY, FLEXIBLE, PROXIMAL TO SPLENIC FLEXURE; DIAGNOSTIC, W/WO COLLECTION SPECIMEN BY BRUSH OR WASH;  Surgeon: Clint Bolder, MD;  [facility];  Service: Gastroenterology   ??? PR EYE SURG POST SGMT PROC UNLISTED Left     pneumatic retinopexy OS   ??? PR UPPER GI ENDOSCOPY,DIAGNOSIS N/A 02/02/2013    Procedure: UGI ENDO, INCLUDE ESOPHAGUS, STOMACH, & DUODENUM &/OR JEJUNUM; DX W/WO COLLECTION SPECIMN, BY BRUSH OR WASH;  Surgeon: Malcolm Metro, MD;  Location: [facility];  Service: Gastroenterology   ??? [place] PYLORIC STENOSIS ([place] HISTORICAL RESULT)           Current Facility-Administered Medications:   ???  acetaminophen (TYLENOL) tablet 1,000 mg, 1,000 mg, Oral, Once, Barrett Shell, MD ???  aspirin tablet 325 mg, 325 mg, Oral, Once, Barrett Shell, MD  ???  lactated ringers bolus 1,000 mL, 1,000 mL, Intravenous, Once, Barrett Shell, MD    Current Outpatient Medications:   ???  acetaminophen (TYLENOL) 500 MG tablet, Take 2 tablets (1,000 mg total) by mouth Three (3) times a day., Disp: 30 tablet, Rfl: 0  ???  ADVAIR DISKUS 500-50  mcg/dose diskus, Inhale 1 puff 2 (two) times a day., Disp: 13 Inhaler, Rfl: 1  ???  albuterol 2.5 mg /3 mL (0.083 %) nebulizer solution, Inhale 3 mL (2.5 mg total) by nebulization every four (4) hours as needed for wheezing., Disp: 25 vial, Rfl: 2  ???  albuterol HFA 90 mcg/actuation inhaler, Inhale 2 puffs every six (6) hours as needed for wheezing., Disp: 3 Inhaler, Rfl: 1  ???  ALPRAZolam (XANAX) 0.5 MG tablet, Take 1 tablet (0.5 mg total) by mouth Three (3) times a day. (Patient taking differently: Take 1 mg by mouth Three (3) times a day. Usually takes nightly for anxiety), Disp: 60 tablet, Rfl: 0  ???  blood sugar diagnostic Strp, by Other route Three (3) times a day before meals., Disp: 300 each, Rfl: 1  ???  blood-glucose meter kit, Use as directed, Disp: 1 each, Rfl: 0  ???  cephalexin (KEFLEX) 500 MG capsule, Take 1 capsule (500 mg total) by mouth Four (4) times a day., Disp: 120 capsule, Rfl: 0  ???  cetirizine (ZYRTEC) 10 MG tablet, Take 1 tablet (10 mg total) by mouth nightly as needed for allergies., Disp: 90 tablet, Rfl: 1  ???  clindamycin (CLEOCIN T) 1 % external solution, Apply topically Two (2) times a day., Disp: 60 mL, Rfl: 4  ???  clindamycin (CLEOCIN T) 1 % lotion, Apply nightly to the inflamed areas if needed for flares, Disp: 60 mL, Rfl: 5  ???  clobetasoL (TEMOVATE) 0.05 % ointment, Apply daily to painful affected areas if needed for flares, then stop, Disp: 15 g, Rfl: 1  ???  clotrimazole-betamethasone (LOTRISONE) 1-0.05 % cream, Apply topically Two (2) times a day., Disp: 60 g, Rfl: 1 ???  cyclobenzaprine (FLEXERIL) 10 MG tablet, Take 1 tablet (10 mg total) by mouth Three (3) times a day as needed for muscle spasms., Disp: 60 tablet, Rfl: 0  ???  dextroamphetamine-amphetamine (ADDERALL) 20 mg tablet, Take 1 tablet by mouth Two (2) times a day., Disp: , Rfl:   ???  diclofenac (VOLTAREN) 50 MG EC tablet, Take 1 tablet (50 mg total) by mouth two (2) times a day as needed. (Patient not taking: Reported on 04/13/2019), Disp: 60 each, Rfl: 2  ???  dicyclomine (BENTYL) 10 mg capsule, Take 1 capsule (10 mg total) by mouth Three (3) times a day., Disp: 270 capsule, Rfl: 1  ???  diphenoxylate-atropine (LOMOTIL) 2.5-0.025 mg per tablet, Take 1 tablet by mouth two (2) times a day as needed for diarrhea., Disp: 30 tablet, Rfl: 5  ???  empty container (SHARPS CONTAINER) Misc, Use as directed to dispose of Humira needles, Disp: 1 each, Rfl: 2  ???  empty container Misc, Use as directed to dispose of Humira injections, Disp: 1 each, Rfl: 2  ???  erenumab-aooe (AIMOVIG AUTOINJECTOR) 70 mg/mL AtIn, Inject 1 mL under the skin every twenty-eight (28) days., Disp: 3 Syringe, Rfl: 1  ???  ergocalciferol (DRISDOL) 1,250 mcg (50,000 unit) capsule, Take 1 capsule (50,000 Units total) by mouth once a week., Disp: 4 capsule, Rfl: 2  ???  famotidine (PEPCID) 20 MG tablet, Take 1 tablet (20 mg total) by mouth Two (2) times a day., Disp: 180 tablet, Rfl: 1  ???  fluticasone propionate (FLONASE) 50 mcg/actuation nasal spray, 1 spray by Each Nare route Two (2) times a day., Disp: 3 Bottle, Rfl: 3  ???  glipiZIDE (GLUCOTROL) 10 MG tablet, Take 2 tablets (20 mg total) by mouth Two (2) times a day (30  minutes before a meal)., Disp: 360 tablet, Rfl: 1  ???  HUMIRA PEN CITRATE FREE 40 MG/0.4 ML, Inject the contents of 2 pens (80mg  total) under the skin every seven (7) days., Disp: 8 each, Rfl: 3  ???  hydrocortisone (PROCTOSOL HC) 2.5 % rectal cream, Insert 1 application into the rectum two (2) times a day as needed., Disp: 28.35 g, Rfl: 2 ???  ibuprofen (ADVIL,MOTRIN) 800 MG tablet, Take 1 tablet (800 mg total) by mouth every eight (8) hours as needed., Disp: 90 tablet, Rfl: 1  ???  insulin glargine (LANTUS U-100 INSULIN) 100 unit/mL injection, Inject 0.8 mL (80 Units total) under the skin nightly., Disp: 72 mL, Rfl: 1  ???  insulin regular (HUMULIN R REGULAR U-100 INSULN) 100 unit/mL injection, SLIDING SCALE BLOOD SUGAR 0-150=0 UNITS,150-200=3 UNITS, 200-250=6 UNITS,250-300=9 UNITS, 300-350=12 UNITS, Disp: 40 mL, Rfl: 1  ???  insulin syringe-needle U-100 (BD INSULIN SYRINGE ULTRA-FINE) 1 mL 31 gauge x 5/16 (8 mm) Syrg, USE AS DIRECTED., Disp: 100 each, Rfl: 1  ???  ketoconazole (NIZORAL) 2 % cream, Apply 1 application topically daily., Disp: 15 g, Rfl: 11  ???  lancets Misc, 1 each by Miscellaneous route Three (3) times a day before meals., Disp: 300 each, Rfl: 1  ???  levothyroxine (SYNTHROID) 50 MCG tablet, Take 1 tablet (50 mcg total) by mouth daily., Disp: 90 tablet, Rfl: 1  ???  lidocaine (XYLOCAINE) 5 % ointment, lidocaine 5 % topical ointment, Disp: , Rfl:   ???  miscellaneous medical supply Misc, Gauze pads 4 x 4 inches and paper tape, Disp: 30 each, Rfl: 0  ???  miscellaneous medical supply Misc, Nebulizer- Use as directed with albuterol nebs for SOB, Disp: 1 each, Rfl: 0  ???  MITIGARE 0.6 mg cap capsule, Take 1.2 mg on Day 1 of flare, followed by 0.6 mg one hour later. Day 2 take 0.6 mg BID until flare resolves., Disp: 60 capsule, Rfl: 3  ???  montelukast (SINGULAIR) 10 mg tablet, Take 1 tablet (10 mg total) by mouth daily., Disp: 90 each, Rfl: 1  ???  oxybutynin (DITROPAN) 5 MG tablet, Take 1 tablet (5 mg total) by mouth Two (2) times a day., Disp: 180 tablet, Rfl: 1  ???  oxyCODONE-acetaminophen (PERCOCET) 5-325 mg per tablet, Take 1 tablet by mouth every six (6) hours as needed for pain. (Patient not taking: Reported on 04/13/2019), Disp: 8 tablet, Rfl: 0 ???  polyethylene glycol (GLYCOLAX) 17 gram/dose powder, take 17GM (DISSOLVED IN WATER) by mouth once daily, Disp: , Rfl:   ???  PROAIR HFA 90 mcg/actuation inhaler, Inhale 2 puffs every four (4) hours as needed for wheezing., Disp: 3 Inhaler, Rfl: 1  ???  promethazine (PHENERGAN) 25 MG tablet, Take 1-2 tablets (25-50 mg total) by mouth nightly as needed for nausea., Disp: 30 each, Rfl: 2  ???  propranoloL (INDERAL LA) 120 mg 24 hr capsule, Take 1 capsule (120 mg total) by mouth daily., Disp: 90 capsule, Rfl: 1  ???  topiramate (TOPAMAX) 25 MG tablet, Take 1 tablet (25 mg total) by mouth Two (2) times a day., Disp: 180 tablet, Rfl: 1  ???  traZODone (DESYREL) 100 MG tablet, take 2 tablets by mouth at bedtime, Disp: , Rfl: 0    Allergies  Erythromycin, Penicillins, Sulfa (sulfonamide antibiotics), Other, Erythromycin base, and Sulfasalazine    Family History   Problem Relation Age of Onset   ??? Cancer Maternal Grandfather    ??? Hearing loss Maternal Grandfather    ???  Cancer Paternal Grandfather    ??? COPD Paternal Grandmother    ??? Arthritis Paternal Grandmother    ??? Depression Paternal Grandmother    ??? Diabetes Mother    ??? Heart disease Mother    ??? Migraines Mother    ??? Arthritis Mother    ??? Depression Mother    ??? GER disease Mother    ??? Hypertension Mother    ??? Angina Mother    ??? COPD Mother    ??? Glaucoma Mother    ??? Diabetes Sister    ??? Migraines Sister    ??? Asthma Sister    ??? Depression Sister    ??? Diabetes Brother    ??? Asthma Brother    ??? Diabetes Maternal Grandmother    ??? Heart disease Maternal Grandmother    ??? Migraines Maternal Grandmother    ??? Depression Maternal Grandmother    ??? Angina Maternal Grandmother    ??? Hypertension Maternal Grandmother    ??? Stroke Maternal Grandmother    ??? Diabetes Maternal Uncle    ??? Diabetes Maternal Uncle    ??? Liver disease Maternal Uncle    ??? Kidney disease Maternal Uncle    ??? Asthma Brother    ??? Colorectal Cancer Neg Hx    ??? Esophageal cancer Neg Hx    ??? Liver cancer Neg Hx ??? Pancreatic cancer Neg Hx    ??? Stomach cancer Neg Hx    ??? Amblyopia Neg Hx    ??? Blindness Neg Hx    ??? Retinal detachment Neg Hx    ??? Strabismus Neg Hx    ??? Macular degeneration Neg Hx    ??? Anesthesia problems Neg Hx    ??? Broken bones Neg Hx    ??? Clotting disorder Neg Hx    ??? Collagen disease Neg Hx    ??? Dislocations Neg Hx    ??? Fibromyalgia Neg Hx    ??? Gout Neg Hx    ??? Hemophilia Neg Hx    ??? Osteoporosis Neg Hx    ??? Rheumatologic disease Neg Hx    ??? Scoliosis Neg Hx    ??? Severe sprains Neg Hx    ??? Sickle cell anemia Neg Hx    ??? Spinal Compression Fracture Neg Hx    ??? Melanoma Neg Hx    ??? Basal cell carcinoma Neg Hx    ??? Squamous cell carcinoma Neg Hx    ??? Deep vein thrombosis Neg Hx        Social History  Social History     Tobacco Use   ??? Smoking status: Never Smoker   ??? Smokeless tobacco: Never Used   Substance Use Topics   ??? Alcohol use: Never     Alcohol/week: 0.0 standard drinks     Comment: rare social   ??? Drug use: Never       Review of Systems  Constitutional: Fever  Eyes: Negative for visual changes.  ENT: Negative for sore throat.  CV: Chest pressure  Resp: Cough  GI: Negative for nausea, vomiting, or abdominal pain.  GU: Negative for dysuria or hematuria.  MSK: Negative for back pain.  Derm: Negative for rash.  Neuro: Negative for headaches.      Physical Exam     ED Triage Vitals   Enc Vitals Group      BP 04/21/19 2300 158/104      Heart Rate 04/21/19 2300 86      SpO2 Pulse --  Resp 04/21/19 2300 17      Temp 04/21/19 2306 36.7 ??C (98.1 ??F)      Temp Source 04/21/19 2306 Oral      SpO2 04/21/19 2300 96 %      Weight 04/21/19 2300 (!) 218.2 kg (481 lb)      Height 04/21/19 2300 1.803 m (5' 11)      Head Circumference --       Peak Flow --       Pain Score --       Pain Loc --       Pain Edu? --       Excl. in GC? --        Constitutional: Appears stated age, sitting up in the stretcher resting comfortably in no acute distress. HEENT: Normocephalic and atraumatic.Conjunctivae clear. No congestion. Moist mucous membranes. No cervical lymphadenopathy.   Heme/Lymph/Immuno: No petechiae or bruising  CV: RRR, no murmurs. Symmetric pulses in all extremities. No JVD or peripheral edema.  Resp: Clear to auscultation bilaterally. No wheezes or rhonchii.  Normal work of breathing.  GI: Soft and non tender, non distended. No rebound, rigidity, or guarding.   MSK: Normal range of motion in all extremities.  Reproducible chest wall tenderness  Neuro: Normal speech and language. No gross focal neurologic deficits appreciated.  Skin: Warm, dry and intact.  Psychiatric: Mood and affect are normal. Speech and behavior are normal.    EKG     Pending    Radiology     XR Chest Portable    (Results Pending)         Procedures     None                Barrett Shell, MD  Resident  04/22/19 0120

## 2019-04-22 NOTE — Unmapped (Signed)
Rx sent 

## 2019-04-22 NOTE — Unmapped (Signed)
Pt with cough that began Saturday. Pt tested positive for covid 19 on Monday. Tonight pt reports shortness of breath when lying back. Pt reports minimal improvement with MDI

## 2019-04-25 LAB — CBC W/ AUTO DIFF
BASOPHILS ABSOLUTE COUNT: 0 10*9/L (ref 0.0–0.1)
BASOPHILS RELATIVE PERCENT: 0.4 %
EOSINOPHILS ABSOLUTE COUNT: 0.1 10*9/L (ref 0.0–0.4)
EOSINOPHILS RELATIVE PERCENT: 1.2 %
HEMATOCRIT: 42.4 % (ref 41.0–53.0)
HEMOGLOBIN: 14.4 g/dL (ref 13.5–17.5)
LARGE UNSTAINED CELLS: 1 % (ref 0–4)
LYMPHOCYTES ABSOLUTE COUNT: 2.2 10*9/L (ref 1.5–5.0)
MEAN CORPUSCULAR HEMOGLOBIN CONC: 34.1 g/dL (ref 31.0–37.0)
MEAN CORPUSCULAR HEMOGLOBIN: 32.2 pg (ref 26.0–34.0)
MEAN CORPUSCULAR VOLUME: 94.6 fL (ref 80.0–100.0)
MEAN PLATELET VOLUME: 9 fL (ref 7.0–10.0)
MONOCYTES ABSOLUTE COUNT: 0.4 10*9/L (ref 0.2–0.8)
MONOCYTES RELATIVE PERCENT: 4.9 %
NEUTROPHILS ABSOLUTE COUNT: 6 10*9/L (ref 2.0–7.5)
NEUTROPHILS RELATIVE PERCENT: 67.6 %
PLATELET COUNT: 121 10*9/L — ABNORMAL LOW (ref 150–440)
RED BLOOD CELL COUNT: 4.48 10*12/L — ABNORMAL LOW (ref 4.50–5.90)
RED CELL DISTRIBUTION WIDTH: 15 % (ref 12.0–15.0)
WBC ADJUSTED: 8.9 10*9/L (ref 4.5–11.0)

## 2019-04-25 LAB — BASIC METABOLIC PANEL
ANION GAP: 8 mmol/L (ref 7–15)
BLOOD UREA NITROGEN: 9 mg/dL (ref 7–21)
BUN / CREAT RATIO: 12
CALCIUM: 8.9 mg/dL (ref 8.5–10.2)
CHLORIDE: 107 mmol/L (ref 98–107)
CREATININE: 0.75 mg/dL (ref 0.70–1.30)
EGFR CKD-EPI AA MALE: >90 mL/min/{1.73_m2} (ref >=60)
EGFR CKD-EPI NON-AA MALE: >90 mL/min/{1.73_m2} (ref >=60)
GLUCOSE RANDOM: 173 mg/dL (ref 70–179)
POTASSIUM: 4 mmol/L (ref 3.5–5.0)
SODIUM: 140 mmol/L (ref 135–145)

## 2019-04-25 LAB — BASOPHILS RELATIVE PERCENT: Basophils/100 leukocytes:NFr:Pt:Bld:Qn:Automated count: 0.4

## 2019-04-25 LAB — TROPONIN I: Troponin I.cardiac:MCnc:Pt:Ser/Plas:Qn:: <0.06

## 2019-04-25 LAB — BLOOD UREA NITROGEN: Urea nitrogen:MCnc:Pt:Ser/Plas:Qn:: 9

## 2019-04-26 ENCOUNTER — Ambulatory Visit: Admit: 2019-04-26 | Discharge: 2019-04-26 | Disposition: A | Discharge status: Home / Self Care | Payer: MEDICAID

## 2019-04-26 ENCOUNTER — Emergency Department: Admit: 2019-04-26 | Discharge: 2019-04-26 | Disposition: A | Discharge status: Home / Self Care | Payer: MEDICAID

## 2019-04-26 LAB — TROPONIN I: Troponin I.cardiac:MCnc:Pt:Ser/Plas:Qn:: <0.06

## 2019-04-26 NOTE — Unmapped (Signed)
Took tylenol/ibuprofen 2 hours ago. Pt having fever and aching in head, chest and back. Has cough and sob, extreme dyspnea with exertion.  Cough with clear sputum, but this am was brown and red. No n/v/d.

## 2019-04-26 NOTE — Unmapped (Signed)
[facility]  Emergency Department Provider Note    ED Course, Assessment and Plan     Initial Clinical Impression:    April 25, 2019 7:54 PM   Logan Quinn is a 38 y.o. male with PMH of HTN, T2DM with neuropathy, GERD, IBS, depression, and anxiety presenting for a constellation of symptoms including chest pain, shortness of breath, dizziness, weakness as described below. On exam, Vital signs stable satting 94% at rest and speaking and short sentences.  Overall well-appearing.  Normal cardiopulmonary exam.  Breathing 20-24 times per minute.  Normal abdominal exam.  Normal neurologic exam.  Exam remarkable for obese male in very mild respiratory distress    BP 117/67  - Pulse 77  - Temp 37.3 ??C (99.1 ??F) (Oral)  - Resp 24  - Ht 180.3 cm (5' 11)  - Wt (!) 221.8 kg (489 lb)  - SpO2 96%  - BMI 68.20 kg/m??     Patient symptoms are likely due to Covid.  However, he does describe this chest squeezing pressure that is mildly concerning with respect risk for ACS.  Less concern for PE given description of symptoms and lack of tachycardia.  His vitals are within normal limits and he is only hypoxic to the mid 90s with ambulation.  There is no indication for admission from a Covid perspective.    Will obtain CBC, BMP, troponin, EKG, chest x-ray. Will treat patient with LR and Zofran.    ED Course:    21:49  CXR unremarkable. Troponin negative. Labs are otherwise unremarkable. Will order second troponin.       Second troponin negative.  Patient discharged.    Discussed treatment plan and return precautions extensively. Patient voiced a clear understanding and was amenable with the plan.    Pt stable for discharge         _____________________________________________________________________    The case was discussed with attending physician who is in agreement with the above assessment and plan Dictation software was used while making this note. Please excuse any errors made with dictation software.    Additional Medical Decision Making     I have reviewed the vital signs and the nursing notes. Labs and radiology results that were available during my care of the patient were independently reviewed by me and considered in my medical decision making.     I independently visualized the EKG tracing if performed  I independently visualized the radiology images if performed  I reviewed the patient's prior medical records if available.  Additional history obtained from family if available    History     CHIEF COMPLAINT:   Chief Complaint   Patient presents with   ??? Fever Between 29 Weeks and 38 Years Old       HPI: Logan Quinn is a 38 y.o. male with past medical history of HTN, T2DM with neuropathy, GERD, IBS, depression, and anxiety presenting to the ED for evaluation of constellation of symptoms including chest pain, shortness of breath, dizziness, weakness, cough.  Patient began having symptoms of Covid last Saturday and initially thought was a bronchitis.  He tested positive this past week.  Since that time, he developed significantly worsening shortness of breath.  To the point where he is only able to walk from the bathroom to his bedroom without having to take a rest.  Beginning this morning, he developed squeezing substernal chest pain that did not radiate.  It was associated with diaphoresis.  The patient has been persistently nauseous but not specifically nauseous during these episodes.  He has had intermittent fever and chills, cough, weakness, dizziness.    PAST MEDICAL HISTORY/PAST SURGICAL HISTORY:   Past Medical History:   Diagnosis Date   ??? Acne    ??? Allergic    ??? Anxiety    ??? Depression    ??? Diabetes mellitus (CMS-HCC)    ??? GERD (gastroesophageal reflux disease)    ??? Gout    ??? Hypertension    ??? IBS (irritable bowel syndrome)    ??? 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 Liver disease ??? Migraines    ??? Morbid obesity with BMI of 60.0-69.9, adult (CMS-HCC)    ??? 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 Obstructive sleep apnea    ??? OSA on CPAP    ??? Severe obstructive sleep apnea    ??? Trapezius muscle strain 12/07/2013   ??? Urinary incontinence, nocturnal enuresis    ??? Venous insufficiency      Past Surgical History:   Procedure Laterality Date   ??? EYE SURGERY  11/15   ??? PR COLONOSCOPY FLX DX W/COLLJ SPEC WHEN PFRMD N/A 03/24/2013    Procedure: COLONOSCOPY, FLEXIBLE, PROXIMAL TO SPLENIC FLEXURE; DIAGNOSTIC, W/WO COLLECTION SPECIMEN BY BRUSH OR WASH;  Surgeon: Clint Bolder, MD;  [facility];  Service: Gastroenterology   ??? PR EYE SURG POST SGMT PROC UNLISTED Left     pneumatic retinopexy OS   ??? PR UPPER GI ENDOSCOPY,DIAGNOSIS N/A 02/02/2013    Procedure: UGI ENDO, INCLUDE ESOPHAGUS, STOMACH, & DUODENUM &/OR JEJUNUM; DX W/WO COLLECTION SPECIMN, BY BRUSH OR WASH;  Surgeon: Malcolm Metro, MD;  Location: [facility];  Service: Gastroenterology   ??? [place] PYLORIC STENOSIS ( HISTORICAL RESULT)       MEDICATIONS:   No current facility-administered medications for this encounter.     Current Outpatient Medications:   ???  acetaminophen (TYLENOL) 500 MG tablet, Take 2 tablets (1,000 mg total) by mouth Three (3) times a day., Disp: 30 tablet, Rfl: 0  ???  ADVAIR DISKUS 500-50 mcg/dose diskus, Inhale 1 puff 2 (two) times a day., Disp: 13 Inhaler, Rfl: 1  ???  albuterol 2.5 mg /3 mL (0.083 %) nebulizer solution, Inhale 3 mL (2.5 mg total) by nebulization every four (4) hours as needed for wheezing., Disp: 25 vial, Rfl: 2  ???  albuterol HFA 90 mcg/actuation inhaler, Inhale 2 puffs every six (6) hours as needed for wheezing., Disp: 3 Inhaler, Rfl: 1  ???  ALPRAZolam (XANAX) 0.5 MG tablet, Take 1 tablet (0.5 mg total) by mouth Three (3) times a day. (Patient taking differently: Take 1 mg by mouth Three (3) times a day. Usually takes nightly for anxiety), Disp: 60 tablet, Rfl: 0 ???  blood sugar diagnostic Strp, by Other route Three (3) times a day before meals., Disp: 300 each, Rfl: 1  ???  blood-glucose meter kit, Use as directed, Disp: 1 each, Rfl: 0  ???  cephalexin (KEFLEX) 500 MG capsule, Take 1 capsule (500 mg total) by mouth Four (4) times a day., Disp: 120 capsule, Rfl: 0  ???  cetirizine (ZYRTEC) 10 MG tablet, Take 1 tablet (10 mg total) by mouth nightly as needed for allergies., Disp: 90 tablet, Rfl: 1  ???  clindamycin (CLEOCIN T) 1 % external solution, Apply topically Two (2) times a day., Disp: 60 mL, Rfl: 4  ???  clindamycin (CLEOCIN T) 1 % lotion,  Apply nightly to the inflamed areas if needed for flares, Disp: 60 mL, Rfl: 5  ???  clobetasoL (TEMOVATE) 0.05 % ointment, Apply daily to painful affected areas if needed for flares, then stop, Disp: 15 g, Rfl: 1  ???  clotrimazole-betamethasone (LOTRISONE) 1-0.05 % cream, Apply topically Two (2) times a day., Disp: 60 g, Rfl: 1  ???  cyclobenzaprine (FLEXERIL) 10 MG tablet, Take 1 tablet (10 mg total) by mouth Three (3) times a day as needed for muscle spasms., Disp: 60 tablet, Rfl: 0  ???  dextroamphetamine-amphetamine (ADDERALL) 20 mg tablet, Take 1 tablet by mouth Two (2) times a day., Disp: , Rfl:   ???  diclofenac (VOLTAREN) 50 MG EC tablet, Take 1 tablet (50 mg total) by mouth two (2) times a day as needed. (Patient not taking: Reported on 04/13/2019), Disp: 60 each, Rfl: 2  ???  dicyclomine (BENTYL) 10 mg capsule, Take 1 capsule (10 mg total) by mouth Three (3) times a day., Disp: 270 capsule, Rfl: 1  ???  diphenoxylate-atropine (LOMOTIL) 2.5-0.025 mg per tablet, Take 1 tablet by mouth two (2) times a day as needed for diarrhea., Disp: 30 tablet, Rfl: 5  ???  empty container (SHARPS CONTAINER) Misc, Use as directed to dispose of Humira needles, Disp: 1 each, Rfl: 2  ???  empty container Misc, Use as directed to dispose of Humira injections, Disp: 1 each, Rfl: 2 ???  erenumab-aooe (AIMOVIG AUTOINJECTOR) 70 mg/mL AtIn, Inject 1 mL under the skin every twenty-eight (28) days., Disp: 3 Syringe, Rfl: 1  ???  ergocalciferol (DRISDOL) 1,250 mcg (50,000 unit) capsule, Take 1 capsule (50,000 Units total) by mouth once a week., Disp: 4 capsule, Rfl: 2  ???  famotidine (PEPCID) 20 MG tablet, Take 1 tablet (20 mg total) by mouth Two (2) times a day., Disp: 180 tablet, Rfl: 1  ???  fluticasone propionate (FLONASE) 50 mcg/actuation nasal spray, 1 spray by Each Nare route Two (2) times a day., Disp: 3 Bottle, Rfl: 3  ???  glipiZIDE (GLUCOTROL) 10 MG tablet, Take 2 tablets (20 mg total) by mouth Two (2) times a day (30 minutes before a meal)., Disp: 360 tablet, Rfl: 1  ???  HUMIRA PEN CITRATE FREE 40 MG/0.4 ML, Inject the contents of 2 pens (80mg  total) under the skin every seven (7) days., Disp: 8 each, Rfl: 3  ???  hydrocortisone (PROCTOSOL HC) 2.5 % rectal cream, Insert 1 application into the rectum two (2) times a day as needed., Disp: 28.35 g, Rfl: 2  ???  ibuprofen (ADVIL,MOTRIN) 800 MG tablet, Take 1 tablet (800 mg total) by mouth every eight (8) hours as needed., Disp: 90 tablet, Rfl: 1  ???  insulin glargine (LANTUS U-100 INSULIN) 100 unit/mL injection, Inject 0.8 mL (80 Units total) under the skin nightly., Disp: 72 mL, Rfl: 1  ???  insulin regular (HUMULIN R REGULAR U-100 INSULN) 100 unit/mL injection, SLIDING SCALE BLOOD SUGAR 0-150=0 UNITS,150-200=3 UNITS, 200-250=6 UNITS,250-300=9 UNITS, 300-350=12 UNITS, Disp: 40 mL, Rfl: 1  ???  insulin syringe-needle U-100 (BD INSULIN SYRINGE ULTRA-FINE) 1 mL 31 gauge x 5/16 (8 mm) Syrg, USE AS DIRECTED., Disp: 100 each, Rfl: 1  ???  ketoconazole (NIZORAL) 2 % cream, Apply 1 application topically daily., Disp: 15 g, Rfl: 11  ???  lancets Misc, 1 each by Miscellaneous route Three (3) times a day before meals., Disp: 300 each, Rfl: 1  ???  levothyroxine (SYNTHROID) 50 MCG tablet, Take 1 tablet (50 mcg total) by mouth daily., Disp:  90 tablet, Rfl: 1 ???  lidocaine (XYLOCAINE) 5 % ointment, lidocaine 5 % topical ointment, Disp: , Rfl:   ???  miscellaneous medical supply Misc, Gauze pads 4 x 4 inches and paper tape, Disp: 30 each, Rfl: 0  ???  miscellaneous medical supply Misc, Nebulizer- Use as directed with albuterol nebs for SOB, Disp: 1 each, Rfl: 0  ???  MITIGARE 0.6 mg cap capsule, Take 1.2 mg on Day 1 of flare, followed by 0.6 mg one hour later. Day 2 take 0.6 mg BID until flare resolves., Disp: 60 capsule, Rfl: 3  ???  montelukast (SINGULAIR) 10 mg tablet, Take 1 tablet (10 mg total) by mouth daily., Disp: 90 each, Rfl: 1  ???  oxybutynin (DITROPAN) 5 MG tablet, Take 1 tablet (5 mg total) by mouth Two (2) times a day., Disp: 180 tablet, Rfl: 1  ???  oxyCODONE-acetaminophen (PERCOCET) 5-325 mg per tablet, Take 1 tablet by mouth every six (6) hours as needed for pain. (Patient not taking: Reported on 04/13/2019), Disp: 8 tablet, Rfl: 0  ???  polyethylene glycol (GLYCOLAX) 17 gram/dose powder, take 17GM (DISSOLVED IN WATER) by mouth once daily, Disp: , Rfl:   ???  PROAIR HFA 90 mcg/actuation inhaler, Inhale 2 puffs every four (4) hours as needed for wheezing., Disp: 3 Inhaler, Rfl: 1  ???  promethazine (PHENERGAN) 25 MG tablet, Take 1-2 tablets (25-50 mg total) by mouth nightly as needed for nausea., Disp: 30 each, Rfl: 2  ???  propranoloL (INDERAL LA) 120 mg 24 hr capsule, Take 1 capsule (120 mg total) by mouth daily., Disp: 90 capsule, Rfl: 1  ???  topiramate (TOPAMAX) 25 MG tablet, Take 1 tablet (25 mg total) by mouth Two (2) times a day., Disp: 180 tablet, Rfl: 1  ???  traZODone (DESYREL) 100 MG tablet, take 2 tablets by mouth at bedtime, Disp: , Rfl: 0    ALLERGIES:   Erythromycin, Penicillins, Sulfa (sulfonamide antibiotics), Other, Erythromycin base, and Sulfasalazine    SOCIAL HISTORY:   Social History     Tobacco Use   ??? Smoking status: Never Smoker   ??? Smokeless tobacco: Never Used   Substance Use Topics   ??? Alcohol use: Never     Alcohol/week: 0.0 standard drinks Comment: rare social       FAMILY HISTORY:  Family History   Problem Relation Age of Onset   ??? Cancer Maternal Grandfather    ??? Hearing loss Maternal Grandfather    ??? Cancer Paternal Grandfather    ??? COPD Paternal Grandmother    ??? Arthritis Paternal Grandmother    ??? Depression Paternal Grandmother    ??? Diabetes Mother    ??? Heart disease Mother    ??? Migraines Mother    ??? Arthritis Mother    ??? Depression Mother    ??? GER disease Mother    ??? Hypertension Mother    ??? Angina Mother    ??? COPD Mother    ??? Glaucoma Mother    ??? Diabetes Sister    ??? Migraines Sister    ??? Asthma Sister    ??? Depression Sister    ??? Diabetes Brother    ??? Asthma Brother    ??? Diabetes Maternal Grandmother    ??? Heart disease Maternal Grandmother    ??? Migraines Maternal Grandmother    ??? Depression Maternal Grandmother    ??? Angina Maternal Grandmother    ??? Hypertension Maternal Grandmother    ??? Stroke Maternal Grandmother    ???  Diabetes Maternal Uncle    ??? Diabetes Maternal Uncle    ??? Liver disease Maternal Uncle    ??? Kidney disease Maternal Uncle    ??? Asthma Brother    ??? Colorectal Cancer Neg Hx    ??? Esophageal cancer Neg Hx    ??? Liver cancer Neg Hx    ??? Pancreatic cancer Neg Hx    ??? Stomach cancer Neg Hx    ??? Amblyopia Neg Hx    ??? Blindness Neg Hx    ??? Retinal detachment Neg Hx    ??? Strabismus Neg Hx    ??? Macular degeneration Neg Hx    ??? Anesthesia problems Neg Hx    ??? Broken bones Neg Hx    ??? Clotting disorder Neg Hx    ??? Collagen disease Neg Hx    ??? Dislocations Neg Hx    ??? Fibromyalgia Neg Hx    ??? Gout Neg Hx    ??? Hemophilia Neg Hx    ??? Osteoporosis Neg Hx    ??? Rheumatologic disease Neg Hx    ??? Scoliosis Neg Hx    ??? Severe sprains Neg Hx    ??? Sickle cell anemia Neg Hx    ??? Spinal Compression Fracture Neg Hx    ??? Melanoma Neg Hx    ??? Basal cell carcinoma Neg Hx    ??? Squamous cell carcinoma Neg Hx    ??? Deep vein thrombosis Neg Hx         Review of Systems A 10 point review of systems was performed and is negative other than positive elements noted in HPI   Constitutional: Positive for fever.  Eyes: Negative for visual changes.  ENT: Negative for sore throat.  Cardiovascular: No chest pain.  Respiratory:Positive for cough and shortness of breath.  Gastrointestinal: Negative for abdominal pain, vomiting or diarrhea.  Genitourinary: Negative for dysuria.  Musculoskeletal: Positive for muscle aches.  Skin: Negative for rash.  Neurological: Negative for headaches, focal weakness or numbness.    Physical Exam     VITAL SIGNS:    BP 117/67  - Pulse 77  - Temp 37.3 ??C (99.1 ??F) (Oral)  - Resp 24  - Ht 180.3 cm (5' 11)  - Wt (!) 221.8 kg (489 lb)  - SpO2 96%  - BMI 68.20 kg/m??     Constitutional: Alert and oriented.  Slight ill-appearing, speaking in nearly full sentences, mild respiratory distress  Eyes: Conjunctivae are normal.  ENT       Head: Normocephalic and atraumatic.       Nose: No congestion.       Mouth/Throat: Mucous membranes are moist.       Neck: No stridor.  Cardiovascular: Normal rate, regular rhythm.   Respiratory: Slightly increased work of breathing, tachypneic in the low 20s, clear breath sounds  Gastrointestinal: Soft and nontender.  Obese  Genitourinary: No suprapubic tenderness  Musculoskeletal: Normal range of motion in all extremities.   Neurologic: Normal speech and language. No gross focal neurologic deficits are appreciated.  Skin: Skin is warm, dry. No rash noted.  Psychiatric: Mood and affect are normal. Speech and behavior are normal.     Radiology     XR Chest Portable   Preliminary Result      Clear lungs.      Unchanged prominent scattered mediastinal silhouette.        Ecg 12 Lead    Result Date: 04/25/2019  NORMAL SINUS RHYTHM RIGHTWARD AXIS INCOMPLETE  RIGHT BUNDLE BRANCH BLOCK BORDERLINE ECG WHEN COMPARED WITH ECG OF 22-Apr-2019 01:46, NO SIGNIFICANT CHANGE WAS FOUND    Xr Chest Portable    Result Date: 04/25/2019 EXAM: XR CHEST PORTABLE DATE: 04/25/2019 8:59 PM ACCESSION: [id number] UN DICTATED: 04/25/2019 9:12 PM INTERPRETATION LOCATION: [facility] CLINICAL INDICATION: 38 years old Male with CHEST PAIN  COMPARISON: 04/22/2019 TECHNIQUE: Portable Chest Radiograph. FINDINGS: Lungs radiographically clear. No pleural effusion or pneumothorax. Unchanged prominent cardiomediastinal silhouette.     Clear lungs. Unchanged prominent scattered mediastinal silhouette.      Pertinent labs & imaging results that were available during my care of the patient were reviewed by me and considered in my medical decision making (see chart for details).    Please note- This chart has been created using [employer]. Chart creation errors have been sought, but may not always be located and such creation errors, especially pronoun confusion, do NOT reflect on the standard of medical care.    Documentation assistance was provided by Lenn Sink, Scribe, on April 25, 2019 at 7:54 PM for Marveen Reeks, MD.    Documentation assistance was provided by the scribe in my presence.  The documentation recorded by the scribe has been reviewed by me and accurately reflects the services I personally performed.      Marveen Reeks, MD  Resident  04/26/19 [phone]

## 2019-04-26 NOTE — Unmapped (Signed)
88% recorded is in error.

## 2019-05-03 ENCOUNTER — Ambulatory Visit: Payer: Medicaid Other | Admitting: Podiatry

## 2019-05-03 NOTE — Unmapped (Signed)
COVID positive on 11/9. Do you want him to wait until asymptomatic and 14 days past his first day of symptoms to restart his Humira?

## 2019-05-05 ENCOUNTER — Encounter (INDEPENDENT_AMBULATORY_CARE_PROVIDER_SITE_OTHER): Payer: Self-pay

## 2019-05-05 NOTE — Unmapped (Signed)
Thank you. Sent him a note.

## 2019-05-10 ENCOUNTER — Institutional Professional Consult (permissible substitution): Admit: 2019-05-10 | Discharge: 2019-05-11 | Payer: MEDICAID | Attending: Family | Primary: Family

## 2019-05-10 NOTE — Unmapped (Signed)
TeleHealth Phone Encounter  This medical encounter was conducted virtually using [email or web address]  TeleHealth Protocols.    I have identified myself to the patient and conveyed my credentials to Erline Levine  I have explained the capabilities and limitations of telemedicine and the patient and myself both agree that it is appropriate for their current circumstances/symptoms.   In case we get disconnected, patient's phone number is [phone]   Is there someone else in the room? No.   I have informed patient that medical scribe is present during this telephone encounter.     Assessment and Plan:     Cartez was seen today for diabetes.    Diagnoses and all orders for this visit:    Type 2 diabetes mellitus with hyperglycemia, with long-term current use of insulin (CMS-HCC)  HGB A1c 8.5 three months ago. At-home BGs overall at goal by pt report.   Plan to recheck A1c in 1 month (nurse visit).   Continue glipizide 20 mg BID, Lantus 80 units HS, and Humulin as directed with sliding scale.   Reviewed carb controlled diet and encouraged regular exercise.  Refilled glucometer test strips.   Encouraged to reschedule diabetic exam.  -     blood sugar diagnostic Strp; by Other route Three (3) times a day before meals.    COVID-19 virus infection  Patient tested positive 04/19/19. Symptoms have significantly improved.   Advised to continue home measures.    Migraine without status migrainosus, not intractable, unspecified migraine type   -     topiramate (TOPAMAX) 50 MG tablet; Take 1.5 tablets (75 mg total) by mouth Two (2) times a day.    Discussed chicken pox vaccine. Advised vaccine is not indicated due to hx of chicken pox during childhood.    Total time spent with patient:   I spent 18 minutes on the phone with the patient. I spent an additional 5 minutes on pre- and post-visit activities. The patient was physically located in [place] or a state in which I am permitted to provide care. The patient and/or parent/guardian understood that s/he may incur co-pays and cost sharing, and agreed to the telemedicine visit. The visit was reasonable and appropriate under the circumstances given the patient's presentation at the time.    The patient and/or parent/guardian has been advised of the potential risks and limitations of this mode of treatment (including, but not limited to, the absence of in-person examination) and has agreed to be treated using telemedicine. The patient's/patient's family's questions regarding telemedicine have been answered.     If the visit was completed in an ambulatory setting, the patient and/or parent/guardian has also been advised to contact their provider???s office for worsening conditions, and seek emergency medical treatment and/or call 911 if the patient deems either necessary.      HPI:      Logan Quinn  is here for   Chief Complaint   Patient presents with   ??? Diabetes     f/u     Diabetes: Patient presents for follow up of diabetes.  A1C goal is <8.  Diabetes has customarily not been at goal (complicated by: obesity).  Current symptoms include: hypoglycemia : nausea. Symptoms have been intermittent. Patient denies foot ulcerations, hyperglycemia, increased appetite, paresthesia of the feet, polydipsia, polyuria, visual disturbances, vomiting and weight loss. Evaluation to date has included: fasting blood sugar and hemoglobin A1C.  Home sugars: BGs usually <125, 80-90s in morning (fasting), one occasion of low 200s (  non-fasting) last week; one episode of hypoglycemia - glucose 56. Current treatment: Continued insulin which has been effective and Continued sulfonylurea which has been effective.  Doing regular exercise: no. Patient received DexCom machine approx 04/24/19. He notes he was advised to keep AccuCheck glucometer to use as needed, and requests refill of test strips.    Answers for HPI/ROS submitted by the patient on 05/10/2019   Diabetes problem  Diabetes type: type 2  MedicAlert ID: No  Disease duration: 6 years  fatigue: No  foot paresthesias: No  foot ulcerations: No  polyphagia: No  polyuria: No  visual change: No  confusion: No  hunger: No  mood changes: No  pallor: No  sleepiness: No  speech difficulty: No  sweats: No  blackouts: No  hospitalization: No  nocturnal hypoglycemia: No  required assistance: No  required glucagon: No  CVA: No  heart disease: No  impotence: No  nephropathy: No  peripheral neuropathy: No  PVD: No  retinopathy: No  CAD risks: family history, obesity, sedentary lifestyle  Current treatments: diet, insulin injections, oral agent (monotherapy)  Treatment compliance: most of the time  Dose schedule: pre-breakfast, pre-lunch, pre-dinner, at bedtime  Given by: patient  Injection sites: abdominal wall, arms, thighs  Home blood tests: 3-4 x per day  Monitoring compliance: good  Blood glucose trend: decreasing steadily  breakfast time: 6-7 am  breakfast glucose level: 70-90  lunch time: 11-12 pm  lunch glucose level: 110-130  dinner time: 5-6 pm  dinner glucose level: 110-130  High score: 110-130  Overall: 110-130  Current diet: diabetic, low fat/cholesterol  Meal planning: calorie counting, carbohydrate counting  Exercise: intermittently  Dietitian visit: No  Eye exam current: No  Sees podiatrist: Yes    He tested positive for COVID 04/19/19. He reports residual cough and dyspnea, but that he is doing a lot better. SpO2 averaging 95%. Denies fever.    Patient requests PCPs advice regarding if he should receive chicken pox vaccine. He states he had very mild case of chicken pox when he was younger.     HM due:  Diabetic eye exam: cancelled due to COVID dx, needs to reschedule [facility] Components:     Goals     ??? Self- Management Goal        Things to think about to help me reach my goal:     What are you going to do? Increase activity/motivation   How and how much? Clean up and do something constructive   How frequent? 2 x week    Barriers to success? fatigue   Solutions to barriers? Going to sleep earlier          ??? Self- Management Goal        Things to think about to help me reach my goal:     What are you going to do? Continue physical activity    How and how much? Fixing house    How frequent? daily   Barriers to success?    Solutions to barriers?                Medication adherence and barriers to the treatment plan have been addressed. Opportunities to optimize healthy behaviors have been discussed. Patient / caregiver voiced understanding.      Past Medical/Surgical History:     Past Medical History:   Diagnosis Date   ??? Acne    ??? Allergic    ??? Anxiety    ??? Depression    ???  Diabetes mellitus (CMS-HCC)    ??? GERD (gastroesophageal reflux disease)    ??? Gout    ??? Hypertension    ??? IBS (irritable bowel syndrome)    ??? 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 Liver disease    ??? Migraines    ??? Morbid obesity with BMI of 60.0-69.9, adult (CMS-HCC)    ??? 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 Obstructive sleep apnea    ??? OSA on CPAP    ??? Severe obstructive sleep apnea    ??? Trapezius muscle strain 12/07/2013   ??? Urinary incontinence, nocturnal enuresis    ??? Venous insufficiency      Past Surgical History:   Procedure Laterality Date   ??? EYE SURGERY  11/15   ??? PR COLONOSCOPY FLX DX W/COLLJ SPEC WHEN PFRMD N/A 03/24/2013    Procedure: COLONOSCOPY, FLEXIBLE, PROXIMAL TO SPLENIC FLEXURE; DIAGNOSTIC, W/WO COLLECTION SPECIMEN BY BRUSH OR WASH;  Surgeon: Clint Bolder, MD;  [facility];  Service: Gastroenterology   ??? PR EYE SURG POST SGMT PROC UNLISTED Left     pneumatic retinopexy OS   ??? PR UPPER GI ENDOSCOPY,DIAGNOSIS N/A 02/02/2013 Procedure: UGI ENDO, INCLUDE ESOPHAGUS, STOMACH, & DUODENUM &/OR JEJUNUM; DX W/WO COLLECTION SPECIMN, BY BRUSH OR WASH;  Surgeon: Malcolm Metro, MD;  Location: [facility];  Service: Gastroenterology   ??? [place] PYLORIC STENOSIS ([place] HISTORICAL RESULT)         Family History:     Family History   Problem Relation Age of Onset   ??? Cancer Maternal Grandfather    ??? Hearing loss Maternal Grandfather    ??? Cancer Paternal Grandfather    ??? COPD Paternal Grandmother    ??? Arthritis Paternal Grandmother    ??? Depression Paternal Grandmother    ??? Diabetes Mother    ??? Heart disease Mother    ??? Migraines Mother    ??? Arthritis Mother    ??? Depression Mother    ??? GER disease Mother    ??? Hypertension Mother    ??? Angina Mother    ??? COPD Mother    ??? Glaucoma Mother    ??? Diabetes Sister    ??? Migraines Sister    ??? Asthma Sister    ??? Depression Sister    ??? Diabetes Brother    ??? Asthma Brother    ??? Diabetes Maternal Grandmother    ??? Heart disease Maternal Grandmother    ??? Migraines Maternal Grandmother    ??? Depression Maternal Grandmother    ??? Angina Maternal Grandmother    ??? Hypertension Maternal Grandmother    ??? Stroke Maternal Grandmother    ??? Diabetes Maternal Uncle    ??? Diabetes Maternal Uncle    ??? Liver disease Maternal Uncle    ??? Kidney disease Maternal Uncle    ??? Asthma Brother    ??? Colorectal Cancer Neg Hx    ??? Esophageal cancer Neg Hx    ??? Liver cancer Neg Hx    ??? Pancreatic cancer Neg Hx    ??? Stomach cancer Neg Hx    ??? Amblyopia Neg Hx    ??? Blindness Neg Hx    ??? Retinal detachment Neg Hx    ??? Strabismus Neg Hx    ??? Macular degeneration Neg Hx    ??? Anesthesia problems Neg Hx    ??? Broken bones Neg Hx    ??? Clotting disorder Neg Hx    ???  Collagen disease Neg Hx    ??? Dislocations Neg Hx    ??? Fibromyalgia Neg Hx    ??? Gout Neg Hx    ??? Hemophilia Neg Hx    ??? Osteoporosis Neg Hx    ??? Rheumatologic disease Neg Hx    ??? Scoliosis Neg Hx    ??? Severe sprains Neg Hx    ??? Sickle cell anemia Neg Hx ??? Spinal Compression Fracture Neg Hx    ??? Melanoma Neg Hx    ??? Basal cell carcinoma Neg Hx    ??? Squamous cell carcinoma Neg Hx    ??? Deep vein thrombosis Neg Hx        Social History:     Social History     Socioeconomic History   ??? Marital status: Married     Spouse name: None   ??? Number of children: None   ??? Years of education: None   ??? Highest education level: None   Occupational History   ??? None   Social Needs   ??? Financial resource strain: None   ??? Food insecurity     Worry: None     Inability: None   ??? Transportation needs     Medical: None     Non-medical: None   Tobacco Use   ??? Smoking status: Never Smoker   ??? Smokeless tobacco: Never Used   Substance and Sexual Activity   ??? Alcohol use: Never     Alcohol/week: 0.0 standard drinks     Comment: rare social   ??? Drug use: Never   ??? Sexual activity: Yes     Partners: Female     Birth control/protection: None   Lifestyle   ??? Physical activity     Days per week: None     Minutes per session: None   ??? Stress: None   Relationships   ??? Social Wellsite geologist on phone: None     Gets together: None     Attends religious service: None     Active member of club or organization: None     Attends meetings of clubs or organizations: None     Relationship status: None   Other Topics Concern   ??? Do you use sunscreen? Yes   ??? Tanning bed use? No   ??? Are you easily burned? No   ??? Excessive sun exposure? No   ??? Blistering sunburns? No   Social History Narrative   ??? None       Allergies:     Erythromycin, Penicillins, Sulfa (sulfonamide antibiotics), Other, Erythromycin base, and Sulfasalazine    Current Medications:     Current Outpatient Medications   Medication Sig Dispense Refill   ??? ADVAIR DISKUS 500-50 mcg/dose diskus Inhale 1 puff 2 (two) times a day. 13 Inhaler 1   ??? albuterol 2.5 mg /3 mL (0.083 %) nebulizer solution Inhale 3 mL (2.5 mg total) by nebulization every four (4) hours as needed for wheezing. 25 vial 2 ??? albuterol HFA 90 mcg/actuation inhaler Inhale 2 puffs every six (6) hours as needed for wheezing. 3 Inhaler 1   ??? ALPRAZolam (XANAX) 0.5 MG tablet Take 1 tablet (0.5 mg total) by mouth Three (3) times a day. (Patient taking differently: Take 1 mg by mouth Three (3) times a day. Usually takes nightly for anxiety) 60 tablet 0   ??? blood sugar diagnostic Strp by Other route Three (3) times a day before meals. 300 each 1   ???  blood-glucose meter kit Use as directed 1 each 0   ??? cephalexin (KEFLEX) 500 MG capsule Take 1 capsule (500 mg total) by mouth Four (4) times a day. 120 capsule 0   ??? cetirizine (ZYRTEC) 10 MG tablet Take 1 tablet (10 mg total) by mouth nightly as needed for allergies. 90 tablet 1   ??? clindamycin (CLEOCIN T) 1 % external solution Apply topically Two (2) times a day. 60 mL 4   ??? clobetasoL (TEMOVATE) 0.05 % ointment Apply daily to painful affected areas if needed for flares, then stop 15 g 1   ??? cyclobenzaprine (FLEXERIL) 10 MG tablet Take 1 tablet (10 mg total) by mouth Three (3) times a day as needed for muscle spasms. 60 tablet 0   ??? dextroamphetamine-amphetamine (ADDERALL) 20 mg tablet Take 1 tablet by mouth Two (2) times a day.     ??? dicyclomine (BENTYL) 10 mg capsule Take 1 capsule (10 mg total) by mouth Three (3) times a day. 270 capsule 1   ??? diphenoxylate-atropine (LOMOTIL) 2.5-0.025 mg per tablet Take 1 tablet by mouth two (2) times a day as needed for diarrhea. 30 tablet 5   ??? DULoxetine (CYMBALTA) 60 MG capsule Take 60 mg by mouth Two (2) times a day.     ??? empty container (SHARPS CONTAINER) Misc Use as directed to dispose of Humira needles 1 each 2   ??? empty container Misc Use as directed to dispose of Humira injections 1 each 2   ??? erenumab-aooe (AIMOVIG AUTOINJECTOR) 70 mg/mL AtIn Inject 1 mL under the skin every twenty-eight (28) days. 3 Syringe 1   ??? ergocalciferol (DRISDOL) 1,250 mcg (50,000 unit) capsule Take 1 capsule (50,000 Units total) by mouth once a week. 4 capsule 2 ??? famotidine (PEPCID) 20 MG tablet Take 1 tablet (20 mg total) by mouth Two (2) times a day. 180 tablet 1   ??? fluticasone propionate (FLONASE) 50 mcg/actuation nasal spray 1 spray by Each Nare route Two (2) times a day. 3 Bottle 3   ??? glipiZIDE (GLUCOTROL) 10 MG tablet Take 2 tablets (20 mg total) by mouth Two (2) times a day (30 minutes before a meal). 360 tablet 1   ??? HUMIRA PEN CITRATE FREE 40 MG/0.4 ML Inject the contents of 2 pens (80mg  total) under the skin every seven (7) days. 8 each 3   ??? hydrocortisone (ANUSOL-HC) 25 mg suppository Insert 1 suppository (25 mg total) into the rectum Two (2) times a day. 24 each 1   ??? hydrocortisone (PROCTOSOL HC) 2.5 % rectal cream Insert 1 application into the rectum two (2) times a day as needed. 28.35 g 2   ??? ibuprofen (ADVIL,MOTRIN) 800 MG tablet Take 1 tablet (800 mg total) by mouth every eight (8) hours as needed. 90 tablet 1   ??? insulin glargine (LANTUS U-100 INSULIN) 100 unit/mL injection Inject 0.8 mL (80 Units total) under the skin nightly. 72 mL 1   ??? insulin regular (HUMULIN R REGULAR U-100 INSULN) 100 unit/mL injection SLIDING SCALE BLOOD SUGAR 0-150=0 UNITS,150-200=3 UNITS, 200-250=6 UNITS,250-300=9 UNITS, 300-350=12 UNITS 40 mL 1   ??? insulin syringe-needle U-100 (BD INSULIN SYRINGE ULTRA-FINE) 1 mL 31 gauge x 5/16 (8 mm) Syrg USE AS DIRECTED. 100 each 1   ??? ketoconazole (NIZORAL) 2 % cream Apply 1 application topically daily. 15 g 11   ??? lancets Misc 1 each by Miscellaneous route Three (3) times a day before meals. 300 each 1   ??? levothyroxine (SYNTHROID) 50 MCG  tablet Take 1 tablet (50 mcg total) by mouth daily. 90 tablet 1   ??? lidocaine (XYLOCAINE) 5 % ointment lidocaine 5 % topical ointment     ??? miscellaneous medical supply Misc Gauze pads 4 x 4 inches and paper tape 30 each 0   ??? miscellaneous medical supply Misc Nebulizer- Use as directed with albuterol nebs for SOB 1 each 0 ??? MITIGARE 0.6 mg cap capsule Take 1.2 mg on Day 1 of flare, followed by 0.6 mg one hour later. Day 2 take 0.6 mg BID until flare resolves. 60 capsule 3   ??? montelukast (SINGULAIR) 10 mg tablet Take 1 tablet (10 mg total) by mouth daily. 90 each 1   ??? oxybutynin (DITROPAN) 5 MG tablet Take 1 tablet (5 mg total) by mouth Two (2) times a day. 180 tablet 1   ??? polyethylene glycol (GLYCOLAX) 17 gram/dose powder take 17GM (DISSOLVED IN WATER) by mouth once daily     ??? PROAIR HFA 90 mcg/actuation inhaler Inhale 2 puffs every four (4) hours as needed for wheezing. 3 Inhaler 1   ??? promethazine (PHENERGAN) 25 MG tablet Take 1-2 tablets (25-50 mg total) by mouth nightly as needed for nausea. 30 each 2   ??? propranoloL (INDERAL LA) 120 mg 24 hr capsule Take 1 capsule (120 mg total) by mouth daily. 90 capsule 1   ??? traZODone (DESYREL) 100 MG tablet take 2 tablets by mouth at bedtime  0   ??? acetaminophen (TYLENOL) 500 MG tablet Take 2 tablets (1,000 mg total) by mouth Three (3) times a day. 30 tablet 0   ??? topiramate (TOPAMAX) 50 MG tablet Take 1.5 tablets (75 mg total) by mouth Two (2) times a day. 270 tablet 1     No current facility-administered medications for this visit.        Health Maintenance:     Health Maintenance Summary w/Most Recent Date       Status Date      Retinal Eye Exam Overdue 09/18/2018      Done 09/17/2017 SmartData: OPHTH FUNDUS OD PERIPHERY     Done 09/17/2017 SmartData: FINDINGS - PE - EYES - FUNDUSCOPIC - PERIPHERY - RIGHT PERIPHERY NORMAL     Done 09/17/2017 SmartData: OPHTH FUNDUS OS PERIPHERY     Done 09/30/2016 OCT, RETINA - OU - BOTH EYES     Done 09/30/2016 SmartData: OPHTH FUNDUS OD PERIPHERY     Patient has more history with this topic...    Hemoglobin A1c Next Due 05/10/2019      Done 02/08/2019 Registry Metric: Last Hemoglobin A1c Date     Done 02/08/2019 POCT GLYCOSYLATED HEMOGLOBIN (HGB A1C) HGB A1C, RAP/PDS           Done 10/21/2018 POCT GLYCOSYLATED HEMOGLOBIN (HGB A1C) HGB A1C, RAP/PDS Done 07/08/2018 POCT GLYCOSYLATED HEMOGLOBIN (HGB A1C) HGB A1C, RAP/PDS           Done 01/05/2018 POCT GLYCOSYLATED HEMOGLOBIN (HGB A1C) HGB A1C, RAP/PDS           Patient has more history with this topic...    Urine Albumin/Creatinine Ratio Next Due 07/09/2019      Done 07/08/2018 ALBUMIN / CREATININE URINE RATIO Albumin/Creatinine Ratio           Done 06/30/2017 ALBUMIN / CREATININE URINE RATIO Albumin/Creatinine Ratio           Done 05/24/2016 ALBUMIN / CREATININE URINE RATIO Albumin/Creatinine Ratio           Done 03/24/2015 ALBUMIN /  CREATININE URINE RATIO Albumin/Creatinine Ratio           Done 04/05/2013 MICROALBUMIN / CREATININE URINE RATIO Microalbumin/Creatinine Ratio          Foot Exam Next Due 10/21/2019      Done 10/21/2018 [facility] DIABETES FOOT EXAM     Done 10/06/2017 [facility] DIABETES FOOT EXAM     Done 09/13/2016 [facility] DIABETES FOOT EXAM     Done 08/07/2015 [facility] DIABETES FOOT EXAM    Serum Creatinine Monitoring Next Due 04/24/2020      Done 04/25/2019 BASIC METABOLIC PANEL Creatinine           Done 04/21/2019 BASIC METABOLIC PANEL Creatinine           Done 04/07/2018 COMPREHENSIVE METABOLIC PANEL Creatinine           Done 04/20/2017 BASIC METABOLIC PANEL Creatinine           Done 11/10/2016 COMPREHENSIVE METABOLIC PANEL Creatinine           Patient has more history with this topic...    Potassium Monitoring Next Due 04/24/2020      Done 04/25/2019 BASIC METABOLIC PANEL Potassium           Done 04/21/2019 BASIC METABOLIC PANEL Potassium           Done 04/07/2018 COMPREHENSIVE METABOLIC PANEL Potassium           Done 04/20/2017 BASIC METABOLIC PANEL Potassium           Done 11/10/2016 COMPREHENSIVE METABOLIC PANEL Potassium           Patient has more history with this topic...    DTaP/Tdap/Td Vaccines Next Due 07/17/2023      Done 07/16/2013 Imm Admin: TdaP     Done 07/18/2008 Imm Admin: TdaP     Done 04/29/2000 Imm Admin: Tetanus and diptheria,(adult), adsorbed, 2Lf tetanus toxoid, PF Pneumococcal Vaccine This plan is no longer active.      Done 12/30/2012 Imm Admin: PNEUMOCOCCAL POLYSACCHARIDE 23    Hepatitis C Screen This plan is no longer active.      Done 04/29/2018 HEPATITIS C ANTIBODY Hepatitis C Ab           Done 01/01/2013 HEPATITIS C ANTIBODY    Influenza Vaccine This plan is no longer active.      Done 03/31/2019 Imm Admin: INFLUENZA INJ MDCK PF, Quad (Flucelvax)(4y and up Egg Free)     Done 03/25/2019 Imm Admin: Influenza Virus Vaccine, unspecified formulation     Done 02/23/2018 Imm Admin: Influenza Vaccine Quad (IIV4 PF) 51mo+ injectable     Done 02/23/2018 Imm Admin: Influenza Virus Vaccine, unspecified formulation     Done 03/19/2017 Imm Admin: Influenza Vaccine Quad (IIV4 PF) 71mo+ injectable     Patient has more history with this topic...          Immunizations:     Immunization History   Administered Date(s) Administered   ??? Hepatitis B Vaccine, Unspecified Formulation 12/27/1999, 04/29/2000   ??? Hepatitis B, Adult 03/05/2013, 04/05/2013, 09/03/2013   ??? INFLUENZA INJ MDCK PF, Quad (Flucelvax)(4y and up Egg Free) 03/31/2019   ??? INFLUENZA TIV (TRI) PF (IM) 10/08/2011   ??? Influenza Vaccine Quad (IIV4 PF) 30mo+ injectable 03/05/2013, 03/18/2014, 03/22/2015, 03/05/2016, 03/19/2017, 02/23/2018   ??? Influenza Virus Vaccine, unspecified formulation 03/19/2017, 02/23/2018, 03/25/2019   ??? PNEUMOCOCCAL POLYSACCHARIDE 23 12/30/2012   ??? PPD Test 10/28/2016   ??? TdaP 07/18/2008, 07/16/2013   ??? Tetanus and diptheria,(adult), adsorbed, 2Lf tetanus toxoid, PF  04/29/2000       I have reviewed and (if needed) updated the patient's problem list, medications, allergies, past medical and surgical history, social and family history.    ROS:      ROS  Comprehensive 10 point ROS negative unless otherwise stated in the HPI.       Vital Signs:     Wt Readings from Last 3 Encounters:   04/25/19 (!) 221.8 kg (489 lb)   04/21/19 (!) 218.2 kg (481 lb)   03/23/19 (!) 218.2 kg (481 lb) Temp Readings from Last 3 Encounters:   04/26/19 37.2 ??C (99 ??F) (Oral)   04/22/19 37 ??C (98.6 ??F) (Oral)   04/19/19 37.3 ??C (99.1 ??F) (Oral)     BP Readings from Last 3 Encounters:   04/26/19 127/59   04/22/19 146/77   04/19/19 181/79     Pulse Readings from Last 3 Encounters:   04/25/19 77   04/22/19 80   04/19/19 73     Estimated body mass index is 68.2 kg/m?? as calculated from the following:    Height as of 04/25/19: 180.3 cm (5' 11).    Weight as of 04/25/19: 221.8 kg (489 lb).  No height and weight on file for this encounter.        Objective:      As part of this Telephone Visit, no in-person exam was conducted.     I attest that I, Bayard Hugger, personally documented this note while acting as scribe for [name], FNP.      Bayard Hugger, Scribe.  05/10/2019     The documentation recorded by the scribe accurately reflects the service I personally performed and the decisions made by me.    [name], FNP

## 2019-05-11 ENCOUNTER — Encounter (INDEPENDENT_AMBULATORY_CARE_PROVIDER_SITE_OTHER): Payer: Self-pay | Admitting: Nurse Practitioner

## 2019-05-12 NOTE — Unmapped (Signed)
Logan Quinn reports feeling better after his recent covid diagnosis. He still has some lingering cough (mostly dry, occasionally will produce phlegm), but has much more energy now. He held his last 3 doses of Humira (did not resume last week), but will plan to start again tomorrow. He hasn't had any flares since pausing therapy, but has noticed more tenderness of his HS lesions in his groin.     [facility] Clinical Assessment & Refill Coordination Note    Logan Quinn, DOB: 03/31/1981  Phone: [phone] (home)     All above HIPAA information was verified with patient.     Specialty Medication(s):   Inflammatory Disorders: Humira     Current Outpatient Medications   Medication Sig Dispense Refill   ??? acetaminophen (TYLENOL) 500 MG tablet Take 2 tablets (1,000 mg total) by mouth Three (3) times a day. 30 tablet 0   ??? ADVAIR DISKUS 500-50 mcg/dose diskus Inhale 1 puff 2 (two) times a day. 13 Inhaler 1   ??? albuterol 2.5 mg /3 mL (0.083 %) nebulizer solution Inhale 3 mL (2.5 mg total) by nebulization every four (4) hours as needed for wheezing. 25 vial 2   ??? albuterol HFA 90 mcg/actuation inhaler Inhale 2 puffs every six (6) hours as needed for wheezing. 3 Inhaler 1   ??? ALPRAZolam (XANAX) 0.5 MG tablet Take 1 tablet (0.5 mg total) by mouth Three (3) times a day. (Patient taking differently: Take 1 mg by mouth Three (3) times a day. Usually takes nightly for anxiety) 60 tablet 0   ??? blood sugar diagnostic Strp by Other route Three (3) times a day before meals. 300 each 1   ??? blood-glucose meter kit Use as directed 1 each 0   ??? cephalexin (KEFLEX) 500 MG capsule Take 1 capsule (500 mg total) by mouth Four (4) times a day. 120 capsule 0   ??? cetirizine (ZYRTEC) 10 MG tablet Take 1 tablet (10 mg total) by mouth nightly as needed for allergies. 90 tablet 1   ??? clindamycin (CLEOCIN T) 1 % external solution Apply topically Two (2) times a day. 60 mL 4 ??? clobetasoL (TEMOVATE) 0.05 % ointment Apply daily to painful affected areas if needed for flares, then stop 15 g 1   ??? cyclobenzaprine (FLEXERIL) 10 MG tablet Take 1 tablet (10 mg total) by mouth Three (3) times a day as needed for muscle spasms. 60 tablet 0   ??? dextroamphetamine-amphetamine (ADDERALL) 20 mg tablet Take 1 tablet by mouth Two (2) times a day.     ??? dicyclomine (BENTYL) 10 mg capsule Take 1 capsule (10 mg total) by mouth Three (3) times a day. 270 capsule 1   ??? diphenoxylate-atropine (LOMOTIL) 2.5-0.025 mg per tablet Take 1 tablet by mouth two (2) times a day as needed for diarrhea. 30 tablet 5   ??? DULoxetine (CYMBALTA) 60 MG capsule Take 60 mg by mouth Two (2) times a day.     ??? empty container (SHARPS CONTAINER) Misc Use as directed to dispose of Humira needles 1 each 2   ??? empty container Misc Use as directed to dispose of Humira injections 1 each 2   ??? erenumab-aooe (AIMOVIG AUTOINJECTOR) 70 mg/mL AtIn Inject 1 mL under the skin every twenty-eight (28) days. 3 Syringe 1   ??? ergocalciferol (DRISDOL) 1,250 mcg (50,000 unit) capsule Take 1 capsule (50,000 Units total) by mouth once a week. 4 capsule 2   ??? famotidine (PEPCID) 20 MG tablet  Take 1 tablet (20 mg total) by mouth Two (2) times a day. 180 tablet 1   ??? fluticasone propionate (FLONASE) 50 mcg/actuation nasal spray 1 spray by Each Nare route Two (2) times a day. 3 Bottle 3   ??? glipiZIDE (GLUCOTROL) 10 MG tablet Take 2 tablets (20 mg total) by mouth Two (2) times a day (30 minutes before a meal). 360 tablet 1   ??? HUMIRA PEN CITRATE FREE 40 MG/0.4 ML Inject the contents of 2 pens (80mg  total) under the skin every seven (7) days. 8 each 3   ??? hydrocortisone (PROCTOSOL HC) 2.5 % rectal cream Insert 1 application into the rectum two (2) times a day as needed. 28.35 g 2   ??? ibuprofen (ADVIL,MOTRIN) 800 MG tablet Take 1 tablet (800 mg total) by mouth every eight (8) hours as needed. 90 tablet 1 ??? insulin glargine (LANTUS U-100 INSULIN) 100 unit/mL injection Inject 0.8 mL (80 Units total) under the skin nightly. 72 mL 1   ??? insulin regular (HUMULIN R REGULAR U-100 INSULN) 100 unit/mL injection SLIDING SCALE BLOOD SUGAR 0-150=0 UNITS,150-200=3 UNITS, 200-250=6 UNITS,250-300=9 UNITS, 300-350=12 UNITS 40 mL 1   ??? insulin syringe-needle U-100 (BD INSULIN SYRINGE ULTRA-FINE) 1 mL 31 gauge x 5/16 (8 mm) Syrg USE AS DIRECTED. 100 each 1   ??? ketoconazole (NIZORAL) 2 % cream Apply 1 application topically daily. 15 g 11   ??? lancets Misc 1 each by Miscellaneous route Three (3) times a day before meals. 300 each 1   ??? levothyroxine (SYNTHROID) 50 MCG tablet Take 1 tablet (50 mcg total) by mouth daily. 90 tablet 1   ??? lidocaine (XYLOCAINE) 5 % ointment lidocaine 5 % topical ointment     ??? miscellaneous medical supply Misc Gauze pads 4 x 4 inches and paper tape 30 each 0   ??? miscellaneous medical supply Misc Nebulizer- Use as directed with albuterol nebs for SOB 1 each 0   ??? MITIGARE 0.6 mg cap capsule Take 1.2 mg on Day 1 of flare, followed by 0.6 mg one hour later. Day 2 take 0.6 mg BID until flare resolves. 60 capsule 3   ??? montelukast (SINGULAIR) 10 mg tablet Take 1 tablet (10 mg total) by mouth daily. 90 each 1   ??? oxybutynin (DITROPAN) 5 MG tablet Take 1 tablet (5 mg total) by mouth Two (2) times a day. 180 tablet 1   ??? polyethylene glycol (GLYCOLAX) 17 gram/dose powder take 17GM (DISSOLVED IN WATER) by mouth once daily     ??? PROAIR HFA 90 mcg/actuation inhaler Inhale 2 puffs every four (4) hours as needed for wheezing. 3 Inhaler 1   ??? promethazine (PHENERGAN) 25 MG tablet Take 1-2 tablets (25-50 mg total) by mouth nightly as needed for nausea. 30 each 2   ??? propranoloL (INDERAL LA) 120 mg 24 hr capsule Take 1 capsule (120 mg total) by mouth daily. 90 capsule 1   ??? topiramate (TOPAMAX) 50 MG tablet Take 1.5 tablets (75 mg total) by mouth Two (2) times a day. 270 tablet 1 ??? traZODone (DESYREL) 100 MG tablet take 2 tablets by mouth at bedtime  0     No current facility-administered medications for this visit.         Changes to medications: Koi reports no changes at this time.    Allergies   Allergen Reactions   ??? Erythromycin Nausea And Vomiting     Other reaction(s): Vomiting   ??? Penicillins Rash, Swelling and Hives   ???  Sulfa (Sulfonamide Antibiotics) Nausea And Vomiting and Nausea Only   ??? Other    ??? Erythromycin Base Rash   ??? Sulfasalazine Nausea And Vomiting       Changes to allergies: No    SPECIALTY MEDICATION ADHERENCE     Humira 40 mg/ml: 2 doses remaining     Specialty medication(s) dose(s) confirmed: Regimen is correct and unchanged.     Are there any concerns with adherence? No    Adherence counseling provided? Not needed    CLINICAL MANAGEMENT AND INTERVENTION      Clinical Benefit Assessment:    Do you feel the medicine is effective or helping your condition? Yes  Appointment with provider is scheduled for 12/04/18    Clinical Benefit counseling provided? Not needed    Adverse Effects Assessment:    Are you experiencing any side effects? No    Are you experiencing difficulty administering your medicine? No    Quality of Life Assessment:    How many days over the past month did your hidradenitis suppurativa  keep you from your normal activities? For example, brushing your teeth or getting up in the morning. 0    Have you discussed this with your provider? Not needed    Therapy Appropriateness:    Is therapy appropriate? Yes, therapy is appropriate and should be continued    DISEASE/MEDICATION-SPECIFIC INFORMATION      For patients on injectable medications: Patient currently has 2 doses left.  Next injection is scheduled for tomorrow.    PATIENT SPECIFIC NEEDS     ? Does the patient have any physical, cognitive, or cultural barriers? No    ? Is the patient high risk? No     ? Does the patient require a Care Management Plan? No ? Does the patient require physician intervention or other additional services (i.e. nutrition, smoking cessation, social work)? No      SHIPPING     Specialty Medication(s) to be Shipped:   Inflammatory Disorders: Humira    Other medication(s) to be shipped: sharps container     Changes to insurance: No    Delivery Scheduled: Yes, Expected medication delivery date: Wed, Dec 9.     Medication will be delivered via Same Day Courier to the confirmed prescription address in [facility].    The patient will receive a drug information handout for each medication shipped and additional FDA Medication Guides as required.  Verified that patient has previously received a Conservation officer, historic buildings.    All of the patient's questions and concerns have been addressed.    Lanney Gins   [facility] Specialty Pharmacist

## 2019-05-13 NOTE — Unmapped (Signed)
Data reviewed

## 2019-05-14 NOTE — Unmapped (Signed)
Patient is requesting a delivery for earlier in the week due to miscounting his stock on hand. Patient has agreed to a delivery on 12/07.

## 2019-05-17 ENCOUNTER — Encounter: Payer: Self-pay | Admitting: Podiatry

## 2019-05-17 ENCOUNTER — Ambulatory Visit (INDEPENDENT_AMBULATORY_CARE_PROVIDER_SITE_OTHER): Payer: Medicaid Other | Admitting: Podiatry

## 2019-05-17 DIAGNOSIS — B351 Tinea unguium: Secondary | ICD-10-CM

## 2019-05-17 DIAGNOSIS — E1142 Type 2 diabetes mellitus with diabetic polyneuropathy: Secondary | ICD-10-CM

## 2019-05-17 DIAGNOSIS — M79676 Pain in unspecified toe(s): Secondary | ICD-10-CM | POA: Diagnosis not present

## 2019-05-17 MED ORDER — INSULIN SYRINGE U-100 WITH NEEDLE 1 ML 31 GAUGE X 5/16" (8 MM)
1 refills | 0 days | Status: CP
Start: 2019-05-17 — End: 2020-05-16

## 2019-05-17 MED FILL — HUMIRA PEN CITRATE FREE 40 MG/0.4 ML: 28 days supply | Qty: 8 | Fill #1

## 2019-05-17 MED FILL — HUMIRA PEN CITRATE FREE 40 MG/0.4 ML: 28 days supply | Qty: 8 | Fill #1 | Status: AC

## 2019-05-17 MED FILL — EMPTY CONTAINER: 60 days supply | Qty: 1 | Fill #1 | Status: AC

## 2019-05-17 MED FILL — EMPTY CONTAINER: 60 days supply | Qty: 1 | Fill #1

## 2019-05-17 NOTE — Progress Notes (Signed)
Complaint:  Visit Type: Patient returns to my office for continued preventative foot care services. Complaint: Patient states" my nails have grown long and thick and become painful to walk and wear shoes" Patient has been diagnosed with DM with no foot complications. The patient presents for preventative foot care services. No changes to ROS  Podiatric Exam: Vascular: dorsalis pedis and posterior tibial pulses are palpable bilateral. Capillary return is immediate. Temperature gradient is WNL. Skin turgor WNL  Sensorium: Normal Semmes Weinstein monofilament test. Normal tactile sensation bilaterally. Nail Exam: Pt has thick disfigured discolored nails with subungual debris noted bilateral entire nail hallux through fifth toenails Ulcer Exam: There is no evidence of ulcer or pre-ulcerative changes or infection. Orthopedic Exam: Muscle tone and strength are WNL. No limitations in general ROM. No crepitus or effusions noted. Foot type and digits show no abnormalities. Bony prominences are unremarkable. Skin: No Porokeratosis. No infection or ulcers  Diagnosis:  Onychomycosis, , Pain in right toe, pain in left toes  Treatment & Plan Procedures and Treatment: Consent by patient was obtained for treatment procedures.   Debridement of mycotic and hypertrophic toenails, 1 through 5 bilateral and clearing of subungual debris. No ulceration, no infection noted.  Return Visit-Office Procedure: Patient instructed to return to the office for a follow up visit 4 months for continued evaluation and treatment.    Jocob Dambach DPM 

## 2019-05-19 MED ORDER — INSULIN SYRINGE U-100 WITH NEEDLE 1 ML 31 GAUGE X 5/16" (8 MM)
1 refills | 0 days | Status: CP
Start: 2019-05-19 — End: 2020-05-18

## 2019-05-20 ENCOUNTER — Other Ambulatory Visit: Admit: 2019-05-20 | Discharge: 2019-05-21 | Payer: MEDICAID

## 2019-05-20 DIAGNOSIS — E1165 Type 2 diabetes mellitus with hyperglycemia: Principal | ICD-10-CM

## 2019-05-20 DIAGNOSIS — Z794 Long term (current) use of insulin: Principal | ICD-10-CM

## 2019-05-26 ENCOUNTER — Ambulatory Visit: Payer: Medicaid Other | Admitting: Podiatry

## 2019-06-01 DIAGNOSIS — L732 Hidradenitis suppurativa: Principal | ICD-10-CM

## 2019-06-01 MED ORDER — CEPHALEXIN 500 MG CAPSULE
ORAL_CAPSULE | Freq: Four times a day (QID) | ORAL | 0 refills | 10.00000 days | Status: CP
Start: 2019-06-01 — End: ?

## 2019-06-01 NOTE — Unmapped (Signed)
Last seen November 2020. Requesting keflex refill. Pended a 10 day supply if appropriate.

## 2019-06-01 NOTE — Unmapped (Signed)
[facility] Refill Coordination Note    Specialty Medication(s) to be Shipped:   Inflammatory Disorders: Humira    Other medication(s) to be shipped: n/a     Logan Quinn, DOB: May 11, 1981  Phone: [phone] (home)       All above HIPAA information was verified with patient.     Was a Nurse, learning disability used for this call? No    Completed refill call assessment today to schedule patient's medication shipment from the [facility] [phone]).       Specialty medication(s) and dose(s) confirmed: Regimen is correct and unchanged.   Changes to medications: Logan Quinn reports no changes at this time.  Changes to insurance: No  Questions for the pharmacist: No    Confirmed patient received Welcome Packet with first shipment. The patient will receive a drug information handout for each medication shipped and additional FDA Medication Guides as required.       DISEASE/MEDICATION-SPECIFIC INFORMATION        For patients on injectable medications: Patient currently has 2 doses left.  Next injection is scheduled for 06/03/2019.    SPECIALTY MEDICATION ADHERENCE     Medication Adherence    Patient reported X missed doses in the last month: 0  Specialty Medication: humira cf 40 mg/0.4 ml  Patient is on additional specialty medications: No  Any gaps in refill history greater than 2 weeks in the last 3 months: no  Demonstrates understanding of importance of adherence: yes  Informant: patient  Reliability of informant: reliable  Confirmed plan for next specialty medication refill: delivery by pharmacy  Refills needed for supportive medications: not needed                humira cf 40 mg/0.4 ml. 14 days on hand      SHIPPING     Shipping address confirmed in Epic.     Delivery Scheduled: Yes, Expected medication delivery date: 06/09/2019.     Medication will be delivered via Same Day Courier to the prescription address in Epic [facility].    Hyden Soley D Ronit Marczak [facility] Shared [facility] Specialty Technician

## 2019-06-02 ENCOUNTER — Ambulatory Visit: Payer: Medicaid Other | Admitting: Podiatry

## 2019-06-07 NOTE — Unmapped (Signed)
Overall numbers show good control.

## 2019-06-09 MED FILL — HUMIRA PEN CITRATE FREE 40 MG/0.4 ML: 28 days supply | Qty: 8 | Fill #2 | Status: AC

## 2019-06-09 MED FILL — HUMIRA PEN CITRATE FREE 40 MG/0.4 ML: 28 days supply | Qty: 8 | Fill #2

## 2019-06-10 ENCOUNTER — Ambulatory Visit: Admit: 2019-06-10 | Discharge: 2019-06-11 | Payer: MEDICAID

## 2019-06-10 DIAGNOSIS — H3322 Serous retinal detachment, left eye: Principal | ICD-10-CM

## 2019-06-10 DIAGNOSIS — Q141 Congenital malformation of retina: Principal | ICD-10-CM

## 2019-06-10 DIAGNOSIS — E119 Type 2 diabetes mellitus without complications: Principal | ICD-10-CM

## 2019-06-10 NOTE — Unmapped (Signed)
1. 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  The patient was advised to maintain tight glucose control, tight blood pressure control, and favorable levels of cholesterol.  Follow up in one year was recommended.    2. CHRPE OS  Incidental finding. No risk. Monitor.    3. H/o mac off retinal detachment OS  Cryo/ pneumatic 04/12/13. History of mild residual loculated subretinal fluid in macular -likely etiology of pigmentary changes  Flat and attached. Educated about F/F/shadows. Monitor.    RTC 1 year  Cc [name], FNP     INTERPRETATION EXTENDED OPHTHALMOSCOPY (20D with scleral depression)  Retinal Periphery - right:  retina flat and attached, no signs of DR  Retinal Periphery - left:  Well treated scar  at 11 o'clock,  at 3 o'clock flat pigmented plaque consistent with CHRPE adjacent to ora, 1500 microns in size,  pigmented demarcation line in superior mid periphery

## 2019-06-16 ENCOUNTER — Ambulatory Visit: Admit: 2019-06-16 | Discharge: 2019-06-16 | Discharge status: Against Medical Advice | Payer: MEDICAID

## 2019-06-16 ENCOUNTER — Ambulatory Visit: Admit: 2019-06-16 | Discharge: 2019-06-17 | Payer: MEDICAID | Attending: Dermatology | Primary: Dermatology

## 2019-06-16 DIAGNOSIS — Z79899 Other long term (current) drug therapy: Principal | ICD-10-CM

## 2019-06-16 DIAGNOSIS — L732 Hidradenitis suppurativa: Principal | ICD-10-CM

## 2019-06-16 DIAGNOSIS — R52 Pain, unspecified: Principal | ICD-10-CM

## 2019-06-16 MED ORDER — OXYCODONE-ACETAMINOPHEN 5 MG-325 MG TABLET
ORAL_TABLET | Freq: Four times a day (QID) | ORAL | 0 refills | 3.00000 days | Status: CP | PRN
Start: 2019-06-16 — End: 2019-06-19

## 2019-06-16 NOTE — Unmapped (Signed)
Pt reports rt groin skin irritation seen here for this before

## 2019-06-16 NOTE — Unmapped (Signed)
Dermatology After Hours Telephone Note    Returned after hours page from patient -- 39yo M w/hx HS (hurley II) on Humira and keflex, s/p unroofing of R groin in 03/2019.    He reports that he has been having a bit of a flare and HS lesions have been bleeding profusely since this morning. He has been unable to get the bleeding stopped at home with pressure. He cannot estimate the amount of bleeding but states that he is using wet wipes to help protect his clothing and has gone through quite a few. He does not have any absorbent pads. He is concerned because this has not happened before and he feels like the bleeding is more profuse even than when he had his surgery. He is also having some associated pain in the area. He is otherwise well, denies feeling faint or dizzy.    Discussed that the safest course of action would be to go to urgent care or the ED so that someone can help get the bleeding stopped and ensure that there are no concerning sequelae that need additional workup or treatment. He is amenable to this and states that he will likely go to the [facility] ED.     He currently does not have a follow up appointment scheduled with dermatology. If outpatient follow up would be of benefit, we would be happy to see him. Our schedulers can be reached at [phone].    Tawny Asal, MD  06/15/19  8:44 PM

## 2019-06-16 NOTE — Unmapped (Signed)
Assessment and Plan:    Hidradenitis suppurativa, Hurley stage II, currently flaring  - chronic illness with exacerbation   - s/p unroofing of right scrotum, with new flare just proximal to surgical site   - Continue humira 80mg  weekly R/B/A reviewed including but not limited to risk infection.   - Continue keflex 500mg  QID, discussed we may try to come off of this in the near future   - continue clindamycin lotion topically nightly to the flaring lesions  - Consider remicade infusion given flares on 80mg  weekly of humira. R/B/A reviewed including but not limited to risk infection, infusion site reaction. He declines at this time.   - rx for 10 tablets of oxycodone 5-325 mg provided today due to acute pain    Pain - secondary to inflammation from HS - acute un complicated illness or injury   - repeat ILK today to inflamed nodule on right scrotum   - oxyCODONE-acetaminophen (PERCOCET) 5-325 mg per tablet; Take 1 tablet by mouth every six (6) hours as needed for pain for up to 3 days. SEDATING MEDICATION  R/B/A reviewed including but not limited to sedation.   - triamcinolone acetonide (KENALOG-40) injection 40 mg    High risk medication use, Humira  - check quant gold check today   - hepatitis serologies reviewed C, B sAb, B sAg, cAb 04/2018     After the patient was informed of risks, benefits and side effects of intralesional steroid injection, the patient elected to undergo injection. Informed verbal consent was obtained. Risk of atrophy  and dyspigmentation with injection was explained. Kenalog 40 mg/ml was injected locally into the sites located R groin in a clean fashion following alcohol prep.   Total volume in ml=0.4cc.  Number of sites treated: 1   Wound care was explained to the patient    Problem Complexity - Moderate   Data Reviewed - Moderate   Risk of management - Moderate        RTC: 3 months HS     Chief Complaint: Follow-up hidradenitis suppurativa HPI: This is a pleasant 39 y.o. male last seen by Dr. Elder Cyphers on 04/14/2019. He returns for follow-up of HS. Reportedly went to [facility] ED yesterday due to bleeding in the groin. He is here with his wife. He underwent unroofing in that area in 03/2019. He thinks the site from his unroofing has healed well but he has new areas of involvement above that. Continues Humira 80 mg weekly, keflex 500 mg QID, clindamycin topically. He has no other new flares besides the area on his right groin today.    No other skin concerns today.    Past Medical History:   - No history of skin cancer  - Insulin dependent diabetes mellitus    Family History:  No history of melanoma    Social History:  Lives in [place], [place]    Review of Systems: No fevers or chills. No other skin concerns unless otherwise noted in the HPI.    Physical Examination:  General: Well-developed, well-nourished male in no acute distress, resting comfortably.  Neuro: A, A, Ox 3. Answers questions appropriately.  Skin: Per patient request, focused exam of abdomen and groin performed and notable for:  - well healing scar at unroofing site on R groin   - two draining inflamed nodules in R groin proximal  -All other areas examined were normal or had no significant findings  ______________________________________________________________________  The patient was seen and examined by Venia Carbon,  MD who agrees with the assessment and plan as above.

## 2019-06-16 NOTE — Unmapped (Addendum)
Wound seal at drugstore can help stop bleeding if this happens again.

## 2019-06-17 ENCOUNTER — Encounter (INDEPENDENT_AMBULATORY_CARE_PROVIDER_SITE_OTHER): Payer: Self-pay | Admitting: Vascular Surgery

## 2019-06-17 ENCOUNTER — Ambulatory Visit (INDEPENDENT_AMBULATORY_CARE_PROVIDER_SITE_OTHER): Payer: Medicaid Other | Admitting: Vascular Surgery

## 2019-06-17 ENCOUNTER — Other Ambulatory Visit: Payer: Self-pay

## 2019-06-17 VITALS — BP 150/70 | HR 66 | Resp 16 | Wt >= 6400 oz

## 2019-06-17 DIAGNOSIS — I83811 Varicose veins of right lower extremities with pain: Secondary | ICD-10-CM | POA: Diagnosis not present

## 2019-06-17 DIAGNOSIS — I83813 Varicose veins of bilateral lower extremities with pain: Secondary | ICD-10-CM

## 2019-06-17 NOTE — Progress Notes (Signed)
   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  Procedure:  Sclerotherapy using both ultrasound-guided foam as well as hypertonic saline mixed with 1% Lidocaine was performed on the right lower extremity.  Compression wraps were placed.  The patient tolerated the procedure well.  Plan:  Follow up as needed.

## 2019-06-18 LAB — QUANTIFERON TB GOLD PLUS
QUANTIFERON ANTIGEN 1 MINUS NIL: 0.04 [IU]/mL
QUANTIFERON MITOGEN: 9.94 [IU]/mL
QUANTIFERON TB GOLD PLUS: NEGATIVE
QUANTIFERON TB NIL VALUE: 0.06 [IU]/mL

## 2019-06-18 LAB — TB AG1 VALUE: Lab: 0.1

## 2019-06-18 LAB — TB AG2 VALUE: Lab: 0.09

## 2019-06-18 LAB — TB NIL VALUE: Lab: 0.06

## 2019-06-18 LAB — QUANTIFERON TB NIL VALUE: Lab: 0.06

## 2019-06-18 LAB — TB MITOGEN VALUE: Lab: 10

## 2019-06-30 MED ORDER — CYCLOBENZAPRINE 10 MG TABLET
ORAL_TABLET | Freq: Three times a day (TID) | ORAL | 0 refills | 20 days | Status: CP | PRN
Start: 2019-06-30 — End: 2020-06-29

## 2019-07-01 ENCOUNTER — Ambulatory Visit: Admit: 2019-07-01 | Discharge: 2019-07-01 | Disposition: A | Discharge status: Home / Self Care | Payer: MEDICAID

## 2019-07-01 ENCOUNTER — Emergency Department: Admit: 2019-07-01 | Discharge: 2019-07-01 | Disposition: A | Discharge status: Home / Self Care | Payer: MEDICAID

## 2019-07-01 DIAGNOSIS — M25579 Pain in unspecified ankle and joints of unspecified foot: Principal | ICD-10-CM

## 2019-07-01 DIAGNOSIS — M79672 Pain in left foot: Principal | ICD-10-CM

## 2019-07-01 NOTE — Unmapped (Signed)
Pt in w/c, left ankle is swelling, pain onset Tuesday night, unsure of specific injury, may have twisted it going down steps Tuesday night, has brace in place

## 2019-07-01 NOTE — Unmapped (Signed)
[facility] Emergency Department Provider Note    ED Clinical Impression     Final diagnoses:   Ankle pain, unspecified chronicity, unspecified laterality (Primary)         Initial Impression, ED Course, Assessment and Plan     This is a 39 year old male with a history of morbid obesity, diabetes, hypertension, gout who presents with left ankle and foot pain after a misstep.  Patient reports swelling over the lateral malleolus.  Pain is worse with weightbearing.  Some relief with Tylenol and ibuprofen.  Vital signs are reassuring.  On physical exam, the patient is in no acute distress.  He is edema and tenderness over the left lateral malleolus and tenderness over the midfoot.  No tenderness over the base of the fifth metatarsal.  No gross deformities, ligaments stable, neurovascularly intact.  X-rays were negative for fracture and no evidence of Lisfranc's injury on weightbearing foot x-ray.  Unable to place 3D boot due to patient's body habitus.  Patient has a lace up brace.  He was offered crutches but and is unable to use them and has a cane already.  Discharged with orthopedics follow-up as needed.  Usual customary return precautions given.      I independently visualized the radiology images.   I reviewed the patient's prior medical records.   I obtained additional history from his wife.       ____________________________________________    Time seen: July 01, 2019 3:17 AM       History     Chief Complaint  Ankle Pain      HPI Logan Quinn is a 39 y.o. male with a history of morbid obesity, diabetes, hypertension, gout who presents with left ankle and foot pain after a misstep.  Per the patient, he walked downstairs and may have twisted his foot and ankle several days ago.  Since then, he has had pain in his left lateral ankle and left midfoot.  He also reports swelling over the left ankle and mild bruising that is since resolved.  He has been taking Tylenol and ibuprofen with moderate transient relief.  He has not applied ice or elevated the extremity.  He has been using a lace up brace.  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  No other injuries.      Past Medical History:   Diagnosis Date   ??? Acne    ??? Allergic    ??? Anxiety    ??? Depression    ??? Diabetes mellitus (CMS-HCC)    ??? GERD (gastroesophageal reflux disease)    ??? Gout    ??? Hypertension    ??? IBS (irritable bowel syndrome)    ??? 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 Liver disease    ??? Migraines    ??? Morbid obesity with BMI of 60.0-69.9, adult (CMS-HCC)    ??? 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 Obstructive sleep apnea    ??? OSA on CPAP    ??? Severe obstructive sleep apnea    ??? Trapezius muscle strain 12/07/2013   ??? Urinary incontinence, nocturnal enuresis    ??? Venous insufficiency        Past Surgical History:   Procedure Laterality Date   ??? EYE SURGERY  11/15   ??? PR COLONOSCOPY FLX DX W/COLLJ SPEC WHEN PFRMD N/A 03/24/2013    Procedure: COLONOSCOPY, FLEXIBLE, PROXIMAL TO SPLENIC FLEXURE; DIAGNOSTIC, W/WO COLLECTION  SPECIMEN BY BRUSH OR WASH;  Surgeon: Clint Bolder, MD;  [facility];  Service: Gastroenterology   ??? PR EYE SURG POST SGMT PROC UNLISTED Left     pneumatic retinopexy OS   ??? PR UPPER GI ENDOSCOPY,DIAGNOSIS N/A 02/02/2013 Procedure: UGI ENDO, INCLUDE ESOPHAGUS, STOMACH, & DUODENUM &/OR JEJUNUM; DX W/WO COLLECTION SPECIMN, BY BRUSH OR WASH;  Surgeon: Malcolm Metro, MD;  Location: [facility];  Service: Gastroenterology   ??? [place] PYLORIC STENOSIS ([place] HISTORICAL RESULT)           Current Facility-Administered Medications:   ???  oxyCODONE (ROXICODONE) immediate release tablet 5 mg, 5 mg, Oral, Once, Mariabelen Pressly Burman Riis, MD    Current Outpatient Medications:   ???  acetaminophen (TYLENOL) 500 MG tablet, Take 2 tablets (1,000 mg total) by mouth Three (3) times a day., Disp: 30 tablet, Rfl: 0  ???  ADVAIR DISKUS 500-50 mcg/dose diskus, Inhale 1 puff 2 (two) times a day., Disp: 13 Inhaler, Rfl: 1  ???  albuterol 2.5 mg /3 mL (0.083 %) nebulizer solution, Inhale 3 mL (2.5 mg total) by nebulization every four (4) hours as needed for wheezing., Disp: 25 vial, Rfl: 2  ???  albuterol HFA 90 mcg/actuation inhaler, Inhale 2 puffs every six (6) hours as needed for wheezing., Disp: 3 Inhaler, Rfl: 1  ???  ALPRAZolam (XANAX) 0.5 MG tablet, Take 1 tablet (0.5 mg total) by mouth Three (3) times a day. (Patient taking differently: Take 1 mg by mouth Three (3) times a day. Usually takes nightly for anxiety), Disp: 60 tablet, Rfl: 0  ???  blood sugar diagnostic Strp, by Other route Three (3) times a day before meals., Disp: 300 each, Rfl: 1  ???  blood-glucose meter kit, Use as directed, Disp: 1 each, Rfl: 0  ???  cephalexin (KEFLEX) 500 MG capsule, Take 1 capsule (500 mg total) by mouth Four (4) times a day., Disp: 40 capsule, Rfl: 0  ???  cetirizine (ZYRTEC) 10 MG tablet, Take 1 tablet (10 mg total) by mouth nightly as needed for allergies., Disp: 90 tablet, Rfl: 1  ???  clindamycin (CLEOCIN T) 1 % external solution, Apply topically Two (2) times a day., Disp: 60 mL, Rfl: 4  ???  clobetasoL (TEMOVATE) 0.05 % ointment, Apply daily to painful affected areas if needed for flares, then stop, Disp: 15 g, Rfl: 1 ???  cyclobenzaprine (FLEXERIL) 10 MG tablet, Take 1 tablet (10 mg total) by mouth Three (3) times a day as needed for muscle spasms., Disp: 60 tablet, Rfl: 0  ???  dextroamphetamine-amphetamine (ADDERALL) 20 mg tablet, Take 1 tablet by mouth Two (2) times a day., Disp: , Rfl:   ???  dicyclomine (BENTYL) 10 mg capsule, Take 1 capsule (10 mg total) by mouth Three (3) times a day., Disp: 270 capsule, Rfl: 1  ???  diphenoxylate-atropine (LOMOTIL) 2.5-0.025 mg per tablet, Take 1 tablet by mouth two (2) times a day as needed for diarrhea., Disp: 30 tablet, Rfl: 5  ???  DULoxetine (CYMBALTA) 60 MG capsule, Take 60 mg by mouth Two (2) times a day., Disp: , Rfl:   ???  empty container (SHARPS CONTAINER) Misc, Use as directed to dispose of Humira needles, Disp: 1 each, Rfl: 2  ???  empty container Misc, Use as directed to dispose of Humira injections, Disp: 1 each, Rfl: 2  ???  erenumab-aooe (AIMOVIG AUTOINJECTOR) 70 mg/mL AtIn, Inject 1 mL under the skin every twenty-eight (28) days., Disp: 3  Syringe, Rfl: 1  ???  ergocalciferol (DRISDOL) 1,250 mcg (50,000 unit) capsule, Take 1 capsule (50,000 Units total) by mouth once a week., Disp: 4 capsule, Rfl: 2  ???  famotidine (PEPCID) 20 MG tablet, Take 1 tablet (20 mg total) by mouth Two (2) times a day., Disp: 180 tablet, Rfl: 1  ???  fluticasone propionate (FLONASE) 50 mcg/actuation nasal spray, 1 spray by Each Nare route Two (2) times a day., Disp: 3 Bottle, Rfl: 3  ???  glipiZIDE (GLUCOTROL) 10 MG tablet, Take 2 tablets (20 mg total) by mouth Two (2) times a day (30 minutes before a meal)., Disp: 360 tablet, Rfl: 1  ???  HUMIRA PEN CITRATE FREE 40 MG/0.4 ML, Inject the contents of 2 pens (80mg  total) under the skin every seven (7) days., Disp: 8 each, Rfl: 3  ???  hydrocortisone (PROCTOSOL HC) 2.5 % rectal cream, Insert 1 application into the rectum two (2) times a day as needed., Disp: 28.35 g, Rfl: 2 ???  ibuprofen (ADVIL,MOTRIN) 800 MG tablet, Take 1 tablet (800 mg total) by mouth every eight (8) hours as needed., Disp: 90 tablet, Rfl: 1  ???  insulin glargine (LANTUS U-100 INSULIN) 100 unit/mL injection, Inject 0.8 mL (80 Units total) under the skin nightly., Disp: 72 mL, Rfl: 1  ???  insulin regular (HUMULIN R REGULAR U-100 INSULN) 100 unit/mL injection, SLIDING SCALE BLOOD SUGAR 0-150=0 UNITS,150-200=3 UNITS, 200-250=6 UNITS,250-300=9 UNITS, 300-350=12 UNITS, Disp: 40 mL, Rfl: 1  ???  insulin syringe-needle U-100 (BD INSULIN SYRINGE ULTRA-FINE) 1 mL 31 gauge x 5/16 (8 mm) Syrg, Use up to 5 x daily to administer insulin., Disp: 100 each, Rfl: 1  ???  ketoconazole (NIZORAL) 2 % cream, Apply 1 application topically daily., Disp: 15 g, Rfl: 11  ???  lancets Misc, 1 each by Miscellaneous route Three (3) times a day before meals., Disp: 300 each, Rfl: 1  ???  levothyroxine (SYNTHROID) 50 MCG tablet, Take 1 tablet (50 mcg total) by mouth daily., Disp: 90 tablet, Rfl: 1  ???  lidocaine (XYLOCAINE) 5 % ointment, lidocaine 5 % topical ointment, Disp: , Rfl:   ???  miscellaneous medical supply Misc, Gauze pads 4 x 4 inches and paper tape, Disp: 30 each, Rfl: 0  ???  miscellaneous medical supply Misc, Nebulizer- Use as directed with albuterol nebs for SOB, Disp: 1 each, Rfl: 0  ???  MITIGARE 0.6 mg cap capsule, Take 1.2 mg on Day 1 of flare, followed by 0.6 mg one hour later. Day 2 take 0.6 mg BID until flare resolves., Disp: 60 capsule, Rfl: 3  ???  montelukast (SINGULAIR) 10 mg tablet, Take 1 tablet (10 mg total) by mouth daily., Disp: 90 each, Rfl: 1  ???  oxybutynin (DITROPAN) 5 MG tablet, Take 1 tablet (5 mg total) by mouth Two (2) times a day., Disp: 180 tablet, Rfl: 1  ???  polyethylene glycol (GLYCOLAX) 17 gram/dose powder, take 17GM (DISSOLVED IN WATER) by mouth once daily, Disp: , Rfl:   ???  PROAIR HFA 90 mcg/actuation inhaler, Inhale 2 puffs every four (4) hours as needed for wheezing., Disp: 3 Inhaler, Rfl: 1 ???  propranoloL (INDERAL LA) 120 mg 24 hr capsule, Take 1 capsule (120 mg total) by mouth daily., Disp: 90 capsule, Rfl: 1  ???  topiramate (TOPAMAX) 50 MG tablet, Take 1.5 tablets (75 mg total) by mouth Two (2) times a day., Disp: 270 tablet, Rfl: 1  ???  traZODone (DESYREL) 100 MG tablet, take 2 tablets by mouth  at bedtime, Disp: , Rfl: 0    Allergies  Erythromycin, Penicillins, Sulfa (sulfonamide antibiotics), Other, Erythromycin base, and Sulfasalazine    Family History   Problem Relation Age of Onset   ??? Cancer Maternal Grandfather    ??? Hearing loss Maternal Grandfather    ??? Cancer Paternal Grandfather    ??? COPD Paternal Grandmother    ??? Arthritis Paternal Grandmother    ??? Depression Paternal Grandmother    ??? Diabetes Mother    ??? Heart disease Mother    ??? Migraines Mother    ??? Arthritis Mother    ??? Depression Mother    ??? GER disease Mother    ??? Hypertension Mother    ??? Angina Mother    ??? COPD Mother    ??? Glaucoma Mother    ??? Diabetes Sister    ??? Migraines Sister    ??? Asthma Sister    ??? Depression Sister    ??? Diabetes Brother    ??? Asthma Brother    ??? Diabetes Maternal Grandmother    ??? Heart disease Maternal Grandmother    ??? Migraines Maternal Grandmother    ??? Depression Maternal Grandmother    ??? Angina Maternal Grandmother    ??? Hypertension Maternal Grandmother    ??? Stroke Maternal Grandmother    ??? Diabetes Maternal Uncle    ??? Diabetes Maternal Uncle    ??? Liver disease Maternal Uncle    ??? Kidney disease Maternal Uncle    ??? Asthma Brother    ??? Colorectal Cancer Neg Hx    ??? Esophageal cancer Neg Hx    ??? Liver cancer Neg Hx    ??? Pancreatic cancer Neg Hx    ??? Stomach cancer Neg Hx    ??? Amblyopia Neg Hx    ??? Blindness Neg Hx    ??? Retinal detachment Neg Hx    ??? Strabismus Neg Hx    ??? Macular degeneration Neg Hx    ??? Anesthesia problems Neg Hx    ??? Broken bones Neg Hx    ??? Clotting disorder Neg Hx    ??? Collagen disease Neg Hx    ??? Dislocations Neg Hx    ??? Fibromyalgia Neg Hx    ??? Gout Neg Hx    ??? Hemophilia Neg Hx ??? Osteoporosis Neg Hx    ??? Rheumatologic disease Neg Hx    ??? Scoliosis Neg Hx    ??? Severe sprains Neg Hx    ??? Sickle cell anemia Neg Hx    ??? Spinal Compression Fracture Neg Hx    ??? Melanoma Neg Hx    ??? Basal cell carcinoma Neg Hx    ??? Squamous cell carcinoma Neg Hx    ??? Deep vein thrombosis Neg Hx        Social History  Social History     Tobacco Use   ??? Smoking status: Never Smoker   ??? Smokeless tobacco: Never Used   Substance Use Topics   ??? Alcohol use: Never     Alcohol/week: 0.0 standard drinks     Comment: rare social   ??? Drug use: Never       Review of Systems  Constitutional: Negative for fever.  Cardiovascular: Negative for chest pain.  Respiratory: Negative for shortness of breath.  Gastrointestinal: Negative for nausea.  Musculoskeletal: Positive for left ankle and foot pain as above.  Skin: Negative for rash.  Neurological: Negative for acute weakness or numbness.  Remainder of 10 point  review of systems is negative.    Physical Exam     VITAL SIGNS:    ED Triage Vitals [07/01/19 0117]   Enc Vitals Group      BP 140/84      Heart Rate 78      SpO2 Pulse       Resp 22      Temp 36.6 ??C (97.9 ??F)      Temp Source Oral      SpO2 94 %       Constitutional: Alert and oriented. Well appearing and in no distress.  Eyes: Conjunctivae are normal.  ENT       Head: Normocephalic and atraumatic.       Nose: No congestion.       Mouth/Throat: Mucous membranes are moist.       Neck: No stridor, neck is supple.  Hematological/Lymphatic/Immunilogical: No cervical lymphadenopathy.  Cardiovascular: Normal rate, regular rhythm. No m/g/r. Normal and symmetric distal pulses are present in all extremities.   Respiratory: Normal respiratory effort. Breath sounds are normal. No wheezes, rales, rhonchi.  Gastrointestinal: Protuberant. Musculoskeletal: Tenderness and edema over the left lateral malleolus.  Tenderness without edema over the left midfoot.  No gross deformities.  No tenderness over the base of the fifth metatarsal. Ligaments stable. Nontender with normal range of motion in all other extremities.       Right lower leg: No tenderness or edema.       Left lower leg: No tenderness or edema.  Neurologic: Normal speech and language. No gross focal neurologic deficits are appreciated.  Skin: Skin is warm, dry and intact. No rash noted. No ecchymosis.  Psychiatric: Mood and affect are normal. Speech and behavior are normal.        Radiology     XR Foot 3 Or More Views Left   Final Result   Soft tissue swelling along the dorsal and lateral aspects of the foot without underlying osseous abnormality.      No fracture or Lisfranc injury, as clinically questioned.      XR Ankle 3 Or More Views Left   Final Result   Soft tissue swelling along the dorsal and lateral aspects of the foot without underlying osseous abnormality.      No fracture or Lisfranc injury, as clinically questioned.      XR Foot 3 Or More Views Left   Final Result   Soft tissue swelling along the dorsal and lateral aspects of the foot without underlying osseous abnormality.      No fracture or Lisfranc injury, as clinically questioned.          Pertinent labs & imaging results that were available during my care of the patient were reviewed by me and considered in my medical decision making (see chart for details).     Jamas Lav, MD  07/02/19 [phone]

## 2019-07-01 NOTE — Unmapped (Deleted)
3:04 AM  Pt left after triage and prior to my assessment. I did not participate in the care of this patient.     Jamas Lav, MD  07/01/19 [phone]

## 2019-07-01 NOTE — Unmapped (Signed)
[facility] Refill Coordination Note    Specialty Medication(s) to be Shipped:   Inflammatory Disorders: Humira    Other medication(s) to be shipped: N/A     Logan Quinn, DOB: Mar 19, 1981  Phone: [phone] (home)       All above HIPAA information was verified with patient.     Was a Nurse, learning disability used for this call? No    Completed refill call assessment today to schedule patient's medication shipment from the [facility] [phone]).       Specialty medication(s) and dose(s) confirmed: Regimen is correct and unchanged.   Changes to medications: Logan Quinn reports no changes at this time.  Changes to insurance: No  Questions for the pharmacist: No    Confirmed patient received Welcome Packet with first shipment. The patient will receive a drug information handout for each medication shipped and additional FDA Medication Guides as required.       DISEASE/MEDICATION-SPECIFIC INFORMATION        For patients on injectable medications: Patient currently has 1 doses left.  Next injection is scheduled for 07/01/19.    SPECIALTY MEDICATION ADHERENCE     Medication Adherence    Patient reported X missed doses in the last month: 0  Specialty Medication: Humira  Patient is on additional specialty medications: No                Humira 40 mg/0.97ml: 1 days of medicine on hand       SHIPPING     Shipping address confirmed in Epic.     Delivery Scheduled: Yes, Expected medication delivery date: 07/06/19.     Medication will be delivered via Same Day Courier to the prescription address in Epic WAM.    Logan Quinn [facility] Specialty Technician

## 2019-07-06 MED FILL — HUMIRA PEN CITRATE FREE 40 MG/0.4 ML: 28 days supply | Qty: 8 | Fill #3

## 2019-07-06 MED FILL — HUMIRA PEN CITRATE FREE 40 MG/0.4 ML: 28 days supply | Qty: 8 | Fill #3 | Status: AC

## 2019-07-08 ENCOUNTER — Ambulatory Visit (INDEPENDENT_AMBULATORY_CARE_PROVIDER_SITE_OTHER): Payer: Medicaid Other | Admitting: Vascular Surgery

## 2019-07-27 NOTE — Unmapped (Signed)
Reviewed Dexcom data. Average glucose higher than previous monthly data.

## 2019-07-29 ENCOUNTER — Ambulatory Visit (INDEPENDENT_AMBULATORY_CARE_PROVIDER_SITE_OTHER): Payer: Medicaid Other | Admitting: Vascular Surgery

## 2019-07-29 DIAGNOSIS — L732 Hidradenitis suppurativa: Principal | ICD-10-CM

## 2019-07-29 NOTE — Unmapped (Signed)
Last seen one month ago. Requesting humira refill. Pended.

## 2019-07-29 NOTE — Unmapped (Signed)
[facility] Refill Coordination Note    Specialty Medication(s) to be Shipped:   Inflammatory Disorders: Humira    Other medication(s) to be shipped: n/a     Logan Quinn, DOB: [birth year]  Phone: [phone] (home)       All above HIPAA information was verified with patient.     Was a Nurse, learning disability used for this call? No    Completed refill call assessment today to schedule patient's medication shipment from the [facility] [phone]).       Specialty medication(s) and dose(s) confirmed: Regimen is correct and unchanged.   Changes to medications: Logan Quinn reports no changes at this time.  Changes to insurance: No  Questions for the pharmacist: No    Confirmed patient received Welcome Packet with first shipment. The patient will receive a drug information handout for each medication shipped and additional FDA Medication Guides as required.       DISEASE/MEDICATION-SPECIFIC INFORMATION        For patients on injectable medications: Patient currently has 1 doses left.  Next injection is scheduled for 07/29/2019.    SPECIALTY MEDICATION ADHERENCE     Medication Adherence    Patient reported X missed doses in the last month: 0  Specialty Medication: humira cf 40 mg/0.4 ml  Patient is on additional specialty medications: No  Any gaps in refill history greater than 2 weeks in the last 3 months: no  Demonstrates understanding of importance of adherence: yes  Informant: patient  Reliability of informant: reliable  Confirmed plan for next specialty medication refill: delivery by pharmacy  Refills needed for supportive medications: not needed                humira cf 40 mg/0.4 ml . 7 days on hand      SHIPPING     Shipping address confirmed in Epic.     Delivery Scheduled: Yes, Expected medication delivery date: 08/03/2019.  However, Rx request for refills was sent to the provider as there are none remaining.     Medication will be delivered via Same Day Courier to the prescription address in Epic WAM.    Chonda Baney D Ulys Favia   [facility] Specialty Technician

## 2019-07-30 MED ORDER — HUMIRA PEN CITRATE FREE 40 MG/0.4 ML
SUBCUTANEOUS | 3 refills | 0.00000 days | Status: CP
Start: 2019-07-30 — End: ?
  Filled 2019-08-03: qty 8, 28d supply, fill #0

## 2019-08-03 MED FILL — HUMIRA PEN CITRATE FREE 40 MG/0.4 ML: 28 days supply | Qty: 8 | Fill #0 | Status: AC

## 2019-08-10 MED ORDER — CHLORHEXIDINE GLUCONATE 0.12 % MOUTHWASH
Freq: Two times a day (BID) | OROMUCOSAL | 0 refills | 15 days | Status: CP
Start: 2019-08-10 — End: 2020-08-09

## 2019-08-10 NOTE — Unmapped (Signed)
s like last ordered was 2019 and was D/C'ed 12/2018 due to not using

## 2019-08-13 NOTE — Unmapped (Signed)
Assessment and Plan:     Logan Quinn was seen today for diabetes.    Diagnoses and all orders for this visit:    Type 2 diabetes mellitus with hyperglycemia, with long-term current use of insulin (CMS-HCC)  HGB A1c 6.5 (down from 6.9 three months ago).   DM well controlled. Continue glipizide 20 mg BID, Lantus 90 units HS and Humulin as directed with sliding scale.  Encouraged patient to continue carb controlled diet and regular exercise.   -     POCT glycosylated hemoglobin (Hb A1C)  -     Microalbumin / creatinine urine ratio; Future  -     insulin glargine (LANTUS U-100 INSULIN) 100 unit/mL injection; Inject 0.8 mL (80 Units total) under the skin nightly.    Morbid obesity with BMI of 50.0-59.9, adult (CMS-HCC)  Counseled on weight loss strategies including well balanced diet, portion control, regular exercise.    Migraine without status migrainosus, not intractable, unspecified migraine type  Increase Aimovig to 140 mg every 28 days.   -     erenumab-aooe 140 mg/mL AtIn; Inject 140 mg under the skin every twenty-eight (28) days.    Acute pain of right knee  Proceed with XR for further evaluation.  Reviewed home care. Encouraged exercise.  -     XR Knee 3 Views Right; Future    HPI:      Logan Quinn  is here for   Chief Complaint   Patient presents with   ??? Diabetes     f/u     Diabetes: Patient presents for follow up of diabetes.  A1C goal is <8.  Diabetes has customarily been at goal (complicated by: obesity).  Current symptoms include: none. Symptoms have stabilized. Patient denies foot ulcerations, hyperglycemia, hypoglycemia , increased appetite, nausea, paresthesia of the feet, polydipsia, polyuria, visual disturbances, vomiting and weight loss. Evaluation to date has included: hemoglobin A1C.  Home sugars: BGs are running  consistent with Hgb A1C. He is sending PCP Dexcom readings every 1-2 months via MyChart. Current treatment: Continued glipizide and insulin which has been effective. Patient is trying to walk regularly. He will walk to mailboxes (distance is within sight) and back to house, then need to rest.  Answers for HPI/ROS submitted by the patient on 08/13/2019   Diabetes problem  Diabetes type: type 2  MedicAlert ID: No  fatigue: No  foot paresthesias: No  foot ulcerations: No  polyphagia: No  polyuria: Yes  visual change: No  Symptom course: stable  confusion: No  hunger: No  mood changes: No  pallor: No  sleepiness: No  speech difficulty: No  sweats: No  blackouts: No  hospitalization: No  nocturnal hypoglycemia: No  required assistance: No  required glucagon: No  CVA: No  heart disease: No  impotence: Yes  nephropathy: No  peripheral neuropathy: No  PVD: No  retinopathy: No  CAD risks: family history, obesity, stress  Current treatments: diet, insulin injections, oral agent (monotherapy)  Treatment compliance: all of the time  Dose schedule: pre-breakfast, pre-lunch, pre-dinner, at bedtime  Given by: patient  Injection sites: abdominal wall, arms, thighs  Monitoring compliance: excellent  Blood glucose trend: decreasing steadily  breakfast time: 6-7 am  breakfast glucose level: 70-90  lunch time: 11-12 pm  lunch glucose level: 90-110  dinner time: 7-8 pm  dinner glucose level: 110-130  High score: 110-130  Overall: 110-130  Current diet: generally healthy  Meal planning: calorie counting, carbohydrate counting  Exercise: intermittently  Dietitian visit: No  Eye exam current: Yes  Sees podiatrist: Yes    Pain: Patient presents with pain located in right knee. Symptoms started 1  month ago and are gradually worsening. Related symptoms include stiffness, weakness. Symptoms are worse when sitting down at end of day, standing up from chair. Therapies tried: Tylenol, ibuprofen.     He reports bilateral pedal edema. He limits sodium intake - does not add salt to cooking, salt shaker not on table. Patient is using lymphedema pump, compression socks, and trying to walk regularly. He has been on diuretic in the past, but vascular specialist discontinued - states specialist felt it was not needed.      He requests to increase Aimovig dosage, noting it seems to be less effective recently.        PCMH Components:     Goals     ??? Self- Management Goal        Things to think about to help me reach my goal:     What are you going to do? Increase activity/motivation   How and how much? Clean up and do something constructive   How frequent? 2 x week    Barriers to success? fatigue   Solutions to barriers? Going to sleep earlier          ??? Self- Management Goal        Things to think about to help me reach my goal:     What are you going to do? Continue physical activity    How and how much? Fixing house    How frequent? daily   Barriers to success?    Solutions to barriers?                Medication adherence and barriers to the treatment plan have been addressed. Opportunities to optimize healthy behaviors have been discussed. Patient / caregiver voiced understanding.      Past Medical/Surgical History:     Past Medical History:   Diagnosis Date   ??? Acne    ??? Allergic    ??? Anxiety    ??? Depression    ??? Diabetes mellitus (CMS-HCC)    ??? GERD (gastroesophageal reflux disease)    ??? Gout    ??? Hypertension    ??? IBS (irritable bowel syndrome)    ??? 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 Liver disease    ??? Migraines    ??? Morbid obesity with BMI of 60.0-69.9, adult (CMS-HCC)    ??? 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 Obstructive sleep apnea    ??? OSA on CPAP    ??? Severe obstructive sleep apnea    ??? Trapezius muscle strain 12/07/2013   ??? Urinary incontinence, nocturnal enuresis    ??? Venous insufficiency      Past Surgical History:   Procedure Laterality Date   ??? EYE SURGERY  11/15   ??? PR COLONOSCOPY FLX DX W/COLLJ SPEC WHEN PFRMD N/A 03/24/2013    Procedure: COLONOSCOPY, FLEXIBLE, PROXIMAL TO SPLENIC FLEXURE; DIAGNOSTIC, W/WO COLLECTION SPECIMEN BY BRUSH OR WASH;  Surgeon: Clint Bolder, MD;  [facility];  Service: Gastroenterology   ??? PR EYE SURG POST SGMT PROC UNLISTED Left     pneumatic retinopexy OS   ??? PR UPPER GI ENDOSCOPY,DIAGNOSIS N/A 02/02/2013    Procedure: UGI ENDO, INCLUDE ESOPHAGUS, STOMACH, & DUODENUM &/OR JEJUNUM; DX W/WO COLLECTION SPECIMN, BY BRUSH OR WASH;  Surgeon: Malcolm Metro, MD;  Location: [facility] [place];  Service: Gastroenterology   ??? [place] PYLORIC STENOSIS ([place] HISTORICAL RESULT)         Family History:     Family History   Problem Relation Age of Onset   ??? Cancer Maternal Grandfather    ??? Hearing loss Maternal Grandfather    ??? Cancer Paternal Grandfather    ??? COPD Paternal Grandmother    ??? Arthritis Paternal Grandmother    ??? Depression Paternal Grandmother    ??? Diabetes Mother    ??? Heart disease Mother    ??? Migraines Mother    ??? Arthritis Mother    ??? Depression Mother    ??? GER disease Mother    ??? Hypertension Mother    ??? Angina Mother    ??? COPD Mother    ??? Glaucoma Mother    ??? Diabetes Sister    ??? Migraines Sister    ??? Asthma Sister    ??? Depression Sister    ??? Diabetes Brother    ??? Asthma Brother    ??? Diabetes Maternal Grandmother    ??? Heart disease Maternal Grandmother    ??? Migraines Maternal Grandmother    ??? Depression Maternal Grandmother    ??? Angina Maternal Grandmother    ??? Hypertension Maternal Grandmother    ??? Stroke Maternal Grandmother    ??? Diabetes Maternal Uncle    ??? Diabetes Maternal Uncle    ??? Liver disease Maternal Uncle    ??? Kidney disease Maternal Uncle    ??? Asthma Brother    ??? Colorectal Cancer Neg Hx    ??? Esophageal cancer Neg Hx    ??? Liver cancer Neg Hx    ??? Pancreatic cancer Neg Hx    ??? Stomach cancer Neg Hx    ??? Amblyopia Neg Hx    ??? Blindness Neg Hx    ??? Retinal detachment Neg Hx    ??? Strabismus Neg Hx    ??? Macular degeneration Neg Hx    ??? Anesthesia problems Neg Hx    ??? Broken bones Neg Hx    ??? Clotting disorder Neg Hx    ??? Collagen disease Neg Hx    ??? Dislocations Neg Hx    ??? Fibromyalgia Neg Hx    ??? Gout Neg Hx    ??? Hemophilia Neg Hx    ??? Osteoporosis Neg Hx    ??? Rheumatologic disease Neg Hx    ??? Scoliosis Neg Hx    ??? Severe sprains Neg Hx    ??? Sickle cell anemia Neg Hx    ??? Spinal Compression Fracture Neg Hx    ??? Melanoma Neg Hx    ??? Basal cell carcinoma Neg Hx    ??? Squamous cell carcinoma Neg Hx    ??? Deep vein thrombosis Neg Hx        Social History:     Social History     Socioeconomic History   ??? Marital status: Married     Spouse name: None   ??? Number of children: None   ??? Years of education: None   ??? Highest education level: None   Occupational History   ??? None   Social Needs   ??? Financial resource strain: None   ??? Food insecurity     Worry: None     Inability: None   ??? Transportation needs     Medical: None     Non-medical: None   Tobacco Use   ???  Smoking status: Never Smoker   ??? Smokeless tobacco: Never Used   Substance and Sexual Activity   ??? Alcohol use: Never     Alcohol/week: 0.0 standard drinks     Comment: rare social   ??? Drug use: Never   ??? Sexual activity: Yes     Partners: Female     Birth control/protection: None   Lifestyle   ??? Physical activity     Days per week: None     Minutes per session: None   ??? Stress: None   Relationships   ??? Social Wellsite geologist on phone: None     Gets together: None     Attends religious service: None     Active member of club or organization: None     Attends meetings of clubs or organizations: None     Relationship status: None   Other Topics Concern   ??? Do you use sunscreen? Yes   ??? Tanning bed use? No   ??? Are you easily burned? No   ??? Excessive sun exposure? No   ??? Blistering sunburns? No   Social History Narrative   ??? None       Allergies:     Erythromycin, Penicillins, Sulfa (sulfonamide antibiotics), Other, Erythromycin base, and Sulfasalazine    Current Medications:     Current Outpatient Medications   Medication Sig Dispense Refill   ??? ADVAIR DISKUS 500-50 mcg/dose diskus Inhale 1 puff 2 (two) times a day. 13 Inhaler 1   ??? albuterol 2.5 mg /3 mL (0.083 %) nebulizer solution Inhale 3 mL (2.5 mg total) by nebulization every four (4) hours as needed for wheezing. 25 vial 2   ??? albuterol HFA 90 mcg/actuation inhaler Inhale 2 puffs every six (6) hours as needed for wheezing. 3 Inhaler 1   ??? ALPRAZolam (XANAX) 0.5 MG tablet Take 1 tablet (0.5 mg total) by mouth Three (3) times a day. (Patient taking differently: Take 1 mg by mouth Three (3) times a day. Usually takes nightly for anxiety) 60 tablet 0   ??? blood sugar diagnostic Strp by Other route Three (3) times a day before meals. 300 each 1   ??? blood-glucose meter kit Use as directed 1 each 0   ??? cephalexin (KEFLEX) 500 MG capsule Take 1 capsule (500 mg total) by mouth Four (4) times a day. 40 capsule 0   ??? cetirizine (ZYRTEC) 10 MG tablet Take 1 tablet (10 mg total) by mouth nightly as needed for allergies. 90 tablet 1   ??? chlorhexidine (PERIDEX) 0.12 % solution 15 mL by Mouth route Two (2) times a day. 437 mL 0   ??? clindamycin (CLEOCIN T) 1 % external solution Apply topically Two (2) times a day. 60 mL 4   ??? clobetasoL (TEMOVATE) 0.05 % ointment Apply daily to painful affected areas if needed for flares, then stop 15 g 1   ??? cyclobenzaprine (FLEXERIL) 10 MG tablet Take 1 tablet (10 mg total) by mouth Three (3) times a day as needed for muscle spasms. 60 tablet 0   ??? dextroamphetamine-amphetamine (ADDERALL) 20 mg tablet Take 1 tablet by mouth Two (2) times a day.     ??? dicyclomine (BENTYL) 10 mg capsule Take 1 capsule (10 mg total) by mouth Three (3) times a day. 270 capsule 1   ??? diphenoxylate-atropine (LOMOTIL) 2.5-0.025 mg per tablet Take 1 tablet by mouth two (2) times a day as needed for diarrhea. 30 tablet 5   ???  DULoxetine (CYMBALTA) 60 MG capsule Take 60 mg by mouth Two (2) times a day.     ??? empty container (SHARPS CONTAINER) Misc Use as directed to dispose of Humira needles 1 each 2   ??? empty container Misc Use as directed to dispose of Humira injections 1 each 2   ??? ergocalciferol (DRISDOL) 1,250 mcg (50,000 unit) capsule Take 1 capsule (50,000 Units total) by mouth once a week. 4 capsule 2   ??? famotidine (PEPCID) 20 MG tablet Take 1 tablet (20 mg total) by mouth Two (2) times a day. 180 tablet 1   ??? fluticasone propionate (FLONASE) 50 mcg/actuation nasal spray 1 spray by Each Nare route Two (2) times a day. 3 Bottle 3   ??? glipiZIDE (GLUCOTROL) 10 MG tablet Take 2 tablets (20 mg total) by mouth Two (2) times a day (30 minutes before a meal). 360 tablet 1   ??? HUMIRA PEN CITRATE FREE 40 MG/0.4 ML Inject the contents of 2 pens (80mg  total) under the skin every seven (7) days. 8 each 3   ??? hydrocortisone (PROCTOSOL HC) 2.5 % rectal cream Insert 1 application into the rectum two (2) times a day as needed. 28.35 g 2   ??? ibuprofen (ADVIL,MOTRIN) 800 MG tablet Take 1 tablet (800 mg total) by mouth every eight (8) hours as needed. 90 tablet 1   ??? insulin glargine (LANTUS U-100 INSULIN) 100 unit/mL injection Inject 0.8 mL (80 Units total) under the skin nightly. 90 mL 3   ??? insulin regular (HUMULIN R REGULAR U-100 INSULN) 100 unit/mL injection SLIDING SCALE BLOOD SUGAR 0-150=0 UNITS,150-200=3 UNITS, 200-250=6 UNITS,250-300=9 UNITS, 300-350=12 UNITS 40 mL 1   ??? insulin syringe-needle U-100 (BD INSULIN SYRINGE ULTRA-FINE) 1 mL 31 gauge x 5/16 (8 mm) Syrg Use up to 5 x daily to administer insulin. 100 each 1   ??? ketoconazole (NIZORAL) 2 % cream Apply 1 application topically daily. 15 g 11   ??? lancets Misc 1 each by Miscellaneous route Three (3) times a day before meals. 300 each 1   ??? levothyroxine (SYNTHROID) 50 MCG tablet Take 1 tablet (50 mcg total) by mouth daily. 90 tablet 1   ??? lidocaine (XYLOCAINE) 5 % ointment lidocaine 5 % topical ointment     ??? miscellaneous medical supply Misc Gauze pads 4 x 4 inches and paper tape 30 each 0   ??? miscellaneous medical supply Misc Nebulizer- Use as directed with albuterol nebs for SOB 1 each 0   ??? MITIGARE 0.6 mg cap capsule Take 1.2 mg on Day 1 of flare, followed by 0.6 mg one hour later. Day 2 take 0.6 mg BID until flare resolves. 60 capsule 3   ??? montelukast (SINGULAIR) 10 mg tablet Take 1 tablet (10 mg total) by mouth daily. 90 each 1   ??? oxybutynin (DITROPAN) 5 MG tablet Take 1 tablet (5 mg total) by mouth Two (2) times a day. 180 tablet 1   ??? polyethylene glycol (GLYCOLAX) 17 gram/dose powder take 17GM (DISSOLVED IN WATER) by mouth once daily     ??? PROAIR HFA 90 mcg/actuation inhaler Inhale 2 puffs every four (4) hours as needed for wheezing. 3 Inhaler 1   ??? propranoloL (INDERAL LA) 120 mg 24 hr capsule Take 1 capsule (120 mg total) by mouth daily. 90 capsule 1   ??? topiramate (TOPAMAX) 50 MG tablet Take 1.5 tablets (75 mg total) by mouth Two (2) times a day. 270 tablet 1   ??? traZODone (DESYREL) 100 MG  tablet take 2 tablets by mouth at bedtime  0   ??? acetaminophen (TYLENOL) 500 MG tablet Take 2 tablets (1,000 mg total) by mouth Three (3) times a day. 30 tablet 0   ??? erenumab-aooe 140 mg/mL AtIn Inject 140 mg under the skin every twenty-eight (28) days. 1 mL 6     No current facility-administered medications for this visit.        Health Maintenance:     Health Maintenance Summary w/Most Recent Date       Status Date      Urine Albumin/Creatinine Ratio Overdue 07/09/2019      Done 07/08/2018 ALBUMIN / CREATININE URINE RATIO Albumin/Creatinine Ratio           Done 06/30/2017 ALBUMIN / CREATININE URINE RATIO Albumin/Creatinine Ratio           Done 05/24/2016 ALBUMIN / CREATININE URINE RATIO Albumin/Creatinine Ratio           Done 03/24/2015 ALBUMIN / CREATININE URINE RATIO Albumin/Creatinine Ratio           Done 04/05/2013 MICROALBUMIN / CREATININE URINE RATIO Microalbumin/Creatinine Ratio          Foot Exam Next Due 10/21/2019      Done 10/21/2018 [facility] DIABETES FOOT EXAM     Done 10/06/2017 [facility] DIABETES FOOT EXAM     Done 09/13/2016 [facility] DIABETES FOOT EXAM     Done 08/07/2015 [facility] DIABETES FOOT EXAM    Hemoglobin A1c Next Due 02/18/2020      Done 08/18/2019 Registry Metric: Last Hemoglobin A1c Date     Done 08/18/2019 POCT GLYCOSYLATED HEMOGLOBIN (HGB A1C) HGB A1C, RAP/PDS           Done 05/20/2019 POCT GLYCOSYLATED HEMOGLOBIN (HGB A1C) HGB A1C, RAP/PDS           Done 02/08/2019 POCT GLYCOSYLATED HEMOGLOBIN (HGB A1C) HGB A1C, RAP/PDS           Done 10/21/2018 POCT GLYCOSYLATED HEMOGLOBIN (HGB A1C) HGB A1C, RAP/PDS           Patient has more history with this topic...    Serum Creatinine Monitoring Next Due 04/24/2020      Done 04/25/2019 BASIC METABOLIC PANEL Creatinine           Done 04/21/2019 BASIC METABOLIC PANEL Creatinine           Done 04/07/2018 COMPREHENSIVE METABOLIC PANEL Creatinine           Done 04/20/2017 BASIC METABOLIC PANEL Creatinine           Done 11/10/2016 COMPREHENSIVE METABOLIC PANEL Creatinine           Patient has more history with this topic...    Potassium Monitoring Next Due 04/24/2020      Done 04/25/2019 BASIC METABOLIC PANEL Potassium           Done 04/21/2019 BASIC METABOLIC PANEL Potassium           Done 04/07/2018 COMPREHENSIVE METABOLIC PANEL Potassium           Done 04/20/2017 BASIC METABOLIC PANEL Potassium           Done 11/10/2016 COMPREHENSIVE METABOLIC PANEL Potassium           Patient has more history with this topic...    Retinal Eye Exam Next Due 06/09/2020      Done 06/10/2019 SmartData: [facility] FUNDUS OD PERIPHERY     Done 06/10/2019 SmartData: FINDINGS - PE - EYES - FUNDUSCOPIC - PERIPHERY - RIGHT PERIPHERY  NORMAL     Done 06/10/2019 SmartData: OPHTH FUNDUS OS PERIPHERY     Done 06/10/2019 OCT, RETINA - OU - BOTH EYES     Done 09/17/2017 SmartData: OPHTH FUNDUS OD PERIPHERY     Patient has more history with this topic...    DTaP/Tdap/Td Vaccines Next Due 07/17/2023      Done 07/16/2013 Imm Admin: TdaP     Done 07/18/2008 Imm Admin: TdaP     Done 04/29/2000 Imm Admin: Tetanus and diptheria,(adult), adsorbed, 2Lf tetanus toxoid, PF    Pneumococcal Vaccine This plan is no longer active.      Done 12/30/2012 Imm Admin: PNEUMOCOCCAL POLYSACCHARIDE 23    Hepatitis C Screen This plan is no longer active.      Done 04/29/2018 HEPATITIS C ANTIBODY Hepatitis C Ab           Done 01/01/2013 HEPATITIS C ANTIBODY    Influenza Vaccine This plan is no longer active.      Done 03/31/2019 Imm Admin: INFLUENZA INJ MDCK PF, Quad (Flucelvax)(4y and up Egg Free)     Done 03/25/2019 Imm Admin: Influenza Virus Vaccine, unspecified formulation     Done 02/23/2018 Imm Admin: Influenza Vaccine Quad (IIV4 PF) 58mo+ injectable     Done 02/23/2018 Imm Admin: Influenza Virus Vaccine, unspecified formulation     Done 03/19/2017 Imm Admin: Influenza Vaccine Quad (IIV4 PF) 50mo+ injectable     Patient has more history with this topic...          Immunizations:     Immunization History   Administered Date(s) Administered   ??? Hepatitis B Vaccine, Unspecified Formulation 12/27/1999, 04/29/2000   ??? Hepatitis B, Adult 03/05/2013, 04/05/2013, 09/03/2013   ??? INFLUENZA INJ MDCK PF, Quad (Flucelvax)(4y and up Egg Free) 03/31/2019   ??? INFLUENZA TIV (TRI) PF (IM) 10/08/2011   ??? Influenza Vaccine Quad (IIV4 PF) 75mo+ injectable 03/05/2013, 03/18/2014, 03/22/2015, 03/05/2016, 03/19/2017, 02/23/2018   ??? Influenza Virus Vaccine, unspecified formulation 03/19/2017, 02/23/2018, 03/25/2019   ??? PNEUMOCOCCAL POLYSACCHARIDE 23 12/30/2012   ??? PPD Test 10/28/2016   ??? TdaP 07/18/2008, 07/16/2013   ??? Tetanus and diptheria,(adult), adsorbed, 2Lf tetanus toxoid, PF 04/29/2000       I have reviewed and (if needed) updated the patient's problem list, medications, allergies, past medical and surgical history, social and family history.    ROS:      ROS  Comprehensive 10 point ROS negative unless otherwise stated in the HPI.       Vital Signs:     Wt Readings from Last 3 Encounters:   08/18/19 (!) 230.4 kg (508 lb)   04/25/19 (!) 221.8 kg (489 lb)   04/21/19 (!) 218.2 kg (481 lb)     Temp Readings from Last 3 Encounters:   08/18/19 36.8 ??C (98.2 ??F) (Oral)   07/01/19 36.6 ??C (97.9 ??F) (Oral)   06/15/19 36.7 ??C (98 ??F) (Oral)     BP Readings from Last 3 Encounters:   08/18/19 114/70   07/01/19 142/63   06/15/19 174/69 Pulse Readings from Last 3 Encounters:   08/18/19 67   07/01/19 78   06/15/19 81     Estimated body mass index is 70.88 kg/m?? as calculated from the following:    Height as of this encounter: 180.3 cm (5' 10.98).    Weight as of this encounter: 230.4 kg (508 lb).  Facility age limit for growth percentiles is 20 years.    Objective:      General: Alert and oriented  x3. Well-appearing. No acute distress.   HEENT:  Normocephalic.  Atraumatic. Conjunctiva and sclera normal. OP MMM without lesions.   Neck:  Supple. No thyroid enlargement. No adenopathy.   Heart:  Regular rate and rhythm . Normal S1, S2.  No murmurs, rubs or gallops.   Lungs:  No respiratory distress.  Lungs clear to auscultation. No wheezes, rhonchi, or rales.   GI/GU:  Soft, +BS, obese, nondistended, non-TTP.   Extremities:  No edema. Peripheral pulses normal.   MSK: Right knee: Moderate TTP over right patella. Mild crepitus with motion. No visible swelling or effusion.  Skin:  Warm, dry. No rash or lesions present.   Neuro:  Non-focal. No obvious weakness.   Psych:  Affect normal, eye contact good, speech clear and coherent.      I attest that I, Bayard Hugger, personally documented this note while acting as scribe for [name], FNP.      Bayard Hugger, Scribe.  08/18/2019     The documentation recorded by the scribe accurately reflects the service I personally performed and the decisions made by me.    [name], FNP

## 2019-08-18 ENCOUNTER — Ambulatory Visit: Admit: 2019-08-18 | Discharge: 2019-08-19 | Payer: MEDICAID | Attending: Family | Primary: Family

## 2019-08-18 DIAGNOSIS — Z794 Long term (current) use of insulin: Principal | ICD-10-CM

## 2019-08-18 DIAGNOSIS — E1165 Type 2 diabetes mellitus with hyperglycemia: Principal | ICD-10-CM

## 2019-08-18 DIAGNOSIS — Z6841 Body Mass Index (BMI) 40.0 and over, adult: Secondary | ICD-10-CM

## 2019-08-18 DIAGNOSIS — G43909 Migraine, unspecified, not intractable, without status migrainosus: Principal | ICD-10-CM

## 2019-08-18 DIAGNOSIS — M25561 Pain in right knee: Principal | ICD-10-CM

## 2019-08-18 MED ORDER — LANTUS U-100 INSULIN 100 UNIT/ML SUBCUTANEOUS SOLUTION
Freq: Every evening | SUBCUTANEOUS | 3 refills | 112.00000 days | Status: CP
Start: 2019-08-18 — End: 2020-08-17

## 2019-08-18 MED ORDER — ERENUMAB-AOOE 140 MG/ML SUBCUTANEOUS AUTO-INJECTOR
SUBCUTANEOUS | 6 refills | 0 days | Status: CP
Start: 2019-08-18 — End: ?

## 2019-08-20 DIAGNOSIS — L732 Hidradenitis suppurativa: Principal | ICD-10-CM

## 2019-08-21 NOTE — Unmapped (Signed)
Patient is requesting a medication refill.Medication was pending. Last ov 06/2019.

## 2019-08-23 MED ORDER — CEPHALEXIN 500 MG CAPSULE
ORAL_CAPSULE | Freq: Four times a day (QID) | ORAL | 0 refills | 10.00000 days | Status: CP
Start: 2019-08-23 — End: ?

## 2019-08-24 NOTE — Unmapped (Signed)
Data reviewed

## 2019-08-25 DIAGNOSIS — Z794 Long term (current) use of insulin: Principal | ICD-10-CM

## 2019-08-25 DIAGNOSIS — E1165 Type 2 diabetes mellitus with hyperglycemia: Principal | ICD-10-CM

## 2019-08-25 MED ORDER — LANTUS U-100 INSULIN 100 UNIT/ML SUBCUTANEOUS SOLUTION
Freq: Every evening | SUBCUTANEOUS | 3 refills | 100.00000 days | Status: CP
Start: 2019-08-25 — End: 2020-08-24

## 2019-08-25 NOTE — Unmapped (Signed)
[facility] Refill Coordination Note    Specialty Medication(s) to be Shipped:   Inflammatory Disorders: Humira    Other medication(s) to be shipped: n/a     Logan Quinn, DOB: 11-22-1980  Phone: [phone] (home)       All above HIPAA information was verified with patient.     Was a Nurse, learning disability used for this call? No    Completed refill call assessment today to schedule patient's medication shipment from the [facility] [phone]).       Specialty medication(s) and dose(s) confirmed: Regimen is correct and unchanged.   Changes to medications: Logan Quinn reports no changes at this time.  Changes to insurance: No  Questions for the pharmacist: No    Confirmed patient received Welcome Packet with first shipment. The patient will receive a drug information handout for each medication shipped and additional FDA Medication Guides as required.       DISEASE/MEDICATION-SPECIFIC INFORMATION        For patients on injectable medications: Patient currently has 1 doses left.  Next injection is scheduled for 08/26/2019.    SPECIALTY MEDICATION ADHERENCE     Medication Adherence    Patient reported X missed doses in the last month: 0  Specialty Medication: humira cf 40 mg/0.4 ml  Patient is on additional specialty medications: No  Any gaps in refill history greater than 2 weeks in the last 3 months: no  Demonstrates understanding of importance of adherence: yes  Informant: patient  Reliability of informant: reliable  Confirmed plan for next specialty medication refill: delivery by pharmacy  Refills needed for supportive medications: not needed                humira cf 40 mg/0.4 ml . 7 days on hand      SHIPPING     Shipping address confirmed in Epic.     Delivery Scheduled: Yes, Expected medication delivery date: 08/27/2019.     Medication will be delivered via Same Day Courier to the prescription address in Epic WAM.    Derrich Gaby D Shadasia Oldfield   [facility] Specialty Technician

## 2019-08-27 MED FILL — HUMIRA PEN CITRATE FREE 40 MG/0.4 ML: 28 days supply | Qty: 8 | Fill #1

## 2019-08-27 MED FILL — HUMIRA PEN CITRATE FREE 40 MG/0.4 ML: 28 days supply | Qty: 8 | Fill #1 | Status: AC

## 2019-09-13 ENCOUNTER — Ambulatory Visit: Payer: Medicaid Other | Admitting: Podiatry

## 2019-09-15 NOTE — Unmapped (Signed)
I am not familiar with this supplement. I did look it up to review the label. It looks like it contains 50 plus natural herbs, minerals, vitamins, extracts. I don't foresee an reaction with your medications however your medication list is extensive so there is always a chance. My biggest concern would be if the fruit extracts in the supplement would raise your blood sugar. Certainly you could try it and closely monitor you blood sugar.

## 2019-09-16 ENCOUNTER — Ambulatory Visit: Payer: Medicaid Other | Admitting: Podiatry

## 2019-09-16 NOTE — Unmapped (Signed)
[facility] Refill Coordination Note    Specialty Medication(s) to be Shipped:   Inflammatory Disorders: Humira    Other medication(s) to be shipped: sharps container kit     Logan Quinn, DOB: 1981/01/29  Phone: [phone] (home)       All above HIPAA information was verified with patient.     Was a Nurse, learning disability used for this call? No    Completed refill call assessment today to schedule patient's medication shipment from the [facility] [phone]).       Specialty medication(s) and dose(s) confirmed: Regimen is correct and unchanged.   Changes to medications: Logan Quinn reports no changes at this time.  Changes to insurance: No  Questions for the pharmacist: No    Confirmed patient received Welcome Packet with first shipment. The patient will receive a drug information handout for each medication shipped and additional FDA Medication Guides as required.       DISEASE/MEDICATION-SPECIFIC INFORMATION        For patients on injectable medications: Patient currently has 1 doses left.  Next injection is scheduled for 09/16/2019.    SPECIALTY MEDICATION ADHERENCE     Medication Adherence    Patient reported X missed doses in the last month: 0  Specialty Medication: Humira CF 40 mg/0.4 ml  Patient is on additional specialty medications: No  Any gaps in refill history greater than 2 weeks in the last 3 months: no  Demonstrates understanding of importance of adherence: yes  Informant: patient  Reliability of informant: reliable  Confirmed plan for next specialty medication refill: delivery by pharmacy  Refills needed for supportive medications: not needed                humira cf 40 mg/0.4 ml . 7 days on hand      SHIPPING     Shipping address confirmed in Epic.     Delivery Scheduled: Yes, Expected medication delivery date: 09/21/2019.     Medication will be delivered via Same Day Courier to the prescription address in Epic WAM.    Logan Quinn D Kham Zuckerman   [facility] Specialty Technician

## 2019-09-17 ENCOUNTER — Ambulatory Visit
Admit: 2019-09-17 | Discharge: 2019-09-17 | Disposition: A | Discharge status: Home / Self Care | Payer: MEDICAID | Attending: Emergency Medicine

## 2019-09-17 DIAGNOSIS — R739 Hyperglycemia, unspecified: Principal | ICD-10-CM

## 2019-09-17 LAB — COMPREHENSIVE METABOLIC PANEL
ALBUMIN: 4.1 g/dL (ref 3.5–5.0)
ALT (SGPT): 30 U/L (ref <50)
ANION GAP: 14 mmol/L (ref 7–15)
BLOOD UREA NITROGEN: 11 mg/dL (ref 7–21)
BUN / CREAT RATIO: 12
CALCIUM: 9.1 mg/dL (ref 8.5–10.2)
CHLORIDE: 102 mmol/L (ref 98–107)
CREATININE: 0.9 mg/dL (ref 0.70–1.30)
EGFR CKD-EPI AA MALE: >90 mL/min/{1.73_m2} (ref >=60)
GLUCOSE RANDOM: 365 mg/dL — ABNORMAL HIGH (ref 70–179)
PROTEIN TOTAL: 7.5 g/dL (ref 6.5–8.3)
SODIUM: 138 mmol/L (ref 135–145)

## 2019-09-17 LAB — CBC W/ AUTO DIFF
BASOPHILS ABSOLUTE COUNT: 0.1 10*9/L (ref 0.0–0.1)
BASOPHILS RELATIVE PERCENT: 1.3 %
EOSINOPHILS RELATIVE PERCENT: 1.2 %
HEMATOCRIT: 43.1 % (ref 38.0–50.0)
HEMOGLOBIN: 15.2 g/dL (ref 13.5–17.5)
LYMPHOCYTES ABSOLUTE COUNT: 3.4 10*9/L (ref 0.7–4.0)
LYMPHOCYTES RELATIVE PERCENT: 40.4 %
MEAN CORPUSCULAR HEMOGLOBIN CONC: 35.2 g/dL (ref 30.0–36.0)
MEAN CORPUSCULAR VOLUME: 94.1 fL (ref 81.0–95.0)
MEAN PLATELET VOLUME: 10.1 fL — ABNORMAL HIGH (ref 7.0–10.0)
MONOCYTES ABSOLUTE COUNT: 0.9 10*9/L (ref 0.1–1.0)
MONOCYTES RELATIVE PERCENT: 10.6 %
NEUTROPHILS ABSOLUTE COUNT: 3.9 10*9/L (ref 1.7–7.7)
NUCLEATED RED BLOOD CELLS: 0 /100{WBCs} (ref <=4)
PLATELET COUNT: 179 10*9/L (ref 150–450)
RED BLOOD CELL COUNT: 4.58 10*12/L (ref 4.32–5.72)
RED CELL DISTRIBUTION WIDTH: 14.2 % (ref 12.0–15.0)
WBC ADJUSTED: 8.4 10*9/L (ref 3.5–10.5)

## 2019-09-17 LAB — BLOOD GAS, VENOUS
BASE EXCESS VENOUS: 1.2 (ref -2.0–2.0)
PCO2 VENOUS: 43 mmHg (ref 40–60)
PH VENOUS: 7.39 (ref 7.32–7.43)

## 2019-09-17 LAB — PROTEIN / CREATININE RATIO, URINE: PROTEIN URINE: <4 mg/dL

## 2019-09-17 LAB — MEAN PLATELET VOLUME: Platelet mean volume:EntVol:Pt:Bld:Qn:Automated count: 10.1 — ABNORMAL HIGH

## 2019-09-17 LAB — ALT (SGPT): Alanine aminotransferase:CCnc:Pt:Ser/Plas:Qn:: 30

## 2019-09-17 LAB — PO2 VENOUS: Oxygen:PPres:Pt:BldV:Qn:: 65 — ABNORMAL HIGH

## 2019-09-17 LAB — PROTEIN URINE: Protein:MCnc:Pt:Urine:Qn:: <4

## 2019-09-17 LAB — ALBUMIN/CREATININE RATIO: Lab: 52.4

## 2019-09-17 LAB — ALBUMIN, URINE RANDOM: ALBUMIN/CREATININE RATIO: 52.4 ug/mg

## 2019-09-17 LAB — CREATININE, URINE: Lab: 52.4

## 2019-09-17 NOTE — Unmapped (Signed)
Recent:   What is the date of your last related visit?  n/a  Related acute medications Rx'd:  Ran out of Lantus 09/15/19, only got a partial dose at bedtime (maybe 30 units) pharmacy unable to get Lantus until 09/17/19 around 1300. Pt unable to take Lantus tonight (09/16/19) Humilin 15 units at lunch, and 20 units about 2100  Home treatment tried:  Pt drinking water    Relevant:   Allergies: Erythromycin, Penicillins, Sulfa (sulfonamide antibiotics), Other, Erythromycin base, and Sulfasalazine  Medications: no new changes, taking all meds prescribed   Health History: Dm, HS  Weight: N/a      Over the past 2 days blood sugar has been running upper 300s, Pt able to get it down to 269 yesterday, but back up to over 400 tonight.   Dexcom G6 - reading high  Checked finger stick BS and it read 426 09/16/19 at 2100    At time of call an hour after 20 units of Humilin, Dexcom still reading high (above 400)      Reason for Disposition  ??? Blood glucose > 400 mg/dL (16.1 mmol/L)    Answer Assessment - Initial Assessment Questions  1. BLOOD GLUCOSE: What is your blood glucose level?       426  2. ONSET: When did you check the blood glucose?    09/16/19 at 2100  3. USUAL RANGE: What is your glucose level usually? (e.g., usual fasting morning value, usual evening value)     A1C 6.5, Pt has a Dexcom G6, normally reads between 80-120  4. KETONES: Do you check for ketones (urine or blood test strips)? If yes, ask: What does the test show now?       no  5. TYPE 1 or 2:  Do you know what type of diabetes you have?  (e.g., Type 1, Type 2, Gestational; doesn't know)       Type 2   6. INSULIN: Do you take insulin? What type of insulin(s) do you use? What is the mode of delivery? (syringe, pen; injection or pump)?       Humilin R on sliding scale with syringe injection. Lantus 90 units at bedtime via syringe injection. Pt ran out of Lantus 09/15/19 and only got a partial 30 unit dose that night. Called pharmacy to refill and will not be available at pharmacy until 09/17/19 at 1300.   7. DIABETES PILLS: Do you take any pills for your diabetes? If yes, ask: Have you missed taking any pills recently?      Glipizide 20 mg twice daily  8. OTHER SYMPTOMS: Do you have any symptoms? (e.g., fever, frequent urination, difficulty breathing, dizziness, weakness, vomiting)      Frequent urination, urinary urgency, nauseous but no vomiting, headache present. No difficulty breathing. No dizziness, lightheadedness, or weakness. No fever   9. PREGNANCY: Is there any chance you are pregnant? When was your last menstrual period?  n/a    Protocols used: DIABETES - HIGH BLOOD SUGAR-A-AH

## 2019-09-17 NOTE — Unmapped (Signed)
Copied from CRM [id number]. Topic: HL - Nurse Triage - [facility] Contract Page  >> Sep 16, 2019 11:18 PM Arcola Jansky, RN wrote:  Please use the following dot phrase: .[email or web address]  [phone] [facility] re: Sagan Maselli [phone]     Source should be ON CALL PROVIDER   Subject should indicate PATIENT   Attach the Triage Encounter by clicking Attachments and under Patient Actions Clinical Call -click copy and copy all notes and Continue   Enter the name & time of page into the Provider Paging form each time the on call provider is paged  Route/Resolve ONLY when there is a return page or the call is completed using appropriate reason and close workspace    [phone] [facility] re: Farren Nelles [phone]    2234 On call provider paged  2250 On call provider paged  2305 On call provider paged  2315 Med. Director paged and given triage info and instructed patient go to ED  2316 CN called patient and gave instructions which he verbalized understanding.

## 2019-09-17 NOTE — Unmapped (Signed)
Pt reports that his BG was over 400 yesterday.   Last took full dose of Lantus about 2 days ago; states he ran out of this.

## 2019-09-17 NOTE — Unmapped (Signed)
[name] Provider Note    Patient Identification  Logan Quinn  Patient information was obtained from patient.  History/Exam limitations: none.    ED Clinical Impression     Final diagnoses:   Hyperglycemia (Primary)     Initial Impression, ED Course, Assessment and Plan     Impression: Logan Quinn is a 39 y.o. 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 Vital signs WNL. Exam otherwise unremarkable. Abdomen soft, nontender, no rebound or guarding. Cardiopulmonary exam benign.    Given story low concern for infectious etiology for hyperglycemia or DKA. This is likely a medication issue.    Plan for POCT glucose, basic labs, urine protein/creatinine ratio, urine protein, and VBG. Will give fluids and lantus.    Labs reassuring, no evidence of DKA.  After liter of fluid and Lantus, patient reports he is feeling much better.  Repeat glucose now high 200s.  He is comfortable plan for discharge, recheck sugars in the morning.  He still has his short acting insulin at home at this time.  He is comfortable that he will be able to obtain his Lantus before this evening.  Plan for discharge, return precautions given.        Additional Medical Decision Making     I independently visualized the radiology images.   I reviewed the patient's prior medical records.     Any labs and radiology results that were available during my care of the patient were independently reviewed by me and considered in my medical decision making.    Portions of this record have been created using Scientist, clinical (histocompatibility and immunogenetics). Dictation errors have been sought, but may not have been identified and corrected.  ____________________________________________    I have reviewed the triage vital signs and the nursing notes.      Patient History     Chief Complaint  Elevated Blood Glucose Symptomatic      HPI   Logan Quinn is a 39 y.o. 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 Patient reports that his blood sugars have been elevated in the past few days in the setting of not taking his Lantus because he ran out of it 2 days ago. He does endorse feeling slightly nauseated with some headaches earlier 2/2 elevated glucose levels. He has been drinking water and taking his insulin without significant improvement. He has last taken a half dose of Lantus two days ago. He called his PCP today regarding his elevated blood glucose and was sent here for fluids. Patient is normally on 90 units of lantus and will pick this up tomorrow. Denies fevers, chills, cough, congestion, shortness of breath, abdominal pain, urinary symptoms, or changes in bowel movements.    Past Medical History:   Diagnosis Date   ??? Acne    ??? Allergic    ??? Anxiety    ??? Depression    ??? Diabetes mellitus (CMS-HCC)    ??? GERD (gastroesophageal reflux disease)    ??? Gout    ??? Hypertension    ??? IBS (irritable bowel syndrome)    ???  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 Liver disease    ??? Migraines    ??? Morbid obesity with BMI of 60.0-69.9, adult (CMS-HCC)    ??? 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 Obstructive sleep apnea    ??? OSA on CPAP    ??? Severe obstructive sleep apnea    ??? Trapezius muscle strain 12/07/2013   ??? Urinary incontinence, nocturnal enuresis    ??? Venous insufficiency        Patient Active Problem List   Diagnosis   ??? Diabetes type 2, uncontrolled (CMS-HCC)   ??? Lesion of radial nerve   ??? Obstructive sleep apnea   ??? GERD (gastroesophageal reflux disease) (RAF-HCC)   ??? Depression (RAF-HCC)   ??? Suicidal ideation   ??? Morbid obesity with BMI of 50.0-59.9, adult (CMS-HCC)   ??? Abdominal pain   ??? Headache   ??? NAFLD (nonalcoholic fatty liver disease)   ??? Bleeding external hemorrhoids   ??? Anxiety   ??? Diarrhea   ??? Obesity   ??? Type 2 diabetes mellitus with hyperglycemia (CMS-HCC)   ??? Left shoulder pain   ??? Diabetes mellitus (CMS-HCC)   ??? Trapezius muscle strain   ??? Primary insomnia   ??? Obesity, morbid, BMI 50 or higher (CMS-HCC)   ??? Chronic joint pain   ??? Cellulitis of right lower extremity   ??? No diabetic retinopathy in both eyes   ??? Congenital hypertrophy of retinal pigment epithelium   ??? H/O retinal detachment   ??? IBS (irritable bowel syndrome)   ??? Urinary incontinence, nocturnal enuresis   ??? Severe obstructive sleep apnea   ??? OSA on CPAP   ??? Hidradenitis suppurativa       Past Surgical History:   Procedure Laterality Date   ??? EYE SURGERY  11/15   ??? PR COLONOSCOPY FLX DX W/COLLJ SPEC WHEN PFRMD N/A 03/24/2013    Procedure: COLONOSCOPY, FLEXIBLE, PROXIMAL TO SPLENIC FLEXURE; DIAGNOSTIC, W/WO COLLECTION SPECIMEN BY BRUSH OR WASH;  Surgeon: Clint Bolder, MD;  [facility];  Service: Gastroenterology   ??? PR EYE SURG POST SGMT PROC UNLISTED Left     pneumatic retinopexy OS   ??? PR UPPER GI ENDOSCOPY,DIAGNOSIS N/A 02/02/2013    Procedure: UGI ENDO, INCLUDE ESOPHAGUS, STOMACH, & DUODENUM &/OR JEJUNUM; DX W/WO COLLECTION SPECIMN, BY BRUSH OR WASH;  Surgeon: Malcolm Metro, MD;  [facility]: [facility];  Service: Gastroenterology   ??? [place] PYLORIC STENOSIS ([place] HISTORICAL RESULT)         No current facility-administered medications for this encounter.     Current Outpatient Medications:   ???  acetaminophen (TYLENOL) 500 MG tablet, Take 2 tablets (1,000 mg total) by mouth Three (3) times a day., Disp: 30 tablet, Rfl: 0  ???  ADVAIR DISKUS 500-50 mcg/dose diskus, Inhale 1 puff 2 (two) times a day., Disp: 13 Inhaler, Rfl: 1  ???  albuterol 2.5 mg /3 mL (0.083 %) nebulizer solution, Inhale 3 mL (2.5 mg total) by nebulization every four (4) hours as needed for wheezing., Disp: 25 vial, Rfl: 2 ???  albuterol HFA 90 mcg/actuation inhaler, Inhale 2 puffs every six (6) hours as needed for wheezing., Disp: 3 Inhaler, Rfl: 1  ???  ALPRAZolam (XANAX) 0.5 MG tablet, Take 1 tablet (0.5 mg total) by mouth Three (3) times a day. (Patient taking differently: Take 1 mg by mouth Three (3) times a day. Usually takes nightly for anxiety), Disp: 60 tablet, Rfl: 0  ???  blood sugar diagnostic Strp, by Other route Three (3) times a day before meals., Disp: 300 each, Rfl: 1  ???  blood-glucose meter kit, Use as directed, Disp: 1 each, Rfl: 0  ???  cephalexin (KEFLEX) 500 MG capsule, Take 1 capsule (500 mg total) by mouth Four (4) times a day., Disp: 40 capsule, Rfl: 0  ???  cetirizine (ZYRTEC) 10 MG tablet, Take 1 tablet (10 mg total) by mouth nightly as needed for allergies., Disp: 90 tablet, Rfl: 1  ???  chlorhexidine (PERIDEX) 0.12 % solution, 15 mL by Mouth route Two (2) times a day., Disp: 437 mL, Rfl: 0  ???  clindamycin (CLEOCIN T) 1 % external solution, Apply topically Two (2) times a day., Disp: 60 mL, Rfl: 4  ???  clobetasoL (TEMOVATE) 0.05 % ointment, Apply daily to painful affected areas if needed for flares, then stop, Disp: 15 g, Rfl: 1  ???  cyclobenzaprine (FLEXERIL) 10 MG tablet, Take 1 tablet (10 mg total) by mouth Three (3) times a day as needed for muscle spasms., Disp: 60 tablet, Rfl: 0  ???  dextroamphetamine-amphetamine (ADDERALL) 20 mg tablet, Take 1 tablet by mouth Two (2) times a day., Disp: , Rfl:   ???  dicyclomine (BENTYL) 10 mg capsule, Take 1 capsule (10 mg total) by mouth Three (3) times a day., Disp: 270 capsule, Rfl: 1  ???  diphenoxylate-atropine (LOMOTIL) 2.5-0.025 mg per tablet, Take 1 tablet by mouth two (2) times a day as needed for diarrhea., Disp: 30 tablet, Rfl: 5  ???  DULoxetine (CYMBALTA) 60 MG capsule, Take 60 mg by mouth Two (2) times a day., Disp: , Rfl:   ???  empty container (SHARPS CONTAINER) Misc, Use as directed to dispose of Humira needles, Disp: 1 each, Rfl: 2  ???  empty container Misc, Use as directed to dispose of Humira injections, Disp: 1 each, Rfl: 2  ???  erenumab-aooe 140 mg/mL AtIn, Inject 140 mg under the skin every twenty-eight (28) days., Disp: 1 mL, Rfl: 6  ???  ergocalciferol (DRISDOL) 1,250 mcg (50,000 unit) capsule, Take 1 capsule (50,000 Units total) by mouth once a week., Disp: 4 capsule, Rfl: 2  ???  famotidine (PEPCID) 20 MG tablet, Take 1 tablet (20 mg total) by mouth Two (2) times a day., Disp: 180 tablet, Rfl: 1  ???  fluticasone propionate (FLONASE) 50 mcg/actuation nasal spray, 1 spray by Each Nare route Two (2) times a day., Disp: 3 Bottle, Rfl: 3  ???  glipiZIDE (GLUCOTROL) 10 MG tablet, Take 2 tablets (20 mg total) by mouth Two (2) times a day (30 minutes before a meal)., Disp: 360 tablet, Rfl: 1  ???  HUMIRA PEN CITRATE FREE 40 MG/0.4 ML, Inject the contents of 2 pens (80mg  total) under the skin every seven (7) days., Disp: 8 each, Rfl: 3  ???  hydrocortisone (PROCTOSOL HC) 2.5 % rectal cream, Insert 1 application into the rectum two (2) times a day as needed., Disp: 28.35 g, Rfl: 2  ???  ibuprofen (ADVIL,MOTRIN) 800 MG tablet, Take 1 tablet (800 mg total) by mouth every eight (8) hours as needed., Disp: 90 tablet, Rfl: 1  ???  insulin glargine (LANTUS U-100 INSULIN) 100 unit/mL injection, Inject 0.9 mL (90 Units total) under the skin nightly., Disp: 90 mL, Rfl: 3  ???  insulin regular (HUMULIN R REGULAR U-100 INSULN) 100 unit/mL injection, SLIDING SCALE BLOOD SUGAR 0-150=0 UNITS,150-200=3 UNITS, 200-250=6 UNITS,250-300=9 UNITS, 300-350=12 UNITS, Disp: 40 mL, Rfl: 1  ???  insulin  syringe-needle U-100 (BD INSULIN SYRINGE ULTRA-FINE) 1 mL 31 gauge x 5/16 (8 mm) Syrg, Use up to 5 x daily to administer insulin., Disp: 100 each, Rfl: 1  ???  ketoconazole (NIZORAL) 2 % cream, Apply 1 application topically daily., Disp: 15 g, Rfl: 11  ???  lancets Misc, 1 each by Miscellaneous route Three (3) times a day before meals., Disp: 300 each, Rfl: 1  ???  levothyroxine (SYNTHROID) 50 MCG tablet, Take 1 tablet (50 mcg total) by mouth daily., Disp: 90 tablet, Rfl: 1  ???  lidocaine (XYLOCAINE) 5 % ointment, lidocaine 5 % topical ointment, Disp: , Rfl:   ???  miscellaneous medical supply Misc, Gauze pads 4 x 4 inches and paper tape, Disp: 30 each, Rfl: 0  ???  miscellaneous medical supply Misc, Nebulizer- Use as directed with albuterol nebs for SOB, Disp: 1 each, Rfl: 0  ???  MITIGARE 0.6 mg cap capsule, Take 1.2 mg on Day 1 of flare, followed by 0.6 mg one hour later. Day 2 take 0.6 mg BID until flare resolves., Disp: 60 capsule, Rfl: 3  ???  montelukast (SINGULAIR) 10 mg tablet, Take 1 tablet (10 mg total) by mouth daily., Disp: 90 each, Rfl: 1  ???  oxybutynin (DITROPAN) 5 MG tablet, Take 1 tablet (5 mg total) by mouth Two (2) times a day., Disp: 180 tablet, Rfl: 1  ???  polyethylene glycol (GLYCOLAX) 17 gram/dose powder, take 17GM (DISSOLVED IN WATER) by mouth once daily, Disp: , Rfl:   ???  PROAIR HFA 90 mcg/actuation inhaler, Inhale 2 puffs every four (4) hours as needed for wheezing., Disp: 3 Inhaler, Rfl: 1  ???  propranoloL (INDERAL LA) 120 mg 24 hr capsule, Take 1 capsule (120 mg total) by mouth daily., Disp: 90 capsule, Rfl: 1  ???  topiramate (TOPAMAX) 50 MG tablet, Take 1.5 tablets (75 mg total) by mouth Two (2) times a day., Disp: 270 tablet, Rfl: 1  ???  traZODone (DESYREL) 100 MG tablet, take 2 tablets by mouth at bedtime, Disp: , Rfl: 0    Allergies  Erythromycin, Penicillins, Sulfa (sulfonamide antibiotics), Other, Erythromycin base, and Sulfasalazine    Family History   Problem Relation Age of Onset   ??? Cancer Maternal Grandfather    ??? Hearing loss Maternal Grandfather    ??? Cancer Paternal Grandfather    ??? COPD Paternal Grandmother    ??? Arthritis Paternal Grandmother    ??? Depression Paternal Grandmother    ??? Diabetes Mother    ??? Heart disease Mother    ??? Migraines Mother    ??? Arthritis Mother    ??? Depression Mother    ??? GER disease Mother    ??? Hypertension Mother    ??? Angina Mother    ??? COPD Mother    ??? Glaucoma Mother    ??? Diabetes Sister    ??? Migraines Sister    ??? Asthma Sister    ??? Depression Sister    ??? Diabetes Brother    ??? Asthma Brother    ??? Diabetes Maternal Grandmother    ??? Heart disease Maternal Grandmother    ??? Migraines Maternal Grandmother    ??? Depression Maternal Grandmother    ??? Angina Maternal Grandmother    ??? Hypertension Maternal Grandmother    ??? Stroke Maternal Grandmother    ??? Diabetes Maternal Uncle    ??? Diabetes Maternal Uncle    ??? Liver disease Maternal Uncle    ??? Kidney disease Maternal Uncle    ???  Asthma Brother    ??? Colorectal Cancer Neg Hx    ??? Esophageal cancer Neg Hx    ??? Liver cancer Neg Hx    ??? Pancreatic cancer Neg Hx    ??? Stomach cancer Neg Hx    ??? Amblyopia Neg Hx    ??? Blindness Neg Hx    ??? Retinal detachment Neg Hx    ??? Strabismus Neg Hx    ??? Macular degeneration Neg Hx    ??? Anesthesia problems Neg Hx    ??? Broken bones Neg Hx    ??? Clotting disorder Neg Hx    ??? Collagen disease Neg Hx    ??? Dislocations Neg Hx    ??? Fibromyalgia Neg Hx    ??? Gout Neg Hx    ??? Hemophilia Neg Hx    ??? Osteoporosis Neg Hx    ??? Rheumatologic disease Neg Hx    ??? Scoliosis Neg Hx    ??? Severe sprains Neg Hx    ??? Sickle cell anemia Neg Hx    ??? Spinal Compression Fracture Neg Hx    ??? Melanoma Neg Hx    ??? Basal cell carcinoma Neg Hx    ??? Squamous cell carcinoma Neg Hx    ??? Deep vein thrombosis Neg Hx        Social History  Social History     Tobacco Use   ??? Smoking status: Never Smoker   ??? Smokeless tobacco: Never Used   Substance Use Topics   ??? Alcohol use: Never     Alcohol/week: 0.0 standard drinks     Comment: rare social   ??? Drug use: Never       Review of Systems  General: no fevers, chills  Cardiac: no CP, SOB, palpitations  Pulmonary: no cough, sputum production, hemoptysis  A 10 point review of systems was negative except for those previously mentioned in the HPI and above.    Physical Exam     ED Triage Vitals [09/17/19 0020]   Enc Vitals Group      BP 143/86      Heart Rate 93      SpO2 Pulse       Resp 18      Temp 36.8 ??C (98.2 ??F)      Temp Source Oral      SpO2 92 %      Weight (!) 230.4 kg (508 lb)      Height      Constitutional: alert and cooperative in NAD  ENT:       Head: [place]/AT       Eyes: PERRL, conjunctiva clear, sclera anicteric       Nose: no congestion       Mouth/Throat: MM moist       Neck: supple, midline trachea, no tenderness or cervical adenopathy  Cardiac: RRR, normal S1, S2, no appreciable murmurs or rubs. Distal pulses present and symmetrical B/L  Respiratory: CTA bilaterally, non-labored breathing, no stridor, wheezing or crackles  Abdomen: soft, NT, normal BS. Obese abdomen. No masses, rebound or guarding  Extremities: LE normal with no CCE, symmetric in size and NTTP  Skin: warm and dry, no rashes or lesions  Neurologic: no gross focal neurologic deficits appreciated  Psychiatric: normal mood, affect, speech and behavior      Documentation assistance was provided by Mliss Sax, Scribe, on September 17, 2019 at 12:56 AM for Irven Baltimore, MD.    Documentation assistance was provided by the  scribe in my presence.  The documentation recorded by the scribe has been reviewed by me and accurately reflects the services I personally performed.           Harriet Pho, MD  09/17/19 [phone]

## 2019-09-20 ENCOUNTER — Ambulatory Visit: Payer: Medicaid Other | Admitting: Podiatry

## 2019-09-20 NOTE — Unmapped (Signed)
ED TRANSITIONS OF CARE  SUMMARY NOTE     Attempted to contact pt today at Home to complete ED Transitions of Care call. Left message to return call. Will send letter as well.      Rebeckah Masih Toya Smothers, RN

## 2019-09-21 MED FILL — EMPTY CONTAINER: 60 days supply | Qty: 1 | Fill #2

## 2019-09-21 MED FILL — HUMIRA PEN CITRATE FREE 40 MG/0.4 ML: 28 days supply | Qty: 8 | Fill #2

## 2019-09-21 MED FILL — EMPTY CONTAINER: 60 days supply | Qty: 1 | Fill #2 | Status: AC

## 2019-09-21 MED FILL — HUMIRA PEN CITRATE FREE 40 MG/0.4 ML: 28 days supply | Qty: 8 | Fill #2 | Status: AC

## 2019-09-21 NOTE — Unmapped (Signed)
Reviewed urine protein/albumin results from ER.

## 2019-09-27 MED ORDER — INSULIN SYRINGE U-100 WITH NEEDLE 1 ML 31 GAUGE X 5/16" (8 MM)
1 refills | 0 days | Status: CP
Start: 2019-09-27 — End: 2020-09-26

## 2019-09-27 NOTE — Unmapped (Signed)
Logan Quinn is a 39 y.o. male who presents for Transition Care Management visit.   Patient came to appointment alone.    Assessment/Plan:         Logan Quinn was seen today for hospitalization follow-up.    Diagnoses and all orders for this visit:    Type 2 diabetes mellitus with hyperglycemia, with long-term current use of insulin (CMS-HCC)  Recent ED visit due to elevated BGs. Improved since discharge. Continue glipizide 20 mg BID, Lantus 90 units HS and Humulin as directed with sliding scale.  -     insulin regular (HUMULIN R REGULAR U-100 INSULN) 100 unit/mL injection; SLIDING SCALE BLOOD SUGAR 0-150=0 UNITS,150-200=3 UNITS, 200-250=6 UNITS,250-300=9 UNITS, 300-350=12 UNITS    Other orders  Patient in process of finding new psychiatrist. PCP agreeable to provide Rx bridge of psych meds, per orders, for 1-2 months.   -     traZODone (DESYREL) 100 MG tablet; Take 2 tablets (200 mg total) by mouth nightly.  -     DULoxetine (CYMBALTA) 60 MG capsule; Take 1 capsule (60 mg total) by mouth Two (2) times a day.  -     dextroamphetamine-amphetamine (ADDERALL) 20 mg tablet; Take 1 tablet by mouth Two (2) times a day.  -     ALPRAZolam (XANAX) 0.5 MG tablet; Take 1 tablet (0.5 mg total) by mouth Three (3) times a day.    Medical decision making was of moderate [phone]- must be seen within 14 days) complexity.    Durable medical equipment ordered: none    Barriers to recommended plan: None identified    Community resources identified for patient/family: None       Subjective:            Post discharge interactive communication via telephone was made with patient on 09/20/19 and I have reviewed the information from that communication.    HPI  Logan Quinn was seen at the [facility] ED on 09/17/19 for hyperglycemia. Discharge summary reviewed. Per review of ED note, patient presented to ED for mild nausea and headaches in setting of not taking Lantus x2 days after running out of it. Initial glucose 374. Labs were reassuring, no evidence of DKA. He was given 1 L of fluid and symptoms improved. Repeat glucose 298. Given improved symptoms, improved BGs, and patient being comfortable that he would be able to obtain his Lantus before that evening - patient was discharged.    Patient was able to get all new medications after being discharged: yes.  New symptoms since discharge:no. Patient has needed Home Health since discharge: no.     Patient reports BGs were ranging in 450s prior to ED visit. He states he was unable to pick up Lantus Rx because it was out of pharmacy stock. Since discharge and resuming Lantus, BGs have improved and symptoms have resolved. Current BG reading 159 (by Dexacom). Current treatment: Lantus 90 units HS and Humulin on sliding scale dosage. He states he rarely needs 12 units.     He requests medication bridge of psych meds until he is able to find new psychiatrist. Medications needed: adderall, trazodone, duloxetine, alprazolam. He was previously following with [facility], however his wife was released from the clinic due to several missed visits. He states he called the clinic to discuss with the practice manager, but became frustrated and ultimately decided to transfer care. He is working with Medicaid to find new specialist.      ROS  Constitutional:  Denies  unexpected weight loss or gain, or weakness  Eyes:  Denies visual changes  Respiratory:  Denies cough or shortness of breath. No change in exercise tolerance  Cardiovascular:  Denies chest pain, palpitations or lower extremity swelling  GI:  Denies abdominal pain, diarrhea, constipation  Musculoskeletal:  Denies myalgias  Skin:  Denies nonhealing lesions  Neurologic:  Denies headache, focal weakness or numbness, tingling  Endocrine:  Denies polyuria or polydipsia  Psychiatric:  Denies depression, anxiety    Outpatient Medications Prior to Visit   Medication Sig Dispense Refill   ??? ADVAIR DISKUS 500-50 mcg/dose diskus Inhale 1 puff 2 (two) times a day. 13 Inhaler 1   ??? albuterol 2.5 mg /3 mL (0.083 %) nebulizer solution Inhale 3 mL (2.5 mg total) by nebulization every four (4) hours as needed for wheezing. 25 vial 2   ??? albuterol HFA 90 mcg/actuation inhaler Inhale 2 puffs every six (6) hours as needed for wheezing. 3 Inhaler 1   ??? blood sugar diagnostic Strp by Other route Three (3) times a day before meals. 300 each 1   ??? blood-glucose meter kit Use as directed 1 each 0   ??? cephalexin (KEFLEX) 500 MG capsule Take 1 capsule (500 mg total) by mouth Four (4) times a day. 40 capsule 0   ??? cetirizine (ZYRTEC) 10 MG tablet Take 1 tablet (10 mg total) by mouth nightly as needed for allergies. 90 tablet 1   ??? chlorhexidine (PERIDEX) 0.12 % solution 15 mL by Mouth route Two (2) times a day. 437 mL 0   ??? clindamycin (CLEOCIN T) 1 % external solution Apply topically Two (2) times a day. 60 mL 4   ??? clobetasoL (TEMOVATE) 0.05 % ointment Apply daily to painful affected areas if needed for flares, then stop 15 g 1   ??? cyclobenzaprine (FLEXERIL) 10 MG tablet Take 1 tablet (10 mg total) by mouth Three (3) times a day as needed for muscle spasms. 60 tablet 0   ??? dicyclomine (BENTYL) 10 mg capsule Take 1 capsule (10 mg total) by mouth Three (3) times a day. 270 capsule 1   ??? diphenoxylate-atropine (LOMOTIL) 2.5-0.025 mg per tablet Take 1 tablet by mouth two (2) times a day as needed for diarrhea. 30 tablet 5   ??? empty container (SHARPS CONTAINER) Misc Use as directed to dispose of Humira needles 1 each 2   ??? empty container Misc Use as directed to dispose of Humira injections 1 each 2   ??? erenumab-aooe 140 mg/mL AtIn Inject 140 mg under the skin every twenty-eight (28) days. 1 mL 6   ??? ergocalciferol (DRISDOL) 1,250 mcg (50,000 unit) capsule Take 1 capsule (50,000 Units total) by mouth once a week. 4 capsule 2   ??? famotidine (PEPCID) 20 MG tablet Take 1 tablet (20 mg total) by mouth Two (2) times a day. 180 tablet 1   ??? fluticasone propionate (FLONASE) 50 mcg/actuation nasal spray 1 spray by Each Nare route Two (2) times a day. 3 Bottle 3   ??? glipiZIDE (GLUCOTROL) 10 MG tablet Take 2 tablets (20 mg total) by mouth Two (2) times a day (30 minutes before a meal). 360 tablet 1   ??? HUMIRA PEN CITRATE FREE 40 MG/0.4 ML Inject the contents of 2 pens (80mg  total) under the skin every seven (7) days. 8 each 3   ??? hydrocortisone (PROCTOSOL HC) 2.5 % rectal cream Insert 1 application into the rectum two (2) times a day as  needed. 28.35 g 2   ??? ibuprofen (ADVIL,MOTRIN) 800 MG tablet Take 1 tablet (800 mg total) by mouth every eight (8) hours as needed. 90 tablet 1   ??? insulin glargine (LANTUS U-100 INSULIN) 100 unit/mL injection Inject 0.9 mL (90 Units total) under the skin nightly. 90 mL 3   ??? insulin syringe-needle U-100 (BD INSULIN SYRINGE ULTRA-FINE) 1 mL 31 gauge x 5/16 (8 mm) Syrg Use up to 5 x daily to administer insulin. 100 each 1   ??? ketoconazole (NIZORAL) 2 % cream Apply 1 application topically daily. 15 g 11   ??? lancets Misc 1 each by Miscellaneous route Three (3) times a day before meals. 300 each 1   ??? levothyroxine (SYNTHROID) 50 MCG tablet Take 1 tablet (50 mcg total) by mouth daily. 90 tablet 1   ??? lidocaine (XYLOCAINE) 5 % ointment lidocaine 5 % topical ointment     ??? miscellaneous medical supply Misc Gauze pads 4 x 4 inches and paper tape 30 each 0   ??? miscellaneous medical supply Misc Nebulizer- Use as directed with albuterol nebs for SOB 1 each 0   ??? MITIGARE 0.6 mg cap capsule Take 1.2 mg on Day 1 of flare, followed by 0.6 mg one hour later. Day 2 take 0.6 mg BID until flare resolves. 60 capsule 3   ??? montelukast (SINGULAIR) 10 mg tablet Take 1 tablet (10 mg total) by mouth daily. 90 each 1   ??? oxybutynin (DITROPAN) 5 MG tablet Take 1 tablet (5 mg total) by mouth Two (2) times a day. 180 tablet 1   ??? polyethylene glycol (GLYCOLAX) 17 gram/dose powder take 17GM (DISSOLVED IN WATER) by mouth once daily     ??? PROAIR HFA 90 mcg/actuation inhaler Inhale 2 puffs every four (4) hours as needed for wheezing. 3 Inhaler 1   ??? propranoloL (INDERAL LA) 120 mg 24 hr capsule Take 1 capsule (120 mg total) by mouth daily. 90 capsule 1   ??? sour cherry extract (TART CHERRY EXTRACT) 1,000 mg cap      ??? topiramate (TOPAMAX) 50 MG tablet Take 1.5 tablets (75 mg total) by mouth Two (2) times a day. 270 tablet 1   ??? ALPRAZolam (XANAX) 0.5 MG tablet Take 1 tablet (0.5 mg total) by mouth Three (3) times a day. (Patient taking differently: Take 1 mg by mouth Three (3) times a day. Usually takes nightly for anxiety) 60 tablet 0   ??? dextroamphetamine-amphetamine (ADDERALL) 20 mg tablet Take 1 tablet by mouth Two (2) times a day.     ??? DULoxetine (CYMBALTA) 60 MG capsule Take 60 mg by mouth Two (2) times a day.     ??? insulin regular (HUMULIN R REGULAR U-100 INSULN) 100 unit/mL injection SLIDING SCALE BLOOD SUGAR 0-150=0 UNITS,150-200=3 UNITS, 200-250=6 UNITS,250-300=9 UNITS, 300-350=12 UNITS 40 mL 1   ??? traZODone (DESYREL) 100 MG tablet take 2 tablets by mouth at bedtime  0   ??? acetaminophen (TYLENOL) 500 MG tablet Take 2 tablets (1,000 mg total) by mouth Three (3) times a day. 30 tablet 0     No facility-administered medications prior to visit.       Medications prescribed or ordered upon discharge were reviewed and reconciled with the most recent outpatient medication list. I have reviewed and agree with this medication reconciliation.       The following portions of the patient's history were reviewed and updated as appropriate: allergies, current medications, past family history, past medical history, past social history,  past surgical history and problem list.       [facility] Components:           Goals     ??? Self- Management Goal        Things to think about to help me reach my goal:     What are you going to do? Increase activity/motivation   How and how much? Clean up and do something constructive   How frequent? 2 x week    Barriers to success? fatigue   Solutions to barriers? Going to sleep earlier          ??? Self- Management Goal        Things to think about to help me reach my goal:     What are you going to do? Continue physical activity    How and how much? Fixing house    How frequent? daily   Barriers to success?    Solutions to barriers?                Medication adherence and barriers to the treatment plan have been addressed. Opportunities to optimize healthy behaviors have been discussed. Patient / caregiver voiced understanding.      Past Medical/Surgical History:           Past Medical History:   Diagnosis Date   ??? Acne    ??? Allergic    ??? Anxiety    ??? Depression    ??? Diabetes mellitus (CMS-HCC)    ??? GERD (gastroesophageal reflux disease)    ??? Gout    ??? Hypertension    ??? IBS (irritable bowel syndrome)    ??? 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 Liver disease    ??? Migraines    ??? Morbid obesity with BMI of 60.0-69.9, adult (CMS-HCC)    ??? 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 Obstructive sleep apnea    ??? OSA on CPAP    ??? Severe obstructive sleep apnea    ??? Trapezius muscle strain 12/07/2013   ??? Urinary incontinence, nocturnal enuresis    ??? Venous insufficiency      Past Surgical History:   Procedure Laterality Date   ??? EYE SURGERY  11/15   ??? PR COLONOSCOPY FLX DX W/COLLJ SPEC WHEN PFRMD N/A 03/24/2013    Procedure: COLONOSCOPY, FLEXIBLE, PROXIMAL TO SPLENIC FLEXURE; DIAGNOSTIC, W/WO COLLECTION SPECIMEN BY BRUSH OR WASH;  Surgeon: Clint Bolder, MD;  [facility];  Service: Gastroenterology   ??? PR EYE SURG POST SGMT PROC UNLISTED Left     pneumatic retinopexy OS   ??? PR UPPER GI ENDOSCOPY,DIAGNOSIS N/A 02/02/2013    Procedure: UGI ENDO, INCLUDE ESOPHAGUS, STOMACH, & DUODENUM &/OR JEJUNUM; DX W/WO COLLECTION SPECIMN, BY BRUSH OR WASH;  Surgeon: Malcolm Metro, MD;  [facility]: [facility];  Service: Gastroenterology   ??? [place] PYLORIC STENOSIS ( HISTORICAL RESULT)         Family History:           Family History   Problem Relation Age of Onset   ??? Cancer Maternal Grandfather    ??? Hearing loss Maternal Grandfather    ??? Cancer Paternal Grandfather    ??? COPD Paternal Grandmother    ??? Arthritis Paternal Grandmother    ??? Depression Paternal Grandmother    ??? Diabetes Mother    ??? Heart disease Mother    ??? Migraines Mother    ??? Arthritis Mother    ???  Depression Mother    ??? GER disease Mother    ??? Hypertension Mother    ??? Angina Mother    ??? COPD Mother    ??? Glaucoma Mother    ??? Hearing loss Mother    ??? Diabetes Sister    ??? Migraines Sister    ??? Asthma Sister    ??? Depression Sister    ??? Hearing loss Sister    ??? Diabetes Brother    ??? Asthma Brother    ??? Diabetes Maternal Grandmother    ??? Heart disease Maternal Grandmother    ??? Migraines Maternal Grandmother    ??? Depression Maternal Grandmother    ??? Angina Maternal Grandmother    ??? Hypertension Maternal Grandmother    ??? Stroke Maternal Grandmother    ??? Diabetes Maternal Uncle    ??? Hearing loss Maternal Uncle    ??? Diabetes Maternal Uncle    ??? Liver disease Maternal Uncle    ??? Kidney disease Maternal Uncle    ??? Asthma Brother    ??? Colorectal Cancer Neg Hx    ??? Esophageal cancer Neg Hx    ??? Liver cancer Neg Hx    ??? Pancreatic cancer Neg Hx    ??? Stomach cancer Neg Hx    ??? Amblyopia Neg Hx    ??? Blindness Neg Hx    ??? Retinal detachment Neg Hx    ??? Strabismus Neg Hx    ??? Macular degeneration Neg Hx    ??? Anesthesia problems Neg Hx    ??? Broken bones Neg Hx    ??? Clotting disorder Neg Hx    ??? Collagen disease Neg Hx    ??? Dislocations Neg Hx    ??? Fibromyalgia Neg Hx    ??? Gout Neg Hx    ??? Hemophilia Neg Hx    ??? Osteoporosis Neg Hx    ??? Rheumatologic disease Neg Hx    ??? Scoliosis Neg Hx    ??? Severe sprains Neg Hx    ??? Sickle cell anemia Neg Hx    ??? Spinal Compression Fracture Neg Hx    ??? Melanoma Neg Hx    ??? Basal cell carcinoma Neg Hx    ??? Squamous cell carcinoma Neg Hx    ??? Deep vein thrombosis Neg Hx        Social History:           Social History     Socioeconomic History   ??? Marital status: Married     Spouse name: None   ??? Number of children: None   ??? Years of education: None   ??? Highest education level: None   Occupational History   ??? None   Tobacco Use   ??? Smoking status: Never Smoker   ??? Smokeless tobacco: Never Used   Vaping Use   ??? Vaping Use: Never used   Substance and Sexual Activity   ??? Alcohol use: Never     Alcohol/week: 0.0 standard drinks     Comment: rare social   ??? Drug use: Never   ??? Sexual activity: Yes     Partners: Female     Birth control/protection: None   Other Topics Concern   ??? Do you use sunscreen? Yes   ??? Tanning bed use? No   ??? Are you easily burned? No   ??? Excessive sun exposure? No   ??? Blistering sunburns? No   Social History Narrative   ??? None  Social Determinants of Health     Financial Resource Strain:    ??? Difficulty of Paying Living Expenses:    Food Insecurity:    ??? Worried About Programme researcher, broadcasting/film/video in the Last Year:    ??? Barista in the Last Year:    Transportation Needs:    ??? Freight forwarder (Medical):    ??? Lack of Transportation (Non-Medical):    Physical Activity:    ??? Days of Exercise per Week:    ??? Minutes of Exercise per Session:    Stress:    ??? Feeling of Stress :    Social Connections:    ??? Frequency of Communication with Friends and Family:    ??? Frequency of Social Gatherings with Friends and Family:    ??? Attends Religious Services:    ??? Database administrator or Organizations:    ??? Attends Engineer, structural:    ??? Marital Status:        Allergies:           Erythromycin, Penicillins, Sulfa (sulfonamide antibiotics), Other, Erythromycin base, and Sulfasalazine    Current Medications           Current Outpatient Medications   Medication Sig Dispense Refill   ??? ADVAIR DISKUS 500-50 mcg/dose diskus Inhale 1 puff 2 (two) times a day. 13 Inhaler 1   ??? albuterol 2.5 mg /3 mL (0.083 %) nebulizer solution Inhale 3 mL (2.5 mg total) by nebulization every four (4) hours as needed for wheezing. 25 vial 2   ??? albuterol HFA 90 mcg/actuation inhaler Inhale 2 puffs every six (6) hours as needed for wheezing. 3 Inhaler 1   ??? ALPRAZolam (XANAX) 0.5 MG tablet Take 1 tablet (0.5 mg total) by mouth Three (3) times a day. 90 tablet 1   ??? blood sugar diagnostic Strp by Other route Three (3) times a day before meals. 300 each 1   ??? blood-glucose meter kit Use as directed 1 each 0   ??? cephalexin (KEFLEX) 500 MG capsule Take 1 capsule (500 mg total) by mouth Four (4) times a day. 40 capsule 0   ??? cetirizine (ZYRTEC) 10 MG tablet Take 1 tablet (10 mg total) by mouth nightly as needed for allergies. 90 tablet 1   ??? chlorhexidine (PERIDEX) 0.12 % solution 15 mL by Mouth route Two (2) times a day. 437 mL 0   ??? clindamycin (CLEOCIN T) 1 % external solution Apply topically Two (2) times a day. 60 mL 4   ??? clobetasoL (TEMOVATE) 0.05 % ointment Apply daily to painful affected areas if needed for flares, then stop 15 g 1   ??? cyclobenzaprine (FLEXERIL) 10 MG tablet Take 1 tablet (10 mg total) by mouth Three (3) times a day as needed for muscle spasms. 60 tablet 0   ??? dextroamphetamine-amphetamine (ADDERALL) 20 mg tablet Take 1 tablet by mouth Two (2) times a day. 30 tablet 0   ??? dicyclomine (BENTYL) 10 mg capsule Take 1 capsule (10 mg total) by mouth Three (3) times a day. 270 capsule 1   ??? diphenoxylate-atropine (LOMOTIL) 2.5-0.025 mg per tablet Take 1 tablet by mouth two (2) times a day as needed for diarrhea. 30 tablet 5   ??? DULoxetine (CYMBALTA) 60 MG capsule Take 1 capsule (60 mg total) by mouth Two (2) times a day. 180 capsule 0   ??? empty container (SHARPS CONTAINER) Misc Use as directed to dispose of Humira needles 1  each 2   ??? empty container Misc Use as directed to dispose of Humira injections 1 each 2   ??? erenumab-aooe 140 mg/mL AtIn Inject 140 mg under the skin every twenty-eight (28) days. 1 mL 6   ??? ergocalciferol (DRISDOL) 1,250 mcg (50,000 unit) capsule Take 1 capsule (50,000 Units total) by mouth once a week. 4 capsule 2   ??? famotidine (PEPCID) 20 MG tablet Take 1 tablet (20 mg total) by mouth Two (2) times a day. 180 tablet 1   ??? fluticasone propionate (FLONASE) 50 mcg/actuation nasal spray 1 spray by Each Nare route Two (2) times a day. 3 Bottle 3   ??? glipiZIDE (GLUCOTROL) 10 MG tablet Take 2 tablets (20 mg total) by mouth Two (2) times a day (30 minutes before a meal). 360 tablet 1   ??? HUMIRA PEN CITRATE FREE 40 MG/0.4 ML Inject the contents of 2 pens (80mg  total) under the skin every seven (7) days. 8 each 3   ??? hydrocortisone (PROCTOSOL HC) 2.5 % rectal cream Insert 1 application into the rectum two (2) times a day as needed. 28.35 g 2   ??? ibuprofen (ADVIL,MOTRIN) 800 MG tablet Take 1 tablet (800 mg total) by mouth every eight (8) hours as needed. 90 tablet 1   ??? insulin glargine (LANTUS U-100 INSULIN) 100 unit/mL injection Inject 0.9 mL (90 Units total) under the skin nightly. 90 mL 3   ??? insulin regular (HUMULIN R REGULAR U-100 INSULN) 100 unit/mL injection SLIDING SCALE BLOOD SUGAR 0-150=0 UNITS,150-200=3 UNITS, 200-250=6 UNITS,250-300=9 UNITS, 300-350=12 UNITS 40 mL 3   ??? insulin syringe-needle U-100 (BD INSULIN SYRINGE ULTRA-FINE) 1 mL 31 gauge x 5/16 (8 mm) Syrg Use up to 5 x daily to administer insulin. 100 each 1   ??? ketoconazole (NIZORAL) 2 % cream Apply 1 application topically daily. 15 g 11   ??? lancets Misc 1 each by Miscellaneous route Three (3) times a day before meals. 300 each 1   ??? levothyroxine (SYNTHROID) 50 MCG tablet Take 1 tablet (50 mcg total) by mouth daily. 90 tablet 1   ??? lidocaine (XYLOCAINE) 5 % ointment lidocaine 5 % topical ointment     ??? miscellaneous medical supply Misc Gauze pads 4 x 4 inches and paper tape 30 each 0   ??? miscellaneous medical supply Misc Nebulizer- Use as directed with albuterol nebs for SOB 1 each 0   ??? MITIGARE 0.6 mg cap capsule Take 1.2 mg on Day 1 of flare, followed by 0.6 mg one hour later. Day 2 take 0.6 mg BID until flare resolves. 60 capsule 3   ??? montelukast (SINGULAIR) 10 mg tablet Take 1 tablet (10 mg total) by mouth daily. 90 each 1   ??? oxybutynin (DITROPAN) 5 MG tablet Take 1 tablet (5 mg total) by mouth Two (2) times a day. 180 tablet 1   ??? polyethylene glycol (GLYCOLAX) 17 gram/dose powder take 17GM (DISSOLVED IN WATER) by mouth once daily     ??? PROAIR HFA 90 mcg/actuation inhaler Inhale 2 puffs every four (4) hours as needed for wheezing. 3 Inhaler 1   ??? propranoloL (INDERAL LA) 120 mg 24 hr capsule Take 1 capsule (120 mg total) by mouth daily. 90 capsule 1   ??? sour cherry extract (TART CHERRY EXTRACT) 1,000 mg cap      ??? topiramate (TOPAMAX) 50 MG tablet Take 1.5 tablets (75 mg total) by mouth Two (2) times a day. 270 tablet 1   ??? traZODone (DESYREL) 100  MG tablet Take 2 tablets (200 mg total) by mouth nightly. 180 tablet 0   ??? acetaminophen (TYLENOL) 500 MG tablet Take 2 tablets (1,000 mg total) by mouth Three (3) times a day. 30 tablet 0     No current facility-administered medications for this visit.          Objective:           Vital Signs  Wt Readings from Last 3 Encounters:   09/29/19 (!) 228.2 kg (503 lb)   09/17/19 (!) 230.4 kg (508 lb)   08/18/19 (!) 230.4 kg (508 lb)     Temp Readings from Last 3 Encounters:   09/29/19 36.6 ??C (97.8 ??F) (Oral)   09/17/19 36.7 ??C (98 ??F) (Oral)   08/18/19 36.8 ??C (98.2 ??F) (Oral)     BP Readings from Last 3 Encounters:   09/29/19 124/66   09/17/19 128/61   08/18/19 114/70     Pulse Readings from Last 3 Encounters:   09/29/19 61   09/17/19 80   08/18/19 67     Estimated body mass index is 70.19 kg/m?? as calculated from the following:    Height as of this encounter: 180.3 cm (5' 10.98).    Weight as of this encounter: 228.2 kg (503 lb).  Facility age limit for growth percentiles is 20 years.      Physical Exam  General: Well developed. Well nourished. No marked weight fluctuation. Obese.  HEENT:  Normocephalic.  Atraumatic.     Neck:  No thyroid enlargement, carotid bruits  Heart:  Regular rate and rhythm . No murmurs or gallops  Lungs:  No respiratory distress.  Lungs clear to auscultation. Extremities:  No edema. Peripheral pulses normal  Skin:  Warm, dry, no nonhealing lesions  Neuro:  Non-focal. No obvious weakness.   Psych:  Affect normal, eye contact good, speech clear and coherent.        I attest that I, Bayard Hugger, personally documented this note while acting as scribe for [name], FNP.      Bayard Hugger, Scribe.  09/29/2019     The documentation recorded by the scribe accurately reflects the service I personally performed and the decisions made by me.    [name], FNP

## 2019-09-29 ENCOUNTER — Ambulatory Visit: Admit: 2019-09-29 | Discharge: 2019-09-30 | Payer: MEDICAID | Attending: Family | Primary: Family

## 2019-09-29 DIAGNOSIS — Z794 Long term (current) use of insulin: Principal | ICD-10-CM

## 2019-09-29 DIAGNOSIS — E1165 Type 2 diabetes mellitus with hyperglycemia: Principal | ICD-10-CM

## 2019-09-29 MED ORDER — DULOXETINE 60 MG CAPSULE,DELAYED RELEASE
ORAL_CAPSULE | Freq: Two times a day (BID) | ORAL | 0 refills | 90 days | Status: CP
Start: 2019-09-29 — End: ?

## 2019-09-29 MED ORDER — DEXTROAMPHETAMINE-AMPHETAMINE 20 MG TABLET
ORAL_TABLET | Freq: Two times a day (BID) | ORAL | 0 refills | 15 days | Status: CP
Start: 2019-09-29 — End: ?

## 2019-09-29 MED ORDER — INSULIN U-100 REGULAR HUMAN 100 UNIT/ML INJECTION SOLUTION
3 refills | 0 days | Status: CP
Start: 2019-09-29 — End: 2020-09-28

## 2019-09-29 MED ORDER — TRAZODONE 100 MG TABLET
ORAL_TABLET | Freq: Every evening | ORAL | 0 refills | 90.00000 days | Status: CP
Start: 2019-09-29 — End: ?

## 2019-09-29 MED ORDER — ALPRAZOLAM 0.5 MG TABLET
ORAL_TABLET | Freq: Three times a day (TID) | ORAL | 1 refills | 30.00000 days | Status: CP
Start: 2019-09-29 — End: ?

## 2019-10-14 NOTE — Unmapped (Signed)
Logan Quinn reports things are stable with his Humira. He has continued to have some flares, but feels the Humira has helped. He reports he continues to take keflex QID, and he has a f/u appointment with dermatology next week on 5/12.    I offered he could ask about the new 80 mg pens if he would like to reduce needle sticks.       [facility] Clinical Assessment & Refill Coordination Note    Logan Quinn, DOB: 1981-01-18  Phone: [phone] (home)     All above HIPAA information was verified with patient.     Was a Nurse, learning disability used for this call? No    Specialty Medication(s):   Inflammatory Disorders: Humira     Current Outpatient Medications   Medication Sig Dispense Refill   ??? acetaminophen (TYLENOL) 500 MG tablet Take 2 tablets (1,000 mg total) by mouth Three (3) times a day. 30 tablet 0   ??? ADVAIR DISKUS 500-50 mcg/dose diskus Inhale 1 puff 2 (two) times a day. 13 Inhaler 1   ??? albuterol 2.5 mg /3 mL (0.083 %) nebulizer solution Inhale 3 mL (2.5 mg total) by nebulization every four (4) hours as needed for wheezing. 25 vial 2   ??? albuterol HFA 90 mcg/actuation inhaler Inhale 2 puffs every six (6) hours as needed for wheezing. 3 Inhaler 1   ??? ALPRAZolam (XANAX) 0.5 MG tablet Take 1 tablet (0.5 mg total) by mouth Three (3) times a day. 90 tablet 1   ??? blood sugar diagnostic Strp by Other route Three (3) times a day before meals. 300 each 1   ??? blood-glucose meter kit Use as directed 1 each 0   ??? cephalexin (KEFLEX) 500 MG capsule Take 1 capsule (500 mg total) by mouth Four (4) times a day. 40 capsule 0   ??? cetirizine (ZYRTEC) 10 MG tablet Take 1 tablet (10 mg total) by mouth nightly as needed for allergies. 90 tablet 1   ??? chlorhexidine (PERIDEX) 0.12 % solution 15 mL by Mouth route Two (2) times a day. 437 mL 0   ??? clindamycin (CLEOCIN T) 1 % external solution Apply topically Two (2) times a day. 60 mL 4   ??? clobetasoL (TEMOVATE) 0.05 % ointment Apply daily to painful affected areas if needed for flares, then stop 15 g 1   ??? cyclobenzaprine (FLEXERIL) 10 MG tablet Take 1 tablet (10 mg total) by mouth Three (3) times a day as needed for muscle spasms. 60 tablet 0   ??? dextroamphetamine-amphetamine (ADDERALL) 20 mg tablet Take 1 tablet by mouth Two (2) times a day. 30 tablet 0   ??? dicyclomine (BENTYL) 10 mg capsule Take 1 capsule (10 mg total) by mouth Three (3) times a day. 270 capsule 1   ??? diphenoxylate-atropine (LOMOTIL) 2.5-0.025 mg per tablet Take 1 tablet by mouth two (2) times a day as needed for diarrhea. 30 tablet 5   ??? DULoxetine (CYMBALTA) 60 MG capsule Take 1 capsule (60 mg total) by mouth Two (2) times a day. 180 capsule 0   ??? empty container (SHARPS CONTAINER) Misc Use as directed to dispose of Humira needles 1 each 2   ??? empty container Misc Use as directed to dispose of Humira injections 1 each 2   ??? erenumab-aooe 140 mg/mL AtIn Inject 140 mg under the skin every twenty-eight (28) days. 1 mL 6   ??? ergocalciferol (DRISDOL) 1,250 mcg (50,000 unit) capsule Take 1 capsule (50,000 Units  total) by mouth once a week. 4 capsule 2   ??? famotidine (PEPCID) 20 MG tablet Take 1 tablet (20 mg total) by mouth Two (2) times a day. 180 tablet 1   ??? fluticasone propionate (FLONASE) 50 mcg/actuation nasal spray 1 spray by Each Nare route Two (2) times a day. 3 Bottle 3   ??? glipiZIDE (GLUCOTROL) 10 MG tablet Take 2 tablets (20 mg total) by mouth Two (2) times a day (30 minutes before a meal). 360 tablet 1   ??? HUMIRA PEN CITRATE FREE 40 MG/0.4 ML Inject the contents of 2 pens (80mg  total) under the skin every seven (7) days. 8 each 3   ??? hydrocortisone (PROCTOSOL HC) 2.5 % rectal cream Insert 1 application into the rectum two (2) times a day as needed. 28.35 g 2   ??? ibuprofen (ADVIL,MOTRIN) 800 MG tablet Take 1 tablet (800 mg total) by mouth every eight (8) hours as needed. 90 tablet 1   ??? insulin glargine (LANTUS U-100 INSULIN) 100 unit/mL injection Inject 0.9 mL (90 Units total) under the skin nightly. 90 mL 3   ??? insulin regular (HUMULIN R REGULAR U-100 INSULN) 100 unit/mL injection SLIDING SCALE BLOOD SUGAR 0-150=0 UNITS,150-200=3 UNITS, 200-250=6 UNITS,250-300=9 UNITS, 300-350=12 UNITS 40 mL 3   ??? insulin syringe-needle U-100 (BD INSULIN SYRINGE ULTRA-FINE) 1 mL 31 gauge x 5/16 (8 mm) Syrg Use up to 5 x daily to administer insulin. 100 each 1   ??? ketoconazole (NIZORAL) 2 % cream Apply 1 application topically daily. 15 g 11   ??? lancets Misc 1 each by Miscellaneous route Three (3) times a day before meals. 300 each 1   ??? levothyroxine (SYNTHROID) 50 MCG tablet Take 1 tablet (50 mcg total) by mouth daily. 90 tablet 1   ??? lidocaine (XYLOCAINE) 5 % ointment lidocaine 5 % topical ointment     ??? miscellaneous medical supply Misc Gauze pads 4 x 4 inches and paper tape 30 each 0   ??? miscellaneous medical supply Misc Nebulizer- Use as directed with albuterol nebs for SOB 1 each 0   ??? MITIGARE 0.6 mg cap capsule Take 1.2 mg on Day 1 of flare, followed by 0.6 mg one hour later. Day 2 take 0.6 mg BID until flare resolves. 60 capsule 3   ??? montelukast (SINGULAIR) 10 mg tablet Take 1 tablet (10 mg total) by mouth daily. 90 each 1   ??? oxybutynin (DITROPAN) 5 MG tablet Take 1 tablet (5 mg total) by mouth Two (2) times a day. 180 tablet 1   ??? polyethylene glycol (GLYCOLAX) 17 gram/dose powder take 17GM (DISSOLVED IN WATER) by mouth once daily     ??? PROAIR HFA 90 mcg/actuation inhaler Inhale 2 puffs every four (4) hours as needed for wheezing. 3 Inhaler 1   ??? propranoloL (INDERAL LA) 120 mg 24 hr capsule Take 1 capsule (120 mg total) by mouth daily. 90 capsule 1   ??? sour cherry extract (TART CHERRY EXTRACT) 1,000 mg cap      ??? topiramate (TOPAMAX) 50 MG tablet Take 1.5 tablets (75 mg total) by mouth Two (2) times a day. 270 tablet 1   ??? traZODone (DESYREL) 100 MG tablet Take 2 tablets (200 mg total) by mouth nightly. 180 tablet 0     No current facility-administered medications for this visit.        Changes to medications: Stefan reports no changes at this time.    Allergies   Allergen Reactions   ??? Erythromycin  Nausea And Vomiting     Other reaction(s): Vomiting   ??? Penicillins Rash, Swelling and Hives   ??? Sulfa (Sulfonamide Antibiotics) Nausea And Vomiting and Nausea Only   ??? Other    ??? Erythromycin Base Rash   ??? Sulfasalazine Nausea And Vomiting       Changes to allergies: No    SPECIALTY MEDICATION ADHERENCE     Humira - 2 left (1 dose)  Medication Adherence    Patient reported X missed doses in the last month: 0  Specialty Medication: Humira  Patient is on additional specialty medications: No          Specialty medication(s) dose(s) confirmed: Regimen is correct and unchanged.     Are there any concerns with adherence? No    Adherence counseling provided? Not needed    CLINICAL MANAGEMENT AND INTERVENTION      Clinical Benefit Assessment:    Do you feel the medicine is effective or helping your condition? Yes    Clinical Benefit counseling provided? Not needed    Adverse Effects Assessment:    Are you experiencing any side effects? No    Are you experiencing difficulty administering your medicine? No    Quality of Life Assessment:    How many days over the past month did your HS  keep you from your normal activities? For example, brushing your teeth or getting up in the morning. 0    Have you discussed this with your provider? Not needed    Therapy Appropriateness:    Is therapy appropriate? Yes, therapy is appropriate and should be continued    DISEASE/MEDICATION-SPECIFIC INFORMATION      For patients on injectable medications: Patient currently has 1 doses left.  Next injection is scheduled for today, 5/6.    PATIENT SPECIFIC NEEDS     - Does the patient have any physical, cognitive, or cultural barriers? No    - Is the patient high risk? No     - Does the patient require a Care Management Plan? No     - Does the patient require physician intervention or other additional services (i.e. nutrition, smoking cessation, social work)? No      SHIPPING     Specialty Medication(s) to be Shipped:   Inflammatory Disorders: Humira    Other medication(s) to be shipped: na     Changes to insurance: No    Delivery Scheduled: Yes, Expected medication delivery date: Tues, May 11.     Medication will be delivered via Same Day Courier to the confirmed prescription address in [facility].    The patient will receive a drug information handout for each medication shipped and additional FDA Medication Guides as required.  Verified that patient has previously received a Conservation officer, historic buildings.    All of the patient's questions and concerns have been addressed.    Lanney Gins   [facility] Specialty Pharmacist

## 2019-10-19 MED FILL — HUMIRA PEN CITRATE FREE 40 MG/0.4 ML: 28 days supply | Qty: 8 | Fill #3

## 2019-10-19 MED FILL — HUMIRA PEN CITRATE FREE 40 MG/0.4 ML: 28 days supply | Qty: 8 | Fill #3 | Status: AC

## 2019-10-21 MED ORDER — LEVOTHYROXINE 50 MCG TABLET
ORAL_TABLET | Freq: Every day | ORAL | 1 refills | 90.00000 days | Status: CP
Start: 2019-10-21 — End: 2020-10-20

## 2019-10-22 MED ORDER — KETOCONAZOLE 2 % TOPICAL CREAM
Freq: Every day | TOPICAL | 5 refills | 30 days | Status: CP
Start: 2019-10-22 — End: 2020-10-21

## 2019-10-26 NOTE — Unmapped (Signed)
This encounter was created in error - please disregard.

## 2019-10-28 MED ORDER — PANTOPRAZOLE 40 MG TABLET,DELAYED RELEASE
ORAL_TABLET | Freq: Two times a day (BID) | ORAL | 1 refills | 90 days | Status: CP
Start: 2019-10-28 — End: 2020-10-27

## 2019-11-05 DIAGNOSIS — L732 Hidradenitis suppurativa: Principal | ICD-10-CM

## 2019-11-05 MED ORDER — HUMIRA PEN CITRATE FREE 40 MG/0.4 ML
SUBCUTANEOUS | 3 refills | 0.00000 days | Status: CP
Start: 2019-11-05 — End: ?
  Filled 2019-11-18: qty 8, 28d supply, fill #0

## 2019-11-05 NOTE — Unmapped (Signed)
Patient last seen 06/16/19. Please advise, Thanks!

## 2019-11-09 DIAGNOSIS — L732 Hidradenitis suppurativa: Principal | ICD-10-CM

## 2019-11-12 MED ORDER — GABAPENTIN 300 MG CAPSULE
Freq: Three times a day (TID) | ORAL | 0 days
Start: 2019-11-12 — End: ?

## 2019-11-12 MED ORDER — HYDROXYZINE HCL 25 MG TABLET
0 days
Start: 2019-11-12 — End: ?

## 2019-11-12 MED ORDER — FAMOTIDINE 20 MG TABLET
Freq: Two times a day (BID) | ORAL | 0.00000 days
Start: 2019-11-12 — End: ?

## 2019-11-15 ENCOUNTER — Other Ambulatory Visit: Payer: Self-pay

## 2019-11-15 ENCOUNTER — Encounter: Payer: Self-pay | Admitting: Podiatry

## 2019-11-15 ENCOUNTER — Ambulatory Visit (INDEPENDENT_AMBULATORY_CARE_PROVIDER_SITE_OTHER): Payer: Medicare Other | Admitting: Podiatry

## 2019-11-15 VITALS — Temp 97.9°F

## 2019-11-15 DIAGNOSIS — E1142 Type 2 diabetes mellitus with diabetic polyneuropathy: Secondary | ICD-10-CM

## 2019-11-15 DIAGNOSIS — B351 Tinea unguium: Secondary | ICD-10-CM

## 2019-11-15 DIAGNOSIS — M79676 Pain in unspecified toe(s): Secondary | ICD-10-CM

## 2019-11-15 NOTE — Progress Notes (Signed)
This patient returns to my office for at risk foot care.  This patient requires this care by a professional since this patient will be at risk due to having diabetes with no complications.  This patient is unable to cut nails himself since the patient cannot reach his nails.These nails are painful walking and wearing shoes.  This patient presents for at risk foot care today.  General Appearance  Alert, conversant and in no acute stress.  Vascular  Dorsalis pedis and posterior tibial  pulses are palpable  bilaterally.  Capillary return is within normal limits  bilaterally. Temperature is within normal limits  bilaterally.  Neurologic  Senn-Weinstein monofilament wire test within normal limits  bilaterally. Muscle power within normal limits bilaterally.  Nails Thick disfigured discolored nails with subungual debris  from hallux to fifth toes bilaterally. No evidence of bacterial infection or drainage bilaterally.  Orthopedic  No limitations of motion  feet .  No crepitus or effusions noted.  No bony pathology or digital deformities noted.  Skin  normotropic skin with no porokeratosis noted bilaterally.  No signs of infections or ulcers noted.     Onychomycosis  Pain in right toes  Pain in left toes  Consent was obtained for treatment procedures.   Mechanical debridement of nails 1-5  bilaterally performed with a nail nipper.  Filed with dremel without incident.    Return office visit   4 months                   Told patient to return for periodic foot care and evaluation due to potential at risk complications.   Lando Alcalde DPM  

## 2019-11-15 NOTE — Unmapped (Signed)
[facility] Refill Coordination Note    Specialty Medication(s) to be Shipped:   Inflammatory Disorders: Humira    Other medication(s) to be shipped:      Logan Quinn, DOB: May 15, 1981  Phone: [phone] (home)       All above HIPAA information was verified with patient.     Was a Nurse, learning disability used for this call? No    Completed refill call assessment today to schedule patient's medication shipment from the [facility] [phone]).       Specialty medication(s) and dose(s) confirmed: Regimen is correct and unchanged.   Changes to medications: Farrel reports no changes at this time.  Changes to insurance: No  Questions for the pharmacist: No    Confirmed patient received Welcome Packet with first shipment. The patient will receive a drug information handout for each medication shipped and additional FDA Medication Guides as required.       DISEASE/MEDICATION-SPECIFIC INFORMATION        For patients on injectable medications: Patient currently has 1 doses left.  Next injection is scheduled for 11/18/2019.    SPECIALTY MEDICATION ADHERENCE     Medication Adherence    Patient reported X missed doses in the last month: 0  Specialty Medication: Humira CF 40 mg/0.4 ml  Patient is on additional specialty medications: No  Any gaps in refill history greater than 2 weeks in the last 3 months: no  Demonstrates understanding of importance of adherence: yes  Informant: patient  Reliability of informant: reliable  Confirmed plan for next specialty medication refill: delivery by pharmacy  Refills needed for supportive medications: not needed                humira cf 40 mg/0.4 ml . 7 days on hand      SHIPPING     Shipping address confirmed in Epic.     Delivery Scheduled: Yes, Expected medication delivery date: 11/18/2019.     Medication will be delivered via Same Day Courier to the prescription address in Epic [facility].    Logan Quinn   [facility] Specialty Technician

## 2019-11-16 DIAGNOSIS — J410 Simple chronic bronchitis: Principal | ICD-10-CM

## 2019-11-16 MED ORDER — ALBUTEROL SULFATE 2.5 MG/3 ML (0.083 %) SOLUTION FOR NEBULIZATION
RESPIRATORY_TRACT | 2 refills | 10 days | Status: CP | PRN
Start: 2019-11-16 — End: 2020-11-15

## 2019-11-16 NOTE — Unmapped (Signed)
P/C requesting refill on albuterol , last ordered on 04/21/19, last visit 09/29/19.

## 2019-11-17 NOTE — Unmapped (Signed)
Albuterol is covered by insurance not [place] required

## 2019-11-18 ENCOUNTER — Ambulatory Visit: Admit: 2019-11-18 | Discharge: 2019-11-19 | Payer: MEDICAID | Attending: Family | Primary: Family

## 2019-11-18 DIAGNOSIS — E1165 Type 2 diabetes mellitus with hyperglycemia: Principal | ICD-10-CM

## 2019-11-18 DIAGNOSIS — Z794 Long term (current) use of insulin: Principal | ICD-10-CM

## 2019-11-18 DIAGNOSIS — G4733 Obstructive sleep apnea (adult) (pediatric): Principal | ICD-10-CM

## 2019-11-18 DIAGNOSIS — K591 Functional diarrhea: Principal | ICD-10-CM

## 2019-11-18 DIAGNOSIS — J988 Other specified respiratory disorders: Principal | ICD-10-CM

## 2019-11-18 DIAGNOSIS — J42 Unspecified chronic bronchitis: Principal | ICD-10-CM

## 2019-11-18 MED ORDER — DICYCLOMINE 10 MG CAPSULE
ORAL_CAPSULE | Freq: Three times a day (TID) | ORAL | 1 refills | 90 days | Status: CP
Start: 2019-11-18 — End: 2020-11-17

## 2019-11-18 MED ORDER — ADVAIR DISKUS 500 MCG-50 MCG/DOSE POWDER FOR INHALATION
Freq: Two times a day (BID) | RESPIRATORY_TRACT | 11 refills | 30.00000 days
Start: 2019-11-18 — End: 2020-11-18

## 2019-11-18 MED ORDER — FLUTICASONE PROPIONATE 50 MCG/ACTUATION NASAL SPRAY,SUSPENSION
Freq: Two times a day (BID) | NASAL | 5 refills | 0.00000 days | Status: CP
Start: 2019-11-18 — End: 2020-11-17

## 2019-11-18 MED FILL — HUMIRA PEN CITRATE FREE 40 MG/0.4 ML: 28 days supply | Qty: 8 | Fill #0 | Status: AC

## 2019-11-18 NOTE — Unmapped (Signed)
Assessment and Plan:     Logan Quinn was seen today for diabetes.    Diagnoses and all orders for this visit:    Type 2 diabetes mellitus with hyperglycemia, with long-term current use of insulin (CMS-HCC)  HGB A1c 8.7 (up from 6.5 three months ago).   DM not well controlled. Continue glipizide 20 mg BID, Lantus 90 units HS and Humulin at every meal as with sliding scale.  Reinforced carb-controlled diet. Encouraged regular exercise.   Advised to closely monitor home BGs and to contact clinic if readings worsen or remain consistently elevated.  -     POCT glycosylated hemoglobin (Hb A1C)  -     HM DIABETES FOOT EXAM    Diarrhea, functional  Refilled dicyclomine, continue as ordered.  -     dicyclomine (BENTYL) 10 mg capsule; Take 1 capsule (10 mg total) by mouth Three (3) times a day.    Severe obstructive sleep apnea  Refilled Flonase, continue as ordered.  -     fluticasone propionate (FLONASE) 50 mcg/actuation nasal spray; 1 spray into each nostril Two (2) times a day.    Chronic bronchitis, unspecified chronic bronchitis type (CMS-HCC)  Resume Advair BID for daily maintenance. Continue albuterol prn.  -     Spirometry  -     ADVAIR DISKUS 500-50 mcg/dose diskus; Inhale 1 puff 2 (two) times a day.    Other specified respiratory disorders   -     Spirometry  -     ADVAIR DISKUS 500-50 mcg/dose diskus; Inhale 1 puff 2 (two) times a day.    Counseled on HM recommendations including COPD spirometry, COVID-19 vaccine.     HPI:      Logan Quinn  is here for   Chief Complaint   Patient presents with   ??? Diabetes     f/u     Diabetes: Patient presents for follow up of diabetes.  A1C goal is <8.  Diabetes has customarily been at goal (complicated by: obesity).  Current symptoms include: polyuria. Symptoms have gradually worsened. Patient denies foot ulcerations, hyperglycemia, hypoglycemia , increased appetite, nausea, paresthesia of the feet, polydipsia, polyuria, visual disturbances, vomiting and weight loss. Evaluation to date has included: fasting blood sugar and hemoglobin A1C.  Home sugars: BGs range between 180 and 200, Current BG 176 (by Dexacom). He reports BGs have remained elevated since ED visit 09/17/19, when he ran out of Lantus. Current treatment: Continued glipizide and insulin  which has been effective.  Doing regular exercise: no. He is trying to limit sodas, reports he primarily drinks water or water with zero sugar juice/lemonade packet.    Answers for HPI/ROS submitted by the patient on 11/15/2019  Diabetes type: type 2  MedicAlert ID: No  Disease duration: 7 years  fatigue: No  foot paresthesias: No  foot ulcerations: No  polyphagia: No  polyuria: Yes  visual change: No  Symptom course: worsening  confusion: No  hunger: No  mood changes: No  pallor: No  sleepiness: No  speech difficulty: No  sweats: No  blackouts: No  hospitalization: No  nocturnal hypoglycemia: No  required assistance: No  required glucagon: No  CVA: No  heart disease: No  impotence: Yes  nephropathy: No  peripheral neuropathy: No  PVD: No  retinopathy: No  CAD risks: family history, obesity, sedentary lifestyle  Current treatments: insulin injections, oral agent (monotherapy)  Treatment compliance: most of the time  Dose schedule: pre-breakfast  Given by: patient  Injection  sites: abdominal wall, arms, thighs  Monitoring compliance: excellent  Blood glucose trend: increasing steadily  breakfast time: 6-7 am  breakfast glucose level: 90-110  lunch time: 12-1 pm  lunch glucose level: 140-180  dinner time: 7-8 pm  dinner glucose level: >200  High score: >200  Overall: 180-200  Weight trend: stable  Current diet: diabetic  Meal planning: carbohydrate counting  Exercise: three times a week  Dietitian visit: No  Eye exam current: Yes  Sees podiatrist: Yes    Patient reports dyspnea with light exertion. He will become SOB and need to rest after walking from his bedroom to the kitchen. Symptoms have significantly worsened since having COVID-19 in 04/2019. He has been using albuterol inhaler prn. He has not been using Advair for daily maintenance due to being too expensive when he was on Medicaid. He now has Medicare and requests refill.    He requests refill of dicyclomine.     HM:  COVID-19 vaccine: not yet completed       [facility] Components:     Goals     ??? Self- Management Goal        Things to think about to help me reach my goal:     What are you going to do? Increase activity/motivation   How and how much? Clean up and do something constructive   How frequent? 2 x week    Barriers to success? fatigue   Solutions to barriers? Going to sleep earlier          ??? Self- Management Goal        Things to think about to help me reach my goal:     What are you going to do? Continue physical activity    How and how much? Fixing house    How frequent? daily   Barriers to success?    Solutions to barriers?                Medication adherence and barriers to the treatment plan have been addressed. Opportunities to optimize healthy behaviors have been discussed. Patient / caregiver voiced understanding.      Past Medical/Surgical History:     Past Medical History:   Diagnosis Date   ??? Acne    ??? Allergic    ??? Anxiety    ??? Depression    ??? Diabetes mellitus (CMS-HCC)    ??? GERD (gastroesophageal reflux disease)    ??? Gout    ??? Hypertension    ??? IBS (irritable bowel syndrome)    ??? 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 Liver disease    ??? Migraines    ??? Morbid obesity with BMI of 60.0-69.9, adult (CMS-HCC)    ??? 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 Obstructive sleep apnea    ??? OSA on CPAP    ??? Severe obstructive sleep apnea    ??? Trapezius muscle strain 12/07/2013   ??? Urinary incontinence, nocturnal enuresis    ??? Venous insufficiency      Past Surgical History:   Procedure Laterality Date   ??? EYE SURGERY  11/15   ??? PR COLONOSCOPY FLX DX W/COLLJ SPEC WHEN PFRMD N/A 03/24/2013    Procedure: COLONOSCOPY, FLEXIBLE, PROXIMAL TO SPLENIC FLEXURE; DIAGNOSTIC, W/WO COLLECTION SPECIMEN BY BRUSH OR WASH;  Surgeon: Clint Bolder, MD;  [facility];  Service: Gastroenterology   ??? PR EYE SURG POST SGMT PROC UNLISTED Left     pneumatic retinopexy OS   ???  PR UPPER GI ENDOSCOPY,DIAGNOSIS N/A 02/02/2013    Procedure: UGI ENDO, INCLUDE ESOPHAGUS, STOMACH, & DUODENUM &/OR JEJUNUM; DX W/WO COLLECTION SPECIMN, BY BRUSH OR WASH;  Surgeon: Malcolm Metro, MD;  Location: [facility];  Service: Gastroenterology   ??? [place] PYLORIC STENOSIS ([place] HISTORICAL RESULT)         Family History:     Family History   Problem Relation Age of Onset   ??? Cancer Maternal Grandfather    ??? Hearing loss Maternal Grandfather    ??? Cancer Paternal Grandfather    ??? COPD Paternal Grandmother    ??? Arthritis Paternal Grandmother    ??? Depression Paternal Grandmother    ??? Diabetes Mother    ??? Heart disease Mother    ??? Migraines Mother    ??? Arthritis Mother    ??? Depression Mother    ??? GER disease Mother    ??? Hypertension Mother    ??? Angina Mother    ??? COPD Mother    ??? Glaucoma Mother    ??? Hearing loss Mother    ??? Diabetes Sister    ??? Migraines Sister    ??? Asthma Sister    ??? Depression Sister    ??? Hearing loss Sister    ??? Diabetes Brother    ??? Asthma Brother    ??? Diabetes Maternal Grandmother    ??? Heart disease Maternal Grandmother    ??? Migraines Maternal Grandmother    ??? Depression Maternal Grandmother    ??? Angina Maternal Grandmother    ??? Hypertension Maternal Grandmother    ??? Stroke Maternal Grandmother    ??? Diabetes Maternal Uncle    ??? Hearing loss Maternal Uncle    ??? Diabetes Maternal Uncle    ??? Liver disease Maternal Uncle    ??? Kidney disease Maternal Uncle    ??? Asthma Brother    ??? Colorectal Cancer Neg Hx    ??? Esophageal cancer Neg Hx    ??? Liver cancer Neg Hx    ??? Pancreatic cancer Neg Hx    ??? Stomach cancer Neg Hx    ??? Amblyopia Neg Hx    ??? Blindness Neg Hx    ??? Retinal detachment Neg Hx    ??? Strabismus Neg Hx    ??? Macular degeneration Neg Hx    ??? Anesthesia problems Neg Hx    ??? Broken bones Neg Hx ??? Clotting disorder Neg Hx    ??? Collagen disease Neg Hx    ??? Dislocations Neg Hx    ??? Fibromyalgia Neg Hx    ??? Gout Neg Hx    ??? Hemophilia Neg Hx    ??? Osteoporosis Neg Hx    ??? Rheumatologic disease Neg Hx    ??? Scoliosis Neg Hx    ??? Severe sprains Neg Hx    ??? Sickle cell anemia Neg Hx    ??? Spinal Compression Fracture Neg Hx    ??? Melanoma Neg Hx    ??? Basal cell carcinoma Neg Hx    ??? Squamous cell carcinoma Neg Hx    ??? Deep vein thrombosis Neg Hx        Social History:     Social History     Socioeconomic History   ??? Marital status: Married     Spouse name: None   ??? Number of children: None   ??? Years of education: None   ??? Highest education level: None   Occupational History   ???  None   Tobacco Use   ??? Smoking status: Never Smoker   ??? Smokeless tobacco: Never Used   Vaping Use   ??? Vaping Use: Never used   Substance and Sexual Activity   ??? Alcohol use: Never     Alcohol/week: 0.0 standard drinks     Comment: rare social   ??? Drug use: Never   ??? Sexual activity: Yes     Partners: Female     Birth control/protection: None   Other Topics Concern   ??? Do you use sunscreen? Yes   ??? Tanning bed use? No   ??? Are you easily burned? No   ??? Excessive sun exposure? No   ??? Blistering sunburns? No   Social History Narrative   ??? None     Social Determinants of Health     Financial Resource Strain:    ??? Difficulty of Paying Living Expenses:    Food Insecurity:    ??? Worried About Programme researcher, broadcasting/film/video in the Last Year:    ??? Barista in the Last Year:    Transportation Needs:    ??? Freight forwarder (Medical):    ??? Lack of Transportation (Non-Medical):    Physical Activity:    ??? Days of Exercise per Week:    ??? Minutes of Exercise per Session:    Stress:    ??? Feeling of Stress :    Social Connections:    ??? Frequency of Communication with Friends and Family:    ??? Frequency of Social Gatherings with Friends and Family:    ??? Attends Religious Services:    ??? Database administrator or Organizations:    ??? Attends Engineer, structural:    ??? Marital Status:        Allergies:     Erythromycin, Penicillins, Sulfa (sulfonamide antibiotics), Other, Erythromycin base, and Sulfasalazine    Current Medications:     Current Outpatient Medications   Medication Sig Dispense Refill   ??? acetaminophen (TYLENOL) 500 MG tablet Take 2 tablets (1,000 mg total) by mouth Three (3) times a day. 30 tablet 0   ??? ADVAIR DISKUS 500-50 mcg/dose diskus Inhale 1 puff 2 (two) times a day. 60 each 11   ??? albuterol 2.5 mg /3 mL (0.083 %) nebulizer solution Inhale 3 mL (2.5 mg total) by nebulization every four (4) hours as needed for wheezing. 180 mL 2   ??? albuterol HFA 90 mcg/actuation inhaler Inhale 2 puffs every six (6) hours as needed for wheezing. 3 Inhaler 1   ??? ALPRAZolam (XANAX) 0.5 MG tablet Take 1 tablet (0.5 mg total) by mouth Three (3) times a day. 90 tablet 1   ??? blood sugar diagnostic Strp by Other route Three (3) times a day before meals. 300 each 1   ??? blood-glucose meter kit Use as directed 1 each 0   ??? cephalexin (KEFLEX) 500 MG capsule Take 1 capsule (500 mg total) by mouth Four (4) times a day. 40 capsule 0   ??? cetirizine (ZYRTEC) 10 MG tablet Take 1 tablet (10 mg total) by mouth nightly as needed for allergies. 90 tablet 1   ??? chlorhexidine (PERIDEX) 0.12 % solution 15 mL by Mouth route Two (2) times a day. 437 mL 0   ??? clindamycin (CLEOCIN T) 1 % external solution Apply topically Two (2) times a day. 60 mL 4   ??? clobetasoL (TEMOVATE) 0.05 % ointment Apply daily to painful affected areas if needed  for flares, then stop 15 g 1   ??? cyclobenzaprine (FLEXERIL) 10 MG tablet Take 1 tablet (10 mg total) by mouth Three (3) times a day as needed for muscle spasms. 60 tablet 0   ??? dextroamphetamine-amphetamine (ADDERALL) 20 mg tablet Take 1 tablet by mouth Two (2) times a day. 30 tablet 0   ??? dicyclomine (BENTYL) 10 mg capsule Take 1 capsule (10 mg total) by mouth Three (3) times a day. 270 capsule 1   ??? diphenoxylate-atropine (LOMOTIL) 2.5-0.025 mg per tablet Take 1 tablet by mouth two (2) times a day as needed for diarrhea. 30 tablet 5   ??? DULoxetine (CYMBALTA) 60 MG capsule Take 1 capsule (60 mg total) by mouth Two (2) times a day. 180 capsule 0   ??? empty container (SHARPS CONTAINER) Misc Use as directed to dispose of Humira needles 1 each 2   ??? empty container Misc Use as directed to dispose of Humira injections 1 each 2   ??? erenumab-aooe 140 mg/mL AtIn Inject 140 mg under the skin every twenty-eight (28) days. 1 mL 6   ??? ergocalciferol (DRISDOL) 1,250 mcg (50,000 unit) capsule Take 1 capsule (50,000 Units total) by mouth once a week. 4 capsule 2   ??? famotidine (PEPCID) 20 MG tablet Take 20 mg by mouth Two (2) times a day.     ??? fluticasone propionate (FLONASE) 50 mcg/actuation nasal spray 1 spray into each nostril Two (2) times a day. 16 g 5   ??? gabapentin (NEURONTIN) 300 MG capsule Take 300 mg by mouth Three (3) times a day.     ??? glipiZIDE (GLUCOTROL) 10 MG tablet Take 2 tablets (20 mg total) by mouth Two (2) times a day (30 minutes before a meal). 360 tablet 1   ??? HUMIRA PEN CITRATE FREE 40 MG/0.4 ML Inject the contents of 2 pens (80mg  total) under the skin every seven (7) days. 8 each 3   ??? hydrocortisone (PROCTOSOL HC) 2.5 % rectal cream Insert 1 application into the rectum two (2) times a day as needed. 28.35 g 2   ??? hydrOXYzine (ATARAX) 25 MG tablet TAKE 1/2 TO 4 TABLETS BY MOUTH UP TO THREE TIMES DAILY AS NEEDED FOR ANXIETY     ??? ibuprofen (ADVIL,MOTRIN) 800 MG tablet Take 1 tablet (800 mg total) by mouth every eight (8) hours as needed. 90 tablet 1   ??? insulin glargine (LANTUS U-100 INSULIN) 100 unit/mL injection Inject 0.9 mL (90 Units total) under the skin nightly. 90 mL 3   ??? insulin regular (HUMULIN R REGULAR U-100 INSULN) 100 unit/mL injection SLIDING SCALE BLOOD SUGAR 0-150=0 UNITS,150-200=3 UNITS, 200-250=6 UNITS,250-300=9 UNITS, 300-350=12 UNITS 40 mL 3   ??? insulin syringe-needle U-100 (BD INSULIN SYRINGE ULTRA-FINE) 1 mL 31 gauge x 5/16 (8 mm) Syrg Use up to 5 x daily to administer insulin. 100 each 1   ??? ketoconazole (NIZORAL) 2 % cream Apply 1 application topically daily. 15 g 5   ??? lancets Misc 1 each by Miscellaneous route Three (3) times a day before meals. 300 each 1   ??? levothyroxine (SYNTHROID) 50 MCG tablet Take 1 tablet (50 mcg total) by mouth daily. 90 tablet 1   ??? lidocaine (XYLOCAINE) 5 % ointment lidocaine 5 % topical ointment     ??? miscellaneous medical supply Misc Gauze pads 4 x 4 inches and paper tape 30 each 0   ??? miscellaneous medical supply Misc Nebulizer- Use as directed with albuterol nebs for SOB 1 each 0   ???  MITIGARE 0.6 mg cap capsule Take 1.2 mg on Day 1 of flare, followed by 0.6 mg one hour later. Day 2 take 0.6 mg BID until flare resolves. 60 capsule 3   ??? montelukast (SINGULAIR) 10 mg tablet Take 1 tablet (10 mg total) by mouth daily. 90 each 1   ??? oxybutynin (DITROPAN) 5 MG tablet Take 1 tablet (5 mg total) by mouth Two (2) times a day. 180 tablet 1   ??? pantoprazole (PROTONIX) 40 MG tablet Take 1 tablet (40 mg total) by mouth Two (2) times a day. 180 tablet 1   ??? polyethylene glycol (GLYCOLAX) 17 gram/dose powder take 17GM (DISSOLVED IN WATER) by mouth once daily     ??? PROAIR HFA 90 mcg/actuation inhaler Inhale 2 puffs every four (4) hours as needed for wheezing. 3 Inhaler 1   ??? propranoloL (INDERAL LA) 120 mg 24 hr capsule Take 1 capsule (120 mg total) by mouth daily. 90 capsule 1   ??? sour cherry extract (TART CHERRY EXTRACT) 1,000 mg cap      ??? topiramate (TOPAMAX) 50 MG tablet Take 1.5 tablets (75 mg total) by mouth Two (2) times a day. 270 tablet 1   ??? traZODone (DESYREL) 100 MG tablet Take 2 tablets (200 mg total) by mouth nightly. 180 tablet 0     No current facility-administered medications for this visit.       Health Maintenance:     Health Maintenance Summary w/Most Recent Date       Status Date      COPD Spirometry Overdue 1982/06/05     COVID-19 Vaccine Overdue 11/21/1993     Hemoglobin A1c Next Due 02/18/2020 Done 11/18/2019 Registry Metric: Last Hemoglobin A1c Date     Done 11/18/2019 POCT GLYCOSYLATED HEMOGLOBIN (HGB A1C) HGB A1C, RAP/PDS           Done 08/18/2019 POCT GLYCOSYLATED HEMOGLOBIN (HGB A1C) HGB A1C, RAP/PDS           Done 05/20/2019 POCT GLYCOSYLATED HEMOGLOBIN (HGB A1C) HGB A1C, RAP/PDS           Done 02/08/2019 POCT GLYCOSYLATED HEMOGLOBIN (HGB A1C) HGB A1C, RAP/PDS           Patient has more history with this topic...    Potassium Monitoring Next Due 04/24/2020      Done 04/25/2019 Registry Metric: Potassium     Done 04/25/2019 BASIC METABOLIC PANEL Potassium           Done 04/21/2019 BASIC METABOLIC PANEL Potassium           Done 04/07/2018 COMPREHENSIVE METABOLIC PANEL Potassium           Done 04/20/2017 BASIC METABOLIC PANEL Potassium           Patient has more history with this topic...    Retinal Eye Exam Next Due 06/09/2020      Done 06/10/2019 SmartData: [facility] FUNDUS OD PERIPHERY     Done 06/10/2019 SmartData: FINDINGS - PE - EYES - FUNDUSCOPIC - PERIPHERY - RIGHT PERIPHERY NORMAL     Done 06/10/2019 SmartData: OPHTH FUNDUS OS PERIPHERY     Done 06/10/2019 OCT, RETINA - OU - BOTH EYES     Done 09/17/2017 SmartData: OPHTH FUNDUS OD PERIPHERY     Patient has more history with this topic...    Urine Albumin/Creatinine Ratio Next Due 09/16/2020      Done 09/17/2019 ALBUMIN, URINE RANDOM Albumin/Creatinine Ratio           Done 07/08/2018 ALBUMIN /  CREATININE URINE RATIO Albumin/Creatinine Ratio           Done 06/30/2017 ALBUMIN / CREATININE URINE RATIO Albumin/Creatinine Ratio           Done 05/24/2016 ALBUMIN / CREATININE URINE RATIO Albumin/Creatinine Ratio           Done 03/24/2015 ALBUMIN / CREATININE URINE RATIO Albumin/Creatinine Ratio           Patient has more history with this topic...    Serum Creatinine Monitoring Next Due 09/16/2020      Done 09/17/2019 COMPREHENSIVE METABOLIC PANEL Creatinine           Done 04/25/2019 BASIC METABOLIC PANEL Creatinine           Done 04/21/2019 BASIC METABOLIC PANEL Creatinine           Done 04/07/2018 COMPREHENSIVE METABOLIC PANEL Creatinine           Done 04/20/2017 BASIC METABOLIC PANEL Creatinine           Patient has more history with this topic...    Foot Exam Next Due 11/17/2020      Done 11/18/2019 HM DIABETES FOOT EXAM     Done 10/21/2018 HM DIABETES FOOT EXAM     Done 10/06/2017 HM DIABETES FOOT EXAM     Done 09/13/2016 HM DIABETES FOOT EXAM     Done 08/07/2015 HM DIABETES FOOT EXAM    DTaP/Tdap/Td Vaccines Next Due 07/17/2023      Done 07/16/2013 Imm Admin: TdaP     Done 07/18/2008 Imm Admin: TdaP     Done 04/29/2000 Imm Admin: Tetanus and diptheria,(adult), adsorbed, 2Lf tetanus toxoid, PF    Pneumococcal Vaccine This plan is no longer active.      Done 12/30/2012 Imm Admin: PNEUMOCOCCAL POLYSACCHARIDE 23    Hepatitis C Screen This plan is no longer active.      Done 04/29/2018 HEPATITIS C ANTIBODY Hepatitis C Ab           Done 01/01/2013 HEPATITIS C ANTIBODY    Influenza Vaccine This plan is no longer active.      Done 03/31/2019 Imm Admin: INFLUENZA INJ MDCK PF, Quad (Flucelvax)(4y and up Egg Free)     Done 03/25/2019 Imm Admin: Influenza Virus Vaccine, unspecified formulation     Done 02/23/2018 Imm Admin: Influenza Vaccine Quad (IIV4 PF) 30mo+ injectable     Done 02/23/2018 Imm Admin: Influenza Virus Vaccine, unspecified formulation     Done 03/19/2017 Imm Admin: Influenza Vaccine Quad (IIV4 PF) 52mo+ injectable     Patient has more history with this topic...          Immunizations:     Immunization History   Administered Date(s) Administered   ??? Hepatitis B Vaccine, Unspecified Formulation 12/27/1999, 04/29/2000   ??? Hepatitis B, Adult 03/05/2013, 04/05/2013, 09/03/2013   ??? INFLUENZA INJ MDCK PF, Quad (Flucelvax)(4y and up Egg Free) 03/31/2019   ??? INFLUENZA TIV (TRI) PF (IM) 10/08/2011   ??? Influenza Vaccine Quad (IIV4 PF) 62mo+ injectable 03/05/2013, 03/18/2014, 03/22/2015, 03/05/2016, 03/19/2017, 02/23/2018   ??? Influenza Virus Vaccine, unspecified formulation 03/19/2017, 02/23/2018, 03/25/2019   ??? PNEUMOCOCCAL POLYSACCHARIDE 23 12/30/2012   ??? PPD Test 10/28/2016   ??? TdaP 07/18/2008, 07/16/2013   ??? Tetanus and diptheria,(adult), adsorbed, 2Lf tetanus toxoid, PF 04/29/2000       I have reviewed and (if needed) updated the patient's problem list, medications, allergies, past medical and surgical history, social and family history.    ROS:  ROS  Comprehensive 10 point ROS negative unless otherwise stated in the HPI.       Vital Signs:     Wt Readings from Last 3 Encounters:   11/18/19 (!) 233.1 kg (514 lb)   09/29/19 (!) 228.2 kg (503 lb)   09/17/19 (!) 230.4 kg (508 lb)     Temp Readings from Last 3 Encounters:   11/18/19 36.6 ??C (97.9 ??F) (Oral)   09/29/19 36.6 ??C (97.8 ??F) (Oral)   09/17/19 36.7 ??C (98 ??F) (Oral)     BP Readings from Last 3 Encounters:   11/18/19 128/68   09/29/19 124/66   09/17/19 128/61     Pulse Readings from Last 3 Encounters:   11/18/19 55   09/29/19 61   09/17/19 80     Estimated body mass index is 71.72 kg/m?? as calculated from the following:    Height as of this encounter: 180.3 cm (5' 10.98).    Weight as of this encounter: 233.1 kg (514 lb).  Facility age limit for growth percentiles is 20 years.    Objective:      General: Alert and oriented x3. Well-appearing. No acute distress.   HEENT:  Normocephalic.  Atraumatic. Conjunctiva and sclera normal. OP MMM without lesions.   Neck:  Supple. No thyroid enlargement. No adenopathy.   Heart:  Regular rate and rhythm . Normal S1, S2.  No murmurs, rubs or gallops.   Lungs:  No respiratory distress.  Lungs clear to auscultation. No wheezes, rhonchi, or rales.   GI/GU:  Soft, obese, +BS, nondistended, non-TTP.   Extremities:  Lymphedema present. Peripheral pulses normal.   Skin:  Warm, dry. No rash or lesions present.   Neuro:  Non-focal. No obvious weakness.   Psych:  Affect normal, eye contact good, speech clear and coherent.     Foot Exam:  No pedal edema. Callused skin to plantar surface of right foot. Monofilament testing with 10/10 sensation intact bilaterally.      I attest that I, Bayard Hugger, personally documented this note while acting as scribe for [name], FNP.      Bayard Hugger, Scribe.  11/18/2019     The documentation recorded by the scribe accurately reflects the service I personally performed and the decisions made by me.    [name], FNP

## 2019-11-19 ENCOUNTER — Encounter: Admit: 2019-11-19 | Discharge: 2019-11-20 | Payer: MEDICAID

## 2019-11-19 DIAGNOSIS — M25561 Pain in right knee: Principal | ICD-10-CM

## 2019-11-19 MED ORDER — CYCLOBENZAPRINE 10 MG TABLET
ORAL_TABLET | Freq: Three times a day (TID) | ORAL | 0 refills | 20 days | PRN
Start: 2019-11-19 — End: 2020-11-18

## 2019-11-22 MED ORDER — CYCLOBENZAPRINE 10 MG TABLET
ORAL_TABLET | Freq: Three times a day (TID) | ORAL | 0 refills | 20.00000 days | Status: CP | PRN
Start: 2019-11-22 — End: 2020-11-21

## 2019-11-22 NOTE — Unmapped (Signed)
P/C requesting refill on flexeril , last ordered on 06/30/19, last visit 11/18/19

## 2019-11-29 NOTE — Unmapped (Signed)
Data reviewed. Will continue to work to lower BS for better DM control.

## 2019-12-02 MED ORDER — PROPRANOLOL ER 120 MG CAPSULE,24 HR,EXTENDED RELEASE
ORAL_CAPSULE | Freq: Every day | ORAL | 1 refills | 90.00000 days | Status: CP
Start: 2019-12-02 — End: 2020-12-01

## 2019-12-02 NOTE — Unmapped (Signed)
P/C requesting refill on propranolol last ordered on 01/06/19, last visit 11/18/19

## 2019-12-08 NOTE — Unmapped (Signed)
PA Aimovig initiated by phone with [facility] as pts insurance has changed. Approved through 06/09/2020

## 2019-12-08 NOTE — Unmapped (Signed)
PA Aimovig completed and faxed to [facility]

## 2019-12-15 MED ORDER — NYSTATIN 100,000 UNIT/GRAM TOPICAL POWDER
1 refills | 0 days | Status: CP
Start: 2019-12-15 — End: 2020-12-14

## 2019-12-20 NOTE — Unmapped (Signed)
PA Aimovig approved through 06/09/20

## 2019-12-20 NOTE — Unmapped (Signed)
[facility] Refill Coordination Note    Specialty Medication(s) to be Shipped:   Inflammatory Disorders: Humira    Other medication(s) to be shipped: none     Logan Quinn, DOB: December 21, 1980  Phone: [phone] (home)       All above HIPAA information was verified with patient.     Was a Nurse, learning disability used for this call? No    Completed refill call assessment today to schedule patient's medication shipment from the [facility] [phone]).       Specialty medication(s) and dose(s) confirmed: Regimen is correct and unchanged.   Changes to medications: Declin reports no changes at this time.  Changes to insurance: No  Questions for the pharmacist: No    Confirmed patient received Welcome Packet with first shipment. The patient will receive a drug information handout for each medication shipped and additional FDA Medication Guides as required.       DISEASE/MEDICATION-SPECIFIC INFORMATION        For patients on injectable medications: Patient currently has 0 doses left.  Next injection is scheduled for 12/23/19.    SPECIALTY MEDICATION ADHERENCE     Medication Adherence    Patient reported X missed doses in the last month: 0  Specialty Medication: Humira CF 40 mg/0.4 ml  Patient is on additional specialty medications: No            HUMIRA: 0 days worth of medication on hand.        SHIPPING     Shipping address confirmed in Epic.     Delivery Scheduled: Yes, Expected medication delivery date: 12/23/19.     Medication will be delivered via Same Day Courier to the prescription address in Epic WAM.    [place] A Lamarcus Spira   [facility] Specialty Technician

## 2019-12-20 NOTE — Unmapped (Signed)
The [facility] has made a third and final attempt to reach this patient to refill the following medication:Humira.      We have left voicemails on the following phone numbers: [phone] and have been unable to leave messages on the following phone numbers: [phone].    Dates contacted: 7/2 7/6 7/12  Last scheduled delivery: 6/10    The patient may be at risk of non-compliance with this medication. The patient should call the [facility] at [phone] (option 4) to refill medication.    Aadith Raudenbush D Administrator Shared [facility] Specialty Technician

## 2019-12-21 MED FILL — HUMIRA PEN CITRATE FREE 40 MG/0.4 ML: 28 days supply | Qty: 8 | Fill #1 | Status: AC

## 2019-12-21 MED FILL — HUMIRA PEN CITRATE FREE 40 MG/0.4 ML: 28 days supply | Qty: 8 | Fill #1

## 2020-01-11 NOTE — Unmapped (Signed)
[name] at [facility] called and left me a message requesting a new order for either Humalog or Novolog for [name] new omnipod pump.  He is using 120 units a day.  Since this is a new order I'm not sure how to proceed.

## 2020-01-12 NOTE — Unmapped (Signed)
[place] Assessment of Medications Program (CAMP)                        RECRUITMENT SUMMARY NOTE       Patient was identified for CAMP Services.   A letter has been sent explaining program services.        Tenna Delaine, CPhT  Clinical Operations Specialist  [place] Assessment of Medications Program (CAMP)  p [phone]  -  f [phone]

## 2020-01-16 DIAGNOSIS — L732 Hidradenitis suppurativa: Principal | ICD-10-CM

## 2020-01-18 NOTE — Unmapped (Signed)
[place] Assessment of Medications Program (CAMP)                        RECRUITMENT SUMMARY NOTE       Patient was outreached for CAMP Services. Patient scheduled.     Video Visit Scheduling Checklist:   COS has verified the following with the patient/patient representative:   [x]  Verify access to device with camera (smartphone/tablet or computer with webcam)   []  MyChart > Verify patient/patient representative is able access MyChart  (add MyChart in appt note)   []  Instructions for MyChart video visits sent  [x]  No MyChart access ???  patient will receive link from PharmD at time of appointment   [x]  Link > Verify e-mail address AND/OR cell phone number with patient    [x]  Patient wants link sent to cell phone (add text link in appt note)   []  Patient wants link sent to e-mail  (add e-mail link in appt note)  Troubleshooting (MyChart access):  []  Provide patient with [facility] HealthLink contact information for additional support- [phone]    Camp Scheduled      Med Pre Visit Patient Level Data    PCP confirmed on care team?: Yes  How many pharmacies do you obtain medications from?: 1  What type of pharmacies do you use?: Retail Pharmacy  Who helps you with your medications?: Manage Myself  Do you have issues affording the cost of any of your medications?: No  DO you have fills for 90 day supplies?: No  Are there any other things you hope to discuss with your pharmacist during your visit?: No  Additional comments for Pre Visit Planning: Inhalers: Advair, albuterol         Med Baseline Patient Level Data    Do you have any allergies to medications?: Yes  What tools do you use to help you manage your medications?: Pill box, Alarm  How often do you forget to take your medications?: Sometimes         Logan Quinn, CPhT  Clinical Operations Specialist  [place] Assessment of Medications Program (CAMP)  p [phone]  -  f [phone]

## 2020-01-21 DIAGNOSIS — L732 Hidradenitis suppurativa: Principal | ICD-10-CM

## 2020-01-21 DIAGNOSIS — N3281 Overactive bladder: Principal | ICD-10-CM

## 2020-01-21 DIAGNOSIS — N3944 Nocturnal enuresis: Principal | ICD-10-CM

## 2020-01-21 MED ORDER — OXYBUTYNIN CHLORIDE 5 MG TABLET
ORAL_TABLET | Freq: Two times a day (BID) | ORAL | 1 refills | 90 days | Status: CP
Start: 2020-01-21 — End: 2021-01-20

## 2020-01-21 MED ORDER — CETIRIZINE 10 MG TABLET
ORAL_TABLET | Freq: Every evening | ORAL | 1 refills | 90 days | Status: CP | PRN
Start: 2020-01-21 — End: 2021-01-20

## 2020-01-21 NOTE — Unmapped (Signed)
[place] Assessment of Medications Program (CAMP)                    RECRUITMENT SUMMARY NOTE       Patient was outreached to remind about upcoming CAMP appointment. Patient confirmed appointment.      Video Visit Reminder Checklist:   COS has verified the following with the patient/patient representative:   [x]  Verify access to device with camera (smartphone/tablet or computer with webcam)   [x]  Verify patient/patient representative is able access MyChart (documented in appt notes)  []  Verify patient/patient representative is able access link by text/email (documented appt notes)  [x]  Verify e-mail address AND/OR cell phone number with patient  [x]  Verify name of each inhaler with patient (Advair, albuterol)  [x]  Remind patient to have inhalers on hand at time of appointment  Select the following:   [] MyChart    []   Computer: Confirm Chrome is Therapist, art (send patient visit instructions for PC, if needed)   []   Phone/tablet: confirm MyChart app is downloaded on mobile device (send patient visit instructions for mobile again, if needed)  [x]  Link  [x]  Phone/tablet: Confirm Chrome is Therapist, art and patient is able to receive texts and has camera on phone  []  Computer: Confirm Chrome is Therapist, art and Ensure patient is able to access e-mail and has a Sport and exercise psychologist (MyChart access):  []  Provide patient with [facility] HealthLink contact information for additional support- [phone]         Tenna Delaine, CPhT  Clinical Operations Specialist  [place] Assessment of Medications Program (CAMP)  p [phone]  -  f [phone]

## 2020-01-21 NOTE — Unmapped (Signed)
Refilled oxybutynin and syrtec

## 2020-01-21 NOTE — Unmapped (Signed)
[facility] Refill Coordination Note    Specialty Medication(s) to be Shipped:   Inflammatory Disorders: Humira    Other medication(s) to be shipped: No additional medications requested for fill at this time     Logan Quinn, DOB: [birth year]  Phone: [phone] (home)       All above HIPAA information was verified with patient.     Was a Nurse, learning disability used for this call? No    Completed refill call assessment today to schedule patient's medication shipment from the [facility] [phone]).       Specialty medication(s) and dose(s) confirmed: Regimen is correct and unchanged.   Changes to medications: Caliber reports no changes at this time.  Changes to insurance: No  Questions for the pharmacist: No    Confirmed patient received Welcome Packet with first shipment. The patient will receive a drug information handout for each medication shipped and additional FDA Medication Guides as required.       DISEASE/MEDICATION-SPECIFIC INFORMATION        For patients on injectable medications: Patient currently has 0 doses left.  Next injection is scheduled for 01/27/20.    SPECIALTY MEDICATION ADHERENCE     Medication Adherence    Patient reported X missed doses in the last month: 0  Specialty Medication: Humira CF 40 mg/0.4 ml   Patient is on additional specialty medications: No  Informant: patient                    SHIPPING     Shipping address confirmed in Epic.     Delivery Scheduled: Yes, Expected medication delivery date: 01/25/20.     Medication will be delivered via Same Day Courier to the prescription address in Epic WAM.    Logan Quinn   [facility] Specialty Technician

## 2020-01-24 DIAGNOSIS — L732 Hidradenitis suppurativa: Principal | ICD-10-CM

## 2020-01-24 NOTE — Unmapped (Signed)
[place] Assessment of Medications Program (CAMP)                    RECRUITMENT SUMMARY NOTE       Patient contacted [place] regarding  CAMP appointment. Patient rescheduled.       Jerolyn Center  Clinical Operations Specialist/CPhT  [employer] of [facility] (CAMP)  (P) [phone]

## 2020-01-24 NOTE — Unmapped (Unsigned)
[place] Assessment of Medications Program (CAMP) Clinic - COPD Best Practice Advisory (BPA) Alert Clinic    Logan Quinn, a 39 y.o. male who presents for {APJ Visit Type:35799} real-time audio and video visit with the CAMP Clinic as part of the COPD BPA Clinic for {CAMP BPA Clinic Visit Type:70140}.   PCP: [name], FNP    Recommendations to provider:  ??? ***     ASSESSMENT & PLAN     COPD    Patient reported treatment: see table below in subjective & objective     Assessment / Plan  Goal of visit to reduce exacerbations and symptoms through inhaler education.  ?? Specific recommendations to provider noted above  ?? Educated patient on proper inhaler use technique and indication for *** by using {BPA Clinic Education Tools:70601}. Patient was able to verbalize understanding and all questions answered at this time. BPA alert addressed within chart   ?? Reviewed importance medication adherence with patient   ?? Determined if affordability of and access to medications is a concern for the patient. ***  ?? ***Assessed necessary information to complete MMRC questionnaire. BPA alert addressed    ?? ***Identified that patient could benefit from a rescue inhaler, see recommendations to provider section above     ?? ***Spirometry needed? Copy statement into recommendations section: Patient identified as having a need for spirometry through a BPA. As a reminder, this BPA triggers for COPD registry patients that do not have a documented spirometry completed within the last 5 years. Recommend completion of in office spirometry or referral to facility capable of completing spirometry to confirm COPD diagnosis/severity, track worsening of disease, and satisfy the BPA.     _____________________________________________________    I spent *** minutes on the {phone audio video visit:67489} with the patient on the date of service. I spent an additional *** minutes on pre- and post-visit activities on the date of service. The patient was physically located in [place] or a state in which I am permitted to provide care. The patient and/or parent/guardian understood that s/he may incur co-pays and cost sharing, and agreed to the telemedicine visit. The visit was reasonable and appropriate under the circumstances given the patient's presentation at the time.    The patient and/or parent/guardian has been advised of the potential risks and limitations of this mode of treatment (including, but not limited to, the absence of in-person examination) and has agreed to be treated using telemedicine. The patient's/patient's family's questions regarding telemedicine have been answered.     If the visit was completed in an ambulatory setting, the patient and/or parent/guardian has also been advised to contact their provider???s office for worsening conditions, and seek emergency medical treatment and/or call 911 if the patient deems either necessary.      {campcomplete:63036}    Future scheduled follow up visits include the following. Notes and recommendations from today's visit {will/will not:32628} be {fax vs epic:39716} to [name], FNP  Future Appointments   Date Time Provider Department Center   01/24/2020  2:00 PM Lesia Sago [place], CPP [facility]   01/26/2020 10:40 AM [name] Rosita [place], FNP [facility]   02/21/2020 10:00 AM [name] Rosita Fire, FNP [facility]       ***     Subjective   SUBJECTIVE & OBJECTIVE:      COPD Medications:    Drug Class Medication   Rescue Inhaler Albuterol  inhaler   Rescue Inhaler Albuterol  nebulizer   Maintenance Inhaler  (LABA) {CAMP COPD TOC LABAs:62126} {inhaler/nebulizer:61785}   Maintenance Inhaler  (LAMA) {CAMPTOC COPD LAMAs:62127} {inhaler/nebulizer:61785}   Maintenance Inhaler  (LABA+LAMA) {COPD LABA+LAMA Ms:64328} {inhaler/nebulizer:61785}   Maintenance Inhaler  (ICS Combo) Salmeterol + fluticasone (Advair Diskus, Advair HFA, AirDuo RespiClick) inhaler     Affordability:  Patient states inhalers listed above {Actions; are/are not:16769} affordable to them at the time of this visit. ***    Adherence:   Patient {Blank single:19197::denies,reports,***} missed doses of ***. Reason(s) for nonadherence: {Adherence barriers _cbf:52877::There are no barriers to adherence identified at this time.}    MMRC  Most recent MMRC dyspnea scale: 3 Last MMRC date: 06/30/2017     HEALTH MAINTENANCE  Immunizations:  Immunization History   Administered Date(s) Administered   ??? Hepatitis B Vaccine, Unspecified Formulation 12/27/1999, 04/29/2000   ??? Hepatitis B, Adult 03/05/2013, 04/05/2013, 09/03/2013   ??? INFLUENZA INJ MDCK PF, Quad (Flucelvax)(4y and up Egg Free) 03/31/2019   ??? INFLUENZA TIV (TRI) PF (IM) 10/08/2011   ??? Influenza Vaccine Quad (IIV4 PF) 57mo+ injectable 03/05/2013, 03/18/2014, 03/22/2015, 03/05/2016, 03/19/2017, 02/23/2018   ??? Influenza Virus Vaccine, unspecified formulation 03/19/2017, 02/23/2018, 03/25/2019   ??? PNEUMOCOCCAL POLYSACCHARIDE 23 12/30/2012   ??? PPD Test 10/28/2016   ??? TdaP 07/18/2008, 07/16/2013   ??? Tetanus and diptheria,(adult), adsorbed, 2Lf tetanus toxoid, PF 04/29/2000       Smoking:  Social History     Tobacco Use   Smoking Status Never Smoker   Smokeless Tobacco Never Used        Current Outpatient Medications on File Prior to Visit   Medication Sig Dispense Refill   ??? acetaminophen (TYLENOL) 500 MG tablet Take 2 tablets (1,000 mg total) by mouth Three (3) times a day. 30 tablet 0   ??? ADVAIR DISKUS 500-50 mcg/dose diskus Inhale 1 puff 2 (two) times a day. 60 each 11   ??? albuterol 2.5 mg /3 mL (0.083 %) nebulizer solution Inhale 3 mL (2.5 mg total) by nebulization every four (4) hours as needed for wheezing. 180 mL 2   ??? albuterol HFA 90 mcg/actuation inhaler Inhale 2 puffs every six (6) hours as needed for wheezing. 3 Inhaler 1   ??? ALPRAZolam (XANAX) 0.5 MG tablet Take 1 tablet (0.5 mg total) by mouth Three (3) times a day. 90 tablet 1   ??? blood sugar diagnostic Strp by Other route Three (3) times a day before meals. 300 each 1   ??? blood-glucose meter kit Use as directed 1 each 0   ??? cephalexin (KEFLEX) 500 MG capsule Take 1 capsule (500 mg total) by mouth Four (4) times a day. 40 capsule 0   ??? cetirizine (ZYRTEC) 10 MG tablet Take 1 tablet (10 mg total) by mouth nightly as needed for allergies. 90 tablet 1   ??? chlorhexidine (PERIDEX) 0.12 % solution 15 mL by Mouth route Two (2) times a day. 437 mL 0   ??? clindamycin (CLEOCIN T) 1 % external solution Apply topically Two (2) times a day. 60 mL 4   ??? clobetasoL (TEMOVATE) 0.05 % ointment Apply daily to painful affected areas if needed for flares, then stop 15 g 1   ??? cyclobenzaprine (FLEXERIL) 10 MG tablet Take 1 tablet (10 mg total) by mouth Three (3) times a day as needed for muscle spasms. 60 tablet 0   ??? dextroamphetamine-amphetamine (ADDERALL) 20 mg tablet Take 1 tablet by mouth Two (2) times a day. 30 tablet 0   ??? dicyclomine (BENTYL)  10 mg capsule Take 1 capsule (10 mg total) by mouth Three (3) times a day. 270 capsule 1   ??? diphenoxylate-atropine (LOMOTIL) 2.5-0.025 mg per tablet Take 1 tablet by mouth two (2) times a day as needed for diarrhea. 30 tablet 5   ??? DULoxetine (CYMBALTA) 60 MG capsule Take 1 capsule (60 mg total) by mouth Two (2) times a day. 180 capsule 0   ??? empty container (SHARPS CONTAINER) Misc Use as directed to dispose of Humira needles 1 each 2   ??? empty container Misc Use as directed to dispose of Humira injections 1 each 2   ??? erenumab-aooe 140 mg/mL AtIn Inject 140 mg under the skin every twenty-eight (28) days. 1 mL 6   ??? [EXPIRED] ergocalciferol (DRISDOL) 1,250 mcg (50,000 unit) capsule Take 1 capsule (50,000 Units total) by mouth once a week. 4 capsule 2   ??? famotidine (PEPCID) 20 MG tablet Take 20 mg by mouth Two (2) times a day.     ??? fluticasone propionate (FLONASE) 50 mcg/actuation nasal spray 1 spray into each nostril Two (2) times a day. 16 g 5   ??? gabapentin (NEURONTIN) 300 MG capsule Take 300 mg by mouth Three (3) times a day.     ??? glipiZIDE (GLUCOTROL) 10 MG tablet Take 2 tablets (20 mg total) by mouth Two (2) times a day (30 minutes before a meal). 360 tablet 1   ??? HUMIRA PEN CITRATE FREE 40 MG/0.4 ML Inject the contents of 2 pens (80mg  total) under the skin every seven (7) days. 8 each 3   ??? hydrocortisone (PROCTOSOL HC) 2.5 % rectal cream Insert 1 application into the rectum two (2) times a day as needed. 28.35 g 2   ??? hydrOXYzine (ATARAX) 25 MG tablet TAKE 1/2 TO 4 TABLETS BY MOUTH UP TO THREE TIMES DAILY AS NEEDED FOR ANXIETY     ??? ibuprofen (ADVIL,MOTRIN) 800 MG tablet Take 1 tablet (800 mg total) by mouth every eight (8) hours as needed. 90 tablet 1   ??? insulin glargine (LANTUS U-100 INSULIN) 100 unit/mL injection Inject 0.9 mL (90 Units total) under the skin nightly. 90 mL 3   ??? insulin regular (HUMULIN R REGULAR U-100 INSULN) 100 unit/mL injection SLIDING SCALE BLOOD SUGAR 0-150=0 UNITS,150-200=3 UNITS, 200-250=6 UNITS,250-300=9 UNITS, 300-350=12 UNITS 40 mL 3   ??? insulin syringe-needle U-100 (BD INSULIN SYRINGE ULTRA-FINE) 1 mL 31 gauge x 5/16 (8 mm) Syrg Use up to 5 x daily to administer insulin. 100 each 1   ??? ketoconazole (NIZORAL) 2 % cream Apply 1 application topically daily. 15 g 5   ??? lancets Misc 1 each by Miscellaneous route Three (3) times a day before meals. 300 each 1   ??? levothyroxine (SYNTHROID) 50 MCG tablet Take 1 tablet (50 mcg total) by mouth daily. 90 tablet 1   ??? lidocaine (XYLOCAINE) 5 % ointment lidocaine 5 % topical ointment     ??? miscellaneous medical supply Misc Gauze pads 4 x 4 inches and paper tape 30 each 0   ??? miscellaneous medical supply Misc Nebulizer- Use as directed with albuterol nebs for SOB 1 each 0   ??? [EXPIRED] MITIGARE 0.6 mg cap capsule Take 1.2 mg on Day 1 of flare, followed by 0.6 mg one hour later. Day 2 take 0.6 mg BID until flare resolves. 60 capsule 3   ??? montelukast (SINGULAIR) 10 mg tablet Take 1 tablet (10 mg total) by mouth daily. 90 each 1   ??? nystatin (MYCOSTATIN)  100,000 unit/gram powder Apply to affected area 3 times daily 30 g 1   ??? oxybutynin (DITROPAN) 5 MG tablet Take 1 tablet (5 mg total) by mouth Two (2) times a day. 180 tablet 1   ??? pantoprazole (PROTONIX) 40 MG tablet Take 1 tablet (40 mg total) by mouth Two (2) times a day. 180 tablet 1   ??? polyethylene glycol (GLYCOLAX) 17 gram/dose powder take 17GM (DISSOLVED IN WATER) by mouth once daily     ??? PROAIR HFA 90 mcg/actuation inhaler Inhale 2 puffs every four (4) hours as needed for wheezing. 3 Inhaler 1   ??? propranoloL (INDERAL LA) 120 mg 24 hr capsule Take 1 capsule (120 mg total) by mouth daily. 90 capsule 1   ??? sour cherry extract (TART CHERRY EXTRACT) 1,000 mg cap      ??? topiramate (TOPAMAX) 50 MG tablet Take 1.5 tablets (75 mg total) by mouth Two (2) times a day. 270 tablet 1   ??? traZODone (DESYREL) 100 MG tablet Take 2 tablets (200 mg total) by mouth nightly. 180 tablet 0     No current facility-administered medications on file prior to visit.

## 2020-01-24 NOTE — Unmapped (Signed)
Reviewed report

## 2020-01-25 MED ORDER — INSULIN PUMP CARTRIDGE SUBCUTANEOUS
Freq: Every day | SUBCUTANEOUS | 5 refills | 0 days | Status: CP
Start: 2020-01-25 — End: ?

## 2020-01-25 MED FILL — HUMIRA PEN CITRATE FREE 40 MG/0.4 ML: 28 days supply | Qty: 8 | Fill #2

## 2020-01-25 MED FILL — HUMIRA PEN CITRATE FREE 40 MG/0.4 ML: 28 days supply | Qty: 8 | Fill #2 | Status: AC

## 2020-01-25 NOTE — Unmapped (Signed)
I sent in Rx, not sure if I did it correctly as I have never ordered it for Omnipod before.

## 2020-01-25 NOTE — Unmapped (Unsigned)
Assessment and Plan:     There are no diagnoses linked to this encounter.     HGB A1c 8.7 two months ago.    DM {controlled/uncontrolled:77631}. Continue glipizide 20 mg BID, Lantus??90??units HS and Humulin at every meal as with sliding scale.  Encouraged patient to continue carb controlled diet and regular exercise.     Barriers to goals identified and addressed. Pertinent handouts were given today and reviewed with the patient as indicated.  The Care Plan and Self-Management goals have been included on the AVS and the AVS has been printed. I encouraged the patient to keep regular logs for me to review at their next visit. Any outside resources or referrals needed at this time are noted above. Patient's current medications have been reviewed. Any new medications prescribed have been discussed, and side effects have been addressed.  Have assessed the patient's understanding, response, and barriers to adherence to medications. Patient voiced understanding and all questions have been answered to satisfaction.     I personally spent *** minutes face-to-face and non-face-to-face in the care of this patient, which includes all pre, intra, and post visit time on the date of service.  Over 50% spent in face to face counseling and education regarding ***.    No follow-ups on file.    Subjective:      Logan Quinn is a 39 y.o. male being seen for a comprehensive physical exam.        No chief complaint on file.    Lifestyle:  Employment:  Exercise:   Diet:   Caffeine Use:  Last Dentist:  Last Vision:    HPI:    Diabetes: Diabetes has customarily {been/not been:38678} at goal (complicated by: ***).  Current symptoms include: {dm sx:14075}. Symptoms have {symptom progression:19445}. Patient denies {dm sx:19199}. Evaluation to date has included: {dm labs:([phone] 120-1198}.  Home sugars: {dm home sugars:14018}. Current treatment: Continued glipizide and insulin  which has been {effective/ineffective:14021}.  Doing regular exercise: {yes no:22180}.     {kerichronicdx:74702}    {keriacutedx:74703}    PHQ-2 Score:       ASCVD risk:  The ASCVD Risk score Denman George DC Jr., et al., 2013) failed to calculate for the following reasons:    The 2013 ASCVD risk score is only valid for ages 5 to 71    Note: For patients with SBP <90 or >200, Total Cholesterol <130 or >320, HDL <20 or >100 which are outside of the allowable range, the calculator will use these upper or lower values to calculate the patient???s risk score.         ROS:     General: no fatigue, excess weight loss or gain, overall feels well***  ENT: denies ear symptoms, thoat symptoms, nasal congestion***  Cardiovascular: denies chest pain, palpitations, tachycardia***  Respiratory: denies, dyspnea, dyspnea on exertion, orthopnea, wheezing, cough***  Gastrointestinal: denies nausea, vomiting, dyspepsia.  No abdominal pain, chronic constipation or diarrhea, melena, hematochezia***  Genitourinary: denies urinary difficulties, erectile dysfunction***  Musculoskeletal: denies significant joint pains, muscle pain or weakness***  Integumentary: denies rashes  or other skin problems***  Neurological: denies headaches, dizziness, numbness, tingling, syncope***  Psychological: denies symptoms suggesting depression, anxiety, sleep disturbance***    Social History:     Social History     Tobacco Use   ??? Smoking status: Never Smoker   ??? Smokeless tobacco: Never Used   Vaping Use   ??? Vaping Use: Never used   Substance Use Topics   ??? Alcohol use:  Never     Alcohol/week: 0.0 standard drinks     Comment: rare social   ??? Drug use: Never       Past Medical/Surgical History:     Past Medical History:   Diagnosis Date   ??? Acne    ??? Allergic    ??? Anxiety    ??? Depression    ??? Diabetes mellitus (CMS-HCC)    ??? GERD (gastroesophageal reflux disease)    ??? Gout    ??? Hypertension    ??? IBS (irritable bowel syndrome)    ??? 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 Liver disease    ??? Migraines    ??? Morbid obesity with BMI of 60.0-69.9, adult (CMS-HCC)    ??? 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 Obstructive sleep apnea    ??? OSA on CPAP    ??? Severe obstructive sleep apnea    ??? Trapezius muscle strain 12/07/2013   ??? Urinary incontinence, nocturnal enuresis    ??? Venous insufficiency      Past Surgical History:   Procedure Laterality Date   ??? EYE SURGERY  11/15   ??? PR COLONOSCOPY FLX DX W/COLLJ SPEC WHEN PFRMD N/A 03/24/2013    Procedure: COLONOSCOPY, FLEXIBLE, PROXIMAL TO SPLENIC FLEXURE; DIAGNOSTIC, W/WO COLLECTION SPECIMEN BY BRUSH OR WASH;  Surgeon: Clint Bolder, MD;  [facility];  Service: Gastroenterology   ??? PR EYE SURG POST SGMT PROC UNLISTED Left     pneumatic retinopexy OS   ??? PR UPPER GI ENDOSCOPY,DIAGNOSIS N/A 02/02/2013    Procedure: UGI ENDO, INCLUDE ESOPHAGUS, STOMACH, & DUODENUM &/OR JEJUNUM; DX W/WO COLLECTION SPECIMN, BY BRUSH OR WASH;  Surgeon: Malcolm Metro, MD;  [facility];  Service: Gastroenterology   ??? [place] PYLORIC STENOSIS (Point HISTORICAL RESULT)         Family History:     Family History   Problem Relation Age of Onset   ??? Cancer Maternal Grandfather    ??? Hearing loss Maternal Grandfather    ??? Cancer Paternal Grandfather    ??? COPD Paternal Grandmother    ??? Arthritis Paternal Grandmother    ??? Depression Paternal Grandmother    ??? Diabetes Mother    ??? Heart disease Mother    ??? Migraines Mother    ??? Arthritis Mother    ??? Depression Mother    ??? GER disease Mother    ??? Hypertension Mother    ??? Angina Mother    ??? COPD Mother    ??? Glaucoma Mother    ??? Hearing loss Mother    ??? Diabetes Sister    ??? Migraines Sister    ??? Asthma Sister    ??? Depression Sister    ??? Hearing loss Sister    ??? Diabetes Brother    ??? Asthma Brother    ??? Diabetes Maternal Grandmother    ??? Heart disease Maternal Grandmother    ??? Migraines Maternal Grandmother    ??? Depression Maternal Grandmother    ??? Angina Maternal Grandmother    ??? Hypertension Maternal Grandmother    ??? Stroke Maternal Grandmother    ??? Diabetes Maternal Uncle    ??? Hearing loss Maternal Uncle    ??? Diabetes Maternal Uncle    ??? Liver disease Maternal Uncle    ??? Kidney disease Maternal Uncle    ??? Asthma Brother    ??? Colorectal Cancer Neg Hx    ??? Esophageal cancer Neg  Hx    ??? Liver cancer Neg Hx    ??? Pancreatic cancer Neg Hx    ??? Stomach cancer Neg Hx    ??? Amblyopia Neg Hx    ??? Blindness Neg Hx    ??? Retinal detachment Neg Hx    ??? Strabismus Neg Hx    ??? Macular degeneration Neg Hx    ??? Anesthesia problems Neg Hx    ??? Broken bones Neg Hx    ??? Clotting disorder Neg Hx    ??? Collagen disease Neg Hx    ??? Dislocations Neg Hx    ??? Fibromyalgia Neg Hx    ??? Gout Neg Hx    ??? Hemophilia Neg Hx    ??? Osteoporosis Neg Hx    ??? Rheumatologic disease Neg Hx    ??? Scoliosis Neg Hx    ??? Severe sprains Neg Hx    ??? Sickle cell anemia Neg Hx    ??? Spinal Compression Fracture Neg Hx    ??? Melanoma Neg Hx    ??? Basal cell carcinoma Neg Hx    ??? Squamous cell carcinoma Neg Hx    ??? Deep vein thrombosis Neg Hx        Allergies:     Erythromycin, Penicillins, Sulfa (sulfonamide antibiotics), Other, Erythromycin base, and Sulfasalazine    Current Medications:     Current Outpatient Medications   Medication Sig Dispense Refill   ??? acetaminophen (TYLENOL) 500 MG tablet Take 2 tablets (1,000 mg total) by mouth Three (3) times a day. 30 tablet 0   ??? ADVAIR DISKUS 500-50 mcg/dose diskus Inhale 1 puff 2 (two) times a day. 60 each 11   ??? albuterol 2.5 mg /3 mL (0.083 %) nebulizer solution Inhale 3 mL (2.5 mg total) by nebulization every four (4) hours as needed for wheezing. 180 mL 2   ??? albuterol HFA 90 mcg/actuation inhaler Inhale 2 puffs every six (6) hours as needed for wheezing. 3 Inhaler 1   ??? ALPRAZolam (XANAX) 0.5 MG tablet Take 1 tablet (0.5 mg total) by mouth Three (3) times a day. 90 tablet 1   ??? blood sugar diagnostic Strp by Other route Three (3) times a day before meals. 300 each 1   ??? blood-glucose meter kit Use as directed 1 each 0   ??? cephalexin (KEFLEX) 500 MG capsule Take 1 capsule (500 mg total) by mouth Four (4) times a day. 40 capsule 0   ??? cetirizine (ZYRTEC) 10 MG tablet Take 1 tablet (10 mg total) by mouth nightly as needed for allergies. 90 tablet 1   ??? chlorhexidine (PERIDEX) 0.12 % solution 15 mL by Mouth route Two (2) times a day. 437 mL 0   ??? clindamycin (CLEOCIN T) 1 % external solution Apply topically Two (2) times a day. 60 mL 4   ??? clobetasoL (TEMOVATE) 0.05 % ointment Apply daily to painful affected areas if needed for flares, then stop 15 g 1   ??? cyclobenzaprine (FLEXERIL) 10 MG tablet Take 1 tablet (10 mg total) by mouth Three (3) times a day as needed for muscle spasms. 60 tablet 0   ??? dextroamphetamine-amphetamine (ADDERALL) 20 mg tablet Take 1 tablet by mouth Two (2) times a day. 30 tablet 0   ??? dicyclomine (BENTYL) 10 mg capsule Take 1 capsule (10 mg total) by mouth Three (3) times a day. 270 capsule 1   ??? diphenoxylate-atropine (LOMOTIL) 2.5-0.025 mg per tablet Take 1 tablet by mouth two (2) times a  day as needed for diarrhea. 30 tablet 5   ??? DULoxetine (CYMBALTA) 60 MG capsule Take 1 capsule (60 mg total) by mouth Two (2) times a day. 180 capsule 0   ??? empty container (SHARPS CONTAINER) Misc Use as directed to dispose of Humira needles 1 each 2   ??? empty container Misc Use as directed to dispose of Humira injections 1 each 2   ??? erenumab-aooe 140 mg/mL AtIn Inject 140 mg under the skin every twenty-eight (28) days. 1 mL 6   ??? famotidine (PEPCID) 20 MG tablet Take 20 mg by mouth Two (2) times a day.     ??? fluticasone propionate (FLONASE) 50 mcg/actuation nasal spray 1 spray into each nostril Two (2) times a day. 16 g 5   ??? gabapentin (NEURONTIN) 300 MG capsule Take 300 mg by mouth Three (3) times a day.     ??? glipiZIDE (GLUCOTROL) 10 MG tablet Take 2 tablets (20 mg total) by mouth Two (2) times a day (30 minutes before a meal). 360 tablet 1   ??? HUMIRA PEN CITRATE FREE 40 MG/0.4 ML Inject the contents of 2 pens (80mg  total) under the skin every seven (7) days. 8 each 3   ??? hydrocortisone (PROCTOSOL HC) 2.5 % rectal cream Insert 1 application into the rectum two (2) times a day as needed. 28.35 g 2   ??? hydrOXYzine (ATARAX) 25 MG tablet TAKE 1/2 TO 4 TABLETS BY MOUTH UP TO THREE TIMES DAILY AS NEEDED FOR ANXIETY     ??? ibuprofen (ADVIL,MOTRIN) 800 MG tablet Take 1 tablet (800 mg total) by mouth every eight (8) hours as needed. 90 tablet 1   ??? insulin glargine (LANTUS U-100 INSULIN) 100 unit/mL injection Inject 0.9 mL (90 Units total) under the skin nightly. 90 mL 3   ??? insulin pump cartridge Crtg Inject 120 Units under the skin daily. 5 each 5   ??? insulin regular (HUMULIN R REGULAR U-100 INSULN) 100 unit/mL injection SLIDING SCALE BLOOD SUGAR 0-150=0 UNITS,150-200=3 UNITS, 200-250=6 UNITS,250-300=9 UNITS, 300-350=12 UNITS 40 mL 3   ??? insulin syringe-needle U-100 (BD INSULIN SYRINGE ULTRA-FINE) 1 mL 31 gauge x 5/16 (8 mm) Syrg Use up to 5 x daily to administer insulin. 100 each 1   ??? ketoconazole (NIZORAL) 2 % cream Apply 1 application topically daily. 15 g 5   ??? lancets Misc 1 each by Miscellaneous route Three (3) times a day before meals. 300 each 1   ??? levothyroxine (SYNTHROID) 50 MCG tablet Take 1 tablet (50 mcg total) by mouth daily. 90 tablet 1   ??? lidocaine (XYLOCAINE) 5 % ointment lidocaine 5 % topical ointment     ??? miscellaneous medical supply Misc Gauze pads 4 x 4 inches and paper tape 30 each 0   ??? miscellaneous medical supply Misc Nebulizer- Use as directed with albuterol nebs for SOB 1 each 0   ??? montelukast (SINGULAIR) 10 mg tablet Take 1 tablet (10 mg total) by mouth daily. 90 each 1   ??? nystatin (MYCOSTATIN) 100,000 unit/gram powder Apply to affected area 3 times daily 30 g 1   ??? oxybutynin (DITROPAN) 5 MG tablet Take 1 tablet (5 mg total) by mouth Two (2) times a day. 180 tablet 1   ??? pantoprazole (PROTONIX) 40 MG tablet Take 1 tablet (40 mg total) by mouth Two (2) times a day. 180 tablet 1   ??? polyethylene glycol (GLYCOLAX) 17 gram/dose powder take 17GM (DISSOLVED IN WATER) by mouth once daily     ???  PROAIR HFA 90 mcg/actuation inhaler Inhale 2 puffs every four (4) hours as needed for wheezing. 3 Inhaler 1   ??? propranoloL (INDERAL LA) 120 mg 24 hr capsule Take 1 capsule (120 mg total) by mouth daily. 90 capsule 1   ??? sour cherry extract (TART CHERRY EXTRACT) 1,000 mg cap      ??? topiramate (TOPAMAX) 50 MG tablet Take 1.5 tablets (75 mg total) by mouth Two (2) times a day. 270 tablet 1   ??? traZODone (DESYREL) 100 MG tablet Take 2 tablets (200 mg total) by mouth nightly. 180 tablet 0     No current facility-administered medications for this visit.       Health Maintenance:     Health Maintenance   Topic Date Due   ??? COPD Spirometry  Never done   ??? COVID-19 Vaccine (1) Never done   ??? Influenza Vaccine (1) 02/09/2020   ??? Hemoglobin A1c  02/18/2020   ??? Potassium Monitoring  04/24/2020   ??? Retinal Eye Exam  06/09/2020   ??? Urine Albumin/Creatinine Ratio  09/16/2020   ??? Serum Creatinine Monitoring  09/16/2020   ??? Foot Exam  11/17/2020   ??? DTaP/Tdap/Td Vaccines (4 - Td or Tdap) 07/17/2023   ??? Hepatitis C Screen  Completed   ??? Pneumococcal Vaccine  Completed       Immunizations:     Immunization History   Administered Date(s) Administered   ??? Hepatitis B Vaccine, Unspecified Formulation 12/27/1999, 04/29/2000   ??? Hepatitis B, Adult 03/05/2013, 04/05/2013, 09/03/2013   ??? INFLUENZA INJ MDCK PF, Quad (Flucelvax)(4y and up Egg Free) 03/31/2019   ??? INFLUENZA TIV (TRI) PF (IM) 10/08/2011   ??? Influenza Vaccine Quad (IIV4 PF) 81mo+ injectable 03/05/2013, 03/18/2014, 03/22/2015, 03/05/2016, 03/19/2017, 02/23/2018   ??? Influenza Virus Vaccine, unspecified formulation 03/19/2017, 02/23/2018, 03/25/2019   ??? PNEUMOCOCCAL POLYSACCHARIDE 23 12/30/2012   ??? PPD Test 10/28/2016   ??? TdaP 07/18/2008, 07/16/2013   ??? Tetanus and diptheria,(adult), adsorbed, 2Lf tetanus toxoid, PF 04/29/2000     I have reviewed and (if needed) updated the patient's problem list, medications, allergies, past medical and surgical history, social and family history.  Vital Signs:     Wt Readings from Last 3 Encounters:   11/18/19 (!) 233.1 kg (514 lb)   09/29/19 (!) 228.2 kg (503 lb)   09/17/19 (!) 230.4 kg (508 lb)     Temp Readings from Last 3 Encounters:   11/18/19 36.6 ??C (97.9 ??F) (Oral)   09/29/19 36.6 ??C (97.8 ??F) (Oral)   09/17/19 36.7 ??C (98 ??F) (Oral)     BP Readings from Last 3 Encounters:   11/18/19 128/68   09/29/19 124/66   09/17/19 128/61     Pulse Readings from Last 3 Encounters:   11/18/19 55   09/29/19 61   09/17/19 80     Estimated body mass index is 71.72 kg/m?? as calculated from the following:    Height as of 11/18/19: 180.3 cm (5' 10.98).    Weight as of 11/18/19: 233.1 kg (514 lb).  No height and weight on file for this encounter.        Objective:      General Appearance: Alert, cooperative, no distress, appears stated age.***  EYES: PERRL, conjunctiva/corneas clear, EOM's intact, fundi  benign, both eyes***  ENT:  External canals clear, Tympanic membrane pearly grey with normal light reflex bilaterally. No oropharyngeal lesions, mucous membranes moist.***  NECK: No carotid bruits.  No palpable cervical or supraclavicular lymphadenopathy. Thyroid  smooth, normal size***  CV: Regular rate and rhythm. Normal S1 and S2. No murmurs, gallops, or rubs***  RESP: Normal respiratory effort.  Clear to auscultation bilaterally without wheezes, rhonchi or crackles.***  GI: Normal abdominal bowel sounds. Soft, non-tender and non-distended.***  ZY:SAYTKZ external male genitalia, no testicular masses, no hernias. Prostate smooth, symmetric, normal size, no nodules.Normal rectal tone, heme negative stool. No external hemorrhoids.***  EXT:  No lower extremity edema. Posterior tibial pulses and dorsalis pedis pulses are 2+ and symmetric.***  MSK: Gait and station unremarkable. Normal ROM major joints. Normal strength and tone of proximal muscles.***  SKIN: No rashes or suspicious focal lesions noted.***  LYMPH NODES: Cervical, supraclavicular, and axillary nodes normal.***  NEURO: Cranial nerves II- XII grossly intact.  No focal neurologic deficits.***  PSYCHIATRIC: Alert and oriented x 3. Mood normal.***     Labs:     No results found for this visit on 01/26/20.      I attest that I, Bayard Hugger, personally documented this note while acting as scribe for [name], FNP.      Bayard Hugger, Scribe.  01/26/2020     The documentation recorded by the scribe accurately reflects the service I personally performed and the decisions made by me.    [name], FNP

## 2020-01-25 NOTE — Unmapped (Incomplete)
Patient Education        Well Visit, Ages 63 to 37: Care Instructions  Overview     Well visits can help you stay healthy. Your doctor has checked your overall health and may have suggested ways to take good care of yourself. Your doctor also may have recommended tests. At home, you can help prevent illness with healthy eating, regular exercise, and other steps.  Follow-up care is a key part of your treatment and safety. Be sure to make and go to all appointments, and call your doctor if you are having problems. It's also a good idea to know your test results and keep a list of the medicines you take.  How can you care for yourself at home?  ?? Get screening tests that you and your doctor decide on. Screening helps find diseases before any symptoms appear.  ?? Eat healthy foods. Choose fruits, vegetables, whole grains, protein, and low-fat dairy foods. Limit fat, especially saturated fat. Reduce salt in your diet.  ?? Limit alcohol. If you are a man, have no more than 2 drinks a day or 14 drinks a week. If you are a woman, have no more than 1 drink a day or 7 drinks a week.  ?? Get at least 30 minutes of physical activity on most days of the week. Walking is a good choice. You also may want to do other activities, such as running, swimming, cycling, or playing tennis or team sports. Discuss any changes in your exercise program with your doctor.  ?? Reach and stay at a healthy weight. This will lower your risk for many problems, such as obesity, diabetes, heart disease, and high blood pressure.  ?? Do not smoke or allow others to smoke around you. If you need help quitting, talk to your doctor about stop-smoking programs and medicines. These can increase your chances of quitting for good.  ?? Care for your mental health. It is easy to get weighed down by worry and stress. Learn strategies to manage stress, like deep breathing and mindfulness, and stay connected with your family and community. If you find you often feel sad or hopeless, talk with your doctor. Treatment can help.  ?? Talk to your doctor about whether you have any risk factors for sexually transmitted infections (STIs). You can help prevent STIs if you wait to have sex with a new partner (or partners) until you've each been tested for STIs. It also helps if you use condoms (male or male condoms) and if you limit your sex partners to one person who only has sex with you. Vaccines are available for some STIs, such as HPV.  ?? Use birth control if it's important to you to prevent pregnancy. Talk with your doctor about the choices available and what might be best for you.  ?? If you think you may have a problem with alcohol or drug use, talk to your doctor. This includes prescription medicines (such as amphetamines and opioids) and illegal drugs (such as cocaine and methamphetamine). Your doctor can help you figure out what type of treatment is best for you.  ?? Protect your skin from too much sun. When you're outdoors from 10 a.m. to 4 p.m., stay in the shade or cover up with clothing and a hat with a wide brim. Wear sunglasses that block UV rays. Even when it's cloudy, put broad-spectrum sunscreen (SPF 30 or higher) on any exposed skin.  ?? See a dentist one or two times a year  for checkups and to have your teeth cleaned.  ?? Wear a seat belt in the car.  When should you call for help?  Watch closely for changes in your health, and be sure to contact your doctor if you have any problems or symptoms that concern you.  Where can you learn more?  Go to [facility] at https://myuncchart.org  Select Patient Education under [employer]. Enter P072 in the search box to learn more about Well Visit, Ages 77 to 65: Care Instructions.  Current as of: Nov 04, 2018??????????????????????????????Content Version: 12.9  ?? 2006-2021 [employer].   Care instructions adapted under license by [facility]. If you have questions about a medical condition or this instruction, always ask your healthcare professional. [employer] disclaims any warranty or liability for your use of this information.

## 2020-01-26 ENCOUNTER — Ambulatory Visit: Admit: 2020-01-26 | Payer: MEDICAID | Attending: Family | Primary: Family

## 2020-01-27 NOTE — Unmapped (Unsigned)
[facility] ([facility]) [facility] - COPD Best Practice Advisory (BPA) Alert Clinic    Logan Quinn, a 39 y.o. male who presents for {APJ Visit Type:35799} real-time audio and video visit with the CAMP Clinic as part of the COPD BPA Clinic for {CAMP BPA Clinic Visit Type:70140}.   PCP: [name], FNP      Recommendations to provider:  ?? Patient identified as having a need for spirometry through a BPA.   ???      ASSESSMENT & PLAN     COPD    Patient reported treatment: see table below in subjective & objective     Assessment / Plan  Goal of visit to reduce exacerbations and symptoms through inhaler education.  ?? Specific recommendations to provider noted above  ?? Educated patient on proper inhaler use technique and indication for *** by using {BPA Clinic Education Tools:70601}. Patient was able to verbalize understanding and all questions answered at this time. BPA alert addressed within chart   ?? Reviewed importance medication adherence with patient   ?? Determined if affordability of and access to medications is a concern for the patient. ***  ?? ***Assessed necessary information to complete MMRC questionnaire. BPA alert addressed    ?? ***Encouraged patient to complete spirometry as ordered 11/18/19  ?? ***Spirometry needed? Copy statement into recommendations section: Patient identified as having a need for spirometry through a BPA. As a reminder, this BPA triggers for COPD registry patients that do not have a documented spirometry completed within the last 5 years. Recommend completion of in office spirometry or referral to facility capable of completing spirometry to confirm COPD diagnosis/severity, track worsening of disease, and satisfy the BPA.     _____________________________________________________    I spent *** minutes on the {phone audio video visit:67489} with the patient on the date of service. I spent an additional *** minutes on pre- and post-visit activities on the date of service.     The patient was physically located in [place] or a state in which I am permitted to provide care. The patient and/or parent/guardian understood that s/he may incur co-pays and cost sharing, and agreed to the telemedicine visit. The visit was reasonable and appropriate under the circumstances given the patient's presentation at the time.    The patient and/or parent/guardian has been advised of the potential risks and limitations of this mode of treatment (including, but not limited to, the absence of in-person examination) and has agreed to be treated using telemedicine. The patient's/patient's family's questions regarding telemedicine have been answered.     If the visit was completed in an ambulatory setting, the patient and/or parent/guardian has also been advised to contact their provider???s office for worsening conditions, and seek emergency medical treatment and/or call 911 if the patient deems either necessary.      {campcomplete:63036}    Future scheduled follow up visits include the following. Notes and recommendations from today's visit {will/will not:32628} be {fax vs epic:39716} to [name], FNP  Future Appointments   Date Time Provider Department Center   01/31/2020  1:45 PM Lesia Sago [place], CPP [facility]   02/21/2020 10:00 AM [name] Rosita Fire, FNP [facility]   05/24/2020 10:40 AM [name] Rosita Fire, FNP [facility]       ***     Subjective   SUBJECTIVE & OBJECTIVE:      COPD Medications:    Drug Class Medication   Rescue  Inhaler Albuterol  inhaler   Rescue Inhaler Albuterol  nebulizer   Maintenance Inhaler  (LABA) {CAMP COPD TOC LABAs:62126} {inhaler/nebulizer:61785}   Maintenance Inhaler  (LAMA) {CAMPTOC COPD LAMAs:62127} {inhaler/nebulizer:61785}   Maintenance Inhaler  (LABA+LAMA) {COPD LABA+LAMA Ms:64328} {inhaler/nebulizer:61785}   Maintenance Inhaler  (ICS Combo) Salmeterol + fluticasone (Advair Diskus, Advair HFA, AirDuo RespiClick)   inhaler     Affordability:  Patient states inhalers listed above {Actions; are/are not:16769} affordable to them at the time of this visit. ***    Adherence:   Patient {Blank single:19197::denies,reports,***} missed doses of ***. Reason(s) for nonadherence: {Adherence barriers _cbf:52877::There are no barriers to adherence identified at this time.}    MMRC  Most recent MMRC dyspnea scale: 3 Last MMRC date: 06/30/2017     HEALTH MAINTENANCE  Immunizations:  Immunization History   Administered Date(s) Administered   ??? Hepatitis B Vaccine, Unspecified Formulation 12/27/1999, 04/29/2000   ??? Hepatitis B, Adult 03/05/2013, 04/05/2013, 09/03/2013   ??? INFLUENZA INJ MDCK PF, Quad (Flucelvax)(4y and up Egg Free) 03/31/2019   ??? INFLUENZA TIV (TRI) PF (IM) 10/08/2011   ??? Influenza Vaccine Quad (IIV4 PF) 33mo+ injectable 03/05/2013, 03/18/2014, 03/22/2015, 03/05/2016, 03/19/2017, 02/23/2018   ??? Influenza Virus Vaccine, unspecified formulation 03/19/2017, 02/23/2018, 03/25/2019   ??? PNEUMOCOCCAL POLYSACCHARIDE 23 12/30/2012   ??? PPD Test 10/28/2016   ??? TdaP 07/18/2008, 07/16/2013   ??? Tetanus and diptheria,(adult), adsorbed, 2Lf tetanus toxoid, PF 04/29/2000       Smoking:  Social History     Tobacco Use   Smoking Status Never Smoker   Smokeless Tobacco Never Used        Current Outpatient Medications on File Prior to Visit   Medication Sig Dispense Refill   ??? acetaminophen (TYLENOL) 500 MG tablet Take 2 tablets (1,000 mg total) by mouth Three (3) times a day. 30 tablet 0   ??? ADVAIR DISKUS 500-50 mcg/dose diskus Inhale 1 puff 2 (two) times a day. 60 each 11   ??? albuterol 2.5 mg /3 mL (0.083 %) nebulizer solution Inhale 3 mL (2.5 mg total) by nebulization every four (4) hours as needed for wheezing. 180 mL 2   ??? albuterol HFA 90 mcg/actuation inhaler Inhale 2 puffs every six (6) hours as needed for wheezing. 3 Inhaler 1   ??? ALPRAZolam (XANAX) 0.5 MG tablet Take 1 tablet (0.5 mg total) by mouth Three (3) times a day. 90 tablet 1   ??? blood sugar diagnostic Strp by Other route Three (3) times a day before meals. 300 each 1   ??? blood-glucose meter kit Use as directed 1 each 0   ??? cephalexin (KEFLEX) 500 MG capsule Take 1 capsule (500 mg total) by mouth Four (4) times a day. 40 capsule 0   ??? cetirizine (ZYRTEC) 10 MG tablet Take 1 tablet (10 mg total) by mouth nightly as needed for allergies. 90 tablet 1   ??? chlorhexidine (PERIDEX) 0.12 % solution 15 mL by Mouth route Two (2) times a day. 437 mL 0   ??? clindamycin (CLEOCIN T) 1 % external solution Apply topically Two (2) times a day. 60 mL 4   ??? clobetasoL (TEMOVATE) 0.05 % ointment Apply daily to painful affected areas if needed for flares, then stop 15 g 1   ??? cyclobenzaprine (FLEXERIL) 10 MG tablet Take 1 tablet (10 mg total) by mouth Three (3) times a day as needed for muscle spasms. 60 tablet 0   ??? dextroamphetamine-amphetamine (ADDERALL) 20 mg tablet Take 1 tablet by mouth Two (  2) times a day. 30 tablet 0   ??? dicyclomine (BENTYL) 10 mg capsule Take 1 capsule (10 mg total) by mouth Three (3) times a day. 270 capsule 1   ??? diphenoxylate-atropine (LOMOTIL) 2.5-0.025 mg per tablet Take 1 tablet by mouth two (2) times a day as needed for diarrhea. 30 tablet 5   ??? DULoxetine (CYMBALTA) 60 MG capsule Take 1 capsule (60 mg total) by mouth Two (2) times a day. 180 capsule 0   ??? empty container (SHARPS CONTAINER) Misc Use as directed to dispose of Humira needles 1 each 2   ??? empty container Misc Use as directed to dispose of Humira injections 1 each 2   ??? erenumab-aooe 140 mg/mL AtIn Inject 140 mg under the skin every twenty-eight (28) days. 1 mL 6   ??? [EXPIRED] ergocalciferol (DRISDOL) 1,250 mcg (50,000 unit) capsule Take 1 capsule (50,000 Units total) by mouth once a week. 4 capsule 2   ??? famotidine (PEPCID) 20 MG tablet Take 20 mg by mouth Two (2) times a day.     ??? fluticasone propionate (FLONASE) 50 mcg/actuation nasal spray 1 spray into each nostril Two (2) times a day. 16 g 5   ??? gabapentin (NEURONTIN) 300 MG capsule Take 300 mg by mouth Three (3) times a day.     ??? glipiZIDE (GLUCOTROL) 10 MG tablet Take 2 tablets (20 mg total) by mouth Two (2) times a day (30 minutes before a meal). 360 tablet 1   ??? HUMIRA PEN CITRATE FREE 40 MG/0.4 ML Inject the contents of 2 pens (80mg  total) under the skin every seven (7) days. 8 each 3   ??? hydrocortisone (PROCTOSOL HC) 2.5 % rectal cream Insert 1 application into the rectum two (2) times a day as needed. 28.35 g 2   ??? hydrOXYzine (ATARAX) 25 MG tablet TAKE 1/2 TO 4 TABLETS BY MOUTH UP TO THREE TIMES DAILY AS NEEDED FOR ANXIETY     ??? ibuprofen (ADVIL,MOTRIN) 800 MG tablet Take 1 tablet (800 mg total) by mouth every eight (8) hours as needed. 90 tablet 1   ??? insulin glargine (LANTUS U-100 INSULIN) 100 unit/mL injection Inject 0.9 mL (90 Units total) under the skin nightly. 90 mL 3   ??? insulin pump cartridge Crtg Inject 120 Units under the skin daily. 5 each 5   ??? insulin regular (HUMULIN R REGULAR U-100 INSULN) 100 unit/mL injection SLIDING SCALE BLOOD SUGAR 0-150=0 UNITS,150-200=3 UNITS, 200-250=6 UNITS,250-300=9 UNITS, 300-350=12 UNITS 40 mL 3   ??? insulin syringe-needle U-100 (BD INSULIN SYRINGE ULTRA-FINE) 1 mL 31 gauge x 5/16 (8 mm) Syrg Use up to 5 x daily to administer insulin. 100 each 1   ??? ketoconazole (NIZORAL) 2 % cream Apply 1 application topically daily. 15 g 5   ??? lancets Misc 1 each by Miscellaneous route Three (3) times a day before meals. 300 each 1   ??? levothyroxine (SYNTHROID) 50 MCG tablet Take 1 tablet (50 mcg total) by mouth daily. 90 tablet 1   ??? lidocaine (XYLOCAINE) 5 % ointment lidocaine 5 % topical ointment     ??? miscellaneous medical supply Misc Gauze pads 4 x 4 inches and paper tape 30 each 0   ??? miscellaneous medical supply Misc Nebulizer- Use as directed with albuterol nebs for SOB 1 each 0   ??? [EXPIRED] MITIGARE 0.6 mg cap capsule Take 1.2 mg on Day 1 of flare, followed by 0.6 mg one hour later. Day 2 take 0.6 mg BID until flare  resolves. 60 capsule 3   ??? montelukast (SINGULAIR) 10 mg tablet Take 1 tablet (10 mg total) by mouth daily. 90 each 1   ??? nystatin (MYCOSTATIN) 100,000 unit/gram powder Apply to affected area 3 times daily 30 g 1   ??? oxybutynin (DITROPAN) 5 MG tablet Take 1 tablet (5 mg total) by mouth Two (2) times a day. 180 tablet 1   ??? pantoprazole (PROTONIX) 40 MG tablet Take 1 tablet (40 mg total) by mouth Two (2) times a day. 180 tablet 1   ??? polyethylene glycol (GLYCOLAX) 17 gram/dose powder take 17GM (DISSOLVED IN WATER) by mouth once daily     ??? PROAIR HFA 90 mcg/actuation inhaler Inhale 2 puffs every four (4) hours as needed for wheezing. 3 Inhaler 1   ??? propranoloL (INDERAL LA) 120 mg 24 hr capsule Take 1 capsule (120 mg total) by mouth daily. 90 capsule 1   ??? sour cherry extract (TART CHERRY EXTRACT) 1,000 mg cap      ??? topiramate (TOPAMAX) 50 MG tablet Take 1.5 tablets (75 mg total) by mouth Two (2) times a day. 270 tablet 1   ??? traZODone (DESYREL) 100 MG tablet Take 2 tablets (200 mg total) by mouth nightly. 180 tablet 0     No current facility-administered medications on file prior to visit.

## 2020-01-28 NOTE — Unmapped (Signed)
[facility] ([facility])                    RECRUITMENT SUMMARY NOTE       Patient was outreached to remind about upcoming CAMP appointment. Unable to contact- unable to leave message.      Video Visit Reminder Checklist:   COS has verified the following with the patient/patient representative:   [x]  Verify access to device with camera (smartphone/tablet or computer with webcam)   []  Verify patient/patient representative is able access MyChart (documented in appt notes)  [x]  Verify patient/patient representative is able access link by text/email (documented appt notes)  [x]  Verify e-mail address AND/OR cell phone number with patient  [x]  Verify name of each inhaler with patient (Advair, albuterol)  [x]  Remind patient to have inhalers on hand at time of appointment  Select the following:   [] MyChart    []   Computer: Confirm Chrome is Therapist, art (send patient visit instructions for PC, if needed)   []   Phone/tablet: confirm MyChart app is downloaded on mobile device (send patient visit instructions for mobile again, if needed)  [x]  Link  [x]  Phone/tablet: Confirm [employer] is Therapist, art and patient is able to receive texts and has camera on phone  []  Computer: Confirm Chrome is Therapist, art and Ensure patient is able to access e-mail and has a Sport and exercise psychologist (MyChart access):  []  Provide patient with [facility] contact information for additional support- [phone]       Jerolyn Center  Clinical Operations Specialist/CPhT  [place] [facility] ([facility])  (P) [phone] ([phone]

## 2020-01-30 MED ORDER — GLIPIZIDE 10 MG TABLET
ORAL_TABLET | Freq: Two times a day (BID) | ORAL | 1 refills | 90.00000 days
Start: 2020-01-30 — End: 2021-01-30

## 2020-01-31 NOTE — Unmapped (Signed)
[facility] ([facility]) [facility]-- COPD Best Practice Advisory (BPA) Alert Clinic      Name: Logan Quinn  Date of Birth: January 02, 1981  MRN: [id number]      Called pt for CAMP Clinic an initial appointment on 01/31/20 @ 1:45 PM after patient did not connect to video visit.  Pt is unable to complete the visit at this time. Rescheduled appointment for 8/26 per pt request.      Shanon Brow, PharmD, CPP  Clinical Pharmacist,  [facility] Medications Program (CAMP)  CAMP Clinic: [phone] - Fax: [phone]

## 2020-01-31 NOTE — Unmapped (Unsigned)
[facility] ([facility]) Clinic - COPD Best Practice Advisory (BPA) Alert Clinic    Ikey Omary, a 39 y.o. male who presents for {APJ Visit Type:35799} real-time audio and video visit with the CAMP Clinic as part of the COPD BPA Clinic for {CAMP BPA Clinic Visit Type:70140}.   PCP: [name], FNP      Recommendations to provider:  ?? ***  ???      ASSESSMENT & PLAN     COPD    Patient reported treatment: see table below in subjective & objective     Assessment / Plan  Goal of visit to reduce exacerbations and symptoms through inhaler education.  ?? Specific recommendations to provider noted above  ?? Educated patient on proper inhaler use technique and indication for *** by using {BPA Clinic Education Tools:70601}. Patient was able to verbalize understanding and all questions answered at this time. BPA alert addressed within chart   ?? Reviewed importance medication adherence with patient   ?? Determined if affordability of and access to medications is a concern for the patient. ***  ?? ***Assessed necessary information to complete MMRC questionnaire. BPA alert addressed    ?? ***Encouraged patient to complete spirometry as ordered 11/18/19  ?? ***Spirometry needed? Copy statement into recommendations section: Patient identified as having a need for spirometry through a BPA. As a reminder, this BPA triggers for COPD registry patients that do not have a documented spirometry completed within the last 5 years. Recommend completion of in office spirometry or referral to facility capable of completing spirometry to confirm COPD diagnosis/severity, track worsening of disease, and satisfy the BPA.     _____________________________________________________    I spent *** minutes on the {phone audio video visit:67489} with the patient on the date of service. I spent an additional *** minutes on pre- and post-visit activities on the date of service.     The patient was physically located in [place] or a state in which I am permitted to provide care. The patient and/or parent/guardian understood that s/he may incur co-pays and cost sharing, and agreed to the telemedicine visit. The visit was reasonable and appropriate under the circumstances given the patient's presentation at the time.    The patient and/or parent/guardian has been advised of the potential risks and limitations of this mode of treatment (including, but not limited to, the absence of in-person examination) and has agreed to be treated using telemedicine. The patient's/patient's family's questions regarding telemedicine have been answered.     If the visit was completed in an ambulatory setting, the patient and/or parent/guardian has also been advised to contact their provider???s office for worsening conditions, and seek emergency medical treatment and/or call 911 if the patient deems either necessary.      {campcomplete:63036}    Future scheduled follow up visits include the following. Notes and recommendations from today's visit {will/will not:32628} be {fax vs epic:39716} to [name], FNP  Future Appointments   Date Time Provider Department Center   02/03/2020 11:00 AM Lesia Sago [place], CPP [facility]   02/21/2020 10:00 AM [name] Rosita Fire, FNP [facility]   05/24/2020 10:40 AM [name] Rosita Fire, FNP [facility]       ***     Subjective   SUBJECTIVE & OBJECTIVE:      COPD Medications:    Drug Class Medication   Rescue Inhaler Albuterol  inhaler   Rescue Inhaler Albuterol  nebulizer  Maintenance Inhaler  (LABA) {CAMP COPD TOC LABAs:62126} {inhaler/nebulizer:61785}   Maintenance Inhaler  (LAMA) {CAMPTOC COPD LAMAs:62127} {inhaler/nebulizer:61785}   Maintenance Inhaler  (LABA+LAMA) {COPD LABA+LAMA Ms:64328} {inhaler/nebulizer:61785}   Maintenance Inhaler  (ICS Combo) Salmeterol + fluticasone (Advair Diskus, Advair HFA, AirDuo RespiClick)   inhaler     Affordability:  Patient states inhalers listed above {Actions; are/are not:16769} affordable to them at the time of this visit. ***    Adherence:   Patient {Blank single:19197::denies,reports,***} missed doses of ***. Reason(s) for nonadherence: {Adherence barriers _cbf:52877::There are no barriers to adherence identified at this time.}    MMRC  Most recent MMRC dyspnea scale: 3 Last MMRC date: 06/30/2017     HEALTH MAINTENANCE  Immunizations:  Immunization History   Administered Date(s) Administered   ??? Hepatitis B Vaccine, Unspecified Formulation 12/27/1999, 04/29/2000   ??? Hepatitis B, Adult 03/05/2013, 04/05/2013, 09/03/2013   ??? INFLUENZA INJ MDCK PF, Quad (Flucelvax)(2y and up Egg Free) 03/31/2019   ??? INFLUENZA TIV (TRI) PF (IM) 10/08/2011   ??? Influenza Vaccine Quad (IIV4 PF) 35mo+ injectable 03/05/2013, 03/18/2014, 03/22/2015, 03/05/2016, 03/19/2017, 02/23/2018   ??? Influenza Virus Vaccine, unspecified formulation 03/19/2017, 02/23/2018, 03/25/2019   ??? PNEUMOCOCCAL POLYSACCHARIDE 23 12/30/2012   ??? PPD Test 10/28/2016   ??? TdaP 07/18/2008, 07/16/2013   ??? Tetanus and diptheria,(adult), adsorbed, 2Lf tetanus toxoid, PF 04/29/2000       Smoking:  Social History     Tobacco Use   Smoking Status Never Smoker   Smokeless Tobacco Never Used        Current Outpatient Medications on File Prior to Visit   Medication Sig Dispense Refill   ??? acetaminophen (TYLENOL) 500 MG tablet Take 2 tablets (1,000 mg total) by mouth Three (3) times a day. 30 tablet 0   ??? ADVAIR DISKUS 500-50 mcg/dose diskus Inhale 1 puff 2 (two) times a day. 60 each 11   ??? albuterol 2.5 mg /3 mL (0.083 %) nebulizer solution Inhale 3 mL (2.5 mg total) by nebulization every four (4) hours as needed for wheezing. 180 mL 2   ??? albuterol HFA 90 mcg/actuation inhaler Inhale 2 puffs every six (6) hours as needed for wheezing. 3 Inhaler 1   ??? ALPRAZolam (XANAX) 0.5 MG tablet Take 1 tablet (0.5 mg total) by mouth Three (3) times a day. 90 tablet 1   ??? blood sugar diagnostic Strp by Other route Three (3) times a day before meals. 300 each 1   ??? blood-glucose meter kit Use as directed 1 each 0   ??? cephalexin (KEFLEX) 500 MG capsule Take 1 capsule (500 mg total) by mouth Four (4) times a day. 40 capsule 0   ??? cetirizine (ZYRTEC) 10 MG tablet Take 1 tablet (10 mg total) by mouth nightly as needed for allergies. 90 tablet 1   ??? chlorhexidine (PERIDEX) 0.12 % solution 15 mL by Mouth route Two (2) times a day. 437 mL 0   ??? clindamycin (CLEOCIN T) 1 % external solution Apply topically Two (2) times a day. 60 mL 4   ??? clobetasoL (TEMOVATE) 0.05 % ointment Apply daily to painful affected areas if needed for flares, then stop 15 g 1   ??? cyclobenzaprine (FLEXERIL) 10 MG tablet Take 1 tablet (10 mg total) by mouth Three (3) times a day as needed for muscle spasms. 60 tablet 0   ??? dextroamphetamine-amphetamine (ADDERALL) 20 mg tablet Take 1 tablet by mouth Two (2) times a day. 30 tablet 0   ??? dicyclomine (BENTYL) 10  mg capsule Take 1 capsule (10 mg total) by mouth Three (3) times a day. 270 capsule 1   ??? diphenoxylate-atropine (LOMOTIL) 2.5-0.025 mg per tablet Take 1 tablet by mouth two (2) times a day as needed for diarrhea. 30 tablet 5   ??? DULoxetine (CYMBALTA) 60 MG capsule Take 1 capsule (60 mg total) by mouth Two (2) times a day. 180 capsule 0   ??? empty container (SHARPS CONTAINER) Misc Use as directed to dispose of Humira needles 1 each 2   ??? empty container Misc Use as directed to dispose of Humira injections 1 each 2   ??? erenumab-aooe 140 mg/mL AtIn Inject 140 mg under the skin every twenty-eight (28) days. 1 mL 6   ??? [EXPIRED] ergocalciferol (DRISDOL) 1,250 mcg (50,000 unit) capsule Take 1 capsule (50,000 Units total) by mouth once a week. 4 capsule 2   ??? famotidine (PEPCID) 20 MG tablet Take 20 mg by mouth Two (2) times a day.     ??? fluticasone propionate (FLONASE) 50 mcg/actuation nasal spray 1 spray into each nostril Two (2) times a day. 16 g 5   ??? gabapentin (NEURONTIN) 300 MG capsule Take 300 mg by mouth Three (3) times a day.     ??? glipiZIDE (GLUCOTROL) 10 MG tablet Take 2 tablets (20 mg total) by mouth Two (2) times a day (30 minutes before a meal). 360 tablet 1   ??? HUMIRA PEN CITRATE FREE 40 MG/0.4 ML Inject the contents of 2 pens (80mg  total) under the skin every seven (7) days. 8 each 3   ??? hydrocortisone (PROCTOSOL HC) 2.5 % rectal cream Insert 1 application into the rectum two (2) times a day as needed. 28.35 g 2   ??? hydrOXYzine (ATARAX) 25 MG tablet TAKE 1/2 TO 4 TABLETS BY MOUTH UP TO THREE TIMES DAILY AS NEEDED FOR ANXIETY     ??? ibuprofen (ADVIL,MOTRIN) 800 MG tablet Take 1 tablet (800 mg total) by mouth every eight (8) hours as needed. 90 tablet 1   ??? insulin glargine (LANTUS U-100 INSULIN) 100 unit/mL injection Inject 0.9 mL (90 Units total) under the skin nightly. 90 mL 3   ??? insulin pump cartridge Crtg Inject 120 Units under the skin daily. 5 each 5   ??? insulin regular (HUMULIN R REGULAR U-100 INSULN) 100 unit/mL injection SLIDING SCALE BLOOD SUGAR 0-150=0 UNITS,150-200=3 UNITS, 200-250=6 UNITS,250-300=9 UNITS, 300-350=12 UNITS 40 mL 3   ??? insulin syringe-needle U-100 (BD INSULIN SYRINGE ULTRA-FINE) 1 mL 31 gauge x 5/16 (8 mm) Syrg Use up to 5 x daily to administer insulin. 100 each 1   ??? ketoconazole (NIZORAL) 2 % cream Apply 1 application topically daily. 15 g 5   ??? lancets Misc 1 each by Miscellaneous route Three (3) times a day before meals. 300 each 1   ??? levothyroxine (SYNTHROID) 50 MCG tablet Take 1 tablet (50 mcg total) by mouth daily. 90 tablet 1   ??? lidocaine (XYLOCAINE) 5 % ointment lidocaine 5 % topical ointment     ??? miscellaneous medical supply Misc Gauze pads 4 x 4 inches and paper tape 30 each 0   ??? miscellaneous medical supply Misc Nebulizer- Use as directed with albuterol nebs for SOB 1 each 0   ??? [EXPIRED] MITIGARE 0.6 mg cap capsule Take 1.2 mg on Day 1 of flare, followed by 0.6 mg one hour later. Day 2 take 0.6 mg BID until flare resolves. 60 capsule 3   ??? montelukast (SINGULAIR) 10 mg tablet Take  1 tablet (10 mg total) by mouth daily. 90 each 1   ??? nystatin (MYCOSTATIN) 100,000 unit/gram powder Apply to affected area 3 times daily 30 g 1   ??? oxybutynin (DITROPAN) 5 MG tablet Take 1 tablet (5 mg total) by mouth Two (2) times a day. 180 tablet 1   ??? pantoprazole (PROTONIX) 40 MG tablet Take 1 tablet (40 mg total) by mouth Two (2) times a day. 180 tablet 1   ??? polyethylene glycol (GLYCOLAX) 17 gram/dose powder take 17GM (DISSOLVED IN WATER) by mouth once daily     ??? PROAIR HFA 90 mcg/actuation inhaler Inhale 2 puffs every four (4) hours as needed for wheezing. 3 Inhaler 1   ??? propranoloL (INDERAL LA) 120 mg 24 hr capsule Take 1 capsule (120 mg total) by mouth daily. 90 capsule 1   ??? sour cherry extract (TART CHERRY EXTRACT) 1,000 mg cap      ??? topiramate (TOPAMAX) 50 MG tablet Take 1.5 tablets (75 mg total) by mouth Two (2) times a day. 270 tablet 1   ??? traZODone (DESYREL) 100 MG tablet Take 2 tablets (200 mg total) by mouth nightly. 180 tablet 0     No current facility-administered medications on file prior to visit.

## 2020-02-02 NOTE — Unmapped (Signed)
[facility] ([facility])                    RECRUITMENT SUMMARY NOTE       Patient was outreached to remind about upcoming CAMP appointment. Unable to contact- unable to leave message.    Benna Dunks [place]  Clinical Operations Specialist/CPhT  [employer] [facility] ([facility])  (P) [phone]

## 2020-02-03 ENCOUNTER — Ambulatory Visit: Admit: 2020-02-03

## 2020-02-03 NOTE — Unmapped (Signed)
[facility] ([facility]) [facility]-- COPD Best Practice Advisory (BPA) Alert Clinic      Name: Logan Quinn  Date of Birth: 29-Sep-1980  MRN: [id number]      We have been unsuccessful in contacting this patient multiple times. As a result, the patient is no longer active in the CAMP Clinic. Should you wish the patient to re-engage, please discuss with the patient and refer them back to [place].      Shanon Brow, PharmD, CPP  Clinical Pharmacist, [facility] ([facility])  C[facility] Clinic: [phone] - Fax: [phone]

## 2020-02-07 ENCOUNTER — Ambulatory Visit (INDEPENDENT_AMBULATORY_CARE_PROVIDER_SITE_OTHER): Payer: Medicare Other | Admitting: Podiatry

## 2020-02-07 ENCOUNTER — Ambulatory Visit (INDEPENDENT_AMBULATORY_CARE_PROVIDER_SITE_OTHER): Payer: Medicare Other

## 2020-02-07 ENCOUNTER — Other Ambulatory Visit: Payer: Self-pay | Admitting: Podiatry

## 2020-02-07 ENCOUNTER — Other Ambulatory Visit: Payer: Self-pay

## 2020-02-07 ENCOUNTER — Encounter: Payer: Self-pay | Admitting: Podiatry

## 2020-02-07 DIAGNOSIS — S92354A Nondisplaced fracture of fifth metatarsal bone, right foot, initial encounter for closed fracture: Secondary | ICD-10-CM

## 2020-02-07 DIAGNOSIS — S99921A Unspecified injury of right foot, initial encounter: Secondary | ICD-10-CM

## 2020-02-07 MED ORDER — TRAMADOL HCL 50 MG PO TABS: 50.0000 mg | ORAL_TABLET | Freq: Three times a day (TID) | ORAL | 0 refills | Status: AC | PRN

## 2020-02-07 NOTE — Progress Notes (Signed)
Subjective:  Patient ID: Jason Davidson, male    DOB: 07/02/1980,  MRN: [id number] HPI Chief Complaint  Patient presents with  . Foot Pain    Patient presents today for right foot injury.  He states " was getting into my truck last week, and the door started to close and hit my right foot"  He now c/o pain with walking and tenderness to lateral side of right foot.  He has been using ice which helps, elevating foot, Tylenol and Ibuprofen    39 y.o. male presents with the above complaint.   ROS: Denies fever chills nausea vomiting muscle aches pains calf pain back pain chest pain shortness of breath.  Past Medical History:  Diagnosis Date  . Asthma   . Depressed   . Diabetes mellitus without complication ([place])   . IBS (irritable bowel syndrome)   . Morbid obesity ([place])    Past Surgical History:  Procedure Laterality Date  . EYE SURGERY      Current Outpatient Medications:  .  Adalimumab (HUMIRA PEN) 40 MG/0.4ML PNKT, Inject into the skin., Disp: , Rfl:  .  AIMOVIG 70 MG/ML SOAJ, , Disp: , Rfl: 0 .  ALPRAZolam (XANAX) 0.5 MG tablet, Take 0.5 mg by mouth at bedtime as needed for anxiety., Disp: , Rfl:  .  amphetamine-dextroamphetamine (ADDERALL) 10 MG tablet, Take 10 mg by mouth 2 (two) times daily with a meal., Disp: , Rfl:  .  azelastine (ASTELIN) 0.1 % nasal spray, Place into the nose., Disp: , Rfl:  .  Colchicine (MITIGARE) 0.6 MG CAPS, Take 1.2 mg on Day 1 of flare, followed by 0.6 mg one hour later. Day 2 take 0.6 mg BID until flare resolves., Disp: , Rfl:  .  cyclobenzaprine (FLEXERIL) 10 MG tablet, Take by mouth., Disp: , Rfl:  .  cyclobenzaprine (FLEXERIL) 10 MG tablet, Take 1 tablet (10 mg total) by mouth 3 (three) times daily as needed., Disp: 15 tablet, Rfl: 0 .  dicyclomine (BENTYL) 10 MG capsule, Take 10 mg by mouth 3 (three) times daily before meals., Disp: , Rfl:  .  diphenoxylate-atropine (LOMOTIL) 2.5-0.025 MG tablet, Take by mouth., Disp: , Rfl:  .   DULoxetine (CYMBALTA) 60 MG capsule, Take 120 mg by mouth daily. , Disp: , Rfl:  .  famotidine (PEPCID) 20 MG tablet, Take by mouth., Disp: , Rfl:  .  Fluticasone-Salmeterol (ADVAIR) 500-50 MCG/DOSE AEPB, Inhale 1 puff into the lungs 2 (two) times daily., Disp: , Rfl:  .  gabapentin (NEURONTIN) 100 MG capsule, Take 100 mg by mouth 3 (three) times daily., Disp: , Rfl:  .  gabapentin (NEURONTIN) 300 MG capsule, Take 300 mg by mouth 3 (three) times daily., Disp: , Rfl:  .  glipiZIDE (GLUCOTROL) 10 MG tablet, Take 10 mg by mouth daily before breakfast., Disp: , Rfl:  .  glucose blood (PRECISION QID TEST) test strip, Use 1 strip 3 times a day with meter, Disp: , Rfl:  .  hydrocortisone (ANUSOL-HC) 2.5 % rectal cream, Place rectally., Disp: , Rfl:  .  hydrocortisone (ANUSOL-HC) 25 MG suppository, Place rectally., Disp: , Rfl:  .  insulin glargine (LANTUS) 100 UNIT/ML injection, Inject into the skin., Disp: , Rfl:  .  insulin regular (NOVOLIN R,HUMULIN R) 250 units/2.37m (100 units/mL) injection, Inject into the skin 3 (three) times daily before meals., Disp: , Rfl:  .  Insulin Syringe-Needle U-100 (BD INSULIN SYRINGE U/F) 31G X 5/16" 1 ML MISC, Use as directed with the Lantus  Insulin nightly, Disp: , Rfl:  .  levothyroxine (SYNTHROID) 50 MCG tablet, Take by mouth., Disp: , Rfl:  .  loperamide (IMODIUM) 2 MG capsule, Take by mouth., Disp: , Rfl:  .  montelukast (SINGULAIR) 10 MG tablet, Take by mouth., Disp: , Rfl:  .  pantoprazole (PROTONIX) 40 MG tablet, Take 40 mg by mouth daily., Disp: , Rfl:  .  polyethylene glycol powder (GLYCOLAX/MIRALAX) powder, take 17GM (DISSOLVED IN WATER) by mouth once daily, Disp: , Rfl: 0 .  PROAIR HFA 108 (90 Base) MCG/ACT inhaler, , Disp: , Rfl: 0 .  promethazine (PHENERGAN) 25 MG tablet, Take by mouth., Disp: , Rfl:  .  propranolol (INNOPRAN XL) 120 MG 24 hr capsule, Take 120 mg by mouth at bedtime., Disp: , Rfl:  .  Spacer/Aero-Holding Chambers (AEROCHAMBER PLUS)  inhaler, Use as instructed, Disp: 1 each, Rfl: 2 .  topiramate (TOPAMAX) 50 MG tablet, Take 50 mg by mouth 2 (two) times daily., Disp: , Rfl:  .  traMADol (ULTRAM) 50 MG tablet, Take 1 tablet (50 mg total) by mouth every 8 (eight) hours as needed for up to 5 days., Disp: 15 tablet, Rfl: 0 .  traZODone (DESYREL) 100 MG tablet, Take by mouth., Disp: , Rfl:   Allergies  Allergen Reactions  . Erythromycin Nausea And Vomiting  . Penicillins Swelling  . Sulfa Antibiotics Nausea And Vomiting   Review of Systems Objective:  There were no vitals filed for this visit.  General: Well developed, nourished, in no acute distress, alert and oriented x3   Dermatological: Skin is warm, dry and supple bilateral. Nails x 10 are well maintained; remaining integument appears unremarkable at this time. There are no open sores, no preulcerative lesions, no rash or signs of infection present.  Vascular: Dorsalis Pedis artery and Posterior Tibial artery pedal pulses are 2/4 bilateral with immedate capillary fill time. Pedal hair growth present. No varicosities and no lower extremity edema present bilateral.   Neruologic: Grossly intact via light touch bilateral. Vibratory intact via tuning fork bilateral. Protective threshold with Semmes Wienstein monofilament intact to all pedal sites bilateral. Patellar and Achilles deep tendon reflexes 2+ bilateral. No Babinski or clonus noted bilateral.   Musculoskeletal: No gross boney pedal deformities bilateral. No pain, crepitus, or limitation noted with foot and ankle range of motion bilateral. Muscular strength 5/5 in all groups tested bilateral.  Pain and swelling overlying the dorsal lateral aspect of the fifth metatarsal of the right foot.  There is no ecchymosis noted.  Exquisite pain on palpation of the fifth metatarsal base.  Gait: Unassisted, Nonantalgic.    Radiographs:  Radiographs taken today demonstrate an osseously mature individual considerable  subcutaneous fat noted.  Fifth metatarsal base demonstrates vertical fracture on lateral view transverse fracture on AP view nondisplaced noncomminuted appears to be hairline.  Assessment & Plan:   Assessment: Fifth met base fracture right foot.  Nondisplaced.  Plan: Discussed etiology pathology conservative versus surgical therapies.  At this point I went ahead and placed him in a cam walker he noted immediate relief and I will follow-up with him in 4 weeks for another set of x-rays.     Savannaha Stonerock [place]. [place], [place]

## 2020-02-17 NOTE — Unmapped (Signed)
[facility] Refill Coordination Note    Specialty Medication(s) to be Shipped:   Inflammatory Disorders: Humira    Other medication(s) to be shipped: No additional medications requested for fill at this time     Logan Quinn, DOB: [birth year]  Phone: [phone] (home)       All above HIPAA information was verified with patient.     Was a Nurse, learning disability used for this call? No    Completed refill call assessment today to schedule patient's medication shipment from the [facility] [phone]).       Specialty medication(s) and dose(s) confirmed: Regimen is correct and unchanged.   Changes to medications: Logan Quinn reports no changes at this time.  Changes to insurance: No  Questions for the pharmacist: No    Confirmed patient received Welcome Packet with first shipment. The patient will receive a drug information handout for each medication shipped and additional FDA Medication Guides as required.       DISEASE/MEDICATION-SPECIFIC INFORMATION        For patients on injectable medications: Patient currently has 1 doses left.  Next injection is scheduled for 02/17/2020.    SPECIALTY MEDICATION ADHERENCE     Medication Adherence    Patient reported X missed doses in the last month: 0  Specialty Medication: Humira CF 40 mg/0.4 ml  Patient is on additional specialty medications: No  Any gaps in refill history greater than 2 weeks in the last 3 months: no  Demonstrates understanding of importance of adherence: yes  Informant: patient  Reliability of informant: reliable  Confirmed plan for next specialty medication refill: delivery by pharmacy  Refills needed for supportive medications: not needed                    SHIPPING     Shipping address confirmed in Epic.     Delivery Scheduled: Yes, Expected medication delivery date: 02/21/2020.     Medication will be delivered via Same Day Courier to the prescription address in Epic WAM.    Mayreli Alden D Beverley Allender   [facility] Specialty Technician

## 2020-02-21 ENCOUNTER — Ambulatory Visit: Admit: 2020-02-21 | Discharge: 2020-02-22 | Payer: MEDICAID | Attending: Family | Primary: Family

## 2020-02-21 DIAGNOSIS — J4520 Mild intermittent asthma, uncomplicated: Principal | ICD-10-CM

## 2020-02-21 DIAGNOSIS — E1165 Type 2 diabetes mellitus with hyperglycemia: Principal | ICD-10-CM

## 2020-02-21 DIAGNOSIS — G4733 Obstructive sleep apnea (adult) (pediatric): Principal | ICD-10-CM

## 2020-02-21 DIAGNOSIS — Z794 Long term (current) use of insulin: Principal | ICD-10-CM

## 2020-02-21 DIAGNOSIS — I10 Essential (primary) hypertension: Principal | ICD-10-CM

## 2020-02-21 DIAGNOSIS — Z6841 Body Mass Index (BMI) 40.0 and over, adult: Secondary | ICD-10-CM

## 2020-02-21 MED FILL — HUMIRA PEN CITRATE FREE 40 MG/0.4 ML: 28 days supply | Qty: 8 | Fill #3 | Status: AC

## 2020-02-21 MED FILL — HUMIRA PEN CITRATE FREE 40 MG/0.4 ML: 28 days supply | Qty: 8 | Fill #3

## 2020-02-21 NOTE — Unmapped (Unsigned)
Assessment and Plan:     Logan Quinn was seen today for annual exam.    Diagnoses and all orders for this visit:    Type 2 diabetes mellitus with hyperglycemia, with long-term current use of insulin (CMS-HCC)  HGB A1c 9.4 (up from 8.7 three months ago).   DM uncontrolled. Continue glipizide 20 mg BID, use of OmniPod.  Reinforced carb controlled diet and encouraged regular exercise.   -     POCT glycosylated hemoglobin (Hb A1C)    Essential hypertension  BP borderline elevated (140/70 in clinic today). Suspect missed dose of propranolol last evening is contributory.   Continue propranolol 120 mg daily.   Reviewed low sodium diet and encouraged regular exercise. Advised to monitor and log at-home BP readings.    Morbid obesity with BMI of 70 and over, adult (CMS-HCC)  Counseled on healthy dietary choices, portion control and regular exercise.     Obstructive sleep apnea  Patient complaint with CPAP. Continue nightly use.     Mild intermittent asthma without complication  Advised Advair daily, as prescribed. Continue rescue inhaler, albuterol, prn.   Patient to reschedule PFTs.    I personally spent 35 minutes face-to-face and non-face-to-face in the care of this patient, which includes all pre, intra, and post visit time on the date of service.  Over 50% spent in face to face counseling and education regarding DM management, healthy dietary choices, routine exercise, HM recommendations, and coordination of care.    Return in about 3 months (around 05/22/2020) for Next scheduled follow up.    HPI:      Logan Quinn  is here for   Chief Complaint   Patient presents with   ??? Annual Exam     Diabetes: Diabetes has customarily not been at goal (complicated by: obesity, lack of exercise, poor dietary choices).  Current symptoms include: none. Symptoms have been well-controlled. Patient denies foot ulcerations, hyperglycemia, hypoglycemia , increased appetite, nausea, paresthesia of the feet, polydipsia, polyuria, visual disturbances, vomiting and weight loss. Evaluation to date has included: hemoglobin A1C.  Home sugars: 100s this AM, 232 during visit today per Dexcom. Current treatment: Continued glipizide and Omnipod which has been not very effective. He does report 7-10 day period of time where he did not take glipizide due to misplacing medication.  He expresses mixed feelings regarding OmniPod. He feels he may have had more control over his BGs when he was self-administering Humalog and Lantus. He intends to continue OmniPod for few more weeks. Doing regular exercise: no. He has tried few water aerobics classes.    Hypertension: Patient presents for follow-up of hypertension. Blood pressure goal < 140/90.  Hypertension has customarily been at goal complicated by obesity, lack of exercise, poor dietary choices. Home blood pressure readings: did not bring log. Salt intake and diet: poor dietary choices. Associated signs and symptoms: none. Patient denies: blurred vision, chest pain, dyspnea, headache, neck aches, orthopnea, palpitations, paroxysmal nocturnal dyspnea, peripheral edema, pulsating in the ears and tiredness/fatigue. Current treatment: propranolol. Patient admits he forgot to take medication last evening. He is not doing regular exercise.      OSA: Patient has remained compliant with nightly use of CPAP machine, wearing for at least 4 hours every night.     HS: Patient reports new lesions to groin, spreading to scrotum. He has not discussed new lesions with dermatology. Last follow up with dermatology was in 04/2019. Current treatment: Humira and Keflex.     Patient recently  injured his right foot. He reports injury occurred when he kicked his truck door as it was closing. He was evaluated by podiatry 02/07/20, where XR revealed fifth met base fracture of right foot. He was placed in boot. Next follow up 03/15/20.     Asthma: Patient presents for follow-up of asthma. Current symptoms: dyspnea on exertion. Current medications: Rescue inhaler albuterol. He is prescribed Advair, but reports rare use. He is not seeing Pulmonology.   He was scheduled for PFTs in 11/2019, but states he had to cancel appt. He vocalizes intent to reschedule appt.         ROS:      Comprehensive 10 point ROS negative unless otherwise stated in the HPI.      [facility] Components:     Medication adherence and barriers to the treatment plan have been addressed. Opportunities to optimize healthy behaviors have been discussed. Patient / caregiver voiced understanding.    Past Medical/Surgical History:     Past Medical History:   Diagnosis Date   ??? Acne    ??? Allergic    ??? Anxiety    ??? Depression    ??? Diabetes mellitus (CMS-HCC)    ??? GERD (gastroesophageal reflux disease)    ??? Gout    ??? Hypertension    ??? IBS (irritable bowel syndrome)    ??? 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 Liver disease    ??? Migraines    ??? Morbid obesity with BMI of 60.0-69.9, adult (CMS-HCC)    ??? 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 Obstructive sleep apnea    ??? OSA on CPAP    ??? Severe obstructive sleep apnea    ??? Trapezius muscle strain 12/07/2013   ??? Urinary incontinence, nocturnal enuresis    ??? Venous insufficiency      Past Surgical History:   Procedure Laterality Date   ??? EYE SURGERY  11/15   ??? PR COLONOSCOPY FLX DX W/COLLJ SPEC WHEN PFRMD N/A 03/24/2013    Procedure: COLONOSCOPY, FLEXIBLE, PROXIMAL TO SPLENIC FLEXURE; DIAGNOSTIC, W/WO COLLECTION SPECIMEN BY BRUSH OR WASH;  Surgeon: Clint Bolder, MD;  [facility];  Service: Gastroenterology   ??? PR EYE SURG POST SGMT PROC UNLISTED Left     pneumatic retinopexy OS   ??? PR UPPER GI ENDOSCOPY,DIAGNOSIS N/A 02/02/2013    Procedure: UGI ENDO, INCLUDE ESOPHAGUS, STOMACH, & DUODENUM &/OR JEJUNUM; DX W/WO COLLECTION SPECIMN, BY BRUSH OR WASH;  Surgeon: Malcolm Metro, MD;  Location: [facility];  Service: Gastroenterology   ??? [place] PYLORIC STENOSIS ([place] HISTORICAL RESULT)         Family History: Family History   Problem Relation Age of Onset   ??? Cancer Maternal Grandfather    ??? Hearing loss Maternal Grandfather    ??? Cancer Paternal Grandfather    ??? COPD Paternal Grandmother    ??? Arthritis Paternal Grandmother    ??? Depression Paternal Grandmother    ??? Diabetes Mother    ??? Heart disease Mother    ??? Migraines Mother    ??? Arthritis Mother    ??? Depression Mother    ??? GER disease Mother    ??? Hypertension Mother    ??? Angina Mother    ??? COPD Mother    ??? Glaucoma Mother    ??? Hearing loss Mother    ??? Diabetes Sister    ??? Migraines Sister    ??? Asthma Sister    ???  Depression Sister    ??? Hearing loss Sister    ??? Diabetes Brother    ??? Asthma Brother    ??? Diabetes Maternal Grandmother    ??? Heart disease Maternal Grandmother    ??? Migraines Maternal Grandmother    ??? Depression Maternal Grandmother    ??? Angina Maternal Grandmother    ??? Hypertension Maternal Grandmother    ??? Stroke Maternal Grandmother    ??? Diabetes Maternal Uncle    ??? Hearing loss Maternal Uncle    ??? Diabetes Maternal Uncle    ??? Liver disease Maternal Uncle    ??? Kidney disease Maternal Uncle    ??? Asthma Brother    ??? Colorectal Cancer Neg Hx    ??? Esophageal cancer Neg Hx    ??? Liver cancer Neg Hx    ??? Pancreatic cancer Neg Hx    ??? Stomach cancer Neg Hx    ??? Amblyopia Neg Hx    ??? Blindness Neg Hx    ??? Retinal detachment Neg Hx    ??? Strabismus Neg Hx    ??? Macular degeneration Neg Hx    ??? Anesthesia problems Neg Hx    ??? Broken bones Neg Hx    ??? Clotting disorder Neg Hx    ??? Collagen disease Neg Hx    ??? Dislocations Neg Hx    ??? Fibromyalgia Neg Hx    ??? Gout Neg Hx    ??? Hemophilia Neg Hx    ??? Osteoporosis Neg Hx    ??? Rheumatologic disease Neg Hx    ??? Scoliosis Neg Hx    ??? Severe sprains Neg Hx    ??? Sickle cell anemia Neg Hx    ??? Spinal Compression Fracture Neg Hx    ??? Melanoma Neg Hx    ??? Basal cell carcinoma Neg Hx    ??? Squamous cell carcinoma Neg Hx    ??? Deep vein thrombosis Neg Hx        Social History:     Social History     Tobacco Use   ??? Smoking status: Never Smoker   ??? Smokeless tobacco: Never Used   Vaping Use   ??? Vaping Use: Never used   Substance Use Topics   ??? Alcohol use: Never     Alcohol/week: 0.0 standard drinks     Comment: rare social   ??? Drug use: Never       Allergies:     Erythromycin, Penicillins, Sulfa (sulfonamide antibiotics), Other, Erythromycin base, and Sulfasalazine    Current Medications:     Current Outpatient Medications   Medication Sig Dispense Refill   ??? acetaminophen (TYLENOL) 500 MG tablet Take 2 tablets (1,000 mg total) by mouth Three (3) times a day. 30 tablet 0   ??? ADVAIR DISKUS 500-50 mcg/dose diskus Inhale 1 puff 2 (two) times a day. 60 each 11   ??? albuterol 2.5 mg /3 mL (0.083 %) nebulizer solution Inhale 3 mL (2.5 mg total) by nebulization every four (4) hours as needed for wheezing. 180 mL 2   ??? albuterol HFA 90 mcg/actuation inhaler Inhale 2 puffs every six (6) hours as needed for wheezing. 3 Inhaler 1   ??? ALPRAZolam (XANAX) 0.5 MG tablet Take 1 tablet (0.5 mg total) by mouth Three (3) times a day. 90 tablet 1   ??? blood sugar diagnostic Strp by Other route Three (3) times a day before meals. 300 each 1   ??? blood-glucose meter kit Use as directed  1 each 0   ??? cephalexin (KEFLEX) 500 MG capsule Take 1 capsule (500 mg total) by mouth Four (4) times a day. 40 capsule 0   ??? cetirizine (ZYRTEC) 10 MG tablet Take 1 tablet (10 mg total) by mouth nightly as needed for allergies. 90 tablet 1   ??? chlorhexidine (PERIDEX) 0.12 % solution 15 mL by Mouth route Two (2) times a day. 437 mL 0   ??? clindamycin (CLEOCIN T) 1 % external solution Apply topically Two (2) times a day. 60 mL 4   ??? clobetasoL (TEMOVATE) 0.05 % ointment Apply daily to painful affected areas if needed for flares, then stop 15 g 1   ??? cyclobenzaprine (FLEXERIL) 10 MG tablet Take 1 tablet (10 mg total) by mouth Three (3) times a day as needed for muscle spasms. 60 tablet 0   ??? dextroamphetamine-amphetamine (ADDERALL) 20 mg tablet Take 1 tablet by mouth Two (2) times a day. 30 tablet 0   ??? dicyclomine (BENTYL) 10 mg capsule Take 1 capsule (10 mg total) by mouth Three (3) times a day. 270 capsule 1   ??? diphenoxylate-atropine (LOMOTIL) 2.5-0.025 mg per tablet Take 1 tablet by mouth two (2) times a day as needed for diarrhea. 30 tablet 5   ??? DULoxetine (CYMBALTA) 60 MG capsule Take 1 capsule (60 mg total) by mouth Two (2) times a day. 180 capsule 0   ??? empty container (SHARPS CONTAINER) Misc Use as directed to dispose of Humira needles 1 each 2   ??? erenumab-aooe 140 mg/mL AtIn Inject 140 mg under the skin every twenty-eight (28) days. 1 mL 6   ??? famotidine (PEPCID) 20 MG tablet Take 20 mg by mouth Two (2) times a day.     ??? fluticasone propionate (FLONASE) 50 mcg/actuation nasal spray 1 spray into each nostril Two (2) times a day. 16 g 5   ??? gabapentin (NEURONTIN) 300 MG capsule Take 300 mg by mouth Three (3) times a day.     ??? glipiZIDE (GLUCOTROL) 10 MG tablet Take 2 tablets (20 mg total) by mouth Two (2) times a day (30 minutes before a meal). 360 tablet 1   ??? HUMIRA PEN CITRATE FREE 40 MG/0.4 ML Inject the contents of 2 pens (80mg  total) under the skin every seven (7) days. 8 each 3   ??? hydrocortisone (PROCTOSOL HC) 2.5 % rectal cream Insert 1 application into the rectum two (2) times a day as needed. 28.35 g 2   ??? hydrOXYzine (ATARAX) 25 MG tablet TAKE 1/2 TO 4 TABLETS BY MOUTH UP TO THREE TIMES DAILY AS NEEDED FOR ANXIETY     ??? ibuprofen (ADVIL,MOTRIN) 800 MG tablet Take 1 tablet (800 mg total) by mouth every eight (8) hours as needed. 90 tablet 1   ??? insulin pump cartridge Crtg Inject 120 Units under the skin daily. 5 each 5   ??? ketoconazole (NIZORAL) 2 % cream Apply 1 application topically daily. 15 g 5   ??? lancets Misc 1 each by Miscellaneous route Three (3) times a day before meals. 300 each 1   ??? levothyroxine (SYNTHROID) 50 MCG tablet Take 1 tablet (50 mcg total) by mouth daily. 90 tablet 1   ??? lidocaine (XYLOCAINE) 5 % ointment lidocaine 5 % topical ointment     ??? miscellaneous medical supply Misc Gauze pads 4 x 4 inches and paper tape 30 each 0   ??? montelukast (SINGULAIR) 10 mg tablet Take 1 tablet (10 mg total) by mouth daily. 90  each 1   ??? nystatin (MYCOSTATIN) 100,000 unit/gram powder Apply to affected area 3 times daily 30 g 1   ??? oxybutynin (DITROPAN) 5 MG tablet Take 1 tablet (5 mg total) by mouth Two (2) times a day. 180 tablet 1   ??? pantoprazole (PROTONIX) 40 MG tablet Take 1 tablet (40 mg total) by mouth Two (2) times a day. 180 tablet 1   ??? empty container Misc Use as directed to dispose of Humira injections 1 each 2   ??? insulin syringe-needle U-100 (BD INSULIN SYRINGE ULTRA-FINE) 1 mL 31 gauge x 5/16 (8 mm) Syrg Use up to 5 x daily to administer insulin. 100 each 1   ??? miscellaneous medical supply Misc Nebulizer- Use as directed with albuterol nebs for SOB 1 each 0   ??? polyethylene glycol (GLYCOLAX) 17 gram/dose powder take 17GM (DISSOLVED IN WATER) by mouth once daily     ??? PROAIR HFA 90 mcg/actuation inhaler Inhale 2 puffs every four (4) hours as needed for wheezing. 3 Inhaler 1   ??? propranoloL (INDERAL LA) 120 mg 24 hr capsule Take 1 capsule (120 mg total) by mouth daily. 90 capsule 1   ??? sour cherry extract (TART CHERRY EXTRACT) 1,000 mg cap      ??? topiramate (TOPAMAX) 50 MG tablet Take 1.5 tablets (75 mg total) by mouth Two (2) times a day. 270 tablet 1   ??? traZODone (DESYREL) 100 MG tablet Take 2 tablets (200 mg total) by mouth nightly. 180 tablet 0     No current facility-administered medications for this visit.       Health Maintenance:     Health Maintenance   Topic Date Due   ??? COPD Spirometry  Never done   ??? COVID-19 Vaccine (1) Never done   ??? Influenza Vaccine (1) 02/09/2020   ??? Hemoglobin A1c  02/18/2020   ??? Potassium Monitoring  04/24/2020   ??? Retinal Eye Exam  06/09/2020   ??? Urine Albumin/Creatinine Ratio  09/16/2020   ??? Serum Creatinine Monitoring  09/16/2020   ??? Foot Exam  11/17/2020   ??? DTaP/Tdap/Td Vaccines (4 - Td or Tdap) 07/17/2023   ??? Hepatitis C Screen  Completed   ??? Pneumococcal Vaccine  Completed       Immunizations:     Immunization History   Administered Date(s) Administered   ??? Hepatitis B Vaccine, Unspecified Formulation 12/27/1999, 04/29/2000   ??? Hepatitis B, Adult 03/05/2013, 04/05/2013, 09/03/2013   ??? INFLUENZA INJ MDCK PF, Quad (Flucelvax)(2y and up Egg Free) 03/31/2019   ??? INFLUENZA TIV (TRI) PF (IM) 10/08/2011   ??? Influenza Vaccine Quad (IIV4 PF) 24mo+ injectable 03/05/2013, 03/18/2014, 03/22/2015, 03/05/2016, 03/19/2017, 02/23/2018   ??? Influenza Virus Vaccine, unspecified formulation 03/19/2017, 02/23/2018, 03/25/2019   ??? PNEUMOCOCCAL POLYSACCHARIDE 23 12/30/2012   ??? PPD Test 10/28/2016   ??? TdaP 07/18/2008, 07/16/2013   ??? Tetanus and diptheria,(adult), adsorbed, 2Lf tetanus toxoid, PF 04/29/2000     I have reviewed and (if needed) updated the patient's problem list, medications, allergies, past medical and surgical history, social and family history.     Vital Signs:     Wt Readings from Last 3 Encounters:   02/21/20 (!) 232.2 kg (512 lb)   11/18/19 (!) 233.1 kg (514 lb)   09/29/19 (!) 228.2 kg (503 lb)     Temp Readings from Last 3 Encounters:   02/21/20 36.7 ??C (98.1 ??F) (Oral)   11/18/19 36.6 ??C (97.9 ??F) (Oral)   09/29/19 36.6 ??C (97.8 ??F) (  Oral)     BP Readings from Last 3 Encounters:   02/21/20 140/70   11/18/19 128/68   09/29/19 124/66     Pulse Readings from Last 3 Encounters:   02/21/20 74   11/18/19 55   09/29/19 61     Estimated body mass index is 71.44 kg/m?? as calculated from the following:    Height as of 11/18/19: 180.3 cm (5' 10.98).    Weight as of this encounter: 232.2 kg (512 lb).  Facility age limit for growth percentiles is 20 years.      Objective:      General: Alert and oriented x3. Well-appearing. No acute distress.   HEENT:  Normocephalic.  Atraumatic. Conjunctiva and sclera normal. OP MMM without lesions.   Neck:  Supple. No thyroid enlargement. No adenopathy.   Heart:  Regular rate and rhythm . Normal S1, S2.  No murmurs, rubs or gallops.   Lungs:  No respiratory distress.  Lungs clear to auscultation. No wheezes, rhonchi, or rales.   GI/GU:  Soft, +BS, nondistended, non-TTP. Obese.  Extremities:  No edema. Peripheral pulses normal. Boot intact to right foot/LE.   Skin:  Warm, dry. No rash or lesions present.   Neuro:  Non-focal. No obvious weakness.   Psych:  Affect normal, eye contact good, speech clear and coherent.      I attest that I, Bayard Hugger, personally documented this note while acting as scribe for [name], FNP.      Bayard Hugger, Scribe.  02/21/2020     The documentation recorded by the scribe accurately reflects the service I personally performed and the decisions made by me.    [name], FNP

## 2020-02-21 NOTE — Unmapped (Signed)
[place] Assessment of Medications Program (CAMP)                        RECRUITMENT SUMMARY NOTE       Patient was identified for COPD [facility] symptom assessment.         Tenna Delaine, CPhT  Clinical Operations Specialist   Assessment of Medications Program (CAMP)  p [phone]  -  f [phone]

## 2020-02-22 DIAGNOSIS — G43909 Migraine, unspecified, not intractable, without status migrainosus: Principal | ICD-10-CM

## 2020-02-22 DIAGNOSIS — E1165 Type 2 diabetes mellitus with hyperglycemia: Principal | ICD-10-CM

## 2020-02-22 DIAGNOSIS — Z794 Long term (current) use of insulin: Principal | ICD-10-CM

## 2020-02-22 MED ORDER — FAMOTIDINE 20 MG TABLET
ORAL_TABLET | Freq: Two times a day (BID) | ORAL | 0 refills | 90 days | Status: CP
Start: 2020-02-22 — End: 2021-02-21

## 2020-02-22 MED ORDER — ALPRAZOLAM 0.5 MG TABLET
ORAL_TABLET | Freq: Three times a day (TID) | ORAL | 1 refills | 30 days
Start: 2020-02-22 — End: 2021-02-21

## 2020-02-22 MED ORDER — TOPIRAMATE 50 MG TABLET
ORAL_TABLET | Freq: Two times a day (BID) | ORAL | 1 refills | 90 days
Start: 2020-02-22 — End: 2021-02-21

## 2020-02-22 MED ORDER — INSULIN SYRINGE U-100 WITH NEEDLE 1 ML 31 GAUGE X 5/16" (8 MM)
0 refills | 0 days | Status: CP
Start: 2020-02-22 — End: 2021-02-21

## 2020-02-22 MED ORDER — BLOOD SUGAR DIAGNOSTIC STRIPS
Freq: Three times a day (TID) | 1 refills | 0 days | Status: CP
Start: 2020-02-22 — End: 2021-02-21

## 2020-02-22 NOTE — Unmapped (Signed)
P/C requesting refill on Topamax, last ordered on 05/10/2019; famotidine last ordered on 02/21/2019; alprazolam last ordered 09/29/19; ulticare syringe last ordered 09/27/19 and a new accu-chek fast click, last visit 02/21/20

## 2020-03-02 ENCOUNTER — Ambulatory Visit: Payer: Self-pay

## 2020-03-03 ENCOUNTER — Other Ambulatory Visit: Payer: Self-pay

## 2020-03-03 ENCOUNTER — Ambulatory Visit
Admission: EM | Admit: 2020-03-03 | Discharge: 2020-03-03 | Disposition: A | Discharge status: Home / Self Care | Payer: Medicare Other

## 2020-03-03 DIAGNOSIS — H60392 Other infective otitis externa, left ear: Secondary | ICD-10-CM | POA: Diagnosis not present

## 2020-03-03 MED ORDER — CIPROFLOXACIN-DEXAMETHASONE 0.3-0.1 % OT SUSP: 4.0000 [drp] | Freq: Two times a day (BID) | OTIC | 0 refills | Status: AC

## 2020-03-03 NOTE — ED Triage Notes (Signed)
Patient complains of left ear pain that started 2 nights ago. States that pain has been constant and is worse if he touches the area.

## 2020-03-03 NOTE — ED Provider Notes (Signed)
[facility]    CSN: [id number] Arrival date & time: 03/03/20  1319      History   Chief Complaint Chief Complaint  Patient presents with  . Otalgia    left    HPI Jason Davidson is a 39 y.o. male with history of insulin-dependent diabetes and morbid obesity presents for left-sided ear pain x2 days.  He says he has increased pain when he touches the outer ear.  He also admits to some drainage from the ear.  Patient also admits to postnasal drainage.  He denies any fevers, fatigue, aches, sore throat, cough, nasal congestion, headaches, shortness of breath.  Denies any known Covid exposure.  Admits to personal history of Covid November 2020.  Patient not vaccinated for Covid.  He has no other complaints or concerns today.  HPI  Past Medical History:  Diagnosis Date  . Asthma   . Depressed   . Diabetes mellitus without complication (HCC)   . IBS (irritable bowel syndrome)   . Morbid obesity [facility])     Patient Active Problem List   Diagnosis Date Noted  . Hidradenitis suppurativa 04/29/2018  . Allergic rhinitis 12/23/2017  . Varicose veins of both lower extremities with pain 10/16/2017  . IBS (irritable bowel syndrome) 06/18/2017  . Chronic venous insufficiency 12/31/2016  . Lymphedema 12/31/2016  . Leg pain 12/31/2016  . Diabetes (HCC) 12/31/2016  . GERD (gastroesophageal reflux disease) 12/31/2016  . 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 H/O retinal detachment 10/01/2016  . No diabetic retinopathy in both eyes 10/01/2016  . Cellulitis of right lower extremity 06/19/2016  . Chronic joint pain 11/16/2015  . Skin infection 05/15/2015  . Depressed   . Tick bite 10/20/2014  . Primary insomnia 07/08/2014  . Candidal balanitis 02/16/2014  . Skin tag 01/17/2014  . Left shoulder pain 10/04/2013  . Anxiety 09/13/2013  . Diarrhea 09/13/2013  . Bleeding external hemorrhoids 09/03/2013  . NAFLD (nonalcoholic fatty liver disease)  08/12/2013  . Headache 07/05/2013  . Abdominal pain 03/05/2013  . Morbid (severe) obesity due to excess calories (HCC) 03/05/2013  . Suicidal ideation 02/09/2013  . Obstructive sleep apnea (adult) (pediatric) 07/21/2012  . 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    Past Surgical History:  Procedure Laterality Date  . EYE SURGERY         Home Medications    Prior to Admission medications   Medication Sig Start Date End Date Taking? Authorizing Provider  Adalimumab (HUMIRA PEN) 40 MG/0.4ML PNKT Inject into the skin. 09/10/18  Yes [provider]  AIMOVIG 70 MG/ML SOAJ  07/21/17  Yes [provider]  ALPRAZolam Prudy Feeler) 0.5 MG tablet Take 0.5 mg by mouth at bedtime as needed for anxiety.   Yes [provider]  amphetamine-dextroamphetamine (ADDERALL) 10 MG tablet Take 10 mg by mouth 2 (two) times daily with a meal.   Yes [provider]  azelastine (ASTELIN) 0.1 % nasal spray Place into the nose.   Yes [provider]  Colchicine (MITIGARE) 0.6 MG CAPS Take 1.2 mg on Day 1 of flare, followed by 0.6 mg one hour later. Day 2 take 0.6 mg BID until flare resolves. 08/01/17  Yes [provider]  cyclobenzaprine (FLEXERIL) 10 MG tablet Take by mouth. 01/12/15  Yes [provider]  cyclobenzaprine (FLEXERIL) 10 MG tablet Take 1 tablet (10 mg total) by mouth 3 (three) times daily as needed. 01/31/18  Yes Joni Reining, PA-C  dicyclomine (BENTYL) 10 MG  capsule Take 10 mg by mouth 3 (three) times daily before meals.   Yes [provider]  diphenoxylate-atropine (LOMOTIL) 2.5-0.025 MG tablet Take by mouth. 01/05/18  Yes [provider]  Fluticasone-Salmeterol (ADVAIR) 500-50 MCG/DOSE AEPB Inhale 1 puff into the lungs 2 (two) times daily.   Yes [provider]  gabapentin (NEURONTIN) 100 MG capsule Take 100 mg by mouth 3 (three) times daily.   Yes [provider]  gabapentin (NEURONTIN) 300 MG capsule Take 300 mg  by mouth 3 (three) times daily. 01/21/20  Yes [provider]  glipiZIDE (GLUCOTROL) 10 MG tablet Take 10 mg by mouth daily before breakfast.   Yes [provider]  glucose blood (PRECISION QID TEST) test strip Use 1 strip 3 times a day with meter 06/26/15  Yes [provider]  Insulin Syringe-Needle U-100 (BD INSULIN SYRINGE U/F) 31G X 5/16" 1 ML MISC Use as directed with the Lantus Insulin nightly 08/26/17  Yes [provider]  loperamide (IMODIUM) 2 MG capsule Take by mouth.   Yes [provider]  montelukast (SINGULAIR) 10 MG tablet Take by mouth. 03/31/17 03/03/20 Yes [provider]  pantoprazole (PROTONIX) 40 MG tablet Take 40 mg by mouth daily.   Yes [provider]  polyethylene glycol powder (GLYCOLAX/MIRALAX) powder take 17GM (DISSOLVED IN WATER) by mouth once daily 07/25/17  Yes [provider]  PROAIR HFA 108 [id number] Base) MCG/ACT inhaler  07/21/17  Yes [provider]  promethazine (PHENERGAN) 25 MG tablet Take by mouth. 09/04/17 03/03/20 Yes [provider]  propranolol (INNOPRAN XL) 120 MG 24 hr capsule Take 120 mg by mouth at bedtime.   Yes [provider]  Spacer/Aero-Holding Chambers (AEROCHAMBER PLUS) inhaler Use as instructed 05/01/16  Yes Domenick Gong, MD  topiramate (TOPAMAX) 50 MG tablet Take 50 mg by mouth 2 (two) times daily.   Yes [provider]  traZODone (DESYREL) 100 MG tablet Take by mouth.   Yes [provider]  ciprofloxacin-dexamethasone (CIPRODEX) OTIC suspension Place 4 drops into the left ear 2 (two) times daily for 7 days. 03/03/20 03/10/20  Shirlee Latch, PA-C  DULoxetine (CYMBALTA) 60 MG capsule Take 120 mg by mouth daily.     [provider]  famotidine (PEPCID) 20 MG tablet Take by mouth. 10/15/18 10/15/19  [provider]  HUMALOG 100 UNIT/ML injection SMARTSIG:120 Unit(s) SUB-Q Daily 03/01/20   [provider]    hydrocortisone (ANUSOL-HC) 2.5 % rectal cream Place rectally. 12/16/16   [provider]  hydrocortisone (ANUSOL-HC) 25 MG suppository Place rectally. 01/14/17   [provider]  insulin glargine (LANTUS) 100 UNIT/ML injection Inject into the skin. 07/10/17 02/18/19  [provider]  insulin regular (NOVOLIN R,HUMULIN R) 250 units/2.62mL (100 units/mL) injection Inject into the skin 3 (three) times daily before meals.    [provider]  levothyroxine (SYNTHROID) 50 MCG tablet Take by mouth. 10/05/18 10/05/19  [provider]    Family History Family History  Problem Relation Age of Onset  . Diabetes Mother   . Diabetes Sister   . Diabetes Brother   . Diabetes Maternal Uncle   . Diabetes Maternal Grandmother   . Heart disease Maternal Grandmother   . Cancer Maternal Grandfather   . COPD Paternal Grandmother   . Cancer Paternal Grandfather   . Varicose Veins Paternal Grandfather     Social History Social History   Tobacco Use  . Smoking status: Never Smoker  . Smokeless tobacco: Never  Used  Vaping Use  . Vaping Use: Never used  Substance Use Topics  . Alcohol use: No  . Drug use: No     Allergies   Erythromycin, Penicillins, and Sulfa antibiotics   Review of Systems Review of Systems  Constitutional: Negative for fatigue and fever.  HENT: Positive for ear pain and postnasal drip. Negative for congestion, hearing loss, rhinorrhea, sinus pressure, sinus pain and sore throat.   Respiratory: Negative for cough and shortness of breath.   Gastrointestinal: Negative for abdominal pain, diarrhea, nausea and vomiting.  Musculoskeletal: Negative for myalgias.  Neurological: Negative for weakness, light-headedness and headaches.  Hematological: Negative for adenopathy.     Physical Exam Triage Vital Signs ED Triage Vitals  Enc Vitals Group     BP 03/03/20 1349 (!) 162/79     Pulse Rate 03/03/20 1349 68     Resp 03/03/20 1349 18      Temp 03/03/20 1349 98.3 F (36.8 C)     Temp Source 03/03/20 1349 Oral     SpO2 03/03/20 1349 99 %     Weight 03/03/20 1345 (!) 512 lb (232.2 kg)     Height 03/03/20 1345 5\' 11"  (1.803 m)     Head Circumference --      Peak Flow --      Pain Score 03/03/20 1345 8     Pain Loc --      Pain Edu? --      Excl. in GC? --    No data found.  Updated Vital Signs BP (!) 162/79 (BP Location: Left Arm)   Pulse 68   Temp 98.3 F (36.8 C) (Oral)   Resp 18   Ht 5\' 11"  (1.803 m)   Wt (!) 512 lb (232.2 kg)   SpO2 99%   BMI 71.41 kg/m       Physical Exam Vitals and nursing note reviewed.  Constitutional:      General: He is not in acute distress.    Appearance: He is well-developed. He is obese. He is not ill-appearing.  HENT:     Head: Normocephalic and atraumatic.     Right Ear: Hearing and tympanic membrane normal.     Left Ear: Hearing normal. Drainage (small amount of light yellow debris), swelling and tenderness (tragus) present. No mastoid tenderness. Tympanic membrane is injected. Tympanic membrane is not erythematous, retracted or bulging.     Nose: Nose normal.     Mouth/Throat:     Mouth: Mucous membranes are moist.     Pharynx: Oropharynx is clear.  Eyes:     General: No scleral icterus.    Conjunctiva/sclera: Conjunctivae normal.  Cardiovascular:     Rate and Rhythm: Normal rate and regular rhythm.     Heart sounds: No murmur heard.   Pulmonary:     Effort: Pulmonary effort is normal. No respiratory distress.     Breath sounds: Normal breath sounds.  Musculoskeletal:     Cervical back: Neck supple.  Skin:    General: Skin is warm and dry.  Neurological:     General: No focal deficit present.     Mental Status: He is alert. Mental status is at baseline.     Motor: No weakness.     Gait: Gait normal.  Psychiatric:        Mood and Affect: Mood normal.        Behavior: Behavior normal.        Thought Content: Thought content normal.  UC Treatments /  Results  Labs (all labs ordered are listed, but only abnormal results are displayed) Labs Reviewed - No data to display  EKG   Radiology No results found.  Procedures Procedures (including critical care time)  Medications Ordered in UC Medications - No data to display  Initial Impression / Assessment and Plan / UC Course  I have reviewed the triage vital signs and the nursing notes.  Pertinent labs & imaging results that were available during my care of the patient were reviewed by me and considered in my medical decision making (see chart for details).   Exam consistent with otitis externa.  Treating with Ciprodex.  Patient declines any Covid testing today.  Advised him to follow-up as needed with our clinic for any new or worsening symptoms or if he is not better in the next few days.  Final Clinical Impressions(s) / UC Diagnoses   Final diagnoses:  Other infective acute otitis externa of left ear     Discharge Instructions     OTITIS EXTERNA: On exam, you have Swimmer's ear. Please see attached discharge notes and instructions including how to use ear drops. Avoid swimming until symptoms resolve. May take Tylenol/Motrin for ear pain, but ear drops should begin to improve pain in 1-3 days. Return or visit PCP if symptoms worsen or do not improve with prescribed ear drops.     ED Prescriptions    Medication Sig Dispense Auth. Provider   ciprofloxacin-dexamethasone (CIPRODEX) OTIC suspension Place 4 drops into the left ear 2 (two) times daily for 7 days. 7.5 mL Shirlee Latch, PA-C     PDMP not reviewed this encounter.   Shirlee Latch, PA-C 03/03/20 1414

## 2020-03-03 NOTE — Discharge Instructions (Addendum)
OTITIS EXTERNA: On exam, you have Swimmer's ear. Please see attached discharge notes and instructions including how to use ear drops. Avoid swimming until symptoms resolve. May take Tylenol/Motrin for ear pain, but ear drops should begin to improve pain in 1-3 days. Return or visit PCP if symptoms worsen or do not improve with prescribed ear drops.  

## 2020-03-15 ENCOUNTER — Ambulatory Visit (INDEPENDENT_AMBULATORY_CARE_PROVIDER_SITE_OTHER): Payer: Medicare Other | Admitting: Podiatry

## 2020-03-15 ENCOUNTER — Encounter: Payer: Self-pay | Admitting: Podiatry

## 2020-03-15 ENCOUNTER — Ambulatory Visit: Payer: Medicare Other | Admitting: Podiatry

## 2020-03-15 ENCOUNTER — Other Ambulatory Visit: Payer: Self-pay

## 2020-03-15 ENCOUNTER — Ambulatory Visit (INDEPENDENT_AMBULATORY_CARE_PROVIDER_SITE_OTHER): Payer: Medicare Other

## 2020-03-15 DIAGNOSIS — L732 Hidradenitis suppurativa: Principal | ICD-10-CM

## 2020-03-15 DIAGNOSIS — B351 Tinea unguium: Secondary | ICD-10-CM

## 2020-03-15 DIAGNOSIS — S92354D Nondisplaced fracture of fifth metatarsal bone, right foot, subsequent encounter for fracture with routine healing: Secondary | ICD-10-CM

## 2020-03-15 DIAGNOSIS — E1142 Type 2 diabetes mellitus with diabetic polyneuropathy: Secondary | ICD-10-CM | POA: Diagnosis not present

## 2020-03-15 DIAGNOSIS — M79676 Pain in unspecified toe(s): Secondary | ICD-10-CM

## 2020-03-15 MED ORDER — HUMIRA PEN CITRATE FREE 40 MG/0.4 ML
SUBCUTANEOUS | 3 refills | 0.00000 days | Status: CP
Start: 2020-03-15 — End: ?
  Filled 2020-03-24: qty 8, 28d supply, fill #0

## 2020-03-15 NOTE — Progress Notes (Signed)
He presents today for follow-up of his stress fracture to his fifth metatarsal base of his right foot.  States that is feeling so much better now that he has been wearing his cam walker all the time.  He states that the swelling is gone down a lot still little tender if he steps down without the cam walker on.  He is also complaining of painful elongated toenails states that his blood sugar started become a little more elevated because he is not watching his diet as well.  Objective: Vital signs are stable alert and oriented x3.  Pulses are palpable.  Toenails are long thick yellow dystrophic-like mycotic no tenderness on palpation of the fifth metatarsal base of the right foot radiographically it appears to be healing and must of been an endosteal stress reaction we cannot even see the line at this point.  Assessment: Well-healing endosteal stress reaction fifth met base right pain in limb secondary to onychomycosis.  Plan: Debridement of toenails 1 through 5 bilateral.  Follow-up with him as needed.

## 2020-03-16 ENCOUNTER — Ambulatory Visit: Payer: Medicare Other | Admitting: Podiatry

## 2020-03-20 DIAGNOSIS — L732 Hidradenitis suppurativa: Principal | ICD-10-CM

## 2020-03-21 NOTE — Unmapped (Signed)
[facility] Clinical Assessment & Refill Coordination Note    Logan Quinn, DOB: 11-04-1980  Phone: [phone] (home)     All above HIPAA information was verified with patient.     Was a Nurse, learning disability used for this call? No    Specialty Medication(s):   Inflammatory Disorders: Humira     Current Outpatient Medications   Medication Sig Dispense Refill   ??? acetaminophen (TYLENOL) 500 MG tablet Take 2 tablets (1,000 mg total) by mouth Three (3) times a day. 30 tablet 0   ??? ADVAIR DISKUS 500-50 mcg/dose diskus Inhale 1 puff 2 (two) times a day. 60 each 11   ??? albuterol 2.5 mg /3 mL (0.083 %) nebulizer solution Inhale 3 mL (2.5 mg total) by nebulization every four (4) hours as needed for wheezing. 180 mL 2   ??? albuterol HFA 90 mcg/actuation inhaler Inhale 2 puffs every six (6) hours as needed for wheezing. 3 Inhaler 1   ??? ALPRAZolam (XANAX) 0.5 MG tablet Take 1 tablet (0.5 mg total) by mouth Three (3) times a day. 90 tablet 1   ??? blood sugar diagnostic Strp by Other route Three (3) times a day before meals. 300 each 1   ??? blood-glucose meter kit Use as directed 1 each 0   ??? cephalexin (KEFLEX) 500 MG capsule Take 1 capsule (500 mg total) by mouth Four (4) times a day. 40 capsule 0   ??? cetirizine (ZYRTEC) 10 MG tablet Take 1 tablet (10 mg total) by mouth nightly as needed for allergies. 90 tablet 1   ??? chlorhexidine (PERIDEX) 0.12 % solution 15 mL by Mouth route Two (2) times a day. 437 mL 0   ??? clindamycin (CLEOCIN T) 1 % external solution Apply topically Two (2) times a day. 60 mL 4   ??? clobetasoL (TEMOVATE) 0.05 % ointment Apply daily to painful affected areas if needed for flares, then stop 15 g 1   ??? cyclobenzaprine (FLEXERIL) 10 MG tablet Take 1 tablet (10 mg total) by mouth Three (3) times a day as needed for muscle spasms. 60 tablet 0   ??? dextroamphetamine-amphetamine (ADDERALL) 20 mg tablet Take 1 tablet by mouth Two (2) times a day. 30 tablet 0   ??? dicyclomine (BENTYL) 10 mg capsule Take 1 capsule (10 mg total) by mouth Three (3) times a day. 270 capsule 1   ??? diphenoxylate-atropine (LOMOTIL) 2.5-0.025 mg per tablet Take 1 tablet by mouth two (2) times a day as needed for diarrhea. 30 tablet 5   ??? DULoxetine (CYMBALTA) 60 MG capsule Take 1 capsule (60 mg total) by mouth Two (2) times a day. 180 capsule 0   ??? empty container (SHARPS CONTAINER) Misc Use as directed to dispose of Humira needles 1 each 2   ??? empty container Misc Use as directed to dispose of Humira injections 1 each 2   ??? erenumab-aooe 140 mg/mL AtIn Inject 140 mg under the skin every twenty-eight (28) days. 1 mL 6   ??? famotidine (PEPCID) 20 MG tablet Take 1 tablet (20 mg total) by mouth Two (2) times a day. 180 tablet 0   ??? fluticasone propionate (FLONASE) 50 mcg/actuation nasal spray 1 spray into each nostril Two (2) times a day. 16 g 5   ??? gabapentin (NEURONTIN) 300 MG capsule Take 300 mg by mouth Three (3) times a day.     ??? glipiZIDE (GLUCOTROL) 10 MG tablet Take 2 tablets (20 mg total) by mouth Two (  2) times a day (30 minutes before a meal). 360 tablet 1   ??? HUMIRA PEN CITRATE FREE 40 MG/0.4 ML Inject the contents of 2 pens (80mg  total) under the skin every seven (7) days. 8 each 3   ??? hydrocortisone (PROCTOSOL HC) 2.5 % rectal cream Insert 1 application into the rectum two (2) times a day as needed. 28.35 g 2   ??? hydrOXYzine (ATARAX) 25 MG tablet TAKE 1/2 TO 4 TABLETS BY MOUTH UP TO THREE TIMES DAILY AS NEEDED FOR ANXIETY     ??? ibuprofen (ADVIL,MOTRIN) 800 MG tablet Take 1 tablet (800 mg total) by mouth every eight (8) hours as needed. 90 tablet 1   ??? insulin pump cartridge Crtg Inject 120 Units under the skin daily. 5 each 5   ??? insulin syringe-needle U-100 (BD INSULIN SYRINGE ULTRA-FINE) 1 mL 31 gauge x 5/16 (8 mm) Syrg Use up to 5 x daily to administer insulin. 100 each 1   ??? insulin syringe-needle U-100 (ULTICARE) 1 mL 31 gauge x 5/16 (8 mm) Syrg Use up to 5 times a day to administer insulin 100 each 0   ??? ketoconazole (NIZORAL) 2 % cream Apply 1 application topically daily. 15 g 5   ??? levothyroxine (SYNTHROID) 50 MCG tablet Take 1 tablet (50 mcg total) by mouth daily. 90 tablet 1   ??? lidocaine (XYLOCAINE) 5 % ointment lidocaine 5 % topical ointment     ??? miscellaneous medical supply Misc Gauze pads 4 x 4 inches and paper tape 30 each 0   ??? miscellaneous medical supply Misc Nebulizer- Use as directed with albuterol nebs for SOB 1 each 0   ??? montelukast (SINGULAIR) 10 mg tablet Take 1 tablet (10 mg total) by mouth daily. 90 each 1   ??? nystatin (MYCOSTATIN) 100,000 unit/gram powder Apply to affected area 3 times daily 30 g 1   ??? oxybutynin (DITROPAN) 5 MG tablet Take 1 tablet (5 mg total) by mouth Two (2) times a day. 180 tablet 1   ??? pantoprazole (PROTONIX) 40 MG tablet Take 1 tablet (40 mg total) by mouth Two (2) times a day. 180 tablet 1   ??? polyethylene glycol (GLYCOLAX) 17 gram/dose powder take 17GM (DISSOLVED IN WATER) by mouth once daily     ??? PROAIR HFA 90 mcg/actuation inhaler Inhale 2 puffs every four (4) hours as needed for wheezing. 3 Inhaler 1   ??? propranoloL (INDERAL LA) 120 mg 24 hr capsule Take 1 capsule (120 mg total) by mouth daily. 90 capsule 1   ??? sour cherry extract (TART CHERRY EXTRACT) 1,000 mg cap      ??? topiramate (TOPAMAX) 50 MG tablet Take 1.5 tablets (75 mg total) by mouth Two (2) times a day. 270 tablet 1   ??? traZODone (DESYREL) 100 MG tablet Take 2 tablets (200 mg total) by mouth nightly. 180 tablet 0     No current facility-administered medications for this visit.        Changes to medications: Logan Quinn reports no changes at this time.    Allergies   Allergen Reactions   ??? Erythromycin Nausea And Vomiting     Other reaction(s): Vomiting   ??? Penicillins Rash, Swelling and Hives   ??? Sulfa (Sulfonamide Antibiotics) Nausea And Vomiting and Nausea Only   ??? Other    ??? Erythromycin Base Rash   ??? Sulfasalazine Nausea And Vomiting       Changes to allergies: No    SPECIALTY MEDICATION ADHERENCE HUMIRA CF PEN  40 mg/ 0.4 ml: 1 days of medicine on hand (1 BOX=1 DOSE ON HAND DUE TO BE INJECTED: 03/23/20)      Medication Adherence    Patient reported X missed doses in the last month: 0  Specialty Medication: Humira CF 40 mg/0.4 ml   Patient is on additional specialty medications: No  Informant: patient  Confirmed plan for next specialty medication refill: delivery by pharmacy  Refills needed for supportive medications: not needed          Specialty medication(s) dose(s) confirmed: Regimen is correct and unchanged.     Are there any concerns with adherence? No    Adherence counseling provided? Not needed    CLINICAL MANAGEMENT AND INTERVENTION      Clinical Benefit Assessment:    Do you feel the medicine is effective or helping your condition? Yes    Clinical Benefit counseling provided? Not needed    Adverse Effects Assessment:    Are you experiencing any side effects? No    Are you experiencing difficulty administering your medicine? No    Quality of Life Assessment:    How many days over the past month did your hidradenitis suppurativa  keep you from your normal activities? For example, brushing your teeth or getting up in the morning. Patient declined to answer    Have you discussed this with your provider? Not needed    Therapy Appropriateness:    Is therapy appropriate? Yes, therapy is appropriate and should be continued    DISEASE/MEDICATION-SPECIFIC INFORMATION      For patients on injectable medications: Patient currently has 1 doses left.  Next injection is scheduled for 03/23/20.    PATIENT SPECIFIC NEEDS     - Does the patient have any physical, cognitive, or cultural barriers? No    - Is the patient high risk? No    - Does the patient require a Care Management Plan? No     - Does the patient require physician intervention or other additional services (i.e. nutrition, smoking cessation, social work)? No      SHIPPING     Specialty Medication(s) to be Shipped:   Inflammatory Disorders: Humira    Other medication(s) to be shipped: No additional medications requested for fill at this time     Changes to insurance: No    Delivery Scheduled: Yes, Expected medication delivery date: 03/24/20.     Medication will be delivered via Same Day Courier to the confirmed prescription address in [facility].    The patient will receive a drug information handout for each medication shipped and additional FDA Medication Guides as required.  Verified that patient has previously received a Conservation officer, historic buildings.    All of the patient's questions and concerns have been addressed.    Marletta Lor   [facility] Specialty Pharmacist

## 2020-03-24 MED FILL — HUMIRA PEN CITRATE FREE 40 MG/0.4 ML: 28 days supply | Qty: 8 | Fill #0 | Status: AC

## 2020-03-27 MED ORDER — CLINDAMYCIN 1 % LOTION
0 days
Start: 2020-03-27 — End: ?

## 2020-04-06 ENCOUNTER — Other Ambulatory Visit: Payer: Self-pay

## 2020-04-06 ENCOUNTER — Ambulatory Visit
Admission: EM | Admit: 2020-04-06 | Discharge: 2020-04-06 | Disposition: A | Discharge status: Home / Self Care | Payer: Medicare Other | Attending: Emergency Medicine | Admitting: Emergency Medicine

## 2020-04-06 DIAGNOSIS — R051 Acute cough: Secondary | ICD-10-CM | POA: Diagnosis present

## 2020-04-06 DIAGNOSIS — J320 Chronic maxillary sinusitis: Secondary | ICD-10-CM

## 2020-04-06 DIAGNOSIS — Z881 Allergy status to other antibiotic agents status: Secondary | ICD-10-CM | POA: Insufficient documentation

## 2020-04-06 DIAGNOSIS — J40 Bronchitis, not specified as acute or chronic: Secondary | ICD-10-CM

## 2020-04-06 DIAGNOSIS — Z79899 Other long term (current) drug therapy: Secondary | ICD-10-CM | POA: Insufficient documentation

## 2020-04-06 DIAGNOSIS — Z20822 Contact with and (suspected) exposure to covid-19: Secondary | ICD-10-CM | POA: Insufficient documentation

## 2020-04-06 DIAGNOSIS — Z882 Allergy status to sulfonamides status: Secondary | ICD-10-CM | POA: Insufficient documentation

## 2020-04-06 DIAGNOSIS — Z880 Allergy status to penicillin: Secondary | ICD-10-CM | POA: Insufficient documentation

## 2020-04-06 MED ORDER — PROMETHAZINE-DM 6.25-15 MG/5ML PO SYRP: 5.0000 mL | ORAL_SOLUTION | Freq: Four times a day (QID) | ORAL | 0 refills | Status: DC | PRN

## 2020-04-06 MED ORDER — AEROCHAMBER MV MISC: 2 refills | Status: DC

## 2020-04-06 MED ORDER — DOXYCYCLINE HYCLATE 100 MG PO CAPS: 100.0000 mg | ORAL_CAPSULE | Freq: Two times a day (BID) | ORAL | 0 refills | Status: DC

## 2020-04-06 MED ORDER — ALBUTEROL SULFATE HFA 108 (90 BASE) MCG/ACT IN AERS
1.0000 | INHALATION_SPRAY | Freq: Four times a day (QID) | RESPIRATORY_TRACT | 0 refills | Status: AC | PRN
Start: 2020-04-06 — End: ?

## 2020-04-06 NOTE — ED Triage Notes (Signed)
Patient presents to Urgent Care with complaints of cough and congestion for 2 weeks, some chest pressure for a couple days. Reports history of chronic bronchitis.

## 2020-04-06 NOTE — ED Provider Notes (Signed)
[facility]    CSN: [id number] Arrival date & time: 04/06/20  1318      History   Chief Complaint Chief Complaint  Patient presents with  . Cough    HPI Jason Davidson is a 39 y.o. male.   39 year old male here for evaluation of cough, nasal congestion and chest pressure.  Patient reports that he has been having the symptoms for 2 weeks.  He has a history of bronchitis and uses an albuterol inhaler which he is currently out of.  Patient reports that he has a productive cough for green sputum, wheezing, ear pressure, green nasal discharge.  Patient denies fever, shortness of breath, sweating, body aches, changes to taste or smell, nausea, vomiting, diarrhea, or sick contacts.     Past Medical History:  Diagnosis Date  . Asthma   . Depressed   . Diabetes mellitus without complication (HCC)   . IBS (irritable bowel syndrome)   . Morbid obesity [facility])     Patient Active Problem List   Diagnosis Date Noted  . Hidradenitis suppurativa 04/29/2018  . Allergic rhinitis 12/23/2017  . Varicose veins of both lower extremities with pain 10/16/2017  . IBS (irritable bowel syndrome) 06/18/2017  . Chronic venous insufficiency 12/31/2016  . Lymphedema 12/31/2016  . Leg pain 12/31/2016  . Diabetes (HCC) 12/31/2016  . GERD (gastroesophageal reflux disease) 12/31/2016  . 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 H/O retinal detachment 10/01/2016  . No diabetic retinopathy in both eyes 10/01/2016  . Cellulitis of right lower extremity 06/19/2016  . Chronic joint pain 11/16/2015  . Skin infection 05/15/2015  . Depressed   . Tick bite 10/20/2014  . Primary insomnia 07/08/2014  . Candidal balanitis 02/16/2014  . Skin tag 01/17/2014  . Left shoulder pain 10/04/2013  . Anxiety 09/13/2013  . Diarrhea 09/13/2013  . Bleeding external hemorrhoids 09/03/2013  . NAFLD (nonalcoholic fatty liver disease) 95/28/4132  . Headache 07/05/2013  .  Abdominal pain 03/05/2013  . Morbid (severe) obesity due to excess calories (HCC) 03/05/2013  . Suicidal ideation 02/09/2013  . Obstructive sleep apnea (adult) (pediatric) 07/21/2012  . 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    Past Surgical History:  Procedure Laterality Date  . EYE SURGERY         Home Medications    Prior to Admission medications   Medication Sig Start Date End Date Taking? Authorizing Provider  Adalimumab (HUMIRA PEN) 40 MG/0.4ML PNKT Inject into the skin. 09/10/18   [provider]  AIMOVIG 70 MG/ML SOAJ  07/21/17   [provider]  albuterol (VENTOLIN HFA) 108 (90 Base) MCG/ACT inhaler Inhale 1-2 puffs into the lungs every 6 (six) hours as needed for wheezing or shortness of breath. 04/06/20   Becky Augusta, NP  ALPRAZolam Prudy Feeler) 0.5 MG tablet Take 0.5 mg by mouth at bedtime as needed for anxiety.    [provider]  amphetamine-dextroamphetamine (ADDERALL) 10 MG tablet Take 10 mg by mouth 2 (two) times daily with a meal.    [provider]  azelastine (ASTELIN) 0.1 % nasal spray Place into the nose.    [provider]  Colchicine (MITIGARE) 0.6 MG CAPS Take 1.2 mg on Day 1 of flare, followed by 0.6 mg one hour later. Day 2 take 0.6 mg BID until flare resolves. 08/01/17   [provider]  cyclobenzaprine (FLEXERIL) 10 MG tablet Take by mouth. 01/12/15   [provider]  cyclobenzaprine (FLEXERIL) 10 MG tablet Take 1 tablet (  10 mg total) by mouth 3 (three) times daily as needed. 01/31/18   Joni Reining, PA-C  dicyclomine (BENTYL) 10 MG capsule Take 10 mg by mouth 3 (three) times daily before meals.    [provider]  diphenoxylate-atropine (LOMOTIL) 2.5-0.025 MG tablet Take by mouth. 01/05/18   [provider]  doxycycline (VIBRAMYCIN) 100 MG capsule Take 1 capsule (100 mg total) by mouth 2 (two) times daily. 04/06/20   Becky Augusta, NP  DULoxetine (CYMBALTA) 60 MG capsule Take 120 mg by mouth  daily.     [provider]  famotidine (PEPCID) 20 MG tablet Take by mouth. 10/15/18 10/15/19  [provider]  Fluticasone-Salmeterol (ADVAIR) 500-50 MCG/DOSE AEPB Inhale 1 puff into the lungs 2 (two) times daily.    [provider]  gabapentin (NEURONTIN) 100 MG capsule Take 100 mg by mouth 3 (three) times daily.    [provider]  gabapentin (NEURONTIN) 300 MG capsule Take 300 mg by mouth 3 (three) times daily. 01/21/20   [provider]  glipiZIDE (GLUCOTROL) 10 MG tablet Take 10 mg by mouth daily before breakfast.    [provider]  glucose blood (PRECISION QID TEST) test strip Use 1 strip 3 times a day with meter 06/26/15   [provider]  HUMALOG 100 UNIT/ML injection SMARTSIG:120 Unit(s) SUB-Q Daily 03/01/20   [provider]  hydrocortisone (ANUSOL-HC) 2.5 % rectal cream Place rectally. 12/16/16   [provider]  hydrocortisone (ANUSOL-HC) 25 MG suppository Place rectally. 01/14/17   [provider]  insulin glargine (LANTUS) 100 UNIT/ML injection Inject into the skin. 07/10/17 02/18/19  [provider]  insulin regular (NOVOLIN R,HUMULIN R) 250 units/2.17mL (100 units/mL) injection Inject into the skin 3 (three) times daily before meals.    [provider]  Insulin Syringe-Needle U-100 (BD INSULIN SYRINGE U/F) 31G X 5/16" 1 ML MISC Use as directed with the Lantus Insulin nightly 08/26/17   [provider]  levothyroxine (SYNTHROID) 50 MCG tablet Take by mouth. 10/05/18 10/05/19  [provider]  loperamide (IMODIUM) 2 MG capsule Take by mouth.    [provider]  montelukast (SINGULAIR) 10 MG tablet Take by mouth. 03/31/17 03/03/20  [provider]  pantoprazole (PROTONIX) 40 MG tablet Take 40 mg by mouth daily.    [provider]  polyethylene glycol powder (GLYCOLAX/MIRALAX) powder take 17GM (DISSOLVED IN WATER) by mouth once daily 07/25/17   [provider]  PROAIR HFA 108 [phone] Base) MCG/ACT inhaler  07/21/17   [provider]  promethazine (PHENERGAN) 25 MG tablet Take by mouth. 09/04/17 03/03/20  [provider]  promethazine-dextromethorphan (PROMETHAZINE-DM) 6.25-15 MG/5ML syrup Take 5 mLs by mouth 4 (four) times daily as needed. 04/06/20   Becky Augusta, NP  propranolol (INNOPRAN XL) 120 MG 24 hr capsule Take 120 mg by mouth at bedtime.    [provider]  Spacer/Aero-Holding Chambers (AEROCHAMBER MV) inhaler Use as instructed 04/06/20   Becky Augusta, NP  Spacer/Aero-Holding Chambers (AEROCHAMBER PLUS) inhaler Use as instructed 05/01/16   Domenick Gong, MD  topiramate (TOPAMAX) 50 MG tablet Take 50 mg by mouth 2 (two) times daily.    [provider]  traZODone (DESYREL) 100 MG tablet Take by mouth.    [provider]    Family History Family History  Problem Relation Age of Onset  . Diabetes Mother   . Depression Mother   . Diabetes Sister   . Diabetes Brother   . Diabetes  Maternal Uncle   . Diabetes Maternal Grandmother   . Heart disease Maternal Grandmother   . Cancer Maternal Grandfather   . COPD Paternal Grandmother   . Cancer Paternal Grandfather   . Varicose Veins Paternal Grandfather   . Healthy Father     Social History Social History   Tobacco Use  . Smoking status: Never Smoker  . Smokeless tobacco: Never Used  Vaping Use  . Vaping Use: Never used  Substance Use Topics  . Alcohol use: No  . Drug use: No     Allergies   Erythromycin, Penicillins, and Sulfa antibiotics   Review of Systems Review of Systems  Constitutional: Negative for activity change, appetite change and fever.  HENT: Positive for congestion, ear pain, rhinorrhea and sinus pressure. Negative for sore throat.   Respiratory: Positive for cough and wheezing. Negative for shortness of breath.   Cardiovascular: Negative for chest pain.  Gastrointestinal: Negative for diarrhea, nausea  and vomiting.  Musculoskeletal: Negative for arthralgias and myalgias.  Skin: Negative for rash.  Neurological: Negative for syncope and headaches.  Hematological: Negative.   Psychiatric/Behavioral: Negative.      Physical Exam Triage Vital Signs ED Triage Vitals  Enc Vitals Group     BP --      Pulse Rate 04/06/20 1340 65     Resp 04/06/20 1340 18     Temp 04/06/20 1340 98.1 F (36.7 C)     Temp Source 04/06/20 1340 Oral     SpO2 04/06/20 1340 97 %     Weight --      Height --      Head Circumference --      Peak Flow --      Pain Score 04/06/20 1330 0     Pain Loc --      Pain Edu? --      Excl. in GC? --    No data found.  Updated Vital Signs Pulse 65   Temp 98.1 F (36.7 C) (Oral)   Resp 18   SpO2 97%   Visual Acuity Right Eye Distance:   Left Eye Distance:   Bilateral Distance:    Right Eye Near:   Left Eye Near:    Bilateral Near:     Physical Exam Vitals and nursing note reviewed.  Constitutional:      General: He is not in acute distress.    Appearance: Normal appearance. He is obese. He is not toxic-appearing.  HENT:     Head: Normocephalic and atraumatic.     Right Ear: Tympanic membrane, ear canal and external ear normal. There is no impacted cerumen.     Left Ear: Tympanic membrane, ear canal and external ear normal. There is no impacted cerumen.     Nose: Congestion and rhinorrhea present.     Comments: Nasal mucosa is marked edema and erythema with a purulent discharge in the floor of the naris.    Mouth/Throat:     Mouth: Mucous membranes are moist.     Pharynx: Oropharynx is clear. Posterior oropharyngeal erythema present. No oropharyngeal exudate.     Comments: Patient has some posterior oropharyngeal erythema with green postnasal drip. Eyes:     General: No scleral icterus.    Extraocular Movements: Extraocular movements intact.     Conjunctiva/sclera: Conjunctivae normal.     Pupils: Pupils are equal, round, and reactive to light.   Cardiovascular:     Rate and Rhythm: Normal rate and regular rhythm.  Pulses: Normal pulses.     Heart sounds: Normal heart sounds. No murmur heard.  No gallop.   Pulmonary:     Effort: Pulmonary effort is normal.     Breath sounds: Normal breath sounds. No wheezing, rhonchi or rales.     Comments: She has wheezes and rhonchi in upper and middle lobes bilaterally. Musculoskeletal:        General: No swelling or tenderness. Normal range of motion.     Cervical back: Normal range of motion and neck supple.  Skin:    General: Skin is warm and dry.     Capillary Refill: Capillary refill takes less than 2 seconds.     Findings: No erythema or rash.  Neurological:     General: No focal deficit present.     Mental Status: He is alert and oriented to person, place, and time.  Psychiatric:        Mood and Affect: Mood normal.        Behavior: Behavior normal.        Thought Content: Thought content normal.        Judgment: Judgment normal.      UC Treatments / Results  Labs (all labs ordered are listed, but only abnormal results are displayed) Labs Reviewed  SARS CORONAVIRUS 2 (TAT 6-24 HRS)    EKG   Radiology No results found.  Procedures Procedures (including critical care time)  Medications Ordered in UC Medications - No data to display  Initial Impression / Assessment and Plan / UC Course  I have reviewed the triage vital signs and the nursing notes.  Pertinent labs & imaging results that were available during my care of the patient were reviewed by me and considered in my medical decision making (see chart for details).   Patient is here for evaluation of cough and nasal congestion x2 weeks.  Patient is also been experiencing some chest pressure for a few days.  Patient denies chest pain, sweats, or nausea.  Patient does endorse ear pressure, productive cough, wheezing, nasal discharge.  Physical exam shows injected and edematous nasal mucosa with purulent  discharge on the floors of his turbinates.  Patient also has some greenish postnasal drip.  Lung sounds have wheezes and rhonchi in the upper and middle lobes bilaterally.  Patient also has sinus tenderness in his maxillary sinuses bilaterally.  Patient physical exam consistent with sinusitis and bronchitis.  Will treat with doxycycline twice daily x10 days, albuterol inhaler for wheezing and chest congestion, and Promethazine DM for cough and congestion.  Patient can follow-up with his PCP.  Patient did request Covid swab which nursing collected.   Final Clinical Impressions(s) / UC Diagnoses   Final diagnoses:  Bronchitis  Chronic maxillary sinusitis     Discharge Instructions     Take the doxycycline twice a day with food for 10 days.  Use the albuterol inhaler with the spacer, 2 puffs every 4-6 hours, as needed for shortness of breath and wheezing.  You can use the Promethazine DM every 6 hours as needed for cough and congestion.  If your systems persist follow-up with your primary care provider.    ED Prescriptions    Medication Sig Dispense Auth. Provider   doxycycline (VIBRAMYCIN) 100 MG capsule Take 1 capsule (100 mg total) by mouth 2 (two) times daily. 20 capsule Becky Augustayan, Neymar Dowe, NP   albuterol (VENTOLIN HFA) 108 (90 Base) MCG/ACT inhaler Inhale 1-2 puffs into the lungs every 6 (six) hours as needed for  wheezing or shortness of breath. 18 g Becky Augusta, NP   Spacer/Aero-Holding Chambers (AEROCHAMBER MV) inhaler Use as instructed 1 each Becky Augusta, NP   promethazine-dextromethorphan (PROMETHAZINE-DM) 6.25-15 MG/5ML syrup Take 5 mLs by mouth 4 (four) times daily as needed. 118 mL Becky Augusta, NP     PDMP not reviewed this encounter.   Becky Augusta, NP 04/06/20 1400

## 2020-04-06 NOTE — Discharge Instructions (Addendum)
Take the doxycycline twice a day with food for 10 days.  Use the albuterol inhaler with the spacer, 2 puffs every 4-6 hours, as needed for shortness of breath and wheezing.  You can use the Promethazine DM every 6 hours as needed for cough and congestion.  If your systems persist follow-up with your primary care provider.

## 2020-04-07 LAB — SARS CORONAVIRUS 2 (TAT 6-24 HRS): SARS Coronavirus 2: NEGATIVE

## 2020-04-17 DIAGNOSIS — G4733 Obstructive sleep apnea (adult) (pediatric): Principal | ICD-10-CM

## 2020-04-19 NOTE — Unmapped (Signed)
[facility] Refill Coordination Note    Specialty Medication(s) to be Shipped:   Inflammatory Disorders: Humira    Other medication(s) to be shipped: No additional medications requested for fill at this time     Nijel Flink, DOB: 10-28-1980  Phone: [phone] (home)       All above HIPAA information was verified with patient.     Was a Nurse, learning disability used for this call? No    Completed refill call assessment today to schedule patient's medication shipment from the [facility] [phone]).       Specialty medication(s) and dose(s) confirmed: Regimen is correct and unchanged.   Changes to medications: Jaidev reports no changes at this time.  Changes to insurance: No  Questions for the pharmacist: No    Confirmed patient received Welcome Packet with first shipment. The patient will receive a drug information handout for each medication shipped and additional FDA Medication Guides as required.       DISEASE/MEDICATION-SPECIFIC INFORMATION        For patients on injectable medications: Patient currently has 1 doses left.  Next injection is scheduled for 04/20/2020.    SPECIALTY MEDICATION ADHERENCE     Medication Adherence    Patient reported X missed doses in the last month: 0  Specialty Medication: Humira CF 40 mg/0.4 ml  Patient is on additional specialty medications: No  Any gaps in refill history greater than 2 weeks in the last 3 months: no  Demonstrates understanding of importance of adherence: yes  Informant: patient  Reliability of informant: reliable  Confirmed plan for next specialty medication refill: delivery by pharmacy  Refills needed for supportive medications: not needed                    SHIPPING     Shipping address confirmed in Epic.     Delivery Scheduled: Yes, Expected medication delivery date: 04/21/2020.     Medication will be delivered via Same Day Courier to the prescription address in Epic WAM.    Seraiah Nowack D Kamariyah Timberlake   [facility] Specialty Technician

## 2020-04-21 MED FILL — HUMIRA PEN CITRATE FREE 40 MG/0.4 ML: 28 days supply | Qty: 8 | Fill #1

## 2020-04-21 MED FILL — HUMIRA PEN CITRATE FREE 40 MG/0.4 ML: 28 days supply | Qty: 8 | Fill #1 | Status: AC

## 2020-04-24 ENCOUNTER — Other Ambulatory Visit: Payer: Self-pay

## 2020-04-24 ENCOUNTER — Ambulatory Visit (INDEPENDENT_AMBULATORY_CARE_PROVIDER_SITE_OTHER): Payer: Medicare Other | Admitting: Podiatry

## 2020-04-24 ENCOUNTER — Encounter: Payer: Self-pay | Admitting: Podiatry

## 2020-04-24 DIAGNOSIS — L03031 Cellulitis of right toe: Secondary | ICD-10-CM | POA: Diagnosis not present

## 2020-04-24 DIAGNOSIS — L600 Ingrowing nail: Secondary | ICD-10-CM | POA: Diagnosis not present

## 2020-04-24 MED ORDER — NEOMYCIN-POLYMYXIN-HC 3.5-10000-1 OT SUSP: OTIC | 0 refills | Status: DC

## 2020-04-24 NOTE — Progress Notes (Signed)
  Subjective:  Patient ID: Jason Davidson, male    DOB: [birth year],  MRN: [id number]  Chief Complaint  Patient presents with  . Ingrown Toenail    Patient presents today for ingrown toenail right 2nd toe lateral border x 3 days.      39 y.o. male presents with the above complaint. History confirmed with patient.   Objective:  Physical Exam: warm, good capillary refill, no trophic changes or ulcerative lesions, normal DP and PT pulses and ingrown lateral border 2nd toe right with mild paronychia Assessment:   1. Ingrown toenail of right foot   2. Paronychia of second toe, right      Plan:  Patient was evaluated and treated and all questions answered.    Ingrown Nail, right -Patient elects to proceed with minor surgery to remove ingrown toenail today. Consent reviewed and signed by patient. -Ingrown nail excised. See procedure note. -Educated on post-procedure care including soaking. Written instructions provided and reviewed. -Patient to follow up in 2 weeks for nail check.  Procedure: Excision of Ingrown Toenail Location: Right 2nd toe lateral nail borders. Anesthesia: Lidocaine 1% plain; 1.5 mL and Marcaine 0.5% plain; 1.5 mL, digital block. Skin Prep: Betadine. Dressing: Silvadene; telfa; dry, sterile, compression dressing. Technique: Following skin prep, the toe was exsanguinated and a tourniquet was secured at the base of the toe. The affected nail border was freed, split with a nail splitter, and excised. Chemical matrixectomy was then performed with phenol and irrigated out with alcohol. The tourniquet was then removed and sterile dressing applied. Disposition: Patient tolerated procedure well. Patient to return in 2 weeks for follow-up.     Return in about 2 weeks (around 05/08/2020) for nail re-check.

## 2020-04-24 NOTE — Patient Instructions (Signed)

## 2020-04-27 MED ORDER — FAMOTIDINE 20 MG TABLET
ORAL_TABLET | Freq: Two times a day (BID) | ORAL | 2 refills | 90 days | Status: CP
Start: 2020-04-27 — End: 2021-04-27

## 2020-04-27 MED ORDER — LANCETS
11 refills | 0 days | Status: CP
Start: 2020-04-27 — End: ?

## 2020-04-27 MED ORDER — INSULIN SYRINGE U-100 WITH NEEDLE 1 ML 31 GAUGE X 5/16" (8 MM)
0 refills | 0.00000 days | Status: CP
Start: 2020-04-27 — End: 2021-04-27

## 2020-04-27 NOTE — Unmapped (Signed)
All refills sent per request.

## 2020-05-02 DIAGNOSIS — G43909 Migraine, unspecified, not intractable, without status migrainosus: Principal | ICD-10-CM

## 2020-05-02 DIAGNOSIS — R197 Diarrhea, unspecified: Principal | ICD-10-CM

## 2020-05-02 MED ORDER — DIPHENOXYLATE-ATROPINE 2.5 MG-0.025 MG TABLET
ORAL_TABLET | Freq: Two times a day (BID) | ORAL | 5 refills | 15 days | PRN
Start: 2020-05-02 — End: ?

## 2020-05-03 NOTE — Unmapped (Signed)
Patient is requesting the following refill  Requested Prescriptions     Pending Prescriptions Disp Refills   ??? diphenoxylate-atropine (LOMOTIL) 2.5-0.025 mg per tablet 30 tablet 5     Sig: Take 1 tablet by mouth two (2) times a day as needed for diarrhea.   ??? erenumab-aooe 140 mg/mL AtIn 1 mL 6     Sig: Inject 140 mg under the skin every twenty-eight (28) days.   ??? ALPRAZolam (XANAX) 0.5 MG tablet 90 tablet 1     Sig: Take 1 tablet (0.5 mg total) by mouth Three (3) times a day.       Last OV: 02/21/2020    Next OV: 05/24/2020    Opioid Monitoring   Urine Tox ScreenLast Drug Screen Date: Not Found  Opiate Confirmation Test Last Drug Screen Date: Not Found  Last Opioid Dispensed Provider: Jamas Lav, MD  Prescribed MEDD : 0  Last PDMP Review: 06/16/2019  3:25 PM  Last Pain Agreement Signed Date: Not Found         Naloxone Ordered: Not Found

## 2020-05-03 NOTE — Unmapped (Signed)
duplicate

## 2020-05-08 ENCOUNTER — Ambulatory Visit (INDEPENDENT_AMBULATORY_CARE_PROVIDER_SITE_OTHER): Payer: Medicare Other | Admitting: Podiatry

## 2020-05-08 ENCOUNTER — Other Ambulatory Visit: Payer: Self-pay

## 2020-05-08 ENCOUNTER — Ambulatory Visit: Payer: Medicare Other | Admitting: Podiatry

## 2020-05-08 DIAGNOSIS — G43909 Migraine, unspecified, not intractable, without status migrainosus: Principal | ICD-10-CM

## 2020-05-08 DIAGNOSIS — L600 Ingrowing nail: Secondary | ICD-10-CM | POA: Diagnosis not present

## 2020-05-08 MED ORDER — ALPRAZOLAM 0.5 MG TABLET
ORAL_TABLET | Freq: Three times a day (TID) | ORAL | 1 refills | 30 days | Status: CP
Start: 2020-05-08 — End: 2021-05-08

## 2020-05-08 MED ORDER — ERENUMAB-AOOE 140 MG/ML SUBCUTANEOUS AUTO-INJECTOR
SUBCUTANEOUS | 6 refills | 0.00000 days | Status: CP
Start: 2020-05-08 — End: ?

## 2020-05-08 NOTE — Unmapped (Signed)
[place] [facility] ([facility])                   MMRC SUMMARY NOTE      Patient was outreached via phone call for [facility] Services for their annual MMRC.     MMRC updated in flowsheet.     Most recent MMRC dyspnea scale: 3 Last MMRC date: 05/08/2020          Shanon Brow, PharmD, CPP  Clinical Pharmacist, [place] [facility] ([facility])  [facility] Clinic: [phone] - Fax: [phone]

## 2020-05-09 ENCOUNTER — Encounter: Payer: Self-pay | Admitting: Podiatry

## 2020-05-09 NOTE — Progress Notes (Signed)
  Subjective:  Patient ID: MONTGOMERY FAVOR, male    DOB: 07-03-1980,  MRN: [id number]  Chief Complaint  Patient presents with  . Ingrown Toenail    nail check    39 y.o. male returns with the above complaint. History confirmed with patient.   Objective:  Physical Exam: warm, good capillary refill, no trophic changes or ulcerative lesions, normal DP and PT pulses and R 2nd toe healing well, some drainage Assessment:   No diagnosis found.   Plan:  Patient was evaluated and treated and all questions answered.    Ingrown Nail, right -Can begin to leave OTA -D/C soaks and abx ointment   Return if symptoms worsen or fail to improve.

## 2020-05-10 DIAGNOSIS — L732 Hidradenitis suppurativa: Principal | ICD-10-CM

## 2020-05-10 MED ORDER — HYDROCORTISONE 2.5 % TOPICAL CREAM WITH PERINEAL APPLICATOR
Freq: Two times a day (BID) | RECTAL | 2 refills | 14 days | Status: CP | PRN
Start: 2020-05-10 — End: ?

## 2020-05-10 MED ORDER — KETOCONAZOLE 2 % TOPICAL CREAM
Freq: Every day | TOPICAL | 5 refills | 30 days | Status: CP
Start: 2020-05-10 — End: 2021-05-10

## 2020-05-10 NOTE — Unmapped (Signed)
Patient is requesting the following refill  Requested Prescriptions     Pending Prescriptions Disp Refills   ??? hydrocortisone (PROCTOSOL HC) 2.5 % rectal cream 28.35 g 2     Sig: Insert 1 application into the rectum two (2) times a day as needed.   ??? ketoconazole (NIZORAL) 2 % cream 15 g 5     Sig: Apply 1 application topically daily.       Last OV: 02/21/2020    Next OV: 05/24/2020.

## 2020-05-12 NOTE — Unmapped (Signed)
[place] [facility] ([facility])                    RECRUITMENT SUMMARY NOTE       Patient was outreached to remind about upcoming CAMP appointment. Patient confirmed appointment.    patient avail wife confirmed appt    Video Visit Reminder Checklist:   COS has verified the following with the patient/patient representative:   [x]  Verify access to device with camera (smartphone/tablet or computer with webcam)   []  Verify patient/patient representative is able access MyChart (documented in appt notes)  [x]  Verify patient/patient representative is able access link by text/email (documented appt notes)  []  Verify e-mail address AND/OR cell phone number with patient  [x]  Verify name of each inhaler with patient (Advair,Albuterol nebs + Inhaler)  [x]  Remind patient to have inhalers on hand at time of appointment  Select the following:   [] MyChart    []   Computer: Confirm Chrome is Therapist, art (send patient visit instructions for PC, if needed)   []   Phone/tablet: confirm MyChart app is downloaded on mobile device (send patient visit instructions for mobile again, if needed)  [x]  Link  []  Phone/tablet: Confirm Chrome is Therapist, art and patient is able to receive texts and has camera on phone  []  Computer: Confirm Chrome is Therapist, art and Ensure patient is able to access e-mail and has a Sport and exercise psychologist (MyChart access):  []  Provide patient with [facility] contact information for additional support- [phone]       Jerolyn Center  Clinical Operations Specialist/CPhT  [place] [facility] ([facility])  (P) [phone] ([phone]

## 2020-05-15 ENCOUNTER — Telehealth: Admit: 2020-05-15 | Discharge: 2020-05-16

## 2020-05-15 NOTE — Unmapped (Signed)
[place] [facility] ([facility]) Clinic-- COPD Best Practice Advisory (BPA) Alert Clinic      Dear Logan Pick, FNP    I am a clinical pharmacist working in the [facility] ([facility]) Clinic. I recently had the privilege of speaking with our shared patient Logan Quinn (March 13, 1981). Here is a summary of what came out of my meeting with him. Please refer to my clinic note dated 12/6 in Epic for additional details.    ??? Encourage completion of spirometry (as rescheduled 12/16).   ??? If diagnosis of COPD is confirmed, recommend addition of LAMA, such as Spiriva Respimat, 2.5 mcg, 2 inhalations daily.    Will defer these recommendations to you for consideration upon evaluation of patient during upcoming appointment on 12/15.    Please let me know if there is anything else I can do to help with Logan Quinn's care or if you have any questions.    Sincerely,    Shanon Brow, PharmD, CPP  Clinical Pharmacist, [place] [facility] ([facility])  [facility] Clinic: [phone] - Fax: [phone]

## 2020-05-15 NOTE — Unmapped (Signed)
[place] [facility] ([facility]) [facility] - COPD Best Practice Advisory (BPA) Alert Clinic    Logan Quinn, a 39 y.o. male who presents for an initial real-time audio and video visit with the [facility] as part of the COPD BPA Clinic for inhaler teaching.   PCP: [name], FNP    Recommendations to provider:  ??? Encourage completion of spirometry (as rescheduled 12/16).   ??? If diagnosis of COPD is confirmed, recommend addition of LAMA, such as Spiriva Respimat, 2.5 mcg, 2 inhalations daily.     ASSESSMENT & PLAN     COPD    Patient reported treatment: see table below in subjective & objective     Assessment / Plan  Goal of visit to reduce exacerbations and symptoms through inhaler education.  ?? Specific recommendations to provider noted above  ?? Educated patient on proper inhaler use technique and indication for Albuterol HFA, Advair Diskus by using pharmacist use of demo inhaler  and direct observation of patient use . Patient was able to verbalize understanding and all questions answered at this time. BPA alert addressed within chart   ?? Reviewed importance medication adherence with patient   ?? Determined if affordability of and access to medications is a concern for the patient.   ?? Patient identified as having a need for spirometry through a BPA. Spirometry ordered 11/18/19; encouraged patient to complete to verify diagnosis.  Patient has been scheduled for 12/16.  ?? Patient may benefit from LAMA therapy for COPD given MMRC.  Encourage completion of spirometry if able and/or trial of LAMA, such as Spiriva Respimat, 2.5 mcg, 2 inhalations daily.  _____________________________________________________    I spent 25 minutes on the phone with the patient on the date of service. I spent an additional 15 minutes on pre- and post-visit activities on the date of service.     The patient was physically located in [place] or a state in which I am permitted to provide care. The patient and/or parent/guardian understood that s/he may incur co-pays and cost sharing, and agreed to the telemedicine visit. The visit was reasonable and appropriate under the circumstances given the patient's presentation at the time.    The patient and/or parent/guardian has been advised of the potential risks and limitations of this mode of treatment (including, but not limited to, the absence of in-person examination) and has agreed to be treated using telemedicine. The patient's/patient's family's questions regarding telemedicine have been answered.     If the visit was completed in an ambulatory setting, the patient and/or parent/guardian has also been advised to contact their provider???s office for worsening conditions, and seek emergency medical treatment and/or call 911 if the patient deems either necessary.      Patient has completed the CAMP program. Questions/concerns were addressed to the patient's satisfaction. Patient provided the CAMP phone number for future needs. We have appreciated the opportunity to be part of this patient's care.     Future scheduled follow up visits include the following. Notes and recommendations from today's visit will be sent via Epic to [name], FNP  Future Appointments   Date Time Provider Department Center   05/15/2020  9:00 AM Philis Fendt, CPP [facility]   05/24/2020 10:40 AM [name] Rosita Fire, FNP [facility]   06/29/2020 10:40 AM Melvyn Novas, MD [facility]       Shanon Brow, PharmD, CPP  Clinical Pharmacist, [place] [facility] ([facility])  [facility] Clinic: [phone] - Fax: [phone]        Subjective   SUBJECTIVE & OBJECTIVE:      COPD Medications:    Drug Class Medication   Rescue Inhaler Albuterol  inhaler   Rescue Inhaler Albuterol  nebulizer   Maintenance Inhaler  (ICS Combo) Salmeterol + fluticasone (Advair Diskus, Advair HFA, AirDuo RespiClick)   inhaler     Affordability:  Patient states inhalers listed above are affordable to them at the time of this visit.     Adherence:   Patient denies missed doses of inhalers.    MMRC  Most recent MMRC dyspnea scale: 3 Last MMRC date: 05/08/2020     HEALTH MAINTENANCE  Immunizations:  Immunization History   Administered Date(s) Administered   ??? Hepatitis B Vaccine, Unspecified Formulation 12/27/1999, 04/29/2000   ??? Hepatitis B, Adult 03/05/2013, 04/05/2013, 09/03/2013   ??? INFLUENZA INJ MDCK PF, QUAD,(FLUCELVAX)(69MO AND UP EGG FREE) 03/31/2019   ??? INFLUENZA TIV (TRI) PF (IM) 10/08/2011   ??? Influenza Vaccine Quad (IIV4 PF) 20mo+ injectable 03/05/2013, 03/18/2014, 03/22/2015, 03/05/2016, 03/19/2017, 02/23/2018   ??? Influenza Virus Vaccine, unspecified formulation 03/19/2017, 02/23/2018, 03/25/2019   ??? PNEUMOCOCCAL POLYSACCHARIDE 23 12/30/2012   ??? PPD Test 10/28/2016   ??? TdaP 07/18/2008, 07/16/2013   ??? Tetanus and diptheria,(adult), adsorbed, 2Lf tetanus toxoid, PF 04/29/2000       Smoking:  Social History     Tobacco Use   Smoking Status Never Smoker   Smokeless Tobacco Never Used        Current Outpatient Medications on File Prior to Visit   Medication Sig Dispense Refill   ??? acetaminophen (TYLENOL) 500 MG tablet Take 2 tablets (1,000 mg total) by mouth Three (3) times a day. 30 tablet 0   ??? ADVAIR DISKUS 500-50 mcg/dose diskus Inhale 1 puff 2 (two) times a day. 60 each 11   ??? albuterol 2.5 mg /3 mL (0.083 %) nebulizer solution Inhale 3 mL (2.5 mg total) by nebulization every four (4) hours as needed for wheezing. 180 mL 2   ??? albuterol HFA 90 mcg/actuation inhaler Inhale 2 puffs every six (6) hours as needed for wheezing. 3 Inhaler 1   ??? ALPRAZolam (XANAX) 0.5 MG tablet Take 1 tablet (0.5 mg total) by mouth Three (3) times a day. 90 tablet 1   ??? blood sugar diagnostic Strp by Other route Three (3) times a day before meals. 300 each 1   ??? blood-glucose meter kit Use as directed 1 each 0   ??? cephalexin (KEFLEX) 500 MG capsule Take 1 capsule (500 mg total) by mouth Four (4) times a day. 40 capsule 0   ??? cetirizine (ZYRTEC) 10 MG tablet Take 1 tablet (10 mg total) by mouth nightly as needed for allergies. 90 tablet 1   ??? chlorhexidine (PERIDEX) 0.12 % solution 15 mL by Mouth route Two (2) times a day. 437 mL 0   ??? clobetasoL (TEMOVATE) 0.05 % ointment Apply daily to painful affected areas if needed for flares, then stop 15 g 1   ??? cyclobenzaprine (FLEXERIL) 10 MG tablet Take 1 tablet (10 mg total) by mouth Three (3) times a day as needed for muscle spasms. 60 tablet 0   ??? dextroamphetamine-amphetamine (ADDERALL) 20 mg tablet Take 1 tablet by mouth Two (2) times a day. 30 tablet 0   ??? dicyclomine (BENTYL) 10 mg capsule Take 1 capsule (10 mg total) by mouth Three (3) times a day. 270 capsule 1   ???  diphenoxylate-atropine (LOMOTIL) 2.5-0.025 mg per tablet Take 1 tablet by mouth two (2) times a day as needed for diarrhea. 30 tablet 5   ??? DULoxetine (CYMBALTA) 60 MG capsule Take 1 capsule (60 mg total) by mouth Two (2) times a day. 180 capsule 0   ??? empty container (SHARPS CONTAINER) Misc Use as directed to dispose of Humira needles 1 each 2   ??? empty container Misc Use as directed to dispose of Humira injections 1 each 2   ??? erenumab-aooe 140 mg/mL AtIn Inject 140 mg under the skin every twenty-eight (28) days. 1 mL 6   ??? famotidine (PEPCID) 20 MG tablet Take 1 tablet (20 mg total) by mouth Two (2) times a day. 180 tablet 2   ??? fluticasone propionate (FLONASE) 50 mcg/actuation nasal spray 1 spray into each nostril Two (2) times a day. 16 g 5   ??? gabapentin (NEURONTIN) 300 MG capsule Take 300 mg by mouth Three (3) times a day.     ??? glipiZIDE (GLUCOTROL) 10 MG tablet Take 2 tablets (20 mg total) by mouth Two (2) times a day (30 minutes before a meal). 360 tablet 1   ??? HUMIRA PEN CITRATE FREE 40 MG/0.4 ML Inject the contents of 2 pens (80mg  total) under the skin every seven (7) days. 8 each 3   ??? hydrocortisone (PROCTOSOL HC) 2.5 % rectal cream Insert 1 application into the rectum two (2) times a day as needed. 28.35 g 2   ??? hydrOXYzine (ATARAX) 25 MG tablet TAKE 1/2 TO 4 TABLETS BY MOUTH UP TO THREE TIMES DAILY AS NEEDED FOR ANXIETY     ??? ibuprofen (ADVIL,MOTRIN) 800 MG tablet Take 1 tablet (800 mg total) by mouth every eight (8) hours as needed. 90 tablet 1   ??? insulin pump cartridge Crtg Inject 120 Units under the skin daily. 5 each 5   ??? insulin syringe-needle U-100 (BD INSULIN SYRINGE ULTRA-FINE) 1 mL 31 gauge x 5/16 (8 mm) Syrg Use up to 5 x daily to administer insulin. 100 each 1   ??? insulin syringe-needle U-100 (ULTICARE) 1 mL 31 gauge x 5/16 (8 mm) Syrg Use up to 5 times a day to administer insulin 100 each 0   ??? ketoconazole (NIZORAL) 2 % cream Apply 1 application topically daily. 15 g 5   ??? lancets Misc Type 2 diabetes, poorly controlled 250.02   Test 2 times daily 100 each 11   ??? levothyroxine (SYNTHROID) 50 MCG tablet Take 1 tablet (50 mcg total) by mouth daily. 90 tablet 1   ??? lidocaine (XYLOCAINE) 5 % ointment lidocaine 5 % topical ointment     ??? miscellaneous medical supply Misc Gauze pads 4 x 4 inches and paper tape 30 each 0   ??? [EXPIRED] miscellaneous medical supply Misc Nebulizer- Use as directed with albuterol nebs for SOB 1 each 0   ??? montelukast (SINGULAIR) 10 mg tablet Take 1 tablet (10 mg total) by mouth daily. 90 each 1   ??? nystatin (MYCOSTATIN) 100,000 unit/gram powder Apply to affected area 3 times daily 30 g 1   ??? oxybutynin (DITROPAN) 5 MG tablet Take 1 tablet (5 mg total) by mouth Two (2) times a day. 180 tablet 1   ??? pantoprazole (PROTONIX) 40 MG tablet Take 1 tablet (40 mg total) by mouth Two (2) times a day. 180 tablet 1   ??? polyethylene glycol (GLYCOLAX) 17 gram/dose powder take 17GM (DISSOLVED IN WATER) by mouth once daily     ???  PROAIR HFA 90 mcg/actuation inhaler Inhale 2 puffs every four (4) hours as needed for wheezing. 3 Inhaler 1   ??? propranoloL (INDERAL LA) 120 mg 24 hr capsule Take 1 capsule (120 mg total) by mouth daily. 90 capsule 1   ??? sour cherry extract (TART CHERRY EXTRACT) 1,000 mg cap      ??? topiramate (TOPAMAX) 50 MG tablet Take 1.5 tablets (75 mg total) by mouth Two (2) times a day. 270 tablet 1   ??? traZODone (DESYREL) 100 MG tablet Take 2 tablets (200 mg total) by mouth nightly. 180 tablet 0     No current facility-administered medications on file prior to visit.

## 2020-05-16 NOTE — Unmapped (Signed)
[facility] Refill Coordination Note    Specialty Medication(s) to be Shipped:   Inflammatory Disorders: Humira    Other medication(s) to be shipped: No additional medications requested for fill at this time     Logan Quinn, DOB: Sep 11, 1980  Phone: [phone] (home)       All above HIPAA information was verified with patient.     Was a Nurse, learning disability used for this call? No    Completed refill call assessment today to schedule patient's medication shipment from the [facility] [phone]).       Specialty medication(s) and dose(s) confirmed: Regimen is correct and unchanged.   Changes to medications: [name] reports no changes at this time.  Changes to insurance: No  Questions for the pharmacist: No    Confirmed patient received Welcome Packet with first shipment. The patient will receive a drug information handout for each medication shipped and additional FDA Medication Guides as required.       DISEASE/MEDICATION-SPECIFIC INFORMATION        For patients on injectable medications: Patient currently has 1 doses left.  Next injection is scheduled for 05/18/2020.    SPECIALTY MEDICATION ADHERENCE     Medication Adherence    Patient reported X missed doses in the last month: 0  Specialty Medication: humira 40mg /0.97ml  Patient is on additional specialty medications: No  Patient is on more than two specialty medications: No  Any gaps in refill history greater than 2 weeks in the last 3 months: no  Demonstrates understanding of importance of adherence: yes  Informant: patient  Reliability of informant: reliable  Confirmed plan for next specialty medication refill: delivery by pharmacy  Refills needed for supportive medications: not needed                    SHIPPING     Shipping address confirmed in Epic.     Delivery Scheduled: Yes, Expected medication delivery date: 05/19/2020.     Medication will be delivered via Same Day Courier to the prescription address in Epic WAM.    Logan Quinn D Logan Quinn   [facility] Specialty Technician

## 2020-05-17 IMAGING — MR MR HEEL *R* W/O CM
5 series · 40 of 40 positions shown · non-contrast
Comparison: Radiographs 03/30/2018

CLINICAL DATA: Bilateral heel pain.

EXAM:
MR OF THE RIGHT HEEL WITHOUT CONTRAST
TECHNIQUE: Multiplanar, multisequence MR imaging of the ankle was performed. No
intravenous contrast was administered.

[Series 5: PD fat-sat · axial · right · 3.0mm · 0.50mm/px · z∈[-64,+76]mm · 9 of 36 slices shown]
[im 1/36]
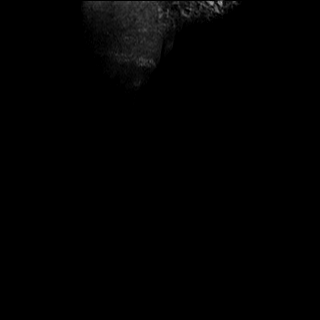
[im 5/36]
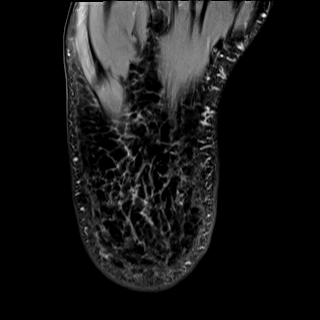
[im 9/36]
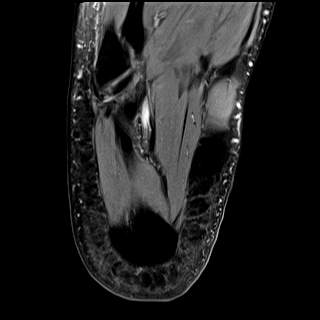
[im 14/36]
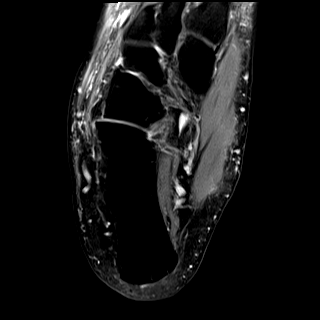
[im 18/36]
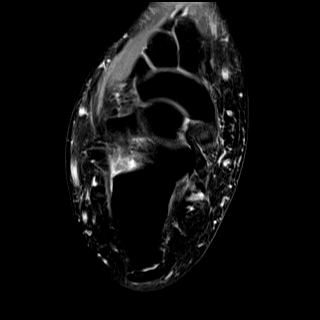
[im 22/36]
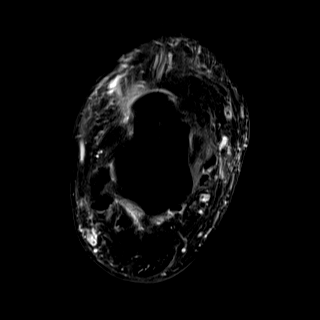
[im 27/36]
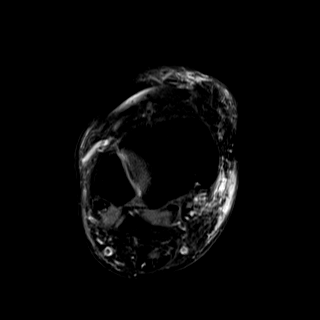
[im 31/36]
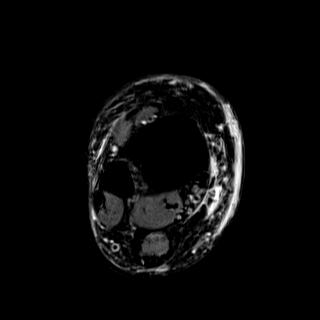
[im 36/36]
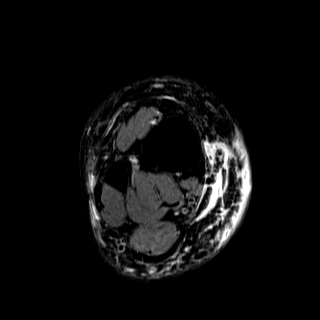

[Series 6: T2 fat-sat · axial · right · 3.0mm · 0.50mm/px · z∈[-64,+76]mm · 9 of 36 slices shown (1 of 2)]
[im 1/36]
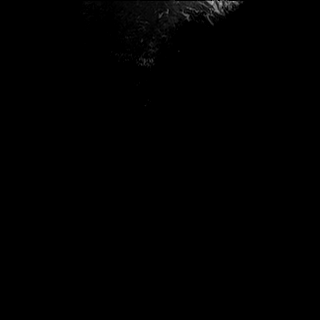
[im 5/36]
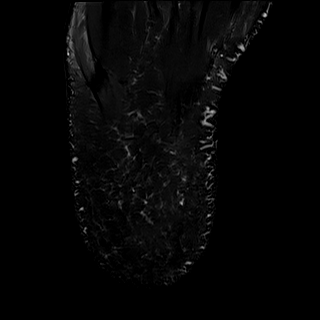
[im 9/36]
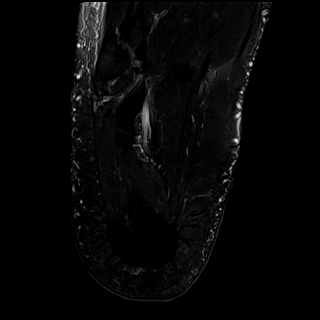
[im 14/36]
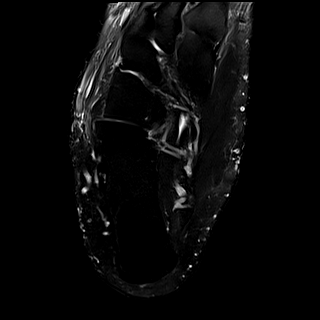
[im 18/36]
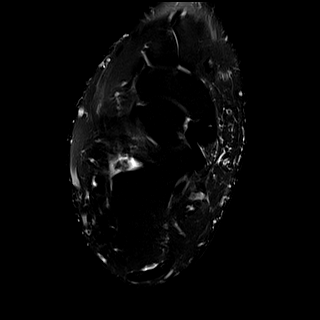
[im 22/36]
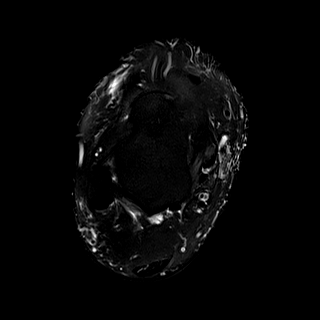
[im 27/36]
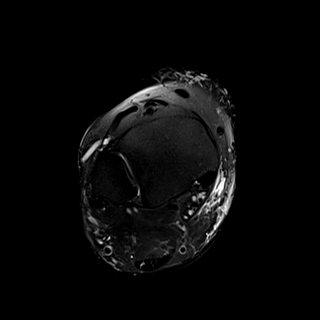
[im 31/36]
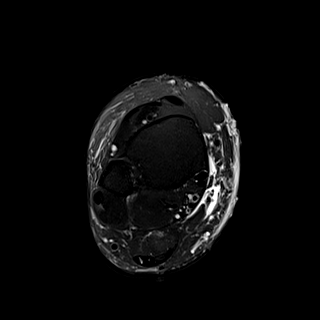
[im 36/36]
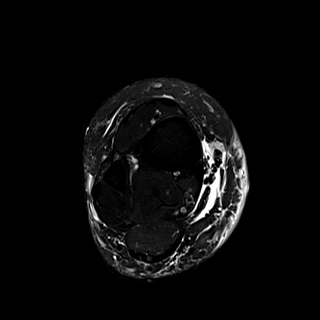

[Series 7: T2 fat-sat · coronal · right · 3.0mm · 0.62mm/px · 10 of 38 slices shown (2 of 2)]
[im 1/38]
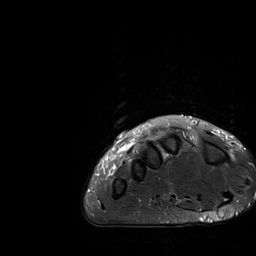
[im 5/38]
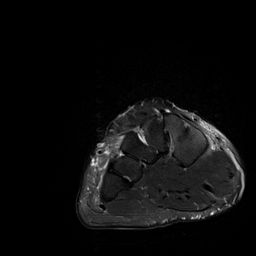
[im 9/38]
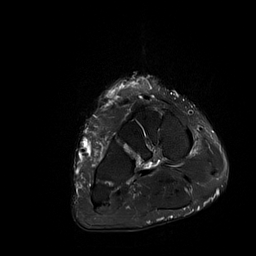
[im 13/38]
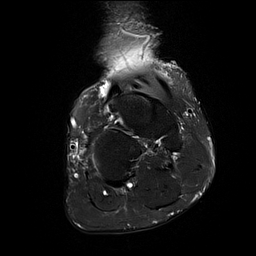
[im 17/38]
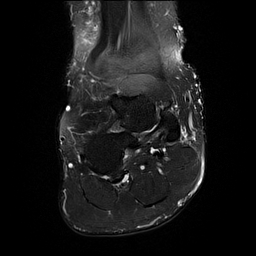
[im 21/38]
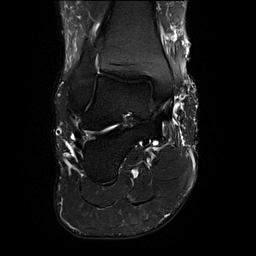
[im 25/38]
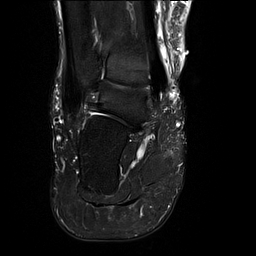
[im 29/38]
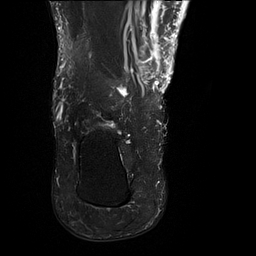
[im 33/38]
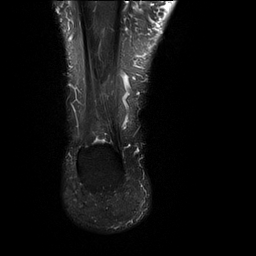
[im 38/38]
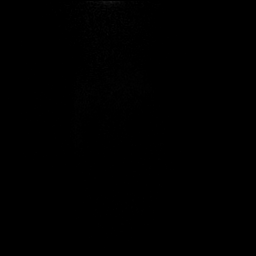

[Series 8: T1 · sagittal · right · 4.0mm · 0.70mm/px · 6 of 23 slices shown]
[im 1/23]
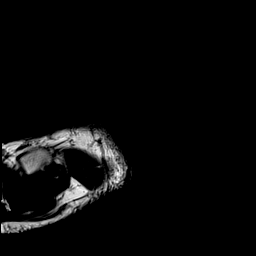
[im 5/23]
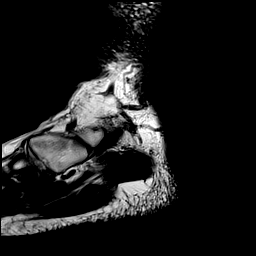
[im 9/23]
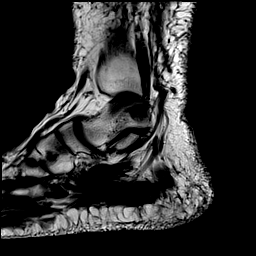
[im 14/23]
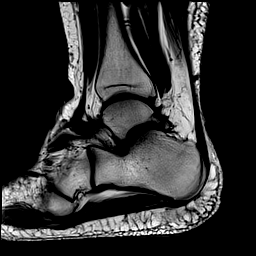
[im 18/23]
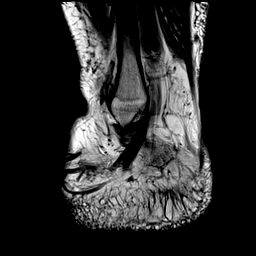
[im 23/23]
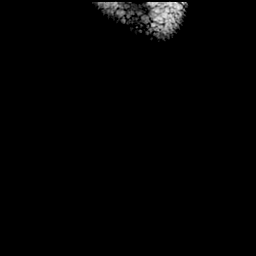

[Series 9: STIR · sagittal · right · 4.0mm · 0.35mm/px · 6 of 23 slices shown]
[im 1/23]
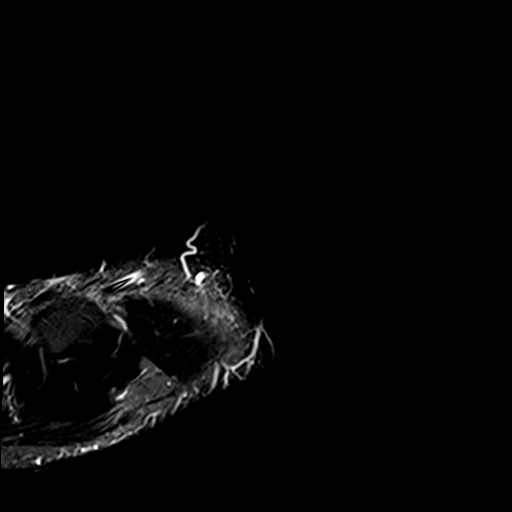
[im 5/23]
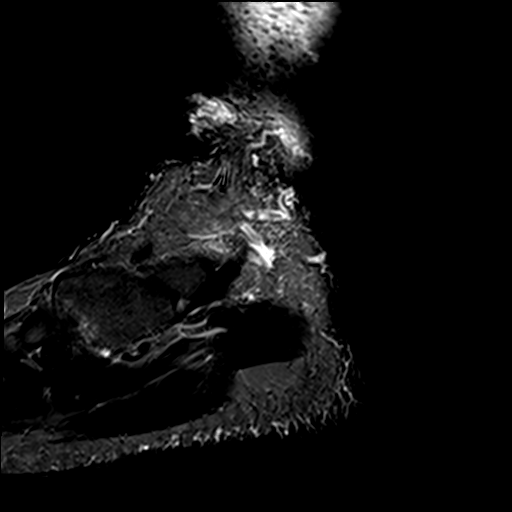
[im 9/23]
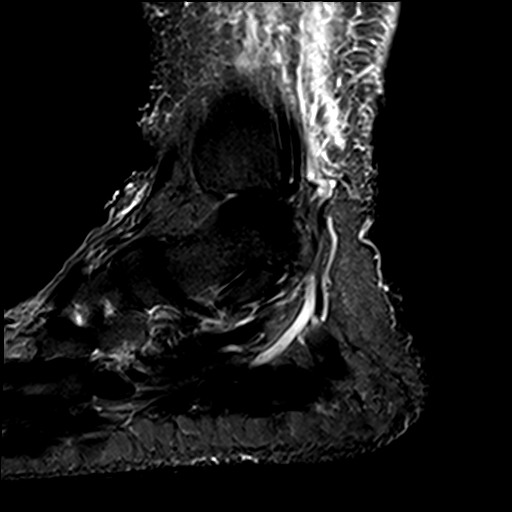
[im 14/23]
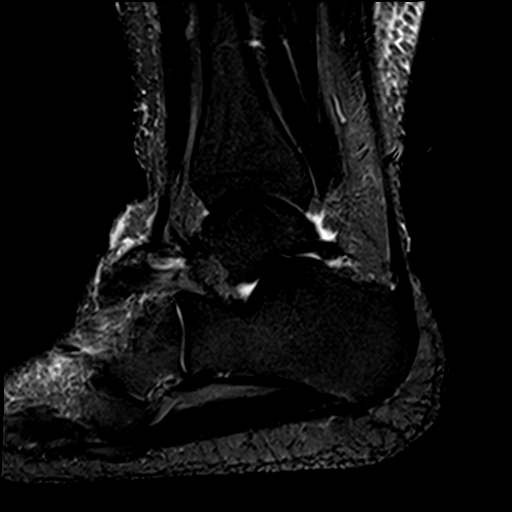
[im 18/23]
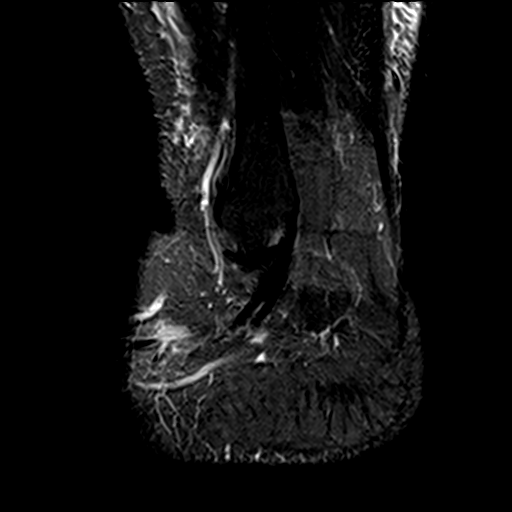
[im 23/23]
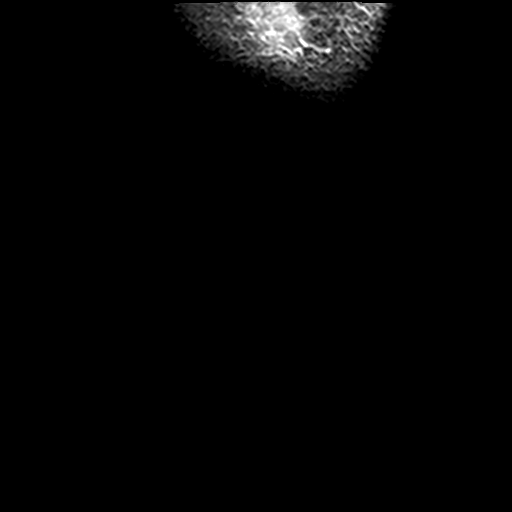

[40 of 40 positions shown; findings below may reference images not displayed]

FINDINGS: TENDONS

Peroneal: Intact

Posteromedial: Intact

Anterior: Intact

Achilles: Mild tendinopathy but no tear/rupture.

Plantar Fascia: Mild focal changes of plantar fasciitis involving
the lateral band with increased T2 signal intensity and associated
mild adjacent marrow edema. No spurring changes. There is also a
small focus of edema like signal abnormality in the flexor digitorum
brevis muscle.

LIGAMENTS

Lateral: Intact

Medial: Intact

CARTILAGE

Ankle Joint: Normal articular cartilage. No chondral defects or
osteochondral lesion. No joint effusion.

Subtalar Joints/Sinus Tarsi: The subtalar joints are maintained. No
joint effusions. Small os trigonum noted. The sinus tarsi is normal.
The cervical and interosseous ligaments are intact. The spring
ligament is intact.

Bones: No acute bony findings. Minimal marrow edema involving the
calcaneus at the site of the lateral band of the plantar fascia
attachment. No stress fractures or osteonecrosis.

Other: Normal foot and ankle musculature.
IMPRESSION: 1. Mild changes of plantar fasciitis mainly involving the lateral
band. No tear/rupture.
2. Mild Achilles tendinopathy.
3. Intact medial and lateral ankle ligaments and tendons.
4. No stress fracture or AVN.

## 2020-05-17 NOTE — Unmapped (Signed)
Message and results noted.

## 2020-05-17 NOTE — Unmapped (Signed)
Report reviewed

## 2020-05-18 MED ORDER — GLIPIZIDE 10 MG TABLET
ORAL_TABLET | Freq: Two times a day (BID) | ORAL | 1 refills | 90 days | Status: CP
Start: 2020-05-18 — End: 2021-05-19

## 2020-05-19 MED FILL — HUMIRA PEN CITRATE FREE 40 MG/0.4 ML: 28 days supply | Qty: 8 | Fill #2 | Status: AC

## 2020-05-19 MED FILL — HUMIRA PEN CITRATE FREE 40 MG/0.4 ML: 28 days supply | Qty: 8 | Fill #2

## 2020-05-22 MED ORDER — LEVOTHYROXINE 50 MCG TABLET
ORAL_TABLET | Freq: Every day | ORAL | 1 refills | 90 days | Status: CP
Start: 2020-05-22 — End: 2021-05-22

## 2020-05-22 NOTE — Unmapped (Signed)
Patient is requesting the following refill  Requested Prescriptions     Pending Prescriptions Disp Refills   ??? levothyroxine (SYNTHROID) 50 MCG tablet 90 tablet 1     Sig: Take 1 tablet (50 mcg total) by mouth daily.       Last OV: 02/21/2020 Visit date not found     Next OV: 05/24/2020.     Labs:   TSH:   TSH (uIU/mL)   Date Value   03/24/2019 2.026

## 2020-05-24 MED ORDER — PANTOPRAZOLE 40 MG TABLET,DELAYED RELEASE
ORAL_TABLET | Freq: Two times a day (BID) | ORAL | 1 refills | 90 days | Status: CP
Start: 2020-05-24 — End: 2021-05-24

## 2020-05-24 NOTE — Unmapped (Signed)
Patient is requesting the following refill  Requested Prescriptions     Pending Prescriptions Disp Refills   ??? pantoprazole (PROTONIX) 40 MG tablet 180 tablet 1     Sig: Take 1 tablet (40 mg total) by mouth Two (2) times a day.       Last OV: 02/21/20  Next OV: 05/24/20

## 2020-05-24 NOTE — Unmapped (Deleted)
Assessment and Plan:     There are no diagnoses linked to this encounter.    I personally spent *** minutes face-to-face and non-face-to-face in the care of this patient, which includes all pre, intra, and post visit time on the date of service.  Over 50% spent in face to face counseling and education regarding ***.    No follow-ups on file.    HPI:      Logan Quinn  is here for No chief complaint on file.    Diabetes: Patient presents {diabetes rfv:14250::for follow up of diabetes.}  A1C goal is <8.  Diabetes has customarily {been/not been:38678} at goal (complicated by: ***).  Current symptoms include: {dm sx:14075}. Symptoms have {symptom progression:19445}. Patient denies {dm sx:19199}. Evaluation to date has included: {dm [phone]}.  Home sugars: {dm home sugars:14018}. Current treatment: {km dm tx:73344} which has been {effective/ineffective:14021}.  Doing regular exercise: {yes no:22180}.     Hypertension: Patient presents for {evaluation/follow-up:17928::follow-up} of hypertension. Blood pressure goal < 140/90.  Hypertension has customarily {been/notbeen:38678} at goal complicated by ***.  Home blood pressure readings: {home bp readings:17448}. Salt intake and diet: {salt intake/diet:17449}. Associated signs and symptoms: {symptoms hypertension:17452}. Patient denies: {symptoms hypertension:19611}. Medication compliance: {med compliance:10573}. He {is/is not:23060} doing regular exercise.      {kerichronicdx:74702}    {keriacutedx:74703}     ROS:      Comprehensive 10 point ROS negative unless otherwise stated in the HPI.      [facility] Components:     Medication adherence and barriers to the treatment plan have been addressed. Opportunities to optimize healthy behaviors have been discussed. Patient / caregiver voiced understanding.    Past Medical/Surgical History:     Past Medical History:   Diagnosis Date   ??? Acne    ??? Allergic    ??? Anxiety    ??? Depression    ??? Diabetes mellitus (CMS-HCC)    ??? GERD (gastroesophageal reflux disease)    ??? Gout    ??? Hypertension    ??? IBS (irritable bowel syndrome)    ??? 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 Liver disease    ??? Migraines    ??? Morbid obesity with BMI of 60.0-69.9, adult (CMS-HCC)    ??? 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 Obstructive sleep apnea    ??? OSA on CPAP    ??? Severe obstructive sleep apnea    ??? Trapezius muscle strain 12/07/2013   ??? Urinary incontinence, nocturnal enuresis    ??? Venous insufficiency      Past Surgical History:   Procedure Laterality Date   ??? EYE SURGERY  11/15   ??? PR COLONOSCOPY FLX DX W/COLLJ SPEC WHEN PFRMD N/A 03/24/2013    Procedure: COLONOSCOPY, FLEXIBLE, PROXIMAL TO SPLENIC FLEXURE; DIAGNOSTIC, W/WO COLLECTION SPECIMEN BY BRUSH OR WASH;  Surgeon: Clint Bolder, MD;  [facility];  Service: Gastroenterology   ??? PR EYE SURG POST SGMT PROC UNLISTED Left     pneumatic retinopexy OS   ??? PR UPPER GI ENDOSCOPY,DIAGNOSIS N/A 02/02/2013    Procedure: UGI ENDO, INCLUDE ESOPHAGUS, STOMACH, & DUODENUM &/OR JEJUNUM; DX W/WO COLLECTION SPECIMN, BY BRUSH OR WASH;  Surgeon: Malcolm Metro, MD;  Location: [facility];  Service: Gastroenterology   ??? [place] PYLORIC STENOSIS ([place] HISTORICAL RESULT)         Family History:     Family History   Problem Relation Age of Onset   ??? Cancer  Maternal Grandfather    ??? Hearing loss Maternal Grandfather    ??? Cancer Paternal Grandfather    ??? COPD Paternal Grandmother    ??? Arthritis Paternal Grandmother    ??? Depression Paternal Grandmother    ??? Diabetes Mother    ??? Heart disease Mother    ??? Migraines Mother    ??? Arthritis Mother    ??? Depression Mother    ??? GER disease Mother    ??? Hypertension Mother    ??? Angina Mother    ??? COPD Mother    ??? Glaucoma Mother    ??? Hearing loss Mother    ??? Diabetes Sister    ??? Migraines Sister    ??? Asthma Sister    ??? Depression Sister    ??? Hearing loss Sister    ??? Diabetes Brother    ??? Asthma Brother    ??? Diabetes Maternal Grandmother    ??? Heart disease Maternal Grandmother    ??? Migraines Maternal Grandmother    ??? Depression Maternal Grandmother    ??? Angina Maternal Grandmother    ??? Hypertension Maternal Grandmother    ??? Stroke Maternal Grandmother    ??? Diabetes Maternal Uncle    ??? Hearing loss Maternal Uncle    ??? Diabetes Maternal Uncle    ??? Liver disease Maternal Uncle    ??? Kidney disease Maternal Uncle    ??? Asthma Brother    ??? Colorectal Cancer Neg Hx    ??? Esophageal cancer Neg Hx    ??? Liver cancer Neg Hx    ??? Pancreatic cancer Neg Hx    ??? Stomach cancer Neg Hx    ??? Amblyopia Neg Hx    ??? Blindness Neg Hx    ??? Retinal detachment Neg Hx    ??? Strabismus Neg Hx    ??? Macular degeneration Neg Hx    ??? Anesthesia problems Neg Hx    ??? Broken bones Neg Hx    ??? Clotting disorder Neg Hx    ??? Collagen disease Neg Hx    ??? Dislocations Neg Hx    ??? Fibromyalgia Neg Hx    ??? Gout Neg Hx    ??? Hemophilia Neg Hx    ??? Osteoporosis Neg Hx    ??? Rheumatologic disease Neg Hx    ??? Scoliosis Neg Hx    ??? Severe sprains Neg Hx    ??? Sickle cell anemia Neg Hx    ??? Spinal Compression Fracture Neg Hx    ??? Melanoma Neg Hx    ??? Basal cell carcinoma Neg Hx    ??? Squamous cell carcinoma Neg Hx    ??? Deep vein thrombosis Neg Hx        Social History:     Social History     Tobacco Use   ??? Smoking status: Never Smoker   ??? Smokeless tobacco: Never Used   Vaping Use   ??? Vaping Use: Never used   Substance Use Topics   ??? Alcohol use: Never     Alcohol/week: 0.0 standard drinks     Comment: rare social   ??? Drug use: Never       Allergies:     Erythromycin, Penicillins, Sulfa (sulfonamide antibiotics), Other, Erythromycin base, and Sulfasalazine    Current Medications:     Current Outpatient Medications   Medication Sig Dispense Refill   ??? acetaminophen (TYLENOL) 500 MG tablet Take 2 tablets (1,000 mg total) by mouth Three (3) times a  day. 30 tablet 0   ??? ADVAIR DISKUS 500-50 mcg/dose diskus Inhale 1 puff 2 (two) times a day. 60 each 11   ??? albuterol 2.5 mg /3 mL (0.083 %) nebulizer solution Inhale 3 mL (2.5 mg total) by nebulization every four (4) hours as needed for wheezing. 180 mL 2   ??? albuterol HFA 90 mcg/actuation inhaler Inhale 2 puffs every six (6) hours as needed for wheezing. 3 Inhaler 1   ??? ALPRAZolam (XANAX) 0.5 MG tablet Take 1 tablet (0.5 mg total) by mouth Three (3) times a day. 90 tablet 1   ??? blood sugar diagnostic Strp by Other route Three (3) times a day before meals. 300 each 1   ??? blood-glucose meter kit Use as directed 1 each 0   ??? cephalexin (KEFLEX) 500 MG capsule Take 1 capsule (500 mg total) by mouth Four (4) times a day. 40 capsule 0   ??? cetirizine (ZYRTEC) 10 MG tablet Take 1 tablet (10 mg total) by mouth nightly as needed for allergies. 90 tablet 1   ??? chlorhexidine (PERIDEX) 0.12 % solution 15 mL by Mouth route Two (2) times a day. 437 mL 0   ??? clobetasoL (TEMOVATE) 0.05 % ointment Apply daily to painful affected areas if needed for flares, then stop 15 g 1   ??? cyclobenzaprine (FLEXERIL) 10 MG tablet Take 1 tablet (10 mg total) by mouth Three (3) times a day as needed for muscle spasms. 60 tablet 0   ??? dextroamphetamine-amphetamine (ADDERALL) 20 mg tablet Take 1 tablet by mouth Two (2) times a day. 30 tablet 0   ??? dicyclomine (BENTYL) 10 mg capsule Take 1 capsule (10 mg total) by mouth Three (3) times a day. 270 capsule 1   ??? diphenoxylate-atropine (LOMOTIL) 2.5-0.025 mg per tablet Take 1 tablet by mouth two (2) times a day as needed for diarrhea. 30 tablet 5   ??? DULoxetine (CYMBALTA) 60 MG capsule Take 1 capsule (60 mg total) by mouth Two (2) times a day. 180 capsule 0   ??? empty container (SHARPS CONTAINER) Misc Use as directed to dispose of Humira needles 1 each 2   ??? empty container Misc Use as directed to dispose of Humira injections 1 each 2   ??? erenumab-aooe 140 mg/mL AtIn Inject 140 mg under the skin every twenty-eight (28) days. 1 mL 6   ??? famotidine (PEPCID) 20 MG tablet Take 1 tablet (20 mg total) by mouth Two (2) times a day. 180 tablet 2   ??? fluticasone propionate (FLONASE) 50 mcg/actuation nasal spray 1 spray into each nostril Two (2) times a day. 16 g 5   ??? gabapentin (NEURONTIN) 300 MG capsule Take 300 mg by mouth Three (3) times a day.     ??? glipiZIDE (GLUCOTROL) 10 MG tablet Take 2 tablets (20 mg total) by mouth Two (2) times a day (30 minutes before a meal). 360 tablet 1   ??? HUMIRA PEN CITRATE FREE 40 MG/0.4 ML Inject the contents of 2 pens (80mg  total) under the skin every seven (7) days. 8 each 3   ??? hydrocortisone (PROCTOSOL HC) 2.5 % rectal cream Insert 1 application into the rectum two (2) times a day as needed. 28.35 g 2   ??? hydrOXYzine (ATARAX) 25 MG tablet TAKE 1/2 TO 4 TABLETS BY MOUTH UP TO THREE TIMES DAILY AS NEEDED FOR ANXIETY     ??? ibuprofen (ADVIL,MOTRIN) 800 MG tablet Take 1 tablet (800 mg total) by mouth every eight (8) hours  as needed. 90 tablet 1   ??? insulin pump cartridge Crtg Inject 120 Units under the skin daily. 5 each 5   ??? insulin syringe-needle U-100 (BD INSULIN SYRINGE ULTRA-FINE) 1 mL 31 gauge x 5/16 (8 mm) Syrg Use up to 5 x daily to administer insulin. 100 each 1   ??? insulin syringe-needle U-100 (ULTICARE) 1 mL 31 gauge x 5/16 (8 mm) Syrg Use up to 5 times a day to administer insulin 100 each 0   ??? ketoconazole (NIZORAL) 2 % cream Apply 1 application topically daily. 15 g 5   ??? lancets Misc Type 2 diabetes, poorly controlled 250.02   Test 2 times daily 100 each 11   ??? levothyroxine (SYNTHROID) 50 MCG tablet Take 1 tablet (50 mcg total) by mouth daily. 90 tablet 1   ??? lidocaine (XYLOCAINE) 5 % ointment lidocaine 5 % topical ointment     ??? miscellaneous medical supply Misc Gauze pads 4 x 4 inches and paper tape 30 each 0   ??? montelukast (SINGULAIR) 10 mg tablet Take 1 tablet (10 mg total) by mouth daily. 90 each 1   ??? nystatin (MYCOSTATIN) 100,000 unit/gram powder Apply to affected area 3 times daily 30 g 1   ??? oxybutynin (DITROPAN) 5 MG tablet Take 1 tablet (5 mg total) by mouth Two (2) times a day. 180 tablet 1   ??? pantoprazole (PROTONIX) 40 MG tablet Take 1 tablet (40 mg total) by mouth Two (2) times a day. 180 tablet 1   ??? polyethylene glycol (GLYCOLAX) 17 gram/dose powder take 17GM (DISSOLVED IN WATER) by mouth once daily     ??? PROAIR HFA 90 mcg/actuation inhaler Inhale 2 puffs every four (4) hours as needed for wheezing. 3 Inhaler 1   ??? propranoloL (INDERAL LA) 120 mg 24 hr capsule Take 1 capsule (120 mg total) by mouth daily. 90 capsule 1   ??? sour cherry extract (TART CHERRY EXTRACT) 1,000 mg cap      ??? topiramate (TOPAMAX) 50 MG tablet Take 1.5 tablets (75 mg total) by mouth Two (2) times a day. 270 tablet 1   ??? traZODone (DESYREL) 100 MG tablet Take 2 tablets (200 mg total) by mouth nightly. 180 tablet 0     No current facility-administered medications for this visit.       Health Maintenance:     Health Maintenance   Topic Date Due   ??? COPD Spirometry  Never done   ??? COVID-19 Vaccine (1) Never done   ??? Influenza Vaccine (1) 02/09/2020   ??? Potassium Monitoring  04/24/2020   ??? Hemoglobin A1c  05/25/2020   ??? Retinal Eye Exam  06/09/2020   ??? Urine Albumin/Creatinine Ratio  09/16/2020   ??? Serum Creatinine Monitoring  09/16/2020   ??? Foot Exam  11/17/2020   ??? DTaP/Tdap/Td Vaccines (4 - Td or Tdap) 07/17/2023   ??? Hepatitis C Screen  Completed   ??? Pneumococcal Vaccine  Completed       Immunizations:     Immunization History   Administered Date(s) Administered   ??? Hepatitis B Vaccine, Unspecified Formulation 12/27/1999, 04/29/2000   ??? Hepatitis B, Adult 03/05/2013, 04/05/2013, 09/03/2013   ??? INFLUENZA INJ MDCK PF, QUAD,(FLUCELVAX)(49MO AND UP EGG FREE) 03/31/2019   ??? INFLUENZA TIV (TRI) PF (IM) 10/08/2011   ??? Influenza Vaccine Quad (IIV4 PF) 49mo+ injectable 03/05/2013, 03/18/2014, 03/22/2015, 03/05/2016, 03/19/2017, 02/23/2018   ??? Influenza Virus Vaccine, unspecified formulation 03/19/2017, 02/23/2018, 03/25/2019   ??? PNEUMOCOCCAL POLYSACCHARIDE 23 12/30/2012   ???  PPD Test 10/28/2016   ??? TdaP 07/18/2008, 07/16/2013   ??? Tetanus and diptheria,(adult), adsorbed, 2Lf tetanus toxoid, PF 04/29/2000     I have reviewed and (if needed) updated the patient's problem list, medications, allergies, past medical and surgical history, social and family history.     Vital Signs:     Wt Readings from Last 3 Encounters:   02/21/20 (!) 232.2 kg (512 lb)   11/18/19 (!) 233.1 kg (514 lb)   09/29/19 (!) 228.2 kg (503 lb)     Temp Readings from Last 3 Encounters:   02/21/20 36.7 ??C (98.1 ??F) (Oral)   11/18/19 36.6 ??C (97.9 ??F) (Oral)   09/29/19 36.6 ??C (97.8 ??F) (Oral)     BP Readings from Last 3 Encounters:   02/21/20 140/70   11/18/19 128/68   09/29/19 124/66     Pulse Readings from Last 3 Encounters:   02/21/20 74   11/18/19 55   09/29/19 61     Estimated body mass index is 71.44 kg/m?? as calculated from the following:    Height as of 11/18/19: 180.3 cm (5' 10.98).    Weight as of 02/21/20: 232.2 kg (512 lb).  No height and weight on file for this encounter.      Objective:      General: Alert and oriented x3. Well-appearing. No acute distress. ***  HEENT:  Normocephalic.  Atraumatic. Conjunctiva and sclera normal. OP MMM without lesions. ***  Neck:  Supple. No thyroid enlargement. No adenopathy. ***  Heart:  Regular rate and rhythm . Normal S1, S2.  No murmurs, rubs or gallops. ***  Lungs:  No respiratory distress.  Lungs clear to auscultation. No wheezes, rhonchi, or rales. ***  GI/GU:  Soft, +BS, nondistended, non-TTP. No palpable masses or organomegaly. ***  Extremities:  No edema. Peripheral pulses normal. ***  Skin:  Warm, dry. No rash or lesions present. ***  Neuro:  Non-focal. No obvious weakness. ***  Psych:  Affect normal, eye contact good, speech clear and coherent. ***     I attest that I, Bayard Hugger, personally documented this note while acting as scribe for [name], FNP.      Bayard Hugger, Scribe.  05/24/2020     The documentation recorded by the scribe accurately reflects the service I personally performed and the decisions made by me.    [name], FNP

## 2020-05-25 ENCOUNTER — Encounter: Admit: 2020-05-25 | Discharge: 2020-05-26 | Payer: MEDICAID

## 2020-05-25 DIAGNOSIS — J988 Other specified respiratory disorders: Principal | ICD-10-CM

## 2020-05-25 DIAGNOSIS — J42 Unspecified chronic bronchitis: Principal | ICD-10-CM

## 2020-06-04 MED ORDER — COLCHICINE 0.6 MG CAPSULE
ORAL_CAPSULE | 3 refills | 0 days | Status: CP
Start: 2020-06-04 — End: 2021-06-04

## 2020-06-13 DIAGNOSIS — K591 Functional diarrhea: Principal | ICD-10-CM

## 2020-06-13 MED ORDER — DICYCLOMINE 10 MG CAPSULE
ORAL_CAPSULE | Freq: Three times a day (TID) | ORAL | 1 refills | 90.00000 days | Status: CP
Start: 2020-06-13 — End: 2021-06-13

## 2020-06-13 MED ORDER — PROPRANOLOL ER 120 MG CAPSULE,24 HR,EXTENDED RELEASE
ORAL_CAPSULE | Freq: Every day | ORAL | 1 refills | 90 days | Status: CP
Start: 2020-06-13 — End: 2021-06-13

## 2020-06-13 NOTE — Unmapped (Signed)
Interpretation  PATIENT USED ADVAIR THIS MORNING PRIOR TO TESTING.  GOOD PATIENT EFFORT ON ALL TESTS.  SPIROMETRY DATA ARE ACCEPTABLE AND REPEATABLE.  Normal FEV1/FVC with reduced FVC is suggestive of mild restrictive ventilatory defect. Recommend lung volumes to fully evaluate  restrictive ventilatory defect. Inspiratory limb of Flow-volume loop demonstrates normal forced inspiratory flows. Of note, patient used  Advair this morning prior to testing.  (Physician 05/26/2020 02:46PM, Electronically signed by Levi Aland. Dover, MD / Final: 05/26/2020 02:46PM, Electronically signed by Levi Aland. Dover,  MD)

## 2020-06-14 NOTE — Unmapped (Signed)
Patient is requesting the following refill  Requested Prescriptions     Pending Prescriptions Disp Refills   ??? propranoloL (INDERAL LA) 120 mg 24 hr capsule 90 capsule 1     Sig: Take 1 capsule (120 mg total) by mouth daily.   ??? dicyclomine (BENTYL) 10 mg capsule 270 capsule 1     Sig: Take 1 capsule (10 mg total) by mouth Three (3) times a day.       Last OV: 02/21/2020     Last Virtual Visit: Visit date not found     Next OV: 07/04/20.

## 2020-06-14 NOTE — Unmapped (Signed)
Called and spoke to patient who states that he currently has 6 doses of Humira on hand. He says he knows he missed 2 doses but does not know how he ended up with so much. Rescheduling call.

## 2020-06-15 ENCOUNTER — Ambulatory Visit: Payer: Medicare Other | Admitting: Podiatry

## 2020-06-26 NOTE — Unmapped (Signed)
Average readings too hight. Will discuss management plan at upcoming clinic appointment 07/04/20.

## 2020-06-28 MED ORDER — HUMALOG U-100 INSULIN 100 UNIT/ML SUBCUTANEOUS SOLUTION
0 days
Start: 2020-06-28 — End: ?

## 2020-07-01 MED ORDER — DEXTROAMPHETAMINE-AMPHETAMINE 20 MG TABLET
ORAL_TABLET | Freq: Two times a day (BID) | ORAL | 0 refills | 15 days
Start: 2020-07-01 — End: ?

## 2020-07-01 MED ORDER — CYCLOBENZAPRINE 10 MG TABLET
ORAL_TABLET | Freq: Three times a day (TID) | ORAL | 0 refills | 20 days | PRN
Start: 2020-07-01 — End: 2021-07-01

## 2020-07-02 MED ORDER — HYDROXYZINE HCL 25 MG TABLET
0 refills | 0 days
Start: 2020-07-02 — End: ?

## 2020-07-02 NOTE — Unmapped (Signed)
Patient is requesting the following refill  Requested Prescriptions     Pending Prescriptions Disp Refills   ??? hydrOXYzine (ATARAX) 25 MG tablet  0       Last OV: 02/21/20    Last Virtual Visit: Visit date not found     Next OV: 07/04/20

## 2020-07-02 NOTE — Unmapped (Signed)
Patient is requesting the following refill  Requested Prescriptions     Pending Prescriptions Disp Refills   ??? dextroamphetamine-amphetamine (ADDERALL) 20 mg tablet 30 tablet 0     Sig: Take 1 tablet by mouth Two (2) times a day.   ??? cyclobenzaprine (FLEXERIL) 10 MG tablet 60 tablet 0     Sig: Take 1 tablet (10 mg total) by mouth Three (3) times a day as needed for muscle spasms.       Last OV: 02/21/2020     Last Virtual Visit: Visit date not found     Next OV: 07/04/2020    Opioid Monitoring   Urine Tox ScreenLast Drug Screen Date: Not Found  Opiate Confirmation Test Last Drug Screen Date: Not Found  Last Opioid Dispensed Provider: Jamas Lav, MD  Prescribed MEDD : 0  Last PDMP Review: 06/16/2019  3:25 PM  Last Pain Agreement Signed Date: Not Found         Naloxone Ordered: Not Found

## 2020-07-03 NOTE — Unmapped (Signed)
Assessment and Plan:     Logan Quinn was seen today for diabetes.    Diagnoses and all orders for this visit:    Essential hypertension  BP at goal (130/80 in clinic today). Continue propranolol 120 mg daily. Reviewed low sodium diet and encouraged regular exercise. Advised to continue to monitor and log at-home BP readings.  -     Comprehensive Metabolic Panel      Type 2 diabetes mellitus with hyperglycemia, with long-term current use of insulin (CMS-HCC)  HGB A1c 8.9 (down from 9.4 four months ago).   DM not at goal. Continue glipizide 20 mg BID, use of OmniPod.  Encouraged patient to continue carb controlled diet and regular exercise.  -     Cancel: Basic metabolic panel  -     Comprehensive Metabolic Panel    Reviewed and discussed recent PFT results in detail with patient.     Return in about 3 months (around 10/02/2020) for Next scheduled follow up.    HPI:      Logan Quinn  is here for   Chief Complaint   Patient presents with   ??? Diabetes     Diabetes: Patient presents for follow up of diabetes.  A1C goal is <8.  Diabetes has customarily not been goal (complicated by: obesity, lack of exercise, poor dietary choices).  Current symptoms include: none. Symptoms have been well-controlled. Patient denies foot ulcerations, hyperglycemia, hypoglycemia , increase appetite, nausea, paresthesia of the feet, polydipsia, polyuria, visual disturbances, vomitting and weight loss. Evaluation to date has included: hemoglobin A1C.  Home sugars: avg 227, per CGM report from last 3 months. Current treatment: Continued glipizide and Omnipod with Humalog which has been somewhat effective. Patient reports he was using OmniPod incorrectly until ~1 month ago. He was dosing insulin after eating, rather than before as directed. He reports BGs have been gradually improving since using OmniPod correctly. Doing regular exercise: no.   He reports he is needing to change pods every 2 days.   He missed last eye exam, vocalizes intent to reschedule.    Hypertension: Patient presents for follow-up of hypertension. Blood pressure goal < 140/90.  Hypertension has customarily been at goal complicated by DM, obesity, lack of exercise, poor dietary choices.  Home blood pressure readings: did not bring log. Salt intake and diet: salt not added to cooking and salt shaker not on table. Associated signs and symptoms: none. Patient denies: blurred vision, chest pain, dyspnea, headache, neck aches, orthopnea, palpitations, paroxysmal nocturnal dyspnea, peripheral edema, pulsating in the ears and tiredness/fatigue. Medication compliance: taking as prescribed. He is not doing regular exercise.      Patient is following with dermatology for HS. Current treatment: Humira, clindamycin lotion. Specialist ordered labs - patient requests to be drawn today.     He requests refill of Adderall. He is in the process of establishing with new mental health provider, but reports difficulty finding medication management provider and therapist within same the entity that are covered by his new insurance.         ROS:      Comprehensive 10 point ROS negative unless otherwise stated in the HPI.      [facility] Components:     Medication adherence and barriers to the treatment plan have been addressed. Opportunities to optimize healthy behaviors have been discussed. Patient / caregiver voiced understanding.    Past Medical/Surgical History:     Past Medical History:   Diagnosis Date   ??? Acne    ???  Allergic    ??? Anxiety    ??? Depression    ??? Diabetes mellitus (CMS-HCC)    ??? GERD (gastroesophageal reflux disease)    ??? Gout    ??? Hypertension    ??? IBS (irritable bowel syndrome)    ??? 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 Liver disease    ??? Migraines    ??? Morbid obesity with BMI of 60.0-69.9, adult (CMS-HCC)    ??? 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 Obstructive sleep apnea    ??? OSA on CPAP    ??? Severe obstructive sleep apnea    ??? Trapezius muscle strain 12/07/2013   ??? Urinary incontinence, nocturnal enuresis    ??? Venous insufficiency      Past Surgical History:   Procedure Laterality Date   ??? EYE SURGERY  11/15   ??? PR COLONOSCOPY FLX DX W/COLLJ SPEC WHEN PFRMD N/A 03/24/2013    Procedure: COLONOSCOPY, FLEXIBLE, PROXIMAL TO SPLENIC FLEXURE; DIAGNOSTIC, W/WO COLLECTION SPECIMEN BY BRUSH OR WASH;  Surgeon: Clint Bolder, MD;  [facility];  Service: Gastroenterology   ??? PR EYE SURG POST SGMT PROC UNLISTED Left     pneumatic retinopexy OS   ??? PR UPPER GI ENDOSCOPY,DIAGNOSIS N/A 02/02/2013    Procedure: UGI ENDO, INCLUDE ESOPHAGUS, STOMACH, & DUODENUM &/OR JEJUNUM; DX W/WO COLLECTION SPECIMN, BY BRUSH OR WASH;  Surgeon: Malcolm Metro, MD;  [facility];  Service: Gastroenterology   ??? [place] PYLORIC STENOSIS ([place] HISTORICAL RESULT)         Family History:     Family History   Problem Relation Age of Onset   ??? Cancer Maternal Grandfather    ??? Hearing loss Maternal Grandfather    ??? Cancer Paternal Grandfather    ??? COPD Paternal Grandmother    ??? Arthritis Paternal Grandmother    ??? Depression Paternal Grandmother    ??? Diabetes Mother    ??? Heart disease Mother    ??? Migraines Mother    ??? Arthritis Mother    ??? Depression Mother    ??? GER disease Mother    ??? Hypertension Mother    ??? Angina Mother    ??? COPD Mother    ??? Glaucoma Mother    ??? Hearing loss Mother    ??? Diabetes Sister    ??? Migraines Sister    ??? Asthma Sister    ??? Depression Sister    ??? Hearing loss Sister    ??? Diabetes Brother    ??? Asthma Brother    ??? Diabetes Maternal Grandmother    ??? Heart disease Maternal Grandmother    ??? Migraines Maternal Grandmother    ??? Depression Maternal Grandmother    ??? Angina Maternal Grandmother    ??? Hypertension Maternal Grandmother    ??? Stroke Maternal Grandmother    ??? Diabetes Maternal Uncle    ??? Hearing loss Maternal Uncle    ??? Diabetes Maternal Uncle    ??? Liver disease Maternal Uncle    ??? Kidney disease Maternal Uncle    ??? Asthma Brother    ??? Colorectal Cancer Neg Hx    ??? Esophageal cancer Neg Hx    ??? Liver cancer Neg Hx    ??? Pancreatic cancer Neg Hx    ??? Stomach cancer Neg Hx    ??? Amblyopia Neg Hx    ??? Blindness Neg Hx    ??? Retinal detachment Neg Hx    ???  Strabismus Neg Hx    ??? Macular degeneration Neg Hx    ??? Anesthesia problems Neg Hx    ??? Broken bones Neg Hx    ??? Clotting disorder Neg Hx    ??? Collagen disease Neg Hx    ??? Dislocations Neg Hx    ??? Fibromyalgia Neg Hx    ??? Gout Neg Hx    ??? Hemophilia Neg Hx    ??? Osteoporosis Neg Hx    ??? Rheumatologic disease Neg Hx    ??? Scoliosis Neg Hx    ??? Severe sprains Neg Hx    ??? Sickle cell anemia Neg Hx    ??? Spinal Compression Fracture Neg Hx    ??? Melanoma Neg Hx    ??? Basal cell carcinoma Neg Hx    ??? Squamous cell carcinoma Neg Hx    ??? Deep vein thrombosis Neg Hx        Social History:     Social History     Tobacco Use   ??? Smoking status: Never Smoker   ??? Smokeless tobacco: Never Used   Vaping Use   ??? Vaping Use: Never used   Substance Use Topics   ??? Alcohol use: Never     Alcohol/week: 0.0 standard drinks     Comment: rare social   ??? Drug use: Never       Allergies:     Erythromycin, Penicillins, Sulfa (sulfonamide antibiotics), Other, Erythromycin base, and Sulfasalazine    Current Medications:     Current Outpatient Medications   Medication Sig Dispense Refill   ??? ADVAIR DISKUS 500-50 mcg/dose diskus Inhale 1 puff 2 (two) times a day. 60 each 11   ??? albuterol 2.5 mg /3 mL (0.083 %) nebulizer solution Inhale 3 mL (2.5 mg total) by nebulization every four (4) hours as needed for wheezing. 180 mL 2   ??? albuterol HFA 90 mcg/actuation inhaler Inhale 2 puffs every six (6) hours as needed for wheezing. 3 Inhaler 1   ??? ALPRAZolam (XANAX) 0.5 MG tablet Take 1 tablet (0.5 mg total) by mouth Three (3) times a day. 90 tablet 1   ??? blood sugar diagnostic Strp by Other route Three (3) times a day before meals. 300 each 1   ??? blood-glucose meter kit Use as directed 1 each 0   ??? cetirizine (ZYRTEC) 10 MG tablet Take 1 tablet (10 mg total) by mouth nightly as needed for allergies. 90 tablet 1   ??? chlorhexidine (PERIDEX) 0.12 % solution 15 mL by Mouth route Two (2) times a day. 437 mL 0   ??? clindamycin (CLEOCIN T) 1 % lotion APPLY TOPICALLY AT NIGHT TO INFLAMED AREAS AS NEEDED FOR FLARES     ??? clobetasoL (TEMOVATE) 0.05 % ointment Apply daily to painful affected areas if needed for flares, then stop 15 g 1   ??? colchicine (MITIGARE) 0.6 mg cap capsule Take 1.2 mg on Day 1 of flare, followed by 0.6 mg one hour later. Day 2 take 0.6 mg BID until flare resolves. 60 capsule 3   ??? dicyclomine (BENTYL) 10 mg capsule Take 1 capsule (10 mg total) by mouth Three (3) times a day. 270 capsule 1   ??? diphenoxylate-atropine (LOMOTIL) 2.5-0.025 mg per tablet Take 1 tablet by mouth two (2) times a day as needed for diarrhea. 30 tablet 5   ??? DULoxetine (CYMBALTA) 60 MG capsule Take 1 capsule (60 mg total) by mouth Two (2) times a day. 180 capsule 0   ???  empty container (SHARPS CONTAINER) Misc Use as directed to dispose of Humira needles 1 each 2   ??? empty container Misc Use as directed to dispose of Humira injections 1 each 2   ??? erenumab-aooe 140 mg/mL AtIn Inject 140 mg under the skin every twenty-eight (28) days. 1 mL 6   ??? famotidine (PEPCID) 20 MG tablet Take 1 tablet (20 mg total) by mouth Two (2) times a day. 180 tablet 2   ??? fluticasone propionate (FLONASE) 50 mcg/actuation nasal spray 1 spray into each nostril Two (2) times a day. 16 g 5   ??? gabapentin (NEURONTIN) 300 MG capsule Take 300 mg by mouth Three (3) times a day.     ??? glipiZIDE (GLUCOTROL) 10 MG tablet Take 2 tablets (20 mg total) by mouth Two (2) times a day (30 minutes before a meal). 360 tablet 1   ??? HUMALOG U-100 INSULIN 100 unit/mL injection INJECT 120 UNITS UNDER THE SKIN DAILY VIA OMNIPOD]     ??? HUMIRA PEN CITRATE FREE 40 MG/0.4 ML Inject the contents of 2 pens (80mg  total) under the skin every seven (7) days. 8 each 3   ??? hydrocortisone (PROCTOSOL HC) 2.5 % rectal cream Insert 1 application into the rectum two (2) times a day as needed. 28.35 g 2   ??? hydrOXYzine (ATARAX) 25 MG tablet Take 1 tablet (25 mg total) by mouth Three (3) times a day as needed. 120 tablet 1   ??? ibuprofen (ADVIL,MOTRIN) 800 MG tablet Take 1 tablet (800 mg total) by mouth every eight (8) hours as needed. 90 tablet 1   ??? insulin pump cartridge Crtg Inject 120 Units under the skin daily. 5 each 5   ??? ketoconazole (NIZORAL) 2 % cream Apply 1 application topically daily. 15 g 5   ??? lancets Misc Type 2 diabetes, poorly controlled 250.02   Test 2 times daily 100 each 11   ??? levothyroxine (SYNTHROID) 50 MCG tablet Take 1 tablet (50 mcg total) by mouth daily. 90 tablet 1   ??? lidocaine (XYLOCAINE) 5 % ointment lidocaine 5 % topical ointment     ??? montelukast (SINGULAIR) 10 mg tablet Take 1 tablet (10 mg total) by mouth daily. 90 each 1   ??? nystatin (MYCOSTATIN) 100,000 unit/gram powder Apply to affected area 3 times daily 30 g 1   ??? oxybutynin (DITROPAN) 5 MG tablet Take 1 tablet (5 mg total) by mouth Two (2) times a day. 180 tablet 1   ??? pantoprazole (PROTONIX) 40 MG tablet Take 1 tablet (40 mg total) by mouth Two (2) times a day. 180 tablet 1   ??? polyethylene glycol (GLYCOLAX) 17 gram/dose powder take 17GM (DISSOLVED IN WATER) by mouth once daily     ??? PROAIR HFA 90 mcg/actuation inhaler Inhale 2 puffs every four (4) hours as needed for wheezing. 3 Inhaler 1   ??? propranoloL (INDERAL LA) 120 mg 24 hr capsule Take 1 capsule (120 mg total) by mouth daily. 90 capsule 1   ??? sour cherry extract (TART CHERRY EXTRACT) 1,000 mg cap      ??? topiramate (TOPAMAX) 50 MG tablet Take 1.5 tablets (75 mg total) by mouth Two (2) times a day. 270 tablet 1   ??? traZODone (DESYREL) 100 MG tablet Take 2 tablets (200 mg total) by mouth nightly. 180 tablet 0   ??? acetaminophen (TYLENOL) 500 MG tablet Take 2 tablets (1,000 mg total) by mouth Three (3) times a day. (Patient not taking: Reported on 07/04/2020) 30  tablet 0   ??? cyclobenzaprine (FLEXERIL) 10 MG tablet Take 1 tablet (10 mg total) by mouth Three (3) times a day as needed for muscle spasms. 60 tablet 0   ??? dextroamphetamine-amphetamine (ADDERALL) 20 mg tablet Take 1 tablet by mouth Two (2) times a day. 30 tablet 0     No current facility-administered medications for this visit.       Health Maintenance:     Health Maintenance   Topic Date Due   ??? COVID-19 Vaccine (1) Never done   ??? Potassium Monitoring  04/24/2020   ??? Retinal Eye Exam  06/09/2020   ??? Urine Albumin/Creatinine Ratio  09/16/2020   ??? Serum Creatinine Monitoring  09/16/2020   ??? Hemoglobin A1c  10/02/2020   ??? Foot Exam  11/17/2020   ??? DTaP/Tdap/Td Vaccines (4 - Td or Tdap) 07/17/2023   ??? COPD Spirometry  05/25/2025   ??? Hepatitis C Screen  Completed   ??? Influenza Vaccine  Completed   ??? Pneumococcal Vaccine  Completed       Immunizations:     Immunization History   Administered Date(s) Administered   ??? Hepatitis B Vaccine, Unspecified Formulation 12/27/1999, 04/29/2000   ??? Hepatitis B, Adult 03/05/2013, 04/05/2013, 09/03/2013   ??? INFLUENZA INJ MDCK PF, QUAD,(FLUCELVAX)(81MO AND UP EGG FREE) 03/31/2019   ??? INFLUENZA TIV (TRI) PF (IM) 10/08/2011   ??? Influenza Vaccine Quad (IIV4 PF) 74mo+ injectable 03/05/2013, 03/18/2014, 03/22/2015, 03/05/2016, 03/19/2017, 02/23/2018   ??? Influenza Virus Vaccine, unspecified formulation 03/19/2017, 02/23/2018, 03/25/2019, 03/10/2020   ??? PNEUMOCOCCAL POLYSACCHARIDE 23 12/30/2012   ??? PPD Test 10/28/2016   ??? TdaP 07/18/2008, 07/16/2013   ??? Tetanus and diptheria,(adult), adsorbed, 2Lf tetanus toxoid, PF 04/29/2000     I have reviewed and (if needed) updated the patient's problem list, medications, allergies, past medical and surgical history, social and family history.     Vital Signs:     Wt Readings from Last 3 Encounters:   07/04/20 (!) 230.9 kg (509 lb)   02/21/20 (!) 232.2 kg (512 lb)   11/18/19 (!) 233.1 kg (514 lb)     Temp Readings from Last 3 Encounters:   07/04/20 36.8 ??C (98.2 ??F) (Oral)   02/21/20 36.7 ??C (98.1 ??F) (Oral) 11/18/19 36.6 ??C (97.9 ??F) (Oral)     BP Readings from Last 3 Encounters:   07/04/20 130/80   02/21/20 140/70   11/18/19 128/68     Pulse Readings from Last 3 Encounters:   07/04/20 77   02/21/20 74   11/18/19 55     Estimated body mass index is 70.99 kg/m?? as calculated from the following:    Height as of this encounter: 180.3 cm (5' 11).    Weight as of this encounter: 230.9 kg (509 lb).  Facility age limit for growth percentiles is 20 years.      Objective:      General: Alert and oriented x3. Well-appearing. No acute distress. Morbidly obese.  HEENT:  Normocephalic.  Atraumatic. Conjunctiva and sclera normal. OP MMM without lesions.   Neck:  Supple. No thyroid enlargement. No adenopathy.   Heart:  Regular rate and rhythm . Normal S1, S2.  No murmurs, rubs or gallops.   Lungs:  No respiratory distress.  Lungs clear to auscultation. No wheezes, rhonchi, or rales.   GI/GU:  Soft, +BS, nondistended, non-TTP. No palpable masses or organomegaly.   Extremities:  No edema. Peripheral pulses normal.   Skin:  Warm, dry. No rash or lesions present.  Neuro:  Non-focal. No obvious weakness.   Psych:  Affect normal, eye contact good, speech clear and coherent.      I attest that I, Bayard Hugger, personally documented this note while acting as scribe for [name], FNP.      Bayard Hugger, Scribe.  07/04/2020     The documentation recorded by the scribe accurately reflects the service I personally performed and the decisions made by me.    [name], FNP    Answers for HPI/ROS submitted by the patient on 05/24/2020  Diabetes type: type 2  MedicAlert ID: No  Disease duration: 7 years  blurred vision: No  chest pain: No  fatigue: No  foot paresthesias: No  foot ulcerations: No  polydipsia: Yes  polyphagia: No  polyuria: Yes  visual change: No  weakness: No  weight loss: No  Symptom course: worsening  confusion: No  dizziness: No  headaches: No  hunger: No  mood changes: No  nervous/anxious: No  pallor: No  seizures: No  sleepiness: No  speech difficulty: No  sweats: No  tremors: No  blackouts: No  hospitalization: No  nocturnal hypoglycemia: No  required assistance: No  required glucagon: No  CVA: No  heart disease: No  impotence: Yes  nephropathy: No  peripheral neuropathy: No  PVD: No  retinopathy: No  CAD risks: family history, obesity, sedentary lifestyle  Current treatments: insulin pump, oral agent (monotherapy)  Treatment compliance: most of the time  Dose schedule: pre-breakfast, pre-lunch, pre-dinner, at bedtime  Given by: patient  Injection sites: abdominal wall, arms  Home blood tests: 5+ x per day  Monitoring compliance: good  Blood glucose trend: fluctuating dramatically  breakfast time: 5-6 am  breakfast glucose level: 90-110  lunch time: 12-1 pm  lunch glucose level: >200  dinner time: 7-8 pm  dinner glucose level: >200  High score: >200  Overall: >200  Current diet: diabetic, generally healthy  Meal planning: carbohydrate counting  Exercise: intermittently  Dietitian visit: No  Eye exam current: No  Sees podiatrist: Yes

## 2020-07-04 ENCOUNTER — Encounter: Admit: 2020-07-04 | Discharge: 2020-07-05 | Payer: MEDICAID | Attending: Dermatology | Primary: Dermatology

## 2020-07-04 ENCOUNTER — Encounter: Admit: 2020-07-04 | Discharge: 2020-07-05 | Payer: MEDICAID | Attending: Family | Primary: Family

## 2020-07-04 DIAGNOSIS — L732 Hidradenitis suppurativa: Principal | ICD-10-CM

## 2020-07-04 DIAGNOSIS — E1165 Type 2 diabetes mellitus with hyperglycemia: Principal | ICD-10-CM

## 2020-07-04 DIAGNOSIS — I10 Essential (primary) hypertension: Principal | ICD-10-CM

## 2020-07-04 DIAGNOSIS — Z794 Long term (current) use of insulin: Principal | ICD-10-CM

## 2020-07-04 LAB — COMPREHENSIVE METABOLIC PANEL
ALBUMIN: 3.5 g/dL (ref 3.4–5.0)
ALKALINE PHOSPHATASE: 87 U/L (ref 46–116)
ALT (SGPT): 36 U/L (ref 10–49)
ANION GAP: 8 mmol/L (ref 5–14)
AST (SGOT): 30 U/L (ref <=34)
BILIRUBIN TOTAL: 1.1 mg/dL (ref 0.3–1.2)
BLOOD UREA NITROGEN: 10 mg/dL (ref 9–23)
BUN / CREAT RATIO: 12
CALCIUM: 9.2 mg/dL (ref 8.7–10.4)
CHLORIDE: 106 mmol/L (ref 98–107)
CO2: 24 mmol/L (ref 20.0–31.0)
CREATININE: 0.85 mg/dL
EGFR CKD-EPI AA MALE: >90 mL/min/{1.73_m2} (ref >=60)
EGFR CKD-EPI NON-AA MALE: >90 mL/min/{1.73_m2} (ref >=60)
GLUCOSE RANDOM: 156 mg/dL (ref 70–179)
POTASSIUM: 3.6 mmol/L (ref 3.4–4.5)
PROTEIN TOTAL: 7.3 g/dL (ref 5.7–8.2)
SODIUM: 138 mmol/L (ref 135–145)

## 2020-07-04 LAB — CBC W/ AUTO DIFF
BASOPHILS ABSOLUTE COUNT: 0 10*9/L (ref 0.0–0.1)
BASOPHILS RELATIVE PERCENT: 0.5 %
EOSINOPHILS ABSOLUTE COUNT: 0.1 10*9/L (ref 0.0–0.4)
EOSINOPHILS RELATIVE PERCENT: 1.6 %
HEMATOCRIT: 44 % (ref 41.0–53.0)
HEMOGLOBIN: 14.7 g/dL (ref 13.5–17.5)
LARGE UNSTAINED CELLS: 2 % (ref 0–4)
LYMPHOCYTES ABSOLUTE COUNT: 2.8 10*9/L (ref 1.5–5.0)
LYMPHOCYTES RELATIVE PERCENT: 35.1 %
MEAN CORPUSCULAR HEMOGLOBIN CONC: 33.3 g/dL (ref 31.0–37.0)
MEAN CORPUSCULAR HEMOGLOBIN: 33 pg (ref 26.0–34.0)
MEAN CORPUSCULAR VOLUME: 99 fL (ref 80.0–100.0)
MEAN PLATELET VOLUME: 12.7 fL — ABNORMAL HIGH (ref 7.0–10.0)
MONOCYTES ABSOLUTE COUNT: 0.6 10*9/L (ref 0.2–0.8)
MONOCYTES RELATIVE PERCENT: 7.9 %
NEUTROPHILS ABSOLUTE COUNT: 4.2 10*9/L (ref 2.0–7.5)
NEUTROPHILS RELATIVE PERCENT: 53.2 %
PLATELET COUNT: 189 10*9/L (ref 150–440)
RED BLOOD CELL COUNT: 4.44 10*12/L — ABNORMAL LOW (ref 4.50–5.90)
RED CELL DISTRIBUTION WIDTH: 13.7 % (ref 12.0–15.0)
WBC ADJUSTED: 8 10*9/L (ref 4.5–11.0)

## 2020-07-04 LAB — C-REACTIVE PROTEIN: C-REACTIVE PROTEIN: 11 mg/L — ABNORMAL HIGH (ref <=10.0)

## 2020-07-04 LAB — SEDIMENTATION RATE, MANUAL: ERYTHROCYTE SEDIMENTATION RATE: 48 mm/h — ABNORMAL HIGH (ref 0–15)

## 2020-07-04 MED ORDER — CYCLOBENZAPRINE 10 MG TABLET
ORAL_TABLET | Freq: Three times a day (TID) | ORAL | 0 refills | 20 days | Status: CP | PRN
Start: 2020-07-04 — End: 2021-07-04

## 2020-07-04 NOTE — Unmapped (Signed)
Data reviewed

## 2020-07-05 DIAGNOSIS — N3281 Overactive bladder: Principal | ICD-10-CM

## 2020-07-05 DIAGNOSIS — N3944 Nocturnal enuresis: Principal | ICD-10-CM

## 2020-07-05 MED ORDER — OXYBUTYNIN CHLORIDE 5 MG TABLET
ORAL_TABLET | Freq: Two times a day (BID) | ORAL | 1 refills | 90 days | Status: CP
Start: 2020-07-05 — End: 2021-07-05

## 2020-07-06 NOTE — Unmapped (Signed)
Patient is requesting the following refill  Requested Prescriptions     Pending Prescriptions Disp Refills   ??? oxybutynin (DITROPAN) 5 MG tablet 180 tablet 1     Sig: Take 1 tablet (5 mg total) by mouth Two (2) times a day.       Last OV: 07/04/2020     Last Virtual Visit: Visit date not found     Next OV: 10/02/20.

## 2020-07-13 MED ORDER — EMPTY CONTAINER
3 refills | 0 days
Start: 2020-07-13 — End: ?

## 2020-07-13 NOTE — Unmapped (Signed)
[facility] Refill Coordination Note    Specialty Medication(s) to be Shipped:   Inflammatory Disorders: Humira    Other medication(s) to be shipped: sharps     Sparsh Callens, DOB: 10-03-1980  Phone: [phone] (home)       All above HIPAA information was verified with patient.     Was a Nurse, learning disability used for this call? No    Completed refill call assessment today to schedule patient's medication shipment from the [facility] [phone]).       Specialty medication(s) and dose(s) confirmed: Regimen is correct and unchanged.   Changes to medications: Gilmore reports no changes at this time.  Changes to insurance: No  Questions for the pharmacist: No    Confirmed patient received Welcome Packet with first shipment. The patient will receive a drug information handout for each medication shipped and additional FDA Medication Guides as required.       DISEASE/MEDICATION-SPECIFIC INFORMATION        For patients on injectable medications: Patient currently has 1 doses left.  Next injection is scheduled for 02/03.    SPECIALTY MEDICATION ADHERENCE     Medication Adherence    Patient reported X missed doses in the last month: 0  Specialty Medication: Humira CF 40 mg/0.4 ml   Patient is on additional specialty medications: No  Patient is on more than two specialty medications: No  Any gaps in refill history greater than 2 weeks in the last 3 months: no  Demonstrates understanding of importance of adherence: yes  Informant: patient  Reliability of informant: reliable  Provider-estimated medication adherence level: good  Patient is at risk for Non-Adherence: No                  humira 40/0.4 mg: 7 days of medicine on hand       SHIPPING     Shipping address confirmed in Epic.     Delivery Scheduled: Yes, Expected medication delivery date: 02/08.     Medication will be delivered via UPS to the prescription address in Epic WAM.    Antonietta Barcelona   [facility] Specialty Technician

## 2020-07-17 ENCOUNTER — Ambulatory Visit (INDEPENDENT_AMBULATORY_CARE_PROVIDER_SITE_OTHER): Payer: Medicare Other | Admitting: Podiatry

## 2020-07-17 ENCOUNTER — Other Ambulatory Visit: Payer: Self-pay

## 2020-07-17 ENCOUNTER — Encounter: Payer: Self-pay | Admitting: Podiatry

## 2020-07-17 DIAGNOSIS — E1142 Type 2 diabetes mellitus with diabetic polyneuropathy: Secondary | ICD-10-CM

## 2020-07-17 DIAGNOSIS — B351 Tinea unguium: Secondary | ICD-10-CM

## 2020-07-17 DIAGNOSIS — M79676 Pain in unspecified toe(s): Secondary | ICD-10-CM

## 2020-07-17 MED FILL — HUMIRA PEN CITRATE FREE 40 MG/0.4 ML: 28 days supply | Qty: 8 | Fill #3

## 2020-07-17 MED FILL — EMPTY CONTAINER: 90 days supply | Qty: 1 | Fill #0

## 2020-07-17 NOTE — Progress Notes (Signed)
This patient returns to my office for at risk foot care.  This patient requires this care by a professional since this patient will be at risk due to having diabetes with no complications.  This patient is unable to cut nails himself since the patient cannot reach his nails.These nails are painful walking and wearing shoes.  This patient presents for at risk foot care today.  General Appearance  Alert, conversant and in no acute stress.  Vascular  Dorsalis pedis and posterior tibial  pulses are palpable  bilaterally.  Capillary return is within normal limits  bilaterally. Temperature is within normal limits  bilaterally.  Neurologic  Senn-Weinstein monofilament wire test within normal limits  bilaterally. Muscle power within normal limits bilaterally.  Nails Thick disfigured discolored nails with subungual debris  from hallux to fifth toes bilaterally. No evidence of bacterial infection or drainage bilaterally.  Orthopedic  No limitations of motion  feet .  No crepitus or effusions noted.  No bony pathology or digital deformities noted.  Skin  normotropic skin with no porokeratosis noted bilaterally.  No signs of infections or ulcers noted.     Onychomycosis  Pain in right toes  Pain in left toes  Consent was obtained for treatment procedures.   Mechanical debridement of nails 1-5  bilaterally performed with a nail nipper.  Filed with dremel without incident.    Return office visit   4 months                   Told patient to return for periodic foot care and evaluation due to potential at risk complications.   Helane Gunther DPM

## 2020-07-18 MED ORDER — DEXTROAMPHETAMINE-AMPHETAMINE 20 MG TABLET
ORAL_TABLET | Freq: Two times a day (BID) | ORAL | 0 refills | 30 days | Status: CP
Start: 2020-07-18 — End: ?

## 2020-07-18 NOTE — Unmapped (Signed)
Dispense amount corrected to #60 and new Rx sent.

## 2020-07-19 ENCOUNTER — Encounter: Admit: 2020-07-19 | Discharge: 2020-07-20 | Payer: MEDICAID

## 2020-07-19 DIAGNOSIS — J0140 Acute pansinusitis, unspecified: Principal | ICD-10-CM

## 2020-07-19 MED ORDER — DOXYCYCLINE HYCLATE 100 MG CAPSULE
ORAL_CAPSULE | Freq: Two times a day (BID) | ORAL | 0 refills | 7 days | Status: CP
Start: 2020-07-19 — End: 2020-07-26

## 2020-07-19 MED ORDER — ALBUTEROL SULFATE HFA 90 MCG/ACTUATION AEROSOL INHALER
Freq: Four times a day (QID) | RESPIRATORY_TRACT | 0 refills | 0.00000 days | Status: CP | PRN
Start: 2020-07-19 — End: 2021-07-19

## 2020-07-19 NOTE — Unmapped (Signed)
[facility] ENT Encounter  This medical encounter was conducted virtually using [email or web address]  TeleHealth protocols.    Patient ID: Logan Quinn is a 40 y.o. male who presents by video interaction for evaluation.    I have identified myself to the patient and conveyed my credentials to Erline Levine.   Patient has signed informed consent on file in medical record.    Present on Video Call: Is there someone else in the room? No.    Assessment/Plan:      Problem List Items Addressed This Visit        Respiratory    Acute non-recurrent pansinusitis - Primary     Symptoms of sinus pain, pressure, headache, congestion, mild cough X 1.5 weeks.  Negative home COVID test.  Recommend for nose bleeds- humidifier in the bedroom. Vaseline/aquaphor to the nostrils several times a day.  Rx sent in for doxycyline 100mg  BID X  7 Days- for bacterial sinusitis.  Rx sent in for albuterol.  Given ER/urgent care precautions.  Follow up if symptoms persist or worsen.  Patient verbalizes understanding and has no further questions at this time.         Relevant Medications    albuterol HFA 90 mcg/actuation inhaler    doxycycline (VIBRAMYCIN) 100 MG capsule        Medication adherence and barriers to the treatment plan have been addressed. Opportunities to optimize healthy behaviors have been discussed. Patient / caregiver voiced understanding.     Subjective:     HPI  Logan Quinn is 40 y.o. and presents today in the [facility] with ENT symptoms.  The PCP for this patient is [name], FNP.   Patient reports congestion, headache, bilateral sinus pain- both sides of the nose.  Symptoms started on 07/08/2020- about 1.5 weeks ago.  Multiple nose bleeds in the past week.  Mild cough.  No SOB, but does report some wheezing.  At home COVID test was negative. Using advair- this is a routine med.  Taking ibuprofen, sudafed. Not helping much.  Negative home COVID test.    ROS  Review of Systems All other ROS per HPI.    I have reviewed the problem list, past medical history, past family history, medications, and allergies and have updated/reconciled them if needed.     Objective:   Physical Exam  As part of this Video Visit, no in-person exam was conducted.  Video interaction permitted the following observations.    General: No acute distress.   HEENT: No visible mass or abnormality of neck.   RESP: Relaxed respiratory effort. No conversational dyspnea.   SKIN: No rashes noted.  NEURO: Normal coordination.  No tremors observed.  PSYCH: Alert and oriented.  Speech fluent and sensible.  Calm affect.      The patient reports they are currently: at home. I spent 6 minutes on the real-time audio and video with the patient on the date of service. I spent an additional 5 minutes on pre- and post-visit activities on the date of service.     The patient was physically located in [place] or a state in which I am permitted to provide care. The patient and/or parent/guardian understood that s/he may incur co-pays and cost sharing, and agreed to the telemedicine visit. The visit was reasonable and appropriate under the circumstances given the patient's presentation at the time.    The patient and/or parent/guardian has been advised of the potential risks  and limitations of this mode of treatment (including, but not limited to, the absence of in-person examination) and has agreed to be treated using telemedicine. The patient's/patient's family's questions regarding telemedicine have been answered.     If the visit was completed in an ambulatory setting, the patient and/or parent/guardian has also been advised to contact their provider???s office for worsening conditions, and seek emergency medical treatment and/or call 911 if the patient deems either necessary.

## 2020-07-19 NOTE — Unmapped (Signed)
Symptoms of sinus pain, pressure, headache, congestion, mild cough X 1.5 weeks.  Negative home COVID test.  Recommend for nose bleeds- humidifier in the bedroom. Vaseline/aquaphor to the nostrils several times a day.  Rx sent in for doxycyline 100mg  BID X  7 Days- for bacterial sinusitis.  Rx sent in for albuterol.  Given ER/urgent care precautions.  Follow up if symptoms persist or worsen.  Patient verbalizes understanding and has no further questions at this time.

## 2020-07-20 NOTE — Unmapped (Signed)
Recent:   What is the date of your last related visit?  07/18/20 DX bronchitis, sinus infection  Related acute medications Rx'd:  Doxycycline, albuterol  Home treatment tried:  duomed sinus rinse  COVID Vaccine: no  information    Relevant:   Allergies: Erythromycin, Penicillins, Sulfa (sulfonamide antibiotics), Other, Erythromycin base, and Sulfasalazine  Medications: albuterol  Health History: chronic bronchitis  Weight: n/a      Reason for Disposition  ??? Mild earache and ear congestion (fullness) occurring during air travel    Answer Assessment - Initial Assessment Questions  1. LOCATION: Which ear is involved?      Both ears  2. ONSET: When did the ear start hurting       Started 10 minutes ago  3. SEVERITY: How bad is the pain?  (Scale 1-10; mild, moderate or severe)    - MILD (1-3): doesn't interfere with normal activities     - MODERATE (4-7): interferes with normal activities or awakens from sleep     - SEVERE (8-10): excruciating pain, unable to do any normal activities       4/10, moving around is 8/10  4. URI SYMPTOMS: Do you have a runny nose or cough?      Sinus infection  5. FEVER: Do you have a fever? If Yes, ask: What is your temperature, how was it measured, and when did it start?      denies  6. CAUSE: Have you been swimming recently?, How often do you use Q-TIPS?, Have you had any recent air travel or scuba diving?      Saline rinse, denies q-tips  7. OTHER SYMPTOMS: Do you have any other symptoms? (e.g., headache, stiff neck, dizziness, vomiting, runny nose, decreased hearing)      Completing a sinus rinse and held head over basin instead of sitting up and then had pain. Pain is improving some once sitting up. Denies drainage or  Swelling. Able to bend neck normally. Breathing feels stable  8. PREGNANCY: Is there any chance you are pregnant? When was your last menstrual period?      n/a    Protocols used: EARACHE-A-AH

## 2020-08-02 DIAGNOSIS — L732 Hidradenitis suppurativa: Principal | ICD-10-CM

## 2020-08-04 DIAGNOSIS — L732 Hidradenitis suppurativa: Principal | ICD-10-CM

## 2020-08-04 MED ORDER — HUMIRA PEN CITRATE FREE 40 MG/0.4 ML
3 refills | 0.00000 days
Start: 2020-08-04 — End: ?

## 2020-08-04 NOTE — Unmapped (Signed)
Done. Patient has been schd.     Dr. Elder Cyphers,     Patient has been schd an appt for May he stated that he needs a medication refill informed him that he may need to see you first before a refill can be done     Thanks  [facility]

## 2020-08-07 DIAGNOSIS — L732 Hidradenitis suppurativa: Principal | ICD-10-CM

## 2020-08-07 MED ORDER — HUMIRA PEN CITRATE FREE 40 MG/0.4 ML
SUBCUTANEOUS | 3 refills | 0.00000 days | Status: CP
Start: 2020-08-07 — End: ?

## 2020-08-08 DIAGNOSIS — L732 Hidradenitis suppurativa: Principal | ICD-10-CM

## 2020-08-08 MED ORDER — HUMIRA PEN CITRATE FREE 40 MG/0.4 ML
SUBCUTANEOUS | 2 refills | 28.00000 days | Status: CP
Start: 2020-08-08 — End: ?
  Filled 2020-08-22: qty 4, 28d supply, fill #0

## 2020-08-08 NOTE — Unmapped (Signed)
Clinical Assessment Needed For: Dose Change  Medication: Humira cf pen 40mg /0.67ml  Last Fill Date/Day Supply: 07/17/20 / 28 days  Copay $0  Was previous dose already scheduled to fill: No    Notes to Pharmacist: Pa for 80mg  dosing was denied- provider sent new rx for 40mg  dosing- please help confirm with patient he is aware to continue 40mg  until 80mg  approved.

## 2020-08-09 MED ORDER — ALPRAZOLAM 0.5 MG TABLET
ORAL_TABLET | Freq: Three times a day (TID) | ORAL | 1 refills | 30 days
Start: 2020-08-09 — End: 2021-08-09

## 2020-08-09 NOTE — Unmapped (Signed)
Patient is requesting the following refill  Requested Prescriptions     Pending Prescriptions Disp Refills   ??? ALPRAZolam (XANAX) 0.5 MG tablet 90 tablet 1     Sig: Take 1 tablet (0.5 mg total) by mouth Three (3) times a day.       Last refill given on 07/12/20 with 90 count and 0 refills.     Recent Visits  Date Type Provider Dept   07/04/20 Office Visit [name] Rosita Fire, FNP [facility]   02/21/20 Office Visit [name] Rosita Fire, FNP [facility]   11/18/19 Office Visit [name] Rosita Fire, FNP [facility]   09/29/19 Office Visit [name] Rosita Fire, FNP [facility]   08/18/19 Office Visit [name] Rosita Fire, FNP [facility] [place] [facility]   Showing recent visits within past 365 days with a meds authorizing provider and meeting all other requirements  Future Appointments  Date Type Provider Dept   10/02/20 Appointment [name] Rosita Fire, FNP [facility]   Showing future appointments within next 365 days with a meds authorizing provider and meeting all other requirements       Opioid Monitoring   Urine Tox ScreenLast Drug Screen Date: Not Found  Opiate Confirmation Test Last Drug Screen Date: Not Found  Last Opioid Dispensed Provider: Jamas Lav, MD  Prescribed MEDD : 0  Last PDMP Review: 06/16/2019  3:25 PM  08/09/20  Last Pain Agreement Signed Date: Not Found
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
## 2020-08-11 ENCOUNTER — Encounter: Admit: 2020-08-11 | Discharge: 2020-08-12 | Payer: MEDICAID | Attending: Family | Primary: Family

## 2020-08-11 DIAGNOSIS — M25511 Pain in right shoulder: Principal | ICD-10-CM

## 2020-08-11 DIAGNOSIS — M25512 Pain in left shoulder: Principal | ICD-10-CM

## 2020-08-11 DIAGNOSIS — J4520 Mild intermittent asthma, uncomplicated: Principal | ICD-10-CM

## 2020-08-12 MED ORDER — DIAZEPAM 5 MG TABLET
ORAL_TABLET | Freq: Once | ORAL | 0 refills | 0.00000 days | Status: CP
Start: 2020-08-12 — End: 2020-08-12

## 2020-08-18 DIAGNOSIS — M25512 Pain in left shoulder: Principal | ICD-10-CM

## 2020-08-18 DIAGNOSIS — M25511 Pain in right shoulder: Principal | ICD-10-CM

## 2020-08-18 DIAGNOSIS — B356 Tinea cruris: Principal | ICD-10-CM

## 2020-08-18 MED ORDER — FLUCONAZOLE 150 MG TABLET
ORAL_TABLET | Freq: Once | ORAL | 0 refills | 2 days | Status: CP
Start: 2020-08-18 — End: 2020-08-18

## 2020-08-18 NOTE — Unmapped (Signed)
Need new separate orders for each shoulder MRI per radiology.  Orders in.

## 2020-08-18 NOTE — Unmapped (Signed)
Sent portal message he has a bad yeast infection.  He sates when it gets this bad he needs the pill form and [name] has sent in for him before.  Spoke to [facility] and he wants refill on Diflucan.  He is aware Lorina Rabon is off.  Dr. Dennard Nip gave verbal okay.  [employer].  RX sent.

## 2020-08-21 NOTE — Unmapped (Signed)
I informed Logan Quinn his Humira 80mg /week had been denied, but that we are appealing that decision. I reviewed that for now, we'll have him drop back to 1 pen (40 mg) per week. We discussed his flare strategies including clindamycin topical, Keflex course prn.    He is currently having more flares due to warmer weather. I encouraged him to reach out to clinic if his flare strategies aren't helpful.       [name] Clinical Assessment & Refill Coordination Note    Logan Quinn, DOB: 05-04-1981  Phone: [phone] (home)     All above HIPAA information was verified with patient.     Was a Nurse, learning disability used for this call? No    Specialty Medication(s):   Inflammatory Disorders: Humira     Current Outpatient Medications   Medication Sig Dispense Refill   ??? acetaminophen (TYLENOL) 500 MG tablet Take 2 tablets (1,000 mg total) by mouth Three (3) times a day. 30 tablet 0   ??? ADVAIR DISKUS 500-50 mcg/dose diskus Inhale 1 puff 2 (two) times a day. 60 each 11   ??? albuterol 2.5 mg /3 mL (0.083 %) nebulizer solution Inhale 3 mL (2.5 mg total) by nebulization every four (4) hours as needed for wheezing. 180 mL 2   ??? albuterol HFA 90 mcg/actuation inhaler Inhale 2 puffs every six (6) hours as needed for wheezing. 8 g 0   ??? ALPRAZolam (XANAX) 0.5 MG tablet Take 1 tablet (0.5 mg total) by mouth Three (3) times a day. 90 tablet 1   ??? blood sugar diagnostic Strp by Other route Three (3) times a day before meals. 300 each 1   ??? blood-glucose meter kit Use as directed 1 each 0   ??? cetirizine (ZYRTEC) 10 MG tablet Take 1 tablet (10 mg total) by mouth nightly as needed for allergies. 90 tablet 1   ??? clindamycin (CLEOCIN T) 1 % lotion APPLY TOPICALLY AT NIGHT TO INFLAMED AREAS AS NEEDED FOR FLARES     ??? clobetasoL (TEMOVATE) 0.05 % ointment Apply daily to painful affected areas if needed for flares, then stop 15 g 1   ??? colchicine (MITIGARE) 0.6 mg cap capsule Take 1.2 mg on Day 1 of flare, followed by 0.6 mg one hour later. Day 2 take 0.6 mg BID until flare resolves. 60 capsule 3   ??? cyclobenzaprine (FLEXERIL) 10 MG tablet Take 1 tablet (10 mg total) by mouth Three (3) times a day as needed for muscle spasms. 60 tablet 0   ??? dextroamphetamine-amphetamine (ADDERALL) 20 mg tablet Take 1 tablet by mouth Two (2) times a day. 60 tablet 0   ??? dicyclomine (BENTYL) 10 mg capsule Take 1 capsule (10 mg total) by mouth Three (3) times a day. 270 capsule 1   ??? diphenoxylate-atropine (LOMOTIL) 2.5-0.025 mg per tablet Take 1 tablet by mouth two (2) times a day as needed for diarrhea. 30 tablet 5   ??? DULoxetine (CYMBALTA) 60 MG capsule Take 1 capsule (60 mg total) by mouth Two (2) times a day. 180 capsule 0   ??? empty container (SHARPS CONTAINER) Misc Use as directed to dispose of Humira needles 1 each 2   ??? empty container Misc Use as directed to dispose of Humira injections 1 each 2   ??? empty container Misc use as directed 1 each 3   ??? erenumab-aooe 140 mg/mL AtIn Inject 140 mg under the skin every twenty-eight (28) days. 1 mL 6   ???  famotidine (PEPCID) 20 MG tablet Take 1 tablet (20 mg total) by mouth Two (2) times a day. 180 tablet 2   ??? fluticasone propionate (FLONASE) 50 mcg/actuation nasal spray 1 spray into each nostril Two (2) times a day. 16 g 5   ??? gabapentin (NEURONTIN) 300 MG capsule Take 300 mg by mouth Three (3) times a day.     ??? glipiZIDE (GLUCOTROL) 10 MG tablet Take 2 tablets (20 mg total) by mouth Two (2) times a day (30 minutes before a meal). 360 tablet 1   ??? HUMALOG U-100 INSULIN 100 unit/mL injection INJECT 120 UNITS UNDER THE SKIN DAILY VIA OMNIPOD]     ??? HUMIRA PEN CITRATE FREE 40 MG/0.4 ML Inject the contents of 2 pens (80mg  total) under the skin every seven (7) days. 8 each 3   ??? HUMIRA PEN CITRATE FREE 40 MG/0.4 ML Inject the contents of 1 pen (40 mg total) under the skin once a week. 4 each 2   ??? hydrocortisone (PROCTOSOL HC) 2.5 % rectal cream Insert 1 application into the rectum two (2) times a day as needed. 28.35 g 2   ??? hydrOXYzine (ATARAX) 25 MG tablet Take 1 tablet (25 mg total) by mouth Three (3) times a day as needed. 120 tablet 1   ??? ibuprofen (ADVIL,MOTRIN) 800 MG tablet Take 1 tablet (800 mg total) by mouth every eight (8) hours as needed. 90 tablet 1   ??? insulin pump cartridge Crtg Inject 120 Units under the skin daily. 5 each 5   ??? ketoconazole (NIZORAL) 2 % cream Apply 1 application topically daily. 15 g 5   ??? lancets Misc Type 2 diabetes, poorly controlled 250.02   Test 2 times daily 100 each 11   ??? levothyroxine (SYNTHROID) 50 MCG tablet Take 1 tablet (50 mcg total) by mouth daily. 90 tablet 1   ??? lidocaine (XYLOCAINE) 5 % ointment lidocaine 5 % topical ointment     ??? montelukast (SINGULAIR) 10 mg tablet Take 1 tablet (10 mg total) by mouth daily. 90 each 1   ??? nystatin (MYCOSTATIN) 100,000 unit/gram powder Apply to affected area 3 times daily 30 g 1   ??? oxybutynin (DITROPAN) 5 MG tablet Take 1 tablet (5 mg total) by mouth Two (2) times a day. 180 tablet 1   ??? pantoprazole (PROTONIX) 40 MG tablet Take 1 tablet (40 mg total) by mouth Two (2) times a day. 180 tablet 1   ??? polyethylene glycol (GLYCOLAX) 17 gram/dose powder take 17GM (DISSOLVED IN WATER) by mouth once daily     ??? PROAIR HFA 90 mcg/actuation inhaler Inhale 2 puffs every four (4) hours as needed for wheezing. 3 Inhaler 1   ??? propranoloL (INDERAL LA) 120 mg 24 hr capsule Take 1 capsule (120 mg total) by mouth daily. 90 capsule 1   ??? sour cherry extract (TART CHERRY EXTRACT) 1,000 mg cap      ??? topiramate (TOPAMAX) 50 MG tablet Take 1.5 tablets (75 mg total) by mouth Two (2) times a day. 270 tablet 1   ??? traZODone (DESYREL) 100 MG tablet Take 2 tablets (200 mg total) by mouth nightly. 180 tablet 0     No current facility-administered medications for this visit.        Changes to medications: Logan Quinn reports no changes at this time.    Allergies   Allergen Reactions   ??? Erythromycin Nausea And Vomiting     Other reaction(s): Vomiting   ??? Penicillins Rash, Swelling  and Hives   ??? Sulfa (Sulfonamide Antibiotics) Nausea And Vomiting and Nausea Only   ??? Other    ??? Erythromycin Base Rash   ??? Sulfasalazine Nausea And Vomiting       Changes to allergies: No    SPECIALTY MEDICATION ADHERENCE     HUMIRA CF PEN 40 mg/ 0.4 ml: 2 doses of 80 mg left  Medication Adherence    Patient reported X missed doses in the last month: 0  Specialty Medication: Humira  Patient is on additional specialty medications: No          Specialty medication(s) dose(s) confirmed: dose DECREASE with this fill back to 40 mg/week - due to insurance denial     Are there any concerns with adherence? No    Adherence counseling provided? Not needed    CLINICAL MANAGEMENT AND INTERVENTION      Clinical Benefit Assessment:    Do you feel the medicine is effective or helping your condition? Yes    Clinical Benefit counseling provided? Not needed    Adverse Effects Assessment:    Are you experiencing any side effects? No    Are you experiencing difficulty administering your medicine? No    Quality of Life Assessment:    How many days over the past month did your hidradenitis suppurativa  keep you from your normal activities? For example, brushing your teeth or getting up in the morning. 0    Have you discussed this with your provider? Not needed    Therapy Appropriateness:    Is therapy appropriate? Yes, therapy is appropriate and should be continued    DISEASE/MEDICATION-SPECIFIC INFORMATION      For patients on injectable medications: Patient currently has 2 doses left.  Next injection is scheduled for Thurs, 3/17.    PATIENT SPECIFIC NEEDS     - Does the patient have any physical, cognitive, or cultural barriers? No    - Is the patient high risk? No    - Does the patient require a Care Management Plan? No     - Does the patient require physician intervention or other additional services (i.e. nutrition, smoking cessation, social work)? No      SHIPPING     Specialty Medication(s) to be Shipped:   Inflammatory Disorders: Humira    Other medication(s) to be shipped: No additional medications requested for fill at this time     Changes to insurance: No    Delivery Scheduled: Yes, Expected medication delivery date: Tues, 3/15.     Medication will be delivered via Same Day Courier to the confirmed prescription address in [facility].    The patient will receive a drug information handout for each medication shipped and additional FDA Medication Guides as required.  Verified that patient has previously received a Conservation officer, historic buildings.    All of the patient's questions and concerns have been addressed.    Lanney Gins   [facility] Specialty Pharmacist

## 2020-08-25 MED ORDER — PROMETHAZINE 25 MG TABLET
ORAL_TABLET | Freq: Every evening | ORAL | 3 refills | 15 days | Status: CP | PRN
Start: 2020-08-25 — End: 2021-08-26

## 2020-08-25 MED ORDER — ONDANSETRON 4 MG DISINTEGRATING TABLET
ORAL_TABLET | Freq: Three times a day (TID) | ORAL | 1 refills | 5 days | Status: CP | PRN
Start: 2020-08-25 — End: 2021-08-25

## 2020-08-25 NOTE — Unmapped (Signed)
Patient is requesting the following refill  Requested Prescriptions     Pending Prescriptions Disp Refills   ??? promethazine (PHENERGAN) 25 MG tablet 30 tablet 3     Sig: Take 1-2 tablets (25-50 mg total) by mouth nightly as needed for nausea.   ??? ondansetron (ZOFRAN-ODT) 4 MG disintegrating tablet 30 tablet 3     Sig: Take 1 tablet (4 mg total) by mouth every eight (8) hours as needed for nausea for up to 15 doses.       Recent Visits  Date Type Provider Dept   08/11/20 Office Visit [name] Rosita Fire, FNP [place] [facility]   07/04/20 Office Visit [name] Rosita Fire, FNP [place] [facility]   02/21/20 Office Visit [name] Rosita Fire, FNP [place] [facility]   11/18/19 Office Visit [name] Rosita Fire, FNP [place] [facility]   09/29/19 Office Visit [name] Rosita Fire, FNP  [facility] At [facility]   Showing recent visits within past 365 days with a meds authorizing provider and meeting all other requirements  Future Appointments  Date Type Provider Dept   10/02/20 Appointment [name] Rosita Fire, FNP  [facility] At [facility]   Showing future appointments within next 365 days with a meds authorizing provider and meeting all other requirements

## 2020-08-29 ENCOUNTER — Encounter: Admit: 2020-08-29 | Discharge: 2020-08-30 | Payer: MEDICAID

## 2020-08-30 DIAGNOSIS — G43909 Migraine, unspecified, not intractable, without status migrainosus: Principal | ICD-10-CM

## 2020-08-30 MED ORDER — TOPIRAMATE 50 MG TABLET
ORAL_TABLET | Freq: Two times a day (BID) | ORAL | 1 refills | 90 days | Status: CP
Start: 2020-08-30 — End: 2021-08-30

## 2020-08-30 MED ORDER — LEVOTHYROXINE 50 MCG TABLET
ORAL_TABLET | Freq: Every day | ORAL | 1 refills | 90.00000 days | Status: CP
Start: 2020-08-30 — End: 2021-08-30

## 2020-09-05 DIAGNOSIS — M25511 Pain in right shoulder: Principal | ICD-10-CM

## 2020-09-05 DIAGNOSIS — M25512 Pain in left shoulder: Principal | ICD-10-CM

## 2020-09-05 NOTE — Unmapped (Signed)
Per [name] referral placed to [facility]

## 2020-09-11 MED ORDER — COLCHICINE 0.6 MG CAPSULE
ORAL_CAPSULE | 3 refills | 0 days | Status: CP
Start: 2020-09-11 — End: 2021-09-11

## 2020-09-11 NOTE — Unmapped (Signed)
Patient is requesting the following refill  Requested Prescriptions     Signed Prescriptions Disp Refills   ??? colchicine (MITIGARE) 0.6 mg cap capsule 60 capsule 3     Sig: Take 1.2 mg on Day 1 of flare, followed by 0.6 mg one hour later. Day 2 take 0.6 mg BID until flare resolves.     Authorizing Provider: Loran Senters     Ordering User: Roberto Scales       Recent Visits  Date Type Provider Dept   08/11/20 Office Visit [name] Rosita Fire, FNP [place] [facility]   07/04/20 Office Visit [name] Rosita Fire, FNP [place] [facility]   02/21/20 Office Visit [name] Rosita Fire, FNP [place] [facility]   11/18/19 Office Visit [name] Rosita Fire, FNP [place] [facility]   09/29/19 Office Visit [name] Rosita Fire, FNP [place] [facility]   Showing recent visits within past 365 days with a meds authorizing provider and meeting all other requirements  Future Appointments  Date Type Provider Dept   10/02/20 Appointment [name] Rosita Fire, FNP [place] [facility]   Showing future appointments within next 365 days with a meds authorizing provider and meeting all other requirements

## 2020-09-13 MED ORDER — FLUCONAZOLE 150 MG TABLET
ORAL_TABLET | ORAL | 0 refills | 28 days | Status: CP
Start: 2020-09-13 — End: ?

## 2020-09-13 NOTE — Unmapped (Signed)
Therapy Update Follow Up: Prior Authorization Approved - Copay = $0

## 2020-09-13 NOTE — Unmapped (Signed)
[facility] Refill Coordination Note    Specialty Medication(s) to be Shipped:   Inflammatory Disorders: Humira    Other medication(s) to be shipped: No additional medications requested for fill at this time     Logan Quinn, DOB: Dec 26, 1980  Phone: [phone] (home)       All above HIPAA information was verified with patient.     Was a Nurse, learning disability used for this call? No    Completed refill call assessment today to schedule patient's medication shipment from the [facility] [phone]).       Specialty medication(s) and dose(s) confirmed: Patient reports changes to the regimen as follows: humira now injecting 2 pens every 7 days   Changes to medications: Logan Quinn reports no changes at this time.  Changes to insurance: No  Questions for the pharmacist: No    Confirmed patient received a Conservation officer, historic buildings and a Surveyor, mining with first shipment. The patient will receive a drug information handout for each medication shipped and additional FDA Medication Guides as required.       DISEASE/MEDICATION-SPECIFIC INFORMATION        For patients on injectable medications: Patient currently has 0 doses left.  Next injection is scheduled for 04/07.    SPECIALTY MEDICATION ADHERENCE     Medication Adherence    Patient reported X missed doses in the last month: 0  Specialty Medication: humira 40mg /0.41ml  Patient is on additional specialty medications: No  Patient is on more than two specialty medications: No  Any gaps in refill history greater than 2 weeks in the last 3 months: no  Demonstrates understanding of importance of adherence: yes  Informant: patient  Reliability of informant: reliable  Provider-estimated medication adherence level: good  Patient is at risk for Non-Adherence: No                humira 40/0.5mg /ml: 0 days of medicine on hand       SHIPPING     Shipping address confirmed in Epic.     Delivery Scheduled: Yes, Expected medication delivery date: 04/07. Medication will be delivered via Same Day Courier to the prescription address in Epic [facility].    Antonietta Barcelona   [facility] Specialty Technician

## 2020-09-14 MED FILL — HUMIRA PEN CITRATE FREE 40 MG/0.4 ML: 28 days supply | Qty: 8 | Fill #0

## 2020-09-18 MED ORDER — DEXTROAMPHETAMINE-AMPHETAMINE ER 20 MG 24HR CAPSULE,EXTEND RELEASE
Freq: Every morning | ORAL | 0.00000 days
Start: 2020-09-18 — End: ?

## 2020-09-20 ENCOUNTER — Encounter: Admit: 2020-09-20 | Discharge: 2020-09-21 | Payer: MEDICAID

## 2020-09-20 NOTE — Unmapped (Signed)
1. 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  The patient was advised to maintain tight glucose control, tight blood pressure control, and favorable levels of cholesterol.  Follow up in one year was recommended.    2. CHRPE OS  Incidental finding. No risk. Monitor.    3. H/o mac off retinal detachment OS  Cryo/ pneumatic 04/12/13. History of mild residual loculated subretinal fluid in macular -likely etiology of pigmentary changes  Flat and attached. Educated about F/F/shadows. Monitor.  4. refr d/o- new rx provided.      RTC 1 year  Cc [name], FNP     INTERPRETATION EXTENDED OPHTHALMOSCOPY (20D with scleral depression)  Retinal Periphery - right:  retina flat and attached, no signs of DR  Retinal Periphery - left:  Well treated scar  at 11 o'clock,  at 3 o'clock flat pigmented plaque consistent with CHRPE adjacent to ora, 1500 microns in size,  pigmented demarcation line in superior mid periphery     As of 09/20/2020 the assessment and plan are unchanged.

## 2020-09-21 MED ORDER — METHYLPREDNISOLONE 4 MG TABLETS IN A DOSE PACK
0.00000 days
Start: 2020-09-21 — End: ?

## 2020-09-29 NOTE — Unmapped (Unsigned)
Assessment and Plan:     There are no diagnoses linked to this encounter.     BP {at goal/not at UJWJ:19147} (***/*** in clinic today). Continue propranolol 120 mg daily***. Reviewed low sodium diet and encouraged regular exercise. Advised to continue to monitor and log at-home BP readings.    HGB A1c *** ({up/down:77630} from 8.9 three months ago).   DM {controlled/uncontrolled:77631}. Continue glipizide 20 mg BID,??use of OmniPod***.  Encouraged patient to continue carb controlled diet and regular exercise.     I personally spent *** minutes face-to-face and non-face-to-face in the care of this patient, which includes all pre, intra, and post visit time on the date of service.    No follow-ups on file.    HPI:      Logan Quinn  is here for No chief complaint on file.    Hypertension: Patient presents for follow-up of hypertension. Blood pressure goal < 140/90.  Hypertension has customarily {been/notbeen:38678} at goal complicated by ***.  Home blood pressure readings: {home bp readings:17448}. Salt intake and diet: {salt intake/diet:17449}. Associated signs and symptoms: {symptoms hypertension:17452}. Patient denies: {symptoms hypertension:19611}. Medication compliance: {med compliance:10573}. He {is/is not:23060} doing regular exercise.      Diabetes: Patient presents for follow up of diabetes.  A1C goal is <8.  Diabetes has customarily {been/not been:38678} at goal (complicated by: ***).  Current symptoms include: {Symptoms; diabetes:14075::none}. Symptoms have {symptom progression:19445}. Patient denies {Symptoms; diabetes:14075}. Evaluation to date has included: {dm labs:8486664408}.  Home sugars: {dm home sugars:14018}. Current treatment: Continued glipizide and insulin (OmniPod) which has been {effective/ineffective:14021}.  Doing regular exercise: {yes no:22180}.     {kerichronicdx:74702}    {keriacutedx:74703}     ROS:      Comprehensive 10 point ROS negative unless otherwise stated in the HPI.      [facility] Components:     Medication adherence and barriers to the treatment plan have been addressed. Opportunities to optimize healthy behaviors have been discussed. Patient / caregiver voiced understanding.    Past Medical/Surgical History:     Past Medical History:   Diagnosis Date   ??? Acne    ??? Allergic    ??? Anxiety    ??? Depression    ??? Diabetes mellitus (CMS-HCC) Dx 2013    Type II   ??? GERD (gastroesophageal reflux disease)    ??? Gout    ??? Hypertension    ??? IBS (irritable bowel syndrome)    ??? 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 Liver disease    ??? Migraines    ??? Morbid obesity with BMI of 60.0-69.9, adult (CMS-HCC)    ??? 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 Obstructive sleep apnea    ??? OSA on CPAP    ??? Severe obstructive sleep apnea    ??? Trapezius muscle strain 12/07/2013   ??? Urinary incontinence, nocturnal enuresis    ??? Venous insufficiency      Past Surgical History:   Procedure Laterality Date   ??? EYE SURGERY  11/15   ??? PR COLONOSCOPY FLX DX W/COLLJ SPEC WHEN PFRMD N/A 03/24/2013    Procedure: COLONOSCOPY, FLEXIBLE, PROXIMAL TO SPLENIC FLEXURE; DIAGNOSTIC, W/WO COLLECTION SPECIMEN BY BRUSH OR WASH;  Surgeon: Clint Bolder, MD;  [facility];  Service: Gastroenterology   ??? PR EYE SURG POST SGMT PROC UNLISTED Left     pneumatic retinopexy OS   ??? PR UPPER GI ENDOSCOPY,DIAGNOSIS N/A 02/02/2013    Procedure: UGI ENDO, INCLUDE  ESOPHAGUS, STOMACH, & DUODENUM &/OR JEJUNUM; DX W/WO COLLECTION SPECIMN, BY BRUSH OR WASH;  Surgeon: Malcolm Metro, MD;  Location: [facility];  Service: Gastroenterology   ??? [place] PYLORIC STENOSIS ([place] HISTORICAL RESULT)         Family History:     Family History   Problem Relation Age of Onset   ??? Cancer Maternal Grandfather    ??? Hearing loss Maternal Grandfather    ??? Cancer Paternal Grandfather    ??? COPD Paternal Grandmother    ??? Arthritis Paternal Grandmother    ??? Depression Paternal Grandmother    ??? Diabetes Mother    ??? Heart disease Mother    ??? Migraines Mother    ??? Arthritis Mother    ??? Depression Mother    ??? GER disease Mother    ??? Hypertension Mother    ??? Angina Mother    ??? COPD Mother    ??? Glaucoma Mother    ??? Hearing loss Mother    ??? Diabetes Sister    ??? Migraines Sister    ??? Asthma Sister    ??? Depression Sister    ??? Hearing loss Sister    ??? Diabetes Brother    ??? Asthma Brother    ??? Diabetes Maternal Grandmother    ??? Heart disease Maternal Grandmother    ??? Migraines Maternal Grandmother    ??? Depression Maternal Grandmother    ??? Angina Maternal Grandmother    ??? Hypertension Maternal Grandmother    ??? Stroke Maternal Grandmother    ??? Diabetes Maternal Uncle    ??? Hearing loss Maternal Uncle    ??? Diabetes Maternal Uncle    ??? Liver disease Maternal Uncle    ??? Kidney disease Maternal Uncle    ??? Asthma Brother    ??? No Known Problems Father    ??? No Known Problems Maternal Aunt    ??? No Known Problems Paternal Aunt    ??? No Known Problems Paternal Uncle    ??? No Known Problems Other    ??? Colorectal Cancer Neg Hx    ??? Esophageal cancer Neg Hx    ??? Liver cancer Neg Hx    ??? Pancreatic cancer Neg Hx    ??? Stomach cancer Neg Hx    ??? Amblyopia Neg Hx    ??? Blindness Neg Hx    ??? Retinal detachment Neg Hx    ??? Strabismus Neg Hx    ??? Macular degeneration Neg Hx    ??? Anesthesia problems Neg Hx    ??? Broken bones Neg Hx    ??? Clotting disorder Neg Hx    ??? Collagen disease Neg Hx    ??? Dislocations Neg Hx    ??? Fibromyalgia Neg Hx    ??? Gout Neg Hx    ??? Hemophilia Neg Hx    ??? Osteoporosis Neg Hx    ??? Rheumatologic disease Neg Hx    ??? Scoliosis Neg Hx    ??? Severe sprains Neg Hx    ??? Sickle cell anemia Neg Hx    ??? Spinal Compression Fracture Neg Hx    ??? Melanoma Neg Hx    ??? Basal cell carcinoma Neg Hx    ??? Squamous cell carcinoma Neg Hx    ??? Deep vein thrombosis Neg Hx    ??? Cataracts Neg Hx    ??? Thyroid disease Neg Hx        Social History:     Social  History     Tobacco Use   ??? Smoking status: Never Smoker   ??? Smokeless tobacco: Never Used   Vaping Use   ??? Vaping Use: Never used Substance Use Topics   ??? Alcohol use: Never     Alcohol/week: 0.0 standard drinks     Comment: rare social   ??? Drug use: Never       Allergies:     Erythromycin, Penicillins, Sulfa (sulfonamide antibiotics), Other, Erythromycin base, and Sulfasalazine    Current Medications:     Current Outpatient Medications   Medication Sig Dispense Refill   ??? acetaminophen (TYLENOL) 500 MG tablet Take 2 tablets (1,000 mg total) by mouth Three (3) times a day. 30 tablet 0   ??? ADVAIR DISKUS 500-50 mcg/dose diskus Inhale 1 puff 2 (two) times a day. 60 each 11   ??? albuterol 2.5 mg /3 mL (0.083 %) nebulizer solution Inhale 3 mL (2.5 mg total) by nebulization every four (4) hours as needed for wheezing. 180 mL 2   ??? albuterol HFA 90 mcg/actuation inhaler Inhale 2 puffs every six (6) hours as needed for wheezing. 8 g 0   ??? ALPRAZolam (XANAX) 0.5 MG tablet Take 1 tablet (0.5 mg total) by mouth Three (3) times a day. 90 tablet 1   ??? blood sugar diagnostic Strp by Other route Three (3) times a day before meals. 300 each 1   ??? blood-glucose meter kit Use as directed 1 each 0   ??? cetirizine (ZYRTEC) 10 MG tablet Take 1 tablet (10 mg total) by mouth nightly as needed for allergies. 90 tablet 1   ??? clindamycin (CLEOCIN T) 1 % lotion APPLY TOPICALLY AT NIGHT TO INFLAMED AREAS AS NEEDED FOR FLARES     ??? clobetasoL (TEMOVATE) 0.05 % ointment Apply daily to painful affected areas if needed for flares, then stop 15 g 1   ??? colchicine (MITIGARE) 0.6 mg cap capsule Take 1.2 mg on Day 1 of flare, followed by 0.6 mg one hour later. Day 2 take 0.6 mg BID until flare resolves. 60 capsule 3   ??? cyclobenzaprine (FLEXERIL) 10 MG tablet Take 1 tablet (10 mg total) by mouth Three (3) times a day as needed for muscle spasms. 60 tablet 0   ??? dextroamphetamine-amphetamine (ADDERALL) 20 mg tablet Take 1 tablet by mouth Two (2) times a day. 60 tablet 0   ??? dicyclomine (BENTYL) 10 mg capsule Take 1 capsule (10 mg total) by mouth Three (3) times a day. 270 capsule 1   ??? diphenoxylate-atropine (LOMOTIL) 2.5-0.025 mg per tablet Take 1 tablet by mouth two (2) times a day as needed for diarrhea. 30 tablet 5   ??? DULoxetine (CYMBALTA) 60 MG capsule Take 1 capsule (60 mg total) by mouth Two (2) times a day. 180 capsule 0   ??? empty container (SHARPS CONTAINER) Misc Use as directed to dispose of Humira needles 1 each 2   ??? empty container Misc Use as directed to dispose of Humira injections 1 each 2   ??? empty container Misc use as directed 1 each 3   ??? erenumab-aooe 140 mg/mL AtIn Inject 140 mg under the skin every twenty-eight (28) days. 1 mL 6   ??? famotidine (PEPCID) 20 MG tablet Take 1 tablet (20 mg total) by mouth Two (2) times a day. 180 tablet 2   ??? fluconazole (DIFLUCAN) 150 MG tablet Take 1 tablet (150 mg total) by mouth once a week. For 4 weeks. 4 tablet 0   ???  fluticasone propionate (FLONASE) 50 mcg/actuation nasal spray 1 spray into each nostril Two (2) times a day. 16 g 5   ??? gabapentin (NEURONTIN) 300 MG capsule Take 300 mg by mouth Three (3) times a day.     ??? glipiZIDE (GLUCOTROL) 10 MG tablet Take 2 tablets (20 mg total) by mouth Two (2) times a day (30 minutes before a meal). 360 tablet 1   ??? HUMALOG U-100 INSULIN 100 unit/mL injection INJECT 120 UNITS UNDER THE SKIN DAILY VIA OMNIPOD]     ??? HUMIRA PEN CITRATE FREE 40 MG/0.4 ML Inject the contents of 2 pens (80mg  total) under the skin every seven (7) days. 8 each 3   ??? hydrocortisone (PROCTOSOL HC) 2.5 % rectal cream Insert 1 application into the rectum two (2) times a day as needed. 28.35 g 2   ??? hydrOXYzine (ATARAX) 25 MG tablet Take 1 tablet (25 mg total) by mouth Three (3) times a day as needed. 120 tablet 1   ??? ibuprofen (ADVIL,MOTRIN) 800 MG tablet Take 1 tablet (800 mg total) by mouth every eight (8) hours as needed. 90 tablet 1   ??? insulin pump cartridge Crtg Inject 120 Units under the skin daily. 5 each 5   ??? ketoconazole (NIZORAL) 2 % cream Apply 1 application topically daily. 15 g 5   ??? lancets Misc Type 2 diabetes, poorly controlled 250.02   Test 2 times daily 100 each 11   ??? levothyroxine (SYNTHROID) 50 MCG tablet Take 1 tablet (50 mcg total) by mouth daily. 90 tablet 1   ??? lidocaine (XYLOCAINE) 5 % ointment lidocaine 5 % topical ointment     ??? montelukast (SINGULAIR) 10 mg tablet Take 1 tablet (10 mg total) by mouth daily. 90 each 1   ??? nystatin (MYCOSTATIN) 100,000 unit/gram powder Apply to affected area 3 times daily 30 g 1   ??? ondansetron (ZOFRAN-ODT) 4 MG disintegrating tablet Take 1 tablet (4 mg total) by mouth every eight (8) hours as needed for nausea for up to 15 doses. 15 tablet 1   ??? oxybutynin (DITROPAN) 5 MG tablet Take 1 tablet (5 mg total) by mouth Two (2) times a day. 180 tablet 1   ??? pantoprazole (PROTONIX) 40 MG tablet Take 1 tablet (40 mg total) by mouth Two (2) times a day. 180 tablet 1   ??? polyethylene glycol (GLYCOLAX) 17 gram/dose powder take 17GM (DISSOLVED IN WATER) by mouth once daily     ??? PROAIR HFA 90 mcg/actuation inhaler Inhale 2 puffs every four (4) hours as needed for wheezing. 3 Inhaler 1   ??? promethazine (PHENERGAN) 25 MG tablet Take 1-2 tablets (25-50 mg total) by mouth nightly as needed for nausea. 30 tablet 3   ??? propranoloL (INDERAL LA) 120 mg 24 hr capsule Take 1 capsule (120 mg total) by mouth daily. 90 capsule 1   ??? sour cherry extract (TART CHERRY EXTRACT) 1,000 mg cap      ??? topiramate (TOPAMAX) 50 MG tablet Take 1.5 tablets (75 mg total) by mouth Two (2) times a day. 270 tablet 1   ??? traMADoL (ULTRAM) 50 mg tablet tramadol 50 mg tablet   TAKE ONE TABLET BY MOUTH EVERY 8 HOURS AS NEEDED FOR UP TO 5 DAYS     ??? traZODone (DESYREL) 100 MG tablet Take 2 tablets (200 mg total) by mouth nightly. 180 tablet 0     No current facility-administered medications for this visit.       Health Maintenance:  Health Maintenance   Topic Date Due   ??? COVID-19 Vaccine (1) Never done   ??? Urine Albumin/Creatinine Ratio  09/16/2020   ??? Hemoglobin A1c  10/02/2020   ??? Foot Exam  11/17/2020   ??? Serum Creatinine Monitoring  07/04/2021   ??? Potassium Monitoring  07/04/2021   ??? Retinal Eye Exam  09/20/2021   ??? DTaP/Tdap/Td Vaccines (4 - Td or Tdap) 07/17/2023   ??? COPD Spirometry  05/25/2025   ??? Hepatitis C Screen  Completed   ??? Influenza Vaccine  Completed   ??? Pneumococcal Vaccine  Completed       Immunizations:     Immunization History   Administered Date(s) Administered   ??? Hepatitis B Vaccine, Unspecified Formulation 12/27/1999, 04/29/2000   ??? Hepatitis B, Adult 03/05/2013, 04/05/2013, 09/03/2013   ??? INFLUENZA INJ MDCK PF, QUAD,(FLUCELVAX)(37MO AND UP EGG FREE) 03/31/2019   ??? INFLUENZA TIV (TRI) PF (IM) 10/08/2011   ??? Influenza Vaccine Quad (IIV4 PF) 96mo+ injectable 03/05/2013, 03/18/2014, 03/22/2015, 03/05/2016, 03/19/2017, 02/23/2018   ??? Influenza Virus Vaccine, unspecified formulation 03/19/2017, 02/23/2018, 03/25/2019, 03/10/2020   ??? PNEUMOCOCCAL POLYSACCHARIDE 23 12/30/2012   ??? PPD Test 10/28/2016   ??? TdaP 07/18/2008, 07/16/2013   ??? Tetanus-Diptheria Toxoids-TD(TDVAX),Asdorbed,2LF(IM) 04/29/2000     I have reviewed and (if needed) updated the patient's problem list, medications, allergies, past medical and surgical history, social and family history.     Vital Signs:     Wt Readings from Last 3 Encounters:   08/11/20 (!) 227.7 kg (502 lb)   07/04/20 (!) 230.9 kg (509 lb)   02/21/20 (!) 232.2 kg (512 lb)     Temp Readings from Last 3 Encounters:   08/11/20 36.8 ??C (98.3 ??F) (Oral)   07/04/20 36.8 ??C (98.2 ??F) (Oral)   02/21/20 36.7 ??C (98.1 ??F) (Oral)     BP Readings from Last 3 Encounters:   08/11/20 130/72   07/04/20 130/80   02/21/20 140/70     Pulse Readings from Last 3 Encounters:   08/11/20 72   07/04/20 77   02/21/20 74     Estimated body mass index is 70.01 kg/m?? as calculated from the following:    Height as of 08/11/20: 180.3 cm (5' 11).    Weight as of 08/11/20: 227.7 kg (502 lb).  No height and weight on file for this encounter.      Objective:      General: Alert and oriented x3. Well-appearing. No acute distress. ***  HEENT:  Normocephalic.  Atraumatic. Conjunctiva and sclera normal. OP MMM without lesions. ***  Neck:  Supple. No thyroid enlargement. No adenopathy. ***  Heart:  Regular rate and rhythm . Normal S1, S2.  No murmurs, rubs or gallops. ***  Lungs:  No respiratory distress.  Lungs clear to auscultation. No wheezes, rhonchi, or rales. ***  GI/GU:  Soft, +BS, nondistended, non-TTP. No palpable masses or organomegaly. ***  Extremities:  No edema. Peripheral pulses normal. ***  Skin:  Warm, dry. No rash or lesions present. ***  Neuro:  Non-focal. No obvious weakness. ***  Psych:  Affect normal, eye contact good, speech clear and coherent. ***     I attest that I, Bayard Hugger, personally documented this note while acting as scribe for [name], FNP.      Bayard Hugger, Scribe.  10/02/2020     The documentation recorded by the scribe accurately reflects the service I personally performed and the decisions made by me.    Ernst Spell  Ninetta Lights, FNP

## 2020-10-05 NOTE — Unmapped (Signed)
[facility] Refill Coordination Note    Specialty Medication(s) to be Shipped:   Inflammatory Disorders: Humira    Other medication(s) to be shipped: No additional medications requested for fill at this time     Logan Quinn, DOB: Aug 30, 1980  Phone: [phone] (home)       All above HIPAA information was verified with patient.     Was a Nurse, learning disability used for this call? No    Completed refill call assessment today to schedule patient's medication shipment from the [facility] [phone]).  All relevant notes have been reviewed.     Specialty medication(s) and dose(s) confirmed: Regimen is correct and unchanged.   Changes to medications: Voyd reports no changes at this time.  Changes to insurance: No  New side effects reported not previously addressed with a pharmacist or physician: None reported  Questions for the pharmacist: No    Confirmed patient received a Conservation officer, historic buildings and a Surveyor, mining with first shipment. The patient will receive a drug information handout for each medication shipped and additional FDA Medication Guides as required.       DISEASE/MEDICATION-SPECIFIC INFORMATION        For patients on injectable medications: Patient currently has 1 doses left.  Next injection is scheduled for 10/05/2020.    SPECIALTY MEDICATION ADHERENCE     Medication Adherence    Patient reported X missed doses in the last month: 0  Specialty Medication: Humira CF 40 mg/0.4 ml  Patient is on additional specialty medications: No  Any gaps in refill history greater than 2 weeks in the last 3 months: no  Demonstrates understanding of importance of adherence: yes  Informant: patient  Reliability of informant: reliable  Confirmed plan for next specialty medication refill: delivery by pharmacy  Refills needed for supportive medications: not needed              Were doses missed due to medication being on hold? No    Humira CF 40 mg/0.4 ml  mg/ml: 7 days of medicine on hand       REFERRAL TO PHARMACIST     Referral to the pharmacist: Not needed      [facility]     Shipping address confirmed in Epic.     Delivery Scheduled: Yes, Expected medication delivery date: 10/06/2020.     Medication will be delivered via Same Day Courier to the prescription address in Epic WAM.    Logan Quinn   [facility] Specialty Technician

## 2020-10-06 MED FILL — HUMIRA PEN CITRATE FREE 40 MG/0.4 ML: 28 days supply | Qty: 8 | Fill #1

## 2020-10-06 NOTE — Unmapped (Signed)
Assessment and Plan:     Logan Quinn was seen today for diabetes.    Diagnoses and all orders for this visit:    Type 2 diabetes mellitus with hyperglycemia, with long-term current use of insulin (CMS-HCC)  HGB A1c 11.7 (up from 8.9 three months ago).   DM uncontrolled. Discontinuation of medication for 2-3 week period contributory. Patient has now resumed medications.   Continue glipizide 20 mg BID, use of OmniPod with Humalog.  Discussed additional agent, Trulicity.   Will research use of Trulicity in conjunction with OmniPod and direct treatment as appropriate.   Advised to continue carb controlled diet. Encouraged regular exercise.    -     POCT glycosylated hemoglobin (Hb A1C); Future  -     Albumin/creatinine urine ratio; Future  -     POCT glycosylated hemoglobin (Hb A1C)  -     Albumin/creatinine urine ratio    Essential hypertension  BP at goal (130/68 in clinic today). Continue propranolol 120 mg daily. Reviewed low sodium diet and encouraged regular exercise. Advised to continue to monitor and log at-home BP readings.    Chronic bronchitis, unspecified chronic bronchitis type (CMS-HCC)  Overall stable. Continue daily maintenance with Advair and rescue inhaler, albuterol, prn.    Recurrent major depressive disorder, in partial remission (CMS-HCC)  Patient following closely with psychiatry, [facility].    I personally spent 35 minutes face-to-face and non-face-to-face in the care of this patient, which includes all pre, intra, and post visit time on the date of service.    Return in about 3 months (around 01/11/2021) for Next scheduled follow up.    HPI:      Logan Quinn  is here for   Chief Complaint   Patient presents with   ??? Diabetes     No concerns.     Diabetes: Patient presents for follow up of diabetes.  A1C goal is <8.  Diabetes has customarily not been at goal (complicated by: obesity, medication non-adherence). Current symptoms include: polydipsia, polyuria, yeast infections, and weight loss. Symptoms have gradually improved. Patient denies foot ulcerations, hypoglycemia , increase appetite, nausea, paresthesia of the feet, visual disturbances and vomitting. Evaluation to date has included: hemoglobin A1C. Home sugars: 400s. Current treatment: Continued glipizide and OmniPod with Humalog . OmniPod is set to 3 units every hours. He is administering bolus at sliding scale dosage according to BGs and carb intake. However, patient admits he discontinued glipizide for 2 week period, and discontinued OmniPod for 3 week period due to stress. He has since resumed medications. Doing regular exercise: no. Patient states he has been following stricter carb-controlled diet.    Patient inquires about Trulicity, noting his father had great success with Trulicity - states A1c improved greatly, as well as weight loss.     Hypertension: Patient presents for follow-up of hypertension. Blood pressure goal < 140/90.  Hypertension has customarily been at goal complicated by uncontrolled DM, obesity, medication non-adherence.  Home blood pressure readings: did not bring log. Salt intake and diet: salt not added to cooking and salt shaker not on table. Associated signs and symptoms: none. Patient denies: blurred vision, chest pain, dyspnea, headache, neck aches, orthopnea, palpitations, paroxysmal nocturnal dyspnea, peripheral edema, pulsating in the ears and tiredness/fatigue. Medication compliance: taking as prescribed. He is not doing regular exercise.      Chronic Bronchitis: Current symptoms None. Current medications: Rescue inhaler albuterol, daily maintenance Advair. He is not seeing Pulmonology.     Patient  has established with psychiatrist at [facility] ~1 month ago. He notes specialist recently changed Adderall to 20 mg XR daily (previously IR 20 mg BID) due to insomnia, as well as adjusted trazodone dosage to 100 mg HS.    He endorses significant stress recently, noting open CPS case regarding his newborn. He states CPS worker's main concern was that he and his wife were not waking when the baby cried. Per patient, they expressed concern medications were the reason they were not waking up. Therefore, patient admits he discontinued all his medications, except for inhalers, for ~2 week period as a result. He notes he discontinued OmniPod for longer, ~3 weeks, due to lack of supplies. He recently resumed medication.       ROS:      Comprehensive 10 point ROS negative unless otherwise stated in the HPI.      [facility] Components:     Medication adherence and barriers to the treatment plan have been addressed. Opportunities to optimize healthy behaviors have been discussed. Patient / caregiver voiced understanding.    Past Medical/Surgical History:     Past Medical History:   Diagnosis Date   ??? Acne    ??? Allergic    ??? Anxiety    ??? Depression    ??? Diabetes mellitus (CMS-HCC) Dx 2013    Type II   ??? GERD (gastroesophageal reflux disease)    ??? Gout    ??? Hypertension    ??? IBS (irritable bowel syndrome)    ??? 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 Liver disease    ??? Migraines    ??? Morbid obesity with BMI of 60.0-69.9, adult (CMS-HCC)    ??? 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 Obstructive sleep apnea    ??? OSA on CPAP    ??? Severe obstructive sleep apnea    ??? Trapezius muscle strain 12/07/2013   ??? Urinary incontinence, nocturnal enuresis    ??? Venous insufficiency      Past Surgical History:   Procedure Laterality Date   ??? EYE SURGERY  11/15   ??? PR COLONOSCOPY FLX DX W/COLLJ SPEC WHEN PFRMD N/A 03/24/2013    Procedure: COLONOSCOPY, FLEXIBLE, PROXIMAL TO SPLENIC FLEXURE; DIAGNOSTIC, W/WO COLLECTION SPECIMEN BY BRUSH OR WASH;  Surgeon: Clint Bolder, MD;  [facility];  Service: Gastroenterology   ??? PR EYE SURG POST SGMT PROC UNLISTED Left     pneumatic retinopexy OS   ??? PR UPPER GI ENDOSCOPY,DIAGNOSIS N/A 02/02/2013    Procedure: UGI ENDO, INCLUDE ESOPHAGUS, STOMACH, & DUODENUM &/OR JEJUNUM; DX W/WO COLLECTION SPECIMN, BY BRUSH OR WASH;  Surgeon: Malcolm Metro, MD;  Location: [facility];  Service: Gastroenterology   ??? [place] PYLORIC STENOSIS ([facility] HISTORICAL RESULT)         Family History:     Family History   Problem Relation Age of Onset   ??? Cancer Maternal Grandfather    ??? Hearing loss Maternal Grandfather    ??? Cancer Paternal Grandfather    ??? COPD Paternal Grandmother    ??? Arthritis Paternal Grandmother    ??? Depression Paternal Grandmother    ??? Diabetes Mother    ??? Heart disease Mother    ??? Migraines Mother    ??? Arthritis Mother    ??? Depression Mother    ??? GER disease Mother    ??? Hypertension Mother    ??? Angina Mother    ??? COPD Mother    ???  Glaucoma Mother    ??? Hearing loss Mother    ??? Diabetes Sister    ??? Migraines Sister    ??? Asthma Sister    ??? Depression Sister    ??? Hearing loss Sister    ??? Diabetes Brother    ??? Asthma Brother    ??? Diabetes Maternal Grandmother    ??? Heart disease Maternal Grandmother    ??? Migraines Maternal Grandmother    ??? Depression Maternal Grandmother    ??? Angina Maternal Grandmother    ??? Hypertension Maternal Grandmother    ??? Stroke Maternal Grandmother    ??? Diabetes Maternal Uncle    ??? Hearing loss Maternal Uncle    ??? Diabetes Maternal Uncle         resulted in need for kidney transplant   ??? Liver disease Maternal Uncle    ??? Kidney disease Maternal Uncle         needed kidney transplant   ??? Asthma Brother    ??? No Known Problems Father    ??? No Known Problems Paternal Aunt    ??? No Known Problems Paternal Uncle    ??? No Known Problems Other    ??? Colorectal Cancer Neg Hx    ??? Esophageal cancer Neg Hx    ??? Liver cancer Neg Hx    ??? Pancreatic cancer Neg Hx    ??? Stomach cancer Neg Hx    ??? Amblyopia Neg Hx    ??? Blindness Neg Hx    ??? Retinal detachment Neg Hx    ??? Strabismus Neg Hx    ??? Macular degeneration Neg Hx    ??? Anesthesia problems Neg Hx    ??? Broken bones Neg Hx    ??? Clotting disorder Neg Hx    ??? Collagen disease Neg Hx    ??? Dislocations Neg Hx    ??? Fibromyalgia Neg Hx    ??? Gout Neg Hx    ??? Hemophilia Neg Hx    ??? Osteoporosis Neg Hx    ??? Rheumatologic disease Neg Hx    ??? Scoliosis Neg Hx    ??? Severe sprains Neg Hx    ??? Sickle cell anemia Neg Hx    ??? Spinal Compression Fracture Neg Hx    ??? Melanoma Neg Hx    ??? Basal cell carcinoma Neg Hx    ??? Squamous cell carcinoma Neg Hx    ??? Deep vein thrombosis Neg Hx    ??? Cataracts Neg Hx    ??? Thyroid disease Neg Hx        Social History:     Social History     Tobacco Use   ??? Smoking status: Never Smoker   ??? Smokeless tobacco: Never Used   Vaping Use   ??? Vaping Use: Never used   Substance Use Topics   ??? Alcohol use: Never     Alcohol/week: 0.0 standard drinks     Comment: rare social   ??? Drug use: Never       Allergies:     Erythromycin, Penicillins, Sulfa (sulfonamide antibiotics), Other, Erythromycin base, and Sulfasalazine    Current Medications:     Current Outpatient Medications   Medication Sig Dispense Refill   ??? ADVAIR DISKUS 500-50 mcg/dose diskus Inhale 1 puff 2 (two) times a day. 60 each 11   ??? albuterol 2.5 mg /3 mL (0.083 %) nebulizer solution Inhale 3 mL (2.5 mg total) by nebulization every four (4) hours as needed for  wheezing. 180 mL 2   ??? albuterol HFA 90 mcg/actuation inhaler Inhale 2 puffs every six (6) hours as needed for wheezing. 8 g 0   ??? ALPRAZolam (XANAX) 0.5 MG tablet Take 1 tablet (0.5 mg total) by mouth Three (3) times a day. 90 tablet 1   ??? blood sugar diagnostic (ACCU-CHEK GUIDE TEST STRIPS) Strp Accu-Chek Guide test strips     ??? blood sugar diagnostic Strp by Other route Three (3) times a day before meals. 300 each 1   ??? cetirizine (ZYRTEC) 10 MG tablet Take 1 tablet (10 mg total) by mouth nightly as needed for allergies. 90 tablet 1   ??? clindamycin (CLEOCIN T) 1 % external solution clindamycin phosphate 1 % topical solution   APPLY TOPICALLY TO GROIN AREA TWICE A DAY     ??? clindamycin (CLEOCIN T) 1 % lotion APPLY TOPICALLY AT NIGHT TO INFLAMED AREAS AS NEEDED FOR FLARES     ??? clobetasoL (TEMOVATE) 0.05 % ointment Apply daily to painful affected areas if needed for flares, then stop 15 g 1   ??? colchicine (MITIGARE) 0.6 mg cap capsule Take 1.2 mg on Day 1 of flare, followed by 0.6 mg one hour later. Day 2 take 0.6 mg BID until flare resolves. 60 capsule 3   ??? cyclobenzaprine (FLEXERIL) 10 MG tablet Take 1 tablet (10 mg total) by mouth Three (3) times a day as needed for muscle spasms. 60 tablet 0   ??? dextroamphetamine-amphetamine (ADDERALL XR) 20 MG 24 hr capsule Take 1 capsule by mouth every morning.     ??? dicyclomine (BENTYL) 10 mg capsule Take 1 capsule (10 mg total) by mouth Three (3) times a day. 270 capsule 1   ??? diphenoxylate-atropine (LOMOTIL) 2.5-0.025 mg per tablet Take 1 tablet by mouth two (2) times a day as needed for diarrhea. 30 tablet 5   ??? DULoxetine (CYMBALTA) 60 MG capsule Take 1 capsule (60 mg total) by mouth Two (2) times a day. 180 capsule 0   ??? empty container (SHARPS CONTAINER) Misc Use as directed to dispose of Humira needles 1 each 2   ??? erenumab-aooe 140 mg/mL AtIn Inject 140 mg under the skin every twenty-eight (28) days. 1 mL 6   ??? famotidine (PEPCID) 20 MG tablet Take 1 tablet (20 mg total) by mouth Two (2) times a day. 180 tablet 2   ??? fluconazole (DIFLUCAN) 150 MG tablet Take 1 tablet (150 mg total) by mouth once a week. For 4 weeks. 4 tablet 0   ??? fluticasone propionate (FLONASE) 50 mcg/actuation nasal spray 1 spray into each nostril Two (2) times a day. 16 g 5   ??? gabapentin (NEURONTIN) 300 MG capsule Take 300 mg by mouth Three (3) times a day.     ??? glipiZIDE (GLUCOTROL) 10 MG tablet Take 2 tablets (20 mg total) by mouth Two (2) times a day (30 minutes before a meal). 360 tablet 1   ??? HUMALOG U-100 INSULIN 100 unit/mL injection INJECT 120 UNITS UNDER THE SKIN DAILY VIA OMNIPOD]     ??? HUMIRA PEN CITRATE FREE 40 MG/0.4 ML Inject the contents of 2 pens (80mg  total) under the skin every seven (7) days. 8 each 3   ??? hydrocortisone (PROCTOSOL HC) 2.5 % rectal cream Insert 1 application into the rectum two (2) times a day as needed. 28.35 g 2   ??? hydrOXYzine (ATARAX) 25 MG tablet Take 1 tablet (25 mg total) by mouth Three (3) times a day as needed.  120 tablet 1   ??? insulin pump cartridge Crtg Inject 120 Units under the skin daily. 5 each 5   ??? ketoconazole (NIZORAL) 2 % cream Apply 1 application topically daily. 15 g 5   ??? lancets (ACCU-CHEK FASTCLIX LANCET DRUM) Misc Accu-Chek Fastclix Lancet Drum   USE TO CHECK BLOOD SUGAR 3 TIMES A DAY before meals     ??? lancets Misc Type 2 diabetes, poorly controlled 250.02   Test 2 times daily 100 each 11   ??? levothyroxine (SYNTHROID) 50 MCG tablet Take 1 tablet (50 mcg total) by mouth daily. 90 tablet 1   ??? lidocaine (XYLOCAINE) 5 % ointment lidocaine 5 % topical ointment     ??? montelukast (SINGULAIR) 10 mg tablet Take 1 tablet (10 mg total) by mouth daily. 90 each 1   ??? nystatin (MYCOSTATIN) 100,000 unit/gram powder Apply to affected area 3 times daily 30 g 1   ??? ondansetron (ZOFRAN-ODT) 4 MG disintegrating tablet Take 1 tablet (4 mg total) by mouth every eight (8) hours as needed for nausea for up to 15 doses. 15 tablet 1   ??? oxybutynin (DITROPAN) 5 MG tablet Take 1 tablet (5 mg total) by mouth Two (2) times a day. 180 tablet 1   ??? pantoprazole (PROTONIX) 40 MG tablet Take 1 tablet (40 mg total) by mouth Two (2) times a day. 180 tablet 1   ??? polyethylene glycol (GLYCOLAX) 17 gram/dose powder take 17GM (DISSOLVED IN WATER) by mouth once daily     ??? PROAIR HFA 90 mcg/actuation inhaler Inhale 2 puffs every four (4) hours as needed for wheezing. 3 Inhaler 1   ??? promethazine (PHENERGAN) 25 MG tablet Take 1-2 tablets (25-50 mg total) by mouth nightly as needed for nausea. 30 tablet 3   ??? propranoloL (INDERAL LA) 120 mg 24 hr capsule Take 1 capsule (120 mg total) by mouth daily. 90 capsule 1   ??? sour cherry extract (TART CHERRY EXTRACT) 1,000 mg cap      ??? topiramate (TOPAMAX) 50 MG tablet Take 1.5 tablets (75 mg total) by mouth Two (2) times a day. 270 tablet 1   ??? traZODone (DESYREL) 100 MG tablet Take 2 tablets (200 mg total) by mouth nightly. (Patient taking differently: Take 100 mg by mouth nightly. ) 180 tablet 0   ??? acetaminophen (TYLENOL) 500 MG tablet Take 2 tablets (1,000 mg total) by mouth Three (3) times a day. (Patient not taking: Reported on 10/11/2020) 30 tablet 0   ??? blood-glucose meter kit Use as directed 1 each 0   ??? ibuprofen (ADVIL,MOTRIN) 800 MG tablet Take 1 tablet (800 mg total) by mouth every eight (8) hours as needed. (Patient not taking: Reported on 10/11/2020) 90 tablet 1   ??? insulin syringe-needle U-100 1 mL 31 gauge x 5/16 (8 mm) Syrg insulin syringe U-100 with needle 1 mL 31 gauge x 5/16   USE UP TO 5 TIMES A DAY TO ADMINISTER INSULIN       No current facility-administered medications for this visit.       Health Maintenance:     Health Maintenance   Topic Date Due   ??? COVID-19 Vaccine (1) Never done   ??? Urine Albumin/Creatinine Ratio  09/16/2020   ??? Foot Exam  11/17/2020   ??? Hemoglobin A1c  01/11/2021   ??? Serum Creatinine Monitoring  07/04/2021   ??? Potassium Monitoring  07/04/2021   ??? Retinal Eye Exam  09/20/2021   ??? DTaP/Tdap/Td Vaccines (4 - Td  or Tdap) 07/17/2023   ??? COPD Spirometry  05/25/2025   ??? Hepatitis C Screen  Completed   ??? Influenza Vaccine  Completed   ??? Pneumococcal Vaccine  Completed       Immunizations:     Immunization History   Administered Date(s) Administered   ??? Hepatitis B Vaccine, Unspecified Formulation 12/27/1999, 04/29/2000   ??? Hepatitis B, Adult 03/05/2013, 04/05/2013, 09/03/2013   ??? INFLUENZA INJ MDCK PF, QUAD,(FLUCELVAX)(89MO AND UP EGG FREE) 03/31/2019   ??? INFLUENZA TIV (TRI) PF (IM) 10/08/2011   ??? Influenza Vaccine Quad (IIV4 PF) 25mo+ injectable 03/05/2013, 03/18/2014, 03/22/2015, 03/05/2016, 03/19/2017, 02/23/2018   ??? Influenza Virus Vaccine, unspecified formulation 03/19/2017, 02/23/2018, 03/25/2019, 03/10/2020   ??? PNEUMOCOCCAL POLYSACCHARIDE 23 12/30/2012   ??? PPD Test 10/28/2016   ??? TdaP 07/18/2008, 07/16/2013   ??? Tetanus-Diptheria Toxoids-TD(TDVAX),Asdorbed,2LF(IM) 04/29/2000     I have reviewed and (if needed) updated the patient's problem list, medications, allergies, past medical and surgical history, social and family history.     Vital Signs:     Wt Readings from Last 3 Encounters:   10/11/20 (!) 223.2 kg (492 lb)   08/11/20 (!) 227.7 kg (502 lb)   07/04/20 (!) 230.9 kg (509 lb)     Temp Readings from Last 3 Encounters:   10/11/20 36.8 ??C (98.2 ??F) (Oral)   08/11/20 36.8 ??C (98.3 ??F) (Oral)   07/04/20 36.8 ??C (98.2 ??F) (Oral)     BP Readings from Last 3 Encounters:   10/11/20 130/68   08/11/20 130/72   07/04/20 130/80     Pulse Readings from Last 3 Encounters:   10/11/20 78   08/11/20 72   07/04/20 77     Estimated body mass index is 68.62 kg/m?? as calculated from the following:    Height as of this encounter: 180.3 cm (5' 11).    Weight as of this encounter: 223.2 kg (492 lb).  Facility age limit for growth percentiles is 20 years.      Objective:      General: Alert and oriented x3. Well-appearing. No acute distress. Morbidly obese.  HEENT:  Normocephalic.  Atraumatic. Conjunctiva and sclera normal. OP MMM without lesions.   Neck:  Supple. No thyroid enlargement. No adenopathy.   Heart:  Regular rate and rhythm . Normal S1, S2.  No murmurs, rubs or gallops.   Lungs:  No respiratory distress.  Lungs clear to auscultation. No wheezes, rhonchi, or rales.   GI/GU:  Soft, +BS, nondistended, non-TTP.  Extremities:  No edema. Peripheral pulses normal.   Skin:  Warm, dry. No rash or lesions present.   Neuro:  Non-focal. No obvious weakness.   Psych:  Affect normal, eye contact good, speech clear and coherent.       I attest that I, Bayard Hugger, personally documented this note while acting as scribe for [name], FNP.      Bayard Hugger, Scribe.  10/11/2020     The documentation recorded by the scribe accurately reflects the service I personally performed and the decisions made by me.    [name], FNP

## 2020-10-06 NOTE — Unmapped (Signed)
This encounter was created in error - please disregard.

## 2020-10-11 ENCOUNTER — Encounter: Admit: 2020-10-11 | Discharge: 2020-10-12 | Payer: MEDICAID | Attending: Family | Primary: Family

## 2020-10-11 ENCOUNTER — Encounter: Admit: 2020-10-11 | Discharge: 2020-10-12 | Payer: MEDICAID

## 2020-10-11 DIAGNOSIS — E1165 Type 2 diabetes mellitus with hyperglycemia: Principal | ICD-10-CM

## 2020-10-11 DIAGNOSIS — J42 Unspecified chronic bronchitis: Principal | ICD-10-CM

## 2020-10-11 DIAGNOSIS — F3341 Major depressive disorder, recurrent, in partial remission: Principal | ICD-10-CM

## 2020-10-11 DIAGNOSIS — Z794 Long term (current) use of insulin: Principal | ICD-10-CM

## 2020-10-11 DIAGNOSIS — I10 Essential (primary) hypertension: Principal | ICD-10-CM

## 2020-10-11 LAB — ALBUMIN / CREATININE URINE RATIO
ALBUMIN QUANT URINE: <0.3 mg/dL
CREATININE, URINE: 39 mg/dL

## 2020-10-11 NOTE — Unmapped (Addendum)
Patient Education        dulaglutide  Pronunciation:  DOO la GLOO tide  Brand:  Trulicity Pen  What is the most important information I should know about dulaglutide?  You should not use dulaglutide if you have Multiple Endocrine Neoplasia syndrome type 2 (MEN 2), or a personal or family history of medullary thyroid carcinoma (a type of thyroid cancer). Do not use dulaglutide if you are in a state of diabetic ketoacidosis (call your doctor for treatment).  In animal studies, dulaglutide caused thyroid tumors or thyroid cancer.  It is not known whether these effects would occur in people using regular doses. Ask your doctor about your risk.  Call your doctor at once if you have signs of a thyroid tumor, such as swelling or a lump in your neck, trouble swallowing, a hoarse voice, or shortness of breath.  What is dulaglutide?  Dulaglutide is used together with diet and exercise to improve blood sugar control in adults with type 2 diabetes mellitus.  Dulaglutide is also used to help reduce the risk of serious heart problems such as heart attack or stroke in adults who have type 2 diabetes and heart disease.  This medicine is not for treating type 1 diabetes.   Dulaglutide may also be used for purposes not listed in this medication guide.  What should I discuss with my healthcare provider before using dulaglutide?  You should not use dulaglutide if you are allergic to it, or if you have:  ?? multiple endocrine neoplasia type 2 (tumors in your glands);  ?? a personal or family history of medullary thyroid carcinoma (a type of thyroid cancer); or  ?? diabetic ketoacidosis (call your doctor for treatment).  Tell your doctor if you have ever had:  ?? pancreatitis;  ?? a stomach or intestinal disorder;  ?? gastroesophageal reflux disease (GERD) or slow digestion;  ?? eye problems caused by diabetes (retinopathy);  ?? liver or kidney disease;  ?? if you also use insulin or oral diabetes medicine; or  ?? if you have been sick with vomiting or diarrhea.  In animal studies, dulaglutide caused thyroid tumors or thyroid cancer.  It is not known whether these effects would occur in people using regular doses. Ask your doctor about your risk.  It is not known whether this medicine will harm an unborn baby. Tell your doctor if you are pregnant or plan to become pregnant.  It may not be safe to breast-feed while using this medicine. Ask your doctor about any risk.  Dulaglutide is not approved for use by anyone younger than 39 years old.  How should I use dulaglutide?  Follow all directions on your prescription label and read all medication guides or instruction sheets. Your doctor may occasionally change your dose. Use the medicine exactly as directed.  Dulaglutide is injected under the skin once per week. Use dulaglutide on the same day each week at the same time of day. If you change your dosing day, allow at least 3 days to pass between doses.  You may use dulaglutide with or without food.  Read and carefully follow any Instructions for Use provided with your medicine. Ask your doctor or pharmacist if you do not understand these instructions.  Your healthcare provider will show you where on your body to inject dulaglutide. Use a different place each time you give an injection. Do not inject into the same place two times in a row.  You may have low blood sugar (hypoglycemia) and  feel very hungry, dizzy, irritable, confused, anxious, or shaky. To quickly treat hypoglycemia, eat or drink a fast-acting source of sugar (fruit juice, hard candy, crackers, raisins, or non-diet soda).  Your doctor may prescribe a glucagon injection kit in case you have severe hypoglycemia. Be sure your family or close friends know how to give you this injection in an emergency.  Also watch for signs of high blood sugar (hyperglycemia) such as increased thirst or urination.  Blood sugar levels can be affected by stress, illness, surgery, exercise, alcohol use, or skipping meals. Ask your doctor before changing your dose or medication schedule.  Each injection pen or prefilled syringe is for one use only. Throw away after one use, even if there is still medicine left inside. Use a puncture-proof sharps container. Follow state or local laws about how to dispose of this container. Keep it out of the reach of children and pets.  Store dulaglutide in the refrigerator, protected from light. Do not use past the expiration date on the medicine label. Do not freeze dulaglutide, and throw away the medicine if it has become frozen.  You may also store dulaglutide at room temperature for up to 14 days before use.  What happens if I miss a dose?  Use the medicine as soon as you can, but skip the missed dose if your next dose is due in less than 3 days. Do not use two doses at one time.  Do not use this medicine twice within a 72-hour period.  What happens if I overdose?  Seek emergency medical attention or call the Poison Help line at [phone].  What should I avoid while using dulaglutide?  Never share an injection pen or prefilled syringe with another person, even if the needle has been changed. Sharing these devices can allow infections or disease to pass from one person to another.  What are the possible side effects of dulaglutide?  Stop using dulaglutide and get emergency medical help if you have signs of an allergic reaction: hives; difficult breathing; feeling light-headed; swelling of your face, lips, tongue, or throat.  Call your doctor at once if you have:  ?? pancreatitis --severe pain in your upper stomach spreading to your back, nausea and vomiting;  ?? signs of a thyroid tumor --swelling or a lump in your neck, trouble swallowing, a hoarse voice, or if you feel short of breath;  ?? low blood sugar --headache, hunger, weakness, sweating, confusion, irritability, dizziness, fast heart rate, or feeling jittery; or  ?? kidney problems --little or no urination, swelling in your feet or ankles, feeling tired or short of breath.  Tell your doctor if you are sick with vomiting or diarrhea, or if you are sweating more than usual. You can easily become dehydrated while using dulaglutide. This can lead to kidney failure.  Common side effects may include:  ?? nausea, vomiting, stomach pain;  ?? diarrhea; or  ?? loss of appetite.  This is not a complete list of side effects and others may occur. Call your doctor for medical advice about side effects. You may report side effects to FDA at 1-800-FDA-1088.  What other drugs will affect dulaglutide?  Dulaglutide can slow your digestion, and it may take longer for your body to absorb any medicines you take by mouth.  Other drugs may affect dulaglutide, including prescription and over-the-counter medicines, vitamins, and herbal products. Tell your doctor about all your current medicines and any medicine you start or stop using.  Where can I  get more information?  Your pharmacist can provide more information about dulaglutide.  Remember, keep this and all other medicines out of the reach of children, never share your medicines with others, and use this medication only for the indication prescribed.   Every effort has been made to ensure that the information provided by [employer] ('[employer]') is accurate, up-to-date, and complete, but no guarantee is made to that effect. Drug information contained herein may be time sensitive. [employer] information has been compiled for use by healthcare practitioners and consumers in the [place] and therefore [employer] does not warrant that uses outside of the [place] are appropriate, unless specifically indicated otherwise. [employer]'s drug information does not endorse drugs, diagnose patients or recommend therapy. [employer]'s drug information is an Investment banker, corporate to assist licensed healthcare practitioners in caring for their patients and/or to serve consumers viewing this service as a supplement to, and not a substitute for, the expertise, skill, knowledge and judgment of healthcare practitioners. The absence of a warning for a given drug or drug combination in no way should be construed to indicate that the drug or drug combination is safe, effective or appropriate for any given patient. [employer] does not assume any responsibility for any aspect of healthcare administered with the aid of information [employer] provides. The information contained herein is not intended to cover all possible uses, directions, precautions, warnings, drug interactions, allergic reactions, or adverse effects. If you have questions about the drugs you are taking, check with your doctor, nurse or pharmacist.  Copyright [phone] [employer] Version: 4.02. Revision date: 03/18/2019.  Care instructions adapted under license by [facility]. If you have questions about a medical condition or this instruction, always ask your healthcare professional. [employer] disclaims any warranty or liability for your use of this information.

## 2020-10-12 DIAGNOSIS — Z794 Long term (current) use of insulin: Principal | ICD-10-CM

## 2020-10-12 DIAGNOSIS — R197 Diarrhea, unspecified: Principal | ICD-10-CM

## 2020-10-12 DIAGNOSIS — E1165 Type 2 diabetes mellitus with hyperglycemia: Principal | ICD-10-CM

## 2020-10-12 MED ORDER — HUMALOG U-100 INSULIN 100 UNIT/ML SUBCUTANEOUS SOLUTION
0 refills | 0 days | Status: CP
Start: 2020-10-12 — End: ?

## 2020-10-12 MED ORDER — DIPHENOXYLATE-ATROPINE 2.5 MG-0.025 MG TABLET
ORAL_TABLET | Freq: Two times a day (BID) | ORAL | 5 refills | 15 days | Status: CP | PRN
Start: 2020-10-12 — End: 2021-10-12

## 2020-10-12 NOTE — Unmapped (Signed)
Patient is requesting the following refill  Requested Prescriptions      No prescriptions requested or ordered in this encounter       Recent Visits  Date Type Provider Dept   10/11/20 Office Visit [name] Rosita Fire, FNP [place] [facility]   08/11/20 Office Visit [name] Rosita Fire, FNP [place] [facility]   07/04/20 Office Visit [name] Rosita Fire, FNP [place] [facility]   02/21/20 Office Visit [name] Rosita Fire, FNP [place] [facility]   11/18/19 Office Visit [name] Rosita Fire, FNP  [facility] At [facility]   Showing recent visits within past 365 days with a meds authorizing provider and meeting all other requirements  Future Appointments  Date Type Provider Dept   01/09/21 Appointment [name] Rosita Fire, FNP  [facility] At [facility]   Showing future appointments within next 365 days with a meds authorizing provider and meeting all other requirements       Labs:   A1c:   Hemoglobin A1C (%)   Date Value   10/11/2020 11.7 (A)   09/15/2014 6.0

## 2020-10-12 NOTE — Unmapped (Signed)
Addended by: Wyonia Hough on: 10/12/2020 03:35 PM     Modules accepted: Orders

## 2020-10-13 NOTE — Unmapped (Signed)
Addended by: [name] on: 10/12/2020 05:55 PM     Modules accepted: Orders

## 2020-10-16 DIAGNOSIS — E1165 Type 2 diabetes mellitus with hyperglycemia: Principal | ICD-10-CM

## 2020-10-16 DIAGNOSIS — Z794 Long term (current) use of insulin: Principal | ICD-10-CM

## 2020-10-16 MED ORDER — HUMALOG U-100 INSULIN 100 UNIT/ML SUBCUTANEOUS SOLUTION
5 refills | 0 days | Status: CP
Start: 2020-10-16 — End: ?

## 2020-10-16 NOTE — Unmapped (Signed)
Error

## 2020-10-16 NOTE — Unmapped (Signed)
Resent for 40 ml and 5 refills and left message at [facility].  Patient is requesting the following refill  Requested Prescriptions     Signed Prescriptions Disp Refills   ??? HUMALOG U-100 INSULIN 100 unit/mL injection 40 mL 5     Sig: Inject 120 units under the skin daily via Omnipod     Authorizing Provider: Loran Senters     Ordering User: Wyonia Hough       Recent Visits  Date Type Provider Dept   10/11/20 Office Visit [name] Rosita Fire, FNP [place] [facility]   08/11/20 Office Visit [name] Rosita Fire, FNP [place] [facility]   07/04/20 Office Visit [name] Rosita Fire, FNP [place] [facility]   02/21/20 Office Visit [name] Rosita Fire, FNP [place] [facility]   11/18/19 Office Visit [name] Rosita Fire, FNP [place] [facility]   Showing recent visits within past 365 days with a meds authorizing provider and meeting all other requirements  Future Appointments  Date Type Provider Dept   01/09/21 Appointment [name] Rosita Fire, FNP [place] [facility]   Showing future appointments within next 365 days with a meds authorizing provider and meeting all other requirements       Labs:   A1c:   Hemoglobin A1C (%)   Date Value   10/11/2020 11.7 (A)   09/15/2014 6.0

## 2020-10-16 NOTE — Unmapped (Signed)
Renee with [facility] pharmacy left message  Humalog 10 ml only last him 8 days.

## 2020-10-19 NOTE — Unmapped (Signed)
[name] wants to know what you found out about taking Trulicity with OmniPod?

## 2020-10-23 NOTE — Unmapped (Signed)
Dermatology Note     Assessment and Plan:      Hidradenitis suppurativa, Hurley stage II, currently flaring  - chronic illness with exacerbation   - s/p unroofing of right scrotum  - Continue humira 80mg  weekly R/B/A reviewed including but not limited to risk infection.   - Continue clindamycin lotion topically nightly to the flaring lesions  - Can consider remicade in the future. He recently restarted 80mg  humira weekly so would like to give this more time to work first.   - Will treat acute flare with ILK today, as below:    Intralesional Kenalog Procedure Note: After the patient was informed of risks (including atrophy and dyspigmentation), benefits and side effects of intralesional steroid injection, the patient elected to undergo injection and verbal consent was obtained. Skin was cleaned with alcohol and injected intralesionally into the sites (below). The patient tolerated the procedure well without complications and was instructed on post-procedure care.  Location(s): right mons  Number of sites treated: 1  Kenalog (triamcinolone) Concentration: 20 mg/ml   Volume: 0.1 ml total    High Risk Medication Use (Humira)  - Will re-check quant gold today    Intertrigo  Chronic problem with exacerbation    - this is a chronic illness that is expected to last greater than 1 year and possibly the life of the patient.   - Start ketoconazole cream- apply to the affected area of the groin daily until clear  - Recommend using zinc oxide barrier paste daily to prevent irritation while HS is flaring and draining.    The patient was advised to call for an appointment should any new, changing, or symptomatic lesions develop.     This visit was billed based on medical decision making as detailed below:   Problem: 1 or more chronic illnesses with exacerbation, progression, or side effects of treatment (moderate)   Data: (Straightforward) Minimal or none   Management: Prescription drug management (moderate)       RTC: Return in about 6 months (around 04/27/2021) for Recheck. or sooner as needed   _________________________________________________________________      Chief Complaint     Chief Complaint   Patient presents with   ??? Hidradenitis     pt c/o active flare in the groin area at this time, pt needs refill on humira       HPI     Logan Quinn is a 40 y.o. male who presents as a returning patient (last seen No previous visit found) to [facility] for follow up of HS.     LV: 06/16/19   Hidradenitis suppurativa, Doreene Adas stage II, currently flaring  - chronic illness with exacerbation   - s/p unroofing of right scrotum, with new flare just proximal to surgical site   - Continue humira 80mg  weekly R/B/A reviewed including but not limited to risk infection.   - Continue keflex 500mg  QID, discussed we may try to come off of this in the near future   - continue clindamycin lotion topically nightly to the flaring lesions  - Consider remicade infusion given flares on 80mg  weekly of humira. R/B/A reviewed including but not limited to risk infection, infusion site reaction. He declines at this time.   - rx for 10 tablets of oxycodone 5-325 mg provided today due to acute pain  ??  Pain - secondary to inflammation from HS - acute un complicated illness or injury   - repeat ILK today to inflamed nodule on right scrotum   -  oxyCODONE-acetaminophen (PERCOCET) 5-325 mg per tablet; Take 1 tablet by mouth every six (6) hours as needed for pain for up to 3 days. SEDATING MEDICATION  R/B/A reviewed including but not limited to sedation.   - triamcinolone acetonide (KENALOG-40) injection 40 mg  ??  High risk medication use, Humira  - check quant gold check today   - hepatitis serologies reviewed C, B sAb, B sAg, cAb 04/2018     In interim:   He states that his insurance stopped covering the Humira at 80mg  weekly for a period of time, and when he was down to the 40mg  weekly he was not having adequate control. He has been back up on the Humira 80mg  weekly for about a month, and it has not fully kicked back in. He is currently flaring on his right scrotum. He does think that the Humira has allowed for clearance of his HS previously, so he would like to continue on this regimen for now.    The patient denies any other new or changing lesions or areas of concern.     Pertinent Past Medical History     No history of skin cancer    Family History:   Negative for melanoma    Past Medical History, Family History, Social History, Medication List, Allergies, and Problem List were reviewed in the rooming section of Epic.     ROS: Other than symptoms mentioned in the HPI, no fevers, chills, or other skin complaints    Physical Examination     GENERAL: Well-appearing male in no acute distress, resting comfortably.  NEURO: Alert and oriented, answers questions appropriately  PSYCH: Normal mood and affect  SKIN (Focal Skin Exam): Per patient request, examination of external genitalia was performed  - inflammatory nodule on the right mons  - erythema with some satellite macules on the right inguinal fold, right thigh and infra-abdominal skin    All areas not commented on are within normal limits or unremarkable      (Approved Template 02/21/2020)

## 2020-10-24 DIAGNOSIS — Z794 Long term (current) use of insulin: Principal | ICD-10-CM

## 2020-10-24 DIAGNOSIS — E1165 Type 2 diabetes mellitus with hyperglycemia: Principal | ICD-10-CM

## 2020-10-24 MED ORDER — DULAGLUTIDE 0.75 MG/0.5 ML SUBCUTANEOUS PEN INJECTOR
SUBCUTANEOUS | 1 refills | 28.00000 days | Status: CP
Start: 2020-10-24 — End: ?

## 2020-10-24 NOTE — Unmapped (Signed)
Logan Quinn notified and agrees to monitor his blood sugars very closely.

## 2020-10-24 NOTE — Unmapped (Signed)
Yes, the Trulicity is compatible with insulin. You will just need to watch closely for hypoglycemia. I sent in order for RX.

## 2020-10-24 NOTE — Unmapped (Signed)
He also carries sugar tabs with him and in his vehicle.

## 2020-10-25 ENCOUNTER — Encounter: Admit: 2020-10-25 | Discharge: 2020-10-26 | Payer: MEDICAID | Attending: Dermatology | Primary: Dermatology

## 2020-10-25 DIAGNOSIS — L304 Erythema intertrigo: Principal | ICD-10-CM

## 2020-10-25 DIAGNOSIS — Z79899 Other long term (current) drug therapy: Principal | ICD-10-CM

## 2020-10-25 DIAGNOSIS — L732 Hidradenitis suppurativa: Principal | ICD-10-CM

## 2020-10-25 MED ORDER — KETOCONAZOLE 2 % TOPICAL CREAM
Freq: Every day | TOPICAL | 11 refills | 120.00000 days | Status: CP
Start: 2020-10-25 — End: 2021-10-25

## 2020-10-25 MED ADMIN — triamcinolone acetonide (KENALOG-40) injection 20 mg: 20 mg | @ 19:00:00 | Stop: 2020-10-25

## 2020-10-25 NOTE — Unmapped (Signed)
Apply ketoconazole cream once a day to the irritated area in the groin until it clear  Start zinc oxide diaper cream in the affected area to prevent irritation while flaring

## 2020-10-27 LAB — QUANTIFERON TB GOLD PLUS
QUANTIFERON ANTIGEN 1 MINUS NIL: 0.03 [IU]/mL
QUANTIFERON ANTIGEN 2 MINUS NIL: 0.2 [IU]/mL
QUANTIFERON MITOGEN: 9.91 [IU]/mL
QUANTIFERON TB GOLD PLUS: NEGATIVE
QUANTIFERON TB NIL VALUE: 0.09 [IU]/mL

## 2020-10-27 LAB — TB NIL: TB NIL VALUE: 0.09

## 2020-10-27 LAB — TB AG2: TB AG2 VALUE: 0.29

## 2020-10-27 LAB — TB AG1: TB AG1 VALUE: 0.12

## 2020-10-27 LAB — TB MITOGEN: TB MITOGEN VALUE: 10

## 2020-10-27 NOTE — Unmapped (Signed)
[facility] Refill Coordination Note    Specialty Medication(s) to be Shipped:   Inflammatory Disorders: Humira    Other medication(s) to be shipped: No additional medications requested for fill at this time     Sukhdeep Wieting, DOB: 23-Aug-1980  Phone: [phone] (home)       All above HIPAA information was verified with patient.     Was a Nurse, learning disability used for this call? No    Completed refill call assessment today to schedule patient's medication shipment from the [facility] [phone]).  All relevant notes have been reviewed.     Specialty medication(s) and dose(s) confirmed: Regimen is correct and unchanged.   Changes to medications: Martavius reports starting the following medications: Trulicity  Changes to insurance: No  New side effects reported not previously addressed with a pharmacist or physician: None reported  Questions for the pharmacist: No    Confirmed patient received a Conservation officer, historic buildings and a Surveyor, mining with first shipment. The patient will receive a drug information handout for each medication shipped and additional FDA Medication Guides as required.       DISEASE/MEDICATION-SPECIFIC INFORMATION        For patients on injectable medications: Patient currently has 1 doses left.  Next injection is scheduled for 11/02/2020.    SPECIALTY MEDICATION ADHERENCE     Medication Adherence    Patient reported X missed doses in the last month: 0  Specialty Medication: Humira CF 40 mg/0.4 ml  Patient is on additional specialty medications: No  Any gaps in refill history greater than 2 weeks in the last 3 months: no  Demonstrates understanding of importance of adherence: yes  Informant: patient  Reliability of informant: reliable  Confirmed plan for next specialty medication refill: delivery by pharmacy  Refills needed for supportive medications: not needed              Were doses missed due to medication being on hold? No    Humira CF 40 mg/0.4 ml mg/ml: 7 days of medicine on hand       REFERRAL TO PHARMACIST     Referral to the pharmacist: Not needed      [facility]     Shipping address confirmed in Epic.     Delivery Scheduled: Yes, Expected medication delivery date: 11/02/2020.     Medication will be delivered via Same Day Courier to the prescription address in Epic WAM.    Mayia Megill D Littie Chiem   [facility] Specialty Technician

## 2020-10-29 MED ORDER — ONDANSETRON 4 MG DISINTEGRATING TABLET
ORAL_TABLET | Freq: Three times a day (TID) | ORAL | 1 refills | 5 days | Status: CP | PRN
Start: 2020-10-29 — End: 2021-10-29

## 2020-10-30 NOTE — Unmapped (Signed)
10/30/20- Informed patient of his normal TB test results via mychart. LNC

## 2020-11-02 MED FILL — HUMIRA PEN CITRATE FREE 40 MG/0.4 ML: 28 days supply | Qty: 8 | Fill #2

## 2020-11-08 NOTE — Unmapped (Signed)
Reviewed

## 2020-11-09 ENCOUNTER — Other Ambulatory Visit: Payer: Self-pay

## 2020-11-09 ENCOUNTER — Ambulatory Visit
Admission: RE | Admit: 2020-11-09 | Discharge: 2020-11-09 | Disposition: A | Discharge status: Home / Self Care | Payer: Medicare Other | Source: Ambulatory Visit | Attending: Emergency Medicine | Admitting: Emergency Medicine

## 2020-11-09 VITALS — BP 152/75 | HR 67 | Temp 97.8°F | Resp 18

## 2020-11-09 DIAGNOSIS — Z28310 Unvaccinated for covid-19: Secondary | ICD-10-CM | POA: Diagnosis not present

## 2020-11-09 DIAGNOSIS — Z20822 Contact with and (suspected) exposure to covid-19: Secondary | ICD-10-CM | POA: Insufficient documentation

## 2020-11-09 DIAGNOSIS — J029 Acute pharyngitis, unspecified: Secondary | ICD-10-CM | POA: Diagnosis present

## 2020-11-09 DIAGNOSIS — B349 Viral infection, unspecified: Secondary | ICD-10-CM | POA: Diagnosis not present

## 2020-11-09 DIAGNOSIS — J45909 Unspecified asthma, uncomplicated: Secondary | ICD-10-CM | POA: Diagnosis not present

## 2020-11-09 DIAGNOSIS — Z7951 Long term (current) use of inhaled steroids: Secondary | ICD-10-CM | POA: Diagnosis not present

## 2020-11-09 LAB — GROUP A STREP BY PCR: Group A Strep by PCR: NOT DETECTED

## 2020-11-09 MED ORDER — LIDOCAINE VISCOUS HCL 2 % MT SOLN: 15.0000 mL | OROMUCOSAL | 0 refills | Status: DC | PRN

## 2020-11-09 NOTE — Discharge Instructions (Signed)
The strep test was negative.  This is likely a viral sore throat.  We have obtained a COVID test.  See more information below.  We will contact you if your test is positive and may recommend further treatment such as antiviral medication or antibody therapy.  At this time, increase rest and fluids.  I have sent in viscous lidocaine.  You can take ibuprofen and/or Tylenol for throat discomfort, use throat lozenges and Chloraseptic spray to help with sore throat.  You have received COVID testing today either for positive exposure, concerning symptoms that could be related to COVID infection, screening purposes, or re-testing after confirmed positive.  Your test obtained today checks for active viral infection in the last 1-2 weeks. If your test is negative now, you can still test positive later. So, if you do develop symptoms you should either get re-tested and/or isolate x 5 days and then strict mask use x 5 days (unvaccinated) or mask use x 10 days (vaccinated). Please follow CDC guidelines.  While Rapid antigen tests come back in 15-20 minutes, send out PCR/molecular test results typically come back within 1-3 days. In the mean time, if you are symptomatic, assume this could be a positive test and treat/monitor yourself as if you do have COVID.   We will call with test results if positive. Please download the MyChart app and set up a profile to access test results.   If symptomatic, go home and rest. Push fluids. Take Tylenol as needed for discomfort. Gargle warm salt water. Throat lozenges. Take Mucinex DM or Robitussin for cough. Humidifier in bedroom to ease coughing. Warm showers. Also review the COVID handout for more information.  COVID-19 INFECTION: The incubation period of COVID-19 is approximately 14 days after exposure, with most symptoms developing in roughly 4-5 days. Symptoms may range in severity from mild to critically severe. Roughly 80% of those infected will have mild symptoms. People  of any age may become infected with COVID-19 and have the ability to transmit the virus. The most common symptoms include: fever, fatigue, cough, body aches, headaches, sore throat, nasal congestion, shortness of breath, nausea, vomiting, diarrhea, changes in smell and/or taste.    COURSE OF ILLNESS Some patients may begin with mild disease which can progress quickly into critical symptoms. If your symptoms are worsening please call ahead to the Emergency Department and proceed there for further treatment. Recovery time appears to be roughly 1-2 weeks for mild symptoms and 3-6 weeks for severe disease.   GO IMMEDIATELY TO ER FOR FEVER YOU ARE UNABLE TO GET DOWN WITH TYLENOL, BREATHING PROBLEMS, CHEST PAIN, FATIGUE, LETHARGY, INABILITY TO EAT OR DRINK, ETC  QUARANTINE AND ISOLATION: To help decrease the spread of COVID-19 please remain isolated if you have COVID infection or are highly suspected to have COVID infection. This means -stay home and isolate to one room in the home if you live with others. Do not share a bed or bathroom with others while ill, sanitize and wipe down all countertops and keep common areas clean and disinfected. Stay home for 5 days. If you have no symptoms or your symptoms are resolving after 5 days, you can leave your house. Continue to wear a mask around others for 5 additional days. If you have been in close contact (within 6 feet) of someone diagnosed with COVID 19, you are advised to quarantine in your home for 14 days as symptoms can develop anywhere from 2-14 days after exposure to the virus. If you develop  symptoms, you  must isolate.  Most current guidelines for COVID after exposure -unvaccinated: isolate 5 days and strict mask use x 5 days. Test on day 5 is possible -vaccinated: wear mask x 10 days if symptoms do not develop -You do not necessarily need to be tested for COVID if you have + exposure and  develop symptoms. Just isolate at home x10 days from symptom  onset During this global pandemic, CDC advises to practice social distancing, try to stay at least 38ft away from others at all times. Wear a face covering. Wash and sanitize your hands regularly and avoid going anywhere that is not necessary.  KEEP IN MIND THAT THE COVID TEST IS NOT 100% ACCURATE AND YOU SHOULD STILL DO EVERYTHING TO PREVENT POTENTIAL SPREAD OF VIRUS TO OTHERS (WEAR MASK, WEAR GLOVES, WASH HANDS AND SANITIZE REGULARLY). IF INITIAL TEST IS NEGATIVE, THIS MAY NOT MEAN YOU ARE DEFINITELY NEGATIVE. MOST ACCURATE TESTING IS DONE 5-7 DAYS AFTER EXPOSURE.   It is not advised by CDC to get re-tested after receiving a positive COVID test since you can still test positive for weeks to months after you have already cleared the virus.   *If you have not been vaccinated for COVID, I strongly suggest you consider getting vaccinated as long as there are no contraindications.

## 2020-11-09 NOTE — ED Provider Notes (Signed)
[facility]    CSN: [id number] Arrival date & time: 11/09/20  0901      History   Chief Complaint Chief Complaint  Patient presents with  . Sore Throat    HPI Jason Davidson is a 40 y.o. male presenting for sore throat x2 to 3 days.  Patient denies any other symptoms.  He has not had any fever, fatigue, headaches, achiness, cough, congestion, breathing difficulty, nausea/vomiting or diarrhea.  He denies any sick contacts and no known exposure to COVID-19.  Not vaccinated for COVID-19.  Has taken Tylenol over-the-counter for sore throat, but no other medications.  Past medical history significant for for insulin-dependent diabetes, morbid obesity, GERD, and asthma.  No other complaints or concerns.  HPI  Past Medical History:  Diagnosis Date  . Asthma   . Depressed   . Diabetes mellitus without complication (HCC)   . IBS (irritable bowel syndrome)   . Morbid obesity [facility](HCC)     Patient Active Problem List   Diagnosis Date Noted  . Hidradenitis suppurativa 04/29/2018  . Allergic rhinitis 12/23/2017  . Varicose veins of both lower extremities with pain 10/16/2017  . IBS (irritable bowel syndrome) 06/18/2017  . Chronic venous insufficiency 12/31/2016  . Lymphedema 12/31/2016  . Leg pain 12/31/2016  . Diabetes (HCC) 12/31/2016  . GERD (gastroesophageal reflux disease) 12/31/2016  . 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 H/O retinal detachment 10/01/2016  . No diabetic retinopathy in both eyes 10/01/2016  . Cellulitis of right lower extremity 06/19/2016  . Chronic joint pain 11/16/2015  . Skin infection 05/15/2015  . Depressed   . Tick bite 10/20/2014  . Primary insomnia 07/08/2014  . Candidal balanitis 02/16/2014  . Skin tag 01/17/2014  . Left shoulder pain 10/04/2013  . Anxiety 09/13/2013  . Diarrhea 09/13/2013  . Bleeding external hemorrhoids 09/03/2013  . NAFLD (nonalcoholic fatty liver disease) 78/29/562103/10/2013  . Headache  07/05/2013  . Abdominal pain 03/05/2013  . Morbid (severe) obesity due to excess calories (HCC) 03/05/2013  . Suicidal ideation 02/09/2013  . Obstructive sleep apnea (adult) (pediatric) 07/21/2012  . 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    Past Surgical History:  Procedure Laterality Date  . EYE SURGERY         Home Medications    Prior to Admission medications   Medication Sig Start Date End Date Taking? Authorizing Provider  lidocaine (XYLOCAINE) 2 % solution Use as directed 15 mLs in the mouth or throat every 3 (three) hours as needed for mouth pain (swish and spit). 11/09/20  Yes Eusebio FriendlyEaves, Bettye Sitton B, PA-C  Adalimumab 40 MG/0.4ML PNKT Inject into the skin. 09/10/18   [provider]  AIMOVIG 70 MG/ML SOAJ  07/21/17   [provider]  albuterol (VENTOLIN HFA) 108 (90 Base) MCG/ACT inhaler Inhale 1-2 puffs into the lungs every 6 (six) hours as needed for wheezing or shortness of breath. 04/06/20   Becky Augustayan, Jeremy, NP  ALPRAZolam Prudy Feeler(XANAX) 0.5 MG tablet Take 0.5 mg by mouth at bedtime as needed for anxiety.    [provider]  amphetamine-dextroamphetamine (ADDERALL) 10 MG tablet Take 10 mg by mouth 2 (two) times daily with a meal.    [provider]  azelastine (ASTELIN) 0.1 % nasal spray Place into the nose.    [provider]  Colchicine 0.6 MG CAPS Take 1.2 mg on Day 1 of flare, followed by 0.6 mg one hour later. Day 2 take 0.6 mg BID until flare resolves. 08/01/17  [provider]  cyclobenzaprine (FLEXERIL) 10 MG tablet Take by mouth. 01/12/15   [provider]  cyclobenzaprine (FLEXERIL) 10 MG tablet Take 1 tablet (10 mg total) by mouth 3 (three) times daily as needed. 01/31/18   Joni Reining, PA-C  dicyclomine (BENTYL) 10 MG capsule Take 10 mg by mouth 3 (three) times daily before meals.    [provider]  diphenoxylate-atropine (LOMOTIL) 2.5-0.025 MG tablet Take by mouth. 01/05/18   [provider]  DULoxetine  (CYMBALTA) 60 MG capsule Take 120 mg by mouth daily.     [provider]  famotidine (PEPCID) 20 MG tablet Take by mouth. 10/15/18 10/15/19  [provider]  Fluticasone-Salmeterol (ADVAIR) 500-50 MCG/DOSE AEPB Inhale 1 puff into the lungs 2 (two) times daily.    [provider]  gabapentin (NEURONTIN) 100 MG capsule Take 100 mg by mouth 3 (three) times daily.    [provider]  gabapentin (NEURONTIN) 300 MG capsule Take 300 mg by mouth 3 (three) times daily. 01/21/20   [provider]  glipiZIDE (GLUCOTROL) 10 MG tablet Take 10 mg by mouth daily before breakfast.    [provider]  glucose blood test strip Use 1 strip 3 times a day with meter 06/26/15   [provider]  HUMALOG 100 UNIT/ML injection SMARTSIG:120 Unit(s) SUB-Q Daily 03/01/20   [provider]  hydrocortisone (ANUSOL-HC) 2.5 % rectal cream Place rectally. 12/16/16   [provider]  hydrocortisone (ANUSOL-HC) 25 MG suppository Place rectally. 01/14/17   [provider]  insulin glargine (LANTUS) 100 UNIT/ML injection Inject into the skin. 07/10/17 02/18/19  [provider]  insulin regular (NOVOLIN R,HUMULIN R) 250 units/2.6mL (100 units/mL) injection Inject into the skin 3 (three) times daily before meals.    [provider]  Insulin Syringe-Needle U-100 31G X 5/16" 1 ML MISC Use as directed with the Lantus Insulin nightly 08/26/17   [provider]  levothyroxine (SYNTHROID) 50 MCG tablet Take by mouth. 10/05/18 10/05/19  [provider]  loperamide (IMODIUM) 2 MG capsule Take by mouth.    [provider]  montelukast (SINGULAIR) 10 MG tablet Take by mouth. 03/31/17 03/03/20  [provider]  neomycin-polymyxin-hydrocortisone (CORTISPORIN) 3.5-10000-1 OTIC suspension Apply 1-2 drops daily after soaking and cover with bandaid 04/24/20   McDonald, Rachelle Hora, DPM  pantoprazole (PROTONIX) 40 MG tablet Take 40 mg  by mouth daily.    [provider]  polyethylene glycol powder (GLYCOLAX/MIRALAX) powder take 17GM (DISSOLVED IN WATER) by mouth once daily 07/25/17   [provider]  PROAIR HFA 108 [phone] Base) MCG/ACT inhaler  07/21/17   [provider]  promethazine-dextromethorphan (PROMETHAZINE-DM) 6.25-15 MG/5ML syrup Take 5 mLs by mouth 4 (four) times daily as needed. Patient taking differently: Take 5 mLs by mouth 4 (four) times daily as needed. 04/06/20   Becky Augusta, NP  propranolol (INNOPRAN XL) 120 MG 24 hr capsule Take 120 mg by mouth at bedtime.    [provider]  topiramate (TOPAMAX) 50 MG tablet Take 50 mg by mouth 2 (two) times daily.    [provider]  traZODone (DESYREL) 100 MG tablet Take by mouth.    [provider]    Family History Family History  Problem Relation Age of Onset  . Diabetes Mother   . Depression Mother   . Diabetes Sister   . Diabetes Brother   . Diabetes Maternal Uncle   . Diabetes Maternal Grandmother   . Heart  disease Maternal Grandmother   . Cancer Maternal Grandfather   . COPD Paternal Grandmother   . Cancer Paternal Grandfather   . Varicose Veins Paternal Grandfather   . Healthy Father     Social History Social History   Tobacco Use  . Smoking status: Never Smoker  . Smokeless tobacco: Never Used  Vaping Use  . Vaping Use: Never used  Substance Use Topics  . Alcohol use: No  . Drug use: No     Allergies   Erythromycin, Penicillins, and Sulfa antibiotics   Review of Systems Review of Systems  Constitutional: Negative for fatigue and fever.  HENT: Positive for sore throat. Negative for congestion, rhinorrhea, sinus pressure and sinus pain.   Respiratory: Negative for cough and shortness of breath.   Gastrointestinal: Negative for abdominal pain, diarrhea, nausea and vomiting.  Musculoskeletal: Negative for myalgias.  Neurological: Negative for weakness, light-headedness and headaches.   Hematological: Negative for adenopathy.     Physical Exam Triage Vital Signs ED Triage Vitals  Enc Vitals Group     BP 11/09/20 0909 (!) 152/75     Pulse Rate 11/09/20 0909 67     Resp 11/09/20 0909 18     Temp 11/09/20 0909 97.8 F (36.6 C)     Temp Source 11/09/20 0909 Oral     SpO2 11/09/20 0909 100 %     Weight --      Height --      Head Circumference --      Peak Flow --      Pain Score 11/09/20 0905 7     Pain Loc --      Pain Edu? --      Excl. in GC? --    No data found.  Updated Vital Signs BP (!) 152/75   Pulse 67   Temp 97.8 F (36.6 C) (Oral)   Resp 18   SpO2 100%       Physical Exam Vitals and nursing note reviewed.  Constitutional:      General: He is not in acute distress.    Appearance: Normal appearance. He is well-developed. He is obese. He is not ill-appearing or diaphoretic.  HENT:     Head: Normocephalic and atraumatic.     Nose: Nose normal.     Mouth/Throat:     Mouth: Mucous membranes are moist.     Pharynx: Oropharynx is clear. Uvula midline. Posterior oropharyngeal erythema present.     Tonsils: No tonsillar abscesses.  Eyes:     General: No scleral icterus.       Right eye: No discharge.        Left eye: No discharge.     Conjunctiva/sclera: Conjunctivae normal.  Neck:     Thyroid: No thyromegaly.     Trachea: No tracheal deviation.  Cardiovascular:     Rate and Rhythm: Normal rate and regular rhythm.     Heart sounds: Normal heart sounds.  Pulmonary:     Effort: Pulmonary effort is normal. No respiratory distress.     Breath sounds: Normal breath sounds. No wheezing, rhonchi or rales.  Musculoskeletal:     Cervical back: Neck supple.  Lymphadenopathy:     Cervical: No cervical adenopathy.  Skin:    General: Skin is dry.  Neurological:     General: No focal deficit present.     Mental Status: He is alert.     Motor: No weakness.     Gait: Gait normal.  Psychiatric:  Mood and Affect: Mood normal.         Behavior: Behavior normal.        Thought Content: Thought content normal.      UC Treatments / Results  Labs (all labs ordered are listed, but only abnormal results are displayed) Labs Reviewed  GROUP A STREP BY PCR  SARS CORONAVIRUS 2 (TAT 6-24 HRS)    EKG   Radiology No results found.  Procedures Procedures (including critical care time)  Medications Ordered in UC Medications - No data to display  Initial Impression / Assessment and Plan / UC Course  I have reviewed the triage vital signs and the nursing notes.  Pertinent labs & imaging results that were available during my care of the patient were reviewed by me and considered in my medical decision making (see chart for details).   40 year old male presenting for sore throat x2 to 3 days.  Denies any other symptoms.  He is afebrile and overall well-appearing in clinic.  Exam significant for mild posterior pharyngeal erythema.  Molecular strep test obtained is negative today.  Reviewed result with patient.  Suspect viral pharyngitis versus sore throat related to GERD.  Advised to continue with his GERD medication.  We are testing him for COVID-19.  Reviewed current CDC guidelines, isolation protocol and ED precautions with patient.  He is unvaccinated for COVID-19 and would be a good candidate for further treatment if COVID-positive.  Advised increased rest and fluids.  Have sent viscous lidocaine to pharmacy at this time.  Advised to follow-up with [place] as needed for any worsening symptoms or if is not feeling better after 7 to 10 days.   Final Clinical Impressions(s) / UC Diagnoses   Final diagnoses:  Viral pharyngitis  Sore throat     Discharge Instructions     The strep test was negative.  This is likely a viral sore throat.  We have obtained a COVID test.  See more information below.  We will contact you if your test is positive and may recommend further treatment such as antiviral medication or antibody  therapy.  At this time, increase rest and fluids.  I have sent in viscous lidocaine.  You can take ibuprofen and/or Tylenol for throat discomfort, use throat lozenges and Chloraseptic spray to help with sore throat.  You have received COVID testing today either for positive exposure, concerning symptoms that could be related to COVID infection, screening purposes, or re-testing after confirmed positive.  Your test obtained today checks for active viral infection in the last 1-2 weeks. If your test is negative now, you can still test positive later. So, if you do develop symptoms you should either get re-tested and/or isolate x 5 days and then strict mask use x 5 days (unvaccinated) or mask use x 10 days (vaccinated). Please follow CDC guidelines.  While Rapid antigen tests come back in 15-20 minutes, send out PCR/molecular test results typically come back within 1-3 days. In the mean time, if you are symptomatic, assume this could be a positive test and treat/monitor yourself as if you do have COVID.   We will call with test results if positive. Please download the MyChart app and set up a profile to access test results.   If symptomatic, go home and rest. Push fluids. Take Tylenol as needed for discomfort. Gargle warm salt water. Throat lozenges. Take Mucinex DM or Robitussin for cough. Humidifier in bedroom to ease coughing. Warm showers. Also review the COVID handout for more  information.  COVID-19 INFECTION: The incubation period of COVID-19 is approximately 14 days after exposure, with most symptoms developing in roughly 4-5 days. Symptoms may range in severity from mild to critically severe. Roughly 80% of those infected will have mild symptoms. People of any age may become infected with COVID-19 and have the ability to transmit the virus. The most common symptoms include: fever, fatigue, cough, body aches, headaches, sore throat, nasal congestion, shortness of breath, nausea, vomiting, diarrhea,  changes in smell and/or taste.    COURSE OF ILLNESS Some patients may begin with mild disease which can progress quickly into critical symptoms. If your symptoms are worsening please call ahead to the Emergency Department and proceed there for further treatment. Recovery time appears to be roughly 1-2 weeks for mild symptoms and 3-6 weeks for severe disease.   GO IMMEDIATELY TO ER FOR FEVER YOU ARE UNABLE TO GET DOWN WITH TYLENOL, BREATHING PROBLEMS, CHEST PAIN, FATIGUE, LETHARGY, INABILITY TO EAT OR DRINK, ETC  QUARANTINE AND ISOLATION: To help decrease the spread of COVID-19 please remain isolated if you have COVID infection or are highly suspected to have COVID infection. This means -stay home and isolate to one room in the home if you live with others. Do not share a bed or bathroom with others while ill, sanitize and wipe down all countertops and keep common areas clean and disinfected. Stay home for 5 days. If you have no symptoms or your symptoms are resolving after 5 days, you can leave your house. Continue to wear a mask around others for 5 additional days. If you have been in close contact (within 6 feet) of someone diagnosed with COVID 19, you are advised to quarantine in your home for 14 days as symptoms can develop anywhere from 2-14 days after exposure to the virus. If you develop symptoms, you  must isolate.  Most current guidelines for COVID after exposure -unvaccinated: isolate 5 days and strict mask use x 5 days. Test on day 5 is possible -vaccinated: wear mask x 10 days if symptoms do not develop -You do not necessarily need to be tested for COVID if you have + exposure and  develop symptoms. Just isolate at home x10 days from symptom onset During this global pandemic, CDC advises to practice social distancing, try to stay at least 68ft away from others at all times. Wear a face covering. Wash and sanitize your hands regularly and avoid going anywhere that is not necessary.  KEEP IN  MIND THAT THE COVID TEST IS NOT 100% ACCURATE AND YOU SHOULD STILL DO EVERYTHING TO PREVENT POTENTIAL SPREAD OF VIRUS TO OTHERS (WEAR MASK, WEAR GLOVES, WASH HANDS AND SANITIZE REGULARLY). IF INITIAL TEST IS NEGATIVE, THIS MAY NOT MEAN YOU ARE DEFINITELY NEGATIVE. MOST ACCURATE TESTING IS DONE 5-7 DAYS AFTER EXPOSURE.   It is not advised by CDC to get re-tested after receiving a positive COVID test since you can still test positive for weeks to months after you have already cleared the virus.   *If you have not been vaccinated for COVID, I strongly suggest you consider getting vaccinated as long as there are no contraindications.      ED Prescriptions    Medication Sig Dispense Auth. Provider   lidocaine (XYLOCAINE) 2 % solution Use as directed 15 mLs in the mouth or throat every 3 (three) hours as needed for mouth pain (swish and spit). 100 mL Shirlee Latch, PA-C     PDMP not reviewed this encounter.  Shirlee Latch, PA-C 11/09/20 1002

## 2020-11-09 NOTE — ED Triage Notes (Signed)
Pt is present today with a sore that started x3 days ago

## 2020-11-10 LAB — SARS CORONAVIRUS 2 (TAT 6-24 HRS): SARS Coronavirus 2: NEGATIVE

## 2020-11-16 ENCOUNTER — Ambulatory Visit: Payer: Medicare Other | Admitting: Podiatry

## 2020-11-16 ENCOUNTER — Ambulatory Visit (INDEPENDENT_AMBULATORY_CARE_PROVIDER_SITE_OTHER): Payer: Medicare Other | Admitting: Podiatry

## 2020-11-16 ENCOUNTER — Other Ambulatory Visit: Payer: Self-pay

## 2020-11-16 ENCOUNTER — Encounter: Payer: Self-pay | Admitting: Podiatry

## 2020-11-16 DIAGNOSIS — M79676 Pain in unspecified toe(s): Secondary | ICD-10-CM | POA: Diagnosis not present

## 2020-11-16 DIAGNOSIS — E1142 Type 2 diabetes mellitus with diabetic polyneuropathy: Secondary | ICD-10-CM | POA: Diagnosis not present

## 2020-11-16 DIAGNOSIS — B351 Tinea unguium: Secondary | ICD-10-CM

## 2020-11-16 NOTE — Progress Notes (Signed)
This patient returns to my office for at risk foot care.  This patient requires this care by a professional since this patient will be at risk due to having diabetes with no complications.  This patient is unable to cut nails himself since the patient cannot reach his nails.These nails are painful walking and wearing shoes.  This patient presents for at risk foot care today.  General Appearance  Alert, conversant and in no acute stress.  Vascular  Dorsalis pedis and posterior tibial  pulses are palpable  bilaterally.  Capillary return is within normal limits  bilaterally. Temperature is within normal limits  bilaterally.  Neurologic  Senn-Weinstein monofilament wire test within normal limits  bilaterally. Muscle power within normal limits bilaterally.  Nails Thick disfigured discolored nails with subungual debris  from hallux to fifth toes bilaterally. No evidence of bacterial infection or drainage bilaterally.  Orthopedic  No limitations of motion  feet .  No crepitus or effusions noted.  No bony pathology or digital deformities noted.  Skin  normotropic skin with no porokeratosis noted bilaterally.  No signs of infections or ulcers noted.     Onychomycosis  Pain in right toes  Pain in left toes  Consent was obtained for treatment procedures.   Mechanical debridement of nails 1-5  bilaterally performed with a nail nipper.  Filed with dremel without incident.    Return office visit   4 months                   Told patient to return for periodic foot care and evaluation due to potential at risk complications.   Helane Gunther DPM

## 2020-11-17 DIAGNOSIS — Z794 Long term (current) use of insulin: Principal | ICD-10-CM

## 2020-11-17 DIAGNOSIS — E1165 Type 2 diabetes mellitus with hyperglycemia: Principal | ICD-10-CM

## 2020-11-17 MED ORDER — DULAGLUTIDE 0.75 MG/0.5 ML SUBCUTANEOUS PEN INJECTOR
SUBCUTANEOUS | 1 refills | 28.00000 days | Status: CP
Start: 2020-11-17 — End: ?

## 2020-11-17 NOTE — Unmapped (Signed)
Patient is requesting the following refill  Requested Prescriptions     Pending Prescriptions Disp Refills   ??? dulaglutide (TRULICITY) 0.75 mg/0.5 mL injection pen 2 mL 1     Sig: Inject 0.5 mL (0.75 mg total) under the skin every seven (7) days.       Recent Visits  Date Type Provider Dept   10/11/20 Office Visit [name] Rosita Fire, FNP [facility]   08/11/20 Office Visit [name] Rosita Fire, FNP [facility]   07/04/20 Office Visit [name] Rosita Fire, FNP [facility]   02/21/20 Office Visit [name] Rosita Fire, FNP [facility]   11/18/19 Office Visit [name] Rosita Fire, FNP [facility]   Showing recent visits within past 365 days with a meds authorizing provider and meeting all other requirements  Future Appointments  Date Type Provider Dept   01/09/21 Appointment [name] Rosita Fire, FNP [facility]   Showing future appointments within next 365 days with a meds authorizing provider and meeting all other requirements

## 2020-11-20 NOTE — Unmapped (Signed)
Dexcom order received and give to [name] to  sign

## 2020-11-24 MED ORDER — EMPTY CONTAINER
0 refills | 0 days
Start: 2020-11-24 — End: ?

## 2020-11-24 NOTE — Unmapped (Signed)
[facility] Refill Coordination Note    Specialty Medication(s) to be Shipped:   Inflammatory Disorders: Humira    Other medication(s) to be shipped: No additional medications requested for fill at this time     Logan Quinn, DOB: Oct 14, 1980  Phone: [phone] (home)       All above HIPAA information was verified with patient.     Was a Nurse, learning disability used for this call? No    Completed refill call assessment today to schedule patient's medication shipment from the [facility] [phone]).  All relevant notes have been reviewed.     Specialty medication(s) and dose(s) confirmed: Regimen is correct and unchanged.   Changes to medications: Logan Quinn reports no changes at this time.  Changes to insurance: No  New side effects reported not previously addressed with a pharmacist or physician: None reported  Questions for the pharmacist: No    Confirmed patient received a Conservation officer, historic buildings and a Surveyor, mining with first shipment. The patient will receive a drug information handout for each medication shipped and additional FDA Medication Guides as required.       DISEASE/MEDICATION-SPECIFIC INFORMATION        For patients on injectable medications: Patient currently has 0 doses left.  Next injection is scheduled for 12/01/20.    SPECIALTY MEDICATION ADHERENCE     Medication Adherence    Patient reported X missed doses in the last month: 0  Specialty Medication: Humira CF 40 mg/0.4 ml  Patient is on additional specialty medications: No              Were doses missed due to medication being on hold? No        REFERRAL TO PHARMACIST     Referral to the pharmacist: Not needed      [facility]     Shipping address confirmed in Epic.     Delivery Scheduled: Yes, Expected medication delivery date: 11/28/20.     Medication will be delivered via Same Day Courier to the prescription address in Epic WAM.    Logan Quinn   [facility] Specialty Technician

## 2020-11-24 NOTE — Unmapped (Signed)
Voice message from [place] with [facility] about diabetic supplies.  Left message with [name] to see if he knows what this is pertaining to.

## 2020-11-27 NOTE — Unmapped (Signed)
[name] said he gets Insulin Pump Cartridge from them and maybe Dexcom.

## 2020-11-27 NOTE — Unmapped (Signed)
[facility] did fax form for Omnipod renewal.

## 2020-11-28 MED FILL — EMPTY CONTAINER: 120 days supply | Qty: 1 | Fill #0

## 2020-11-28 MED FILL — HUMIRA PEN CITRATE FREE 40 MG/0.4 ML: 28 days supply | Qty: 8 | Fill #3

## 2020-11-30 NOTE — Unmapped (Signed)
Left message with [facility] at [phone] asking them to fax Dexcom RX needed per [name].  Receiver RX was faxed to them 03/14/2020 with 12 refills.

## 2020-12-01 NOTE — Unmapped (Signed)
Left message with R Eggleston at [facility] to send Dexcom form if needed.  Appears to have 11 refills and was faxed last 03/2020.

## 2020-12-05 MED ORDER — ONDANSETRON 4 MG DISINTEGRATING TABLET
ORAL_TABLET | Freq: Three times a day (TID) | ORAL | 1 refills | 5 days | PRN
Start: 2020-12-05 — End: 2021-12-05

## 2020-12-05 NOTE — Unmapped (Signed)
PTHomeBP

## 2020-12-06 DIAGNOSIS — K591 Functional diarrhea: Principal | ICD-10-CM

## 2020-12-06 DIAGNOSIS — G43909 Migraine, unspecified, not intractable, without status migrainosus: Principal | ICD-10-CM

## 2020-12-06 MED ORDER — ONDANSETRON 4 MG DISINTEGRATING TABLET
ORAL_TABLET | Freq: Three times a day (TID) | ORAL | 1 refills | 5 days | Status: CP | PRN
Start: 2020-12-06 — End: 2021-12-06

## 2020-12-06 MED ORDER — PROPRANOLOL ER 120 MG CAPSULE,24 HR,EXTENDED RELEASE
ORAL_CAPSULE | Freq: Every day | ORAL | 1 refills | 90 days
Start: 2020-12-06 — End: 2021-12-06

## 2020-12-06 MED ORDER — DICYCLOMINE 10 MG CAPSULE
ORAL_CAPSULE | Freq: Three times a day (TID) | ORAL | 1 refills | 90 days
Start: 2020-12-06 — End: 2021-12-06

## 2020-12-06 NOTE — Unmapped (Signed)
Patient is requesting the following refill  Requested Prescriptions     Pending Prescriptions Disp Refills   ??? ondansetron (ZOFRAN-ODT) 4 MG disintegrating tablet 15 tablet 1     Sig: Take 1 tablet (4 mg total) by mouth every eight (8) hours as needed for nausea for up to 15 doses.       Recent Visits  Date Type Provider Dept   10/11/20 Office Visit [name] Rosita Fire, FNP [place] [facility]   08/11/20 Office Visit [name] Rosita Fire, FNP [place] [facility]   07/04/20 Office Visit [name] Rosita Fire, FNP [place] [facility]   02/21/20 Office Visit [name] Rosita Fire, FNP [place] [facility]   Showing recent visits within past 365 days with a meds authorizing provider and meeting all other requirements  Future Appointments  Date Type Provider Dept   01/09/21 Appointment [name] Rosita Fire, FNP [place] [facility]   Showing future appointments within next 365 days with a meds authorizing provider and meeting all other requirements

## 2020-12-07 MED ORDER — ERENUMAB-AOOE 140 MG/ML SUBCUTANEOUS AUTO-INJECTOR
SUBCUTANEOUS | 6 refills | 0 days | Status: CP
Start: 2020-12-07 — End: ?

## 2020-12-07 MED ORDER — GLIPIZIDE 10 MG TABLET
ORAL_TABLET | Freq: Two times a day (BID) | ORAL | 0 refills | 90 days | Status: CP
Start: 2020-12-07 — End: 2021-12-08

## 2020-12-07 MED ORDER — PANTOPRAZOLE 40 MG TABLET,DELAYED RELEASE
ORAL_TABLET | Freq: Two times a day (BID) | ORAL | 0 refills | 90 days | Status: CP
Start: 2020-12-07 — End: 2021-12-07

## 2020-12-07 MED ORDER — DICYCLOMINE 10 MG CAPSULE
ORAL_CAPSULE | Freq: Three times a day (TID) | ORAL | 1 refills | 90 days | Status: CP
Start: 2020-12-07 — End: 2021-12-07

## 2020-12-07 MED ORDER — ACCU-CHEK GUIDE TEST STRIPS
ORAL_STRIP | Freq: Three times a day (TID) | 1 refills | 0 days | Status: CP
Start: 2020-12-07 — End: 2021-12-07

## 2020-12-07 NOTE — Unmapped (Signed)
Patient is requesting the following refill  Requested Prescriptions     Pending Prescriptions Disp Refills   ??? erenumab-aooe 140 mg/mL AtIn 1 mL 6     Sig: Inject 140 mg under the skin every twenty-eight (28) days.   ??? glipiZIDE (GLUCOTROL) 10 MG tablet 360 tablet 1     Sig: Take 2 tablets (20 mg total) by mouth Two (2) times a day (30 minutes before a meal).   ??? pantoprazole (PROTONIX) 40 MG tablet 180 tablet 1     Sig: Take 1 tablet (40 mg total) by mouth Two (2) times a day.   ??? propranoloL (INDERAL LA) 120 mg 24 hr capsule 90 capsule 1     Sig: Take 1 capsule (120 mg total) by mouth in the morning.   ??? dicyclomine (BENTYL) 10 mg capsule 270 capsule 1     Sig: Take 1 capsule (10 mg total) by mouth Three (3) times a day.       Recent Visits  Date Type Provider Dept   10/11/20 Office Visit [name] Rosita Fire, FNP [place] [facility]   08/11/20 Office Visit [name] Rosita Fire, FNP [place] [facility]   07/04/20 Office Visit [name] Rosita Fire, FNP [place] [facility]   02/21/20 Office Visit [name] Rosita Fire, FNP [place] [facility]   Showing recent visits within past 365 days with a meds authorizing provider and meeting all other requirements  Future Appointments  Date Type Provider Dept   01/09/21 Appointment [name] Rosita Fire, FNP [place] [facility]   Showing future appointments within next 365 days with a meds authorizing provider and meeting all other requirements       Labs:   A1c:   Hemoglobin A1C (%)   Date Value   10/11/2020 11.7 (A)   09/15/2014 6.0   , Creatinine:   Creatinine (mg/dL)   Date Value   16/03/9603 0.85   01/17/2014 0.74   , Potassium:   Potassium (mmol/L)   Date Value   07/04/2020 3.6   01/17/2014 4.2    and Sodium:   Sodium (mmol/L)   Date Value   07/04/2020 138   01/17/2014 139

## 2020-12-14 NOTE — Unmapped (Signed)
[name] notified we have not received any forms from [facility] on his Dexcom.  He will get me their number.

## 2020-12-15 NOTE — Unmapped (Signed)
Called [place] at [phone] and spoke to [place].  She will fax form.  Changed order for 1 year.  They also need last note.

## 2020-12-18 DIAGNOSIS — L732 Hidradenitis suppurativa: Principal | ICD-10-CM

## 2020-12-18 MED ORDER — HUMIRA PEN CITRATE FREE 40 MG/0.4 ML
SUBCUTANEOUS | 3 refills | 0.00000 days | Status: CP
Start: 2020-12-18 — End: ?
  Filled 2021-02-16: qty 8, 28d supply, fill #0

## 2020-12-18 NOTE — Unmapped (Signed)
Patient last seen 10/15/2020.  Refills pended if appropriate.

## 2020-12-25 NOTE — Unmapped (Signed)
PA completed and approved for aimovig 12/08/20

## 2020-12-26 NOTE — Unmapped (Signed)
[facility] has made a second and final attempt to reach this patient to refill the following medication:Humira.      We have left voicemails on the following phone numbers: [phone]  and have sent a MyChart message.    Dates contacted: 7/14 and 7/19  Last scheduled delivery: 6/21    The patient may be at risk of non-compliance with this medication. The patient should call the [facility] at [phone] (option 4) to refill medication.    Clem Wisenbaker D Administrator [facility] Specialty Technician

## 2021-01-03 DIAGNOSIS — G4733 Obstructive sleep apnea (adult) (pediatric): Principal | ICD-10-CM

## 2021-01-03 MED ORDER — FLUTICASONE PROPIONATE 50 MCG/ACTUATION NASAL SPRAY,SUSPENSION
Freq: Two times a day (BID) | NASAL | 5 refills | 0 days | Status: CP
Start: 2021-01-03 — End: 2022-01-03

## 2021-01-03 NOTE — Unmapped (Signed)
Patient is requesting the following refill  Requested Prescriptions     Pending Prescriptions Disp Refills   ??? fluticasone propionate (FLONASE) 50 mcg/actuation nasal spray 16 g 5     Sig: 1 spray into each nostril Two (2) times a day.       Recent Visits  Date Type Provider Dept   10/11/20 Office Visit [name] Rosita Fire, FNP [place] [facility]   08/11/20 Office Visit [name] Rosita Fire, FNP [place] [facility]   07/04/20 Office Visit [name] Rosita Fire, FNP [place] [facility]   02/21/20 Office Visit [name] Rosita Fire, FNP [place] [facility]   Showing recent visits within past 365 days with a meds authorizing provider and meeting all other requirements  Future Appointments  Date Type Provider Dept   01/09/21 Appointment [name] Rosita Fire, FNP [place] [facility]   Showing future appointments within next 365 days with a meds authorizing provider and meeting all other requirements       Labs: Not applicable this refill

## 2021-01-04 MED ORDER — LANCETS
5 refills | 0 days | Status: CP
Start: 2021-01-04 — End: ?

## 2021-01-08 NOTE — Unmapped (Signed)
Assessment and Plan:     Logan Quinn was seen today for diabetes.    Diagnoses and all orders for this visit:    Type 2 diabetes mellitus with hyperglycemia, with long-term current use of insulin (CMS-HCC)  HGB A1c 8.5 (down from 11.7 three months ago).   DM uncontrolled. Continue glipizide 20 mg BID,??Trulicity 0.75 mg weekly, and use of OmniPod with Humalog.  Encouraged patient to continue carb controlled diet and regular exercise.   -     POCT glycosylated hemoglobin (Hb A1C); Future  -     dulaglutide 1.5 mg/0.5 mL PnIj; Inject 0.5 mL (1.5 mg total) under the skin every seven (7) days.  -     POCT glycosylated hemoglobin (Hb A1C)    Essential hypertension  BP at goal (124/76 in clinic today).   Continue propranolol 120 mg daily.   Reviewed low sodium diet and encouraged regular exercise.   Advised to continue to monitor and log at-home BP readings. Contact clinic if readings become elevated.     Acquired hypothyroidism  Last TSH wnl (03/24/19). Continue levothyroxine 50 mcg daily   Recheck TSH today. Will titrate medication as necessary.  -     TSH    Mild intermittent asthma without complication  Refilled montelukast, continue as prescribed.  -     montelukast (SINGULAIR) 10 mg tablet; Take 1 tablet (10 mg total) by mouth daily.    Seasonal allergic rhinitis, unspecified trigger  -     montelukast (SINGULAIR) 10 mg tablet; Take 1 tablet (10 mg total) by mouth daily.    Umbilical hernia without obstruction and without gangrene  Continue to monitor. Encouraged weight loss.     Low testosterone in male  Recheck testosterone today. Will contact patient with results.  -     Testosterone    I personally spent 30 minutes face-to-face and non-face-to-face in the care of this patient, which includes all pre, intra, and post visit time on the date of service.    Return in about 3 months (around 04/11/2021) for Next scheduled follow up.    HPI:      Logan Quinn  is here for   Chief Complaint   Patient presents with ??? Diabetes     Fasting.     Diabetes: Patient presents for follow up of diabetes.  A1C goal is <8.  Diabetes has customarily not been at goal (complicated by: obesity).  Current symptoms include: none. Symptoms have been well-controlled. Patient denies foot ulcerations, hyperglycemia, hypoglycemia , increase appetite, nausea, paresthesia of the feet, polydipsia, polyuria, visual disturbances, vomitting and weight loss. Evaluation to date has included: hemoglobin A1C.  Home sugars: symptomatic hypoglycemia occurs 227 currently, per CGM. Current treatment: Continued glipizide, Trulicity, and OmniPod with Humalog which has been somewhat effective.  Doing regular exercise: no. He requests to increase Trulicity.    Hypertension: Patient presents for follow-up of hypertension. Blood pressure goal < 140/90.  Hypertension has customarily been at goal complicated by obesity, DM.  Home blood pressure readings: did not bring log. Salt intake and diet: salt not added to cooking and salt shaker not on table. Associated signs and symptoms: none. Patient denies: blurred vision, chest pain, dyspnea, headache, neck aches, orthopnea, palpitations, paroxysmal nocturnal dyspnea, peripheral edema, pulsating in the ears and tiredness/fatigue. Medication compliance: taking as prescribed. He is not doing regular exercise.      Hypothyroid: Patient presents for follow-up of hypothyroidism. Current symptoms: none . Patient denies change in energy level,  diarrhea, heat / cold intolerance, nervousness, palpitations and weight changes. Symptoms have been well-controlled. He is taking medications on a regular basis. Current therapy includes: levothyroxine 50 mcg daily.     Patient requests check of testosterone level today.     He notes FHx of cancer - MGF, PGF. He is unsure type of cancer each of them had. He inquires about recommendations for monitoring cancer.     PMH includes umbilical hernia, noted on past CT abd/pelvis. He reports occasional discomfort in umbilicial region.     Answers for HPI/ROS submitted by the patient on 01/09/2021  Diabetes type: type 2  MedicAlert ID: No  Disease duration: 8 years  blurred vision: No  chest pain: No  fatigue: No  foot paresthesias: No  foot ulcerations: No  polydipsia: Yes  polyphagia: No  polyuria: No  visual change: No  weakness: No  weight loss: Yes  Symptom course: stable  confusion: No  dizziness: No  headaches: No  hunger: No  mood changes: No  nervous/anxious: No  pallor: No  seizures: No  sleepiness: No  speech difficulty: No  sweats: No  tremors: No  blackouts: No  hospitalization: No  nocturnal hypoglycemia: Yes  required assistance: No  required glucagon: No  CVA: No  heart disease: No  impotence: No  nephropathy: No  peripheral neuropathy: No  PVD: No  retinopathy: No  CAD risks: family history, obesity, sedentary lifestyle  Current treatments: insulin pump, oral agent (dual therapy)  Treatment compliance: most of the time  Given by: patient  Injection sites: arms, thighs  Monitoring compliance: fair  Blood glucose trend: decreasing steadily  breakfast time: 5-6 am  breakfast glucose level: 70-90  lunch time: 11-12 pm  lunch glucose level: 130-140  dinner time: 7-8 pm  dinner glucose level: 180-200  High score: 180-200  Overall: 180-200  Current diet: diabetic  Meal planning: carbohydrate counting  Exercise: intermittently  Dietitian visit: No  Eye exam current: Yes  Sees podiatrist: Yes       ROS:      Comprehensive 10 point ROS negative unless otherwise stated in the HPI.      [facility] Components:     Medication adherence and barriers to the treatment plan have been addressed. Opportunities to optimize healthy behaviors have been discussed. Patient / caregiver voiced understanding.    Past Medical/Surgical History:     Past Medical History:   Diagnosis Date   ??? Acne    ??? Acquired hypothyroidism 01/09/2021   ??? Allergic    ??? Anxiety    ??? Depression    ??? Diabetes mellitus (CMS-HCC) Dx 2013    Type II ??? GERD (gastroesophageal reflux disease)    ??? Gout    ??? Hypertension    ??? IBS (irritable bowel syndrome)    ??? 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 Liver disease    ??? Migraines    ??? Morbid obesity with BMI of 60.0-69.9, adult (CMS-HCC)    ??? 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 Obstructive sleep apnea    ??? OSA on CPAP    ??? Severe obstructive sleep apnea    ??? Trapezius muscle strain 12/07/2013   ??? Urinary incontinence, nocturnal enuresis    ??? Venous insufficiency      Past Surgical History:   Procedure Laterality Date   ??? EYE SURGERY  11/15   ??? PR COLONOSCOPY FLX DX W/COLLJ SPEC WHEN PFRMD N/A 03/24/2013    Procedure: COLONOSCOPY,  FLEXIBLE, PROXIMAL TO SPLENIC FLEXURE; DIAGNOSTIC, W/WO COLLECTION SPECIMEN BY BRUSH OR WASH;  Surgeon: Clint Bolder, MD;  [facility];  Service: Gastroenterology   ??? PR EYE SURG POST SGMT PROC UNLISTED Left     pneumatic retinopexy OS   ??? PR UPPER GI ENDOSCOPY,DIAGNOSIS N/A 02/02/2013    Procedure: UGI ENDO, INCLUDE ESOPHAGUS, STOMACH, & DUODENUM &/OR JEJUNUM; DX W/WO COLLECTION SPECIMN, BY BRUSH OR WASH;  Surgeon: Malcolm Metro, MD;  [facility];  Service: Gastroenterology   ??? [place] PYLORIC STENOSIS ([place] HISTORICAL RESULT)         Family History:     Family History   Problem Relation Age of Onset   ??? Cancer Maternal Grandfather    ??? Hearing loss Maternal Grandfather    ??? Cancer Paternal Grandfather    ??? COPD Paternal Grandmother    ??? Arthritis Paternal Grandmother    ??? Depression Paternal Grandmother    ??? Diabetes Mother    ??? Heart disease Mother    ??? Migraines Mother    ??? Arthritis Mother    ??? Depression Mother    ??? GER disease Mother    ??? Hypertension Mother    ??? Angina Mother    ??? COPD Mother    ??? Glaucoma Mother    ??? Hearing loss Mother    ??? Diabetes Sister    ??? Migraines Sister    ??? Asthma Sister    ??? Depression Sister    ??? Hearing loss Sister    ??? Diabetes Brother    ??? Asthma Brother    ??? Diabetes Maternal Grandmother    ??? Heart disease Maternal Grandmother    ??? Migraines Maternal Grandmother    ??? Depression Maternal Grandmother    ??? Angina Maternal Grandmother    ??? Hypertension Maternal Grandmother    ??? Stroke Maternal Grandmother    ??? Diabetes Maternal Uncle    ??? Hearing loss Maternal Uncle    ??? Diabetes Maternal Uncle         resulted in need for kidney transplant   ??? Liver disease Maternal Uncle    ??? Kidney disease Maternal Uncle         needed kidney transplant   ??? Asthma Brother    ??? No Known Problems Father    ??? No Known Problems Paternal Aunt    ??? No Known Problems Paternal Uncle    ??? No Known Problems Other    ??? Colorectal Cancer Neg Hx    ??? Esophageal cancer Neg Hx    ??? Liver cancer Neg Hx    ??? Pancreatic cancer Neg Hx    ??? Stomach cancer Neg Hx    ??? Amblyopia Neg Hx    ??? Blindness Neg Hx    ??? Retinal detachment Neg Hx    ??? Strabismus Neg Hx    ??? Macular degeneration Neg Hx    ??? Anesthesia problems Neg Hx    ??? Broken bones Neg Hx    ??? Clotting disorder Neg Hx    ??? Collagen disease Neg Hx    ??? Dislocations Neg Hx    ??? Fibromyalgia Neg Hx    ??? Gout Neg Hx    ??? Hemophilia Neg Hx    ??? Osteoporosis Neg Hx    ??? Rheumatologic disease Neg Hx    ??? Scoliosis Neg Hx    ??? Severe sprains Neg Hx    ??? Sickle  cell anemia Neg Hx    ??? Spinal Compression Fracture Neg Hx    ??? Melanoma Neg Hx    ??? Basal cell carcinoma Neg Hx    ??? Squamous cell carcinoma Neg Hx    ??? Deep vein thrombosis Neg Hx    ??? Cataracts Neg Hx    ??? Thyroid disease Neg Hx        Social History:     Social History     Tobacco Use   ??? Smoking status: Never Smoker   ??? Smokeless tobacco: Never Used   Vaping Use   ??? Vaping Use: Never used   Substance Use Topics   ??? Alcohol use: Not Currently     Comment: rare social   ??? Drug use: Never       Allergies:     Erythromycin, Penicillins, Sulfa (sulfonamide antibiotics), Other, Erythromycin base, and Sulfasalazine    Current Medications:     Current Outpatient Medications   Medication Sig Dispense Refill   ??? acetaminophen (TYLENOL) 500 MG tablet Take 2 tablets (1,000 mg total) by mouth Three (3) times a day. 30 tablet 0   ??? ADVAIR DISKUS 500-50 mcg/dose diskus Inhale 1 puff 2 (two) times a day. 60 each 11   ??? albuterol 2.5 mg /3 mL (0.083 %) nebulizer solution Inhale 3 mL (2.5 mg total) by nebulization every four (4) hours as needed for wheezing. 180 mL 2   ??? albuterol HFA 90 mcg/actuation inhaler Inhale 2 puffs every six (6) hours as needed for wheezing. 8 g 0   ??? ALPRAZolam (XANAX) 0.5 MG tablet Take 1 tablet (0.5 mg total) by mouth Three (3) times a day. 90 tablet 1   ??? blood sugar diagnostic (ACCU-CHEK GUIDE TEST STRIPS) Strp by Other route Three (3) times a day before meals. 300 strip 1   ??? blood-glucose meter kit Use as directed 1 each 0   ??? cetirizine (ZYRTEC) 10 MG tablet Take 1 tablet (10 mg total) by mouth nightly as needed for allergies. 90 tablet 1   ??? clindamycin (CLEOCIN T) 1 % external solution clindamycin phosphate 1 % topical solution   APPLY TOPICALLY TO GROIN AREA TWICE A DAY     ??? clindamycin (CLEOCIN T) 1 % lotion APPLY TOPICALLY AT NIGHT TO INFLAMED AREAS AS NEEDED FOR FLARES     ??? clobetasoL (TEMOVATE) 0.05 % ointment Apply daily to painful affected areas if needed for flares, then stop 15 g 1   ??? cyclobenzaprine (FLEXERIL) 10 MG tablet Take 1 tablet (10 mg total) by mouth Three (3) times a day as needed for muscle spasms. 60 tablet 0   ??? dextroamphetamine-amphetamine (ADDERALL XR) 20 MG 24 hr capsule Take 1 capsule by mouth every morning.     ??? dicyclomine (BENTYL) 10 mg capsule Take 1 capsule (10 mg total) by mouth Three (3) times a day. 270 capsule 1   ??? diphenoxylate-atropine (LOMOTIL) 2.5-0.025 mg per tablet Take 1 tablet by mouth two (2) times a day as needed for diarrhea. 30 tablet 5   ??? DULoxetine (CYMBALTA) 60 MG capsule Take 1 capsule (60 mg total) by mouth Two (2) times a day. 180 capsule 0   ??? empty container (SHARPS CONTAINER) Misc Use as directed to dispose of Humira needles 1 each 2   ??? erenumab-aooe 140 mg/mL AtIn Inject 140 mg under the skin every twenty-eight (28) days. (Patient taking differently: Inject 140 mg under the skin every twenty-eight (28) days. Amovig) 1 mL  6   ??? famotidine (PEPCID) 20 MG tablet Take 1 tablet (20 mg total) by mouth Two (2) times a day. 180 tablet 2   ??? fluticasone propionate (FLONASE) 50 mcg/actuation nasal spray 1 spray into each nostril Two (2) times a day. 16 g 5   ??? glipiZIDE (GLUCOTROL) 10 MG tablet Take 2 tablets (20 mg total) by mouth Two (2) times a day (30 minutes before a meal). 360 tablet 0   ??? HUMALOG U-100 INSULIN 100 unit/mL injection Inject 120 units under the skin daily via Omnipod 40 mL 5   ??? HUMIRA PEN CITRATE FREE 40 MG/0.4 ML Inject the contents of 2 pens (80mg  total) under the skin every seven (7) days. 8 each 3   ??? hydrocortisone (PROCTOSOL HC) 2.5 % rectal cream Insert 1 application into the rectum two (2) times a day as needed. 28.35 g 2   ??? hydrOXYzine (ATARAX) 25 MG tablet Take 1 tablet (25 mg total) by mouth Three (3) times a day as needed. 120 tablet 1   ??? ibuprofen (ADVIL,MOTRIN) 800 MG tablet Take 1 tablet (800 mg total) by mouth every eight (8) hours as needed. 90 tablet 1   ??? insulin pump cartridge Crtg Inject 120 Units under the skin daily. 5 each 5   ??? insulin syringe-needle U-100 1 mL 31 gauge x 5/16 (8 mm) Syrg insulin syringe U-100 with needle 1 mL 31 gauge x 5/16   USE UP TO 5 TIMES A DAY TO ADMINISTER INSULIN     ??? ketoconazole (NIZORAL) 2 % cream Apply 1 application topically daily. To the affected area of the groin 60 g 11   ??? lancets (ACCU-CHEK FASTCLIX LANCET DRUM) Misc Use to check blood sugars 3 times a day before meals or as directed 200 each 5   ??? lancets Misc Type 2 diabetes, poorly controlled 250.02   Test 2 times daily 100 each 11   ??? levothyroxine (SYNTHROID) 50 MCG tablet Take 1 tablet (50 mcg total) by mouth daily. 90 tablet 1   ??? lidocaine (XYLOCAINE) 5 % ointment lidocaine 5 % topical ointment     ??? nystatin (MYCOSTATIN) 100,000 unit/gram powder Apply to affected area 3 times daily 30 g 1   ??? ondansetron (ZOFRAN-ODT) 4 MG disintegrating tablet Take 1 tablet (4 mg total) by mouth every eight (8) hours as needed for nausea for up to 15 doses. 15 tablet 1   ??? oxybutynin (DITROPAN) 5 MG tablet Take 1 tablet (5 mg total) by mouth Two (2) times a day. 180 tablet 1   ??? pantoprazole (PROTONIX) 40 MG tablet Take 1 tablet (40 mg total) by mouth Two (2) times a day. 180 tablet 0   ??? polyethylene glycol (GLYCOLAX) 17 gram/dose powder take 17GM (DISSOLVED IN WATER) by mouth once daily     ??? PROAIR HFA 90 mcg/actuation inhaler Inhale 2 puffs every four (4) hours as needed for wheezing. 3 Inhaler 1   ??? promethazine (PHENERGAN) 25 MG tablet Take 1-2 tablets (25-50 mg total) by mouth nightly as needed for nausea. 30 tablet 3   ??? propranoloL (INDERAL LA) 120 mg 24 hr capsule Take 1 capsule (120 mg total) by mouth in the morning. 90 capsule 0   ??? sour cherry extract (TART CHERRY EXTRACT) 1,000 mg cap      ??? topiramate (TOPAMAX) 50 MG tablet Take 1.5 tablets (75 mg total) by mouth Two (2) times a day. 270 tablet 1   ??? traZODone (DESYREL) 100 MG  tablet Take 2 tablets (200 mg total) by mouth nightly. (Patient taking differently: Take 100 mg by mouth nightly.) 180 tablet 0   ??? dulaglutide 1.5 mg/0.5 mL PnIj Inject 0.5 mL (1.5 mg total) under the skin every seven (7) days. 2 mL 1   ??? montelukast (SINGULAIR) 10 mg tablet Take 1 tablet (10 mg total) by mouth daily. 90 tablet 3     No current facility-administered medications for this visit.       Health Maintenance:     Health Maintenance   Topic Date Due   ??? Pneumococcal Vaccine 0-64 (2 - PCV) 12/30/2013   ??? COVID-19 Vaccine (3 - Pfizer risk series) 01/24/2021   ??? Influenza Vaccine (1) 02/08/2021   ??? Hemoglobin A1c  04/11/2021   ??? Serum Creatinine Monitoring  07/04/2021   ??? Potassium Monitoring  07/04/2021   ??? Retinal Eye Exam  09/20/2021   ??? Urine Albumin/Creatinine Ratio  10/11/2021   ??? Foot Exam  01/09/2022   ??? DTaP/Tdap/Td Vaccines (4 - Td or Tdap) 07/17/2023   ??? COPD Spirometry  05/25/2025   ??? Hepatitis C Screen  Completed       Immunizations:     Immunization History   Administered Date(s) Administered   ??? COVID-19 VACC,MRNA,(PFIZER)(PF)(IM) 12/04/2020, 12/27/2020   ??? Hepatitis B Vaccine, Unspecified Formulation 12/27/1999, 04/29/2000   ??? Hepatitis B, Adult 03/05/2013, 04/05/2013, 09/03/2013   ??? INFLUENZA INJ MDCK PF, QUAD,(FLUCELVAX)(53MO AND UP EGG FREE) 03/31/2019   ??? INFLUENZA TIV (TRI) PF (IM) 10/08/2011   ??? Influenza Vaccine Quad (IIV4 PF) 38mo+ injectable 03/05/2013, 03/18/2014, 03/22/2015, 03/05/2016, 03/19/2017, 02/23/2018   ??? Influenza Virus Vaccine, unspecified formulation 03/19/2017, 02/23/2018, 03/25/2019, 03/10/2020   ??? PNEUMOCOCCAL POLYSACCHARIDE 23 12/30/2012   ??? PPD Test 10/28/2016   ??? TdaP 07/18/2008, 07/16/2013   ??? Tetanus-Diptheria Toxoids-TD(TDVAX),Asdorbed,2LF(IM) 04/29/2000     I have reviewed and (if needed) updated the patient's problem list, medications, allergies, past medical and surgical history, social and family history.     Vital Signs:     Wt Readings from Last 3 Encounters:   01/09/21 (!) 224.9 kg (495 lb 12 oz)   10/11/20 (!) 223.2 kg (492 lb)   08/11/20 (!) 227.7 kg (502 lb)     Temp Readings from Last 3 Encounters:   01/09/21 37.3 ??C (99.1 ??F) (Oral)   10/11/20 36.8 ??C (98.2 ??F) (Oral)   08/11/20 36.8 ??C (98.3 ??F) (Oral)     BP Readings from Last 3 Encounters:   01/09/21 124/76   10/11/20 130/68   08/11/20 130/72     Pulse Readings from Last 3 Encounters:   01/09/21 65   10/11/20 78   08/11/20 72     Estimated body mass index is 69.14 kg/m?? as calculated from the following:    Height as of this encounter: 180.3 cm (5' 11).    Weight as of this encounter: 224.9 kg (495 lb 12 oz).  Facility age limit for growth percentiles is 20 years.      Objective:      General: Alert and oriented x3. Well-appearing. No acute distress. Morbidly obese.  HEENT:  Normocephalic.  Atraumatic. Conjunctiva and sclera normal. OP MMM without lesions.   Neck:  Supple. No thyroid enlargement. No adenopathy.   Heart:  Regular rate and rhythm. Normal S1, S2. No murmurs, rubs or gallops.   Lungs:  No respiratory distress.  Lungs clear to auscultation. No wheezes, rhonchi, or rales.   GI/GU:  Soft, +BS, nondistended, non-TTP. Small  visible umbilical hernia superior to umbilicus.  Extremities:  No edema. Peripheral pulses normal.   Skin:  Warm, dry. No rash or lesions present.   Neuro:  Non-focal. No obvious weakness.   Psych:  Affect normal, eye contact good, speech clear and coherent.      I attest that I, Bayard Hugger, personally documented this note while acting as scribe for [name], FNP.      Bayard Hugger, Scribe.  01/09/2021     The documentation recorded by the scribe accurately reflects the service I personally performed and the decisions made by me.    [name], FNP

## 2021-01-09 ENCOUNTER — Encounter: Admit: 2021-01-09 | Discharge: 2021-01-10 | Payer: MEDICAID | Attending: Family | Primary: Family

## 2021-01-09 DIAGNOSIS — J302 Other seasonal allergic rhinitis: Principal | ICD-10-CM

## 2021-01-09 DIAGNOSIS — I10 Essential (primary) hypertension: Principal | ICD-10-CM

## 2021-01-09 DIAGNOSIS — E1165 Type 2 diabetes mellitus with hyperglycemia: Principal | ICD-10-CM

## 2021-01-09 DIAGNOSIS — R7989 Other specified abnormal findings of blood chemistry: Principal | ICD-10-CM

## 2021-01-09 DIAGNOSIS — Z794 Long term (current) use of insulin: Principal | ICD-10-CM

## 2021-01-09 DIAGNOSIS — E039 Hypothyroidism, unspecified: Principal | ICD-10-CM

## 2021-01-09 DIAGNOSIS — J4520 Mild intermittent asthma, uncomplicated: Principal | ICD-10-CM

## 2021-01-09 DIAGNOSIS — K429 Umbilical hernia without obstruction or gangrene: Principal | ICD-10-CM

## 2021-01-09 LAB — TSH: THYROID STIMULATING HORMONE: 1.637 u[IU]/mL (ref 0.550–4.780)

## 2021-01-09 LAB — TESTOSTERONE: TESTOSTERONE TOTAL: 105 ng/dL — ABNORMAL LOW

## 2021-01-09 MED ORDER — DULAGLUTIDE 1.5 MG/0.5 ML SUBCUTANEOUS PEN INJECTOR
SUBCUTANEOUS | 1 refills | 28 days | Status: CP
Start: 2021-01-09 — End: ?

## 2021-01-09 MED ORDER — MONTELUKAST 10 MG TABLET
ORAL_TABLET | Freq: Every day | ORAL | 3 refills | 90 days | Status: CP
Start: 2021-01-09 — End: 2022-01-10

## 2021-01-09 NOTE — Unmapped (Signed)
error 

## 2021-01-11 NOTE — Unmapped (Signed)
FYI:  RT leg swollen above ankle, red, warm, painful and changing color.  He states it is where 2 veins meet.  Hx of cellulitis.  Has Vascular doctor but wanted to see if Lorina Rabon would start him on a course of antibiotics.  They've used lymph pump and leg wraps.   No openings here tomorrow and recommended with those sx he should go to ED now.  He states he is at camp with his son in [place].  Declines going to ED he states he has been checked for blood clots before and they were negative.  Encouraged him to at least go to Urgent Care now.  He was thinking he could go Saturday or Sunday.  Reiterated he should not put this off and needs to go today.

## 2021-01-15 ENCOUNTER — Ambulatory Visit: Payer: Medicare Other

## 2021-01-18 ENCOUNTER — Ambulatory Visit
Admit: 2021-01-18 | Discharge: 2021-01-18 | Disposition: A | Discharge status: Home / Self Care | Payer: MEDICAID | Attending: Emergency Medicine

## 2021-01-18 LAB — BASIC METABOLIC PANEL
ANION GAP: 6 mmol/L (ref 5–14)
BLOOD UREA NITROGEN: 11 mg/dL (ref 9–23)
BUN / CREAT RATIO: 12
CALCIUM: 9.1 mg/dL (ref 8.7–10.4)
CHLORIDE: 102 mmol/L (ref 98–107)
CO2: 28.7 mmol/L (ref 20.0–31.0)
CREATININE: 0.92 mg/dL
EGFR CKD-EPI (2021) MALE: >90 mL/min/{1.73_m2} (ref >=60)
GLUCOSE RANDOM: 247 mg/dL — ABNORMAL HIGH (ref 70–179)
POTASSIUM: 4.5 mmol/L (ref 3.4–4.8)
SODIUM: 137 mmol/L (ref 135–145)

## 2021-01-18 LAB — CBC W/ AUTO DIFF
BASOPHILS ABSOLUTE COUNT: 0.1 10*9/L (ref 0.0–0.1)
BASOPHILS RELATIVE PERCENT: 0.9 %
EOSINOPHILS ABSOLUTE COUNT: 0.1 10*9/L (ref 0.0–0.5)
EOSINOPHILS RELATIVE PERCENT: 1.3 %
HEMATOCRIT: 42.2 % (ref 39.0–48.0)
HEMOGLOBIN: 14.8 g/dL (ref 12.9–16.5)
LYMPHOCYTES ABSOLUTE COUNT: 3.4 10*9/L (ref 1.1–3.6)
LYMPHOCYTES RELATIVE PERCENT: 34.7 %
MEAN CORPUSCULAR HEMOGLOBIN CONC: 35.2 g/dL (ref 32.0–36.0)
MEAN CORPUSCULAR HEMOGLOBIN: 33.4 pg — ABNORMAL HIGH (ref 25.9–32.4)
MEAN CORPUSCULAR VOLUME: 95 fL (ref 77.6–95.7)
MEAN PLATELET VOLUME: 10 fL (ref 6.8–10.7)
MONOCYTES ABSOLUTE COUNT: 1 10*9/L — ABNORMAL HIGH (ref 0.3–0.8)
MONOCYTES RELATIVE PERCENT: 9.7 %
NEUTROPHILS ABSOLUTE COUNT: 5.2 10*9/L (ref 1.8–7.8)
NEUTROPHILS RELATIVE PERCENT: 53.4 %
NUCLEATED RED BLOOD CELLS: 0 /100{WBCs} (ref <=4)
PLATELET COUNT: 175 10*9/L (ref 150–450)
RED BLOOD CELL COUNT: 4.44 10*12/L (ref 4.26–5.60)
RED CELL DISTRIBUTION WIDTH: 14 % (ref 12.2–15.2)
WBC ADJUSTED: 9.8 10*9/L (ref 3.6–11.2)

## 2021-01-18 LAB — APTT
APTT: 24.7 s — ABNORMAL LOW (ref 24.9–36.9)
HEPARIN CORRELATION: <0.2

## 2021-01-18 LAB — D-DIMER, QUANTITATIVE: D-DIMER QUANTITATIVE (CW,ML,HL,HS,CH,JS,JC): 319 ng{FEU}/mL (ref <=500)

## 2021-01-18 LAB — PROTIME-INR
INR: 1.01
PROTIME: 11.8 s (ref 10.3–13.4)

## 2021-01-18 MED ORDER — CEPHALEXIN 500 MG CAPSULE
ORAL_CAPSULE | Freq: Four times a day (QID) | ORAL | 0 refills | 7 days | Status: CP
Start: 2021-01-18 — End: 2021-01-25

## 2021-01-18 MED ORDER — HYDROCODONE 5 MG-ACETAMINOPHEN 325 MG TABLET
ORAL_TABLET | ORAL | 0 refills | 2.00000 days | Status: CP | PRN
Start: 2021-01-18 — End: 2021-01-23

## 2021-01-18 MED ADMIN — acetaminophen (TYLENOL) tablet 650 mg: 650 mg | ORAL | @ 08:00:00 | Stop: 2021-01-18

## 2021-01-18 MED ADMIN — cephalexin (KEFLEX) capsule 500 mg: 500 mg | ORAL | @ 08:00:00 | Stop: 2021-01-18

## 2021-01-18 MED ADMIN — HYDROcodone-acetaminophen (NORCO) 5-325 mg per tablet 1 tablet: 1 | ORAL | @ 08:00:00 | Stop: 2021-01-18

## 2021-01-18 NOTE — Unmapped (Signed)
Pt with history of venous insuffiencey and lymph edema presents with right lower leg swelling that began over the weekend. Pt reports pain 7/10 not improved with Tylenol or Ibuprofen. Pt has been wearing compression stockings as well.

## 2021-01-18 NOTE — Unmapped (Signed)
[facility] Emergency Department Provider Note      ED Course, Assessment and Plan     Initial Clinical Impression:    BP 150/77  - Pulse 72  - Temp 37.1 ??C (98.7 ??F) (Oral)  - Resp 18  - SpO2 96%     Diagnostic orders as below.    Orders Placed This Encounter   Procedures   ??? D-Dimer, Quantitative   ??? CBC w/ Differential   ??? Basic metabolic panel   ??? Protime-INR   ??? APTT     ED Course:  ED Course as of 01/18/21 1418   Thu Jan 18, 2021   3310 40 year old male with a history of diabetes, lymphedema, venous insufficiency who presents emergency department today for evaluation of 1 week of progressive right lower extremity redness, swelling, and pain.    On exam, the patient is obese, chronically ill-appearing, no acute distress, resting comfortably in bed.  Cardiopulmonary exams unremarkable.  Abdominal exam is benign.  Examination of right lower extremity  with edema, erythema, and tenderness to the distal right shin, concerning for cellulitis, see below for further examination details.    Patient potentially with an early cellulitis versus worsening venous stasis dermatitis.  DVT remains on differential.  PVLs unavailable at this time at night.  Patient does not have any clinical evidence of pulmonary embolism.    Will initiate on Keflex and sent outpatient course of this to his pharmacy.  Ordered basic labs, D-dimer, coags.  If D-dimer is positive, will refer for outpatient PVL.  Patient's primary care provider is within the [facility] system.  Advised him to contact his primary care provider to discuss today's assessment, initiation of antibiotics, and to follow-up on outpatient PVL if necessary.  Will provide Norco and Tylenol in the ED for pain.  Will likely discharge home with short course of Norco.  Discussed use of over-the-counter analgesics prior to Norco at home.  Encouraged continued use of compression stockings.  Return precautions provided.   0418 WBC: 9.8   0418 HGB: 14.8   0507 D-Dimer: 319  No need to refer to PVL clinic.   0534 Discussed overall reassuring work-up with the patient including his D-dimer value.  Will discharge home as planned with course of Keflex.  Encourage the patient to reach out  to his primary care provider later today for follow-up.  Return precautions discussed and placed in discharge instructions.  Patient is agreeable to this plan and amenable to discharge.     _____________________________________________________________________    The case was discussed with attending physician who is in agreement with the above assessment and plan    Additional Medical Decision Making     I have reviewed the vital signs, nursing notes, and prior medical records if available. Labs, radiology results, and EKGs that were available during my care of the patient were independently reviewed by me and considered in my medical decision making.    History     CHIEF COMPLAINT:   Chief Complaint   Patient presents with   ??? Leg Swelling       HPI: Logan Quinn is a 40 y.o. male with a PMH of T2DM, IBS, OSA (on CPAP), hypothyroidism, HTN, lymphedema, and depression presenting to the ED for leg swelling. Patient reports 1 week (since 8/04) of progressive right leg swelling, pain, redness and warmth extending from the mid-shin down to the foot. He is unsure of any precipitating injuries / breaks in the skin. He  has taken Tylenol and Ibuprofen with minimal relief. Compression stockings have not helped with the swelling. Patient says these symptoms feel consistent with prior bouts of cellulitis. Otherwise, medical history includes lymphedema and venous insuffiencey. He denies history of blood clots. He is not anticoagulated. Patient denies any recent medication changes. No recent hospitalizations, extensive travel, or prolonged bed rest. He does have a history of endovenous laser ablation in this leg. He denies tobacco use. No history of hormone therapy. No fever, chest pain, shortness of breath, abdominal pain, nausea, vomiting, or diarrhea.     PAST MEDICAL HISTORY/PAST SURGICAL HISTORY:   Past Medical History:   Diagnosis Date   ??? Acne    ??? Acquired hypothyroidism 01/09/2021   ??? Allergic    ??? Anxiety    ??? Depression    ??? Diabetes mellitus (CMS-HCC) Dx 2013    Type II   ??? GERD (gastroesophageal reflux disease)    ??? Gout    ??? Hypertension    ??? IBS (irritable bowel syndrome)    ??? 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 Liver disease    ??? Migraines    ??? Morbid obesity with BMI of 60.0-69.9, adult (CMS-HCC)    ??? 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 Obstructive sleep apnea    ??? OSA on CPAP    ??? Severe obstructive sleep apnea    ??? Trapezius muscle strain 12/07/2013   ??? Urinary incontinence, nocturnal enuresis    ??? Venous insufficiency      Past Surgical History:   Procedure Laterality Date   ??? EYE SURGERY  11/15   ??? PR COLONOSCOPY FLX DX W/COLLJ SPEC WHEN PFRMD N/A 03/24/2013    Procedure: COLONOSCOPY, FLEXIBLE, PROXIMAL TO SPLENIC FLEXURE; DIAGNOSTIC, W/WO COLLECTION SPECIMEN BY BRUSH OR WASH;  Surgeon: Clint Bolder, MD;  [facility];  Service: Gastroenterology   ??? PR EYE SURG POST SGMT PROC UNLISTED Left     pneumatic retinopexy OS   ??? PR UPPER GI ENDOSCOPY,DIAGNOSIS N/A 02/02/2013    Procedure: UGI ENDO, INCLUDE ESOPHAGUS, STOMACH, & DUODENUM &/OR JEJUNUM; DX W/WO COLLECTION SPECIMN, BY BRUSH OR WASH;  Surgeon: Malcolm Metro, MD;  Location: [facility];  Service: Gastroenterology   ??? [place] PYLORIC STENOSIS ([facility] HISTORICAL RESULT)       MEDICATIONS:   No current facility-administered medications for this encounter.    Current Outpatient Medications:   ???  acetaminophen (TYLENOL) 500 MG tablet, Take 2 tablets (1,000 mg total) by mouth Three (3) times a day., Disp: 30 tablet, Rfl: 0  ???  ADVAIR DISKUS 500-50 mcg/dose diskus, Inhale 1 puff 2 (two) times a day., Disp: 60 each, Rfl: 11  ???  albuterol 2.5 mg /3 mL (0.083 %) nebulizer solution, Inhale 3 mL (2.5 mg total) by nebulization every four (4) hours as needed for wheezing., Disp: 180 mL, Rfl: 2  ???  albuterol HFA 90 mcg/actuation inhaler, Inhale 2 puffs every six (6) hours as needed for wheezing., Disp: 8 g, Rfl: 0  ???  ALPRAZolam (XANAX) 0.5 MG tablet, Take 1 tablet (0.5 mg total) by mouth Three (3) times a day., Disp: 90 tablet, Rfl: 1  ???  blood sugar diagnostic (ACCU-CHEK GUIDE TEST STRIPS) Strp, by Other route Three (3) times a day before meals., Disp: 300 strip, Rfl: 1  ???  blood-glucose meter kit, Use as directed, Disp: 1 each, Rfl: 0  ???  cephalexin (KEFLEX) 500 MG capsule, Take 1 capsule (  500 mg total) by mouth four (4) times a day for 7 days., Disp: 28 capsule, Rfl: 0  ???  cetirizine (ZYRTEC) 10 MG tablet, Take 1 tablet (10 mg total) by mouth nightly as needed for allergies., Disp: 90 tablet, Rfl: 1  ???  clindamycin (CLEOCIN T) 1 % external solution, clindamycin phosphate 1 % topical solution  APPLY TOPICALLY TO GROIN AREA TWICE A DAY, Disp: , Rfl:   ???  clindamycin (CLEOCIN T) 1 % lotion, APPLY TOPICALLY AT NIGHT TO INFLAMED AREAS AS NEEDED FOR FLARES, Disp: , Rfl:   ???  clobetasoL (TEMOVATE) 0.05 % ointment, Apply daily to painful affected areas if needed for flares, then stop, Disp: 15 g, Rfl: 1  ???  cyclobenzaprine (FLEXERIL) 10 MG tablet, Take 1 tablet (10 mg total) by mouth Three (3) times a day as needed for muscle spasms., Disp: 60 tablet, Rfl: 0  ???  dextroamphetamine-amphetamine (ADDERALL XR) 20 MG 24 hr capsule, Take 1 capsule by mouth every morning., Disp: , Rfl:   ???  dicyclomine (BENTYL) 10 mg capsule, Take 1 capsule (10 mg total) by mouth Three (3) times a day., Disp: 270 capsule, Rfl: 1  ???  diphenoxylate-atropine (LOMOTIL) 2.5-0.025 mg per tablet, Take 1 tablet by mouth two (2) times a day as needed for diarrhea., Disp: 30 tablet, Rfl: 5  ???  dulaglutide 1.5 mg/0.5 mL PnIj, Inject 0.5 mL (1.5 mg total) under the skin every seven (7) days., Disp: 2 mL, Rfl: 1  ???  DULoxetine (CYMBALTA) 60 MG capsule, Take 1 capsule (60 mg total) by mouth Two (2) times a day., Disp: 180 capsule, Rfl: 0  ???  empty container (SHARPS CONTAINER) Misc, Use as directed to dispose of Humira needles, Disp: 1 each, Rfl: 2  ???  erenumab-aooe 140 mg/mL AtIn, Inject 140 mg under the skin every twenty-eight (28) days. (Patient taking differently: Inject 140 mg under the skin every twenty-eight (28) days. Amovig), Disp: 1 mL, Rfl: 6  ???  famotidine (PEPCID) 20 MG tablet, Take 1 tablet (20 mg total) by mouth Two (2) times a day., Disp: 180 tablet, Rfl: 2  ???  fluticasone propionate (FLONASE) 50 mcg/actuation nasal spray, 1 spray into each nostril Two (2) times a day., Disp: 16 g, Rfl: 5  ???  glipiZIDE (GLUCOTROL) 10 MG tablet, Take 2 tablets (20 mg total) by mouth Two (2) times a day (30 minutes before a meal)., Disp: 360 tablet, Rfl: 0  ???  HUMALOG U-100 INSULIN 100 unit/mL injection, Inject 120 units under the skin daily via Omnipod, Disp: 40 mL, Rfl: 5  ???  HUMIRA PEN CITRATE FREE 40 MG/0.4 ML, Inject the contents of 2 pens (80mg  total) under the skin every seven (7) days., Disp: 8 each, Rfl: 3  ???  HYDROcodone-acetaminophen (NORCO) 5-325 mg per tablet, Take 1 tablet by mouth every four (4) hours as needed for pain for up to 5 days., Disp: 10 tablet, Rfl: 0  ???  hydrocortisone (PROCTOSOL HC) 2.5 % rectal cream, Insert 1 application into the rectum two (2) times a day as needed., Disp: 28.35 g, Rfl: 2  ???  hydrOXYzine (ATARAX) 25 MG tablet, Take 1 tablet (25 mg total) by mouth Three (3) times a day as needed., Disp: 120 tablet, Rfl: 1  ???  ibuprofen (ADVIL,MOTRIN) 800 MG tablet, Take 1 tablet (800 mg total) by mouth every eight (8) hours as needed., Disp: 90 tablet, Rfl: 1  ???  insulin pump cartridge Crtg, Inject 120 Units under the  skin daily., Disp: 5 each, Rfl: 5  ???  insulin syringe-needle U-100 1 mL 31 gauge x 5/16 (8 mm) Syrg, insulin syringe U-100 with needle 1 mL 31 gauge x 5/16  USE UP TO 5 TIMES A DAY TO ADMINISTER INSULIN, Disp: , Rfl:   ???  ketoconazole (NIZORAL) 2 % cream, Apply 1 application topically daily. To the affected area of the groin, Disp: 60 g, Rfl: 11  ???  lancets (ACCU-CHEK FASTCLIX LANCET DRUM) Misc, Use to check blood sugars 3 times a day before meals or as directed, Disp: 200 each, Rfl: 5  ???  lancets Misc, Type 2 diabetes, poorly controlled 250.02  Test 2 times daily, Disp: 100 each, Rfl: 11  ???  levothyroxine (SYNTHROID) 50 MCG tablet, Take 1 tablet (50 mcg total) by mouth daily., Disp: 90 tablet, Rfl: 1  ???  lidocaine (XYLOCAINE) 5 % ointment, lidocaine 5 % topical ointment, Disp: , Rfl:   ???  montelukast (SINGULAIR) 10 mg tablet, Take 1 tablet (10 mg total) by mouth daily., Disp: 90 tablet, Rfl: 3  ???  ondansetron (ZOFRAN-ODT) 4 MG disintegrating tablet, Take 1 tablet (4 mg total) by mouth every eight (8) hours as needed for nausea for up to 15 doses., Disp: 15 tablet, Rfl: 1  ???  oxybutynin (DITROPAN) 5 MG tablet, Take 1 tablet (5 mg total) by mouth Two (2) times a day., Disp: 180 tablet, Rfl: 1  ???  pantoprazole (PROTONIX) 40 MG tablet, Take 1 tablet (40 mg total) by mouth Two (2) times a day., Disp: 180 tablet, Rfl: 0  ???  polyethylene glycol (GLYCOLAX) 17 gram/dose powder, take 17GM (DISSOLVED IN WATER) by mouth once daily, Disp: , Rfl:   ???  PROAIR HFA 90 mcg/actuation inhaler, Inhale 2 puffs every four (4) hours as needed for wheezing., Disp: 3 Inhaler, Rfl: 1  ???  promethazine (PHENERGAN) 25 MG tablet, Take 1-2 tablets (25-50 mg total) by mouth nightly as needed for nausea., Disp: 30 tablet, Rfl: 3  ???  propranoloL (INDERAL LA) 120 mg 24 hr capsule, Take 1 capsule (120 mg total) by mouth in the morning., Disp: 90 capsule, Rfl: 0  ???  sour cherry extract (TART CHERRY EXTRACT) 1,000 mg cap, , Disp: , Rfl:   ???  topiramate (TOPAMAX) 50 MG tablet, Take 1.5 tablets (75 mg total) by mouth Two (2) times a day., Disp: 270 tablet, Rfl: 1  ???  traZODone (DESYREL) 100 MG tablet, Take 2 tablets (200 mg total) by mouth nightly. (Patient taking differently: Take 100 mg by mouth nightly.), Disp: 180 tablet, Rfl: 0    ALLERGIES:   Erythromycin, Penicillins, Sulfa (sulfonamide antibiotics), Other, Erythromycin base, and Sulfasalazine    SOCIAL HISTORY:   Social History     Tobacco Use   ??? Smoking status: Never Smoker   ??? Smokeless tobacco: Never Used   Substance Use Topics   ??? Alcohol use: Not Currently     Comment: rare social     FAMILY HISTORY:  Family History   Problem Relation Age of Onset   ??? Cancer Maternal Grandfather    ??? Hearing loss Maternal Grandfather    ??? Cancer Paternal Grandfather    ??? COPD Paternal Grandmother    ??? Arthritis Paternal Grandmother    ??? Depression Paternal Grandmother    ??? Diabetes Mother    ??? Heart disease Mother    ??? Migraines Mother    ??? Arthritis Mother    ??? Depression Mother    ???  GER disease Mother    ??? Hypertension Mother    ??? Angina Mother    ??? COPD Mother    ??? Glaucoma Mother    ??? Hearing loss Mother    ??? Diabetes Sister    ??? Migraines Sister    ??? Asthma Sister    ??? Depression Sister    ??? Hearing loss Sister    ??? Diabetes Brother    ??? Asthma Brother    ??? Diabetes Maternal Grandmother    ??? Heart disease Maternal Grandmother    ??? Migraines Maternal Grandmother    ??? Depression Maternal Grandmother    ??? Angina Maternal Grandmother    ??? Hypertension Maternal Grandmother    ??? Stroke Maternal Grandmother    ??? Diabetes Maternal Uncle    ??? Hearing loss Maternal Uncle    ??? Diabetes Maternal Uncle         resulted in need for kidney transplant   ??? Liver disease Maternal Uncle    ??? Kidney disease Maternal Uncle         needed kidney transplant   ??? Asthma Brother    ??? No Known Problems Father    ??? No Known Problems Paternal Aunt    ??? No Known Problems Paternal Uncle    ??? No Known Problems Other    ??? Colorectal Cancer Neg Hx    ??? Esophageal cancer Neg Hx    ??? Liver cancer Neg Hx    ??? Pancreatic cancer Neg Hx    ??? Stomach cancer Neg Hx    ??? Amblyopia Neg Hx    ??? Blindness Neg Hx    ??? Retinal detachment Neg Hx    ??? Strabismus Neg Hx    ??? Macular degeneration Neg Hx    ??? Anesthesia problems Neg Hx    ??? Broken bones Neg Hx    ??? Clotting disorder Neg Hx    ??? Collagen disease Neg Hx    ??? Dislocations Neg Hx    ??? Fibromyalgia Neg Hx    ??? Gout Neg Hx    ??? Hemophilia Neg Hx    ??? Osteoporosis Neg Hx    ??? Rheumatologic disease Neg Hx    ??? Scoliosis Neg Hx    ??? Severe sprains Neg Hx    ??? Sickle cell anemia Neg Hx    ??? Spinal Compression Fracture Neg Hx    ??? Melanoma Neg Hx    ??? Basal cell carcinoma Neg Hx    ??? Squamous cell carcinoma Neg Hx    ??? Deep vein thrombosis Neg Hx    ??? Cataracts Neg Hx    ??? Thyroid disease Neg Hx       Review of Systems  A 10 point review of systems was performed and is negative other than positive elements noted in HPI     Physical Exam     VITAL SIGNS:    BP 150/77  - Pulse 72  - Temp 37.1 ??C (98.7 ??F) (Oral)  - Resp 18  - SpO2 96%     Constitutional: Alert and oriented. Obese and chronically-ill appearing. In no distress.  Eyes: Conjunctivae are normal.  ENT       Head: Normocephalic and atraumatic.       Nose: No congestion.       Mouth/Throat: Mucous membranes are moist.       Neck: No stridor.  Cardiovascular: S1, S2,  Normal and symmetric distal pulses are present  in all extremities. Warm and well perfused. DP pulses 2+ bilaterally.  Respiratory: Normal respiratory effort. Breath sounds are normal.  Gastrointestinal: Soft and nondistended. Nontender.  Musculoskeletal: Lymphedema and chronic venous stasis changes to BLEs. RLE is sightly more edematous than the LLE. Normal range of motion in all extremities.  Neurologic: Normal speech and language. Alert and oriented x3. No gross focal neurologic deficits are appreciated.  Skin: Mild circumferential erythema to the RLE extending from the ankle to the mid-shin with associated tenderness. Skin is otherwise dry and intact. No rash noted.  Psychiatric: Mood and affect are normal. Speech and behavior are normal.    Radiology     No orders to display     Labs     Labs Reviewed   BASIC METABOLIC PANEL - Abnormal; Notable for the following components:       Result Value    Glucose 247 (*)     All other components within normal limits   APTT - Abnormal; Notable for the following components:    APTT 24.7 (*)     All other components within normal limits   CBC W/ AUTO DIFF - Abnormal; Notable for the following components:    MCH 33.4 (*)     Absolute Monocytes 1.0 (*)     All other components within normal limits   D-DIMER, QUANTITATIVE - Normal    Narrative:     When used in conjunction with a clinical Pre-Test Probability assessment to exclude the venous thromboembolism (VTE) in outpatients suspected of deep vein thrombosis (DVT) and/or pulmonary embolism (PE), a cut-off of <500 ng/mL FEUs is recommended.     On 12-09-2019, [facility] converted D-Dimer testing from DDU (D-dimer Units) to FEUs (Fibrinogen Equivalence Units). Results reported prior to 0800 on 12-09-2019 were reported using DDUs which lowered the reported value in ng/mL by 2-3 fold.       CBC W/ DIFFERENTIAL    Narrative:     The following orders were created for panel order CBC w/ Differential.  Procedure                               Abnormality         Status                     ---------                               -----------         ------                     CBC w/ [id number]]         Abnormal            Final result                 Please view results for these tests on the individual orders.   PROTIME-INR       Pertinent labs & imaging results that were available during my care of the patient were reviewed by me and considered in my medical decision making (see chart for details).    Please note- This chart has been created using [employer]. Chart creation errors have been sought, but may not always be located and such creation errors, especially pronoun confusion, do NOT reflect on  the standard of medical care.    Attestations     Documentation assistance was provided by Lenward Chancellor, Scribe, on January 11, 2021 at 6:23 PM for Konrad Penta, MD.     Documentation assistance was provided by the scribe in my presence.  The documentation recorded by the scribe has been reviewed by me and accurately reflects the services I personally performed. Edits were made as necessary.    Konrad Penta, MD      Konrad Penta, MD  EM PGY-2     Konrad Penta, MD  Resident  01/18/21 [phone]

## 2021-01-23 NOTE — Unmapped (Signed)
Patient was seen in ED. 

## 2021-01-26 NOTE — Unmapped (Signed)
Logan Quinn has a cellulitis on his leg. He reports it's stable but not healing, and he has completed his antibiotics. He reports no systemic symptoms (no fever/chills, no spikes in BG), and no spreading of redness up his leg. I will loop in dermatology to make sure they feel comfortable with him continuing on Humira. I recommended that if his infection continues to worsen in pain or appearance, that he pause on giving more Humira.    Logan Quinn reports that his HS is stable and Humira continues to be helpful for him along with topical clindamycin.     He denied missed doses (pharmacy didn't fill in July), but that he had a large supply that accumulated over time.     [facility] Clinical Assessment & Refill Coordination Note    Logan Quinn, DOB: [birth year]  Phone: [phone] (home)     All above HIPAA information was verified with patient.     Was a Nurse, learning disability used for this call? No    Specialty Medication(s):   Inflammatory Disorders: Humira     Current Outpatient Medications   Medication Sig Dispense Refill   ??? acetaminophen (TYLENOL) 500 MG tablet Take 2 tablets (1,000 mg total) by mouth Three (3) times a day. 30 tablet 0   ??? ADVAIR DISKUS 500-50 mcg/dose diskus Inhale 1 puff 2 (two) times a day. 60 each 11   ??? albuterol 2.5 mg /3 mL (0.083 %) nebulizer solution Inhale 3 mL (2.5 mg total) by nebulization every four (4) hours as needed for wheezing. 180 mL 2   ??? albuterol HFA 90 mcg/actuation inhaler Inhale 2 puffs every six (6) hours as needed for wheezing. 8 g 0   ??? ALPRAZolam (XANAX) 0.5 MG tablet Take 1 tablet (0.5 mg total) by mouth Three (3) times a day. 90 tablet 1   ??? blood sugar diagnostic (ACCU-CHEK GUIDE TEST STRIPS) Strp by Other route Three (3) times a day before meals. 300 strip 1   ??? blood-glucose meter kit Use as directed 1 each 0   ??? clindamycin (CLEOCIN T) 1 % external solution clindamycin phosphate 1 % topical solution   APPLY TOPICALLY TO GROIN AREA TWICE A DAY     ??? clindamycin (CLEOCIN T) 1 % lotion APPLY TOPICALLY AT NIGHT TO INFLAMED AREAS AS NEEDED FOR FLARES     ??? clobetasoL (TEMOVATE) 0.05 % ointment Apply daily to painful affected areas if needed for flares, then stop 15 g 1   ??? cyclobenzaprine (FLEXERIL) 10 MG tablet Take 1 tablet (10 mg total) by mouth Three (3) times a day as needed for muscle spasms. 60 tablet 0   ??? dextroamphetamine-amphetamine (ADDERALL XR) 20 MG 24 hr capsule Take 1 capsule by mouth every morning.     ??? dicyclomine (BENTYL) 10 mg capsule Take 1 capsule (10 mg total) by mouth Three (3) times a day. 270 capsule 1   ??? diphenoxylate-atropine (LOMOTIL) 2.5-0.025 mg per tablet Take 1 tablet by mouth two (2) times a day as needed for diarrhea. 30 tablet 5   ??? dulaglutide 1.5 mg/0.5 mL PnIj Inject 0.5 mL (1.5 mg total) under the skin every seven (7) days. 2 mL 1   ??? DULoxetine (CYMBALTA) 60 MG capsule Take 1 capsule (60 mg total) by mouth Two (2) times a day. 180 capsule 0   ??? empty container (SHARPS CONTAINER) Misc Use as directed to dispose of Humira needles 1 each 2   ??? erenumab-aooe 140 mg/mL AtIn Inject  140 mg under the skin every twenty-eight (28) days. (Patient taking differently: Inject 140 mg under the skin every twenty-eight (28) days. Amovig) 1 mL 6   ??? famotidine (PEPCID) 20 MG tablet Take 1 tablet (20 mg total) by mouth Two (2) times a day. 180 tablet 2   ??? fluticasone propionate (FLONASE) 50 mcg/actuation nasal spray 1 spray into each nostril Two (2) times a day. 16 g 5   ??? glipiZIDE (GLUCOTROL) 10 MG tablet Take 2 tablets (20 mg total) by mouth Two (2) times a day (30 minutes before a meal). 360 tablet 0   ??? HUMALOG U-100 INSULIN 100 unit/mL injection Inject 120 units under the skin daily via Omnipod 40 mL 5   ??? HUMIRA PEN CITRATE FREE 40 MG/0.4 ML Inject the contents of 2 pens (80mg  total) under the skin every seven (7) days. 8 each 3   ??? hydrocortisone (PROCTOSOL HC) 2.5 % rectal cream Insert 1 application into the rectum two (2) times a day as needed. 28.35 g 2   ??? hydrOXYzine (ATARAX) 25 MG tablet Take 1 tablet (25 mg total) by mouth Three (3) times a day as needed. 120 tablet 1   ??? ibuprofen (ADVIL,MOTRIN) 800 MG tablet Take 1 tablet (800 mg total) by mouth every eight (8) hours as needed. 90 tablet 1   ??? insulin pump cartridge Crtg Inject 120 Units under the skin daily. 5 each 5   ??? insulin syringe-needle U-100 1 mL 31 gauge x 5/16 (8 mm) Syrg insulin syringe U-100 with needle 1 mL 31 gauge x 5/16   USE UP TO 5 TIMES A DAY TO ADMINISTER INSULIN     ??? ketoconazole (NIZORAL) 2 % cream Apply 1 application topically daily. To the affected area of the groin 60 g 11   ??? lancets (ACCU-CHEK FASTCLIX LANCET DRUM) Misc Use to check blood sugars 3 times a day before meals or as directed 200 each 5   ??? lancets Misc Type 2 diabetes, poorly controlled 250.02   Test 2 times daily 100 each 11   ??? levothyroxine (SYNTHROID) 50 MCG tablet Take 1 tablet (50 mcg total) by mouth daily. 90 tablet 1   ??? lidocaine (XYLOCAINE) 5 % ointment lidocaine 5 % topical ointment     ??? montelukast (SINGULAIR) 10 mg tablet Take 1 tablet (10 mg total) by mouth daily. 90 tablet 3   ??? ondansetron (ZOFRAN-ODT) 4 MG disintegrating tablet Take 1 tablet (4 mg total) by mouth every eight (8) hours as needed for nausea for up to 15 doses. 15 tablet 1   ??? oxybutynin (DITROPAN) 5 MG tablet Take 1 tablet (5 mg total) by mouth Two (2) times a day. 180 tablet 1   ??? pantoprazole (PROTONIX) 40 MG tablet Take 1 tablet (40 mg total) by mouth Two (2) times a day. 180 tablet 0   ??? polyethylene glycol (GLYCOLAX) 17 gram/dose powder take 17GM (DISSOLVED IN WATER) by mouth once daily     ??? PROAIR HFA 90 mcg/actuation inhaler Inhale 2 puffs every four (4) hours as needed for wheezing. 3 Inhaler 1   ??? promethazine (PHENERGAN) 25 MG tablet Take 1-2 tablets (25-50 mg total) by mouth nightly as needed for nausea. 30 tablet 3   ??? propranoloL (INDERAL LA) 120 mg 24 hr capsule Take 1 capsule (120 mg total) by mouth in the morning. 90 capsule 0   ??? sour cherry extract (TART CHERRY EXTRACT) 1,000 mg cap      ??? topiramate (  TOPAMAX) 50 MG tablet Take 1.5 tablets (75 mg total) by mouth Two (2) times a day. 270 tablet 1   ??? traZODone (DESYREL) 100 MG tablet Take 2 tablets (200 mg total) by mouth nightly. (Patient taking differently: Take 100 mg by mouth nightly.) 180 tablet 0     No current facility-administered medications for this visit.        Changes to medications: Akito reports no changes at this time.    Allergies   Allergen Reactions   ??? Erythromycin Nausea And Vomiting     Other reaction(s): Vomiting   ??? Penicillins Rash, Swelling and Hives   ??? Sulfa (Sulfonamide Antibiotics) Nausea And Vomiting and Nausea Only   ??? Other    ??? Erythromycin Base Rash   ??? Sulfasalazine Nausea And Vomiting       Changes to allergies: No    SPECIALTY MEDICATION ADHERENCE     Humira - 0 left  Medication Adherence    Patient reported X missed doses in the last month: 0  Specialty Medication: Humira          Specialty medication(s) dose(s) confirmed: Regimen is correct and unchanged.     Are there any concerns with adherence? unclear - patient reports no missed doses. unclear why he had a month's supply extra    Adherence counseling provided? deferred    CLINICAL MANAGEMENT AND INTERVENTION      Clinical Benefit Assessment:    Do you feel the medicine is effective or helping your condition? Yes    Clinical Benefit counseling provided? Not needed    Adverse Effects Assessment:    Are you experiencing any side effects? No    Are you experiencing difficulty administering your medicine? No    Quality of Life Assessment:    Quality of Life    Rheumatology  Oncology  Dermatology  1. What impact has your specialty medication had on the symptoms of your skin condition (i.e. itchiness, soreness, stinging)?: Some  2. What impact has your specialty medication had on your comfort level with your skin?: Some  Cystic Fibrosis          Have you discussed this with your provider? Not needed    Acute Infection Status:    Acute infections noted within Epic:  No active infections  Patient reported infection: YES - cellulitis of the leg- pharmacy reported to provider    Therapy Appropriateness:    Is therapy appropriate? Pharmacist will consult provider    DISEASE/MEDICATION-SPECIFIC INFORMATION      For patients on injectable medications: Patient currently has 0 doses left.  Next injection is scheduled for Thurs, 8/25.    PATIENT SPECIFIC NEEDS     - Does the patient have any physical, cognitive, or cultural barriers? No    - Is the patient high risk? No    - Does the patient require a Care Management Plan? No     - Does the patient require physician intervention or other additional services (i.e. nutrition, smoking cessation, social work)? No      SHIPPING     Specialty Medication(s) to be Shipped:   Inflammatory Disorders: Humira    Other medication(s) to be shipped: No additional medications requested for fill at this time     Changes to insurance: No    Delivery Scheduled: Yes, Expected medication delivery date: Tues, 8/23.     Medication will be delivered via Same Day Courier to the confirmed prescription address in [place].  The patient will receive a drug information handout for each medication shipped and additional FDA Medication Guides as required.  Verified that patient has previously received a Conservation officer, historic buildings and a Surveyor, mining.    The patient or caregiver noted above participated in the development of this care plan and knows that they can request review of or adjustments to the care plan at any time.      All of the patient's questions and concerns have been addressed.    Lanney Gins   [facility] Specialty Pharmacist

## 2021-01-30 DIAGNOSIS — L732 Hidradenitis suppurativa: Principal | ICD-10-CM

## 2021-01-30 NOTE — Unmapped (Addendum)
Update 9/9 - patient reports given all clear from vein and vascular, scheduled to ship out same day courier, 9/9 to prescription address. Mas            [facility] Clinical Intervention    Type of intervention: Medication on-hold    Medication involved: Humira    Problem identified: Logan Quinn has non-resolving cellulitis of his leg. I consulted Dr. Elder Cyphers, Dermatologist to see if appropriate to hold Humira. She agreed we should hold until his follow up with PCP on 8/29.    Intervention performed: I called and relayed plan with Logan Quinn, and he agreed to follow plan. We discussed that if PCP is comfortable with leg healing/progress, he could resume next week.    Follow-up needed: na    Approximate time spent: 0-5 minutes    Clinical evidence used to support intervention: Recruitment consultant / provider recommendation    [facility] Specialty Pharmacist

## 2021-01-30 NOTE — Unmapped (Signed)
Logan Quinn 's Humira shipment will be delayed as a result of prior authorization needed and medication on hold until next appt.     [facility] reached out to the patient  and communicated the delay. We will call the patient back to reschedule the delivery upon resolution. We have not confirmed the new delivery date.

## 2021-01-31 MED ORDER — GLIPIZIDE 10 MG TABLET
ORAL_TABLET | Freq: Two times a day (BID) | ORAL | 0 refills | 90 days | Status: CP
Start: 2021-01-31 — End: 2022-02-01

## 2021-01-31 NOTE — Unmapped (Signed)
Patient is requesting the following refill  Requested Prescriptions     Pending Prescriptions Disp Refills   ??? glipiZIDE (GLUCOTROL) 10 MG tablet 360 tablet 0     Sig: Take 2 tablets (20 mg total) by mouth Two (2) times a day (30 minutes before a meal).       Recent Visits  Date Type Provider Dept   01/09/21 Office Visit [name] Rosita Fire, FNP [facility]   10/11/20 Office Visit [name] Rosita Fire, FNP [place]   08/11/20 Office Visit [name] Rosita Fire, FNP [place] [facility]   07/04/20 Office Visit [name] Rosita Fire, FNP [facility] [facility]   02/21/20 Office Visit [name] Rosita Fire, FNP [place] [facility]   Showing recent visits within past 365 days with a meds authorizing provider and meeting all other requirements  Future Appointments  Date Type Provider Dept   02/05/21 Appointment [name] Rosita Fire, FNP [place] [facility]   04/12/21 Appointment [name] Rosita Fire, FNP [facility] [place] [facility]   Showing future appointments within next 365 days with a meds authorizing provider and meeting all other requirements

## 2021-02-05 ENCOUNTER — Ambulatory Visit: Admit: 2021-02-05 | Discharge: 2021-02-06 | Payer: MEDICARE | Attending: Family | Primary: Family

## 2021-02-05 DIAGNOSIS — I890 Lymphedema, not elsewhere classified: Principal | ICD-10-CM

## 2021-02-05 DIAGNOSIS — R030 Elevated blood-pressure reading, without diagnosis of hypertension: Principal | ICD-10-CM

## 2021-02-05 DIAGNOSIS — L03115 Cellulitis of right lower limb: Principal | ICD-10-CM

## 2021-02-05 DIAGNOSIS — I872 Venous insufficiency (chronic) (peripheral): Principal | ICD-10-CM

## 2021-02-05 MED ORDER — DOXYCYCLINE HYCLATE 100 MG CAPSULE
ORAL_CAPSULE | Freq: Two times a day (BID) | ORAL | 0 refills | 10 days | Status: CP
Start: 2021-02-05 — End: ?

## 2021-02-05 MED ORDER — HYDROCHLOROTHIAZIDE 12.5 MG TABLET
ORAL_TABLET | Freq: Every day | ORAL | 1 refills | 90 days | Status: CP
Start: 2021-02-05 — End: 2022-02-05

## 2021-02-05 NOTE — Unmapped (Signed)
Assessment and Plan:     [name] was seen today for edema, fatigue and fever.    Diagnoses and all orders for this visit:    Cellulitis of right lower extremity  -     doxycycline (VIBRAMYCIN) 100 MG capsule; Take 1 capsule (100 mg total) by mouth Two (2) times a day.  -     PVL Venous Duplex Lower Extremity Right; Future    Elevated blood pressure reading in office with diagnosis of hypertension  BP near goal (136/80 in clinic today).   Reviewed low sodium diet and encouraged regular exercise.   Advised to continue to monitor and log at-home BP readings. Contact clinic if readings become elevated.  Add hydrochlorothiazide 12.5 mg for optimal BP control and for edema.    -     hydroCHLOROthiazide (HYDRODIURIL) 12.5 MG tablet; Take 1 tablet (12.5 mg total) by mouth daily.    Lymphedema  -     PVL Venous Duplex Lower Extremity Right; Future    Chronic venous insufficiency of lower extremity  -     PVL Venous Duplex Lower Extremity Right; Future        I personally spent 30 minutes face-to-face and non-face-to-face in the care of this patient, which includes all pre, intra, and post visit time on the date of service.    Return if symptoms worsen or fail to improve.    HPI:      Logan Quinn  is here for   Chief Complaint   Patient presents with   ??? Edema     Leg edema x 1 month.  RT leg is worse with redness and pain.  Recent cellulitis.   ??? Fatigue     Gets tired easily.   ??? Fever     Temp at night normal to 100.7.     40 year old male with a history of diabetes, lymphedema, venous insufficiency who presents today for evaluation of 3 weeks of progressive right lower extremity redness, swelling, and pain  Attended [employer] camp (as a camp [employer]).   Developed significant edema of lower extremities during the camp. On feet a lot during that time.   On Day 2 developed pain of LE, primarily the right.   Elevated legs with little improvement.     ED visit 01/18/21   Treated for cellulitis. Completed antibiotics as prescribed.   Swelling remains. Redness of right LE not improving.   Varicose vein anterior just below mid shin.   D-dimer negative. No PVL performed.       Patient was using lymphedema pump consistently until cellulitis developed. Began using compression stockings after returning from camp.   Has not seen vein/vascular in a year or >    Had trouble with possible C-diff the last time he took oral clindamycin.   Has had IV Clindamycin without adverse effects.       BP measurements (home)   08/19 158/95  08/17 151/88  08/16  152/92  152/85  160/85    08/15  153/80    08/14  158/87       ROS:      Comprehensive 10 point ROS negative unless otherwise stated in the HPI.      [facility] Components:     Medication adherence and barriers to the treatment plan have been addressed. Opportunities to optimize healthy behaviors have been discussed. Patient / caregiver voiced understanding.    Past Medical/Surgical History:     Past Medical History:  Diagnosis Date   ??? Acne    ??? Acquired hypothyroidism 01/09/2021   ??? Allergic    ??? Anxiety    ??? Depression    ??? Diabetes mellitus (CMS-HCC) Dx 2013    Type II   ??? GERD (gastroesophageal reflux disease)    ??? Gout    ??? Hypertension    ??? IBS (irritable bowel syndrome)    ??? 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 Liver disease    ??? Migraines    ??? Morbid obesity with BMI of 60.0-69.9, adult (CMS-HCC)    ??? 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 Obstructive sleep apnea    ??? OSA on CPAP    ??? Severe obstructive sleep apnea    ??? Trapezius muscle strain 12/07/2013   ??? Urinary incontinence, nocturnal enuresis    ??? Venous insufficiency      Past Surgical History:   Procedure Laterality Date   ??? EYE SURGERY  11/15   ??? PR COLONOSCOPY FLX DX W/COLLJ SPEC WHEN PFRMD N/A 03/24/2013    Procedure: COLONOSCOPY, FLEXIBLE, PROXIMAL TO SPLENIC FLEXURE; DIAGNOSTIC, W/WO COLLECTION SPECIMEN BY BRUSH OR WASH;  Surgeon: Clint Bolder, MD;  [facility];  Service: Gastroenterology   ??? PR EYE SURG POST SGMT PROC UNLISTED Left     pneumatic retinopexy OS   ??? PR UPPER GI ENDOSCOPY,DIAGNOSIS N/A 02/02/2013    Procedure: UGI ENDO, INCLUDE ESOPHAGUS, STOMACH, & DUODENUM &/OR JEJUNUM; DX W/WO COLLECTION SPECIMN, BY BRUSH OR WASH;  Surgeon: Malcolm Metro, MD;  Location: [facility];  Service: Gastroenterology   ??? [place] PYLORIC STENOSIS ([place] HISTORICAL RESULT)         Family History:     Family History   Problem Relation Age of Onset   ??? Cancer Maternal Grandfather    ??? Hearing loss Maternal Grandfather    ??? Cancer Paternal Grandfather    ??? COPD Paternal Grandmother    ??? Arthritis Paternal Grandmother    ??? Depression Paternal Grandmother    ??? Diabetes Mother    ??? Heart disease Mother    ??? Migraines Mother    ??? Arthritis Mother    ??? Depression Mother    ??? GER disease Mother    ??? Hypertension Mother    ??? Angina Mother    ??? COPD Mother    ??? Glaucoma Mother    ??? Hearing loss Mother    ??? Diabetes Sister    ??? Migraines Sister    ??? Asthma Sister    ??? Depression Sister    ??? Hearing loss Sister    ??? Diabetes Brother    ??? Asthma Brother    ??? Diabetes Maternal Grandmother    ??? Heart disease Maternal Grandmother    ??? Migraines Maternal Grandmother    ??? Depression Maternal Grandmother    ??? Angina Maternal Grandmother    ??? Hypertension Maternal Grandmother    ??? Stroke Maternal Grandmother    ??? Diabetes Maternal Uncle    ??? Hearing loss Maternal Uncle    ??? Diabetes Maternal Uncle         resulted in need for kidney transplant   ??? Liver disease Maternal Uncle    ??? Kidney disease Maternal Uncle         needed kidney transplant   ??? Asthma Brother    ??? No Known Problems Father    ??? No Known Problems Paternal Aunt    ???  No Known Problems Paternal Uncle    ??? No Known Problems Other    ??? Colorectal Cancer Neg Hx    ??? Esophageal cancer Neg Hx    ??? Liver cancer Neg Hx    ??? Pancreatic cancer Neg Hx    ??? Stomach cancer Neg Hx    ??? Amblyopia Neg Hx    ??? Blindness Neg Hx    ??? Retinal detachment Neg Hx    ??? Strabismus Neg Hx    ??? Macular degeneration Neg Hx    ??? Anesthesia problems Neg Hx    ??? Broken bones Neg Hx    ??? Clotting disorder Neg Hx    ??? Collagen disease Neg Hx    ??? Dislocations Neg Hx    ??? Fibromyalgia Neg Hx    ??? Gout Neg Hx    ??? Hemophilia Neg Hx    ??? Osteoporosis Neg Hx    ??? Rheumatologic disease Neg Hx    ??? Scoliosis Neg Hx    ??? Severe sprains Neg Hx    ??? Sickle cell anemia Neg Hx    ??? Spinal Compression Fracture Neg Hx    ??? Melanoma Neg Hx    ??? Basal cell carcinoma Neg Hx    ??? Squamous cell carcinoma Neg Hx    ??? Deep vein thrombosis Neg Hx    ??? Cataracts Neg Hx    ??? Thyroid disease Neg Hx        Social History:     Social History     Tobacco Use   ??? Smoking status: Never Smoker   ??? Smokeless tobacco: Never Used   Vaping Use   ??? Vaping Use: Never used   Substance Use Topics   ??? Alcohol use: Not Currently     Comment: rare social   ??? Drug use: Never       Allergies:     Erythromycin, Penicillins, Sulfa (sulfonamide antibiotics), Other, Erythromycin base, and Sulfasalazine    Current Medications:     Current Outpatient Medications   Medication Sig Dispense Refill   ??? acetaminophen (TYLENOL) 500 MG tablet Take 2 tablets (1,000 mg total) by mouth Three (3) times a day. (Patient taking differently: Take 500 mg by mouth Three (3) times a day.) 30 tablet 0   ??? ADVAIR DISKUS 500-50 mcg/dose diskus Inhale 1 puff 2 (two) times a day. 60 each 11   ??? albuterol 2.5 mg /3 mL (0.083 %) nebulizer solution Inhale 3 mL (2.5 mg total) by nebulization every four (4) hours as needed for wheezing. 180 mL 2   ??? albuterol HFA 90 mcg/actuation inhaler Inhale 2 puffs every six (6) hours as needed for wheezing. 8 g 0   ??? ALPRAZolam (XANAX) 0.5 MG tablet Take 1 tablet (0.5 mg total) by mouth Three (3) times a day. 90 tablet 1   ??? blood sugar diagnostic (ACCU-CHEK GUIDE TEST STRIPS) Strp by Other route Three (3) times a day before meals. 300 strip 1   ??? blood-glucose meter kit Use as directed 1 each 0   ??? cetirizine (ZYRTEC) 10 MG tablet Take 1 tablet (10 mg total) by mouth nightly as needed for allergies. 90 tablet 1   ??? clindamycin (CLEOCIN T) 1 % external solution clindamycin phosphate 1 % topical solution   APPLY TOPICALLY TO GROIN AREA TWICE A DAY     ??? clindamycin (CLEOCIN T) 1 % lotion APPLY TOPICALLY AT NIGHT TO INFLAMED AREAS AS NEEDED FOR FLARES     ???  clobetasoL (TEMOVATE) 0.05 % ointment Apply daily to painful affected areas if needed for flares, then stop 15 g 1   ??? cyclobenzaprine (FLEXERIL) 10 MG tablet Take 1 tablet (10 mg total) by mouth Three (3) times a day as needed for muscle spasms. 60 tablet 0   ??? dextroamphetamine-amphetamine (ADDERALL XR) 20 MG 24 hr capsule Take 1 capsule by mouth every morning.     ??? dicyclomine (BENTYL) 10 mg capsule Take 1 capsule (10 mg total) by mouth Three (3) times a day. 270 capsule 1   ??? diphenoxylate-atropine (LOMOTIL) 2.5-0.025 mg per tablet Take 1 tablet by mouth two (2) times a day as needed for diarrhea. 30 tablet 5   ??? dulaglutide 1.5 mg/0.5 mL PnIj Inject 0.5 mL (1.5 mg total) under the skin every seven (7) days. (Patient taking differently: Inject 1.5 mg under the skin every seven (7) days. Trulicity) 2 mL 1   ??? DULoxetine (CYMBALTA) 60 MG capsule Take 1 capsule (60 mg total) by mouth Two (2) times a day. 180 capsule 0   ??? empty container (SHARPS CONTAINER) Misc Use as directed to dispose of Humira needles 1 each 2   ??? erenumab-aooe 140 mg/mL AtIn Inject 140 mg under the skin every twenty-eight (28) days. (Patient taking differently: Inject 140 mg under the skin every twenty-eight (28) days. Amovig) 1 mL 6   ??? famotidine (PEPCID) 20 MG tablet Take 1 tablet (20 mg total) by mouth Two (2) times a day. 180 tablet 2   ??? fluoride, sodium, 1.1 % Pste      ??? fluticasone propionate (FLONASE) 50 mcg/actuation nasal spray 1 spray into each nostril Two (2) times a day. 16 g 5   ??? glipiZIDE (GLUCOTROL) 10 MG tablet Take 2 tablets (20 mg total) by mouth Two (2) times a day (30 minutes before a meal). 360 tablet 0   ??? HUMALOG U-100 INSULIN 100 unit/mL injection Inject 120 units under the skin daily via Omnipod 40 mL 5   ??? HUMIRA PEN CITRATE FREE 40 MG/0.4 ML Inject the contents of 2 pens (80mg  total) under the skin every seven (7) days. 8 each 3   ??? HYDROcodone-acetaminophen (NORCO) 5-325 mg per tablet Take 1 tablet by mouth every four (4) hours as needed for pain for up to 5 days. 10 tablet 0   ??? hydrocortisone (PROCTOSOL HC) 2.5 % rectal cream Insert 1 application into the rectum two (2) times a day as needed. 28.35 g 2   ??? hydrOXYzine (ATARAX) 25 MG tablet Take 1 tablet (25 mg total) by mouth Three (3) times a day as needed. 120 tablet 1   ??? ibuprofen (ADVIL,MOTRIN) 800 MG tablet Take 1 tablet (800 mg total) by mouth every eight (8) hours as needed. 90 tablet 1   ??? insulin pump cartridge Crtg Inject 120 Units under the skin daily. 5 each 5   ??? insulin syringe-needle U-100 1 mL 31 gauge x 5/16 (8 mm) Syrg insulin syringe U-100 with needle 1 mL 31 gauge x 5/16   USE UP TO 5 TIMES A DAY TO ADMINISTER INSULIN     ??? ketoconazole (NIZORAL) 2 % cream Apply 1 application topically daily. To the affected area of the groin 60 g 11   ??? lancets (ACCU-CHEK FASTCLIX LANCET DRUM) Misc Use to check blood sugars 3 times a day before meals or as directed 200 each 5   ??? lancets Misc Type 2 diabetes, poorly controlled 250.02   Test 2 times daily  100 each 11   ??? levothyroxine (SYNTHROID) 50 MCG tablet Take 1 tablet (50 mcg total) by mouth daily. 90 tablet 1   ??? lidocaine (XYLOCAINE) 5 % ointment lidocaine 5 % topical ointment     ??? montelukast (SINGULAIR) 10 mg tablet Take 1 tablet (10 mg total) by mouth daily. 90 tablet 3   ??? nystatin (MYCOSTATIN) 100,000 unit/gram powder Apply to affected area 3 times daily 30 g 1   ??? ondansetron (ZOFRAN-ODT) 4 MG disintegrating tablet Take 1 tablet (4 mg total) by mouth every eight (8) hours as needed for nausea for up to 15 doses. 15 tablet 1   ??? oxybutynin (DITROPAN) 5 MG tablet Take 1 tablet (5 mg total) by mouth Two (2) times a day. 180 tablet 1   ??? pantoprazole (PROTONIX) 40 MG tablet Take 1 tablet (40 mg total) by mouth Two (2) times a day. 180 tablet 0   ??? polyethylene glycol (GLYCOLAX) 17 gram/dose powder take 17GM (DISSOLVED IN WATER) by mouth once daily     ??? PROAIR HFA 90 mcg/actuation inhaler Inhale 2 puffs every four (4) hours as needed for wheezing. 3 Inhaler 1   ??? promethazine (PHENERGAN) 25 MG tablet Take 1-2 tablets (25-50 mg total) by mouth nightly as needed for nausea. 30 tablet 3   ??? propranoloL (INDERAL LA) 120 mg 24 hr capsule Take 1 capsule (120 mg total) by mouth in the morning. 90 capsule 0   ??? sour cherry extract (TART CHERRY EXTRACT) 1,000 mg cap      ??? topiramate (TOPAMAX) 50 MG tablet Take 1.5 tablets (75 mg total) by mouth Two (2) times a day. 270 tablet 1   ??? traZODone (DESYREL) 100 MG tablet Take 2 tablets (200 mg total) by mouth nightly. (Patient taking differently: Take 100 mg by mouth nightly.) 180 tablet 0     No current facility-administered medications for this visit.       Health Maintenance:     Health Maintenance   Topic Date Due   ??? Pneumococcal Vaccine 0-64 (2 - PCV) 12/30/2013   ??? COVID-19 Vaccine (3 - Pfizer risk series) 01/24/2021   ??? Influenza Vaccine (1) 02/08/2021   ??? Hemoglobin A1c  04/11/2021   ??? Retinal Eye Exam  09/20/2021   ??? Urine Albumin/Creatinine Ratio  10/11/2021   ??? Foot Exam  01/09/2022   ??? Serum Creatinine Monitoring  01/18/2022   ??? Potassium Monitoring  01/18/2022   ??? DTaP/Tdap/Td Vaccines (4 - Td or Tdap) 07/17/2023   ??? COPD Spirometry  05/25/2025   ??? Hepatitis C Screen  Completed       Immunizations:     Immunization History   Administered Date(s) Administered   ??? COVID-19 VACC,MRNA,(PFIZER)(PF)(IM) 12/04/2020, 12/27/2020   ??? Hepatitis B Vaccine, Unspecified Formulation 12/27/1999, 04/29/2000   ??? Hepatitis B, Adult 03/05/2013, 04/05/2013, 09/03/2013   ??? INFLUENZA INJ MDCK PF, QUAD,(FLUCELVAX)(8MO AND UP EGG FREE) 03/31/2019   ??? INFLUENZA TIV (TRI) PF (IM) 10/08/2011   ??? Influenza Vaccine Quad (IIV4 PF) 70mo+ injectable 03/05/2013, 03/18/2014, 03/22/2015, 03/05/2016, 03/19/2017, 02/23/2018   ??? Influenza Virus Vaccine, unspecified formulation 03/19/2017, 02/23/2018, 03/25/2019, 03/10/2020   ??? PNEUMOCOCCAL POLYSACCHARIDE 23 12/30/2012   ??? PPD Test 10/28/2016   ??? TdaP 07/18/2008, 07/16/2013   ??? Tetanus-Diptheria Toxoids-TD(TDVAX),Asdorbed,2LF(IM) 04/29/2000     I have reviewed and (if needed) updated the patient's problem list, medications, allergies, past medical and surgical history, social and family history.     Vital Signs:  Wt Readings from Last 3 Encounters:   02/05/21 (!) 224.1 kg (494 lb)   01/09/21 (!) 224.9 kg (495 lb 12 oz)   10/11/20 (!) 223.2 kg (492 lb)     Temp Readings from Last 3 Encounters:   02/05/21 36.7 ??C (98 ??F) (Oral)   01/17/21 37.1 ??C (98.7 ??F) (Oral)   01/09/21 37.3 ??C (99.1 ??F) (Oral)     BP Readings from Last 3 Encounters:   02/05/21 136/80   01/18/21 150/77   01/09/21 124/76     Pulse Readings from Last 3 Encounters:   02/05/21 90   01/18/21 72   01/09/21 65     Estimated body mass index is 68.9 kg/m?? as calculated from the following:    Height as of this encounter: 180.3 cm (5' 11).    Weight as of this encounter: 224.1 kg (494 lb).  Facility age limit for growth percentiles is 20 years.      Objective:      General: Alert and oriented x3. Well-appearing. No acute distress. Morbidly obese.   HEENT:  Normocephalic.  Atraumatic. Conjunctiva and sclera normal. OP MMM without lesions.   Neck:  Supple. No thyroid enlargement. No adenopathy.   Heart:  Regular rate and rhythm. Normal S1, S2. No murmurs, rubs or gallops.   Lungs:  No respiratory distress.  Lungs clear to auscultation. No wheezes, rhonchi, or rales.   GI/GU:  Soft, +BS, nondistended, non-TTP. No palpable masses or organomegaly.   Extremities:  Lymphedema bilateral LE. Peripheral pulses normal.   Skin:  Warm, dry. Localized circumferential erythema superior and inferior to bulging varicose vein located at the RLE extending from the ankle to the mid shin. +TTP. No excessive warmth to touch.   Neuro:  Non-focal. No obvious weakness.   Psych:  Affect normal, eye contact good, speech clear and coherent.           [name], FNP

## 2021-02-06 ENCOUNTER — Telehealth (INDEPENDENT_AMBULATORY_CARE_PROVIDER_SITE_OTHER): Payer: Self-pay | Admitting: Vascular Surgery

## 2021-02-06 NOTE — Telephone Encounter (Signed)
He would need a reflux study if his DVT study is negative.  Can be seen by me or GS

## 2021-02-06 NOTE — Telephone Encounter (Signed)
Called stating that he has been experiencing swelling with pain in rle. Patient states that his PCP advised him to come in to be seen. Patient states that PCP ordered rle ven dvt (in the near future). Will patient need additional or different [place] when scheduled to come in to be seen? Patient was last seen 02/2019 for varicose veins with FB. Please advise.

## 2021-02-06 NOTE — Telephone Encounter (Signed)
Called patient to schedule

## 2021-02-07 NOTE — Unmapped (Signed)
Data reviewed

## 2021-02-08 ENCOUNTER — Ambulatory Visit: Admit: 2021-02-08 | Discharge: 2021-02-09 | Payer: MEDICARE

## 2021-02-13 ENCOUNTER — Other Ambulatory Visit (INDEPENDENT_AMBULATORY_CARE_PROVIDER_SITE_OTHER): Payer: Self-pay | Admitting: Nurse Practitioner

## 2021-02-13 DIAGNOSIS — M7989 Other specified soft tissue disorders: Secondary | ICD-10-CM

## 2021-02-14 ENCOUNTER — Ambulatory Visit (INDEPENDENT_AMBULATORY_CARE_PROVIDER_SITE_OTHER): Payer: Medicare Other

## 2021-02-14 ENCOUNTER — Ambulatory Visit (INDEPENDENT_AMBULATORY_CARE_PROVIDER_SITE_OTHER): Payer: Medicare Other | Admitting: Nurse Practitioner

## 2021-02-14 ENCOUNTER — Other Ambulatory Visit: Payer: Self-pay

## 2021-02-14 ENCOUNTER — Encounter (INDEPENDENT_AMBULATORY_CARE_PROVIDER_SITE_OTHER): Payer: Self-pay | Admitting: Nurse Practitioner

## 2021-02-14 VITALS — BP 168/79 | HR 77 | Ht 71.0 in | Wt >= 6400 oz

## 2021-02-14 DIAGNOSIS — L03115 Cellulitis of right lower limb: Secondary | ICD-10-CM | POA: Diagnosis not present

## 2021-02-14 DIAGNOSIS — R600 Localized edema: Secondary | ICD-10-CM

## 2021-02-14 DIAGNOSIS — I890 Lymphedema, not elsewhere classified: Secondary | ICD-10-CM | POA: Diagnosis not present

## 2021-02-14 DIAGNOSIS — I83813 Varicose veins of bilateral lower extremities with pain: Secondary | ICD-10-CM

## 2021-02-14 DIAGNOSIS — M7989 Other specified soft tissue disorders: Secondary | ICD-10-CM

## 2021-02-14 NOTE — Progress Notes (Signed)
Subjective:    Patient ID: Jason Davidson, male    DOB: 23-Apr-1981, 40 y.o.   MRN: [id number] Chief Complaint  Patient presents with   Follow-up    Add on per phone note positive DVT ven reflux not needed  LE ven reflux     Jason MuttonRichard Davidson 40 year old male that presents today after having issues with swelling and redness in his right lower extremity.  He recently presented to his primary care provider who provided with antibiotics.  Today the redness is largely resolved but the swelling has continued.  The swelling began when he went to take his son to a camping trip.  The patient generally wears medical grade compression stockings daily.  However since his leg began to swell it is difficult wearing them due to the pain.  He also is diligent with elevating his lower extremities as well as the use of his lymphedema pump nightly.  The patient notes no drastic differences in his routine for conservative therapy.  He denies any open wounds or ulcerations.  He denies any weeping.  He does note that several superficial varicosities are very painful to the touch.  The patient notes that he has had an endovenous ablation on his right lower extremity.  The tenderness is very painful and it causes difficulties with his daily activities.  He denies any fevers or chills.  Today noninvasive studies no evidence of DVT.  Note reflux in the right great saphenous vein at the distal thigh displays reflux.   Review of Systems  Cardiovascular:  Positive for leg swelling.  Skin:  Negative for wound.  All other systems reviewed and are negative.     Objective:   Physical Exam Vitals reviewed.  HENT:     Head: Normocephalic.  Cardiovascular:     Rate and Rhythm: Normal rate.     Pulses:          Dorsalis pedis pulses are 1+ on the right side.  Pulmonary:     Effort: Pulmonary effort is normal.  Musculoskeletal:     Right lower leg: 2+ Edema present.  Neurological:     Mental Status: He is alert  and oriented to person, place, and time.  Psychiatric:        Mood and Affect: Mood normal.        Behavior: Behavior normal.        Thought Content: Thought content normal.        Judgment: Judgment normal.    BP (!) 168/79   Pulse 77   Ht 5\' 11"  (1.803 m)   Wt (!) 495 lb (224.5 kg)   BMI 69.04 kg/m   Past Medical History:  Diagnosis Date   Asthma    Depressed    Diabetes mellitus without complication (HCC)    IBS (irritable bowel syndrome)    Morbid obesity (HCC)     Social History   Socioeconomic History   Marital status: Married    Spouse name: Not on file   Number of children: Not on file   Years of education: Not on file   Highest education level: Not on file  Occupational History   Not on file  Tobacco Use   Smoking status: Never   Smokeless tobacco: Never  Vaping Use   Vaping Use: Never used  Substance and Sexual Activity   Alcohol use: No   Drug use: No   Sexual activity: Not on file  Other Topics Concern   Not on file  Social History Narrative   Not on file   Social Determinants of Health   Financial Resource Strain: Not on file  Food Insecurity: Not on file  Transportation Needs: Not on file  Physical Activity: Not on file  Stress: Not on file  Social Connections: Not on file  Intimate Partner Violence: Not on file    Past Surgical History:  Procedure Laterality Date   EYE SURGERY      Family History  Problem Relation Age of Onset   Diabetes Mother    Depression Mother    Diabetes Sister    Diabetes Brother    Diabetes Maternal Uncle    Diabetes Maternal Grandmother    Heart disease Maternal Grandmother    Cancer Maternal Grandfather    COPD Paternal Grandmother    Cancer Paternal Grandfather    Varicose Veins Paternal Grandfather    Healthy Father     Allergies  Allergen Reactions   Erythromycin Nausea And Vomiting   Penicillins Swelling   Sulfa Antibiotics Nausea And Vomiting    CBC Latest Ref Rng & Units 02/14/2014  12/24/2013 11/30/2013  WBC 3.8 - 10.6 x10 3/mm 3 8.0 13.0(H) 11.0(H)  Hemoglobin 13.0 - 18.0 g/dL 70.0 17.4 94.4  Hematocrit 40.0 - 52.0 % 43.4 46.6 42.0  Platelets 150 - 440 x10 3/mm 3 175 184 174      CMP     Component Value Date/Time   NA 137 02/14/2014 0437   K 3.8 02/14/2014 0437   CL 101 02/14/2014 0437   CO2 26 02/14/2014 0437   GLUCOSE 307 (H) 02/14/2014 0437   BUN 12 02/14/2014 0437   CREATININE 1.01 02/14/2014 0437   CALCIUM 8.4 (L) 02/14/2014 0437   PROT 7.4 02/14/2014 0437   ALBUMIN 3.5 02/14/2014 0437   AST 62 (H) 02/14/2014 0437   ALT 76 (H) 02/14/2014 0437   ALKPHOS 91 02/14/2014 0437   BILITOT 0.9 02/14/2014 0437   GFRNONAA >60 02/14/2014 0437   GFRAA >60 02/14/2014 0437     No results found.     Assessment & Plan:   1. Lymphedema I suspect that the patient is having an exacerbation of his lymphedema.  The patient does note wearing his compression socks diligently as well as diligent use of his compression socks and other conservative activities including elevation.  We will place the patient in Unna wrap to gain control of his swelling.  This will be changed on a weekly basis we will have the patient return in 4 weeks to evaluate progress.  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  Patient should undergo injection sclerotherapy to treat the residual varicosities of the right lower extremity.  The risks, benefits and alternative therapies were reviewed in detail with the patient.  All questions were answered.  The patient agrees to proceed with sclerotherapy at their convenience.  The patient will continue wearing the graduated compression stockings and using the over-the-counter pain medications to treat her  symptoms.       3. Cellulitis of right lower extremity This appears resolved   Current Outpatient Medications on File Prior to Visit  Medication Sig Dispense Refill   Adalimumab 40 MG/0.4ML PNKT Inject into the skin.     AIMOVIG 70 MG/ML SOAJ   0  albuterol (VENTOLIN HFA) 108 (90 Base) MCG/ACT inhaler Inhale 1-2 puffs into the lungs every 6 (six) hours as needed for wheezing or shortness of breath. 18 g 0   ALPRAZolam (XANAX) 0.5 MG tablet Take 0.5 mg by mouth at bedtime as needed for anxiety.     amphetamine-dextroamphetamine (ADDERALL) 10 MG tablet Take 10 mg by mouth 2 (two) times daily with a meal.     azelastine (ASTELIN) 0.1 % nasal spray Place into the nose.     Colchicine 0.6 MG CAPS Take 1.2 mg on Day 1 of flare, followed by 0.6 mg one hour later. Day 2 take 0.6 mg BID until flare resolves.     cyclobenzaprine (FLEXERIL) 10 MG tablet Take by mouth.     cyclobenzaprine (FLEXERIL) 10 MG tablet Take 1 tablet (10 mg total) by mouth 3 (three) times daily as needed. 15 tablet 0   dicyclomine (BENTYL) 10 MG capsule Take 10 mg by mouth 3 (three) times daily before meals.     diphenoxylate-atropine (LOMOTIL) 2.5-0.025 MG tablet Take by mouth.     doxycycline (VIBRAMYCIN) 100 MG capsule Take 100 mg by mouth 2 (two) times daily.     Dulaglutide 0.75 MG/0.5ML SOPN Inject into the skin.     DULoxetine (CYMBALTA) 60 MG capsule Take 120 mg by mouth daily.      fluticasone (FLONASE) 50 MCG/ACT nasal spray Place 1 spray into both nostrils 2 (two) times daily.     Fluticasone-Salmeterol (ADVAIR) 500-50 MCG/DOSE AEPB Inhale 1 puff into the lungs 2 (two) times daily.     gabapentin (NEURONTIN) 100 MG capsule Take 100 mg by mouth 3 (three) times daily.     gabapentin (NEURONTIN) 300 MG capsule Take 300 mg by mouth 3 (three) times daily.     glipiZIDE (GLUCOTROL) 10 MG tablet Take 10 mg by mouth daily before breakfast.     glucose blood test strip Use 1 strip 3 times a day with meter     HUMALOG  100 UNIT/ML injection SMARTSIG:120 Unit(s) SUB-Q Daily     hydrochlorothiazide (HYDRODIURIL) 12.5 MG tablet Take 12.5 mg by mouth daily.     hydrocortisone (ANUSOL-HC) 2.5 % rectal cream Place rectally.     hydrocortisone (ANUSOL-HC) 25 MG suppository Place rectally.     hydrOXYzine (ATARAX/VISTARIL) 25 MG tablet Take 25 mg by mouth 4 (four) times daily as needed.     insulin regular (NOVOLIN R,HUMULIN R) 250 units/2.92mL (100 units/mL) injection Inject into the skin 3 (three) times daily before meals.     Insulin Syringe-Needle U-100 31G X 5/16" 1 ML MISC Use as directed with the Lantus Insulin nightly     ketoconazole (NIZORAL) 2 % cream Apply topically daily.     lidocaine (XYLOCAINE) 2 % solution Use as directed 15 mLs in the mouth or throat every 3 (three) hours as needed for mouth pain (swish and spit). 100 mL 0   loperamide (IMODIUM) 2 MG capsule Take by mouth.     neomycin-polymyxin-hydrocortisone (CORTISPORIN) 3.5-10000-1 OTIC suspension Apply 1-2 drops daily after soaking and cover with bandaid 10 mL 0   ondansetron (ZOFRAN-ODT) 4 MG disintegrating tablet Take 4 mg by mouth every 8 (eight) hours as needed.     oxybutynin (DITROPAN) 5 MG tablet Take 5 mg by mouth 2 (two) times daily.     pantoprazole (PROTONIX) 40 MG tablet Take 40 mg by mouth daily.     polyethylene glycol powder (GLYCOLAX/MIRALAX) powder take 17GM (DISSOLVED IN WATER) by mouth once  daily  0   PROAIR HFA 108 (90 Base) MCG/ACT inhaler   0   promethazine (PHENERGAN) 25 MG tablet Take 25-50 mg by mouth at bedtime as needed.     promethazine-dextromethorphan (PROMETHAZINE-DM) 6.25-15 MG/5ML syrup Take 5 mLs by mouth 4 (four) times daily as needed. (Patient taking differently: Take 5 mLs by mouth 4 (four) times daily as needed.) 118 mL 0   propranolol (INNOPRAN XL) 120 MG 24 hr capsule Take 120 mg by mouth at bedtime.     sodium fluoride (FLUORISHIELD) 1.1 % GEL dental gel      topiramate (TOPAMAX) 50 MG tablet Take 50 mg by  mouth 2 (two) times daily.     traZODone (DESYREL) 100 MG tablet Take by mouth.     famotidine (PEPCID) 20 MG tablet Take by mouth.     insulin glargine (LANTUS) 100 UNIT/ML injection Inject into the skin.     levothyroxine (SYNTHROID) 50 MCG tablet Take by mouth.     montelukast (SINGULAIR) 10 MG tablet Take by mouth.     No current facility-administered medications on file prior to visit.    There are no Patient Instructions on file for this visit. No follow-ups on file.   Georgiana Spinner, NP

## 2021-02-14 NOTE — Unmapped (Signed)
Has the cellulitis resolved? If so, can restart Humira. If cellulitis still present, will continue to hold Humira. Have you reached out to dermatology to see what they have to say?   Toris Laverdiere

## 2021-02-18 ENCOUNTER — Encounter (INDEPENDENT_AMBULATORY_CARE_PROVIDER_SITE_OTHER): Payer: Self-pay

## 2021-02-19 NOTE — Unmapped (Signed)
Patient has been seen since this original message.   Logan Quinn

## 2021-02-21 NOTE — Unmapped (Signed)
If cellulitis still present (redness and symptoms not fully cleared) I would continue to hold Humira.   If cellulitis is completely resolved, I would recommend resuming Humira.

## 2021-02-22 ENCOUNTER — Encounter (INDEPENDENT_AMBULATORY_CARE_PROVIDER_SITE_OTHER): Payer: Self-pay

## 2021-02-22 ENCOUNTER — Other Ambulatory Visit: Payer: Self-pay

## 2021-02-22 ENCOUNTER — Ambulatory Visit (INDEPENDENT_AMBULATORY_CARE_PROVIDER_SITE_OTHER): Payer: Medicare Other | Admitting: Nurse Practitioner

## 2021-02-22 VITALS — BP 177/82 | HR 72 | Resp 16 | Wt >= 6400 oz

## 2021-02-22 DIAGNOSIS — I890 Lymphedema, not elsewhere classified: Secondary | ICD-10-CM

## 2021-02-22 NOTE — Progress Notes (Signed)
History of Present Illness  There is no documented history at this time  Assessments & Plan   There are no diagnoses linked to this encounter.    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    Procedure:  3 layer unna wrap was placed Right lower extremity.   Plan:   Follow up in one week.   

## 2021-03-01 ENCOUNTER — Other Ambulatory Visit: Payer: Self-pay

## 2021-03-01 ENCOUNTER — Ambulatory Visit (INDEPENDENT_AMBULATORY_CARE_PROVIDER_SITE_OTHER): Payer: Medicare Other | Admitting: Nurse Practitioner

## 2021-03-01 VITALS — BP 141/79 | HR 68 | Ht 71.0 in | Wt >= 6400 oz

## 2021-03-01 DIAGNOSIS — I890 Lymphedema, not elsewhere classified: Secondary | ICD-10-CM

## 2021-03-01 NOTE — Progress Notes (Signed)
History of Present Illness  There is no documented history at this time  Assessments & Plan   There are no diagnoses linked to this encounter.    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    Procedure:  3 layer unna wrap was placed Right lower extremity.   Plan:   Follow up in one week.   

## 2021-03-02 DIAGNOSIS — E1165 Type 2 diabetes mellitus with hyperglycemia: Principal | ICD-10-CM

## 2021-03-02 DIAGNOSIS — Z794 Long term (current) use of insulin: Principal | ICD-10-CM

## 2021-03-02 MED ORDER — DULAGLUTIDE 1.5 MG/0.5 ML SUBCUTANEOUS PEN INJECTOR
SUBCUTANEOUS | 1 refills | 28 days
Start: 2021-03-02 — End: ?

## 2021-03-02 NOTE — Unmapped (Signed)
[facility] Refill Coordination Note    Specialty Medication(s) to be Shipped:   Inflammatory Disorders: Humira    Other medication(s) to be shipped: No additional medications requested for fill at this time     Logan Quinn, DOB: [birth year]  Phone: [phone] (home)       All above HIPAA information was verified with patient.     Was a Nurse, learning disability used for this call? No    Completed refill call assessment today to schedule patient's medication shipment from the [facility] [phone]).  All relevant notes have been reviewed.     Specialty medication(s) and dose(s) confirmed: Regimen is correct and unchanged.   Changes to medications: Ezana reports no changes at this time.  Changes to insurance: No  New side effects reported not previously addressed with a pharmacist or physician: None reported  Questions for the pharmacist: No    Confirmed patient received a Conservation officer, historic buildings and a Surveyor, mining with first shipment. The patient will receive a drug information handout for each medication shipped and additional FDA Medication Guides as required.       DISEASE/MEDICATION-SPECIFIC INFORMATION        For patients on injectable medications: Patient currently has 1 doses left.  Next injection is scheduled for 03/08/2021.    SPECIALTY MEDICATION ADHERENCE     Medication Adherence    Patient reported X missed doses in the last month: 0  Specialty Medication: Humira CF 40 mg/0.4 ml  Patient is on additional specialty medications: No  Any gaps in refill history greater than 2 weeks in the last 3 months: no  Demonstrates understanding of importance of adherence: yes  Informant: patient  Reliability of informant: reliable  Confirmed plan for next specialty medication refill: delivery by pharmacy  Refills needed for supportive medications: not needed              Were doses missed due to medication being on hold? No        REFERRAL TO PHARMACIST     Referral to the pharmacist: Not needed      [facility]     Shipping address confirmed in Epic.     Delivery Scheduled: Yes, Expected medication delivery date: 03/09/2021.     Medication will be delivered via Same Day Courier to the prescription address in Epic WAM.    Angeliz Settlemyre D Keyonta Barradas   [facility] Specialty Technician

## 2021-03-02 NOTE — Unmapped (Signed)
Data reviewed

## 2021-03-05 ENCOUNTER — Telehealth (INDEPENDENT_AMBULATORY_CARE_PROVIDER_SITE_OTHER): Payer: Self-pay | Admitting: Vascular Surgery

## 2021-03-05 ENCOUNTER — Encounter (INDEPENDENT_AMBULATORY_CARE_PROVIDER_SITE_OTHER): Payer: Self-pay | Admitting: Nurse Practitioner

## 2021-03-05 MED ORDER — DULAGLUTIDE 1.5 MG/0.5 ML SUBCUTANEOUS PEN INJECTOR
SUBCUTANEOUS | 1 refills | 28 days | Status: CP
Start: 2021-03-05 — End: 2022-03-05

## 2021-03-05 NOTE — Telephone Encounter (Signed)
Called stating that he had to take his unna boot off because his toes were turning purple from lack of circulation. Patient states that unna boot was too tight,  Patient would like to know if he needs to come in today to be rewrapped or if he should wait until his appointment with [place] on Thursday. Please advise.

## 2021-03-05 NOTE — Telephone Encounter (Signed)
Patient can be schedule for unna wrap

## 2021-03-05 NOTE — Telephone Encounter (Signed)
Called and scheduled patient

## 2021-03-05 NOTE — Unmapped (Signed)
Patient is requesting the following refill  Requested Prescriptions     Pending Prescriptions Disp Refills   ??? dulaglutide 1.5 mg/0.5 mL PnIj 2 mL 1     Sig: Inject 0.5 mL (1.5 mg total) under the skin every seven (7) days.       Recent Visits  Date Type Provider Dept   02/05/21 Office Visit [name] Rosita Fire, FNP [facility]   01/09/21 Office Visit [name] Rosita Fire, FNP [facility]   10/11/20 Office Visit [name] Rosita Fire, FNP [facility]   08/11/20 Office Visit [name] Rosita Fire, FNP [facility]   07/04/20 Office Visit [name] Rosita Fire, FNP [facility]   Showing recent visits within past 365 days with a meds authorizing provider and meeting all other requirements  Future Appointments  Date Type Provider Dept   04/12/21 Appointment [name] Rosita Fire, FNP [facility]   Showing future appointments within next 365 days with a meds authorizing provider and meeting all other requirements       Labs:   A1c:   Hemoglobin A1C (%)   Date Value   01/09/2021 8.5 (A)   09/15/2014 6.0

## 2021-03-06 ENCOUNTER — Other Ambulatory Visit: Payer: Self-pay

## 2021-03-06 ENCOUNTER — Ambulatory Visit (INDEPENDENT_AMBULATORY_CARE_PROVIDER_SITE_OTHER): Payer: Medicare Other | Admitting: Nurse Practitioner

## 2021-03-06 VITALS — BP 168/77 | HR 77 | Ht 71.0 in | Wt >= 6400 oz

## 2021-03-06 DIAGNOSIS — I890 Lymphedema, not elsewhere classified: Secondary | ICD-10-CM

## 2021-03-06 NOTE — Progress Notes (Signed)
History of Present Illness  There is no documented history at this time  Assessments & Plan   There are no diagnoses linked to this encounter.    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    Procedure:  3 layer unna wrap was placed Right lower extremity.   Plan:   Follow up in one week.   

## 2021-03-08 ENCOUNTER — Encounter (INDEPENDENT_AMBULATORY_CARE_PROVIDER_SITE_OTHER): Payer: Medicare Other

## 2021-03-09 ENCOUNTER — Encounter (INDEPENDENT_AMBULATORY_CARE_PROVIDER_SITE_OTHER): Payer: Self-pay | Admitting: Nurse Practitioner

## 2021-03-09 MED FILL — HUMIRA PEN CITRATE FREE 40 MG/0.4 ML: 28 days supply | Qty: 8 | Fill #1

## 2021-03-09 NOTE — Unmapped (Signed)
Inetta Fermo with [facility] left message [phone] if we got RX.

## 2021-03-09 NOTE — Unmapped (Signed)
Spoke to [employer].  We faxed RX order for 1 year on CGM and receiver on 01/23/2021.  They have charts notes and order.  She will reach out to team to see why we keep getting monthly orders.

## 2021-03-15 ENCOUNTER — Ambulatory Visit (INDEPENDENT_AMBULATORY_CARE_PROVIDER_SITE_OTHER): Payer: Medicare Other | Admitting: Nurse Practitioner

## 2021-03-15 ENCOUNTER — Encounter (INDEPENDENT_AMBULATORY_CARE_PROVIDER_SITE_OTHER): Payer: Self-pay | Admitting: Nurse Practitioner

## 2021-03-15 ENCOUNTER — Ambulatory Visit (INDEPENDENT_AMBULATORY_CARE_PROVIDER_SITE_OTHER): Payer: Medicare Other | Admitting: Podiatry

## 2021-03-15 ENCOUNTER — Encounter: Payer: Self-pay | Admitting: Podiatry

## 2021-03-15 ENCOUNTER — Other Ambulatory Visit: Payer: Self-pay

## 2021-03-15 VITALS — BP 199/79 | HR 93 | Resp 18 | Wt >= 6400 oz

## 2021-03-15 DIAGNOSIS — B351 Tinea unguium: Secondary | ICD-10-CM | POA: Diagnosis not present

## 2021-03-15 DIAGNOSIS — M79676 Pain in unspecified toe(s): Secondary | ICD-10-CM

## 2021-03-15 DIAGNOSIS — E1142 Type 2 diabetes mellitus with diabetic polyneuropathy: Secondary | ICD-10-CM | POA: Diagnosis not present

## 2021-03-15 DIAGNOSIS — I83813 Varicose veins of bilateral lower extremities with pain: Secondary | ICD-10-CM

## 2021-03-15 DIAGNOSIS — I872 Venous insufficiency (chronic) (peripheral): Secondary | ICD-10-CM | POA: Diagnosis not present

## 2021-03-15 DIAGNOSIS — I890 Lymphedema, not elsewhere classified: Secondary | ICD-10-CM

## 2021-03-15 DIAGNOSIS — Z794 Long term (current) use of insulin: Secondary | ICD-10-CM

## 2021-03-15 NOTE — Progress Notes (Signed)
This patient returns to my office for at risk foot care.  This patient requires this care by a professional since this patient will be at risk due to having diabetes with no complications.  This patient is unable to cut nails himself since the patient cannot reach his nails.These nails are painful walking and wearing shoes.  This patient presents for at risk foot care today.  General Appearance  Alert, conversant and in no acute stress.  Vascular  Dorsalis pedis and posterior tibial  pulses are palpable  bilaterally.  Capillary return is within normal limits  bilaterally. Temperature is within normal limits  bilaterally.  Neurologic  Senn-Weinstein monofilament wire test within normal limits  bilaterally. Muscle power within normal limits bilaterally.  Nails Thick disfigured discolored nails with subungual debris  from hallux to fifth toes bilaterally. No evidence of bacterial infection or drainage bilaterally.  Orthopedic  No limitations of motion  feet .  No crepitus or effusions noted.  No bony pathology or digital deformities noted.  Skin  normotropic skin with no porokeratosis noted bilaterally.  No signs of infections or ulcers noted.     Onychomycosis  Pain in right toes  Pain in left toes  Consent was obtained for treatment procedures.   Mechanical debridement of nails 1-5  bilaterally performed with a nail nipper.  Filed with dremel without incident.    Return office visit   4 months                   Told patient to return for periodic foot care and evaluation due to potential at risk complications.   Shuntell Foody DPM  

## 2021-03-25 ENCOUNTER — Encounter (INDEPENDENT_AMBULATORY_CARE_PROVIDER_SITE_OTHER): Payer: Self-pay | Admitting: Nurse Practitioner

## 2021-03-25 NOTE — Progress Notes (Signed)
Subjective:    Patient ID: Jason Davidson, male    DOB: 1980/10/26, 40 y.o.   MRN: [id number] Chief Complaint  Patient presents with   Follow-up    Unna wrap check    Jason Davidson 40 year old male that presents today for follow-up after having issues with swelling and redness in his right lower extremity.  He has been in Unna wraps for the last 4 weeks and these have been very helpful with his swelling and discomfort.  He has also been continuing to elevate his lower extremities as well as utilizing his lymphedema pump.  There is still no open wounds or ulcerations.  He continues to have several superficial varicosities that remain painful to the touch despite use of Unna wraps.  The patient has had a previous endovenous ablation of his right lower extremity.  The tenderness is painful it does cause difficulty with his daily activities.    Previous noninvasive studies showed no evidence of DVT.  Note reflux in the right great saphenous vein at the distal thigh displays reflux.   Review of Systems  Cardiovascular:  Positive for leg swelling.  All other systems reviewed and are negative.     Objective:   Physical Exam Vitals reviewed.  HENT:     Head: Normocephalic.  Cardiovascular:     Rate and Rhythm: Normal rate.     Pulses: Normal pulses.  Pulmonary:     Effort: Pulmonary effort is normal.  Musculoskeletal:        General: Tenderness present.  Skin:    General: Skin is warm and dry.     Comments: Several notable varicosities in his right lower extremity  Neurological:     Mental Status: He is alert and oriented to person, place, and time.  Psychiatric:        Mood and Affect: Mood normal.        Behavior: Behavior normal.        Thought Content: Thought content normal.        Judgment: Judgment normal.    BP (!) 199/79 (BP Location: Right Arm)   Pulse 93   Resp 18   Wt (!) 485 lb 9.6 oz (220.3 kg)   BMI 67.73 kg/m   Past Medical History:  Diagnosis  Date   Asthma    Depressed    Diabetes mellitus without complication (HCC)    IBS (irritable bowel syndrome)    Morbid obesity (HCC)     Social History   Socioeconomic History   Marital status: Married    Spouse name: Not on file   Number of children: Not on file   Years of education: Not on file   Highest education level: Not on file  Occupational History   Not on file  Tobacco Use   Smoking status: Never   Smokeless tobacco: Never  Vaping Use   Vaping Use: Never used  Substance and Sexual Activity   Alcohol use: No   Drug use: No   Sexual activity: Not on file  Other Topics Concern   Not on file  Social History Narrative   Not on file   Social Determinants of Health   Financial Resource Strain: Not on file  Food Insecurity: Not on file  Transportation Needs: Not on file  Physical Activity: Not on file  Stress: Not on file  Social Connections: Not on file  Intimate Partner Violence: Not on file    Past Surgical History:  Procedure Laterality Date  EYE SURGERY      Family History  Problem Relation Age of Onset   Diabetes Mother    Depression Mother    Diabetes Sister    Diabetes Brother    Diabetes Maternal Uncle    Diabetes Maternal Grandmother    Heart disease Maternal Grandmother    Cancer Maternal Grandfather    COPD Paternal Grandmother    Cancer Paternal Grandfather    Varicose Veins Paternal Grandfather    Healthy Father     Allergies  Allergen Reactions   Erythromycin Nausea And Vomiting   Penicillins Swelling   Sulfa Antibiotics Nausea And Vomiting    CBC Latest Ref Rng & Units 02/14/2014 12/24/2013 11/30/2013  WBC 3.8 - 10.6 x10 3/mm 3 8.0 13.0(H) 11.0(H)  Hemoglobin 13.0 - 18.0 g/dL 16.1 09.6 04.5  Hematocrit 40.0 - 52.0 % 43.4 46.6 42.0  Platelets 150 - 440 x10 3/mm 3 175 184 174      CMP     Component Value Date/Time   NA 137 02/14/2014 0437   K 3.8 02/14/2014 0437   CL 101 02/14/2014 0437   CO2 26 02/14/2014 0437    GLUCOSE 307 (H) 02/14/2014 0437   BUN 12 02/14/2014 0437   CREATININE 1.01 02/14/2014 0437   CALCIUM 8.4 (L) 02/14/2014 0437   PROT 7.4 02/14/2014 0437   ALBUMIN 3.5 02/14/2014 0437   AST 62 (H) 02/14/2014 0437   ALT 76 (H) 02/14/2014 0437   ALKPHOS 91 02/14/2014 0437   BILITOT 0.9 02/14/2014 0437   GFRNONAA >60 02/14/2014 0437   GFRAA >60 02/14/2014 0437     No results found.     Assessment & Plan:   1. Lymphedema  No surgery or intervention at this point in time.  The wraps have greatly helped the patient's swelling.  I have reviewed my discussion with the patient regarding lymphedema and why it  causes symptoms.  Patient will continue wearing graduated compression stockings class 1 (20-30 mmHg) on a daily basis a prescription was given. The patient is reminded to put the stockings on first thing in the morning and removing them in the evening. The patient is instructed specifically not to sleep in the stockings.   In addition, behavioral modification throughout the day will be continued.  This will include frequent elevation (such as in a recliner), use of over the counter pain medications as needed and exercise such as walking.  I have reviewed systemic causes for chronic edema such as liver, kidney and cardiac etiologies and there does not appear to be any significant changes in these organ systems over the past year.  The patient is under the impression that these organ systems are all stable and unchanged.    The patient will continue aggressive use of the  lymph pump.  This will continue to improve the edema control and prevent sequela such as ulcers and infections.   The patient will follow-up with me on an annual basis.    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  Patient should undergo injection sclerotherapy to treat the residual varicosities of the right lower extremity.  The risks, benefits and alternative therapies were reviewed in detail with the patient.  All questions were answered.  The patient agrees to proceed with  sclerotherapy at their convenience.  The patient will continue wearing the graduated compression stockings and using the over-the-counter pain medications to treat her symptoms.        Current Outpatient Medications on File Prior to Visit  Medication Sig Dispense Refill   Adalimumab 40 MG/0.4ML PNKT Inject into the skin.     AIMOVIG 70 MG/ML SOAJ   0   albuterol (VENTOLIN HFA) 108 (90 Base) MCG/ACT inhaler Inhale 1-2 puffs into the lungs every 6 (six) hours as needed for wheezing or shortness of breath. 18 g 0   ALPRAZolam (XANAX) 0.5 MG tablet Take 0.5 mg by mouth at bedtime as needed for anxiety.     amphetamine-dextroamphetamine (ADDERALL) 10 MG tablet Take 10 mg by mouth 2 (two) times daily with a meal.     azelastine (ASTELIN) 0.1 % nasal spray Place into the nose.     Colchicine 0.6 MG CAPS Take 1.2 mg on Day 1 of flare, followed by 0.6 mg one hour later. Day 2 take 0.6 mg BID until flare resolves.     cyclobenzaprine (FLEXERIL) 10 MG tablet Take 1 tablet (10 mg total) by mouth 3 (three) times daily as needed. 15 tablet 0   dicyclomine (BENTYL) 10 MG capsule Take 10 mg by mouth 3 (three) times daily before meals.     diphenoxylate-atropine (LOMOTIL) 2.5-0.025 MG tablet Take by mouth.     Dulaglutide 0.75 MG/0.5ML SOPN Inject into the skin.     DULoxetine (CYMBALTA) 60 MG capsule Take 120 mg by mouth daily.      fluticasone (FLONASE) 50 MCG/ACT nasal spray Place 1 spray into both nostrils 2 (two) times daily.     Fluticasone-Salmeterol (ADVAIR) 500-50 MCG/DOSE AEPB Inhale 1 puff into the lungs 2 (two) times daily.     glipiZIDE (GLUCOTROL) 10 MG tablet Take 10 mg by mouth daily before  breakfast.     glucose blood test strip Use 1 strip 3 times a day with meter     HUMALOG 100 UNIT/ML injection SMARTSIG:120 Unit(s) SUB-Q Daily     hydrochlorothiazide (HYDRODIURIL) 12.5 MG tablet Take 12.5 mg by mouth daily.     hydrocortisone (ANUSOL-HC) 2.5 % rectal cream Place rectally.     hydrOXYzine (ATARAX/VISTARIL) 25 MG tablet Take 25 mg by mouth 4 (four) times daily as needed.     Insulin Syringe-Needle U-100 31G X 5/16" 1 ML MISC Use as directed with the Lantus Insulin nightly     ketoconazole (NIZORAL) 2 % cream Apply topically daily.     lidocaine (XYLOCAINE) 2 % solution Use as directed 15 mLs in the mouth or throat every 3 (three) hours as needed for mouth pain (swish and spit). 100 mL 0   loperamide (IMODIUM) 2 MG capsule Take by mouth.     ondansetron (ZOFRAN-ODT) 4 MG disintegrating tablet Take 4 mg by mouth every 8 (eight) hours as needed.     oxybutynin (DITROPAN) 5 MG tablet Take 5 mg by mouth 2 (two) times daily.     pantoprazole (PROTONIX) 40 MG tablet Take 40 mg by mouth daily.     polyethylene glycol powder (GLYCOLAX/MIRALAX) powder take 17GM (DISSOLVED IN WATER) by mouth once daily  0   PROAIR HFA 108 (90 Base) MCG/ACT inhaler   0   promethazine (PHENERGAN) 25 MG tablet Take 25-50 mg by mouth at bedtime as needed.     promethazine-dextromethorphan (PROMETHAZINE-DM) 6.25-15 MG/5ML syrup Take 5 mLs by mouth 4 (four) times daily as needed. (Patient taking differently:  Take 5 mLs by mouth 4 (four) times daily as needed.) 118 mL 0   propranolol (INNOPRAN XL) 120 MG 24 hr capsule Take 120 mg by mouth at bedtime.     sodium fluoride (FLUORISHIELD) 1.1 % GEL dental gel      topiramate (TOPAMAX) 50 MG tablet Take 50 mg by mouth 2 (two) times daily.     traZODone (DESYREL) 100 MG tablet Take by mouth.     famotidine (PEPCID) 20 MG tablet Take by mouth.     levothyroxine (SYNTHROID) 50 MCG tablet Take by mouth.     montelukast (SINGULAIR) 10 MG tablet Take by mouth.     No  current facility-administered medications on file prior to visit.    There are no Patient Instructions on file for this visit. No follow-ups on file.   Georgiana Spinner, NP

## 2021-03-26 DIAGNOSIS — G43909 Migraine, unspecified, not intractable, without status migrainosus: Principal | ICD-10-CM

## 2021-03-26 MED ORDER — ERENUMAB-AOOE 140 MG/ML SUBCUTANEOUS AUTO-INJECTOR
SUBCUTANEOUS | 6 refills | 0 days | Status: CP
Start: 2021-03-26 — End: ?

## 2021-03-30 NOTE — Unmapped (Signed)
[facility] Refill Coordination Note    Specialty Medication(s) to be Shipped:   Inflammatory Disorders: Humira    Other medication(s) to be shipped: No additional medications requested for fill at this time     Logan Quinn, DOB: 07-Jun-1981  Phone: [phone] (home)       All above HIPAA information was verified with patient.     Was a Nurse, learning disability used for this call? No    Completed refill call assessment today to schedule patient's medication shipment from the [facility] [phone]).  All relevant notes have been reviewed.     Specialty medication(s) and dose(s) confirmed: Regimen is correct and unchanged.   Changes to medications: Saquan reports no changes at this time.  Changes to insurance: No  New side effects reported not previously addressed with a pharmacist or physician: None reported  Questions for the pharmacist: No    Confirmed patient received a Conservation officer, historic buildings and a Surveyor, mining with first shipment. The patient will receive a drug information handout for each medication shipped and additional FDA Medication Guides as required.       DISEASE/MEDICATION-SPECIFIC INFORMATION        For patients on injectable medications: Patient currently has 1 doses left.  Next injection is scheduled for 04/05/2021.    SPECIALTY MEDICATION ADHERENCE     Medication Adherence    Patient reported X missed doses in the last month: 0  Specialty Medication: Humira CF 40 mg/0.4 ml  Patient is on additional specialty medications: No  Any gaps in refill history greater than 2 weeks in the last 3 months: no  Demonstrates understanding of importance of adherence: yes  Informant: patient  Reliability of informant: reliable  Confirmed plan for next specialty medication refill: delivery by pharmacy  Refills needed for supportive medications: not needed              Were doses missed due to medication being on hold? No        REFERRAL TO PHARMACIST     Referral to the pharmacist: Not needed      [facility]     Shipping address confirmed in Epic.     Delivery Scheduled: Yes, Expected medication delivery date: 04/04/2021.     Medication will be delivered via Same Day Courier to the prescription address in Epic WAM.    Elliot Meldrum D Rhiley Tarver   [facility] Specialty Technician

## 2021-04-04 MED FILL — HUMIRA PEN CITRATE FREE 40 MG/0.4 ML: 28 days supply | Qty: 8 | Fill #2

## 2021-04-04 NOTE — Unmapped (Signed)
I do not recommend Tamiflu unless there is a household positive flu case. There is a gastrointestinal bug going around (Norovirus). Symptoms sound more like GI virus vs influenza.   Logan Quinn

## 2021-04-04 NOTE — Unmapped (Signed)
Covid can also present with diarrhea and abdominal pain.   Logan Quinn

## 2021-04-12 ENCOUNTER — Ambulatory Visit: Admit: 2021-04-12 | Discharge: 2021-04-13 | Payer: MEDICARE | Attending: Family | Primary: Family

## 2021-04-12 DIAGNOSIS — J42 Unspecified chronic bronchitis: Principal | ICD-10-CM

## 2021-04-12 DIAGNOSIS — Z794 Long term (current) use of insulin: Principal | ICD-10-CM

## 2021-04-12 DIAGNOSIS — E1165 Type 2 diabetes mellitus with hyperglycemia: Principal | ICD-10-CM

## 2021-04-12 DIAGNOSIS — I10 Essential (primary) hypertension: Principal | ICD-10-CM

## 2021-04-12 DIAGNOSIS — J988 Other specified respiratory disorders: Principal | ICD-10-CM

## 2021-04-12 MED ORDER — HYDROCHLOROTHIAZIDE 25 MG TABLET
ORAL_TABLET | Freq: Every day | ORAL | 3 refills | 90 days | Status: CP
Start: 2021-04-12 — End: 2022-04-12

## 2021-04-12 MED ORDER — ADVAIR DISKUS 500 MCG-50 MCG/DOSE POWDER FOR INHALATION
Freq: Two times a day (BID) | RESPIRATORY_TRACT | 11 refills | 30.00000 days | Status: CP
Start: 2021-04-12 — End: 2022-04-13

## 2021-04-12 MED ORDER — DOXYCYCLINE HYCLATE 100 MG CAPSULE
ORAL_CAPSULE | Freq: Two times a day (BID) | ORAL | 0 refills | 10.00000 days | Status: CP
Start: 2021-04-12 — End: 2021-04-22

## 2021-04-12 MED ORDER — DULAGLUTIDE 1.5 MG/0.5 ML SUBCUTANEOUS PEN INJECTOR
SUBCUTANEOUS | 2 refills | 28 days | Status: CP
Start: 2021-04-12 — End: 2022-04-12

## 2021-04-12 MED ORDER — ALBUTEROL SULFATE HFA 90 MCG/ACTUATION AEROSOL INHALER
Freq: Four times a day (QID) | RESPIRATORY_TRACT | 5 refills | 0 days | Status: CP | PRN
Start: 2021-04-12 — End: 2022-04-12

## 2021-04-12 NOTE — Unmapped (Signed)
Assessment and Plan:     Logan Quinn was seen today for diabetes and hypothyroidism.    Diagnoses and all orders for this visit:    Type 2 diabetes mellitus with hyperglycemia, with long-term current use of insulin (CMS-HCC)  HGB A1c 7.9 (down from 8.5 three months ago).   DM uncontrolled. Continue glipizide 20 mg BID,??Trulicity 1.5 mg weekly, and use of OmniPod??with Humalog.  Encouraged patient to continue carb controlled diet and regular exercise.??  -     POCT glycosylated hemoglobin (Hb A1C); Future  -     POCT glycosylated hemoglobin (Hb A1C)    Essential hypertension  BP??near goal??(142/82??in clinic today).   Reviewed low sodium diet and encouraged regular exercise.   Advised to continue to monitor and log at-home BP readings. Contact clinic if readings become elevated.  -     hydroCHLOROthiazide (HYDRODIURIL) 25 MG tablet; Take 1 tablet (25 mg total) by mouth daily.    Respiratory infection  Counseled on medication dosage instructions, potential side effects, and return precautions.   -     doxycycline (VIBRAMYCIN) 100 MG capsule; Take 1 capsule (100 mg total) by mouth Two (2) times a day for 10 days.    Chronic bronchitis, unspecified chronic bronchitis type (CMS-HCC)  -     ADVAIR DISKUS 500-50 mcg/dose diskus; Inhale 1 puff  in the morning and 1 puff in the evening.    Other specified respiratory disorders   -     ADVAIR DISKUS 500-50 mcg/dose diskus; Inhale 1 puff  in the morning and 1 puff in the evening.    Other orders  -     albuterol HFA 90 mcg/actuation inhaler; Inhale 2 puffs every six (6) hours as needed for wheezing.        I personally spent 40 minutes face-to-face and non-face-to-face in the care of this patient, which includes all pre, intra, and post visit time on the date of service.    Return in about 3 months (around 07/13/2021) for Next scheduled follow up.    HPI:      Logan Quinn  is here for   Chief Complaint   Patient presents with   ??? Diabetes   ??? Hypothyroidism       Diabetes: Patient presents for follow up of diabetes.  A1C goal is <8.  Diabetes has customarily not been at goal (complicated by: obesity).  Current symptoms include: hyperglycemia. Symptoms have been well-controlled. Patient denies foot ulcerations, hypoglycemia , increase appetite, nausea, paresthesia of the feet, polydipsia, polyuria, visual disturbances, vomitting and weight loss. Evaluation to date has included: hemoglobin A1C.  Home sugars: 110-180. Current treatment: Continued glipizide, Trulicity, and OmniPod with Humalog which has been somewhat effective.  Doing regular exercise: no.    Hypertension: Patient presents for follow-up of hypertension. Blood pressure goal < 140/90.  Hypertension has customarily been at goal complicated by obesity, DM.  Home blood pressure readings: BP log reviewed. BP 180/101 this AM at home  (9:23 AM), 155/86 1:45 AM on 04/12/21, 128/64 on 03/18/21. Patient reports BP generally 140-150's/70-90 range.   . Salt intake and diet: salt not added to cooking and salt shaker not on table. Associated signs and symptoms: dizziness. While driving yesterday, he reports the truck felt like it was spinning and he had to pull over. Patient denies: blurred vision, chest pain, dyspnea, headache, neck aches, orthopnea, palpitations, paroxysmal nocturnal dyspnea, peripheral edema, pulsating in the ears and tiredness/fatigue. Medication compliance: taking as prescribed. Patient took  hydrochlorothiazide at 8 AM this morning. He is not doing regular exercise.    ??  Hypothyroid: Patient presents for follow-up of hypothyroidism. Current symptoms: none . Patient denies change in energy level, diarrhea, heat / cold intolerance, nervousness, palpitations and weight changes. Symptoms have been well-controlled. He is taking medications on a regular basis. Current therapy includes: levothyroxine 50 mcg daily.       Patient has not been taking adderall for a few weeks due to afternoon nausea.   Psych changed adderall to extended release.   Restricts starches,eats primarily chicken breast.   Attempting to exercise more often.   Does not add salt to foods.     Developed head cold in October post camping.   Home covid test negative. Pharmacy covid test negative.   Patient was concerned about the flu.   Residual nasal congestion.   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       Answers for HPI/ROS submitted by the patient on 04/11/2021  Diabetes type: type 2  MedicAlert ID: No  blurred vision: No  chest pain: No  fatigue: No  foot paresthesias: No  foot ulcerations: No  polydipsia: No  polyphagia: No  polyuria: Yes  visual change: No  weakness: No  weight loss: Yes  Symptom course: improving  confusion: No  dizziness: No  headaches: No  hunger: No  mood changes: No  nervous/anxious: No  pallor: No  seizures: No  sleepiness: No  speech difficulty: No  sweats: No  tremors: No  blackouts: No  hospitalization: No  nocturnal hypoglycemia: No  required assistance: No  required glucagon: No  CVA: No  heart disease: No  impotence: No  nephropathy: No  peripheral neuropathy: No  PVD: No  retinopathy: No  CAD risks: family history, hypertension, obesity, sedentary lifestyle  Current treatments: diet, insulin pump, oral agent (monotherapy)  Treatment compliance: most of the time  Dose schedule: pre-breakfast, pre-lunch, pre-dinner, at bedtime  Given by: patient  Injection sites: abdominal wall, arms  Home blood tests: 5+ x per day  Monitoring compliance: good  Blood glucose trend: decreasing steadily  breakfast time: 5-6 am  breakfast glucose level: <70  lunch time: 12-1 pm  lunch glucose level: 110-130  dinner time: after 8 pm  dinner glucose level: 140-180  High score: 140-180  Overall: 140-180  Weight trend: decreasing steadily  Current diet: diabetic, low salt  Meal planning: carbohydrate counting  Dietitian visit: No  Eye exam current: Yes  Sees podiatrist: Yes         ROS:      Comprehensive 10 point ROS negative unless otherwise stated in the HPI.      PCMH Components:     Medication adherence and barriers to the treatment plan have been addressed. Opportunities to optimize healthy behaviors have been discussed. Patient / caregiver voiced understanding.    Past Medical/Surgical History:     Past Medical History:   Diagnosis Date   ??? Acne    ??? Acquired hypothyroidism 01/09/2021   ??? Allergic    ??? Anxiety    ??? Depression    ??? Diabetes mellitus (CMS-HCC) Dx 2013    Type II   ??? GERD (gastroesophageal reflux disease)    ??? Gout    ??? Hypertension    ??? IBS (irritable bowel syndrome)    ??? 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 Liver disease    ??? Migraines    ??? Morbid obesity with BMI  of 60.0-69.9, adult (CMS-HCC)    ??? 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 Obstructive sleep apnea    ??? OSA on CPAP    ??? Severe obstructive sleep apnea    ??? Trapezius muscle strain 12/07/2013   ??? Urinary incontinence, nocturnal enuresis    ??? Venous insufficiency      Past Surgical History:   Procedure Laterality Date   ??? EYE SURGERY  11/15   ??? PR COLONOSCOPY FLX DX W/COLLJ SPEC WHEN PFRMD N/A 03/24/2013    Procedure: COLONOSCOPY, FLEXIBLE, PROXIMAL TO SPLENIC FLEXURE; DIAGNOSTIC, W/WO COLLECTION SPECIMEN BY BRUSH OR WASH;  Surgeon: Clint Bolder, MD;  [facility];  Service: Gastroenterology   ??? PR EYE SURG POST SGMT PROC UNLISTED Left     pneumatic retinopexy OS   ??? PR UPPER GI ENDOSCOPY,DIAGNOSIS N/A 02/02/2013    Procedure: UGI ENDO, INCLUDE ESOPHAGUS, STOMACH, & DUODENUM &/OR JEJUNUM; DX W/WO COLLECTION SPECIMN, BY BRUSH OR WASH;  Surgeon: Malcolm Metro, MD;  Location: [facility];  Service: Gastroenterology   ??? [place] PYLORIC STENOSIS ([place] HISTORICAL RESULT)         Family History:     Family History   Problem Relation Age of Onset   ??? Cancer Maternal Grandfather    ??? Hearing loss Maternal Grandfather    ??? Cancer Paternal Grandfather    ??? COPD Paternal Grandmother    ??? Arthritis Paternal Grandmother    ??? Depression Paternal Grandmother ??? Diabetes Mother    ??? Heart disease Mother    ??? Migraines Mother    ??? Arthritis Mother    ??? Depression Mother    ??? GER disease Mother    ??? Hypertension Mother    ??? Angina Mother    ??? COPD Mother    ??? Glaucoma Mother    ??? Hearing loss Mother    ??? Diabetes Sister    ??? Migraines Sister    ??? Asthma Sister    ??? Depression Sister    ??? Hearing loss Sister    ??? Diabetes Brother    ??? Asthma Brother    ??? Diabetes Maternal Grandmother    ??? Heart disease Maternal Grandmother    ??? Migraines Maternal Grandmother    ??? Depression Maternal Grandmother    ??? Angina Maternal Grandmother    ??? Hypertension Maternal Grandmother    ??? Stroke Maternal Grandmother    ??? Diabetes Maternal Uncle    ??? Hearing loss Maternal Uncle    ??? Diabetes Maternal Uncle         resulted in need for kidney transplant   ??? Liver disease Maternal Uncle    ??? Kidney disease Maternal Uncle         needed kidney transplant   ??? Asthma Brother    ??? No Known Problems Father    ??? No Known Problems Paternal Aunt    ??? No Known Problems Paternal Uncle    ??? No Known Problems Other    ??? Colorectal Cancer Neg Hx    ??? Esophageal cancer Neg Hx    ??? Liver cancer Neg Hx    ??? Pancreatic cancer Neg Hx    ??? Stomach cancer Neg Hx    ??? Amblyopia Neg Hx    ??? Blindness Neg Hx    ??? Retinal detachment Neg Hx    ??? Strabismus Neg Hx    ??? Macular degeneration Neg Hx    ??? Anesthesia  problems Neg Hx    ??? Broken bones Neg Hx    ??? Clotting disorder Neg Hx    ??? Collagen disease Neg Hx    ??? Dislocations Neg Hx    ??? Fibromyalgia Neg Hx    ??? Gout Neg Hx    ??? Hemophilia Neg Hx    ??? Osteoporosis Neg Hx    ??? Rheumatologic disease Neg Hx    ??? Scoliosis Neg Hx    ??? Severe sprains Neg Hx    ??? Sickle cell anemia Neg Hx    ??? Spinal Compression Fracture Neg Hx    ??? Melanoma Neg Hx    ??? Basal cell carcinoma Neg Hx    ??? Squamous cell carcinoma Neg Hx    ??? Deep vein thrombosis Neg Hx    ??? Cataracts Neg Hx    ??? Thyroid disease Neg Hx        Social History:     Social History     Tobacco Use   ??? Smoking status: Never Smoker   ??? Smokeless tobacco: Never Used   Vaping Use   ??? Vaping Use: Never used   Substance Use Topics   ??? Alcohol use: Not Currently     Comment: rare social   ??? Drug use: Never       Allergies:     Erythromycin, Penicillins, Sulfa (sulfonamide antibiotics), Other, Erythromycin base, and Sulfasalazine    Current Medications:     Current Outpatient Medications   Medication Sig Dispense Refill   ??? acetaminophen (TYLENOL) 500 MG tablet Take 2 tablets (1,000 mg total) by mouth Three (3) times a day. (Patient taking differently: Take 500 mg by mouth Three (3) times a day.) 30 tablet 0   ??? ADVAIR DISKUS 500-50 mcg/dose diskus Inhale 1 puff 2 (two) times a day. 60 each 11   ??? albuterol 2.5 mg /3 mL (0.083 %) nebulizer solution Inhale 3 mL (2.5 mg total) by nebulization every four (4) hours as needed for wheezing. 180 mL 2   ??? albuterol HFA 90 mcg/actuation inhaler Inhale 2 puffs every six (6) hours as needed for wheezing. 8 g 0   ??? ALPRAZolam (XANAX) 0.5 MG tablet Take 1 tablet (0.5 mg total) by mouth Three (3) times a day. 90 tablet 1   ??? ascorbic acid (VITAMIN C ORAL) Take 1 capsule by mouth nightly.     ??? blood sugar diagnostic (ACCU-CHEK GUIDE TEST STRIPS) Strp by Other route Three (3) times a day before meals. 300 strip 1   ??? blood sugar diagnostic (ACCU-CHEK GUIDE TEST STRIPS) Strp Accu-Chek Guide test strips   USE TO CHECK BLOOD SUGAR 3 TIMES DAILY BEFORE MEALS     ??? blood-glucose meter kit Use as directed 1 each 0   ??? clindamycin (CLEOCIN T) 1 % external solution clindamycin phosphate 1 % topical solution   APPLY TOPICALLY TO GROIN AREA TWICE A DAY     ??? clindamycin (CLEOCIN T) 1 % lotion APPLY TOPICALLY AT NIGHT TO INFLAMED AREAS AS NEEDED FOR FLARES     ??? clobetasoL (TEMOVATE) 0.05 % ointment Apply daily to painful affected areas if needed for flares, then stop 15 g 1   ??? colchicine (MITIGARE) 0.6 mg cap capsule colchicine 0.6 mg capsule   TAKE TWO CAPSULES BY MOUTH ON DAY ONE, followed by ONE cap ONE hour LATER. ON DAY TWO, TAKE ONE CAPSULE TWICE DAILY UNTIL resolved     ??? cyclobenzaprine (FLEXERIL) 10 MG tablet Take  1 tablet (10 mg total) by mouth Three (3) times a day as needed for muscle spasms. 60 tablet 0   ??? dextroamphetamine-amphetamine (ADDERALL XR) 20 MG 24 hr capsule Take 1 capsule by mouth every morning.     ??? dicyclomine (BENTYL) 10 mg capsule Take 1 capsule (10 mg total) by mouth Three (3) times a day. 270 capsule 1   ??? diphenoxylate-atropine (LOMOTIL) 2.5-0.025 mg per tablet Take 1 tablet by mouth two (2) times a day as needed for diarrhea. 30 tablet 5   ??? dulaglutide 1.5 mg/0.5 mL PnIj Inject 0.5 mL (1.5 mg total) under the skin every seven (7) days. (Patient taking differently: Inject 1.5 mg under the skin every seven (7) days. (Trulicity)) 2 mL 1   ??? DULoxetine (CYMBALTA) 60 MG capsule Take 1 capsule (60 mg total) by mouth Two (2) times a day. 180 capsule 0   ??? empty container (SHARPS CONTAINER) Misc Use as directed to dispose of Humira needles 1 each 2   ??? erenumab-aooe 140 mg/mL AtIn Inject the contents of 1 auto-injector (140 mg) under the skin every thirty (30) days. (Patient taking differently: Inject 140 mg under the skin every thirty (30) days. (Amiovig)) 1 mL 6   ??? famotidine (PEPCID) 20 MG tablet Take 1 tablet (20 mg total) by mouth Two (2) times a day. 180 tablet 2   ??? fluoride, sodium, 1.1 % Pste      ??? fluticasone propionate (FLONASE) 50 mcg/actuation nasal spray 1 spray into each nostril Two (2) times a day. 16 g 5   ??? glipiZIDE (GLUCOTROL) 10 MG tablet Take 2 tablets (20 mg total) by mouth Two (2) times a day (30 minutes before a meal). 360 tablet 0   ??? HUMALOG U-100 INSULIN 100 unit/mL injection Inject 120 units under the skin daily via Omnipod 40 mL 5   ??? HUMIRA PEN CITRATE FREE 40 MG/0.4 ML Inject the contents of 2 pens (80mg  total) under the skin every seven (7) days. 8 each 3   ??? hydroCHLOROthiazide (HYDRODIURIL) 12.5 MG tablet Take 1 tablet (12.5 mg total) by mouth daily. 90 tablet 1   ??? hydrocortisone (PROCTOSOL HC) 2.5 % rectal cream Insert 1 application into the rectum two (2) times a day as needed. 28.35 g 2   ??? hydrOXYzine (ATARAX) 25 MG tablet Take 1 tablet (25 mg total) by mouth Three (3) times a day as needed. 120 tablet 1   ??? ibuprofen (ADVIL,MOTRIN) 800 MG tablet Take 1 tablet (800 mg total) by mouth every eight (8) hours as needed. 90 tablet 1   ??? insulin pump cartridge Crtg Inject 120 Units under the skin daily. (Patient taking differently: Inject 200 Units under the skin. About every 2-3 days) 5 each 5   ??? insulin syringe-needle U-100 1 mL 31 gauge x 5/16 (8 mm) Syrg insulin syringe U-100 with needle 1 mL 31 gauge x 5/16   USE UP TO 5 TIMES A DAY TO ADMINISTER INSULIN     ??? ketoconazole (NIZORAL) 2 % cream Apply 1 application topically daily. To the affected area of the groin 60 g 11   ??? lancets (ACCU-CHEK FASTCLIX LANCET DRUM) Misc Use to check blood sugars 3 times a day before meals or as directed 200 each 5   ??? lancets Misc Type 2 diabetes, poorly controlled 250.02   Test 2 times daily 100 each 11   ??? lancets Misc Accu-Chek Fastclix Lancet Drum   USE TO CHECK BLOOD SUGAR THREE TIMES  DAILY BEFORE MEALS OR AS DIRECTED     ??? levothyroxine (SYNTHROID) 50 MCG tablet Take 1 tablet (50 mcg total) by mouth daily. 90 tablet 1   ??? lidocaine (XYLOCAINE) 5 % ointment lidocaine 5 % topical ointment     ??? montelukast (SINGULAIR) 10 mg tablet Take 1 tablet (10 mg total) by mouth daily. 90 tablet 3   ??? multivitamin (MULTIPLE VITAMIN ORAL) Take 1 tablet by mouth daily.     ??? ondansetron (ZOFRAN-ODT) 4 MG disintegrating tablet Take 1 tablet (4 mg total) by mouth every eight (8) hours as needed for nausea for up to 15 doses. 15 tablet 1   ??? oxybutynin (DITROPAN) 5 MG tablet Take 1 tablet (5 mg total) by mouth Two (2) times a day. 180 tablet 1   ??? pantoprazole (PROTONIX) 40 MG tablet Take 1 tablet (40 mg total) by mouth Two (2) times a day. 180 tablet 0   ??? polyethylene glycol (GLYCOLAX) 17 gram/dose powder take 17GM (DISSOLVED IN WATER) by mouth once daily     ??? PROAIR HFA 90 mcg/actuation inhaler Inhale 2 puffs every four (4) hours as needed for wheezing. 3 Inhaler 1   ??? promethazine (PHENERGAN) 25 MG tablet Take 1-2 tablets (25-50 mg total) by mouth nightly as needed for nausea. 30 tablet 3   ??? propranoloL (INDERAL LA) 120 mg 24 hr capsule Take 1 capsule (120 mg total) by mouth in the morning. 90 capsule 0   ??? sour cherry extract (TART CHERRY EXTRACT) 1,000 mg cap      ??? topiramate (TOPAMAX) 50 MG tablet Take 1.5 tablets (75 mg total) by mouth Two (2) times a day. 270 tablet 1   ??? traZODone (DESYREL) 100 MG tablet Take 2 tablets (200 mg total) by mouth nightly. 180 tablet 0   ??? doxycycline (VIBRAMYCIN) 100 MG capsule Take 1 capsule (100 mg total) by mouth Two (2) times a day. 20 capsule 0     No current facility-administered medications for this visit.       Health Maintenance:     Health Maintenance   Topic Date Due   ??? COVID-19 Vaccine (3 - Pfizer risk series) 01/24/2021   ??? Hemoglobin A1c  07/13/2021   ??? Retinal Eye Exam  09/20/2021   ??? Urine Albumin/Creatinine Ratio  10/11/2021   ??? Foot Exam  01/09/2022   ??? Serum Creatinine Monitoring  01/18/2022   ??? Potassium Monitoring  01/18/2022   ??? DTaP/Tdap/Td Vaccines (4 - Td or Tdap) 07/17/2023   ??? COPD Spirometry  05/25/2025   ??? Pneumococcal Vaccine 0-64  Completed   ??? Hepatitis C Screen  Completed   ??? Influenza Vaccine  Completed       Immunizations:     Immunization History   Administered Date(s) Administered   ??? COVID-19 VAC,MRNA,TRIS(12Y UP)(PFIZER)(GRAY CAP) 12/04/2020, 12/27/2020   ??? COVID-19 VACC,MRNA,(PFIZER)(PF)(IM) 12/04/2020, 12/27/2020   ??? Hepatitis B Vaccine, Unspecified Formulation 12/27/1999, 04/29/2000   ??? Hepatitis B, Adult 03/05/2013, 04/05/2013, 09/03/2013   ??? INFLUENZA INJ MDCK PF, QUAD,(FLUCELVAX)(9MO AND UP EGG FREE) 03/31/2019   ??? INFLUENZA TIV (TRI) PF (IM) 10/08/2011   ??? Influenza Vaccine Quad (IIV4 PF) 23mo+ injectable 03/05/2013, 03/18/2014, 03/22/2015, 03/05/2016, 03/19/2017, 02/23/2018   ??? Influenza Virus Vaccine, unspecified formulation 03/19/2017, 02/23/2018, 03/25/2019, 03/10/2020, 04/05/2021   ??? PNEUMOCOCCAL POLYSACCHARIDE 23 12/30/2012   ??? PPD Test 10/28/2016   ??? TdaP 07/18/2008, 07/16/2013   ??? Tetanus-Diptheria Toxoids-TD(TDVAX),Asdorbed,2LF(IM) 04/29/2000     I have reviewed and (if needed) updated  the patient's problem list, medications, allergies, past medical and surgical history, social and family history.     Vital Signs:     Wt Readings from Last 3 Encounters:   04/12/21 (!) 223.2 kg (492 lb)   02/05/21 (!) 224.1 kg (494 lb)   01/09/21 (!) 224.9 kg (495 lb 12 oz)     Temp Readings from Last 3 Encounters:   04/12/21 36.7 ??C (98 ??F) (Oral)   02/05/21 36.7 ??C (98 ??F) (Oral)   01/17/21 37.1 ??C (98.7 ??F) (Oral)     BP Readings from Last 3 Encounters:   04/12/21 142/82   02/05/21 136/80   01/18/21 150/77     Pulse Readings from Last 3 Encounters:   04/12/21 68   02/05/21 90   01/18/21 72     Estimated body mass index is 68.62 kg/m?? as calculated from the following:    Height as of this encounter: 180.3 cm (5' 11).    Weight as of this encounter: 223.2 kg (492 lb).  Facility age limit for growth percentiles is 20 years.      Objective:      General: Alert and oriented x3. Well-appearing. No acute distress. Morbidly obese.   HEENT:  Normocephalic.  Atraumatic. Conjunctiva and sclera normal. Audible nasal congestion. OP MMM without lesions.   Neck:  Supple. No thyroid enlargement. No adenopathy.   Heart:  Regular rate and rhythm. Normal S1, S2. No murmurs, rubs or gallops.   Lungs:  No respiratory distress.  Lungs clear to auscultation. No wheezes, rhonchi, or rales.   GI/GU:  Soft, +BS, nondistended, non-TTP. No palpable masses or organomegaly.   Extremities: Lymphedema bilateral LE. Peripheral pulses normal.   Skin:  Warm, dry. No rash or lesions present.   Neuro:  Non-focal. No obvious weakness.   Psych:  Affect normal, eye contact good, speech clear and coherent.        ??  [name], FNP

## 2021-04-12 NOTE — Unmapped (Signed)
Patient is requesting the following refill  Requested Prescriptions     Pending Prescriptions Disp Refills   ??? dulaglutide 1.5 mg/0.5 mL PnIj 2 mL 1     Sig: Inject 0.5 mL (1.5 mg total) under the skin every seven (7) days.       Recent Visits  Date Type Provider Dept   02/05/21 Office Visit [name] Rosita Fire, FNP [facility] [facility]   01/09/21 Office Visit [name] Rosita Fire, FNP [place] [facility]   10/11/20 Office Visit [name] Rosita Fire, FNP [place] [facility]   08/11/20 Office Visit [name] Rosita Fire, FNP [place] [facility]   07/04/20 Office Visit [name] Rosita Fire, FNP [place] [facility]   Showing recent visits within past 365 days with a meds authorizing provider and meeting all other requirements  Today's Visits  Date Type Provider Dept   04/12/21 Office Visit [name] Rosita Fire, FNP [place] [facility]   Showing today's visits with a meds authorizing provider and meeting all other requirements  Future Appointments  Date Type Provider Dept   07/13/21 Appointment [name] Rosita Fire, FNP [place] [facility]   Showing future appointments within next 365 days with a meds authorizing provider and meeting all other requirements       Labs:   A1c:   Hemoglobin A1C (%)   Date Value   04/12/2021 7.9 (A)   09/15/2014 6.0

## 2021-04-13 NOTE — Unmapped (Signed)
CPAP supplies ordered

## 2021-04-17 NOTE — Unmapped (Signed)
Meter data reviewed.   Logan Quinn

## 2021-04-25 MED ORDER — COLCHICINE 0.6 MG CAPSULE
ORAL_CAPSULE | 0 refills | 0 days | Status: CP
Start: 2021-04-25 — End: ?

## 2021-04-26 NOTE — Unmapped (Signed)
[facility] Refill Coordination Note    Specialty Medication(s) to be Shipped:   Inflammatory Disorders: Humira    Other medication(s) to be shipped: No additional medications requested for fill at this time     Benard Minturn, DOB: [birth year]  Phone: [phone] (home)       All above HIPAA information was verified with patient.     Was a Nurse, learning disability used for this call? No    Completed refill call assessment today to schedule patient's medication shipment from the [facility] [phone]).  All relevant notes have been reviewed.     Specialty medication(s) and dose(s) confirmed: Regimen is correct and unchanged.   Changes to medications: [employer] reports no changes at this time.  Changes to insurance: No  New side effects reported not previously addressed with a pharmacist or physician: None reported  Questions for the pharmacist: No    Confirmed patient received a Conservation officer, historic buildings and a Surveyor, mining with first shipment. The patient will receive a drug information handout for each medication shipped and additional FDA Medication Guides as required.       DISEASE/MEDICATION-SPECIFIC INFORMATION        For patients on injectable medications: Patient currently has 2 doses left.  Next injection is scheduled for 04/26/21.    SPECIALTY MEDICATION ADHERENCE     Medication Adherence    Patient reported X missed doses in the last month: 0  Specialty Medication: Humira  Patient is on additional specialty medications: No              Were doses missed due to medication being on hold? No    Humira 40/0.4 mg/ml: 1 days of medicine on hand        REFERRAL TO PHARMACIST     Referral to the pharmacist: Not needed      [facility]     Shipping address confirmed in Epic.     Delivery Scheduled: Yes, Expected medication delivery date: 05/08/21.     Medication will be delivered via Same Day Courier to the prescription address in Epic WAM.    Unk Lightning   [facility] Specialty Technician

## 2021-05-08 MED FILL — HUMIRA PEN CITRATE FREE 40 MG/0.4 ML: 28 days supply | Qty: 8 | Fill #3

## 2021-05-18 NOTE — Unmapped (Signed)
PA filed for [employer]

## 2021-05-23 NOTE — Unmapped (Signed)
PA for amovig approved through 06/09/21

## 2021-05-28 ENCOUNTER — Ambulatory Visit (INDEPENDENT_AMBULATORY_CARE_PROVIDER_SITE_OTHER): Payer: Medicare Other

## 2021-05-28 ENCOUNTER — Ambulatory Visit
Admission: EM | Admit: 2021-05-28 | Discharge: 2021-05-28 | Disposition: A | Discharge status: Home / Self Care | Payer: Medicare Other | Attending: Internal Medicine | Admitting: Internal Medicine

## 2021-05-28 ENCOUNTER — Other Ambulatory Visit: Payer: Self-pay

## 2021-05-28 ENCOUNTER — Ambulatory Visit: Admit: 2021-05-28 | Payer: Medicare Other

## 2021-05-28 DIAGNOSIS — J40 Bronchitis, not specified as acute or chronic: Secondary | ICD-10-CM

## 2021-05-28 DIAGNOSIS — R042 Hemoptysis: Secondary | ICD-10-CM | POA: Diagnosis not present

## 2021-05-28 DIAGNOSIS — J0120 Acute ethmoidal sinusitis, unspecified: Secondary | ICD-10-CM | POA: Diagnosis not present

## 2021-05-28 DIAGNOSIS — R059 Cough, unspecified: Secondary | ICD-10-CM | POA: Diagnosis not present

## 2021-05-28 MED ORDER — LEVOFLOXACIN 750 MG PO TABS: 750.0000 mg | ORAL_TABLET | Freq: Every day | ORAL | 0 refills | Status: DC

## 2021-05-28 MED ORDER — PREDNISONE 50 MG PO TABS: ORAL_TABLET | ORAL | 0 refills | Status: DC

## 2021-05-28 NOTE — ED Triage Notes (Signed)
COVID19 tests x3 at home "Negative". COVID19 vaccines done. Flu vaccine done.

## 2021-05-28 NOTE — ED Triage Notes (Signed)
Patient here for "Cough". Kids tested positive for Flu "x2 wks ago'. A week later "probably had the same". Fever "but broke now but with constant cough now". So about "2 wks of cough now". Last night severe coughing attack, spit it up and it was red. "? Blood in sputum last night". No respiratory distress. A lot of pressure in head today.

## 2021-05-28 NOTE — ED Provider Notes (Signed)
[facility]    CSN: [id number] Arrival date & time: 05/28/21  1705      History   Chief Complaint Chief Complaint  Patient presents with   Cough    HPI Jason Davidson is a 40 y.o. male who presents due to having a worsening cough x 2 weeks and sinus pressure with green nose mucous this week. Thinks had the flu initially when he got sick, but that had resolved after a few days. His son had influenza. Has been wheezing and saw some tinges of blood when he could have cough attacks today.  His glucose has been in the low 100's today.   Past Medical History:  Diagnosis Date   Asthma    Depressed    Diabetes mellitus without complication (HCC)    IBS (irritable bowel syndrome)    Morbid obesity (HCC)     Patient Active Problem List   Diagnosis Date Noted   Acquired hypothyroidism 01/09/2021   Acute non-recurrent pansinusitis 07/19/2020   Hidradenitis suppurativa 04/29/2018   Allergic rhinitis 12/23/2017   Varicose veins of both lower extremities with pain 10/16/2017   IBS (irritable bowel syndrome) 06/18/2017   Chronic venous insufficiency 12/31/2016   Lymphedema 12/31/2016   Leg pain 12/31/2016   Diabetes (HCC) 12/31/2016   GERD (gastroesophageal reflux disease) 12/31/2016   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   H/O retinal detachment 10/01/2016   No diabetic retinopathy in both eyes 10/01/2016   Cellulitis of right lower extremity 06/19/2016   Chronic joint pain 11/16/2015   Skin infection 05/15/2015   Depressed    Tick bite 10/20/2014   Primary insomnia 07/08/2014   Candidal balanitis 02/16/2014   Skin tag 01/17/2014   Left shoulder pain 10/04/2013   Anxiety 09/13/2013   Diarrhea 09/13/2013   Bleeding external hemorrhoids 09/03/2013   NAFLD (nonalcoholic fatty liver disease) 40/98/1191   Headache 07/05/2013   Abdominal pain 03/05/2013   Morbid (severe) obesity due to excess calories (HCC) 03/05/2013   Suicidal  ideation 02/09/2013   Obstructive sleep apnea (adult) (pediatric) 07/21/2012   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 Authorizing Provider  Adalimumab 40 MG/0.4ML PNKT Inject into the skin. 09/10/18  Yes [provider]  AIMOVIG 70 MG/ML SOAJ  07/21/17  Yes [provider]  ALPRAZolam Prudy Feeler) 0.5 MG tablet Take 0.5 mg by mouth at bedtime as needed for anxiety.   Yes [provider]  amphetamine-dextroamphetamine (ADDERALL) 10 MG tablet Take 10 mg by mouth 2 (two) times daily with a meal.   Yes [provider]  azelastine (ASTELIN) 0.1 % nasal spray Place into the nose.   Yes [provider]  Colchicine 0.6 MG CAPS Take 1.2 mg on Day 1 of flare, followed by 0.6 mg one hour later. Day 2 take 0.6 mg BID until flare resolves. 08/01/17  Yes [provider]  cyclobenzaprine (FLEXERIL) 10 MG tablet Take 1 tablet (10 mg total) by mouth 3 (three) times daily as needed. 01/31/18  Yes Joni Reining, PA-C  dicyclomine (BENTYL) 10 MG capsule Take 10 mg by mouth 3 (three) times daily before meals.   Yes [provider]  diphenoxylate-atropine (LOMOTIL) 2.5-0.025 MG tablet Take by mouth. 01/05/18  Yes [provider]  Dulaglutide 0.75  MG/0.5ML SOPN Inject into the skin. 10/24/20  Yes [provider]  DULoxetine (CYMBALTA) 60 MG capsule Take 120 mg by mouth daily.    Yes [provider]  fluticasone (FLONASE) 50 MCG/ACT nasal spray Place 1 spray into both nostrils 2 (two) times daily. 01/03/21  Yes [provider]  Fluticasone-Salmeterol (ADVAIR) 500-50 MCG/DOSE AEPB Inhale 1 puff into the lungs 2 (two) times daily.   Yes [provider]  glipiZIDE (GLUCOTROL) 10 MG tablet Take 10 mg by mouth daily before breakfast.   Yes [provider]  glucose blood test strip Use 1 strip 3 times a day with meter 06/26/15  Yes [provider]  HUMALOG 100 UNIT/ML injection SMARTSIG:120 Unit(s) SUB-Q Daily 03/01/20  Yes [provider]  hydrochlorothiazide (HYDRODIURIL) 12.5 MG tablet Take 12.5 mg by mouth daily. 02/05/21  Yes [provider]  hydrocortisone (ANUSOL-HC) 2.5 % rectal cream Place rectally. 12/16/16  Yes [provider]  hydrOXYzine (ATARAX/VISTARIL) 25 MG tablet Take 25 mg by mouth 4 (four) times daily as needed. 11/17/20  Yes [provider]  Insulin Syringe-Needle U-100 31G X 5/16" 1 ML MISC Use as directed with the Lantus Insulin nightly 08/26/17  Yes [provider]  ketoconazole (NIZORAL) 2 % cream Apply topically daily. 01/10/21  Yes [provider]  levofloxacin (LEVAQUIN) 750 MG tablet Take 1 tablet (750 mg total) by mouth daily. 05/28/21  Yes Rodriguez-Southworth, Nettie Elm, PA-C  lidocaine (XYLOCAINE) 2 % solution Use as directed 15 mLs in the mouth or throat every 3 (three) hours as needed for mouth pain (swish and spit). 11/09/20  Yes Eusebio Friendly B, PA-C  loperamide (IMODIUM) 2 MG capsule Take by mouth.   Yes [provider]  ondansetron (ZOFRAN-ODT) 4 MG disintegrating tablet Take 4 mg by mouth every 8 (eight) hours as needed. 01/09/21  Yes [provider]  oxybutynin (DITROPAN) 5 MG tablet Take 5 mg by mouth 2 (two) times daily. 01/09/21  Yes [provider]  pantoprazole (PROTONIX) 40 MG tablet Take 40 mg by mouth daily.   Yes [provider]  polyethylene glycol powder (GLYCOLAX/MIRALAX) powder take 17GM (DISSOLVED IN WATER) by mouth once daily 07/25/17  Yes [provider]  predniSONE (DELTASONE) 50 MG tablet One qd 05/28/21  Yes Rodriguez-Southworth, Viviana Simpler  PROAIR HFA 108 (90 Base) MCG/ACT inhaler 1-2 puffs every 4 (four) hours as needed. Last used: [id number] 07/21/17  Yes [provider]   promethazine (PHENERGAN) 25 MG tablet Take 25-50 mg by mouth at bedtime as needed. 01/09/21  Yes [provider]  promethazine-dextromethorphan (PROMETHAZINE-DM) 6.25-15 MG/5ML syrup Take 5 mLs by mouth 4 (four) times daily as needed. Patient taking differently: Take 5 mLs by mouth 4 (four) times daily as needed. 04/06/20  Yes Becky Augusta, NP  propranolol (INNOPRAN XL) 120 MG 24 hr capsule Take 120 mg by mouth at bedtime.   Yes [provider]  sodium fluoride (FLUORISHIELD) 1.1 % GEL dental gel  01/29/21  Yes [provider]  topiramate (TOPAMAX) 50 MG tablet Take 50 mg by mouth 2 (two) times daily.   Yes [provider]  traZODone (DESYREL) 100 MG tablet Take by mouth.   Yes [provider]  albuterol (VENTOLIN HFA) 108 (90 Base) MCG/ACT inhaler Inhale 1-2 puffs into the lungs every 6 (six) hours as needed for wheezing or shortness of breath. 04/06/20   Becky Augusta, NP  famotidine (PEPCID) 20 MG tablet Take by mouth. 10/15/18 10/15/19  [provider]  levothyroxine (SYNTHROID) 50 MCG tablet Take by mouth. 10/05/18 10/05/19  [provider]  montelukast (SINGULAIR) 10 MG tablet Take by mouth. 03/31/17 03/03/20  [provider]    Family History Family History  Problem Relation Age of Onset   Diabetes Mother    Depression Mother    Diabetes Sister    Diabetes Brother    Diabetes Maternal Uncle    Diabetes Maternal Grandmother    Heart disease Maternal Grandmother    Cancer Maternal Grandfather    COPD Paternal Grandmother    Cancer Paternal Grandfather    Varicose Veins Paternal Grandfather    Healthy Father     Social History Social History   Tobacco Use   Smoking status: Never   Smokeless tobacco: Never  Vaping Use   Vaping Use: Never used  Substance Use Topics   Alcohol use: No   Drug use: No     Allergies   Erythromycin, Penicillins, and Sulfa antibiotics   Review of Systems Review of Systems   Constitutional:  Negative for appetite change, chills, diaphoresis and fever.  HENT:  Positive for congestion, postnasal drip, sinus pressure and sinus pain. Negative for ear discharge, ear pain and sore throat.   Eyes:  Negative for discharge.  Respiratory:  Positive for cough, shortness of breath and wheezing. Negative for chest tightness.   Cardiovascular:  Negative for chest pain.  Gastrointestinal:  Negative for diarrhea, nausea and vomiting.  Musculoskeletal:  Negative for myalgias.  Skin:  Negative for rash.  Neurological:  Negative for headaches.  Hematological:  Negative for adenopathy.    Physical Exam Triage Vital Signs ED Triage Vitals  Enc Vitals Group     BP 05/28/21 1747 133/70     Pulse Rate 05/28/21 1747 88     Resp 05/28/21 1747 20     Temp 05/28/21 1747 98.8 F (37.1 C)     Temp Source 05/28/21 1747 Oral     SpO2 05/28/21 1747 99 %     Weight 05/28/21 1750 (!) 485 lb 10.8 oz (220.3 kg)     Height --      Head Circumference --      Peak Flow --      Pain Score 05/28/21 1747 6     Pain Loc --      Pain Edu? --      Excl. in GC? --    No data found.  Updated Vital Signs BP 133/70 (BP Location: Right Arm)    Pulse 88    Temp 98.8 F (37.1 C) (Oral)    Resp 20    Wt (!) 485 lb 10.8 oz (220.3 kg)    SpO2 99%    BMI 67.74 kg/m   Visual Acuity Right Eye Distance:   Left Eye Distance:   Bilateral Distance:    Right Eye Near:   Left Eye Near:    Bilateral Near:     Physical Exam Physical Exam Constitutional:      General: He is not in acute distress.    Appearance: He is not toxic-appearing.  HENT:     Head: Normocephalic.     Right Ear: Tympanic membrane, ear canal and external ear normal.     Left Ear: Ear canal and external ear normal.     Nose: with congestion, green mucous and has moderate tenderness on ethmoid and frontal sinuses bilaterally    Mouth/Throat:     Mouth: Mucous membranes are moist.  Pharynx: Oropharynx is clear.  Eyes:      General: No scleral icterus.    Conjunctiva/sclera: Conjunctivae normal.  Cardiovascular:     Rate and Rhythm: Normal rate and regular rhythm.     Heart sounds: No murmur heard.   Pulmonary:     Effort: Pulmonary effort is normal. No respiratory distress.     Breath sounds: Wheezing present.     Comments: Has auditory wheezing Musculoskeletal:        General: Normal range of motion.     Cervical back: Neck supple.  Lymphadenopathy:     Cervical: No cervical adenopathy.  Skin:    General: Skin is warm and dry.     Findings: No rash.  Neurological:     Mental Status: He is alert and oriented to person, place, and time.     Gait: Gait normal.  Psychiatric:        Mood and Affect: Mood normal.        Behavior: Behavior normal.        Thought Content: Thought content normal.        Judgment: Judgment normal.    UC Treatments / Results  Labs (all labs ordered are listed, but only abnormal results are displayed) Labs Reviewed - No data to display  EKG   Radiology DG Chest 2 View  Result Date: 05/28/2021 CLINICAL DATA:  Cough, hemoptysis EXAM: CHEST - 2 VIEW COMPARISON:  None. FINDINGS: The heart size and mediastinal contours are within normal limits. Both lungs are clear. The visualized skeletal structures are unremarkable. IMPRESSION: No active cardiopulmonary disease. Electronically Signed   By: Helyn Numbers M.D.   On: 05/28/2021 19:22    Procedures Procedures (including critical care time)  Medications Ordered in UC Medications - No data to display  Initial Impression / Assessment and Plan / UC Course  I have reviewed the triage vital signs and the nursing notes. Pertinent  imaging results that were available during my care of the patient were reviewed by me and considered in my medical decision making (see chart for details). Has bronchitis and sinusitis I placed him on Levaquin, Prednisone as noted. Needs to use his inhaler q 4h for 5 days. Advised to do netie  pot nose rinses bid x 5-7 days. See instructions.     Final Clinical Impressions(s) / UC Diagnoses   Final diagnoses:  Bronchitis  Acute non-recurrent ethmoidal sinusitis     Discharge Instructions      Only take 5 mg of the Glipizide while on the Levaquin since the combo can decrease your blood sugars  Do netie pot saline rinses twice a day for 5-7 days      ED Prescriptions     Medication Sig Dispense Auth. Provider   levofloxacin (LEVAQUIN) 750 MG tablet Take 1 tablet (750 mg total) by mouth daily. 7 tablet Rodriguez-Southworth, Nettie Elm, PA-C   predniSONE (DELTASONE) 50 MG tablet One qd 5 tablet Rodriguez-Southworth, Nettie Elm, PA-C      PDMP not reviewed this encounter.   Garey Ham, [place] 05/28/21 1933

## 2021-05-28 NOTE — Discharge Instructions (Addendum)
Only take 5 mg of the Glipizide while on the Levaquin since the combo can decrease your blood sugars  Do netie pot saline rinses twice a day for 5-7 days

## 2021-05-29 DIAGNOSIS — L732 Hidradenitis suppurativa: Principal | ICD-10-CM

## 2021-05-29 DIAGNOSIS — J410 Simple chronic bronchitis: Principal | ICD-10-CM

## 2021-05-29 MED ORDER — HUMIRA PEN CITRATE FREE 40 MG/0.4 ML
3 refills | 0 days
Start: 2021-05-29 — End: ?

## 2021-05-29 MED ORDER — ALBUTEROL SULFATE 2.5 MG/3 ML (0.083 %) SOLUTION FOR NEBULIZATION
RESPIRATORY_TRACT | 2 refills | 10 days | PRN
Start: 2021-05-29 — End: 2022-05-29

## 2021-05-30 DIAGNOSIS — L732 Hidradenitis suppurativa: Principal | ICD-10-CM

## 2021-05-30 MED ORDER — HUMIRA PEN CITRATE FREE 40 MG/0.4 ML
SUBCUTANEOUS | 3 refills | 0.00000 days | Status: CP
Start: 2021-05-30 — End: ?
  Filled 2021-06-04: qty 8, 28d supply, fill #0

## 2021-05-30 MED ORDER — ALBUTEROL SULFATE 2.5 MG/3 ML (0.083 %) SOLUTION FOR NEBULIZATION
RESPIRATORY_TRACT | 2 refills | 10 days | Status: CP | PRN
Start: 2021-05-30 — End: 2022-05-30

## 2021-05-30 NOTE — Unmapped (Signed)
Patient is requesting the following refill  Requested Prescriptions     Pending Prescriptions Disp Refills   ??? albuterol 2.5 mg /3 mL (0.083 %) nebulizer solution 180 mL 2     Sig: Inhale 3 mL (2.5 mg total) by nebulization every four (4) hours as needed for wheezing.       Recent Visits  Date Type Provider Dept   04/12/21 Office Visit [name] Rosita Fire, FNP [place] [facility]   02/05/21 Office Visit [name] Rosita Fire, FNP [place] [facility]   01/09/21 Office Visit [name] Rosita Fire, FNP [place] [facility]   10/11/20 Office Visit [name] Rosita Fire, FNP [place] [facility]   08/11/20 Office Visit [name] Rosita Fire, FNP [place] [facility]   07/04/20 Office Visit [name] Rosita Fire, FNP [place] [facility]   Showing recent visits within past 365 days with a meds authorizing provider and meeting all other requirements  Future Appointments  Date Type Provider Dept   07/13/21 Appointment [name] Rosita Fire, FNP  [facility] At [facility]   Showing future appointments within next 365 days with a meds authorizing provider and meeting all other requirements

## 2021-05-30 NOTE — Unmapped (Signed)
Refill for Humira

## 2021-05-31 MED ORDER — EMPTY CONTAINER
2 refills | 0 days
Start: 2021-05-31 — End: ?

## 2021-06-01 NOTE — Unmapped (Signed)
[facility] Refill Coordination Note    Specialty Medication(s) to be Shipped:   Inflammatory Disorders: Humira    Other medication(s) to be shipped: [employer], DOB: Dec 20, 1980  Phone: [phone] (home)       All above HIPAA information was verified with patient.     Was a Nurse, learning disability used for this call? No    Completed refill call assessment today to schedule patient's medication shipment from the [facility] [phone]).  All relevant notes have been reviewed.     Specialty medication(s) and dose(s) confirmed: Regimen is correct and unchanged.   Changes to medications: Logan Quinn.  Changes to insurance: No  New side effects reported not previously addressed with a pharmacist or physician: None reported  Questions for the pharmacist: No    Confirmed patient received a Conservation officer, historic buildings and a Surveyor, mining with first shipment. The patient will receive a drug information handout for each medication shipped and additional FDA Medication Guides as required.       DISEASE/MEDICATION-SPECIFIC INFORMATION        For patients on injectable medications: Patient currently has 1 doses left.  Next injection is scheduled for 05/31/2021.    SPECIALTY MEDICATION ADHERENCE     Medication Adherence    Patient reported X missed doses in the last month: 0  Specialty Medication: Humira  Patient is on additional specialty medications: No  Any gaps in refill history greater than 2 weeks in the last 3 months: no  Demonstrates understanding of importance of adherence: yes  Informant: patient  Reliability of informant: reliable  Confirmed plan for next specialty medication refill: delivery by pharmacy  Refills needed for supportive medications: not needed              Were doses missed due to medication being on hold? No        REFERRAL TO PHARMACIST     Referral to the pharmacist: Not needed      [facility]     Shipping address confirmed in Epic.     Delivery Scheduled: Yes, Expected medication delivery date: 06/05/2021.     Medication will be delivered via Same Day Courier to the prescription address in Epic WAM.    Logan Quinn   [facility] Specialty Technician

## 2021-06-04 MED FILL — EMPTY CONTAINER: 120 days supply | Qty: 1 | Fill #0

## 2021-06-06 DIAGNOSIS — N3281 Overactive bladder: Principal | ICD-10-CM

## 2021-06-06 DIAGNOSIS — Z794 Long term (current) use of insulin: Principal | ICD-10-CM

## 2021-06-06 DIAGNOSIS — N3944 Nocturnal enuresis: Principal | ICD-10-CM

## 2021-06-06 DIAGNOSIS — E1165 Type 2 diabetes mellitus with hyperglycemia: Principal | ICD-10-CM

## 2021-06-06 MED ORDER — HYDROCHLOROTHIAZIDE 12.5 MG TABLET
ORAL_TABLET | 0 refills | 0 days | Status: CP
Start: 2021-06-06 — End: ?

## 2021-06-06 MED ORDER — PANTOPRAZOLE 40 MG TABLET,DELAYED RELEASE
ORAL_TABLET | 1 refills | 0 days | Status: CP
Start: 2021-06-06 — End: ?

## 2021-06-06 MED ORDER — OXYBUTYNIN CHLORIDE 5 MG TABLET
ORAL_TABLET | 1 refills | 0 days | Status: CP
Start: 2021-06-06 — End: ?

## 2021-06-06 MED ORDER — HUMALOG U-100 INSULIN 100 UNIT/ML SUBCUTANEOUS SOLUTION
5 refills | 0 days | Status: CP
Start: 2021-06-06 — End: ?

## 2021-06-06 MED ORDER — GLIPIZIDE 10 MG TABLET
ORAL_TABLET | 1 refills | 0 days | Status: CP
Start: 2021-06-06 — End: ?

## 2021-06-06 NOTE — Unmapped (Signed)
Patient is requesting the following refill  Requested Prescriptions     Pending Prescriptions Disp Refills   ??? glipiZIDE (GLUCOTROL) 10 MG tablet [Pharmacy Med Name: GLIPIZIDE 10 MG TAB] 120 tablet 0     Sig: TAKE TWO TABLETS BY MOUTH TWICE A DAY BEFORE A MEAL   ??? pantoprazole (PROTONIX) 40 MG tablet [Pharmacy Med Name: PANTOPRAZOLE SODIUM 40 MG DR TAB] 60 tablet 0     Sig: TAKE ONE TABLET BY MOUTH TWICE A DAY   ??? oxybutynin (DITROPAN) 5 MG tablet [Pharmacy Med Name: OXYBUTYNIN CHLORIDE 5 MG TAB] 60 tablet 0     Sig: TAKE 1 TABLET BY MOUTH TWICE A DAY   ??? hydroCHLOROthiazide (HYDRODIURIL) 12.5 MG tablet [Pharmacy Med Name: HYDROCHLOROTHIAZIDE 12.5 MG TAB] 90 tablet 0     Sig: TAKE 1 TABLET BY MOUTH DAILY       Recent Visits  Date Type Provider Dept   04/12/21 Office Visit [name] Rosita Fire, FNP [place] [facility]   02/05/21 Office Visit [name] Rosita Fire, FNP [place] [facility]   01/09/21 Office Visit [name] Rosita Fire, FNP [place] [facility]   10/11/20 Office Visit [name] Rosita Fire, FNP [place] [facility]   08/11/20 Office Visit [name] Rosita Fire, FNP [place] [facility]   07/04/20 Office Visit [name] Rosita Fire, FNP [place] [facility]   Showing recent visits within past 365 days with a meds authorizing provider and meeting all other requirements  Future Appointments  Date Type Provider Dept   07/13/21 Appointment [name] Rosita Fire, FNP [place] [facility]   Showing future appointments within next 365 days with a meds authorizing provider and meeting all other requirements       Labs:   A1c:   Hemoglobin A1C (%)   Date Value   04/12/2021 7.9 (A)   09/15/2014 6.0    Creatinine:   Creatinine (mg/dL)   Date Value   82/95/6213 0.92   01/17/2014 0.74    Potassium:   Potassium (mmol/L)   Date Value   01/18/2021 4.5   01/17/2014 4.2    Sodium:   Sodium (mmol/L)   Date Value   01/18/2021 137   01/17/2014 139

## 2021-06-06 NOTE — Unmapped (Signed)
Patient is requesting the following refill  Requested Prescriptions     Pending Prescriptions Disp Refills   ??? HUMALOG U-100 INSULIN 100 unit/mL injection [Pharmacy Med Name: HUMALOG 100 UNIT/ML INJ SOLN ML] 40 mL 5     Sig: INJECT 120 UNITS UNDER THE SKIN DAILY VIA OMNIPOD       Recent Visits  Date Type Provider Dept   04/12/21 Office Visit [name] Rosita Fire, FNP [place] [facility]   02/05/21 Office Visit [name] Rosita Fire, FNP [place] [facility]   01/09/21 Office Visit [name] Rosita Fire, FNP [place] [facility]   10/11/20 Office Visit [name] Rosita Fire, FNP [place] [facility]   08/11/20 Office Visit [name] Rosita Fire, FNP [place] [facility]   07/04/20 Office Visit [name] Rosita Fire, FNP [place] [facility]   Showing recent visits within past 365 days with a meds authorizing provider and meeting all other requirements  Future Appointments  Date Type Provider Dept   07/13/21 Appointment [name] Rosita Fire, FNP [place] [facility]   Showing future appointments within next 365 days with a meds authorizing provider and meeting all other requirements       Labs:   A1c:   Hemoglobin A1C (%)   Date Value   04/12/2021 7.9 (A)   09/15/2014 6.0

## 2021-06-07 DIAGNOSIS — J42 Unspecified chronic bronchitis: Principal | ICD-10-CM

## 2021-06-07 DIAGNOSIS — J410 Simple chronic bronchitis: Principal | ICD-10-CM

## 2021-06-14 ENCOUNTER — Ambulatory Visit
Admission: EM | Admit: 2021-06-14 | Discharge: 2021-06-14 | Disposition: A | Discharge status: Home / Self Care | Payer: Commercial Managed Care - HMO | Attending: Emergency Medicine | Admitting: Emergency Medicine

## 2021-06-14 ENCOUNTER — Encounter: Payer: Self-pay | Admitting: Emergency Medicine

## 2021-06-14 ENCOUNTER — Other Ambulatory Visit: Payer: Self-pay

## 2021-06-14 DIAGNOSIS — J029 Acute pharyngitis, unspecified: Secondary | ICD-10-CM

## 2021-06-14 DIAGNOSIS — R051 Acute cough: Secondary | ICD-10-CM

## 2021-06-14 MED ORDER — BENZONATATE 100 MG PO CAPS: 100.0000 mg | ORAL_CAPSULE | Freq: Three times a day (TID) | ORAL | 0 refills | Status: DC | PRN

## 2021-06-14 MED ORDER — LIDOCAINE VISCOUS HCL 2 % MT SOLN: 15.0000 mL | OROMUCOSAL | 0 refills | Status: DC | PRN

## 2021-06-14 NOTE — Discharge Instructions (Addendum)
Use the viscous lidocaine and take the [facility] as directed.  Follow up with your primary care provider if your symptoms are not improving.

## 2021-06-14 NOTE — ED Provider Notes (Signed)
Jason Davidson    CSN: [id number] Arrival date & time: 06/14/21  1803      History   Chief Complaint Chief Complaint  Patient presents with   Cough   Sore Throat    HPI Jason Davidson is a 41 y.o. male.  Patient presents with 1 month history of sore throat and nonproductive cough.  Jason Davidson denies fever, rash, chest pain, shortness of breath, vomiting, diarrhea, or other symptoms.  Treatment at home with OTC cough syrup.  Patient was seen at [facility] urgent care on 05/28/2021; diagnosed with bronchitis and sinusitis; treated with levofloxacin and prednisone.  His medical history includes asthma, diabetes, morbid obesity.  The history is provided by the patient and medical records.   Past Medical History:  Diagnosis Date   Asthma    Depressed    Diabetes mellitus without complication ([place])    IBS (irritable bowel syndrome)    Morbid obesity ([place])     Patient Active Problem List   Diagnosis Date Noted   Acquired hypothyroidism 01/09/2021   Acute non-recurrent pansinusitis 07/19/2020   Hidradenitis suppurativa 04/29/2018   Allergic rhinitis 12/23/2017   Varicose veins of both lower extremities with pain 10/16/2017   IBS (irritable bowel syndrome) 06/18/2017   Chronic venous insufficiency 12/31/2016   Lymphedema 12/31/2016   Leg pain 12/31/2016   Diabetes ([place]) 12/31/2016   GERD (gastroesophageal reflux disease) 12/31/2016   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   H/O retinal detachment 10/01/2016   No diabetic retinopathy in both eyes 10/01/2016   Cellulitis of right lower extremity 06/19/2016   Chronic joint pain 11/16/2015   Skin infection 05/15/2015   Depressed    Tick bite 10/20/2014   Primary insomnia 07/08/2014   Candidal balanitis 02/16/2014   Skin tag 01/17/2014   Left shoulder pain 10/04/2013   Anxiety 09/13/2013   Diarrhea 09/13/2013   Bleeding external hemorrhoids 09/03/2013   NAFLD (nonalcoholic fatty liver disease)  08/12/2013   Headache 07/05/2013   Abdominal pain 03/05/2013   Morbid (severe) obesity due to excess calories ([place]) 03/05/2013   Suicidal ideation 02/09/2013   Obstructive sleep apnea (adult) (pediatric) 07/21/2012   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 Authorizing Provider  benzonatate (TESSALON) 100 MG capsule Take 1 capsule (100 mg total) by mouth 3 (three) times daily as needed for cough. 06/14/21  Yes Sharion Balloon, NP  lidocaine (XYLOCAINE) 2 % solution Use as directed 15 mLs in the mouth or throat as needed for mouth pain. 06/14/21  Yes Sharion Balloon, NP  Adalimumab 40 MG/0.4ML PNKT Inject into the skin. 09/10/18   [provider]  AIMOVIG 70 MG/ML SOAJ  07/21/17   [provider]  albuterol (VENTOLIN HFA) 108 (90 Base) MCG/ACT inhaler Inhale 1-2 puffs into the lungs every 6 (six) hours as needed for wheezing or shortness of breath. 04/06/20   Margarette Canada, NP  ALPRAZolam Duanne Moron) 0.5 MG tablet Take 0.5 mg by mouth at bedtime as needed for anxiety.    [provider]  amphetamine-dextroamphetamine (ADDERALL) 10 MG tablet Take 10 mg by mouth 2 (two) times daily with a meal.    [provider]  azelastine (ASTELIN) 0.1 % nasal spray Place into the nose.    [provider]  Colchicine 0.6 MG CAPS Take 1.2 mg on Day 1 of flare, followed by 0.6 mg one hour later. Day 2 take 0.6 mg BID until flare resolves. 08/01/17   [provider]  cyclobenzaprine (FLEXERIL) 10 MG tablet Take 1 tablet (10 mg total) by mouth 3 (three) times daily as needed. 01/31/18   Sable Feil, PA-C  dicyclomine (BENTYL) 10 MG capsule Take 10 mg by mouth 3 (three) times daily before meals.    [provider]  diphenoxylate-atropine (LOMOTIL) 2.5-0.025 MG tablet Take by mouth. 01/05/18    [provider]  Dulaglutide 0.75 MG/0.5ML SOPN Inject into the skin. 10/24/20   [provider]  DULoxetine (CYMBALTA) 60 MG capsule Take 120 mg by mouth daily.     [provider]  famotidine (PEPCID) 20 MG tablet Take by mouth. 10/15/18 10/15/19  [provider]  fluticasone (FLONASE) 50 MCG/ACT nasal spray Place 1 spray into both nostrils 2 (two) times daily. 01/03/21   [provider]  Fluticasone-Salmeterol (ADVAIR) 500-50 MCG/DOSE AEPB Inhale 1 puff into the lungs 2 (two) times daily.    [provider]  glipiZIDE (GLUCOTROL) 10 MG tablet Take 10 mg by mouth daily before breakfast.    [provider]  glucose blood test strip Use 1 strip 3 times a day with meter 06/26/15   [provider]  HUMALOG 100 UNIT/ML injection SMARTSIG:120 Unit(s) SUB-Q Daily 03/01/20   [provider]  hydrochlorothiazide (HYDRODIURIL) 12.5 MG tablet Take 12.5 mg by mouth daily. 02/05/21   [provider]  hydrocortisone (ANUSOL-HC) 2.5 % rectal cream Place rectally. 12/16/16   [provider]  hydrOXYzine (ATARAX/VISTARIL) 25 MG tablet Take 25 mg by mouth 4 (four) times daily as needed. 11/17/20   [provider]  Insulin Syringe-Needle U-100 31G X 5/16" 1 ML MISC Use as directed with the Lantus Insulin nightly 08/26/17   [provider]  ketoconazole (NIZORAL) 2 % cream Apply topically daily. 01/10/21   [provider]  levofloxacin (LEVAQUIN) 750 MG tablet Take 1 tablet (750 mg total) by mouth daily. 05/28/21   Rodriguez-Southworth, Sunday Spillers, PA-C  levothyroxine (SYNTHROID) 50 MCG tablet Take by mouth. 10/05/18 10/05/19  [provider]  loperamide (IMODIUM) 2 MG capsule Take by mouth.    [provider]  montelukast (SINGULAIR) 10 MG tablet Take by mouth. 03/31/17 03/03/20  [provider]  ondansetron (ZOFRAN-ODT) 4 MG disintegrating tablet Take 4 mg by mouth every 8 (eight)  hours as needed. 01/09/21   [provider]  oxybutynin (DITROPAN) 5 MG tablet Take 5 mg by mouth 2 (two) times daily. 01/09/21   [provider]  pantoprazole (PROTONIX) 40 MG tablet Take 40 mg by mouth daily.    [provider]  polyethylene glycol powder (GLYCOLAX/MIRALAX) powder take 17GM (DISSOLVED IN WATER) by mouth once daily 07/25/17   [provider]  predniSONE (DELTASONE) 50 MG tablet One qd 05/28/21   Rodriguez-Southworth, Sunday Spillers, PA-C  PROAIR HFA 108 (90 Base) MCG/ACT inhaler 1-2 puffs every 4 (four) hours as needed. Last used: [id number] 07/21/17   [provider]  promethazine (PHENERGAN) 25 MG tablet Take 25-50 mg by mouth at bedtime as needed. 01/09/21   [provider]  promethazine-dextromethorphan (PROMETHAZINE-DM) 6.25-15 MG/5ML syrup Take 5 mLs by mouth 4 (four) times daily as needed. Patient taking differently: Take 5 mLs by mouth 4 (four) times daily as needed. 04/06/20   Margarette Canada, NP  propranolol (INNOPRAN XL) 120 MG 24 hr  capsule Take 120 mg by mouth at bedtime.    [provider]  sodium fluoride (FLUORISHIELD) 1.1 % GEL dental gel  01/29/21   [provider]  topiramate (TOPAMAX) 50 MG tablet Take 50 mg by mouth 2 (two) times daily.    [provider]  traZODone (DESYREL) 100 MG tablet Take by mouth.    [provider]    Family History Family History  Problem Relation Age of Onset   Diabetes Mother    Depression Mother    Diabetes Sister    Diabetes Brother    Diabetes Maternal Uncle    Diabetes Maternal Grandmother    Heart disease Maternal Grandmother    Cancer Maternal Grandfather    COPD Paternal Grandmother    Cancer Paternal Grandfather    Varicose Veins Paternal Grandfather    Healthy Father     Social History Social History   Tobacco Use   Smoking status: Never   Smokeless tobacco: Never  Vaping Use   Vaping Use: Never used  Substance Use Topics   Alcohol  use: No   Drug use: No     Allergies   Erythromycin, Penicillins, and Sulfa antibiotics   Review of Systems Review of Systems  Constitutional:  Negative for chills and fever.  HENT:  Positive for sore throat. Negative for ear pain.   Respiratory:  Positive for cough. Negative for shortness of breath.   Cardiovascular:  Negative for chest pain and palpitations.  Gastrointestinal:  Negative for diarrhea and vomiting.  Skin:  Negative for color change and rash.  All other systems reviewed and are negative.   Physical Exam Triage Vital Signs ED Triage Vitals [06/14/21 1835]  Enc Vitals Group     BP (!) 156/93     Pulse Rate (!) 114     Resp 20     Temp 98.1 F (36.7 C)     Temp src      SpO2 96 %     Weight      Height      Head Circumference      Peak Flow      Pain Score      Pain Loc      Pain Edu?      Excl. in [place]?    No data found.  Updated Vital Signs BP (!) 156/93 (BP Location: Left Arm)    Pulse (!) 114    Temp 98.1 F (36.7 C)    Resp 20    SpO2 96%   Visual Acuity Right Eye Distance:   Left Eye Distance:   Bilateral Distance:    Right Eye Near:   Left Eye Near:    Bilateral Near:     Physical Exam Vitals and nursing note reviewed.  Constitutional:      General: Jason Davidson is not in acute distress.    Appearance: Jason Davidson is well-developed. Jason Davidson is obese. Jason Davidson is not ill-appearing.  HENT:     Right Ear: Tympanic membrane normal.     Left Ear: Tympanic membrane normal.     Nose: Nose normal.     Mouth/Throat:     Mouth: Mucous membranes are moist.     Pharynx: Oropharynx is clear.  Cardiovascular:     Rate and Rhythm: Normal rate and regular rhythm.     Heart sounds: Normal heart sounds.  Pulmonary:     Effort: Pulmonary effort is normal. No respiratory distress.     Breath sounds: Normal breath sounds.  Musculoskeletal:     Cervical back: Neck supple.  Skin:    General: Skin is warm and dry.  Neurological:     Mental Status: Jason Davidson is alert.  Psychiatric:         Mood and Affect: Mood normal.        Behavior: Behavior normal.     UC Treatments / Results  Labs (all labs ordered are listed, but only abnormal results are displayed) Labs Reviewed - No data to display  EKG   Radiology No results found.  Procedures Procedures (including critical care time)  Medications Ordered in UC Medications - No data to display  Initial Impression / Assessment and Plan / UC Course  I have reviewed the triage vital signs and the nursing notes.  Pertinent labs & imaging results that were available during my care of the patient were reviewed by me and considered in my medical decision making (see chart for details).    Sore throat, cough.  Afebrile, exam is reassuring.  Patient was recently treated with antibiotic and prednisone.  Treating today with viscous lidocaine as needed and Tessalon Perles as needed.  Discussed Tylenol or ibuprofen.  Instructed patient to follow-up with his PCP next week if his symptoms are not improving.  Jason Davidson agrees to plan of care.     Final Clinical Impressions(s) / UC Diagnoses   Final diagnoses:  Sore throat  Acute cough     Discharge Instructions      Use the viscous lidocaine and take the Tessalon Perles as directed.  Follow up with your primary care provider if your symptoms are not improving.         ED Prescriptions     Medication Sig Dispense Auth. Provider   lidocaine (XYLOCAINE) 2 % solution Use as directed 15 mLs in the mouth or throat as needed for mouth pain. 100 mL Sharion Balloon, NP   benzonatate (TESSALON) 100 MG capsule Take 1 capsule (100 mg total) by mouth 3 (three) times daily as needed for cough. 21 capsule Sharion Balloon, NP      PDMP not reviewed this encounter.   Sharion Balloon, NP 06/14/21 1914

## 2021-06-14 NOTE — ED Triage Notes (Signed)
Pt here cough and sore throat x 1 month. Has breathing treatments at home that are not working as well as OTC meds.

## 2021-06-21 NOTE — Unmapped (Signed)
Readings reviewed.   KWM

## 2021-06-21 NOTE — Unmapped (Signed)
[facility] Refill Coordination Note    Specialty Medication(s) to be Shipped:   Inflammatory Disorders: Humira 40mg /0.63ml Inj  Other medication(s) to be shipped: No additional medications requested for fill at this time     Logan Quinn, DOB: [birth year]  Phone: [phone] (home)     All above HIPAA information was verified with patient.     Was a Nurse, learning disability used for this call? No    Completed refill call assessment today to schedule patient's medication shipment from the [facility] [phone]).  All relevant notes have been reviewed.     Specialty medication(s) and dose(s) confirmed: Regimen is correct and unchanged.   Changes to medications: [place] reports no changes at this time.  Changes to insurance: No  New side effects reported not previously addressed with a pharmacist or physician: None reported  Questions for the pharmacist: No    Confirmed patient received a Conservation officer, historic buildings and a Surveyor, mining with first shipment. The patient will receive a drug information handout for each medication shipped and additional FDA Medication Guides as required.       DISEASE/MEDICATION-SPECIFIC INFORMATION        For patients on injectable medications: Patient currently has 2 doses left.  Next injection is scheduled for 06/21/2021 & 06/28/2021.    SPECIALTY MEDICATION ADHERENCE     Medication Adherence    Patient reported X missed doses in the last month: 0  Specialty Medication: Humira 40mg /0.33ml Inj  Patient is on additional specialty medications: No  Patient is on more than two specialty medications: No  Informant: patient  Reliability of informant: reliable  Reasons for non-adherence: no problems identified        Were doses missed due to medication being on hold? No    Humira 40mg /0.75ml Inj: 14 days of medicine on hand     REFERRAL TO PHARMACIST     Referral to the pharmacist: Not needed    [facility]     Shipping address confirmed in Epic.     Delivery Scheduled: Yes, Expected medication delivery date: 06/28/2021.     Medication will be delivered via UPS to the prescription address in Epic WAM.    Ozro Russett P Wetzel [place] [facility] Specialty Technician

## 2021-06-26 DIAGNOSIS — G43909 Migraine, unspecified, not intractable, without status migrainosus: Principal | ICD-10-CM

## 2021-06-26 MED ORDER — AIMOVIG AUTOINJECTOR 140 MG/ML SUBCUTANEOUS AUTO-INJECTOR
SUBCUTANEOUS | 1 refills | 0.00000 days | Status: CP
Start: 2021-06-26 — End: 2022-06-26

## 2021-06-26 NOTE — Unmapped (Signed)
Patient is requesting the following refill  Requested Prescriptions     Pending Prescriptions Disp Refills   ??? erenumab-aooe (AIMOVIG AUTOINJECTOR) 140 mg/mL AtIn [Pharmacy Med Name: AIMOVIG 140 MG/ML SUBQ SOLN ML] 3 mL 1     Sig: Inject 140 mg under the skin every thirty (30) days.       Recent Visits  Date Type Provider Dept   04/12/21 Office Visit [name] Rosita Fire, FNP [place] [facility]   02/05/21 Office Visit [name] Rosita Fire, FNP [place] [facility]   01/09/21 Office Visit [name] Rosita Fire, FNP [place] [facility]   10/11/20 Office Visit [name] Rosita Fire, FNP [place] [facility]   08/11/20 Office Visit [name] Rosita Fire, FNP [place] [facility]   07/04/20 Office Visit [name] Rosita Fire, FNP [place] [facility]   Showing recent visits within past 365 days with a meds authorizing provider and meeting all other requirements  Future Appointments  Date Type Provider Dept   07/13/21 Appointment [name] Rosita Fire, FNP [place] [facility]   Showing future appointments within next 365 days with a meds authorizing provider and meeting all other requirements       Labs: Not applicable this refill

## 2021-06-27 DIAGNOSIS — L732 Hidradenitis suppurativa: Principal | ICD-10-CM

## 2021-06-27 NOTE — Unmapped (Signed)
Logan Quinn 's Humira shipment will be delayed as a result of prior authorization being required by the patient's insurance.     I have reached out to the patient  at [phone] and communicated the delay. We will call the patient back to reschedule the delivery upon resolution. We have not confirmed the new delivery date.

## 2021-07-10 DIAGNOSIS — L732 Hidradenitis suppurativa: Principal | ICD-10-CM

## 2021-07-10 MED ORDER — HUMIRA(CF) PEN 80 MG/0.8 ML SUBCUTANEOUS KIT
SUBCUTANEOUS | 11 refills | 0.00000 days | Status: CP
Start: 2021-07-10 — End: ?
  Filled 2021-07-27: qty 4, 28d supply, fill #0

## 2021-07-10 NOTE — Unmapped (Signed)
Clinical Assessment Needed For: Dose Change  Medication: Humira cf pen 80mg /0.82ml  Last Fill Date/Day Supply: 06/04/21/ / 28 days  Prior Authorization Required  Was previous dose already scheduled to fill: Yes    Notes to Pharmacist: Two 40mg  pens weekly were denied. Attempting PA for 80mg  pens.

## 2021-07-13 ENCOUNTER — Ambulatory Visit: Admit: 2021-07-13 | Discharge: 2021-07-13 | Payer: MEDICARE | Attending: Family | Primary: Family

## 2021-07-13 DIAGNOSIS — E1165 Type 2 diabetes mellitus with hyperglycemia: Principal | ICD-10-CM

## 2021-07-13 DIAGNOSIS — Z794 Long term (current) use of insulin: Principal | ICD-10-CM

## 2021-07-13 DIAGNOSIS — J42 Unspecified chronic bronchitis: Principal | ICD-10-CM

## 2021-07-13 DIAGNOSIS — I10 Essential (primary) hypertension: Principal | ICD-10-CM

## 2021-07-13 DIAGNOSIS — F3341 Major depressive disorder, recurrent, in partial remission: Principal | ICD-10-CM

## 2021-07-13 DIAGNOSIS — E039 Hypothyroidism, unspecified: Principal | ICD-10-CM

## 2021-07-13 MED ORDER — CYCLOBENZAPRINE 10 MG TABLET
ORAL_TABLET | Freq: Three times a day (TID) | ORAL | 1 refills | 20.00000 days | Status: CP | PRN
Start: 2021-07-13 — End: 2022-07-13

## 2021-07-13 NOTE — Unmapped (Signed)
Assessment and Plan:     Logan Quinn was seen today for diabetes and hypothyroidism.    Diagnoses and all orders for this visit:    Type 2 diabetes mellitus with hyperglycemia, with long-term current use of insulin (CMS-HCC)  HGB A1c??7.3 (down??from 7.9 three??months ago).   DM??uncontrolled. Continue??glipizide 20 mg BID,??Trulicity 1.5 mg weekly, and??use of OmniPod??with Humalog.  Encouraged patient to continue carb controlled diet and regular exercise.??  -     POCT glycosylated hemoglobin (Hb A1C); Future  -     POCT glycosylated hemoglobin (Hb A1C)    Acquired hypothyroidism  Last TSH 1.637, wnl (01/09/21). Continue levothyroxine 50 mcg daily       Essential hypertension  BP??near??goal??(142/82??in clinic today).   Continue hydrochlorothiazide 25 mg daily.   Reviewed low sodium diet and encouraged regular exercise.   Advised to continue to monitor and log at-home BP readings. Contact clinic if readings become elevated.    Recurrent major depressive disorder, in partial remission (CMS-HCC)  Patient following closely with psychiatry. Continue duloxetine as ordered. No SI or HI.     Chronic bronchitis, unspecified chronic bronchitis type (CMS-HCC)  Overall stable. Continue daily maintenance with Advair and rescue inhaler, albuterol, prn.  ??  Other orders  -     cyclobenzaprine (FLEXERIL) 10 MG tablet; Take 1 tablet (10 mg total) by mouth Three (3) times a day as needed for muscle spasms.        I personally spent 42 minutes face-to-face and non-face-to-face in the care of this patient, which includes all pre, intra, and post visit time on the date of service.    Return in about 3 months (around 10/10/2021) for Next scheduled follow up.    HPI:      Logan Quinn  is here for   Chief Complaint   Patient presents with   ??? Diabetes   ??? Hypothyroidism     Depression: Patient presents for follow-up of depression. PHQ9 score goal  <10.  Depression has customarily been at goal. Current symptoms include fatigue. Symptoms have been well controlled. Current stressors: none. Patient denies anhedonia, depressed mood, hopelessness, insomnia, hypersomnia, changes in appetite, poor appetite, low self-esteem, difficulty concentrating, psychomotor agitation, psychomotor retardation, suicidal ideation with plan and suicidal ideation without plan. Current treatment includes: medication: duloxetine. Complains of the following side effects from the treatment: none.     Diabetes: Patient presents??for follow up of diabetes.????A1C goal is <8. ??Diabetes has customarily not been??at goal (complicated by: obesity). ??Current symptoms include: hyperglycemia, polydipsia.  Symptoms have??been well-controlled. Patient denies??foot ulcerations, hypoglycemia , increase appetite, nausea, paresthesia of the feet, polyuria, visual disturbances, vomitting and weight loss. Evaluation to date has included:??hemoglobin A1C. ??Home sugars: 55-130. Current treatment:??Continued??glipizide, Trulicity, and??OmniPod with Humalog??which has been somewhat effective. ??Doing regular exercise: no.    Omnipod Dash changed to Omnipod 5.   Dexcom will communicate with the Omnipod.   Noticing nighttime low BS. 55-65 (hypoglycemia). Monitoring device has alarm to notify patient during the night for low BS.   No altered LOC.   Take 2 glipizide in AM 2 glipizide and 2 glipizide in PM.   IAfter supper checks BS. Does correction.   Just before bedtime, if BS is between 120-130 no drops during the night. Below 115, experiences hypoglycemia during the night.   101 to 110 previous night, 81 fasting the next AM. Feels a little shaky with BS of 81.     Hypothyroid: Patient presents for??follow-up??of hypothyroidism. Current symptoms: weight changes??.  Patient denies change in energy level, diarrhea, heat / cold intolerance, nervousness, and palpitations. Symptoms have??been well-controlled.??He??is taking medications on a regular basis. Current therapy includes: levothyroxine 50??mcg daily.   ??  ??Hypertension: Patient presents for??follow-up??of hypertension. Blood pressure goal <??140/90. ??Hypertension has customarily been??at goal complicated by obesity, DM. ??Home blood pressure readings: patient reports BP is up and down, more high readings. Salt intake and diet:??salt not added to cooking and salt shaker not on table. Associated signs and symptoms:??dizziness. While driving yesterday, he reports the truck felt like it was spinning and he had to pull over. Patient denies:??blurred vision, chest pain, dyspnea, headache, neck aches, orthopnea, palpitations, paroxysmal nocturnal dyspnea, peripheral edema, pulsating in the ears and tiredness/fatigue. Medication compliance:??taking as prescribed.??Patient took hydrochlorothiazide at 8 AM this morning. He??is not??doing regular exercise. ??    Obesity: Patient notes initially trulicity and adderall had diminished his appetite. Medications do not seem to be helping with this currently.     Weight fluctuations, low as 475 lbs.       ROS:      Comprehensive 10 point ROS negative unless otherwise stated in the HPI.        Answers for HPI/ROS submitted by the patient on 07/12/2021  Diabetes type: type 2  MedicAlert ID: No  Disease duration: 8 Years  blurred vision: No  chest pain: No  fatigue: No  foot paresthesias: No  foot ulcerations: No  polydipsia: Yes  polyphagia: No  polyuria: No  visual change: No  weakness: No  weight loss: No  Symptom course: stable  confusion: No  dizziness: No  headaches: No  hunger: No  mood changes: No  nervous/anxious: No  pallor: No  seizures: No  sleepiness: No  speech difficulty: No  sweats: No  tremors: No  blackouts: No  hospitalization: No  nocturnal hypoglycemia: No  required assistance: No  required glucagon: No  CVA: No  heart disease: No  impotence: Yes  nephropathy: No  peripheral neuropathy: No  PVD: No  retinopathy: No  CAD risks: family history, hypertension, obesity, sedentary lifestyle  Current treatments: diet, insulin pump, oral agent (monotherapy)  Treatment compliance: most of the time  Dose schedule: pre-breakfast, pre-lunch, pre-dinner, at bedtime  Given by: patient  Injection sites: abdominal wall, thighs  Monitoring compliance: adequate  Blood glucose trend: decreasing steadily  Current diet: diabetic, generally healthy, low salt  Meal planning: calorie counting, carbohydrate counting  Exercise: intermittently  Dietitian visit: No  Eye exam current: Yes  Sees podiatrist: Yes              [facility] Components:     Medication adherence and barriers to the treatment plan have been addressed. Opportunities to optimize healthy behaviors have been discussed. Patient / caregiver voiced understanding.    Past Medical/Surgical History:     Past Medical History:   Diagnosis Date   ??? Acne    ??? Acquired hypothyroidism 01/09/2021   ??? Allergic    ??? Anxiety    ??? Depression    ??? Diabetes mellitus (CMS-HCC) Dx 2013    Type II   ??? GERD (gastroesophageal reflux disease)    ??? Gout    ??? Hypertension    ??? IBS (irritable bowel syndrome)    ??? 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 Liver disease    ??? Migraines    ??? Morbid obesity with BMI of 60.0-69.9, adult (CMS-HCC)    ??? 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 Obstructive  sleep apnea    ??? OSA on CPAP    ??? Severe obstructive sleep apnea    ??? Trapezius muscle strain 12/07/2013   ??? Urinary incontinence, nocturnal enuresis    ??? Venous insufficiency      Past Surgical History:   Procedure Laterality Date   ??? EYE SURGERY  11/15   ??? PR COLONOSCOPY FLX DX W/COLLJ SPEC WHEN PFRMD N/A 03/24/2013    Procedure: COLONOSCOPY, FLEXIBLE, PROXIMAL TO SPLENIC FLEXURE; DIAGNOSTIC, W/WO COLLECTION SPECIMEN BY BRUSH OR WASH;  Surgeon: Clint Bolder, MD;  [facility];  Service: Gastroenterology   ??? PR EYE SURG POST SGMT PROC UNLISTED Left     pneumatic retinopexy OS   ??? PR UPPER GI ENDOSCOPY,DIAGNOSIS N/A 02/02/2013    Procedure: UGI ENDO, INCLUDE ESOPHAGUS, STOMACH, & DUODENUM &/OR JEJUNUM; DX W/WO COLLECTION SPECIMN, BY BRUSH OR WASH;  Surgeon: Malcolm Metro, MD;  [facility]: [facility];  Service: Gastroenterology   ??? [place] PYLORIC STENOSIS ( HISTORICAL RESULT)         Family History:     Family History   Problem Relation Age of Onset   ??? Cancer Maternal Grandfather    ??? Hearing loss Maternal Grandfather    ??? Cancer Paternal Grandfather    ??? COPD Paternal Grandmother    ??? Arthritis Paternal Grandmother    ??? Depression Paternal Grandmother    ??? Diabetes Mother    ??? Heart disease Mother    ??? Migraines Mother    ??? Arthritis Mother    ??? Depression Mother    ??? GER disease Mother    ??? Hypertension Mother    ??? Angina Mother    ??? COPD Mother    ??? Glaucoma Mother    ??? Hearing loss Mother    ??? Diabetes Sister    ??? Migraines Sister    ??? Asthma Sister    ??? Depression Sister    ??? Hearing loss Sister    ??? Diabetes Brother    ??? Asthma Brother    ??? Diabetes Maternal Grandmother    ??? Heart disease Maternal Grandmother    ??? Migraines Maternal Grandmother    ??? Depression Maternal Grandmother    ??? Angina Maternal Grandmother    ??? Hypertension Maternal Grandmother    ??? Stroke Maternal Grandmother    ??? Diabetes Maternal Uncle    ??? Hearing loss Maternal Uncle    ??? Diabetes Maternal Uncle         resulted in need for kidney transplant   ??? Liver disease Maternal Uncle    ??? Kidney disease Maternal Uncle         needed kidney transplant   ??? Asthma Brother    ??? No Known Problems Father    ??? No Known Problems Paternal Aunt    ??? No Known Problems Paternal Uncle    ??? No Known Problems Other    ??? Colorectal Cancer Neg Hx    ??? Esophageal cancer Neg Hx    ??? Liver cancer Neg Hx    ??? Pancreatic cancer Neg Hx    ??? Stomach cancer Neg Hx    ??? Amblyopia Neg Hx    ??? Blindness Neg Hx    ??? Retinal detachment Neg Hx    ??? Strabismus Neg Hx    ??? Macular degeneration Neg Hx    ??? Anesthesia problems Neg Hx    ??? Broken bones Neg Hx    ???  Clotting disorder Neg Hx    ??? Collagen disease Neg Hx    ??? Dislocations Neg Hx    ??? Fibromyalgia Neg Hx    ??? Gout Neg Hx    ??? Hemophilia Neg Hx    ??? Osteoporosis Neg Hx    ??? Rheumatologic disease Neg Hx    ??? Scoliosis Neg Hx    ??? Severe sprains Neg Hx    ??? Sickle cell anemia Neg Hx    ??? Spinal Compression Fracture Neg Hx    ??? Melanoma Neg Hx    ??? Basal cell carcinoma Neg Hx    ??? Squamous cell carcinoma Neg Hx    ??? Deep vein thrombosis Neg Hx    ??? Cataracts Neg Hx    ??? Thyroid disease Neg Hx        Social History:     Social History     Tobacco Use   ??? Smoking status: Never   ??? Smokeless tobacco: Never   Vaping Use   ??? Vaping Use: Never used   Substance Use Topics   ??? Alcohol use: Not Currently     Comment: rare social   ??? Drug use: Never       Allergies:     Erythromycin, Penicillins, Sulfa (sulfonamide antibiotics), and Other    Current Medications:     Current Outpatient Medications   Medication Sig Dispense Refill   ??? acetaminophen (TYLENOL) 500 MG tablet Take 2 tablets (1,000 mg total) by mouth Three (3) times a day. (Patient taking differently: Take 500 mg by mouth Three (3) times a day.) 30 tablet 0   ??? adalimumab (HUMIRA,CF, PEN) 80 mg/0.8 mL PnKt Inject the contents of 1 pen (80mg ) under the skin once a week. 4 each 11   ??? ADVAIR DISKUS 500-50 mcg/dose diskus Inhale 1 puff  in the morning and 1 puff in the evening. 60 each 11   ??? albuterol 2.5 mg /3 mL (0.083 %) nebulizer solution Inhale 3 mL (2.5 mg total) by nebulization every four (4) hours as needed for wheezing. 180 mL 2   ??? albuterol HFA 90 mcg/actuation inhaler Inhale 2 puffs every six (6) hours as needed for wheezing. 8 g 5   ??? ALPRAZolam (XANAX) 0.5 MG tablet Take 1 tablet (0.5 mg total) by mouth Three (3) times a day. 90 tablet 1   ??? ascorbic acid (VITAMIN C ORAL) Take 1 capsule by mouth nightly.     ??? blood sugar diagnostic (ACCU-CHEK GUIDE TEST STRIPS) Strp by Other route Three (3) times a day before meals. 300 strip 1   ??? blood sugar diagnostic (ACCU-CHEK GUIDE TEST STRIPS) Strp Accu-Chek Guide test strips   USE TO CHECK BLOOD SUGAR 3 TIMES DAILY BEFORE MEALS     ??? blood-glucose meter kit Use as directed 1 each 0   ??? clindamycin (CLEOCIN T) 1 % external solution clindamycin phosphate 1 % topical solution   APPLY TOPICALLY TO GROIN AREA TWICE A DAY     ??? clindamycin (CLEOCIN T) 1 % lotion APPLY TOPICALLY AT NIGHT TO INFLAMED AREAS AS NEEDED FOR FLARES     ??? clobetasoL (TEMOVATE) 0.05 % ointment Apply daily to painful affected areas if needed for flares, then stop 15 g 1   ??? colchicine (MITIGARE) 0.6 mg cap capsule TAKE 2 CAPSULES BY MOUTH ON DAY ONE, FOLLOWED BY ONE CAPSULE ONE HOUR LATER, ON DAY 2 TAKE ONE CAPSULE BY MOUTH TWICEA DAY UNTIL RESOLVED 60 capsule 0   ???  cyclobenzaprine (FLEXERIL) 10 MG tablet Take 1 tablet (10 mg total) by mouth Three (3) times a day as needed for muscle spasms. 60 tablet 0   ??? dextroamphetamine-amphetamine (ADDERALL XR) 20 MG 24 hr capsule Take 1 capsule by mouth every morning.     ??? dicyclomine (BENTYL) 10 mg capsule Take 1 capsule (10 mg total) by mouth Three (3) times a day. 270 capsule 1   ??? diphenoxylate-atropine (LOMOTIL) 2.5-0.025 mg per tablet Take 1 tablet by mouth two (2) times a day as needed for diarrhea. 30 tablet 5   ??? dulaglutide 1.5 mg/0.5 mL PnIj Inject 0.5 mL (1.5 mg total) under the skin every seven (7) days. 2 mL 2   ??? DULoxetine (CYMBALTA) 60 MG capsule Take 1 capsule (60 mg total) by mouth Two (2) times a day. 180 capsule 0   ??? empty container (SHARPS CONTAINER) Misc Use as directed to dispose of Humira needles 1 each 2   ??? empty container Misc Use as directed 1 each 2   ??? erenumab-aooe (AIMOVIG AUTOINJECTOR) 140 mg/mL AtIn Inject 140 mg under the skin every thirty (30) days. 3 mL 1   ??? famotidine (PEPCID) 20 MG tablet Take 1 tablet (20 mg total) by mouth Two (2) times a day. 180 tablet 2   ??? fluoride, sodium, 1.1 % Pste      ??? fluticasone propionate (FLONASE) 50 mcg/actuation nasal spray 1 spray into each nostril Two (2) times a day. 16 g 5   ??? glipiZIDE (GLUCOTROL) 10 MG tablet TAKE TWO TABLETS BY MOUTH TWICE A DAY BEFORE A MEAL 120 tablet 1   ??? HUMALOG U-100 INSULIN 100 unit/mL injection INJECT 120 UNITS UNDER THE SKIN DAILY VIA OMNIPOD 40 mL 5   ??? hydroCHLOROthiazide (HYDRODIURIL) 25 MG tablet Take 1 tablet (25 mg total) by mouth daily. 90 tablet 3   ??? hydrocortisone (PROCTOSOL HC) 2.5 % rectal cream Insert 1 application into the rectum two (2) times a day as needed. 28.35 g 2   ??? hydrOXYzine (ATARAX) 25 MG tablet Take 1 tablet (25 mg total) by mouth Three (3) times a day as needed. 120 tablet 1   ??? ibuprofen (ADVIL,MOTRIN) 800 MG tablet Take 1 tablet (800 mg total) by mouth every eight (8) hours as needed. 90 tablet 1   ??? insulin pump cartridge Crtg Inject 120 Units under the skin daily. (Patient taking differently: Inject 200 Units under the skin. About every 2-3 days) 5 each 5   ??? insulin syringe-needle U-100 1 mL 31 gauge x 5/16 (8 mm) Syrg insulin syringe U-100 with needle 1 mL 31 gauge x 5/16   USE UP TO 5 TIMES A DAY TO ADMINISTER INSULIN     ??? ketoconazole (NIZORAL) 2 % cream Apply 1 application topically daily. To the affected area of the groin 60 g 11   ??? lancets (ACCU-CHEK FASTCLIX LANCET DRUM) Misc Use to check blood sugars 3 times a day before meals or as directed 200 each 5   ??? levothyroxine (SYNTHROID) 50 MCG tablet Take 1 tablet (50 mcg total) by mouth daily. 90 tablet 1   ??? lidocaine (XYLOCAINE) 5 % ointment lidocaine 5 % topical ointment     ??? montelukast (SINGULAIR) 10 mg tablet Take 1 tablet (10 mg total) by mouth daily. 90 tablet 3   ??? multivitamin (MULTIPLE VITAMIN ORAL) Take 1 tablet by mouth daily.     ??? ondansetron (ZOFRAN-ODT) 4 MG disintegrating tablet Take 1 tablet (4 mg total)  by mouth every eight (8) hours as needed for nausea for up to 15 doses. 15 tablet 1   ??? oxybutynin (DITROPAN) 5 MG tablet TAKE 1 TABLET BY MOUTH TWICE A DAY 60 tablet 1   ??? pantoprazole (PROTONIX) 40 MG tablet TAKE ONE TABLET BY MOUTH TWICE A DAY 60 tablet 1   ??? polyethylene glycol (GLYCOLAX) 17 gram/dose powder take 17GM (DISSOLVED IN WATER) by mouth once daily     ??? promethazine (PHENERGAN) 25 MG tablet Take 1-2 tablets (25-50 mg total) by mouth nightly as needed for nausea. 30 tablet 3   ??? propranoloL (INDERAL LA) 120 mg 24 hr capsule Take 1 capsule (120 mg total) by mouth in the morning. 90 capsule 0   ??? ramelteon (ROZEREM) 8 mg tablet Take 8 mg by mouth nightly.     ??? sour cherry extract (TART CHERRY EXTRACT) 1,000 mg cap      ??? topiramate (TOPAMAX) 50 MG tablet Take 1.5 tablets (75 mg total) by mouth Two (2) times a day. 270 tablet 1   ??? hydroCHLOROthiazide (HYDRODIURIL) 12.5 MG tablet TAKE 1 TABLET BY MOUTH DAILY 90 tablet 0   ??? traZODone (DESYREL) 100 MG tablet Take 2 tablets (200 mg total) by mouth nightly. 180 tablet 0     No current facility-administered medications for this visit.       Health Maintenance:     Health Maintenance   Topic Date Due   ??? COVID-19 Vaccine (3 - Pfizer risk series) 01/24/2021   ??? Retinal Eye Exam  09/20/2021   ??? Hemoglobin A1c  10/10/2021   ??? Urine Albumin/Creatinine Ratio  10/11/2021   ??? Foot Exam  01/09/2022   ??? Serum Creatinine Monitoring  01/18/2022   ??? Potassium Monitoring  01/18/2022   ??? DTaP/Tdap/Td Vaccines (4 - Td or Tdap) 07/17/2023   ??? COPD Spirometry  05/25/2025   ??? Pneumococcal Vaccine 0-64  Completed   ??? Hepatitis C Screen  Completed   ??? Influenza Vaccine  Completed       Immunizations:     Immunization History   Administered Date(s) Administered   ??? COVID-19 VAC,MRNA,TRIS(12Y UP)(PFIZER)(GRAY CAP) 12/04/2020, 12/27/2020   ??? COVID-19 VACC,MRNA,(PFIZER)(PF) 12/04/2020, 12/27/2020   ??? Hepatitis B Vaccine, Unspecified Formulation 12/27/1999, 04/29/2000   ??? Hepatitis B, Adult 03/05/2013, 04/05/2013, 09/03/2013   ??? INFLUENZA INJ MDCK PF, QUAD,(FLUCELVAX)(20MO AND UP EGG FREE) 03/31/2019   ??? INFLUENZA TIV (TRI) PF (IM) 10/08/2011   ??? Influenza Vaccine Quad (IIV4 PF) 55mo+ injectable 03/05/2013, 03/18/2014, 03/22/2015, 03/05/2016, 03/19/2017, 02/23/2018   ??? Influenza Virus Vaccine, unspecified formulation 03/19/2017, 02/23/2018, 03/25/2019, 03/10/2020, 04/05/2021   ??? PNEUMOCOCCAL POLYSACCHARIDE 23 12/30/2012   ??? PPD Test 10/28/2016   ??? TdaP 07/18/2008, 07/16/2013   ??? Tetanus-Diptheria Toxoids-TD(TDVAX),Asdorbed,2LF(IM) 04/29/2000     I have reviewed and (if needed) updated the patient's problem list, medications, allergies, past medical and surgical history, social and family history.     Vital Signs:     Wt Readings from Last 3 Encounters:   07/13/21 (!) 226.8 kg (500 lb)   04/12/21 (!) 223.2 kg (492 lb)   02/05/21 (!) 224.1 kg (494 lb)     Temp Readings from Last 3 Encounters:   07/13/21 37.1 ??C (98.7 ??F) (Oral)   04/12/21 36.7 ??C (98 ??F) (Oral)   02/05/21 36.7 ??C (98 ??F) (Oral)     BP Readings from Last 3 Encounters:   07/13/21 148/84   04/12/21 142/82   02/05/21 136/80     Pulse  Readings from Last 3 Encounters:   07/13/21 73   04/12/21 68   02/05/21 90     Estimated body mass index is 69.74 kg/m?? as calculated from the following:    Height as of this encounter: 180.3 cm (5' 11).    Weight as of this encounter: 226.8 kg (500 lb).  Facility age limit for growth percentiles is 20 years.      Objective:      General: Alert and oriented x3. Well-appearing. No acute distress. Morbidly obese.   HEENT:  Normocephalic.  Atraumatic. Conjunctiva and sclera normal. OP MMM without lesions.   Neck:  Supple. No thyroid enlargement. No adenopathy.   Heart:  Regular rate and rhythm. Normal S1, S2. No murmurs, rubs or gallops.   Lungs:  No respiratory distress.  Lungs clear to auscultation. No wheezes, rhonchi, or rales.   GI/GU:  Soft, +BS, nondistended, non-TTP. No palpable masses or organomegaly.   Extremities:  Lymphedema bilateral LE. Peripheral pulses normal.   Skin:  Warm, dry. No rash or lesions present.   Neuro:  Non-focal. No obvious weakness.   Psych:  Affect normal, eye contact good, speech clear and coherent.        ??  [name], FNP

## 2021-07-18 DIAGNOSIS — Z794 Long term (current) use of insulin: Principal | ICD-10-CM

## 2021-07-18 DIAGNOSIS — E1165 Type 2 diabetes mellitus with hyperglycemia: Principal | ICD-10-CM

## 2021-07-18 MED ORDER — TRULICITY 1.5 MG/0.5 ML SUBCUTANEOUS PEN INJECTOR
SUBCUTANEOUS | 5 refills | 28 days | Status: CP
Start: 2021-07-18 — End: 2022-07-18

## 2021-07-18 NOTE — Unmapped (Signed)
Patient is requesting the following refill  Requested Prescriptions     Pending Prescriptions Disp Refills   ??? TRULICITY 1.5 mg/0.5 mL PnIj [Pharmacy Med Name: TRULICITY 1.5 MG/0.5ML SUBQ SOLN ML] 2 mL 2     Sig: INJECT 0.5 ML (1.5 MG TOTAL) UNDER THE SKIN EVERY 7 DAYS       Recent Visits  Date Type Provider Dept   07/13/21 Office Visit [name] Rosita Fire, FNP [facility]   04/12/21 Office Visit [name] Rosita Fire, FNP [facility]   02/05/21 Office Visit [name] Rosita Fire, FNP [facility]   01/09/21 Office Visit [name] Rosita Fire, FNP [facility]   10/11/20 Office Visit [name] Rosita Fire, FNP [facility]   08/11/20 Office Visit [name] Rosita Fire, FNP [facility]   Showing recent visits within past 365 days with a meds authorizing provider and meeting all other requirements  Future Appointments  Date Type Provider Dept   10/10/21 Appointment [name] Rosita Fire, FNP [facility]   Showing future appointments within next 365 days with a meds authorizing provider and meeting all other requirements       Labs:   A1c:   Hemoglobin A1C (%)   Date Value   07/13/2021 7.3 (A)   09/15/2014 6.0

## 2021-07-19 ENCOUNTER — Other Ambulatory Visit: Payer: Self-pay

## 2021-07-19 ENCOUNTER — Ambulatory Visit (INDEPENDENT_AMBULATORY_CARE_PROVIDER_SITE_OTHER): Payer: Medicare Other | Admitting: Podiatry

## 2021-07-19 ENCOUNTER — Encounter: Payer: Self-pay | Admitting: Podiatry

## 2021-07-19 DIAGNOSIS — B351 Tinea unguium: Secondary | ICD-10-CM

## 2021-07-19 DIAGNOSIS — I890 Lymphedema, not elsewhere classified: Secondary | ICD-10-CM | POA: Diagnosis not present

## 2021-07-19 DIAGNOSIS — E1142 Type 2 diabetes mellitus with diabetic polyneuropathy: Secondary | ICD-10-CM

## 2021-07-19 DIAGNOSIS — I872 Venous insufficiency (chronic) (peripheral): Secondary | ICD-10-CM

## 2021-07-19 DIAGNOSIS — M79676 Pain in unspecified toe(s): Secondary | ICD-10-CM

## 2021-07-19 NOTE — Progress Notes (Signed)
This patient returns to my office for at risk foot care.  This patient requires this care by a professional since this patient will be at risk due to having diabetes with no complications.  This patient is unable to cut nails himself since the patient cannot reach his nails.These nails are painful walking and wearing shoes.  This patient presents for at risk foot care today.  General Appearance  Alert, conversant and in no acute stress.  Vascular  Dorsalis pedis and posterior tibial  pulses are palpable  bilaterally.  Capillary return is within normal limits  bilaterally. Temperature is within normal limits  bilaterally.  Neurologic  Senn-Weinstein monofilament wire test within normal limits  bilaterally. Muscle power within normal limits bilaterally.  Nails Thick disfigured discolored nails with subungual debris  from hallux to fifth toes bilaterally. No evidence of bacterial infection or drainage bilaterally.  Orthopedic  No limitations of motion  feet .  No crepitus or effusions noted.  No bony pathology or digital deformities noted.  Skin  normotropic skin with no porokeratosis noted bilaterally.  No signs of infections or ulcers noted.     Onychomycosis  Pain in right toes  Pain in left toes  Consent was obtained for treatment procedures.   Mechanical debridement of nails 1-5  bilaterally performed with a nail nipper.  Filed with dremel without incident.    Return office visit   4 months                   Told patient to return for periodic foot care and evaluation due to potential at risk complications.   Larayah Clute DPM  

## 2021-07-23 ENCOUNTER — Ambulatory Visit (INDEPENDENT_AMBULATORY_CARE_PROVIDER_SITE_OTHER): Payer: Medicare Other | Admitting: Nurse Practitioner

## 2021-07-23 ENCOUNTER — Encounter (INDEPENDENT_AMBULATORY_CARE_PROVIDER_SITE_OTHER): Payer: Self-pay | Admitting: Nurse Practitioner

## 2021-07-23 ENCOUNTER — Other Ambulatory Visit: Payer: Self-pay

## 2021-07-23 VITALS — BP 169/96 | HR 90 | Resp 16 | Wt >= 6400 oz

## 2021-07-23 DIAGNOSIS — I890 Lymphedema, not elsewhere classified: Secondary | ICD-10-CM

## 2021-07-23 NOTE — Progress Notes (Signed)
History of Present Illness  There is no documented history at this time  Assessments & Plan   There are no diagnoses linked to this encounter.    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    Procedure:  3 layer unna wrap was placed Right lower extremity.   Plan:   Follow up in one week.   

## 2021-07-23 NOTE — Unmapped (Signed)
Therapy Update Follow Up: Prior Authorization Approved - Copay = $0

## 2021-07-25 NOTE — Unmapped (Signed)
[facility] Refill Coordination Note    Specialty Medication(s) to be Shipped:   {specpharm:59087}    Other medication(s) to be shipped: {Blank:19197::No additional medications requested for fill at this time}     Erline Levine, DOB: 1981-06-06  Phone: [phone] (home)       All above HIPAA information was verified with {Blank:19197::patient.,patient's caregiver, ***,patient's family member, ***.}     Was a Nurse, learning disability used for this call? {Blank single:19197::Yes, ***. Patient language is appropriate in WAM,No}    Completed refill call assessment today to schedule patient's medication shipment from the [facility] [phone]).  All relevant notes have been reviewed.     Specialty medication(s) and dose(s) confirmed: {Blank:19197::Regimen is correct and unchanged.,Patient reports changes to the regimen as follows: ***}   Changes to medications: {Blank:19197::Joeseph reports starting the following medications: ***,Kimsey reports stopping the following medications: ***,Konrad reports no changes at this time.}  Changes to insurance: {Blank:19197::Yes: ***,No}  New side effects reported not previously addressed with a pharmacist or physician: {sscrefillsideeffects:78475}  Questions for the pharmacist: {Blank:19197::Yes: ***,No}    Confirmed patient received a Conservation officer, historic buildings and a Surveyor, mining with first shipment. The patient will receive a drug information handout for each medication shipped and additional FDA Medication Guides as required.       DISEASE/MEDICATION-SPECIFIC INFORMATION        {clinicspecificinstructions:59274}    SPECIALTY MEDICATION ADHERENCE              Were doses missed due to medication being on hold? {Blank:19197::No,Yes - ***}    *** *** {Blank:19197::mg,mg/ml,***}: *** days of medicine on hand   *** *** {Blank:19197::mg,mg/ml,***}: *** days of medicine on hand   *** *** {Blank:19197::mg,mg/ml,***}: *** days of medicine on hand   *** *** {Blank:19197::mg,mg/ml,***}: *** days of medicine on hand   *** *** {Blank:19197::mg,mg/ml,***}: *** days of medicine on hand     REFERRAL TO PHARMACIST     Referral to the pharmacist: {SSCRefertoRPH:77899}      SHIPPING     Shipping address confirmed in Epic.     Delivery Scheduled: {Blank:19197::Yes, Expected medication delivery date: ***.,Yes, Expected medication delivery date: ***.  However, Rx request for refills was sent to the provider as there are none remaining.,Patient declined refill at this time due to ***.,No, cannot schedule delivery at this time as there are outstanding items that need addressed.  This note has been handed off to the provider for follow up.,Due to patient insurance changes, unable to fill at [facility], please route Rx to *** specialty pharmacy}     Medication will be delivered via {Blank:19197::UPS,Next Day Courier,Same Day Courier,Clinic Courier - *** clinic,***} to the {Blank:19197::prescription,temporary} address in Epic WAM.    Tawanna [facility] Specialty {Blank:19197::Pharmacist,Technician} UNITS UNDER THE SKIN DAILY VIA OMNIPOD 40 mL 5   ??? hydroCHLOROthiazide (HYDRODIURIL) 25 MG tablet Take 1 tablet (25 mg total) by mouth daily. 90 tablet 3   ??? hydrocortisone (PROCTOSOL HC) 2.5 % rectal cream Insert 1 application into the rectum two (2) times a day as needed. 28.35 g 2   ??? hydrOXYzine (ATARAX) 25 MG tablet Take 1 tablet (25 mg total) by mouth Three (3) times a day as needed. 120 tablet 1   ??? ibuprofen (ADVIL,MOTRIN) 800 MG tablet Take 1 tablet (800 mg total) by mouth every eight (8) hours as needed. 90 tablet 1   ??? insulin pump cartridge Crtg Inject  120 Units under the skin daily. (Patient taking differently: Inject 200 Units under the skin. About every 2-3 days) 5 each 5   ??? insulin syringe-needle U-100 1 mL 31 gauge x 5/16 (8 mm) Syrg insulin syringe U-100 with needle 1 mL 31 gauge x 5/16   USE UP TO 5 TIMES A DAY TO ADMINISTER INSULIN     ??? ketoconazole (NIZORAL) 2 % cream Apply 1 application topically daily. To the affected area of the groin 60 g 11   ??? lancets (ACCU-CHEK FASTCLIX LANCET DRUM) Misc Use to check blood sugars 3 times a day before meals or as directed 200 each 5   ??? levothyroxine (SYNTHROID) 50 MCG tablet Take 1 tablet (50 mcg total) by mouth daily. 90 tablet 1   ??? lidocaine (XYLOCAINE) 5 % ointment lidocaine 5 % topical ointment     ??? montelukast (SINGULAIR) 10 mg tablet Take 1 tablet (10 mg total) by mouth daily. 90 tablet 3   ??? multivitamin (MULTIPLE VITAMIN ORAL) Take 1 tablet by mouth daily.     ??? ondansetron (ZOFRAN-ODT) 4 MG disintegrating tablet Take 1 tablet (4 mg total) by mouth every eight (8) hours as needed for nausea for up to 15 doses. 15 tablet 1   ??? oxybutynin (DITROPAN) 5 MG tablet TAKE 1 TABLET BY MOUTH TWICE A DAY 60 tablet 1   ??? pantoprazole (PROTONIX) 40 MG tablet TAKE ONE TABLET BY MOUTH TWICE A DAY 60 tablet 1   ??? polyethylene glycol (GLYCOLAX) 17 gram/dose powder take 17GM (DISSOLVED IN WATER) by mouth once daily     ??? promethazine (PHENERGAN) 25 MG tablet Take 1-2 tablets (25-50 mg total) by mouth nightly as needed for nausea. 30 tablet 3   ??? propranoloL (INDERAL LA) 120 mg 24 hr capsule Take 1 capsule (120 mg total) by mouth in the morning. 90 capsule 0   ??? ramelteon (ROZEREM) 8 mg tablet Take 8 mg by mouth nightly.     ??? sour cherry extract (TART CHERRY EXTRACT) 1,000 mg cap      ??? topiramate (TOPAMAX) 50 MG tablet Take 1.5 tablets (75 mg total) by mouth Two (2) times a day. 270 tablet 1     No current facility-administered medications for this visit.        Changes to medications: Yoshiaki reports no changes at this time.    Allergies   Allergen Reactions   ??? Erythromycin Nausea And Vomiting     Other reaction(s): Vomiting   ??? Penicillins Rash, Swelling and Hives   ??? Sulfa (Sulfonamide Antibiotics) Nausea And Vomiting and Nausea Only   ??? Other        Changes to allergies: No    SPECIALTY MEDICATION ADHERENCE     Humira - 2 x 40 mg pens left (1 dose)  Medication Adherence    Patient reported X missed doses in the last month: all  Specialty Medication: Humira          Specialty medication(s) dose(s) confirmed: Regimen is correct and unchanged.     Are there any concerns with adherence? No    Adherence counseling provided? Not needed    CLINICAL MANAGEMENT AND INTERVENTION      Clinical Benefit Assessment:    Do you feel the medicine is effective or helping your condition? Yes    Clinical Benefit counseling provided? Not needed    Adverse Effects Assessment:    Are you experiencing any side effects? No    Are you experiencing difficulty  administering your medicine? No    Quality of Life Assessment:    Quality of Life    Rheumatology  Oncology  Dermatology  1. What impact has your specialty medication had on the symptoms of your skin condition (i.e. itchiness, soreness, stinging)?: Some  2. What impact has your specialty medication had on your comfort level with your skin?: Some  Cystic Fibrosis          Have you discussed this with your provider? Not needed    Acute Infection Status:    Acute infections noted within Epic:  No active infections  Patient reported infection: None    Therapy Appropriateness:    Is therapy appropriate and patient progressing towards therapeutic goals? Yes, therapy is appropriate and should be continued    DISEASE/MEDICATION-SPECIFIC INFORMATION      For patients on injectable medications: Patient currently has 1 doses left.  Next injection is scheduled for tbd - patient indicated he may start back 2/16.    PATIENT SPECIFIC NEEDS     - Does the patient have any physical, cognitive, or cultural barriers? No    - Is the patient high risk? No    Does the patient require a Care Management Plan? No      SHIPPING     Specialty Medication(s) to be Shipped:   Inflammatory Disorders: Humira    Other medication(s) to be shipped: sharps kit     Changes to insurance: No    Delivery Scheduled: Yes, Expected medication delivery date: Friday, 2/17.     Medication will be delivered via Same Day Courier to the confirmed prescription address in [facility].    The patient will receive a drug information handout for each medication shipped and additional FDA Medication Guides as required.  Verified that patient has previously received a Conservation officer, historic buildings and a Surveyor, mining.    The patient or caregiver noted above participated in the development of this care plan and knows that they can request review of or adjustments to the care plan at any time.      All of the patient's questions and concerns have been addressed.    Lanney Gins   [facility] Specialty Pharmacist

## 2021-07-27 MED FILL — EMPTY CONTAINER: 120 days supply | Qty: 1 | Fill #1

## 2021-07-29 ENCOUNTER — Encounter (INDEPENDENT_AMBULATORY_CARE_PROVIDER_SITE_OTHER): Payer: Self-pay | Admitting: Nurse Practitioner

## 2021-07-30 ENCOUNTER — Other Ambulatory Visit: Payer: Self-pay

## 2021-07-30 ENCOUNTER — Ambulatory Visit (INDEPENDENT_AMBULATORY_CARE_PROVIDER_SITE_OTHER): Payer: Medicare Other | Admitting: Nurse Practitioner

## 2021-07-30 VITALS — BP 168/97 | HR 94 | Resp 16 | Wt >= 6400 oz

## 2021-07-30 DIAGNOSIS — I890 Lymphedema, not elsewhere classified: Secondary | ICD-10-CM | POA: Diagnosis not present

## 2021-08-01 DIAGNOSIS — J4550 Severe persistent asthma, uncomplicated: Principal | ICD-10-CM

## 2021-08-06 ENCOUNTER — Encounter (INDEPENDENT_AMBULATORY_CARE_PROVIDER_SITE_OTHER): Payer: Medicare Other

## 2021-08-06 ENCOUNTER — Encounter (INDEPENDENT_AMBULATORY_CARE_PROVIDER_SITE_OTHER): Payer: Self-pay | Admitting: Nurse Practitioner

## 2021-08-06 ENCOUNTER — Ambulatory Visit: Admit: 2021-08-06 | Discharge: 2021-08-06 | Payer: MEDICARE

## 2021-08-06 DIAGNOSIS — J4530 Mild persistent asthma, uncomplicated: Principal | ICD-10-CM

## 2021-08-06 DIAGNOSIS — J410 Simple chronic bronchitis: Principal | ICD-10-CM

## 2021-08-06 DIAGNOSIS — G4733 Obstructive sleep apnea (adult) (pediatric): Principal | ICD-10-CM

## 2021-08-06 DIAGNOSIS — E669 Obesity, unspecified: Principal | ICD-10-CM

## 2021-08-06 MED ORDER — BUDESONIDE-FORMOTEROL HFA 80 MCG-4.5 MCG/ACTUATION AEROSOL INHALER
Freq: Two times a day (BID) | RESPIRATORY_TRACT | 11 refills | 31 days | Status: CP
Start: 2021-08-06 — End: ?

## 2021-08-06 NOTE — Progress Notes (Signed)
History of Present Illness  There is no documented history at this time  Assessments & Plan   There are no diagnoses linked to this encounter.    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    Procedure:  3 layer unna wrap was placed Right lower extremity.   Plan:   Follow up in one week.   

## 2021-08-06 NOTE — Unmapped (Signed)
Pulmonary Clinic - Initial Visit    Referring Physician :  Loran Senters  PCP:     [name], FNP  Reason for Consult:   Chronic bronchitis      ASSESSMENT and PLAN     Logan Quinn is a 41 y.o. male with severe OSA, GERD, NAFLD, DM2, Hidradenitis Suppurativa on Humira, depression w hx suicidal ideation, obesity (BMI 70) presenting for chronic bronchitis.    Mild persistent asthma: Bronchitis episodes variable responsive to albuterol and prednisone. Eos as high as 300 but 100 lately. VBG 2021 7.39/43. Struggles with Advair especially when sick. Cough a main symptom. PFTs suggestive of reversibility but no fixed obstruction. On Humira for HS, could consider atypical/indolent infx too.   - SWITCH Advair to Symbicort  - Continue montelukast  - Continue GERD Rx  - CXR today  - Suspect viral illnesses from young children + some untreated asthma. If worsening and persistent (episdoic currently) then would check ct chest    OSA:  - CPAP broken, encouraged him to get it fixed with DME company.    Obesity:  - Continue Trulicity    Diagnoses and all orders for this visit:    Mild persistent asthma without complication  -     XR Chest 2 views; Future    Simple chronic bronchitis (CMS-HCC)  -     Ambulatory referral to Pulmonology    Other orders  -     budesonide-formoteroL (SYMBICORT) 80-4.5 mcg/actuation inhaler; Inhale 2 puffs Two (2) times a day.        Plan of care was discussed with the patient who acknowledged understanding and is in agreement.    Patient will return to clinic in 3 months or sooner if needed.    This patient was seen and discussed with attending physician, Dr. Lisabeth Devoid who agrees with the assessment and plan above.     ZO:[id number] Rosita Fire, [name], FNP    Burman Riis Laruth Hanger MD  Pulmonary and Critical Care Fellow  08/06/21   ~~~~~    HISTORY:     History of Present Illness:  Logan Quinn is a 41 y.o. male with a history of the above whom we are seeing in consultation requested by Loran Senters for evaluation of chronic bronchitis.    Here for chronic bronchitis he gets 4-6x/year.    Has been taking Advair discus and albuterol and wants to know what more can be done.    First time he had bronchitis was around age 15. It seems to become more prevalent since then. At first there was a seaonality, like when the seasons change. Winter times are still worse, but it seems more indiscriminanty these days. No odor/perfume sensitivity. Cooler temperatures make it worse. A typical course is a hacking coughing, a little phlegm in the beginning, then just irritating non-productive cough, chest tightness, phlegm is often sticky/thick and green. Usually it just has to run its course, usually drinks hot liquids and uses Vicks vaporub. Albuterol gives temporary relief. In the midst of an attack he doesn't feel the medicine is getting in (Advair). Prednisone and abx don't consistently help. Last episode was Dec-Jan.     No nighttime cough. CPAP machine broken so not using. Will see sleep clinic soon amd is working with [facility] to get a loaner    Prior to 30's no other lung dz. No tobacco or vape or MJ. No inh exposures. Currently disabled and cares  for his kids. Pets at home: 3 dogs. Has had dogs since 2008 and most of his life. No birds.     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 They are 2 floors apart, and they have been in the building for 3 years only on Tuesday nights.     Born and raised in [place]. Dad's mom had COPD. Mom has COPD. Both smoke.     Has GERD and has seen GI and ENT who put him on 2x PPI and H2.     Has hidradenitis suppurativa treated with Humira. Started ~ 2020    Past Medical History:  Past Medical History:   Diagnosis Date   ??? Acne    ??? Acquired hypothyroidism 01/09/2021   ??? Allergic    ??? Anxiety    ??? Depression    ??? Diabetes mellitus (CMS-HCC) Dx 2013    Type II   ??? GERD (gastroesophageal reflux disease)    ??? Gout    ??? Hypertension    ??? IBS (irritable bowel syndrome)    ??? 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 Liver disease    ??? Migraines    ??? Morbid obesity with BMI of 60.0-69.9, adult (CMS-HCC)    ??? 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 Obstructive sleep apnea    ??? OSA on CPAP    ??? Severe obstructive sleep apnea    ??? Trapezius muscle strain 12/07/2013   ??? Urinary incontinence, nocturnal enuresis    ??? Venous insufficiency      Past Surgical History:   Procedure Laterality Date   ??? EYE SURGERY  11/15   ??? PR COLONOSCOPY FLX DX W/COLLJ SPEC WHEN PFRMD N/A 03/24/2013    Procedure: COLONOSCOPY, FLEXIBLE, PROXIMAL TO SPLENIC FLEXURE; DIAGNOSTIC, W/WO COLLECTION SPECIMEN BY BRUSH OR WASH;  Surgeon: Clint Bolder, MD;  [facility];  Service: Gastroenterology   ??? PR EYE SURG POST SGMT PROC UNLISTED Left     pneumatic retinopexy OS   ??? PR UPPER GI ENDOSCOPY,DIAGNOSIS N/A 02/02/2013    Procedure: UGI ENDO, INCLUDE ESOPHAGUS, STOMACH, & DUODENUM &/OR JEJUNUM; DX W/WO COLLECTION SPECIMN, BY BRUSH OR WASH;  Surgeon: Malcolm Metro, MD;  Location: [facility];  Service: Gastroenterology   ??? 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    Family History   Problem Relation Age of Onset   ??? Cancer Maternal Grandfather    ??? Hearing loss Maternal Grandfather    ??? Cancer Paternal Grandfather    ??? COPD Paternal Grandmother    ??? Arthritis Paternal Grandmother    ??? Depression Paternal Grandmother    ??? Diabetes Mother    ??? Heart disease Mother    ??? Migraines Mother    ??? Arthritis Mother    ??? Depression Mother    ??? GER disease Mother    ??? Hypertension Mother    ??? Angina Mother    ??? COPD Mother    ??? Glaucoma Mother    ??? Hearing loss Mother    ??? Diabetes Sister    ??? Migraines Sister    ??? Asthma Sister    ??? Depression Sister    ??? Hearing loss Sister    ??? Diabetes Brother    ??? Asthma Brother    ???  Diabetes Maternal Grandmother    ??? Heart disease Maternal Grandmother    ??? Migraines Maternal Grandmother    ??? Depression Maternal Grandmother    ??? Angina Maternal Grandmother    ??? Hypertension Maternal Grandmother    ??? Stroke Maternal Grandmother    ??? Diabetes Maternal Uncle    ??? Hearing loss Maternal Uncle    ??? Diabetes Maternal Uncle         resulted in need for kidney transplant   ??? Liver disease Maternal Uncle    ??? Kidney disease Maternal Uncle         needed kidney transplant   ??? Asthma Brother    ??? No Known Problems Father    ??? No Known Problems Paternal Aunt    ??? No Known Problems Paternal Uncle    ??? No Known Problems Other    ??? Colorectal Cancer Neg Hx    ??? Esophageal cancer Neg Hx    ??? Liver cancer Neg Hx    ??? Pancreatic cancer Neg Hx    ??? Stomach cancer Neg Hx    ??? Amblyopia Neg Hx    ??? Blindness Neg Hx    ??? Retinal detachment Neg Hx    ??? Strabismus Neg Hx    ??? Macular degeneration Neg Hx    ??? Anesthesia problems Neg Hx    ??? Broken bones Neg Hx    ??? Clotting disorder Neg Hx    ??? Collagen disease Neg Hx    ??? Dislocations Neg Hx    ??? Fibromyalgia Neg Hx    ??? Gout Neg Hx    ??? Hemophilia Neg Hx    ??? Osteoporosis Neg Hx    ??? Rheumatologic disease Neg Hx    ??? Scoliosis Neg Hx    ??? Severe sprains Neg Hx    ??? Sickle cell anemia Neg Hx    ??? Spinal Compression Fracture Neg Hx    ??? Melanoma Neg Hx    ??? Basal cell carcinoma Neg Hx    ??? Squamous cell carcinoma Neg Hx    ??? Deep vein thrombosis Neg Hx    ??? Cataracts Neg Hx    ??? Thyroid disease Neg Hx      Social History     Socioeconomic History   ??? Marital status: Married   Tobacco Use   ??? Smoking status: Never   ??? Smokeless tobacco: Never   Vaping Use   ??? Vaping Use: Never used   Substance and Sexual Activity   ??? Alcohol use: Not Currently     Comment: rare social   ??? Drug use: Never   ??? Sexual activity: Yes     Partners: Female     Birth control/protection: None   Other Topics Concern   ??? Do you use sunscreen? Yes   ??? Tanning bed use? No   ??? Are you easily burned? No   ??? Excessive sun exposure? No   ??? Blistering sunburns? No     Social Determinants of Health     Financial Resource Strain: Low Risk    ??? Difficulty of Paying Living Expenses: Not very hard   Food Insecurity: No Food Insecurity   ??? Worried About Running Out of Food in the Last Year: Never true   ??? Ran Out of Food in the Last Year: Never true   Transportation Needs: No Transportation Needs   ??? Lack of Transportation (Medical): No   ??? Lack of Transportation (Non-Medical): No  Home Medications:  Current Outpatient Medications on File Prior to Visit   Medication Sig Dispense Refill   ??? acetaminophen (TYLENOL) 500 MG tablet Take 2 tablets (1,000 mg total) by mouth Three (3) times a day. (Patient taking differently: Take 500 mg by mouth Three (3) times a day.) 30 tablet 0   ??? adalimumab (HUMIRA,CF, PEN) 80 mg/0.8 mL PnKt Inject the contents of 1 pen (80mg ) under the skin once a week. 4 each 11   ??? albuterol 2.5 mg /3 mL (0.083 %) nebulizer solution Inhale 3 mL (2.5 mg total) by nebulization every four (4) hours as needed for wheezing. 180 mL 2   ??? albuterol HFA 90 mcg/actuation inhaler Inhale 2 puffs every six (6) hours as needed for wheezing. 8 g 5   ??? ALPRAZolam (XANAX) 0.5 MG tablet Take 1 tablet (0.5 mg total) by mouth Three (3) times a day. 90 tablet 1   ??? ascorbic acid (VITAMIN C ORAL) Take 1 capsule by mouth nightly.     ??? blood sugar diagnostic (ACCU-CHEK GUIDE TEST STRIPS) Strp by Other route Three (3) times a day before meals. 300 strip 1   ??? blood sugar diagnostic (ACCU-CHEK GUIDE TEST STRIPS) Strp Accu-Chek Guide test strips   USE TO CHECK BLOOD SUGAR 3 TIMES DAILY BEFORE MEALS     ??? clindamycin (CLEOCIN T) 1 % external solution clindamycin phosphate 1 % topical solution   APPLY TOPICALLY TO GROIN AREA TWICE A DAY     ??? clindamycin (CLEOCIN T) 1 % lotion APPLY TOPICALLY AT NIGHT TO INFLAMED AREAS AS NEEDED FOR FLARES     ??? clobetasoL (TEMOVATE) 0.05 % ointment Apply daily to painful affected areas if needed for flares, then stop 15 g 1   ??? colchicine (MITIGARE) 0.6 mg cap capsule TAKE 2 CAPSULES BY MOUTH ON DAY ONE, FOLLOWED BY ONE CAPSULE ONE HOUR LATER, ON DAY 2 TAKE ONE CAPSULE BY MOUTH TWICEA DAY UNTIL RESOLVED 60 capsule 0   ??? cyclobenzaprine (FLEXERIL) 10 MG tablet Take 1 tablet (10 mg total) by mouth Three (3) times a day as needed for muscle spasms. 60 tablet 1   ??? dextroamphetamine-amphetamine (ADDERALL XR) 20 MG 24 hr capsule Take 1 capsule by mouth every morning.     ??? dicyclomine (BENTYL) 10 mg capsule Take 1 capsule (10 mg total) by mouth Three (3) times a day. 270 capsule 1   ??? diphenoxylate-atropine (LOMOTIL) 2.5-0.025 mg per tablet Take 1 tablet by mouth two (2) times a day as needed for diarrhea. 30 tablet 5   ??? dulaglutide (TRULICITY) 1.5 mg/0.5 mL PnIj Inject 0.5 mL (1.5 mg total) under the skin every seven (7) days. 2 mL 5   ??? DULoxetine (CYMBALTA) 60 MG capsule Take 1 capsule (60 mg total) by mouth Two (2) times a day. 180 capsule 0   ??? empty container (SHARPS CONTAINER) Misc Use as directed to dispose of Humira needles 1 each 2   ??? empty container Misc Use as directed 1 each 2   ??? erenumab-aooe (AIMOVIG AUTOINJECTOR) 140 mg/mL AtIn Inject 140 mg under the skin every thirty (30) days. 3 mL 1   ??? fluoride, sodium, 1.1 % Pste      ??? fluticasone propionate (FLONASE) 50 mcg/actuation nasal spray 1 spray into each nostril Two (2) times a day. 16 g 5   ??? glipiZIDE (GLUCOTROL) 10 MG tablet TAKE TWO TABLETS BY MOUTH TWICE A DAY BEFORE A MEAL 120 tablet 1   ??? HUMALOG  U-100 INSULIN 100 unit/mL injection INJECT 120 UNITS UNDER THE SKIN DAILY VIA OMNIPOD 40 mL 5   ??? hydroCHLOROthiazide (HYDRODIURIL) 25 MG tablet Take 1 tablet (25 mg total) by mouth daily. 90 tablet 3   ??? hydrocortisone (PROCTOSOL HC) 2.5 % rectal cream Insert 1 application into the rectum two (2) times a day as needed. 28.35 g 2   ??? hydrOXYzine (ATARAX) 25 MG tablet Take 1 tablet (25 mg total) by mouth Three (3) times a day as needed. 120 tablet 1   ??? ibuprofen (ADVIL,MOTRIN) 800 MG tablet Take 1 tablet (800 mg total) by mouth every eight (8) hours as needed. 90 tablet 1   ??? insulin pump cartridge Crtg Inject 120 Units under the skin daily. (Patient taking differently: Inject 200 Units under the skin. About every 2-3 days) 5 each 5   ??? insulin syringe-needle U-100 1 mL 31 gauge x 5/16 (8 mm) Syrg insulin syringe U-100 with needle 1 mL 31 gauge x 5/16   USE UP TO 5 TIMES A DAY TO ADMINISTER INSULIN     ??? ketoconazole (NIZORAL) 2 % cream Apply 1 application topically daily. To the affected area of the groin 60 g 11   ??? lancets (ACCU-CHEK FASTCLIX LANCET DRUM) Misc Use to check blood sugars 3 times a day before meals or as directed 200 each 5   ??? levothyroxine (SYNTHROID) 50 MCG tablet Take 1 tablet (50 mcg total) by mouth daily. 90 tablet 1   ??? lidocaine (XYLOCAINE) 5 % ointment lidocaine 5 % topical ointment     ??? montelukast (SINGULAIR) 10 mg tablet Take 1 tablet (10 mg total) by mouth daily. 90 tablet 3   ??? multivitamin (MULTIPLE VITAMIN ORAL) Take 1 tablet by mouth daily.     ??? ondansetron (ZOFRAN-ODT) 4 MG disintegrating tablet Take 1 tablet (4 mg total) by mouth every eight (8) hours as needed for nausea for up to 15 doses. 15 tablet 1   ??? oxybutynin (DITROPAN) 5 MG tablet TAKE 1 TABLET BY MOUTH TWICE A DAY 60 tablet 1   ??? pantoprazole (PROTONIX) 40 MG tablet TAKE ONE TABLET BY MOUTH TWICE A DAY 60 tablet 1   ??? polyethylene glycol (GLYCOLAX) 17 gram/dose powder take 17GM (DISSOLVED IN WATER) by mouth once daily     ??? promethazine (PHENERGAN) 25 MG tablet Take 1-2 tablets (25-50 mg total) by mouth nightly as needed for nausea. 30 tablet 3   ??? propranoloL (INDERAL LA) 120 mg 24 hr capsule Take 1 capsule (120 mg total) by mouth in the morning. 90 capsule 0   ??? ramelteon (ROZEREM) 8 mg tablet Take 8 mg by mouth nightly.     ??? sour cherry extract (TART CHERRY EXTRACT) 1,000 mg cap      ??? topiramate (TOPAMAX) 50 MG tablet Take 1.5 tablets (75 mg total) by mouth Two (2) times a day. 270 tablet 1   ??? blood-glucose meter kit Use as directed 1 each 0   ??? famotidine (PEPCID) 20 MG tablet Take 1 tablet (20 mg total) by mouth Two (2) times a day. 180 tablet 2     No current facility-administered medications on file prior to visit.       Allergies:  Allergies as of 08/06/2021 - Reviewed 08/06/2021   Allergen Reaction Noted   ??? Erythromycin Nausea And Vomiting 01/25/2015   ??? Penicillins Rash, Swelling, and Hives 12/06/2012   ??? Sulfa (sulfonamide antibiotics) Nausea And Vomiting and Nausea Only 12/30/2012   ???  Other         Review of Systems:  A comprehensive review of systems was completed and negative except as noted in HPI.    PHYSICAL EXAM:   BP 115/87  - Pulse 110  - Temp 36.7 ??C (98.1 ??F) (Temporal)  - Ht 178.1 cm (5' 10.1)  - Wt (!) 219.1 kg (483 lb)  - SpO2 97%  - BMI 69.11 kg/m??   GEN: NAD, sitting in chair, obese  EYES: EOMI, sclera anicteric  ENT: Trachea midline, MMM  CV: RRR, no murmurs appreciated  PULM: CTA B, normal WoB, no stridor  ABD: soft, non-tender, non-distended  EXT: No edema  NEURO: Grossly Non-focal, moving all extremities normally  PSYCH: A+Ox3, appropriate  MSK: no obvious joint deformities of b/l hands      LABORATORY and RADIOLOGY DATA:     Pulmonary Function Tests/Interpretation:        Pertinent Laboratory Data:    Personally reviewed in EMR    Pertinent Imaging Data:  Personally reviewed in EMR

## 2021-08-06 NOTE — Unmapped (Signed)
TO DO:  - SWITCH Advair to Symbicort s puffs twice a day with spacer  - Use Symbicort extra like a rescue inhaler.   - Call us if things get worse  - Keep an eye out for triggers, especially viral illnesses

## 2021-08-07 ENCOUNTER — Ambulatory Visit (INDEPENDENT_AMBULATORY_CARE_PROVIDER_SITE_OTHER): Payer: Medicare Other | Admitting: Nurse Practitioner

## 2021-08-07 ENCOUNTER — Other Ambulatory Visit: Payer: Self-pay

## 2021-08-07 ENCOUNTER — Encounter (INDEPENDENT_AMBULATORY_CARE_PROVIDER_SITE_OTHER): Payer: Self-pay

## 2021-08-07 ENCOUNTER — Encounter (INDEPENDENT_AMBULATORY_CARE_PROVIDER_SITE_OTHER): Payer: Self-pay | Admitting: Nurse Practitioner

## 2021-08-07 VITALS — BP 129/70 | HR 74 | Resp 16 | Wt >= 6400 oz

## 2021-08-07 DIAGNOSIS — R197 Diarrhea, unspecified: Principal | ICD-10-CM

## 2021-08-07 DIAGNOSIS — I890 Lymphedema, not elsewhere classified: Secondary | ICD-10-CM

## 2021-08-07 MED ORDER — DIPHENOXYLATE-ATROPINE 2.5 MG-0.025 MG TABLET
ORAL_TABLET | Freq: Two times a day (BID) | ORAL | 5 refills | 15 days | PRN
Start: 2021-08-07 — End: 2022-08-07

## 2021-08-07 MED ORDER — PANTOPRAZOLE 40 MG TABLET,DELAYED RELEASE
ORAL_TABLET | Freq: Two times a day (BID) | ORAL | 1 refills | 90 days | Status: CP
Start: 2021-08-07 — End: 2022-08-07

## 2021-08-07 NOTE — Progress Notes (Signed)
History of Present Illness  There is no documented history at this time  Assessments & Plan   There are no diagnoses linked to this encounter.    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    Procedure:  3 layer unna wrap was placed Right lower extremity.   Plan:   Follow up in one week.   

## 2021-08-07 NOTE — Unmapped (Signed)
Patient is requesting the following refill  Requested Prescriptions     Pending Prescriptions Disp Refills   ??? diphenoxylate-atropine (LOMOTIL) 2.5-0.025 mg per tablet 30 tablet 5     Sig: Take 1 tablet by mouth two (2) times a day as needed for diarrhea.     Signed Prescriptions Disp Refills   ??? pantoprazole (PROTONIX) 40 MG tablet 180 tablet 1     Sig: Take 1 tablet (40 mg total) by mouth Two (2) times a day.     Authorizing Provider: [facility]     Ordering User: Barbaraann Boys       Recent Visits  Date Type Provider Dept   07/13/21 Office Visit [name] Rosita Fire, FNP [facility]   04/12/21 Office Visit [name] Rosita Fire, FNP [facility]   02/05/21 Office Visit [name] Rosita Fire, FNP [facility]   01/09/21 Office Visit [name] Rosita Fire, FNP [facility]   10/11/20 Office Visit [name] Rosita Fire, FNP [facility]   08/11/20 Office Visit [name] Rosita Fire, FNP [facility]   Showing recent visits within past 365 days with a meds authorizing provider and meeting all other requirements  Future Appointments  Date Type Provider Dept   10/10/21 Appointment [name] Rosita Fire, FNP [facility]   Showing future appointments within next 365 days with a meds authorizing provider and meeting all other requirements       Labs: Not applicable this refill

## 2021-08-08 MED ORDER — DIPHENOXYLATE-ATROPINE 2.5 MG-0.025 MG TABLET
ORAL_TABLET | Freq: Two times a day (BID) | ORAL | 5 refills | 15 days | Status: CP | PRN
Start: 2021-08-08 — End: 2022-08-08

## 2021-08-13 ENCOUNTER — Encounter (INDEPENDENT_AMBULATORY_CARE_PROVIDER_SITE_OTHER): Payer: Self-pay

## 2021-08-13 ENCOUNTER — Ambulatory Visit (INDEPENDENT_AMBULATORY_CARE_PROVIDER_SITE_OTHER): Payer: Medicare Other | Admitting: Nurse Practitioner

## 2021-08-13 ENCOUNTER — Other Ambulatory Visit: Payer: Self-pay

## 2021-08-13 VITALS — BP 150/87 | HR 86 | Resp 17 | Ht 71.0 in | Wt >= 6400 oz

## 2021-08-13 DIAGNOSIS — I890 Lymphedema, not elsewhere classified: Secondary | ICD-10-CM

## 2021-08-13 NOTE — Progress Notes (Signed)
History of Present Illness  There is no documented history at this time  Assessments & Plan   There are no diagnoses linked to this encounter.    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    Procedure:  3 layer unna wrap was placed Right lower extremity.   Plan:   Follow up in one week.   

## 2021-08-13 NOTE — Unmapped (Signed)
Dexcom data reviewed. Glucose appears to be running higher. Please be sure to limit carbs and take all medication as directed.

## 2021-08-18 ENCOUNTER — Encounter (INDEPENDENT_AMBULATORY_CARE_PROVIDER_SITE_OTHER): Payer: Self-pay | Admitting: Nurse Practitioner

## 2021-08-20 ENCOUNTER — Encounter (INDEPENDENT_AMBULATORY_CARE_PROVIDER_SITE_OTHER): Payer: Self-pay | Admitting: Nurse Practitioner

## 2021-08-20 ENCOUNTER — Other Ambulatory Visit: Payer: Self-pay

## 2021-08-20 ENCOUNTER — Ambulatory Visit (INDEPENDENT_AMBULATORY_CARE_PROVIDER_SITE_OTHER): Payer: Medicare Other | Admitting: Nurse Practitioner

## 2021-08-20 VITALS — BP 161/80 | HR 85 | Resp 16 | Wt >= 6400 oz

## 2021-08-20 DIAGNOSIS — I890 Lymphedema, not elsewhere classified: Secondary | ICD-10-CM

## 2021-08-20 DIAGNOSIS — I83813 Varicose veins of bilateral lower extremities with pain: Secondary | ICD-10-CM

## 2021-08-21 NOTE — Unmapped (Signed)
[id number] [facility] Refill Coordination Note    Specialty Medication(s) to be Shipped:   Inflammatory Disorders: Humira     Other medication(s) to be shipped: No additional medications requested for fill at this time     Logan Quinn, DOB: 1980/10/24  Phone: [phone] (home)     All above HIPAA information was verified with patient.     Was a Nurse, learning disability used for this call? No    Completed refill call assessment today to schedule patient's medication shipment from the [facility] [phone]).  All relevant notes have been reviewed.     Specialty medication(s) and dose(s) confirmed: Regimen is correct and unchanged.   Changes to medications: Woody reports no changes at this time.  Changes to insurance: No  New side effects reported not previously addressed with a pharmacist or physician: None reported  Questions for the pharmacist: No    Confirmed patient received a Conservation officer, historic buildings and a Surveyor, mining with first shipment. The patient will receive a drug information handout for each medication shipped and additional FDA Medication Guides as required.       DISEASE/MEDICATION-SPECIFIC INFORMATION        For patients on injectable medications: Patient currently has 2 doses left.  Next injection is scheduled for 08/23/2021 & 08/30/2021.    SPECIALTY MEDICATION ADHERENCE     Medication Adherence    Patient reported X missed doses in the last month: 0  Specialty Medication: Humira  Patient is on additional specialty medications: No  Any gaps in refill history greater than 2 weeks in the last 3 months: no  Demonstrates understanding of importance of adherence: yes  Informant: patient  Reliability of informant: reliable  Confirmed plan for next specialty medication refill: delivery by pharmacy  Refills needed for supportive medications: not needed        Were doses missed due to medication being on hold? No    Humira CF 80mg /0.22ml Inj: 14 days of medicine on hand REFERRAL TO PHARMACIST     Referral to the pharmacist: Not needed    [facility]     Shipping address confirmed in Epic.     Delivery Scheduled: Yes, Expected medication delivery date: 08/24/2021.     Medication will be delivered via Same Day Courier to the prescription address in Epic WAM.    Grae Cannata D Rannie Craney   [facility] Specialty Technician

## 2021-08-24 MED FILL — HUMIRA(CF) PEN 80 MG/0.8 ML SUBCUTANEOUS KIT: SUBCUTANEOUS | 28 days supply | Qty: 4 | Fill #1

## 2021-08-28 ENCOUNTER — Encounter (INDEPENDENT_AMBULATORY_CARE_PROVIDER_SITE_OTHER): Payer: Self-pay | Admitting: Nurse Practitioner

## 2021-08-28 NOTE — Progress Notes (Signed)
? ?Subjective:  ? ? Patient ID: Jason Davidson, male    DOB: [birth year], 41 y.o.   MRN: [id number] ?Chief Complaint  ?Patient presents with  ? Follow-up  ?  Unna wrap check  ? ? ?Jason Davidson 41 year old male that presents today for follow-up after having issues with swelling and redness in his right lower extremity.  The patient had been previously in October.  He has been in [place] wraps for the last 4 weeks and these have been very helpful with his swelling and discomfort.  He has also been continuing to elevate his lower extremities as well as utilizing his lymphedema pump.  There is still no open wounds or ulcerations.  He continues to have several superficial varicosities that remain painful to the touch despite use of [place] wraps.  The patient has had a previous endovenous ablation of his right lower extremity.  The tenderness is painful it does cause difficulty with his daily activities. ?  ?  ?Previous noninvasive studies showed no evidence of DVT.  Note reflux in the right great saphenous vein at the distal thigh displays reflux. ? ? ?Review of Systems ? ?   ?Objective:  ? Physical Exam ? ?BP (!) 161/80 (BP Location: Right Arm)   Pulse 85   Resp 16   Wt (!) 476 lb 3.2 oz (216 kg)   BMI 66.42 kg/m?  ? ?Past Medical History:  ?Diagnosis Date  ? Asthma   ? Depressed   ? Diabetes mellitus without complication ([place])   ? IBS (irritable bowel syndrome)   ? Morbid obesity ([place])   ? ? ?Social History  ? ?Socioeconomic History  ? Marital status: Married  ?  Spouse name: Not on file  ? Number of children: Not on file  ? Years of education: Not on file  ? Highest education level: Not on file  ?Occupational History  ? Not on file  ?Tobacco Use  ? Smoking status: Never  ? Smokeless tobacco: Never  ?Vaping Use  ? Vaping Use: Never used  ?Substance and Sexual Activity  ? Alcohol use: No  ? Drug use: No  ? Sexual activity: Not on file  ?Other Topics Concern  ? Not on file  ?Social History Narrative  ? Not on file   ? ?Social Determinants of Health  ? ?Financial Resource Strain: Not on file  ?Food Insecurity: Not on file  ?Transportation Needs: Not on file  ?Physical Activity: Not on file  ?Stress: Not on file  ?Social Connections: Not on file  ?Intimate Partner Violence: Not on file  ? ? ?Past Surgical History:  ?Procedure Laterality Date  ? EYE SURGERY    ? ? ?Family History  ?Problem Relation Age of Onset  ? Diabetes Mother   ? Depression Mother   ? Diabetes Sister   ? Diabetes Brother   ? Diabetes Maternal Uncle   ? Diabetes Maternal Grandmother   ? Heart disease Maternal Grandmother   ? Cancer Maternal Grandfather   ? COPD Paternal Grandmother   ? Cancer Paternal Grandfather   ? Varicose Veins Paternal Grandfather   ? Healthy Father   ? ? ?Allergies  ?Allergen Reactions  ? Erythromycin Nausea And Vomiting  ? Penicillins Swelling  ? Sulfa Antibiotics Nausea And Vomiting  ? ? ?CBC Latest Ref Rng & Units 02/14/2014 12/24/2013 11/30/2013  ?WBC 3.8 - 10.6 x10 3/mm 3 8.0 13.0(H) 11.0(H)  ?Hemoglobin 13.0 - 18.0 g/dL 14.8 15.4 13.7  ?Hematocrit 40.0 - 52.0 % 43.4  46.6 42.0  ?Platelets 150 - 440 x10 3/mm 3 175 184 174  ? ? ? ? ?CMP  ?   ?Component Value Date/Time  ? NA 137 02/14/2014 0437  ? K 3.8 02/14/2014 0437  ? CL 101 02/14/2014 0437  ? CO2 26 02/14/2014 0437  ? GLUCOSE 307 (H) 02/14/2014 0437  ? BUN 12 02/14/2014 0437  ? CREATININE 1.01 02/14/2014 0437  ? CALCIUM 8.4 (L) 02/14/2014 0437  ? PROT 7.4 02/14/2014 0437  ? ALBUMIN 3.5 02/14/2014 0437  ? AST 62 (H) 02/14/2014 0437  ? ALT 76 (H) 02/14/2014 0437  ? ALKPHOS 91 02/14/2014 0437  ? BILITOT 0.9 02/14/2014 0437  ? GFRNONAA >60 02/14/2014 0437  ? GFRAA >60 02/14/2014 0437  ? ? ? ?No results found. ? ?   ?Assessment & Plan:  ? ?1. Lymphedema ?Patient is advised to continue with conservative therapy including use of his medical grade compression stockings, elevation and activity.  He should also continue use of his lymphedema pump.  Patient is advised to follow-up with [place] if he  begins to have ulceration or weeping.  We will plan to have him follow-up in 3 months or sooner if issues arise. ? ?2. Varicose veins of both lower extremities with pain ?Recommend: ?  ?The patient has had successful ablation of the previously incompetent saphenous venous system but still has persistent symptoms of pain and swelling that are having a negative impact on daily life and daily activities. ?  ?Patient should undergo injection sclerotherapy to treat the residual varicosities of the right lower extremity. ?  ?The risks, benefits and alternative therapies were reviewed in detail with the patient.  All questions were answered.  The patient agrees to proceed with sclerotherapy at their convenience. ?  ?The patient will continue wearing the graduated compression stockings and using the over-the-counter pain medications to treat her symptoms. ? ? ?Current Outpatient Medications on File Prior to Visit  ?Medication Sig Dispense Refill  ? Adalimumab 40 MG/0.4ML PNKT Inject into the skin.    ? AIMOVIG 70 MG/ML SOAJ   0  ? albuterol (VENTOLIN HFA) 108 (90 Base) MCG/ACT inhaler Inhale 1-2 puffs into the lungs every 6 (six) hours as needed for wheezing or shortness of breath. 18 g 0  ? ALPRAZolam (XANAX) 0.5 MG tablet Take 0.5 mg by mouth at bedtime as needed for anxiety.    ? amphetamine-dextroamphetamine (ADDERALL) 10 MG tablet Take 10 mg by mouth 2 (two) times daily with a meal.    ? azelastine (ASTELIN) 0.1 % nasal spray Place into the nose.    ? Colchicine 0.6 MG CAPS Take 1.2 mg on Day 1 of flare, followed by 0.6 mg one hour later. Day 2 take 0.6 mg BID until flare resolves.    ? cyclobenzaprine (FLEXERIL) 10 MG tablet Take 1 tablet (10 mg total) by mouth 3 (three) times daily as needed. 15 tablet 0  ? dicyclomine (BENTYL) 10 MG capsule Take 10 mg by mouth 3 (three) times daily before meals.    ? diphenoxylate-atropine (LOMOTIL) 2.5-0.025 MG tablet Take by mouth.    ? Dulaglutide 0.75 MG/0.5ML SOPN Inject into  the skin.    ? DULoxetine (CYMBALTA) 60 MG capsule Take 120 mg by mouth daily.     ? fluticasone (FLONASE) 50 MCG/ACT nasal spray Place 1 spray into both nostrils 2 (two) times daily.    ? Fluticasone-Salmeterol (ADVAIR) 500-50 MCG/DOSE AEPB Inhale 1 puff into the lungs 2 (two) times daily.    ?  glipiZIDE (GLUCOTROL) 10 MG tablet Take 10 mg by mouth daily before breakfast.    ? glucose blood test strip Use 1 strip 3 times a day with meter    ? HUMALOG 100 UNIT/ML injection SMARTSIG:120 Unit(s) SUB-Q Daily    ? hydrochlorothiazide (HYDRODIURIL) 12.5 MG tablet Take 12.5 mg by mouth daily.    ? hydrocortisone (ANUSOL-HC) 2.5 % rectal cream Place rectally.    ? hydrOXYzine (ATARAX/VISTARIL) 25 MG tablet Take 25 mg by mouth 4 (four) times daily as needed.    ? Insulin Syringe-Needle U-100 31G X 5/16" 1 ML MISC Use as directed with the Lantus Insulin nightly    ? ketoconazole (NIZORAL) 2 % cream Apply topically daily.    ? lidocaine (XYLOCAINE) 2 % solution Use as directed 15 mLs in the mouth or throat as needed for mouth pain. 100 mL 0  ? loperamide (IMODIUM) 2 MG capsule Take by mouth.    ? ondansetron (ZOFRAN-ODT) 4 MG disintegrating tablet Take 4 mg by mouth every 8 (eight) hours as needed.    ? oxybutynin (DITROPAN) 5 MG tablet Take 5 mg by mouth 2 (two) times daily.    ? oxybutynin (DITROPAN) 5 MG tablet Take 1 tablet by mouth 2 (two) times daily.    ? pantoprazole (PROTONIX) 40 MG tablet Take 40 mg by mouth daily.    ? polyethylene glycol powder (GLYCOLAX/MIRALAX) powder take 17GM (DISSOLVED IN WATER) by mouth once daily  0  ? predniSONE (DELTASONE) 50 MG tablet One qd 5 tablet 0  ? PROAIR HFA 108 (90 Base) MCG/ACT inhaler 1-2 puffs every 4 (four) hours as needed. Last used: [id number]  0  ? promethazine (PHENERGAN) 25 MG tablet Take 25-50 mg by mouth at bedtime as needed.    ? propranolol (INNOPRAN XL) 120 MG 24 hr capsule Take 120 mg by mouth at bedtime.    ? ramelteon (ROZEREM) 8 MG tablet Take 8 mg by mouth at  bedtime.    ? sodium fluoride (FLUORISHIELD) 1.1 % GEL dental gel     ? topiramate (TOPAMAX) 50 MG tablet Take 50 mg by mouth 2 (two) times daily.    ? traZODone (DESYREL) 100 MG tablet Take by mouth.    ?

## 2021-08-30 ENCOUNTER — Telehealth
Admit: 2021-08-30 | Discharge: 2021-08-31 | Payer: MEDICARE | Attending: Physician Assistant | Primary: Physician Assistant

## 2021-08-30 DIAGNOSIS — Z9989 Dependence on other enabling machines and devices: Principal | ICD-10-CM

## 2021-08-30 DIAGNOSIS — G4733 Obstructive sleep apnea (adult) (pediatric): Principal | ICD-10-CM

## 2021-08-30 DIAGNOSIS — Z789 Other specified health status: Principal | ICD-10-CM

## 2021-08-30 NOTE — Unmapped (Signed)
I will reach out to [facility] homecare specialist with regard to your loaner CPAP device.  I do want you to restart your CPAP in the meantime.  As we discussed, I am also ordering a Pap titration study to determine what your optimal settings will be.    With regard to sleep, I would asked that you try to power through the day.  You are getting up at 5:30 AM anyway, to get your son on the bus.  I would asked that you stay up from this time so that you can go to sleep at a reasonable time.

## 2021-08-30 NOTE — Unmapped (Signed)
Date of Service: 08/30/2021     Patient Name: Logan Quinn       MRN: [id number]       Date of Birth: Apr 10, 1981  Primary Care Physician: No primary care provider on file.  Referring Provider: Pcp, Unknown Per Patient  DME: [facility]    Assessment and Plan:   Impression:   Logan Quinn is a 41 y.o. male with RLS, insomnia, and OSA.    Severe obstructive sleep apnea on CPAP: Logan Quinn has a diagnosis of sleep apnea and was initially diagnosed in 2012 and started on Pap therapy but due to difficulty with his CPAP, he was not compliant.  A split study was completed 02/26/2017 revealing severe obstructive sleep apnea with an AHI of 50 /HR and it was recommended that he be treated with CPAP 16 cm H2O EPR 2.  He reports good sleep quality on his device but started noticing that he was not getting adequate air.  Respiratory therapist from [facility] did come by and evaluated his machine and reportedly identified an issue requiring repair.  He was told that he would get a loaner device while they would repair his CPAP.  He has not received a loaner device and his CPAP device is still at home but he is unable to use it as it leads to more nighttime awakenings.  He feels that his sleep has become worse with the CPAP than without.  He is also on a higher than recommended pressure and would benefit from being placed on CPAP 16 cm H20 EPR 2.  I will reach out to [facility] to see if they can assist.    Insomnia: This can be due to his lack of CPAP use.  He does have a machine that is not working adequately.  I did ask him to retry using it in the meantime as he would benefit from partial treatment, even if it is not optimal.  I also recommended that he consolidates his sleep needs a spring replace      .    The patient reports they are currently: at home. I spent 17 minutes on the real-time audio and video with the patient on the date of service. I spent an additional 15 minutes on pre- and post-visit activities on the date of service.     The patient was physically located in [place] or a state in which I am permitted to provide care. The patient and/or parent/guardian understood that s/he may incur co-pays and cost sharing, and agreed to the telemedicine visit. The visit was reasonable and appropriate under the circumstances given the patient's presentation at the time.    The patient and/or parent/guardian has been advised of the potential risks and limitations of this mode of treatment (including, but not limited to, the absence of in-person examination) and has agreed to be treated using telemedicine. The patient's/patient's family's questions regarding telemedicine have been answered.     If the visit was completed in an ambulatory setting, the patient and/or parent/guardian has also been advised to contact their provider???s office for worsening conditions, and seek emergency medical treatment and/or call 911 if the patient deems either necessary.        Subjective:   CC: CPAP broken    History of Present Illness:     Logan Quinn is a 41 y.o. male and was last seen by me on 03/24/2019.  [name] follows up as he has a CPAP machine that is not working.  Briefly, Logan Quinn has a history of severe obstructive sleep apnea via PSG that was completed 09/17/2010 with an AHI of 51.3 /HR and had a follow-up Pap titration study 10/15/2010 recommending a trial of CPAP 15 cm H2O.  He was not compliant and ultimately stopped using CPAP device.  He reestablished care with [place] in 2018 and had his studies repeated.  A split study was completed 02/26/2017 revealing severe obstructive sleep apnea with an AHI of 50 /HR and is recommended that he be treated with CPAP 16 cm H2O EPR 2.  He states that his CPAP device stopped working in December and had it evaluated by Logan Quinn in January.  They reportedly found a problem and stated that they would have it repaired and will provide him with a loaner CPAP device but this is not occurred.  He states that he tried using the device despite this and found that it made his sleep worse.  He also has chronic insomnia, anxiety, depression as well as ADD.  His medications include alprazolam 0.5 mg 3 times a day, cyclobenzaprine 10 mg prn (has not used this in a few weeks), Adderall XR 20 mg daily, duloxetine 60 mg twice daily, hydroxyzine 25 mg every 6 hours prn, ramelteon 8 mg nightly, Topamax 75 mg twice daily.  He also has restless legs but this appears to be stable.  With regards to his sleep, he goes to bed between 8:45 PM-9:30 PM.  He states that it takes him until around 2 AM to fall asleep and when he falls asleep he wakes up 2-3 times a night as he is a light sleeper.  His rise time is at 5:30 AM as he has to take his son to the bus.  Once his son is off, he will go back to sleep until noon time.  He denies any caffeine.  States that alcohol is very rare and denies any tobacco products.    Review of Systems:  A 14-system review was completed and found to be negative except for what is mentioned in the HPI.     PMH:  Past Medical History:   Diagnosis Date   ??? Acne    ??? Acquired hypothyroidism 01/09/2021   ??? Allergic    ??? Anxiety    ??? Depression    ??? Diabetes mellitus (CMS-HCC) Dx 2013    Type II   ??? GERD (gastroesophageal reflux disease)    ??? Gout    ??? Hypertension    ??? IBS (irritable bowel syndrome)    ??? 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 Liver disease    ??? Migraines    ??? Morbid obesity with BMI of 60.0-69.9, adult (CMS-HCC)    ??? 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 Obstructive sleep apnea    ??? OSA on CPAP    ??? Severe obstructive sleep apnea    ??? Trapezius muscle strain 12/07/2013   ??? Urinary incontinence, nocturnal enuresis    ??? Venous insufficiency        Past Surgical Hx:  Past Surgical History:   Procedure Laterality Date   ??? EYE SURGERY  11/15   ??? PR COLONOSCOPY FLX DX W/COLLJ SPEC WHEN PFRMD N/A 03/24/2013    Procedure: COLONOSCOPY, FLEXIBLE, PROXIMAL TO SPLENIC FLEXURE; DIAGNOSTIC, W/WO COLLECTION SPECIMEN BY BRUSH OR WASH;  Surgeon: Clint Bolder, MD;  [facility];  Service:  Gastroenterology   ??? PR EYE SURG POST SGMT PROC UNLISTED Left     pneumatic retinopexy OS   ??? PR UPPER GI ENDOSCOPY,DIAGNOSIS N/A 02/02/2013    Procedure: UGI ENDO, INCLUDE ESOPHAGUS, STOMACH, & DUODENUM &/OR JEJUNUM; DX W/WO COLLECTION SPECIMN, BY BRUSH OR WASH;  Surgeon: Malcolm Metro, MD;  Location: [facility] [place] [facility];  Service: Gastroenterology   ??? [place] PYLORIC STENOSIS ([place] HISTORICAL RESULT)         FamHx:  Family History   Problem Relation Age of Onset   ??? Cancer Maternal Grandfather    ??? Hearing loss Maternal Grandfather    ??? Cancer Paternal Grandfather    ??? COPD Paternal Grandmother    ??? Arthritis Paternal Grandmother    ??? Depression Paternal Grandmother    ??? Diabetes Mother    ??? Heart disease Mother    ??? Migraines Mother    ??? Arthritis Mother    ??? Depression Mother    ??? GER disease Mother    ??? Hypertension Mother    ??? Angina Mother    ??? COPD Mother    ??? Glaucoma Mother    ??? Hearing loss Mother    ??? Diabetes Sister    ??? Migraines Sister    ??? Asthma Sister    ??? Depression Sister    ??? Hearing loss Sister    ??? Diabetes Brother    ??? Asthma Brother    ??? Diabetes Maternal Grandmother    ??? Heart disease Maternal Grandmother    ??? Migraines Maternal Grandmother    ??? Depression Maternal Grandmother    ??? Angina Maternal Grandmother    ??? Hypertension Maternal Grandmother    ??? Stroke Maternal Grandmother    ??? Diabetes Maternal Uncle    ??? Hearing loss Maternal Uncle    ??? Diabetes Maternal Uncle         resulted in need for kidney transplant   ??? Liver disease Maternal Uncle    ??? Kidney disease Maternal Uncle         needed kidney transplant   ??? Asthma Brother    ??? No Known Problems Father    ??? No Known Problems Paternal Aunt    ??? No Known Problems Paternal Uncle    ??? No Known Problems Other    ??? Colorectal Cancer Neg Hx    ??? Esophageal cancer Neg Hx    ??? Liver cancer Neg Hx    ??? Pancreatic cancer Neg Hx    ??? Stomach cancer Neg Hx    ??? Amblyopia Neg Hx    ??? Blindness Neg Hx    ??? Retinal detachment Neg Hx    ??? Strabismus Neg Hx    ??? Macular degeneration Neg Hx    ??? Anesthesia problems Neg Hx    ??? Broken bones Neg Hx    ??? Clotting disorder Neg Hx    ??? Collagen disease Neg Hx    ??? Dislocations Neg Hx    ??? Fibromyalgia Neg Hx    ??? Gout Neg Hx    ??? Hemophilia Neg Hx    ??? Osteoporosis Neg Hx    ??? Rheumatologic disease Neg Hx    ??? Scoliosis Neg Hx    ??? Severe sprains Neg Hx    ??? Sickle cell anemia Neg Hx    ??? Spinal Compression Fracture Neg Hx    ??? Melanoma Neg Hx    ??? Basal cell carcinoma Neg Hx    ???  Squamous cell carcinoma Neg Hx    ??? Deep vein thrombosis Neg Hx    ??? Cataracts Neg Hx    ??? Thyroid disease Neg Hx        Social History:  Social History     Socioeconomic History   ??? Marital status: Married   Tobacco Use   ??? Smoking status: Never   ??? Smokeless tobacco: Never   Vaping Use   ??? Vaping Use: Never used   Substance and Sexual Activity   ??? Alcohol use: Not Currently     Comment: rare social   ??? Drug use: Never   ??? Sexual activity: Yes     Partners: Female     Birth control/protection: None   Other Topics Concern   ??? Do you use sunscreen? Yes   ??? Tanning bed use? No   ??? Are you easily burned? No   ??? Excessive sun exposure? No   ??? Blistering sunburns? No     Social Determinants of Health     Financial Resource Strain: Low Risk    ??? Difficulty of Paying Living Expenses: Not very hard   Food Insecurity: No Food Insecurity   ??? Worried About Running Out of Food in the Last Year: Never true   ??? Ran Out of Food in the Last Year: Never true   Transportation Needs: No Transportation Needs   ??? Lack of Transportation (Medical): No   ??? Lack of Transportation (Non-Medical): No       Medications:    Current Outpatient Medications:   ???  acetaminophen (TYLENOL) 500 MG tablet, Take 2 tablets (1,000 mg total) by mouth Three (3) times a day. (Patient taking differently: Take 1 tablet (500 mg total) by mouth Three (3) times a day.), Disp: 30 tablet, Rfl: 0  ???  adalimumab (HUMIRA,CF, PEN) 80 mg/0.8 mL PnKt, Inject the contents of 1 pen (80mg ) under the skin once a week., Disp: 4 each, Rfl: 11  ???  albuterol 2.5 mg /3 mL (0.083 %) nebulizer solution, Inhale 3 mL (2.5 mg total) by nebulization every four (4) hours as needed for wheezing., Disp: 180 mL, Rfl: 2  ???  albuterol HFA 90 mcg/actuation inhaler, Inhale 2 puffs every six (6) hours as needed for wheezing., Disp: 8 g, Rfl: 5  ???  ALPRAZolam (XANAX) 0.5 MG tablet, Take 1 tablet (0.5 mg total) by mouth Three (3) times a day., Disp: 90 tablet, Rfl: 1  ???  budesonide-formoteroL (SYMBICORT) 80-4.5 mcg/actuation inhaler, Inhale 2 puffs Two (2) times a day., Disp: 10.2 g, Rfl: 11  ???  colchicine (MITIGARE) 0.6 mg cap capsule, TAKE 2 CAPSULES BY MOUTH ON DAY ONE, FOLLOWED BY ONE CAPSULE ONE HOUR LATER, ON DAY 2 TAKE ONE CAPSULE BY MOUTH TWICEA DAY UNTIL RESOLVED, Disp: 60 capsule, Rfl: 0  ???  cyclobenzaprine (FLEXERIL) 10 MG tablet, Take 1 tablet (10 mg total) by mouth Three (3) times a day as needed for muscle spasms., Disp: 60 tablet, Rfl: 1  ???  dextroamphetamine-amphetamine (ADDERALL XR) 20 MG 24 hr capsule, Take 1 capsule (20 mg total) by mouth every morning., Disp: , Rfl:   ???  dicyclomine (BENTYL) 10 mg capsule, Take 1 capsule (10 mg total) by mouth Three (3) times a day., Disp: 270 capsule, Rfl: 1  ???  diphenoxylate-atropine (LOMOTIL) 2.5-0.025 mg per tablet, Take 1 tablet by mouth two (2) times a day as needed for diarrhea., Disp: 30 tablet, Rfl: 5  ???  dulaglutide (TRULICITY) 1.5 mg/0.5 mL  PnIj, Inject 0.5 mL (1.5 mg total) under the skin every seven (7) days., Disp: 2 mL, Rfl: 5  ???  DULoxetine (CYMBALTA) 60 MG capsule, Take 1 capsule (60 mg total) by mouth Two (2) times a day., Disp: 180 capsule, Rfl: 0  ???  erenumab-aooe (AIMOVIG AUTOINJECTOR) 140 mg/mL AtIn, Inject 140 mg under the skin every thirty (30) days., Disp: 3 mL, Rfl: 1  ???  famotidine (PEPCID) 20 MG tablet, Take 1 tablet (20 mg total) by mouth Two (2) times a day., Disp: 180 tablet, Rfl: 2  ???  fluticasone propionate (FLONASE) 50 mcg/actuation nasal spray, 1 spray into each nostril Two (2) times a day., Disp: 16 g, Rfl: 5  ???  glipiZIDE (GLUCOTROL) 10 MG tablet, TAKE TWO TABLETS BY MOUTH TWICE A DAY BEFORE A MEAL, Disp: 120 tablet, Rfl: 1  ???  hydroCHLOROthiazide (HYDRODIURIL) 25 MG tablet, Take 1 tablet (25 mg total) by mouth daily., Disp: 90 tablet, Rfl: 3  ???  hydrOXYzine (ATARAX) 25 MG tablet, Take 1 tablet (25 mg total) by mouth Three (3) times a day as needed. (Patient taking differently: Take 1 tablet (25 mg total) by mouth four (4) times a day as needed.), Disp: 120 tablet, Rfl: 1  ???  levothyroxine (SYNTHROID) 50 MCG tablet, Take 1 tablet (50 mcg total) by mouth daily., Disp: 90 tablet, Rfl: 1  ???  montelukast (SINGULAIR) 10 mg tablet, Take 1 tablet (10 mg total) by mouth daily., Disp: 90 tablet, Rfl: 3  ???  oxybutynin (DITROPAN) 5 MG tablet, TAKE 1 TABLET BY MOUTH TWICE A DAY, Disp: 60 tablet, Rfl: 1  ???  pantoprazole (PROTONIX) 40 MG tablet, Take 1 tablet (40 mg total) by mouth Two (2) times a day., Disp: 180 tablet, Rfl: 1  ???  promethazine (PHENERGAN) 25 MG tablet, Take 1-2 tablets (25-50 mg total) by mouth nightly as needed for nausea., Disp: 30 tablet, Rfl: 3  ???  propranoloL (INDERAL LA) 120 mg 24 hr capsule, Take 1 capsule (120 mg total) by mouth in the morning., Disp: 90 capsule, Rfl: 0  ???  ramelteon (ROZEREM) 8 mg tablet, Take 1 tablet (8 mg total) by mouth nightly., Disp: , Rfl:   ???  topiramate (TOPAMAX) 50 MG tablet, Take 1.5 tablets (75 mg total) by mouth Two (2) times a day., Disp: 270 tablet, Rfl: 1  ???  ascorbic acid (VITAMIN C ORAL), Take 1 capsule by mouth nightly., Disp: , Rfl:   ???  blood sugar diagnostic (ACCU-CHEK GUIDE TEST STRIPS) Strp, by Other route Three (3) times a day before meals., Disp: 300 strip, Rfl: 1  ???  blood sugar diagnostic (ACCU-CHEK GUIDE TEST STRIPS) Strp, Accu-Chek Guide test strips  USE TO CHECK BLOOD SUGAR 3 TIMES DAILY BEFORE MEALS, Disp: , Rfl:   ???  blood-glucose meter kit, Use as directed, Disp: 1 each, Rfl: 0  ???  clindamycin (CLEOCIN T) 1 % external solution, clindamycin phosphate 1 % topical solution  APPLY TOPICALLY TO GROIN AREA TWICE A DAY, Disp: , Rfl:   ???  clindamycin (CLEOCIN T) 1 % lotion, APPLY TOPICALLY AT NIGHT TO INFLAMED AREAS AS NEEDED FOR FLARES, Disp: , Rfl:   ???  clobetasoL (TEMOVATE) 0.05 % ointment, Apply daily to painful affected areas if needed for flares, then stop, Disp: 15 g, Rfl: 1  ???  empty container (SHARPS CONTAINER) Misc, Use as directed to dispose of Humira needles, Disp: 1 each, Rfl: 2  ???  empty container Misc, Use as directed,  Disp: 1 each, Rfl: 2  ???  fluoride, sodium, 1.1 % Pste, , Disp: , Rfl:   ???  HUMALOG U-100 INSULIN 100 unit/mL injection, INJECT 120 UNITS UNDER THE SKIN DAILY VIA OMNIPOD, Disp: 40 mL, Rfl: 5  ???  hydrocortisone (PROCTOSOL HC) 2.5 % rectal cream, Insert 1 application into the rectum two (2) times a day as needed., Disp: 28.35 g, Rfl: 2  ???  ibuprofen (ADVIL,MOTRIN) 800 MG tablet, Take 1 tablet (800 mg total) by mouth every eight (8) hours as needed., Disp: 90 tablet, Rfl: 1  ???  insulin pump cartridge Crtg, Inject 120 Units under the skin daily. (Patient taking differently: Inject 200 Units under the skin. About every 2-3 days), Disp: 5 each, Rfl: 5  ???  insulin syringe-needle U-100 1 mL 31 gauge x 5/16 (8 mm) Syrg, insulin syringe U-100 with needle 1 mL 31 gauge x 5/16  USE UP TO 5 TIMES A DAY TO ADMINISTER INSULIN, Disp: , Rfl:   ???  ketoconazole (NIZORAL) 2 % cream, Apply 1 application topically daily. To the affected area of the groin, Disp: 60 g, Rfl: 11  ???  lancets (ACCU-CHEK FASTCLIX LANCET DRUM) Misc, Use to check blood sugars 3 times a day before meals or as directed, Disp: 200 each, Rfl: 5  ???  lidocaine (XYLOCAINE) 5 % ointment, lidocaine 5 % topical ointment, Disp: , Rfl:   ???  multivitamin (MULTIPLE VITAMIN ORAL), Take 1 tablet by mouth daily., Disp: , Rfl:   ???  ondansetron (ZOFRAN-ODT) 4 MG disintegrating tablet, Take 1 tablet (4 mg total) by mouth every eight (8) hours as needed for nausea for up to 15 doses., Disp: 15 tablet, Rfl: 1  ???  polyethylene glycol (GLYCOLAX) 17 gram/dose powder, take 17GM (DISSOLVED IN WATER) by mouth once daily, Disp: , Rfl:   ???  sour cherry extract (TART CHERRY EXTRACT) 1,000 mg cap, , Disp: , Rfl:     Allergies:  Allergies   Allergen Reactions   ??? Erythromycin Nausea And Vomiting     Other reaction(s): Vomiting   ??? Penicillins Rash, Swelling and Hives   ??? Sulfa (Sulfonamide Antibiotics) Nausea And Vomiting and Nausea Only   ??? Other             Objective:     Physical Exam:     There were no vitals filed for this visit.  There is no height or weight on file to calculate BMI.      Awake alert appropriate  EOMI  Tongue midline  Face symmetric  Clear speech  Follows commands  Moves all extremities spontaneously and antigravity    Data/ Lab/ Inventory Review:   Polysomnogram results as above  History obtained from patient  Imaging   Blood/ serum laboratory tests  No results found for: CBC  Ferritin   Date Value Ref Range Status   03/24/2019 87.7 27.0 - 377.0 ng/mL Final     Iron Saturation (%)   Date Value Ref Range Status   03/24/2019 27 20 - 50 % Final     TSH   Date Value Ref Range Status   01/09/2021 1.637 0.550 - 4.780 uIU/mL Final     Free T4   Date Value Ref Range Status   07/08/2018 0.94 0.71 - 1.40 ng/dL Final     No results found for: VITAMINB12  CO2   Date Value Ref Range Status   01/18/2021 28.7 20.0 - 31.0 mmol/L Final  Author:  Patrcia Dolly 08/30/2021 2:09 PM    Note - This record has been created using [employer]. Chart creation errors have been sought, but may not always have been located. Such creation errors do not reflect on the standard of medical care.

## 2021-09-10 NOTE — Unmapped (Signed)
Patient called in stating that he has had ongoing congestion and sore throat for over a month. Patient does not want to be scheduled with any other provider. Advised nothing sooner to schedule with [name] currently. He wanted to let her know how he was doing.    FYI

## 2021-09-10 NOTE — Unmapped (Signed)
Drainage, hoarse, and cough from drainage.  Thinks it allergies.  Taking Zyrtec in the AM and Allegra in the PM without relief.  Denies SOB, wheezing, and is afebrile.   Checked for Covid several times and is negative.  Doesn't want to see other providers and wants a call if we have cancellation.  He is aware Lorina Rabon is here half a day on Thursday and off on Friday.

## 2021-09-11 ENCOUNTER — Ambulatory Visit: Admit: 2021-09-11 | Discharge: 2021-09-12 | Payer: MEDICARE | Attending: Family | Primary: Family

## 2021-09-11 DIAGNOSIS — J0140 Acute pansinusitis, unspecified: Principal | ICD-10-CM

## 2021-09-11 MED ORDER — DOXYCYCLINE HYCLATE 100 MG CAPSULE
ORAL_CAPSULE | Freq: Two times a day (BID) | ORAL | 0 refills | 10 days | Status: CP
Start: 2021-09-11 — End: 2021-09-21

## 2021-09-11 NOTE — Unmapped (Signed)
Assessment/Plan:      Logan Quinn was seen today for drainage, hoarse, cough, sore throat and fatigue.    Diagnoses and all orders for this visit:    Acute non-recurrent pansinusitis  -     doxycycline (VIBRAMYCIN) 100 MG capsule; Take 1 capsule (100 mg total) by mouth Two (2) times a day for 10 days.    Negative home COVID test.  Recommend for nose bleeds- humidifier in the bedroom. Vaseline/aquaphor to the nostrils several times a day.  Rx sent in for doxycyline 100mg  BID X  7 Days- for bacterial sinusitis.  Given ER/urgent care precautions.  Follow up if symptoms persist or worsen.  Patient verbalizes understanding and has no further questions at this time.    Diagnosis and plan along with any newly prescribed medication(s) were discussed in detail with this patient today.  The patient verbalized understanding and agreed with the plan without language barriers or behavioral barriers to understanding unless otherwise noted.    Subjective:      HPI: Logan Quinn is a 41 y.o. male is here for    Chief Complaint   Patient presents with    Drainage     Sick x 1 month.  Afebrile.    Hoarse    Cough     Drainage makes him cough.    Sore Throat    Fatigue       Patient presents with a 4 week history of congestion involving nose, sinus, throat, and chest.  Additional symptoms include watery eyes, nose bleeds, sore throat and post nasal drainage. Nasal drainage is discolored, green-yellow.  There is some    facial pressure.  Cough is non-productive.  Symptoms are generally worse inconsistentlyZy.  For relief, he has tried Zyrtec, Allegra, Mucinex, Flonase and Ocean saline spray. Seen by pulmonology, changed Advair to Symbicort.     No fever, body aches, chills, HA.   No nausea, vomiting or diarrhea.   Slight nausea with excessive coughing.     Recent travel: none  His history is notable for asthma and diabetes.  Sick contacts: family members (infant son sick off/on)        Answers submitted by the patient for this visit:  Sore Throat Questionnaire (Submitted on 09/11/2021)  Chief Complaint: Sore throat  Chronicity: new  Onset: more than 1 month ago  Progression since onset: waxing and waning  Pain worse on: neither  Fever: no fever  Pain - numeric: 6/10  congestion: Yes  cough: Yes  hoarse voice: Yes  trouble swallowing: Yes       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)     Obstructive sleep apnea     OSA on CPAP     Severe obstructive sleep apnea     Trapezius muscle strain 12/07/2013    Urinary incontinence, nocturnal enuresis  Venous insufficiency      Past Surgical History:   Procedure Laterality Date    EYE SURGERY  11/15    PR COLONOSCOPY FLX DX W/COLLJ SPEC WHEN PFRMD N/A 03/24/2013    Procedure: COLONOSCOPY, FLEXIBLE, PROXIMAL TO SPLENIC FLEXURE; DIAGNOSTIC, W/WO COLLECTION SPECIMEN BY BRUSH OR WASH;  Surgeon: Clint Bolder, MD;  [facility];  Service: Gastroenterology    PR EYE SURG POST SGMT PROC UNLISTED Left     pneumatic retinopexy OS    PR UPPER GI ENDOSCOPY,DIAGNOSIS N/A 02/02/2013    Procedure: UGI ENDO, INCLUDE ESOPHAGUS, STOMACH, & DUODENUM &/OR JEJUNUM; DX W/WO COLLECTION SPECIMN, BY BRUSH OR WASH;  Surgeon: Malcolm Metro, MD;  Location: [facility];  Service: Gastroenterology    [place] PYLORIC STENOSIS ([place] HISTORICAL RESULT)         Family History:     Family History   Problem Relation Age of Onset    Cancer Maternal Grandfather     Hearing loss Maternal Grandfather     Cancer Paternal Grandfather     COPD Paternal Grandmother Arthritis Paternal Grandmother     Depression Paternal Grandmother     Diabetes Mother     Heart disease Mother     Migraines Mother     Arthritis Mother     Depression Mother     GER disease Mother     Hypertension Mother     Angina Mother     COPD Mother     Glaucoma Mother     Hearing loss Mother     Diabetes Sister     Migraines Sister     Asthma Sister     Depression Sister     Hearing loss Sister     Diabetes Brother     Asthma Brother     Diabetes Maternal Grandmother     Heart disease Maternal Grandmother     Migraines Maternal Grandmother     Depression Maternal Grandmother     Angina Maternal Grandmother     Hypertension Maternal Grandmother     Stroke Maternal Grandmother     Diabetes Maternal Uncle     Hearing loss Maternal Uncle     Diabetes Maternal Uncle         resulted in need for kidney transplant    Liver disease Maternal Uncle     Kidney disease Maternal Uncle         needed kidney transplant    Asthma Brother     No Known Problems Father     No Known Problems Paternal Aunt     No Known Problems Paternal Uncle     No Known Problems Other     Colorectal Cancer Neg Hx     Esophageal cancer Neg Hx     Liver cancer Neg Hx     Pancreatic cancer Neg Hx     Stomach cancer Neg Hx     Amblyopia Neg Hx     Blindness Neg Hx     Retinal detachment Neg Hx     Strabismus Neg Hx     Macular degeneration Neg Hx     Anesthesia problems Neg Hx     Broken bones Neg Hx     Clotting disorder Neg Hx     Collagen disease Neg Hx     Dislocations Neg Hx     Fibromyalgia Neg Hx     Gout Neg Hx     Hemophilia Neg  Hx     Osteoporosis Neg Hx     Rheumatologic disease Neg Hx     Scoliosis Neg Hx     Severe sprains Neg Hx     Sickle cell anemia Neg Hx     Spinal Compression Fracture Neg Hx     Melanoma Neg Hx     Basal cell carcinoma Neg Hx     Squamous cell carcinoma Neg Hx     Deep vein thrombosis Neg Hx     Cataracts Neg Hx     Thyroid disease Neg Hx        Social History:     Social History     Socioeconomic History Marital status: Married     Spouse name: None    Number of children: None    Years of education: None    Highest education level: None   Tobacco Use    Smoking status: Never    Smokeless tobacco: Never   Vaping Use    Vaping Use: Never used   Substance and Sexual Activity    Alcohol use: Not Currently     Comment: rare social    Drug use: Never    Sexual activity: Yes     Partners: Female     Birth control/protection: None   Other Topics Concern    Do you use sunscreen? Yes    Tanning bed use? No    Are you easily burned? No    Excessive sun exposure? No    Blistering sunburns? No     Social Determinants of Psychologist, prison and probation services Strain: Low Risk     Difficulty of Paying Living Expenses: Not very hard   Food Insecurity: No Food Insecurity    Worried About Programme researcher, broadcasting/film/video in the Last Year: Never true    Ran Out of Food in the Last Year: Never true   Transportation Needs: No Transportation Needs    Lack of Transportation (Medical): No    Lack of Transportation (Non-Medical): No       Allergies:     Erythromycin, Penicillins, Sulfa (sulfonamide antibiotics), and Other    Current Medications:     Current Outpatient Medications   Medication Sig Dispense Refill    acetaminophen (TYLENOL) 500 MG tablet Take 2 tablets (1,000 mg total) by mouth Three (3) times a day. (Patient taking differently: Take 1 tablet (500 mg total) by mouth Three (3) times a day.) 30 tablet 0    adalimumab (HUMIRA,CF, PEN) 80 mg/0.8 mL PnKt Inject the contents of 1 pen (80mg ) under the skin once a week. 4 each 11    albuterol 2.5 mg /3 mL (0.083 %) nebulizer solution Inhale 3 mL (2.5 mg total) by nebulization every four (4) hours as needed for wheezing. 180 mL 2    albuterol HFA 90 mcg/actuation inhaler Inhale 2 puffs every six (6) hours as needed for wheezing. 8 g 5    ALPRAZolam (XANAX) 0.5 MG tablet Take 1 tablet (0.5 mg total) by mouth Three (3) times a day. 90 tablet 1    ascorbic acid (VITAMIN C ORAL) Take 1 capsule by mouth nightly.      blood sugar diagnostic (ACCU-CHEK GUIDE TEST STRIPS) Strp by Other route Three (3) times a day before meals. 300 strip 1    blood sugar diagnostic (ACCU-CHEK GUIDE TEST STRIPS) Strp Accu-Chek Guide test strips   USE TO CHECK BLOOD SUGAR 3 TIMES DAILY BEFORE MEALS  blood-glucose meter kit Use as directed 1 each 0    budesonide-formoteroL (SYMBICORT) 80-4.5 mcg/actuation inhaler Inhale 2 puffs Two (2) times a day. 10.2 g 11    clindamycin (CLEOCIN T) 1 % external solution clindamycin phosphate 1 % topical solution   APPLY TOPICALLY TO GROIN AREA TWICE A DAY      clindamycin (CLEOCIN T) 1 % lotion APPLY TOPICALLY AT NIGHT TO INFLAMED AREAS AS NEEDED FOR FLARES      clobetasoL (TEMOVATE) 0.05 % ointment Apply daily to painful affected areas if needed for flares, then stop 15 g 1    colchicine (MITIGARE) 0.6 mg cap capsule TAKE 2 CAPSULES BY MOUTH ON DAY ONE, FOLLOWED BY ONE CAPSULE ONE HOUR LATER, ON DAY 2 TAKE ONE CAPSULE BY MOUTH TWICEA DAY UNTIL RESOLVED 60 capsule 0    cyclobenzaprine (FLEXERIL) 10 MG tablet Take 1 tablet (10 mg total) by mouth Three (3) times a day as needed for muscle spasms. 60 tablet 1    dextroamphetamine-amphetamine (ADDERALL XR) 20 MG 24 hr capsule Take 1 capsule (20 mg total) by mouth every morning.      dicyclomine (BENTYL) 10 mg capsule Take 1 capsule (10 mg total) by mouth Three (3) times a day. 270 capsule 1    diphenoxylate-atropine (LOMOTIL) 2.5-0.025 mg per tablet Take 1 tablet by mouth two (2) times a day as needed for diarrhea. 30 tablet 5    dulaglutide (TRULICITY) 1.5 mg/0.5 mL PnIj Inject 0.5 mL (1.5 mg total) under the skin every seven (7) days. 2 mL 5    DULoxetine (CYMBALTA) 60 MG capsule Take 1 capsule (60 mg total) by mouth Two (2) times a day. 180 capsule 0    empty container (SHARPS CONTAINER) Misc Use as directed to dispose of Humira needles 1 each 2    empty container Misc Use as directed 1 each 2    erenumab-aooe (AIMOVIG AUTOINJECTOR) 140 mg/mL AtIn Inject 140 mg under the skin every thirty (30) days. 3 mL 1    famotidine (PEPCID) 20 MG tablet Take 1 tablet (20 mg total) by mouth Two (2) times a day. 180 tablet 2    fluoride, sodium, 1.1 % Pste       fluticasone propionate (FLONASE) 50 mcg/actuation nasal spray 1 spray into each nostril Two (2) times a day. 16 g 5    glipiZIDE (GLUCOTROL) 10 MG tablet TAKE TWO TABLETS BY MOUTH TWICE A DAY BEFORE A MEAL 120 tablet 1    HUMALOG U-100 INSULIN 100 unit/mL injection INJECT 120 UNITS UNDER THE SKIN DAILY VIA OMNIPOD 40 mL 5    hydroCHLOROthiazide (HYDRODIURIL) 25 MG tablet Take 1 tablet (25 mg total) by mouth daily. 90 tablet 3    hydrocortisone (PROCTOSOL HC) 2.5 % rectal cream Insert 1 application into the rectum two (2) times a day as needed. 28.35 g 2    hydrOXYzine (ATARAX) 25 MG tablet Take 1 tablet (25 mg total) by mouth Three (3) times a day as needed. (Patient taking differently: Take 1 tablet (25 mg total) by mouth four (4) times a day as needed.) 120 tablet 1    ibuprofen (ADVIL,MOTRIN) 800 MG tablet Take 1 tablet (800 mg total) by mouth every eight (8) hours as needed. 90 tablet 1    insulin pump cartridge Crtg Inject 120 Units under the skin daily. (Patient taking differently: Inject 200 Units under the skin. About every 2-3 days) 5 each 5    ketoconazole (NIZORAL) 2 % cream Apply  1 application topically daily. To the affected area of the groin 60 g 11    lancets (ACCU-CHEK FASTCLIX LANCET DRUM) Misc Use to check blood sugars 3 times a day before meals or as directed 200 each 5    levothyroxine (SYNTHROID) 50 MCG tablet Take 1 tablet (50 mcg total) by mouth daily. 90 tablet 1    lidocaine (XYLOCAINE) 5 % ointment lidocaine 5 % topical ointment      montelukast (SINGULAIR) 10 mg tablet Take 1 tablet (10 mg total) by mouth daily. 90 tablet 3    multivitamin (MULTIPLE VITAMIN ORAL) Take 1 tablet by mouth daily.      ondansetron (ZOFRAN-ODT) 4 MG disintegrating tablet Take 1 tablet (4 mg total) by mouth every eight (8) hours as needed for nausea for up to 15 doses. 15 tablet 1    oxybutynin (DITROPAN) 5 MG tablet TAKE 1 TABLET BY MOUTH TWICE A DAY 60 tablet 1    pantoprazole (PROTONIX) 40 MG tablet Take 1 tablet (40 mg total) by mouth Two (2) times a day. 180 tablet 1    polyethylene glycol (GLYCOLAX) 17 gram/dose powder take 17GM (DISSOLVED IN WATER) by mouth once daily      promethazine (PHENERGAN) 25 MG tablet Take 1-2 tablets (25-50 mg total) by mouth nightly as needed for nausea. 30 tablet 3    propranoloL (INDERAL LA) 120 mg 24 hr capsule Take 1 capsule (120 mg total) by mouth in the morning. 90 capsule 0    ramelteon (ROZEREM) 8 mg tablet Take 1 tablet (8 mg total) by mouth nightly.      sour cherry extract (TART CHERRY EXTRACT) 1,000 mg cap       topiramate (TOPAMAX) 50 MG tablet Take 1.5 tablets (75 mg total) by mouth Two (2) times a day. 270 tablet 1    insulin syringe-needle U-100 1 mL 31 gauge x 5/16 (8 mm) Syrg insulin syringe U-100 with needle 1 mL 31 gauge x 5/16   USE UP TO 5 TIMES A DAY TO ADMINISTER INSULIN (Patient not taking: Reported on 09/11/2021)       No current facility-administered medications for this visit.       ROS:     Pertinent items are noted in HPI.  The following portions of the patient's history were reviewed and updated as appropriate: allergies, current medications, past medical history, past social history and problem list.    Vital Signs:     Wt Readings from Last 3 Encounters:   09/11/21 (!) 225.4 kg (497 lb)   08/06/21 (!) 219.1 kg (483 lb)   07/13/21 (!) 226.8 kg (500 lb)     Temp Readings from Last 3 Encounters:   09/11/21 36.9 ??C (98.5 ??F) (Oral)   08/06/21 36.7 ??C (98.1 ??F) (Temporal)   07/13/21 37.1 ??C (98.7 ??F) (Oral)     BP Readings from Last 3 Encounters:   09/11/21 118/72   08/06/21 115/87   07/13/21 148/84     Pulse Readings from Last 3 Encounters:   09/11/21 77   08/06/21 110   07/13/21 73     Estimated body mass index is 71.07 kg/m?? as calculated from the following:    Height as of this encounter: 178.1 cm (5' 10.12).    Weight as of this encounter: 225.4 kg (497 lb).  Facility age limit for growth percentiles is 20 years.         Objective:      General: Alert & Oriented X 3,  NAD  EYES:  No scleral injection or discharge.  ENT: External auditory canals and tympanic membranes clear bilaterally.  Mild frontal and maxillary sinus tenderness.  Oropharynx moist without lesions.  Purulent pharyngeal exudate.    Lung: clear to auscultation bilaterally, respiratory effort unremarkable. No wheezes or   rhonci.  Heart: regular rate and rhythm, S1, S2 normal, no murmur, click, rub or gallop  NECK: Normal size and contour. Thyroid smooth and symmetric without focal nodules.   LYMPH: No enlarged cervical or supraclavicular nodes palpated.      Labs:     No results found for this visit on 09/11/21.

## 2021-09-11 NOTE — Unmapped (Deleted)
Assessment and Plan:     Problem List Items Addressed This Visit    None      I personally spent *** minutes face-to-face and non-face-to-face in the care of this patient, which includes all pre, intra, and post visit time on the date of service.    No follow-ups on file.    HPI:     Logan Quinn is here for No chief complaint on file.  Patient was last seen by me 07/13/2021 for uncontrolled diabetes mellitus and hypothyroidism. Logan Quinn is here today due to allergies.     {kerichronicdx:74702}    {keriacutedx:74703}    ROS:   Comprehensive 10 point ROS negative unless otherwise stated in the HPI.      [facility] Components:   Medication adherence and barriers to the treatment plan have been addressed. Opportunities to optimize healthy behaviors have been discussed. Patient / caregiver voiced understanding.    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)     Obstructive sleep apnea     OSA on CPAP     Severe obstructive sleep apnea     Trapezius muscle strain 12/07/2013    Urinary incontinence, nocturnal enuresis     Venous insufficiency      Past Surgical History:   Procedure Laterality Date    EYE SURGERY  11/15    PR COLONOSCOPY FLX DX W/COLLJ SPEC WHEN PFRMD N/A 03/24/2013    Procedure: COLONOSCOPY, FLEXIBLE, PROXIMAL TO SPLENIC FLEXURE; DIAGNOSTIC, W/WO COLLECTION SPECIMEN BY BRUSH OR WASH;  Surgeon: Clint Bolder, MD;  [facility];  Service: Gastroenterology    PR EYE SURG POST SGMT PROC UNLISTED Left     pneumatic retinopexy OS    PR UPPER GI ENDOSCOPY,DIAGNOSIS N/A 02/02/2013    Procedure: UGI ENDO, INCLUDE ESOPHAGUS, STOMACH, & DUODENUM &/OR JEJUNUM; DX W/WO COLLECTION SPECIMN, BY BRUSH OR WASH;  Surgeon: Malcolm Metro, MD;  [facility];  Service: Gastroenterology    [place] PYLORIC STENOSIS ([place] HISTORICAL RESULT)         Family History:     Family History   Problem Relation Age of Onset    Cancer Maternal Grandfather     Hearing loss Maternal Grandfather     Cancer Paternal Grandfather     COPD Paternal Grandmother     Arthritis Paternal Grandmother     Depression Paternal Grandmother     Diabetes Mother     Heart disease Mother     Migraines Mother     Arthritis Mother     Depression Mother     GER disease Mother     Hypertension Mother     Angina Mother     COPD Mother     Glaucoma Mother     Hearing loss Mother     Diabetes Sister  Migraines Sister     Asthma Sister     Depression Sister     Hearing loss Sister     Diabetes Brother     Asthma Brother     Diabetes Maternal Grandmother     Heart disease Maternal Grandmother     Migraines Maternal Grandmother     Depression Maternal Grandmother     Angina Maternal Grandmother     Hypertension Maternal Grandmother     Stroke Maternal Grandmother     Diabetes Maternal Uncle     Hearing loss Maternal Uncle     Diabetes Maternal Uncle         resulted in need for kidney transplant    Liver disease Maternal Uncle     Kidney disease Maternal Uncle         needed kidney transplant    Asthma Brother     No Known Problems Father     No Known Problems Paternal Aunt     No Known Problems Paternal Uncle     No Known Problems Other     Colorectal Cancer Neg Hx     Esophageal cancer Neg Hx     Liver cancer Neg Hx     Pancreatic cancer Neg Hx     Stomach cancer Neg Hx     Amblyopia Neg Hx     Blindness Neg Hx     Retinal detachment Neg Hx     Strabismus Neg Hx     Macular degeneration Neg Hx     Anesthesia problems Neg Hx     Broken bones Neg Hx     Clotting disorder Neg Hx     Collagen disease Neg Hx Dislocations Neg Hx     Fibromyalgia Neg Hx     Gout Neg Hx     Hemophilia Neg Hx     Osteoporosis Neg Hx     Rheumatologic disease Neg Hx     Scoliosis Neg Hx     Severe sprains Neg Hx     Sickle cell anemia Neg Hx     Spinal Compression Fracture Neg Hx     Melanoma Neg Hx     Basal cell carcinoma Neg Hx     Squamous cell carcinoma Neg Hx     Deep vein thrombosis Neg Hx     Cataracts Neg Hx     Thyroid disease Neg Hx        Social History:     Social History     Tobacco Use    Smoking status: Never    Smokeless tobacco: Never   Vaping Use    Vaping Use: Never used   Substance Use Topics    Alcohol use: Not Currently     Comment: rare social    Drug use: Never       Allergies:   Erythromycin, Penicillins, Sulfa (sulfonamide antibiotics), and Other    Current Medications:     Current Outpatient Medications   Medication Sig Dispense Refill    acetaminophen (TYLENOL) 500 MG tablet Take 2 tablets (1,000 mg total) by mouth Three (3) times a day. (Patient taking differently: Take 1 tablet (500 mg total) by mouth Three (3) times a day.) 30 tablet 0    adalimumab (HUMIRA,CF, PEN) 80 mg/0.8 mL PnKt Inject the contents of 1 pen (80mg ) under the skin once a week. 4 each 11    albuterol 2.5 mg /3 mL (0.083 %) nebulizer solution Inhale 3 mL (2.5 mg  total) by nebulization every four (4) hours as needed for wheezing. 180 mL 2    albuterol HFA 90 mcg/actuation inhaler Inhale 2 puffs every six (6) hours as needed for wheezing. 8 g 5    ascorbic acid (VITAMIN C ORAL) Take 1 capsule by mouth nightly.      blood sugar diagnostic (ACCU-CHEK GUIDE TEST STRIPS) Strp by Other route Three (3) times a day before meals. 300 strip 1    blood sugar diagnostic (ACCU-CHEK GUIDE TEST STRIPS) Strp Accu-Chek Guide test strips   USE TO CHECK BLOOD SUGAR 3 TIMES DAILY BEFORE MEALS      blood-glucose meter kit Use as directed 1 each 0    budesonide-formoteroL (SYMBICORT) 80-4.5 mcg/actuation inhaler Inhale 2 puffs Two (2) times a day. 10.2 g 11 clindamycin (CLEOCIN T) 1 % external solution clindamycin phosphate 1 % topical solution   APPLY TOPICALLY TO GROIN AREA TWICE A DAY      clindamycin (CLEOCIN T) 1 % lotion APPLY TOPICALLY AT NIGHT TO INFLAMED AREAS AS NEEDED FOR FLARES      clobetasoL (TEMOVATE) 0.05 % ointment Apply daily to painful affected areas if needed for flares, then stop 15 g 1    colchicine (MITIGARE) 0.6 mg cap capsule TAKE 2 CAPSULES BY MOUTH ON DAY ONE, FOLLOWED BY ONE CAPSULE ONE HOUR LATER, ON DAY 2 TAKE ONE CAPSULE BY MOUTH TWICEA DAY UNTIL RESOLVED 60 capsule 0    cyclobenzaprine (FLEXERIL) 10 MG tablet Take 1 tablet (10 mg total) by mouth Three (3) times a day as needed for muscle spasms. 60 tablet 1    dextroamphetamine-amphetamine (ADDERALL XR) 20 MG 24 hr capsule Take 1 capsule (20 mg total) by mouth every morning.      dicyclomine (BENTYL) 10 mg capsule Take 1 capsule (10 mg total) by mouth Three (3) times a day. 270 capsule 1    diphenoxylate-atropine (LOMOTIL) 2.5-0.025 mg per tablet Take 1 tablet by mouth two (2) times a day as needed for diarrhea. 30 tablet 5    dulaglutide (TRULICITY) 1.5 mg/0.5 mL PnIj Inject 0.5 mL (1.5 mg total) under the skin every seven (7) days. 2 mL 5    DULoxetine (CYMBALTA) 60 MG capsule Take 1 capsule (60 mg total) by mouth Two (2) times a day. 180 capsule 0    empty container (SHARPS CONTAINER) Misc Use as directed to dispose of Humira needles 1 each 2    empty container Misc Use as directed 1 each 2    erenumab-aooe (AIMOVIG AUTOINJECTOR) 140 mg/mL AtIn Inject 140 mg under the skin every thirty (30) days. 3 mL 1    famotidine (PEPCID) 20 MG tablet Take 1 tablet (20 mg total) by mouth Two (2) times a day. 180 tablet 2    fluoride, sodium, 1.1 % Pste       fluticasone propionate (FLONASE) 50 mcg/actuation nasal spray 1 spray into each nostril Two (2) times a day. 16 g 5    glipiZIDE (GLUCOTROL) 10 MG tablet TAKE TWO TABLETS BY MOUTH TWICE A DAY BEFORE A MEAL 120 tablet 1    HUMALOG U-100 INSULIN 100 unit/mL injection INJECT 120 UNITS UNDER THE SKIN DAILY VIA OMNIPOD 40 mL 5    hydroCHLOROthiazide (HYDRODIURIL) 25 MG tablet Take 1 tablet (25 mg total) by mouth daily. 90 tablet 3    hydrocortisone (PROCTOSOL HC) 2.5 % rectal cream Insert 1 application into the rectum two (2) times a day as needed. 28.35 g 2  hydrOXYzine (ATARAX) 25 MG tablet Take 1 tablet (25 mg total) by mouth Three (3) times a day as needed. (Patient taking differently: Take 1 tablet (25 mg total) by mouth four (4) times a day as needed.) 120 tablet 1    ibuprofen (ADVIL,MOTRIN) 800 MG tablet Take 1 tablet (800 mg total) by mouth every eight (8) hours as needed. 90 tablet 1    insulin pump cartridge Crtg Inject 120 Units under the skin daily. (Patient taking differently: Inject 200 Units under the skin. About every 2-3 days) 5 each 5    insulin syringe-needle U-100 1 mL 31 gauge x 5/16 (8 mm) Syrg insulin syringe U-100 with needle 1 mL 31 gauge x 5/16   USE UP TO 5 TIMES A DAY TO ADMINISTER INSULIN      ketoconazole (NIZORAL) 2 % cream Apply 1 application topically daily. To the affected area of the groin 60 g 11    lancets (ACCU-CHEK FASTCLIX LANCET DRUM) Misc Use to check blood sugars 3 times a day before meals or as directed 200 each 5    levothyroxine (SYNTHROID) 50 MCG tablet Take 1 tablet (50 mcg total) by mouth daily. 90 tablet 1    lidocaine (XYLOCAINE) 5 % ointment lidocaine 5 % topical ointment      montelukast (SINGULAIR) 10 mg tablet Take 1 tablet (10 mg total) by mouth daily. 90 tablet 3    multivitamin (MULTIPLE VITAMIN ORAL) Take 1 tablet by mouth daily.      ondansetron (ZOFRAN-ODT) 4 MG disintegrating tablet Take 1 tablet (4 mg total) by mouth every eight (8) hours as needed for nausea for up to 15 doses. 15 tablet 1    oxybutynin (DITROPAN) 5 MG tablet TAKE 1 TABLET BY MOUTH TWICE A DAY 60 tablet 1    pantoprazole (PROTONIX) 40 MG tablet Take 1 tablet (40 mg total) by mouth Two (2) times a day. 180 tablet 1    polyethylene glycol (GLYCOLAX) 17 gram/dose powder take 17GM (DISSOLVED IN WATER) by mouth once daily      propranoloL (INDERAL LA) 120 mg 24 hr capsule Take 1 capsule (120 mg total) by mouth in the morning. 90 capsule 0    ramelteon (ROZEREM) 8 mg tablet Take 1 tablet (8 mg total) by mouth nightly.      sour cherry extract (TART CHERRY EXTRACT) 1,000 mg cap       topiramate (TOPAMAX) 50 MG tablet Take 1.5 tablets (75 mg total) by mouth Two (2) times a day. 270 tablet 1     No current facility-administered medications for this visit.       Health Maintenance:     Health Maintenance   Topic Date Due    COVID-19 Vaccine (3 - Pfizer risk series) 01/24/2021    Retinal Eye Exam  09/20/2021    Urine Albumin/Creatinine Ratio  10/11/2021    Foot Exam  01/09/2022    Hemoglobin A1c  01/10/2022    Serum Creatinine Monitoring  01/18/2022    Potassium Monitoring  01/18/2022    DTaP/Tdap/Td Vaccines (4 - Td or Tdap) 07/17/2023    COPD Spirometry  08/06/2026    Pneumococcal Vaccine 0-64  Completed    Hepatitis C Screen  Completed    Influenza Vaccine  Completed       Immunizations:     Immunization History   Administered Date(s) Administered    COVID-19 VAC,MRNA,TRIS(12Y UP)(PFIZER)(GRAY CAP) 12/04/2020, 12/27/2020    COVID-19 VACC,MRNA,(PFIZER)(PF) 12/04/2020, 12/27/2020    Hepatitis B  Vaccine, Unspecified Formulation 12/27/1999, 04/29/2000    Hepatitis B, Adult 03/05/2013, 04/05/2013, 09/03/2013    INFLUENZA INJ MDCK PF, QUAD,(FLUCELVAX)(33MO AND UP EGG FREE) 03/31/2019    INFLUENZA TIV (TRI) PF (IM) 10/08/2011    Influenza Vaccine Quad (IIV4 PF) 24mo+ injectable 03/05/2013, 03/18/2014, 03/22/2015, 03/05/2016, 03/19/2017, 02/23/2018    Influenza Virus Vaccine, unspecified formulation 03/19/2017, 02/23/2018, 03/25/2019, 03/10/2020, 04/05/2021    PNEUMOCOCCAL POLYSACCHARIDE 23 12/30/2012    PPD Test 10/28/2016    TdaP 07/18/2008, 07/16/2013    Tetanus-Diptheria Toxoids-TD(TDVAX),Asdorbed,2LF(IM) 04/29/2000     I have reviewed and (if needed) updated the patient's problem list, medications, allergies, past medical and surgical history, social and family history.     Vital Signs:     Wt Readings from Last 3 Encounters:   08/06/21 (!) 219.1 kg (483 lb)   07/13/21 (!) 226.8 kg (500 lb)   04/12/21 (!) 223.2 kg (492 lb)     Temp Readings from Last 3 Encounters:   08/06/21 36.7 ??C (98.1 ??F) (Temporal)   07/13/21 37.1 ??C (98.7 ??F) (Oral)   04/12/21 36.7 ??C (98 ??F) (Oral)     BP Readings from Last 3 Encounters:   08/06/21 115/87   07/13/21 148/84   04/12/21 142/82     Pulse Readings from Last 3 Encounters:   08/06/21 110   07/13/21 73   04/12/21 68     Estimated body mass index is 69.11 kg/m?? as calculated from the following:    Height as of 08/06/21: 178.1 cm (5' 10.1).    Weight as of 08/06/21: 219.1 kg (483 lb).  No height and weight on file for this encounter.        Objective:   General: Alert and oriented x3. Well-appearing. No acute distress. ***  HEENT:  Normocephalic.  Atraumatic. Conjunctiva and sclera normal. OP MMM without lesions. ***  Neck:  Supple. No thyroid enlargement. No adenopathy. ***  Heart:  Regular rate and rhythm. Normal S1, S2. No murmurs, rubs or gallops. ***  Lungs:  No respiratory distress.  Lungs clear to auscultation. No wheezes, rhonchi, or rales. ***  GI/GU:  Soft, +BS, nondistended, non-TTP. No palpable masses or organomegaly. ***  Extremities:  No edema. Peripheral pulses normal. ***  Skin:  Warm, dry. No rash or lesions present. ***  Neuro:  Non-focal. No obvious weakness. ***  Psych:  Affect normal, eye contact good, speech clear and coherent. ***      I attest that I, Vergia Alberts, personally documented this note while acting as scribe for [name], FNP.      Vergia Alberts, Scribe.  09/11/2021     The documentation recorded by the scribe accurately reflects the service I personally performed and the decisions made by me.    [name], FNP

## 2021-09-11 NOTE — Unmapped (Signed)
Left message on Logan Quinn's personal voice mail, we have opening at 2:40 today and he can check in by phone 15 minutes prior.  If he can't make, it he needs to let us know ASAP so we can offer to another patient.

## 2021-09-12 MED ORDER — PROPRANOLOL ER 120 MG CAPSULE,24 HR,EXTENDED RELEASE
ORAL_CAPSULE | Freq: Every day | ORAL | 0 refills | 90 days
Start: 2021-09-12 — End: 2022-09-12

## 2021-09-13 MED ORDER — PROPRANOLOL ER 120 MG CAPSULE,24 HR,EXTENDED RELEASE
ORAL_CAPSULE | Freq: Every day | ORAL | 1 refills | 90 days | Status: CP
Start: 2021-09-13 — End: 2022-09-13

## 2021-09-18 DIAGNOSIS — N3281 Overactive bladder: Principal | ICD-10-CM

## 2021-09-18 DIAGNOSIS — N3944 Nocturnal enuresis: Principal | ICD-10-CM

## 2021-09-18 MED ORDER — ACCU-CHEK GUIDE TEST STRIPS
Freq: Three times a day (TID) | 1 refills | 0 days | Status: CP
Start: 2021-09-18 — End: 2022-09-18

## 2021-09-18 MED ORDER — OXYBUTYNIN CHLORIDE 5 MG TABLET
ORAL_TABLET | Freq: Two times a day (BID) | ORAL | 1 refills | 90 days | Status: CP
Start: 2021-09-18 — End: 2022-09-18

## 2021-09-18 MED ORDER — GLIPIZIDE 10 MG TABLET
ORAL_TABLET | Freq: Two times a day (BID) | ORAL | 1 refills | 90 days | Status: CP
Start: 2021-09-18 — End: 2022-09-18

## 2021-09-20 MED ORDER — FAMOTIDINE 20 MG TABLET
ORAL_TABLET | Freq: Two times a day (BID) | ORAL | 2 refills | 90.00000 days | Status: CP
Start: 2021-09-20 — End: 2022-09-20

## 2021-09-20 NOTE — Unmapped (Signed)
[facility] Refill Coordination Note    Specialty Medication(s) to be Shipped:   Inflammatory Disorders: Humira     Other medication(s) to be shipped: No additional medications requested for fill at this time     Logan Quinn, DOB: May 14, 1981  Phone: [phone] (home)     All above HIPAA information was verified with patient.     Was a Nurse, learning disability used for this call? No    Completed refill call assessment today to schedule patient's medication shipment from the [facility] [phone]).  All relevant notes have been reviewed.     Specialty medication(s) and dose(s) confirmed: Regimen is correct and unchanged.   Changes to medications: Logan Quinn reports no changes at this time.  Changes to insurance: No  New side effects reported not previously addressed with a pharmacist or physician: None reported  Questions for the pharmacist: No    Confirmed patient received a Conservation officer, historic buildings and a Surveyor, mining with first shipment. The patient will receive a drug information handout for each medication shipped and additional FDA Medication Guides as required.       DISEASE/MEDICATION-SPECIFIC INFORMATION        For patients on injectable medications: Patient currently has 2 doses left.  Next injection is scheduled for 09/20/2021 & 09/27/2021.    SPECIALTY MEDICATION ADHERENCE     Medication Adherence    Patient reported X missed doses in the last month: 0  Specialty Medication: Humira  Patient is on additional specialty medications: No  Any gaps in refill history greater than 2 weeks in the last 3 months: no  Demonstrates understanding of importance of adherence: yes  Informant: patient  Reliability of informant: reliable  Confirmed plan for next specialty medication refill: delivery by pharmacy  Refills needed for supportive medications: not needed        Were doses missed due to medication being on hold? No    Humira CF 80mg /0.67ml Inj: 14 days of medicine on hand REFERRAL TO PHARMACIST     Referral to the pharmacist: Not needed    [facility]     Shipping address confirmed in Epic.     Delivery Scheduled: Yes, Expected medication delivery date: 09/26/2021.     Medication will be delivered via Same Day Courier to the prescription address in Epic WAM.    Logan Quinn D Logan Quinn   [facility] Specialty Technician

## 2021-09-26 MED FILL — HUMIRA(CF) PEN 80 MG/0.8 ML SUBCUTANEOUS KIT: SUBCUTANEOUS | 28 days supply | Qty: 4 | Fill #2

## 2021-09-27 NOTE — Unmapped (Signed)
[place] Assessment of Medications Program (CAMP)  RECRUITMENT SUMMARY NOTE   COPD Inhaler Education      CAMP is a team of pharmacists and pharmacy technicians within [facility] that supports patients and providers.     Patient was identified for CAMP services based on criteria including, 40 years of age or older, with a recent managing provider visit, and   Inhaler education needed     A letter has been sent explaining program services.        Logan Quinn, CPhT   Certified Pharmacy Technician   [place] Assessment of Medications Program (CAMP)   P [phone]) [phone], F [phone]

## 2021-09-28 NOTE — Unmapped (Unsigned)
Assessment and Plan:      Diagnosis ICD-10-CM Associated Orders   1. Type 2 diabetes mellitus with hypoglycemia without coma, with long-term current use of insulin (CMS-HCC)  [phone] 41 yo male came in for his follow up appointment.     HGB A1c 7.3 (unchanged from 2 months ago). DM reasonably controlled. Continue glipizide 10 mg twice daily before meals,  Trulicity 1.5 mg weekly, and Humalog insulin via Omnipod. Encouraged patient to continue carb controlled diet and regular exercise.     POCT glycosylated hemoglobin (Hb A1C)    Z79.4 insulin pump cartridge Crtg     POCT glycosylated hemoglobin (Hb A1C)     Albumin/creatinine urine ratio      2. Essential hypertension  I10 BP at goal (130/78 in clinic today). Continue hydrochlorothiazide 25 mg daily. Reviewed low sodium diet and encouraged regular exercise. Advised to continue to monitor and log at-home BP readings. Contact clinic if readings become elevated.      3. Acquired hypothyroidism  E03.9 Hypothyroidism is stable. Will continue to monitor.       4. Recurrent major depressive disorder, in partial remission (CMS-HCC)  F33.41 Patient following closely with psychiatry, [facility].      5. Mild intermittent asthma without complication  J45.20 Asthma controlled. Continue daily maintenance with  Symbicort BID, Singulair 10 mg daily  and rescue inhaler, albuterol, as needed.         I personally spent 40 minutes face-to-face and non-face-to-face in the care of this patient, which includes all pre, intra, and post visit time on the date of service.    Return in about 3 months (around 01/10/2022) for Next scheduled follow up.    HPI:     Logan Quinn is here for   Chief Complaint   Patient presents with    Diabetes     He will schedule his diabetic eye exam.    Hypothyroidism   Pt was last seen by me 09/11/21, for pansinuitis, new onset.     Diabetes: Patient presents for follow up of diabetes.  A1C goal is <8.  Diabetes has customarily not been at goal Current symptoms include: polydipsia. Symptoms have stabilized. Patient denies foot ulcerations, hyperglycemia, hypoglycemia , increased appetite, nausea, paresthesia of the feet, polyuria, visual disturbances, vomiting, and weight loss. Evaluation to date has included: hemoglobin A1C.  Home sugars: BGs consistently in an acceptable range. Current treatment: Continued glipizide and Trulicity  which has been somewhat effective.  Doing regular exercise: no.  Patient had sclerae therapy on his LEs 10/09/21 and has some residual pain.     Hypertension: Patient presents for follow-up of hypertension. Blood pressure goal < 140/90.  Hypertension has customarily not been at goal complicated by obesity, diabetes mellitus, and hypothyroidism.  Home blood pressure readings: did not bring log. Salt intake and diet: salt not added to cooking and salt shaker not on table. Associated signs and symptoms: peripheral edema. Patient denies: blurred vision, chest pain, dyspnea, headache, neck aches, orthopnea, palpitations, paroxysmal nocturnal dyspnea, pulsating in the ears, and tiredness/fatigue. Medication compliance: taking as prescribed. He is not doing regular exercise.      Hypothyroidism: Patient presents for follow-up of hypothyroidism. Current symptoms: heat / cold intolerance. Patient denies heat / cold intolerance. Symptoms have stabilized. He is taking medications on a regular basis.         Patient has not required emergency room treatment for these symptoms, and has not required hospitalization.  Denies HA, fever, chest pain, shortness of breath, N/V/D, bowel or bladder issues, vision or hearing changes, or swelling.     ROS:      Comprehensive 10 point ROS negative unless otherwise stated in the HPI.      [facility] Components:     Medication adherence and barriers to the treatment plan have been addressed. Opportunities to optimize healthy behaviors have been discussed. Patient / caregiver voiced understanding.    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)     Obstructive sleep apnea     OSA on CPAP     Severe obstructive sleep apnea     Trapezius muscle strain 12/07/2013    Urinary incontinence, nocturnal enuresis     Venous insufficiency      Past Surgical History:   Procedure Laterality Date    EYE SURGERY  11/15    PR COLONOSCOPY FLX DX W/COLLJ SPEC WHEN PFRMD N/A 03/24/2013    Procedure: COLONOSCOPY, FLEXIBLE, PROXIMAL TO SPLENIC FLEXURE; DIAGNOSTIC, W/WO COLLECTION SPECIMEN BY BRUSH OR WASH;  Surgeon: Clint Bolder, MD;  [facility];  Service: Gastroenterology    PR EYE SURG POST SGMT PROC UNLISTED Left     pneumatic retinopexy OS    PR UPPER GI ENDOSCOPY,DIAGNOSIS N/A 02/02/2013    Procedure: UGI ENDO, INCLUDE ESOPHAGUS, STOMACH, & DUODENUM &/OR JEJUNUM; DX W/WO COLLECTION SPECIMN, BY BRUSH OR WASH;  Surgeon: Malcolm Metro, MD;  Location: [facility];  Service: Gastroenterology    [place] PYLORIC STENOSIS ([place] HISTORICAL RESULT)         Family History:     Family History   Problem Relation Age of Onset    Cancer Maternal Grandfather     Hearing loss Maternal Grandfather     Cancer Paternal Grandfather     COPD Paternal Grandmother     Arthritis Paternal Grandmother     Depression Paternal Grandmother     Diabetes Mother     Heart disease Mother     Migraines Mother     Arthritis Mother     Depression Mother     GER disease Mother     Hypertension Mother     Angina Mother     COPD Mother     Glaucoma Mother     Hearing loss Mother     Diabetes Sister     Migraines Mother     COPD Mother     Glaucoma Mother     Hearing loss Mother     Diabetes Sister     Migraines Sister     Asthma Sister     Depression Sister     Hearing loss Sister     Diabetes Brother     Asthma Brother     Diabetes Maternal Grandmother     Heart disease Maternal Grandmother  Migraines Maternal Grandmother     Depression Maternal Grandmother     Angina Maternal Grandmother     Hypertension Maternal Grandmother     Stroke Maternal Grandmother     Diabetes Maternal Uncle     Hearing loss Maternal Uncle     Diabetes Maternal Uncle         resulted in need for kidney transplant    Liver disease Maternal Uncle     Kidney disease Maternal Uncle         needed kidney transplant    Asthma Brother     No Known Problems Father     No Known Problems Paternal Aunt     No Known Problems Paternal Uncle     No Known Problems Other     Colorectal Cancer Neg Hx     Esophageal cancer Neg Hx     Liver cancer Neg Hx     Pancreatic cancer Neg Hx     Stomach cancer Neg Hx     Amblyopia Neg Hx     Blindness Neg Hx     Retinal detachment Neg Hx     Strabismus Neg Hx     Macular degeneration Neg Hx     Anesthesia problems Neg Hx     Broken bones Neg Hx     Clotting disorder Neg Hx     Collagen disease Neg Hx     Dislocations Neg Hx     Fibromyalgia Neg Hx     Gout Neg Hx     Hemophilia Neg Hx     Osteoporosis Neg Hx     Rheumatologic disease Neg Hx     Scoliosis Neg Hx     Severe sprains Neg Hx     Sickle cell anemia Neg Hx     Spinal Compression Fracture Neg Hx     Melanoma Neg Hx     Basal cell carcinoma Neg Hx     Squamous cell carcinoma Neg Hx     Deep vein thrombosis Neg Hx     Cataracts Neg Hx     Thyroid disease Neg Hx        Social History:     Social History     Tobacco Use    Smoking status: Never    Smokeless tobacco: Never   Vaping Use    Vaping Use: Never used   Substance Use Topics    Alcohol use: Not Currently     Comment: rare social    Drug use: Never       Allergies:     Erythromycin, Penicillins, Sulfa (sulfonamide antibiotics), and Other    Current Medications:     Current Outpatient Medications   Medication Sig Dispense Refill    acetaminophen (TYLENOL) 500 MG tablet Take 2 tablets (1,000 mg total) by mouth Three (3) times a day. (Patient taking differently: Take 1 tablet (500 mg total) by mouth Three (3) times a day.) 30 tablet 0    adalimumab (HUMIRA,CF, PEN) 80 mg/0.8 mL PnKt Inject the contents of 1 pen (80mg ) under the skin once a week. 4 each 11    albuterol HFA 90 mcg/actuation inhaler Inhale 2 puffs every six (6) hours as needed for wheezing. 8 g 5    ascorbic acid (VITAMIN C ORAL) Take 1 capsule by mouth nightly.      blood sugar diagnostic (ACCU-CHEK GUIDE TEST STRIPS) Strp by Other route Three (3) times a day before meals. 300 each 1  blood-glucose meter kit Use as directed 1 each 0    budesonide-formoteroL (SYMBICORT) 80-4.5 mcg/actuation inhaler Inhale 2 puffs Two (2) times a day. 10.2 g 11    clindamycin (CLEOCIN T) 1 % external solution clindamycin phosphate 1 % topical solution   APPLY TOPICALLY TO GROIN AREA TWICE A DAY      clindamycin (CLEOCIN T) 1 % lotion APPLY TOPICALLY AT NIGHT TO INFLAMED AREAS AS NEEDED FOR FLARES      clobetasoL (TEMOVATE) 0.05 % ointment Apply daily to painful affected areas if needed for flares, then stop 15 g 1    cyclobenzaprine (FLEXERIL) 10 MG tablet Take 1 tablet (10 mg total) by mouth Three (3) times a day as needed for muscle spasms. 60 tablet 1    dextroamphetamine-amphetamine (ADDERALL XR) 20 MG 24 hr capsule Take 1 capsule (20 mg total) by mouth every morning.      dicyclomine (BENTYL) 10 mg capsule Take 1 capsule (10 mg total) by mouth Three (3) times a day. 270 capsule 1    diphenoxylate-atropine (LOMOTIL) 2.5-0.025 mg per tablet Take 1 tablet by mouth two (2) times a day as needed for diarrhea. 30 tablet 5    dulaglutide (TRULICITY) 1.5 mg/0.5 mL PnIj Inject 0.5 mL (1.5 mg total) under the skin every seven (7) days. 2 mL 5    DULoxetine (CYMBALTA) 60 MG CONTAINER) Misc Use as directed to dispose of Humira needles 1 each 2    erenumab-aooe (AIMOVIG AUTOINJECTOR) 140 mg/mL AtIn Inject 140 mg under the skin every thirty (30) days. 3 mL 1    famotidine (PEPCID) 20 MG tablet Take 1 tablet (20 mg total) by mouth Two (2) times a day. 180 tablet 1    fluoride, sodium, 1.1 % Pste       fluticasone propionate (FLONASE) 50 mcg/actuation nasal spray 1 spray into each nostril Two (2) times a day. 16 g 5    glipiZIDE (GLUCOTROL) 10 MG tablet Take 2 tablets (20 mg total) by mouth Two (2) times a day (30 minutes before a meal). 360 tablet 1    HUMALOG U-100 INSULIN 100 unit/mL injection INJECT 120 UNITS UNDER THE SKIN DAILY VIA OMNIPOD 40 mL 5    hydroCHLOROthiazide (HYDRODIURIL) 25 MG tablet Take 1 tablet (25 mg total) by mouth daily. 90 tablet 3    hydrocortisone (PROCTOSOL HC) 2.5 % rectal cream Insert 1 application into the rectum two (2) times a day as needed. 28.35 g 2    hydrOXYzine (ATARAX) 25 MG tablet Take 1 tablet (25 mg total) by mouth Three (3) times a day as needed. (Patient taking differently: Take 1 tablet (25 mg total) by mouth four (4) times a day as needed.) 120 tablet 1    insulin syringe-needle U-100 1 mL 31 gauge x 5/16 (8 mm) Syrg       ketoconazole (NIZORAL) 2 % cream Apply 1 application topically daily. To the affected area of the groin 60 g 11    lancets (ACCU-CHEK FASTCLIX LANCET DRUM) Misc Use to check blood sugars 3 times a day before meals or as directed 200 each 5    lidocaine (XYLOCAINE) 5 % ointment lidocaine 5 % topical ointment      montelukast (SINGULAIR) 10 mg tablet Take 1 tablet (10 mg total) by mouth daily. 90 tablet 3    multivitamin (MULTIPLE VITAMIN ORAL) Take 1 tablet by mouth daily.      oxybutynin (DITROPAN) 5 MG tablet Take 1 tablet (5 mg total) by  mouth Two (2) times a day. 180 tablet 1    pantoprazole (PROTONIX) 40 MG tablet Take 1 tablet (40 mg total) by mouth Two (2) times a day. 180 tablet 1    polyethylene glycol (GLYCOLAX) 17 gram/dose powder take 17GM (DISSOLVED IN WATER) by mouth once daily      propranoloL (INDERAL LA) 120 mg 24 hr capsule Take 1 capsule (120 mg total) by mouth daily. 90 capsule 1    ramelteon (ROZEREM) 8 mg tablet Take 1 tablet (8 mg total) by mouth nightly.      sour cherry extract (TART CHERRY EXTRACT) 1,000 mg cap       topiramate (TOPAMAX) 50 MG tablet Take 1.5 tablets (75 mg total) by mouth Two (2) times a day. 270 tablet 1    albuterol 2.5 mg /3 mL (0.083 %) nebulizer solution Inhale 3 mL (2.5 mg total) by nebulization every four (4) hours as needed for wheezing. (Patient not taking: Reported on 10/10/2021) 180 mL 2    colchicine (MITIGARE) 0.6 mg cap capsule TAKE 2 CAPSULES BY MOUTH ON DAY ONE, FOLLOWED BY ONE CAPSULE ONE HOUR LATER, ON DAY 2 TAKE ONE CAPSULE BY MOUTH TWICEA DAY UNTIL RESOLVED (Patient not taking: Reported on 10/10/2021) 60 capsule 0    ibuprofen (ADVIL,MOTRIN) 800 MG tablet Take 1 tablet (800 mg total) by mouth every eight (8) hours as needed. (Patient not taking: Reported on 10/10/2021) 90 tablet 1    insulin pump cart,automated,BT (OMNIPOD 5 G6 PODS, GEN 5,) Crtg Change POD every 48 hours. 3 each 11    insulin pump cartridge Crtg Inject 200 Units under the skin every other day. Change every 48 hours 15 each 11    levothyroxine (SYNTHROID) 50 MCG tablet Take 1 tablet (50 mcg total) by mouth daily. 90 tablet 1    ondansetron (ZOFRAN-ODT) 4 MG disintegrating tablet Take 1 tablet (4 mg total) by mouth every eight (8) hours as needed for nausea for up to 15 doses. (Patient not taking: Reported on 10/10/2021) 15 tablet 1     No current facility-administered medications for this visit.       Health Maintenance:     Health Maintenance   Topic Date Due    COVID-19 Vaccine (3 - Pfizer risk series) 01/24/2021    Retinal Eye Exam  09/20/2021    Foot Exam  01/09/2022    Serum Creatinine Monitoring  01/18/2022    Potassium Monitoring  01/18/2022    Hemoglobin A1c  04/12/2022    Urine Albumin/Creatinine Ratio 10/11/2022    DTaP/Tdap/Td Vaccines (4 - Td or Tdap) 07/17/2023    COPD Spirometry  08/06/2026    Pneumococcal Vaccine 0-64  Completed    Hepatitis C Screen  Completed    Influenza Vaccine  Completed       Immunizations:     Immunization History   Administered Date(s) Administered    COVID-19 VACC,MRNA,(PFIZER)(PF) 12/04/2020, 12/27/2020    Hepatitis B Vaccine, Unspecified Formulation 12/27/1999, 04/29/2000    Hepatitis B, Adult 03/05/2013, 04/05/2013, 09/03/2013    INFLUENZA INJ MDCK PF, QUAD,(FLUCELVAX)(33MO AND UP EGG FREE) 03/31/2019    INFLUENZA TIV (TRI) PF (IM) 10/08/2011    Influenza Vaccine Quad (IIV4 PF) 23mo+ injectable 03/05/2013, 03/18/2014, 03/22/2015, 03/05/2016, 03/19/2017, 02/23/2018    Influenza Virus Vaccine, unspecified formulation 03/19/2017, 02/23/2018, 03/25/2019, 03/10/2020, 04/05/2021    PNEUMOCOCCAL POLYSACCHARIDE 23 12/30/2012    PPD Test 10/28/2016    TdaP 07/18/2008, 07/16/2013    Tetanus-Diptheria Toxoids-TD(TDVAX),Asdorbed,2LF(IM) 04/29/2000  I have reviewed and (if needed) updated the patient's problem list, medications, allergies, past medical and surgical history, social and family history.     Vital Signs:     Wt Readings from Last 3 Encounters:   10/10/21 (!) 224.5 kg (495 lb)   09/11/21 (!) 225.4 kg (497 lb)   08/06/21 (!) 219.1 kg (483 lb)     Temp Readings from Last 3 Encounters:   10/10/21 36.8 ??C (98.2 ??F) (Oral)   09/11/21 36.9 ??C (98.5 ??F) (Oral)   08/06/21 36.7 ??C (98.1 ??F) (Temporal)     BP Readings from Last 3 Encounters:   10/10/21 130/78   09/11/21 118/72   08/06/21 115/87     Pulse Readings from Last 3 Encounters:   10/10/21 72   09/11/21 77   08/06/21 110     Estimated body mass index is 70.79 kg/m?? as calculated from the following:    Height as of this encounter: 178.1 cm (5' 10.12).    Weight as of this encounter: 224.5 kg (495 lb).  Facility age limit for growth percentiles is 20 years.    Objective:     General: Alert and oriented x3. Well-appearing. No acute distress. Morbidly obese. ***  HEENT:  Normocephalic.  Atraumatic. Conjunctiva and sclera normal. OP MMM without lesions. ***  Neck:  Supple. No thyroid enlargement. No adenopathy. ***  Heart:  Regular rate and rhythm. Normal S1, S2. No murmurs, rubs or gallops.   Lungs:  No respiratory distress.  Lungs clear to auscultation. No wheezes, rhonchi, or rales.   GI/GU:  Soft, +BS, nondistended, non-TTP. No palpable masses or organomegaly. ***  Extremities:  Peripheral pulses normal. Edema bilaterally in lower extremities.  ***  Skin:  Warm, dry. No rash or lesions present.   Neuro:  Non-focal. No obvious weakness.   Psych:  Affect normal, eye contact good, speech clear and coherent.       I attest that I, Vergia Alberts, personally documented this note while acting as scribe for [name], FNP.      Vergia Alberts, Scribe.  10/10/2021     The documentation recorded by the scribe accurately reflects the service I personally performed and the decisions made by me.    [name], FNP

## 2021-10-01 DIAGNOSIS — I10 Essential (primary) hypertension: Principal | ICD-10-CM

## 2021-10-01 MED ORDER — HYDROCHLOROTHIAZIDE 25 MG TABLET
ORAL_TABLET | Freq: Every day | ORAL | 3 refills | 90 days
Start: 2021-10-01 — End: 2022-10-01

## 2021-10-01 NOTE — Unmapped (Signed)
Per verbal from Pauline, he should be on the hydrochlorothiazide 25 mg daily.  Dawn at [employer] notified.

## 2021-10-01 NOTE — Unmapped (Signed)
Dawn with [employer] [phone].  We sent in RX for hydrochlorothiazide 25 mg #90 and 3 refills on 04/12/2021.  [facility] Client sent HCTZ 12.5 mg in on 06/06/2021 for #90.   Logan Quinn upset with them because he said he is suppose to be on 25 mg which is what we have in his current med list and your note.

## 2021-10-08 NOTE — Unmapped (Signed)
[place] Assessment of Medications Program (CAMP)  RECRUITMENT SUMMARY NOTE   COPD Inhaler Education      Patient was outreached for [employer]. Patient declined.  [place]    Video Visit Scheduling Checklist:   COS has verified the following with the patient/patient representative:   []  Verify access to device with camera (smartphone/tablet or computer with webcam)   []  MyChart > Verify patient/patient representative is able access MyChart  (add MyChart in appt note)  []  Link > Verify e-mail address AND/OR cell phone number with patient    []  Patient wants link sent to cell phone (add text link in appt note)   []  Patient wants link sent to e-mail  (add e-mail link in appt note)  []  Verify patients current inhalers on hand (symbicort ,albuterol)  Troubleshooting (MyChart access):  []  Provide patient with [facility] contact information for additional support- [phone]        Jerolyn Center, CPhT   Certified Pharmacy Technician   [place] Assessment of Medications Program (CAMP)   P [phone], F [phone]

## 2021-10-09 ENCOUNTER — Ambulatory Visit (INDEPENDENT_AMBULATORY_CARE_PROVIDER_SITE_OTHER): Payer: Medicare Other | Admitting: Nurse Practitioner

## 2021-10-09 ENCOUNTER — Encounter (INDEPENDENT_AMBULATORY_CARE_PROVIDER_SITE_OTHER): Payer: Self-pay | Admitting: Nurse Practitioner

## 2021-10-09 VITALS — BP 149/75 | HR 73 | Resp 16 | Wt >= 6400 oz

## 2021-10-09 DIAGNOSIS — I83813 Varicose veins of bilateral lower extremities with pain: Secondary | ICD-10-CM

## 2021-10-10 ENCOUNTER — Ambulatory Visit: Admit: 2021-10-10 | Discharge: 2021-10-11 | Payer: MEDICARE | Attending: Family | Primary: Family

## 2021-10-10 DIAGNOSIS — Z794 Long term (current) use of insulin: Principal | ICD-10-CM

## 2021-10-10 DIAGNOSIS — J4520 Mild intermittent asthma, uncomplicated: Principal | ICD-10-CM

## 2021-10-10 DIAGNOSIS — I10 Essential (primary) hypertension: Principal | ICD-10-CM

## 2021-10-10 DIAGNOSIS — E039 Hypothyroidism, unspecified: Principal | ICD-10-CM

## 2021-10-10 DIAGNOSIS — E11649 Type 2 diabetes mellitus with hypoglycemia without coma: Principal | ICD-10-CM

## 2021-10-10 DIAGNOSIS — F3341 Major depressive disorder, recurrent, in partial remission: Principal | ICD-10-CM

## 2021-10-10 LAB — ALBUMIN / CREATININE URINE RATIO
ALBUMIN QUANT URINE: <0.3 mg/dL
CREATININE, URINE: 98.7 mg/dL

## 2021-10-10 MED ORDER — OMNIPOD 5 G6 PODS (GEN 5) SUBCUTANEOUS CARTRIDGE
11 refills | 0 days | Status: CP
Start: 2021-10-10 — End: 2022-10-11

## 2021-10-10 MED ORDER — INSULIN PUMP CARTRIDGE SUBCUTANEOUS
SUBCUTANEOUS | 11 refills | 0 days | Status: CP
Start: 2021-10-10 — End: 2022-10-10

## 2021-10-10 NOTE — Unmapped (Signed)
Patient requests change to [facility] pill pack pharmacy and update  sig from change  pod every 72 hours to change pod every 48 hours because patient is running out of pods.    Order is pended for your signature

## 2021-10-11 DIAGNOSIS — J4520 Mild intermittent asthma, uncomplicated: Secondary | ICD-10-CM | POA: Insufficient documentation

## 2021-10-19 NOTE — Unmapped (Signed)
[facility] Refill Coordination Note    Specialty Medication(s) to be Shipped:   Inflammatory Disorders: Humira     Other medication(s) to be shipped: No additional medications requested for fill at this time     Logan Quinn, DOB: [birth year]  Phone: [phone] (home)     All above HIPAA information was verified with patient's family member, wife.     Was a Nurse, learning disability used for this call? No    Completed refill call assessment today to schedule patient's medication shipment from the [facility] [phone]).  All relevant notes have been reviewed.     Specialty medication(s) and dose(s) confirmed: Regimen is correct and unchanged.   Changes to medications: Logan Quinn reports no changes at this time.  Changes to insurance: No  New side effects reported not previously addressed with a pharmacist or physician: None reported  Questions for the pharmacist: No    Confirmed patient received a Conservation officer, historic buildings and a Surveyor, mining with first shipment. The patient will receive a drug information handout for each medication shipped and additional FDA Medication Guides as required.       DISEASE/MEDICATION-SPECIFIC INFORMATION        For patients on injectable medications: Patient currently has 1 doses left.  Next injection is scheduled for 10/25/2021.    SPECIALTY MEDICATION ADHERENCE     Medication Adherence    Patient reported X missed doses in the last month: 0  Specialty Medication: Humira  Patient is on additional specialty medications: No  Any gaps in refill history greater than 2 weeks in the last 3 months: no  Demonstrates understanding of importance of adherence: yes  Informant: spouse  Reliability of informant: reliable  Confirmed plan for next specialty medication refill: delivery by pharmacy  Refills needed for supportive medications: not needed        Were doses missed due to medication being on hold? No    Humira CF 80mg /0.51ml Inj: 7 days of medicine on hand     REFERRAL TO PHARMACIST     Referral to the pharmacist: Not needed    [facility]     Shipping address confirmed in Epic.     Delivery Scheduled: Yes, Expected medication delivery date: 10/25/2021.     Medication will be delivered via Same Day Courier to the prescription address in Epic WAM.    Logan Quinn   [facility] Specialty Technician

## 2021-10-21 ENCOUNTER — Encounter (INDEPENDENT_AMBULATORY_CARE_PROVIDER_SITE_OTHER): Payer: Self-pay | Admitting: Nurse Practitioner

## 2021-10-21 NOTE — Progress Notes (Signed)
Varicose veins of right  lower extremity with inflammation (454.1  I83.10) Current Plans   Indication: Patient presents with symptomatic varicose veins of the right  lower extremity.   Procedure: Sclerotherapy using hypertonic saline mixed with 1% Lidocaine was performed on the right lower extremity. Compression wraps were placed. The patient tolerated the procedure well. 

## 2021-10-25 MED FILL — HUMIRA(CF) PEN 80 MG/0.8 ML SUBCUTANEOUS KIT: SUBCUTANEOUS | 28 days supply | Qty: 4 | Fill #3

## 2021-10-30 ENCOUNTER — Ambulatory Visit: Admit: 2021-10-30 | Discharge: 2021-10-31 | Payer: MEDICARE

## 2021-10-30 NOTE — Unmapped (Signed)
1. 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  The patient was advised to maintain tight glucose control, tight blood pressure control, and favorable levels of cholesterol.  Follow up in one year was recommended.    2. CHRPE OS  Incidental finding. No risk. Monitor.    3. H/o mac off retinal detachment OS  Cryo/ pneumatic 04/12/13. History of mild residual loculated subretinal fluid in macular -likely etiology of pigmentary changes  Flat and attached. Educated about F/F/shadows. Monitor.  4. refr d/o- new rx provided.      RTC 1 year  Cc [name], FNP     INTERPRETATION EXTENDED OPHTHALMOSCOPY (20D with scleral depression)  Retinal Periphery - right:  retina flat and attached, no signs of DR  Retinal Periphery - left:  Well treated scar  at 11 o'clock,  at 3 o'clock flat pigmented plaque consistent with CHRPE adjacent to ora, 1500 microns in size,  pigmented demarcation line in superior mid periphery     As of 10/30/2021 the assessment and plan are unchanged.

## 2021-11-06 ENCOUNTER — Ambulatory Visit (INDEPENDENT_AMBULATORY_CARE_PROVIDER_SITE_OTHER): Payer: Medicare Other | Admitting: Nurse Practitioner

## 2021-11-06 ENCOUNTER — Encounter (INDEPENDENT_AMBULATORY_CARE_PROVIDER_SITE_OTHER): Payer: Self-pay

## 2021-11-15 ENCOUNTER — Ambulatory Visit (INDEPENDENT_AMBULATORY_CARE_PROVIDER_SITE_OTHER): Payer: 59 | Admitting: Podiatry

## 2021-11-15 ENCOUNTER — Encounter: Payer: Self-pay | Admitting: Podiatry

## 2021-11-15 DIAGNOSIS — E1142 Type 2 diabetes mellitus with diabetic polyneuropathy: Secondary | ICD-10-CM

## 2021-11-15 DIAGNOSIS — I872 Venous insufficiency (chronic) (peripheral): Secondary | ICD-10-CM

## 2021-11-15 DIAGNOSIS — I890 Lymphedema, not elsewhere classified: Secondary | ICD-10-CM | POA: Diagnosis not present

## 2021-11-15 DIAGNOSIS — M79676 Pain in unspecified toe(s): Secondary | ICD-10-CM

## 2021-11-15 DIAGNOSIS — B351 Tinea unguium: Secondary | ICD-10-CM

## 2021-11-15 NOTE — Progress Notes (Signed)
This patient returns to my office for at risk foot care.  This patient requires this care by a professional since this patient will be at risk due to having diabetes with no complications.  This patient is unable to cut nails himself since the patient cannot reach his nails.These nails are painful walking and wearing shoes.  This patient presents for at risk foot care today.  General Appearance  Alert, conversant and in no acute stress.  Vascular  Dorsalis pedis and posterior tibial  pulses are palpable  bilaterally.  Capillary return is within normal limits  bilaterally. Temperature is within normal limits  bilaterally.  Neurologic  Senn-Weinstein monofilament wire test within normal limits  bilaterally. Muscle power within normal limits bilaterally.  Nails Thick disfigured discolored nails with subungual debris  from hallux to fifth toes bilaterally. No evidence of bacterial infection or drainage bilaterally.  Orthopedic  No limitations of motion  feet .  No crepitus or effusions noted.  No bony pathology or digital deformities noted.  Skin  normotropic skin with no porokeratosis noted bilaterally.  No signs of infections or ulcers noted.     Onychomycosis  Pain in right toes  Pain in left toes  Consent was obtained for treatment procedures.   Mechanical debridement of nails 1-5  bilaterally performed with a nail nipper.  Filed with dremel without incident.    Return office visit   4 months                   Told patient to return for periodic foot care and evaluation due to potential at risk complications.   Novice Vrba DPM  

## 2021-11-20 NOTE — Unmapped (Signed)
[facility] Refill Coordination Note    Specialty Medication(s) to be Shipped:   Inflammatory Disorders: Humira     Other medication(s) to be shipped: No additional medications requested for fill at this time     Sasan Wilkie, DOB: [birth year]  Phone: [phone] (home)     All above HIPAA information was verified with patient.     Was a Nurse, learning disability used for this call? No    Completed refill call assessment today to schedule patient's medication shipment from the [facility] [phone]).  All relevant notes have been reviewed.     Specialty medication(s) and dose(s) confirmed: Regimen is correct and unchanged.   Changes to medications: Maritza reports no changes at this time.  Changes to insurance: Yes: new optumrx plan(saved in wam as primary)  New side effects reported not previously addressed with a pharmacist or physician: None reported  Questions for the pharmacist: No    Confirmed patient received a Conservation officer, historic buildings and a Surveyor, mining with first shipment. The patient will receive a drug information handout for each medication shipped and additional FDA Medication Guides as required.       DISEASE/MEDICATION-SPECIFIC INFORMATION        For patients on injectable medications: Patient currently has 1 doses left.  Next injection is scheduled for 11/22/2021.    SPECIALTY MEDICATION ADHERENCE     Medication Adherence    Patient reported X missed doses in the last month: 0  Specialty Medication: Humira CF 80 mg/0.8 ml  Patient is on additional specialty medications: No  Any gaps in refill history greater than 2 weeks in the last 3 months: no  Demonstrates understanding of importance of adherence: yes  Informant: patient  Reliability of informant: reliable  Confirmed plan for next specialty medication refill: delivery by pharmacy  Refills needed for supportive medications: not needed        Were doses missed due to medication being on hold? No    Humira CF 80mg /0.76ml Inj: 7 days of medicine on hand     REFERRAL TO PHARMACIST     Referral to the pharmacist: Not needed    [facility]     Shipping address confirmed in Epic.     Delivery Scheduled: Yes, Expected medication delivery date: 11/23/2021.     Medication will be delivered via Same Day Courier to the prescription address in Epic WAM.    Ilah Boule D Farin Buhman   [facility] Specialty Technician

## 2021-11-21 ENCOUNTER — Ambulatory Visit: Admit: 2021-11-21 | Discharge: 2021-11-22 | Payer: MEDICARE

## 2021-11-21 DIAGNOSIS — G4733 Obstructive sleep apnea (adult) (pediatric): Principal | ICD-10-CM

## 2021-11-21 DIAGNOSIS — E669 Obesity, unspecified: Principal | ICD-10-CM

## 2021-11-21 DIAGNOSIS — J4530 Mild persistent asthma, uncomplicated: Principal | ICD-10-CM

## 2021-11-21 NOTE — Unmapped (Signed)
Pulmonary Clinic - Initial Visit    Referring Physician :  Loran Senters  PCP:     [name], FNP  Reason for Consult:   Chronic bronchitis      ASSESSMENT and PLAN     Logan Quinn is a 41 y.o. male with severe OSA, GERD, NAFLD, DM2, Hidradenitis Suppurativa on Humira, depression w hx suicidal ideation, obesity (BMI 70) presenting for chronic bronchitis.    Mild persistent asthma: Bronchitis episodes variable responsive to albuterol and prednisone. Eos as high as 300 but 100 lately. VBG 2021 7.39/43. Struggles with Advair especially when sick. Cough a main symptom. PFTs suggestive of reversibility but no fixed obstruction. On Humira for HS, could consider atypical/indolent infx too.   - continue Symbicort  - Continue montelukast  - Continue GERD Rx  - Suspect viral illnesses from young children + some untreated asthma. If worsening and persistent (episdoic currently) then would check ct chest    OSA:  - CPAP restarted    Obesity:  - Continue Trulicity    There are no diagnoses linked to this encounter.      Plan of care was discussed with the patient who acknowledged understanding and is in agreement.    Patient will return to clinic in 6 months or sooner if needed.    This patient was seen and discussed with attending physician, Dr. Hoyle Barr who agrees with the assessment and plan above.     ZO:XWRU Rosita Fire, [name], FNP    Burman Riis Pauleen Goleman MD  Pulmonary and Critical Care Fellow  11/21/21   ~~~~~    HISTORY:     History of Present Illness:  Logan Quinn is a 41 y.o. male with a history of the above whom we are seeing in consultation requested by Loran Senters for evaluation of chronic bronchitis.    Here for chronic bronchitis he gets 4-6x/year.    Has been taking Advair discus and albuterol and wants to know what more can be done.    First time he had bronchitis was around age 49. It seems to become more prevalent since then. At first there was a seaonality, like when the seasons change. Winter times are still worse, but it seems more indiscriminanty these days. No odor/perfume sensitivity. Cooler temperatures make it worse. A typical course is a hacking coughing, a little phlegm in the beginning, then just irritating non-productive cough, chest tightness, phlegm is often sticky/thick and green. Usually it just has to run its course, usually drinks hot liquids and uses Vicks vaporub. Albuterol gives temporary relief. In the midst of an attack he doesn't feel the medicine is getting in (Advair). Prednisone and abx don't consistently help. Last episode was Dec-Jan.     No nighttime cough. CPAP machine broken so not using. Will see sleep clinic soon amd is working with [facility] to get a loaner    Prior to 30's no other lung dz. No tobacco or vape or MJ. No inh exposures. Currently disabled and cares for his kids. Pets at home: 3 dogs. Has had dogs since 2008 and most of his life. No birds.     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 They are 2 floors apart, and they have been in the building for 3 years only on Tuesday nights.     Born and raised in [place]. Dad's mom had COPD. Mom has COPD. Both smoke.  Has GERD and has seen GI and ENT who put him on 2x PPI and H2.     Has hidradenitis suppurativa treated with Humira. Started ~ 2020    Interval 11/21/21:  - Using Symbicort well, BID  - Last 2 weeks had to use albuterol a few times while camping and making campfires. 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)     Obstructive sleep apnea     OSA on CPAP     Retinal detachment 04/2014    Severe obstructive sleep apnea     Trapezius muscle strain 12/07/2013    Urinary incontinence, nocturnal enuresis     Venous insufficiency      Past Surgical History:   Procedure Laterality Date    EYE SURGERY  04/2014    PR COLONOSCOPY FLX DX W/COLLJ SPEC WHEN PFRMD N/A 03/24/2013    Procedure: COLONOSCOPY, FLEXIBLE, PROXIMAL TO SPLENIC FLEXURE; DIAGNOSTIC, W/WO COLLECTION SPECIMEN BY BRUSH OR WASH;  Surgeon: Clint Bolder, MD;  [facility];  Service: Gastroenterology    PR EYE SURG POST SGMT PROC UNLISTED Left     pneumatic retinopexy OS    PR UPPER GI ENDOSCOPY,DIAGNOSIS N/A 02/02/2013    Procedure: UGI ENDO, INCLUDE ESOPHAGUS, STOMACH, & DUODENUM &/OR JEJUNUM; DX W/WO COLLECTION SPECIMN, BY BRUSH OR WASH;  Surgeon: Malcolm Metro, MD;  Location: [facility];  Service: Gastroenterology    [place] PYLORIC STENOSIS ([place] HISTORICAL RESULT)         Other History:  The social history and family history were personally reviewed and updated in the patient's electronic medical record.    Family History   Problem Relation Age of Onset    Cancer Maternal Grandfather         Stomach Cancer    Hearing loss Maternal Grandfather     Cancer Paternal Grandfather         Bone Cancer, Lung Cancer    COPD Paternal Grandmother     Arthritis Paternal Grandmother     Depression Paternal Grandmother     Diabetes Mother     Heart disease Mother     Migraines Mother     Arthritis Mother     Depression Mother     GER disease Mother     Hypertension Mother     Angina Mother     COPD Mother     Glaucoma Mother     Hearing loss Mother     Cataracts Mother     Diabetes Sister     Migraines Sister     Asthma Sister     Depression Sister     Hearing loss Sister     Diabetes  Brother     Asthma Brother     Diabetes Maternal Grandmother     Heart disease Maternal Grandmother     Migraines Maternal Grandmother     Depression Maternal Grandmother     Angina Maternal Grandmother     Hypertension Maternal Grandmother     Stroke Maternal Grandmother     Diabetes Maternal Uncle     Hearing loss Maternal Uncle     Diabetes Maternal Uncle         resulted in need for kidney transplant    Liver disease Maternal Uncle     Kidney disease Maternal Uncle         needed kidney transplant    Asthma Brother     No Known Problems Father     No Known Problems Paternal Aunt     No Known Problems Paternal Uncle     No Known Problems Other     Colorectal Cancer Neg Hx     Esophageal cancer Neg Hx     Liver cancer Neg Hx     Pancreatic cancer Neg Hx     Stomach cancer Neg Hx     Amblyopia Neg Hx     Blindness Neg Hx     Retinal detachment Neg Hx     Strabismus Neg Hx     Macular degeneration Neg Hx     Anesthesia problems Neg Hx     Broken bones Neg Hx     Clotting disorder Neg Hx     Collagen disease Neg Hx     Dislocations Neg Hx     Fibromyalgia Neg Hx     Gout Neg Hx     Hemophilia Neg Hx     Osteoporosis Neg Hx     Rheumatologic disease Neg Hx     Scoliosis Neg Hx     Severe sprains Neg Hx     Sickle cell anemia Neg Hx     Spinal Compression Fracture Neg Hx     Melanoma Neg Hx     Basal cell carcinoma Neg Hx     Squamous cell carcinoma Neg Hx     Deep vein thrombosis Neg Hx     Thyroid disease Neg Hx      Social History     Socioeconomic History    Marital status: Married   Tobacco Use    Smoking status: Never    Smokeless tobacco: Never   Vaping Use    Vaping Use: Never used   Substance and Sexual Activity    Alcohol use: Not Currently     Comment: rare social    Drug use: Never    Sexual activity: Yes     Partners: Female     Birth control/protection: None   Other Topics Concern    Do you use sunscreen? Yes    Tanning bed use? No    Are you easily burned? No    Excessive sun exposure? No    Blistering sunburns? No     Social Determinants of Psychologist, prison and probation services Strain: Low Risk     Difficulty of Paying Living Expenses: Not very hard   Food Insecurity: No Food Insecurity    Worried About Programme researcher, broadcasting/film/video in the Last Year: Never true    Ran Out of Food in the Last Year: Never true   Transportation Needs: No Transportation Needs    Lack of Transportation (Medical): No    Lack  of Transportation (Non-Medical): No       Home Medications:  Current Outpatient Medications on File Prior to Visit   Medication Sig Dispense Refill    acetaminophen (TYLENOL) 500 MG tablet Take 2 tablets (1,000 mg total) by mouth Three (3) times a day. (Patient taking differently: Take 1 tablet (500 mg total) by mouth Three (3) times a day.) 30 tablet 0    adalimumab (HUMIRA,CF, PEN) 80 mg/0.8 mL PnKt Inject the contents of 1 pen (80mg ) under the skin once a week. 4 each 11    albuterol 2.5 mg /3 mL (0.083 %) nebulizer solution Inhale 3 mL (2.5 mg total) by nebulization every four (4) hours as needed for wheezing. 180 mL 2    albuterol HFA 90 mcg/actuation inhaler Inhale 2 puffs every six (6) hours as needed for wheezing. 8 g 5    ascorbic acid (VITAMIN C ORAL) Take 1 capsule by mouth nightly.      blood sugar diagnostic (ACCU-CHEK GUIDE TEST STRIPS) Strp by Other route Three (3) times a day before meals. 300 each 1    budesonide-formoteroL (SYMBICORT) 80-4.5 mcg/actuation inhaler Inhale 2 puffs Two (2) times a day. 10.2 g 11    clindamycin (CLEOCIN T) 1 % external solution clindamycin phosphate 1 % topical solution   APPLY TOPICALLY TO GROIN AREA TWICE A DAY      clindamycin (CLEOCIN T) 1 % lotion APPLY TOPICALLY AT NIGHT TO INFLAMED AREAS AS NEEDED FOR FLARES      clobetasoL (TEMOVATE) 0.05 % ointment Apply daily to painful affected areas if needed for flares, then stop 15 g 1    colchicine (MITIGARE) 0.6 mg cap capsule TAKE 2 CAPSULES BY MOUTH ON DAY ONE, FOLLOWED BY ONE CAPSULE ONE HOUR LATER, ON DAY 2 TAKE ONE CAPSULE BY MOUTH TWICEA DAY UNTIL RESOLVED 60 capsule 0 cyclobenzaprine (FLEXERIL) 10 MG tablet Take 1 tablet (10 mg total) by mouth Three (3) times a day as needed for muscle spasms. 60 tablet 1    dextroamphetamine sulfate (DEXTROSTAT) 10 MG tablet       dicyclomine (BENTYL) 10 mg capsule Take 1 capsule (10 mg total) by mouth Three (3) times a day. 270 capsule 1    diphenoxylate-atropine (LOMOTIL) 2.5-0.025 mg per tablet Take 1 tablet by mouth two (2) times a day as needed for diarrhea. 30 tablet 5    dulaglutide (TRULICITY) 1.5 mg/0.5 mL PnIj Inject 0.5 mL (1.5 mg total) under the skin every seven (7) days. 2 mL 5    DULoxetine (CYMBALTA) 60 MG capsule Take 1 capsule (60 mg total) by mouth Two (2) times a day. 180 capsule 0    empty container (SHARPS CONTAINER) Misc Use as directed to dispose of Humira needles 1 each 2    erenumab-aooe (AIMOVIG AUTOINJECTOR) 140 mg/mL AtIn Inject 140 mg under the skin every thirty (30) days. 3 mL 1    famotidine (PEPCID) 20 MG tablet Take 1 tablet (20 mg total) by mouth Two (2) times a day. 180 tablet 1    fluoride, sodium, 1.1 % Pste       fluticasone propionate (FLONASE) 50 mcg/actuation nasal spray 1 spray into each nostril Two (2) times a day. 16 g 5    glipiZIDE (GLUCOTROL) 10 MG tablet Take 2 tablets (20 mg total) by mouth Two (2) times a day (30 minutes before a meal). 360 tablet 1    HUMALOG U-100 INSULIN 100 unit/mL injection INJECT 120 UNITS UNDER THE SKIN DAILY VIA OMNIPOD  40 mL 5    hydroCHLOROthiazide (HYDRODIURIL) 25 MG tablet Take 1 tablet (25 mg total) by mouth daily. 90 tablet 3    hydrocortisone (PROCTOSOL HC) 2.5 % rectal cream Insert 1 application into the rectum two (2) times a day as needed. 28.35 g 2    hydrOXYzine (ATARAX) 25 MG tablet Take 1 tablet (25 mg total) by mouth Three (3) times a day as needed. (Patient taking differently: Take 1 tablet (25 mg total) by mouth four (4) times a day as needed.) 120 tablet 1    ibuprofen (ADVIL,MOTRIN) 800 MG tablet Take 1 tablet (800 mg total) by mouth every eight (8) hours as needed. 90 tablet 1    insulin pump cart,automated,BT (OMNIPOD 5 G6 PODS, GEN 5,) Crtg Change POD every 48 hours. 3 each 11    insulin pump cartridge Crtg Inject 200 Units under the skin every other day. Change every 48 hours 15 each 11    insulin syringe-needle U-100 1 mL 31 gauge x 5/16 (8 mm) Syrg       lancets (ACCU-CHEK FASTCLIX LANCET DRUM) Misc Use to check blood sugars 3 times a day before meals or as directed 200 each 5    lidocaine (XYLOCAINE) 5 % ointment lidocaine 5 % topical ointment      montelukast (SINGULAIR) 10 mg tablet Take 1 tablet (10 mg total) by mouth daily. 90 tablet 3    multivitamin (MULTIPLE VITAMIN ORAL) Take 1 tablet by mouth daily.      ondansetron (ZOFRAN-ODT) 4 MG disintegrating tablet Take 1 tablet (4 mg total) by mouth every eight (8) hours as needed for nausea for up to 15 doses. 15 tablet 1    oxybutynin (DITROPAN) 5 MG tablet Take 1 tablet (5 mg total) by mouth Two (2) times a day. 180 tablet 1    pantoprazole (PROTONIX) 40 MG tablet Take 1 tablet (40 mg total) by mouth Two (2) times a day. 180 tablet 1    polyethylene glycol (GLYCOLAX) 17 gram/dose powder take 17GM (DISSOLVED IN WATER) by mouth once daily      propranoloL (INDERAL LA) 120 mg 24 hr capsule Take 1 capsule (120 mg total) by mouth daily. 90 capsule 1    ramelteon (ROZEREM) 8 mg tablet Take 1 tablet (8 mg total) by mouth nightly.      sour cherry extract (TART CHERRY EXTRACT) 1,000 mg cap       blood-glucose meter kit Use as directed 1 each 0    dextroamphetamine-amphetamine (ADDERALL XR) 20 MG 24 hr capsule Take 1 capsule (20 mg total) by mouth every morning.      [EXPIRED] ketoconazole (NIZORAL) 2 % cream Apply 1 application topically daily. To the affected area of the groin 60 g 11    levothyroxine (SYNTHROID) 50 MCG tablet Take 1 tablet (50 mcg total) by mouth daily. 90 tablet 1    topiramate (TOPAMAX) 50 MG tablet Take 1.5 tablets (75 mg total) by mouth Two (2) times a day. 270 tablet 1     No current facility-administered medications on file prior to visit.       Allergies:  Allergies as of 11/21/2021 - Reviewed 11/21/2021   Allergen Reaction Noted    Erythromycin Nausea And Vomiting 01/25/2015    Penicillins Rash, Swelling, and Hives 12/06/2012    Sulfa (sulfonamide antibiotics) Nausea And Vomiting and Nausea Only 12/30/2012    Other         Review of Systems:  A comprehensive review of  systems was completed and negative except as noted in HPI.    PHYSICAL EXAM:   BP 124/65 (BP Site: L Arm, BP Position: Sitting, BP Cuff Size: Large)  - Pulse 101  - Temp 36.7 ??C (98 ??F) (Temporal)  - Wt (!) 230 kg (507 lb)  - SpO2 93%  - BMI 72.50 kg/m??   GEN: NAD, sitting in chair, obese  EYES: EOMI, sclera anicteric  ENT: Trachea midline, MMM  CV: RRR, no murmurs appreciated  PULM: CTA B, normal WoB, no stridor  ABD: soft, non-tender, non-distended  EXT: No edema  NEURO: Grossly Non-focal, moving all extremities normally  PSYCH: A+Ox3, appropriate  MSK: no obvious joint deformities of b/l hands      LABORATORY and RADIOLOGY DATA:     Pulmonary Function Tests/Interpretation:        Pertinent Laboratory Data:    Personally reviewed in EMR    Pertinent Imaging Data:  Personally reviewed in EMR

## 2021-11-22 NOTE — Unmapped (Signed)
I saw and evaluated the patient, participating in the key portions of the service.  I reviewed the fellows???s note.  I agree with the fellows???s findings and plan. Guido Sander, MD

## 2021-11-23 DIAGNOSIS — G43909 Migraine, unspecified, not intractable, without status migrainosus: Principal | ICD-10-CM

## 2021-11-23 MED ORDER — TOPIRAMATE 50 MG TABLET
ORAL_TABLET | Freq: Two times a day (BID) | ORAL | 1 refills | 90 days | Status: CP
Start: 2021-11-23 — End: 2022-11-23

## 2021-11-23 MED FILL — HUMIRA(CF) PEN 80 MG/0.8 ML SUBCUTANEOUS KIT: SUBCUTANEOUS | 28 days supply | Qty: 4 | Fill #4

## 2021-11-23 NOTE — Unmapped (Signed)
Patient is requesting the following refill  Requested Prescriptions     Pending Prescriptions Disp Refills    topiramate (TOPAMAX) 50 MG tablet 270 tablet 1     Sig: Take 1.5 tablets (75 mg total) by mouth Two (2) times a day.       Recent Visits  Date Type Provider Dept   10/10/21 Office Visit [name] Rosita Fire, FNP [facility] [address] [facility]   09/11/21 Office Visit [name] Rosita Fire, FNP [place] [facility] [address] [facility]   07/13/21 Office Visit [name] Rosita Fire, FNP [place] [facility]   04/12/21 Office Visit [name] Rosita Fire, FNP [place] [facility] [address] [facility]   02/05/21 Office Visit [name] Rosita Fire, FNP [place] [facility]   01/09/21 Office Visit [name] Rosita Fire, FNP [place] [facility]   Showing recent visits within past 365 days with a meds authorizing provider and meeting all other requirements  Future Appointments  Date Type Provider Dept   01/11/22 Appointment [name] Rosita Fire, FNP  [facility] [address] [facility]   Showing future appointments within next 365 days with a meds authorizing provider and meeting all other requirements       Labs: Not applicable this refill

## 2021-11-26 MED ORDER — AEROCHAMBER MV SPACER
0 refills | 1 days | Status: CP | PRN
Start: 2021-11-26 — End: ?

## 2021-11-28 ENCOUNTER — Ambulatory Visit: Admit: 2021-11-28 | Discharge: 2021-11-29 | Payer: MEDICARE | Attending: Family | Primary: Family

## 2021-11-28 DIAGNOSIS — R0981 Nasal congestion: Principal | ICD-10-CM

## 2021-11-28 DIAGNOSIS — N529 Male erectile dysfunction, unspecified: Principal | ICD-10-CM

## 2021-11-28 DIAGNOSIS — Z6841 Body Mass Index (BMI) 40.0 and over, adult: Principal | ICD-10-CM

## 2021-11-28 DIAGNOSIS — J3089 Other allergic rhinitis: Principal | ICD-10-CM

## 2021-11-28 DIAGNOSIS — J45991 Cough variant asthma: Principal | ICD-10-CM

## 2021-11-28 DIAGNOSIS — J324 Chronic pansinusitis: Principal | ICD-10-CM

## 2021-11-28 MED ORDER — CETIRIZINE 10 MG TABLET
ORAL_TABLET | Freq: Every day | ORAL | 1 refills | 90 days | Status: CP
Start: 2021-11-28 — End: 2022-11-28

## 2021-11-28 NOTE — Unmapped (Signed)
[facility], Acute Care Note  Assessment/Plan:      Diagnosis ICD-10-CM Associated Orders   1. Chronic pansinusitis  J32.4 41 yo male presented in office with ongoing sinus pain and pressure for approximately a week. Discussed viral vs bacterial etiology with patient. Counseled patient to consider referral to ENT if he continues to have chronic sinusitis. Viral panel performed in clinic and will notify patient with recommendations pending results.       2. Sinus congestion  R09.81 Follow advice as detailed above.     Respiratory Pathogen Panel with COVID-19      3. Cough variant asthma  J45.991 Follow advice as detailed above.     Respiratory Pathogen Panel with COVID-19      4. Nasal congestion  R09.81 Follow advice as detailed above.     Respiratory Pathogen Panel with COVID-19      5. Environmental and seasonal allergies  J30.89 Patient has chronic seasonal allergies. Take Zyrtec 10 mg daily.     cetirizine (ZYRTEC) 10 MG tablet      6. Class 3 severe obesity due to excess calories with serious comorbidity and body mass index (BMI) of 60.0 to 69.9 in adult (CMS-HCC)  E66.01 Patient interested in pursuing bariatric surgery and options for weight loss. Referral placed.     AMB REFERRAL TO BARIATRIC SURGERY    Z68.44       7. Erectile dysfunction, unspecified erectile dysfunction type  N52.9 Patient has previously seen Urology for ED. New referral placed.     Ambulatory referral to Urology      40 minutes of clinical time, > 1/2 the office visit, Medication adherence and barriers to the treatment plan have been addressed. Opportunities to optimize healthy behaviors have been discussed. Patient / caregiver voiced understanding, advised F/U visit as needed.   Subjective:     Chief Complaint   Patient presents with    Sinus Problem     Sinus pain/pressure/HA x 1 week.  Yellow-green-bloody nasal discharge x 1 week.  Camped last weekend.  Air conditioning in cabin 66 degree then 90 degrees outside.  Feels like his head is in the clouds.      Logan Quinn is here for evaluation of sinus pain/pressure. Patient went camping this past weekend and symptoms started while there.     URI/Cough/Congestion: Patient presents with cough, nasal congestion, rhinorrhea, post-nasal drainage, fatigue, and itchy eyes for 1 week. Nasal drainage is discolored and bloody. There is some nasal and facial pressure. Cough is productive of yellow/green sputum. Symptoms are generally worse inconsistently. Patient denies sinus pressure, sinus congestion, sore throat, headache, chills, fever, body aches, SOB, and wheezing. For relief, patient has tried sinus rinses, eye drops, Mucinex, Chlorotab, and Sudafed. Recent travel: yes. Sick contacts: Went camping around [place] area.States there were particles floating around. Others that went were having similar symptoms.  His eyes have been itchy and irritated. CPAP causes him to cough and feel like he is gagging. Has felt run down. Notes his ears did start aching last night.     Patient asked for a prescription for Zyrtec, hoping his insurance will cover this now.     He asked for a referral to Bariatric clinic for consultation for possible Gastric Bypass surgery.     Patient asked about resubmitting referral to Urology for testosterone management. Notes he and his wife are done having children.       Denies HA, Fever, chest pain, N/V/D,  bowel or bladder issues, or swelling.   ROS: 12 systems reviewed, no other listed complaints.    Environmental History:  went camping and had poor air quality while there.    Pertinent Past Med Hx:    Past Medical History:   Diagnosis Date    Hyperlipidemia     Hypertension      Medications:   Current Outpatient Medications:     amLODIPine (NORVASC) 10 MG tablet, TAKE 1 TABLET (10 MG TOTAL) BY MOUTH IN THE MORNING., Disp: 90 tablet, Rfl: 1    atorvastatin (LIPITOR) 10 MG tablet, Take 1 tablet (10 mg total) by mouth daily., Disp: 90 tablet, Rfl: 1    benazepriL (LOTENSIN) 40 MG tablet, TAKE 1 TABLET BY MOUTH EVERY DAY, Disp: 90 tablet, Rfl: 1    chlorthalidone (HYGROTON) 25 MG tablet, Take 1 tablet (25 mg total) by mouth every morning. Start with 1/2 a tablet daily, after 1 week, may increase to 1 tablet daily if BP remains elevated, Disp: 90 tablet, Rfl: 1    fenofibrate micronized (LOFIBRA) 134 MG capsule, Take 1 capsule (134 mg total) by mouth daily before breakfast., Disp: 90 capsule, Rfl: 3    metoprolol succinate (TOPROL-XL) 25 MG 24 hr tablet, Start 1/2 a tab daily.  If home diastolic reading remains consistently above 90, increase dose to 1 a day., Disp: 30 tablet, Rfl: 11    tadalafiL (CIALIS) 5 MG tablet, TAKE ONE TABLET BY MOUTH DAILY, Disp: 30 tablet, Rfl: 6     Allergies:   Allergies   Allergen Reactions    Erythromycin Nausea And Vomiting     Other reaction(s): Vomiting    Penicillins Rash, Swelling and Hives    Sulfa (Sulfonamide Antibiotics) Nausea And Vomiting and Nausea Only    Other      Objective:          11/28/21 1417   BP: 128/82   Pulse: 101   Temp: 36.8 ??C (98.3 ??F)   Weight: (!) 221.8 kg (489 lb)   Height: 178.1 cm (5' 10.12)   PainSc: 6    PainLoc: Head       General: Alert and oriented x3. Well-appearing. No acute distress. Morbidly obese.   HEENT:  Normocephalic.  Atraumatic. Conjunctiva and sclera normal. Audibly congested. OP MMM without lesions.   Neck:  Supple. No thyroid enlargement. No adenopathy.   Heart:  Regular rate and rhythm. Normal S1, S2. No murmurs, rubs or gallops.   Lungs:  No respiratory distress.  Lungs clear to auscultation. No wheezes, rhonchi, or rales.   GI/GU:  Soft, +BS, nondistended, non-TTP. No palpable masses or organomegaly.   Extremities:  No edema. Peripheral pulses normal.   Skin:  Warm, dry. No rash or lesions present.   Neuro:  Non-focal. No obvious weakness.   Psych:  Affect normal, eye contact good, speech clear and coherent.     Laboratories:         I attest that I, Vergia Alberts, personally documented this note while acting as scribe for [name], FNP.      Vergia Alberts, Scribe.  10/30/2021     The documentation recorded by the scribe accurately reflects the service I personally performed and the decisions made by me.    [name], FNP

## 2021-12-04 ENCOUNTER — Encounter (INDEPENDENT_AMBULATORY_CARE_PROVIDER_SITE_OTHER): Payer: Self-pay | Admitting: Nurse Practitioner

## 2021-12-04 ENCOUNTER — Ambulatory Visit (INDEPENDENT_AMBULATORY_CARE_PROVIDER_SITE_OTHER): Payer: 59 | Admitting: Nurse Practitioner

## 2021-12-04 VITALS — BP 173/79 | HR 73 | Resp 17 | Ht 71.0 in | Wt >= 6400 oz

## 2021-12-04 DIAGNOSIS — I83813 Varicose veins of bilateral lower extremities with pain: Secondary | ICD-10-CM

## 2021-12-04 NOTE — Progress Notes (Signed)
Varicose veins of right  lower extremity with inflammation (454.1  I83.10) Current Plans   Indication: Patient presents with symptomatic varicose veins of the right  lower extremity.   Procedure: Sclerotherapy using hypertonic saline mixed with 1% Lidocaine was performed on the right lower extremity. Compression wraps were placed. The patient tolerated the procedure well. 

## 2021-12-04 NOTE — Unmapped (Signed)
[facility] Financial Information Form    Hospital NPI: [id number]   Physicians NPI: [id number]     MRN: [id number]  Patient: Logan Quinn    Insurance Company: Medicare Advantage Plan: [facility]  Telephone: [phone]    Policy/Subscriber ID: [id number]     Subscriber Information (if different from patient)  Name: n/a  Date of Birth:  n/a     Date Called: 12/04/21  Spoke with: tracey  Reference # (if given): [id number]    Is Bariatric Surgery Covered: Yes  Covered Surgeries:   43644: Laparoscopic roux-en-y gastric bypass   43775: Laparoscopic gastric sleeve     **Completion of this form does not guarantee coverage, and is subject to change. The patient is instructed at the time of scheduling that they need to call their insurance company directly to confirm bariatric surgery coverage benefits**     Deductible Amount? Out of Pocket Amount?   Is deductible applied to out of pocket amount?   Once out of pocket is meet, what is the benefit coverage?   %  What is the speciality copay? $ covered 100%    What requirements must be met for surgery to be approved:  Patient must have referral on file within 6 months of surgery for approval (Follows Medicare Guidelines)    Physician Supervised Diet? Yes How Long? 6 months  Does it have to be consecutive? Yes  Does it have to be done by PCP or can it be by a dietitian?  Either (RD if physician supervised)  How recent does it have to be? (past , , etc.) within 1 year  Do I need a weight history? (29yr, 73yrs, 52yrs) Yes  Do I need a clearance/referral letter from a doctor? Yes   If yes, does it have to come from PCP? Yes  Are nutrition consultations a covered benefit? Only as listed below:  [id number]: 15, 30, 45, 60 min preventive : only covered for diabetics or kidney disease  97802: Initial 1:1 nutrition counseling : only covered for diabetics or kidney disease  [phone]: Follow up 1:1 nutrition counseling : only covered for diabetics or kidney disease  97804: Group MNT nutrition counseling : only covered for diabetics or kidney disease   [id number]: IBT Individual : only covered for diabetics or kidney disease  [id number]: IBT Group : only covered for diabetics or kidney disease

## 2021-12-07 ENCOUNTER — Ambulatory Visit
Admission: RE | Admit: 2021-12-07 | Discharge: 2021-12-07 | Disposition: A | Discharge status: Home / Self Care | Payer: 59 | Source: Ambulatory Visit | Attending: Emergency Medicine | Admitting: Emergency Medicine

## 2021-12-07 VITALS — BP 144/82 | HR 68 | Temp 97.9°F | Resp 15 | Ht 71.0 in | Wt >= 6400 oz

## 2021-12-07 DIAGNOSIS — H6123 Impacted cerumen, bilateral: Secondary | ICD-10-CM | POA: Diagnosis not present

## 2021-12-07 NOTE — ED Triage Notes (Signed)
Patient c/o right ear fullness that started last night.  Patient reports not being able to hear out of the ear

## 2021-12-07 NOTE — ED Provider Notes (Signed)
[facility]    CSN: [id number] Arrival date & time: 12/07/21  0950      History   Chief Complaint Chief Complaint  Patient presents with   Ear Fullness    right    HPI Jason Davidson is a 41 y.o. male.   HPI  41 year old male here for evaluation of right ear fullness.  Patient reports that he has been experiencing fullness in his right ear since last night.  He also endorses some muffled hearing in that ear.  He denies any overt pain or drainage.  No fever, runny nose, nasal congestion, or other upper respiratory symptoms.  Past Medical History:  Diagnosis Date   Asthma    Depressed    Diabetes mellitus without complication (HCC)    IBS (irritable bowel syndrome)    Morbid obesity (HCC)     Patient Active Problem List   Diagnosis Date Noted   Acquired hypothyroidism 01/09/2021   Acute non-recurrent pansinusitis 07/19/2020   Hidradenitis suppurativa 04/29/2018   Allergic rhinitis 12/23/2017   Varicose veins of both lower extremities with pain 10/16/2017   IBS (irritable bowel syndrome) 06/18/2017   Chronic venous insufficiency 12/31/2016   Lymphedema 12/31/2016   Leg pain 12/31/2016   Diabetes (HCC) 12/31/2016   GERD (gastroesophageal reflux disease) 12/31/2016   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   H/O retinal detachment 10/01/2016   No diabetic retinopathy in both eyes 10/01/2016   Cellulitis of right lower extremity 06/19/2016   Chronic joint pain 11/16/2015   Skin infection 05/15/2015   Depressed    Tick bite 10/20/2014   Primary insomnia 07/08/2014   Candidal balanitis 02/16/2014   Skin tag 01/17/2014   Left shoulder pain 10/04/2013   Anxiety 09/13/2013   Diarrhea 09/13/2013   Bleeding external hemorrhoids 09/03/2013   NAFLD (nonalcoholic fatty liver disease) 27/12/8673   Headache 07/05/2013   Abdominal pain 03/05/2013   Morbid (severe) obesity due to excess calories (HCC) 03/05/2013   Suicidal  ideation 02/09/2013   Obstructive sleep apnea (adult) (pediatric) 07/21/2012   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 Authorizing Provider  Adalimumab 40 MG/0.4ML PNKT Inject into the skin. 09/10/18   [provider]  AIMOVIG 70 MG/ML SOAJ  07/21/17   [provider]  albuterol (VENTOLIN HFA) 108 (90 Base) MCG/ACT inhaler Inhale 1-2 puffs into the lungs every 6 (six) hours as needed for wheezing or shortness of breath. 04/06/20   Becky Augusta, NP  ALPRAZolam Prudy Feeler) 0.5 MG tablet Take 0.5 mg by mouth at bedtime as needed for anxiety.    [provider]  amphetamine-dextroamphetamine (ADDERALL) 10 MG tablet Take 10 mg by mouth 2 (two) times daily with a meal.    [provider]  azelastine (ASTELIN) 0.1 % nasal spray Place into the nose.    [provider]  Colchicine 0.6 MG CAPS Take 1.2 mg on Day 1 of flare, followed by 0.6 mg one hour later. Day 2 take 0.6 mg BID until flare resolves. 08/01/17   [provider]  cyclobenzaprine (FLEXERIL) 10 MG tablet Take 1 tablet (10 mg total) by mouth 3 (three) times daily as needed. 01/31/18   Joni Reining, PA-C  dicyclomine (BENTYL) 10 MG capsule Take 10 mg by mouth 3 (  three) times daily before meals.    [provider]  diphenoxylate-atropine (LOMOTIL) 2.5-0.025 MG tablet Take by mouth. 01/05/18   [provider]  Dulaglutide 0.75 MG/0.5ML SOPN Inject into the skin. 10/24/20   [provider]  DULoxetine (CYMBALTA) 60 MG capsule Take 120 mg by mouth daily.     [provider]  famotidine (PEPCID) 20 MG tablet Take by mouth. 10/15/18 10/15/19  [provider]  fluticasone (FLONASE) 50 MCG/ACT nasal spray Place 1 spray into both nostrils 2 (two) times daily. 01/03/21   [provider]   Fluticasone-Salmeterol (ADVAIR) 500-50 MCG/DOSE AEPB Inhale 1 puff into the lungs 2 (two) times daily.    [provider]  glipiZIDE (GLUCOTROL) 10 MG tablet Take 10 mg by mouth daily before breakfast.    [provider]  glucose blood test strip Use 1 strip 3 times a day with meter 06/26/15   [provider]  HUMALOG 100 UNIT/ML injection SMARTSIG:120 Unit(s) SUB-Q Daily 03/01/20   [provider]  hydrochlorothiazide (HYDRODIURIL) 12.5 MG tablet Take 12.5 mg by mouth daily. 02/05/21   [provider]  hydrocortisone (ANUSOL-HC) 2.5 % rectal cream Place rectally. 12/16/16   [provider]  hydrOXYzine (ATARAX/VISTARIL) 25 MG tablet Take 25 mg by mouth 4 (four) times daily as needed. 11/17/20   [provider]  Insulin Syringe-Needle U-100 31G X 5/16" 1 ML MISC Use as directed with the Lantus Insulin nightly 08/26/17   [provider]  ketoconazole (NIZORAL) 2 % cream Apply topically daily. 01/10/21   [provider]  levothyroxine (SYNTHROID) 50 MCG tablet Take by mouth. 10/05/18 10/05/19  [provider]  lidocaine (XYLOCAINE) 2 % solution Use as directed 15 mLs in the mouth or throat as needed for mouth pain. 06/14/21   Mickie Bail, NP  loperamide (IMODIUM) 2 MG capsule Take by mouth.    [provider]  montelukast (SINGULAIR) 10 MG tablet Take by mouth. 03/31/17 03/03/20  [provider]  ondansetron (ZOFRAN-ODT) 4 MG disintegrating tablet Take 4 mg by mouth every 8 (eight) hours as needed. 01/09/21   [provider]  oxybutynin (DITROPAN) 5 MG tablet Take 5 mg by mouth 2 (two) times daily. 01/09/21   [provider]  oxybutynin (DITROPAN) 5 MG tablet Take 1 tablet by mouth 2 (two) times daily. 06/06/21   [provider]  pantoprazole (PROTONIX) 40 MG tablet Take 40 mg by mouth daily.    [provider]  polyethylene glycol powder (GLYCOLAX/MIRALAX) powder take 17GM  (DISSOLVED IN WATER) by mouth once daily 07/25/17   [provider]  predniSONE (DELTASONE) 50 MG tablet One qd 05/28/21   Rodriguez-Southworth, Nettie Elm, PA-C  PROAIR HFA 108 (90 Base) MCG/ACT inhaler 1-2 puffs every 4 (four) hours as needed. Last used: [id number] 07/21/17   [provider]  promethazine (PHENERGAN) 25 MG tablet Take 25-50 mg by mouth at bedtime as needed. 01/09/21   [provider]  propranolol (INNOPRAN XL) 120 MG 24 hr capsule Take 120 mg by mouth at bedtime.    [provider]  ramelteon (ROZEREM) 8 MG tablet Take 8 mg by mouth at bedtime. 06/22/21   [provider]  sodium fluoride (FLUORISHIELD) 1.1 % GEL dental gel  01/29/21   [provider]  topiramate (TOPAMAX) 50 MG tablet Take 50 mg by mouth 2 (two) times daily.    [provider]  traZODone (DESYREL) 100 MG tablet Take by mouth.  [provider]    Family History Family History  Problem Relation Age of Onset   Diabetes Mother    Depression Mother    Diabetes Sister    Diabetes Brother    Diabetes Maternal Uncle    Diabetes Maternal Grandmother    Heart disease Maternal Grandmother    Cancer Maternal Grandfather    COPD Paternal Grandmother    Cancer Paternal Grandfather    Varicose Veins Paternal Grandfather    Healthy Father     Social History Social History   Tobacco Use   Smoking status: Never   Smokeless tobacco: Never  Vaping Use   Vaping Use: Never used  Substance Use Topics   Alcohol use: No   Drug use: No     Allergies   Erythromycin, Penicillins, and Sulfa antibiotics   Review of Systems Review of Systems  Constitutional:  Negative for fever.  HENT:  Positive for ear pain and hearing loss. Negative for ear discharge, rhinorrhea and sore throat.      Physical Exam Triage Vital Signs ED Triage Vitals  Enc Vitals Group     BP 12/07/21 1011 (!) 144/82     Pulse Rate 12/07/21 1011 68     Resp 12/07/21 1011 15      Temp 12/07/21 1011 97.9 F (36.6 C)     Temp Source 12/07/21 1011 Oral     SpO2 12/07/21 1011 100 %     Weight 12/07/21 1009 (!) 497 lb (225.4 kg)     Height 12/07/21 1009 5\' 11"  (1.803 m)     Head Circumference --      Peak Flow --      Pain Score 12/07/21 1009 0     Pain Loc --      Pain Edu? --      Excl. in GC? --    No data found.  Updated Vital Signs BP (!) 144/82 (BP Location: Left Arm)   Pulse 68   Temp 97.9 F (36.6 C) (Oral)   Resp 15   Ht 5\' 11"  (1.803 m)   Wt (!) 497 lb (225.4 kg)   SpO2 100%   BMI 69.32 kg/m   Visual Acuity Right Eye Distance:   Left Eye Distance:   Bilateral Distance:    Right Eye Near:   Left Eye Near:    Bilateral Near:     Physical Exam Vitals and nursing note reviewed.  Constitutional:      Appearance: Normal appearance. He is not ill-appearing.  HENT:     Head: Normocephalic and atraumatic.     Right Ear: External ear normal. There is impacted cerumen.     Left Ear: External ear normal. There is impacted cerumen.  Skin:    General: Skin is warm and dry.     Capillary Refill: Capillary refill takes less than 2 seconds.     Findings: No erythema or rash.  Neurological:     General: No focal deficit present.     Mental Status: He is alert and oriented to person, place, and time.  Psychiatric:        Mood and Affect: Mood normal.        Behavior: Behavior normal.        Thought Content: Thought content normal.        Judgment: Judgment normal.      UC Treatments / Results  Labs (all labs ordered are listed, but only abnormal results are displayed) Labs Reviewed - No  data to display  EKG   Radiology No results found.  Procedures Procedures (including critical care time)  Medications Ordered in UC Medications - No data to display  Initial Impression / Assessment and Plan / UC Course  I have reviewed the triage vital signs and the nursing notes.  Pertinent labs & imaging results that were available  during my care of the patient were reviewed by me and considered in my medical decision making (see chart for details).  Patient is a very pleasant, nontoxic-appearing 41 year old male here for evaluation of right ear fullness and muffled hearing that began yesterday.  This is not associated with any upper respiratory symptoms and patient denies any fever, drainage from the ear, or overt ear pain.  On exam patient has a cerumen impaction on the right side with yellow cerumen completely occluding the external auditory canal.  The left external auditory canal demonstrates moderate cerumen impaction with dark hard brown cerumen.  I am able to visualize the top half of the tympanic membrane beyond the wax obstruction and it is pearly gray in appearance.  I will order bilateral ear lavage and reassess.  Following ear lavage patient reports that his symptoms have resolved.  Reexamination reveals clear external auditory canals bilaterally.  Both tympanic membranes are pearly gray in appearance.  I will discharge patient home with a diagnosis of cerumen impaction.   Final Clinical Impressions(s) / UC Diagnoses   Final diagnoses:  Bilateral impacted cerumen     Discharge Instructions      Your symptoms today were caused by buildup of earwax in your external auditory canals.  Follow the discharge instructions regarding earwax buildup and prevention.  Return for reevaluation for return of symptoms.     ED Prescriptions   None    PDMP not reviewed this encounter.   Becky Augusta, NP 12/07/21 1041

## 2021-12-07 NOTE — Discharge Instructions (Addendum)
Your symptoms today were caused by buildup of earwax in your external auditory canals.  Follow the discharge instructions regarding earwax buildup and prevention.  Return for reevaluation for return of symptoms.

## 2021-12-10 ENCOUNTER — Ambulatory Visit
Admit: 2021-12-10 | Discharge: 2021-12-11 | Payer: MEDICARE | Attending: Physician Assistant | Primary: Physician Assistant

## 2021-12-10 DIAGNOSIS — Z9989 Dependence on other enabling machines and devices: Principal | ICD-10-CM

## 2021-12-10 DIAGNOSIS — G4733 Obstructive sleep apnea (adult) (pediatric): Principal | ICD-10-CM

## 2021-12-10 NOTE — Unmapped (Signed)
You are doing well on your CPAP machine and congratulate you.  I am glad that you are having a good experience.  I would recommend trying some facial moisturizer to help get a better seal.  If that does not work, you may need to either tighten your mask or shave.    A general guideline on CPAP accessory replacements  If a CPAP mask has an insert, that gets changed out monthly (nasal pillows, etc)  In general, CPAP masks are changed every 3 months.   The CPAP filters gets changed roughly twice monthly  The tubing is changed between 3-6 months  The water chamber is usually changed every 6 months    In general, your CPAP mask to be cleaned daily and your tubing and water chamber should be cleaned weekly.  Gently rub with soap and warm, drinking-quality water.  Avoid using stronger cleaning products as it may leave harmful residue  Rinse again thoroughly with warm, drinking-quality water.  You can let it dry on a flat surface on top of a towel to dry.  With the CPAP tubing, I recommend shaking this out thoroughly and hanging it over the shower curtain to allow it to dry

## 2021-12-11 NOTE — Unmapped (Signed)
Date of Service: 12/10/2021     Patient Name: Logan Quinn       MRN: [id number]       Date of Birth: 1981/05/15  Primary Care Physician: [name], FNP  Referring Provider: Loran Senters, *  DME: [facility]    Assessment and Plan:   Impression:   Logan Quinn is a 41 y.o. male with OSA on CPAP.    Severe obstructive sleep apnea: Logan Quinn is here for his compliance visit.  He received a new ResMed air sense 11 AutoSet on 09/05/2021.  This was a replacement for his inadequately functioning CPAP machine as they gave him a loaner while awaiting repair.  He does have severe obstructive sleep apnea which has been demonstrated in at least 2 studies.  The first in 2012 revealed an AHI of 51.3 /HR and the second in 2018, revealed a similar AHI.  He is currently on  CPAP 16 cm H20 EPR off and benefiting from better sleep quality and daytime energy.  He is quite compliant.  He currently encountering leaks and is currently using the ResMed AirFit F30 i full face mask.  I gave him some tips to improve things.  I recommended trialing a facial moisturizer.  If that is not successful, increasing the tightness of his mask or he can simply shave.  I also provided him with guidelines for the replacement of his accessories as well as cleaning.    Follow-up in 1 year or sooner if the need arises.      I personally spent 25 minutes face-to-face and non-face-to-face in the care of this patient, which includes all pre, intra, and post visit time on the date of service.      Subjective:   CC: CPAP compliance visit    History of Present Illness:     Logan Quinn is a 41 y.o. male and was last seen by me on 08/30/2021.      Logan Quinn was diagnosed via PSG with severe obstructive sleep apnea on 09/17/2010.  His overall AHI was 51.3 /HR and a follow-up Pap titration study recommended 15 cm H2O.  Compliance was an issue he reestablished care in 2018.  Studies were added.  A split study on 02/26/2017 again revealed severe obstructive sleep apnea with an AHI of 50 /HR and it was recommended that he be treated with CPAP 16 cm H2O EPR 2.  His device was not working properly and after being provided a loaner CPAP machine, his device was eventually replaced.  With the new ResMed air sense 11 AutoSet.  He received this from [facility]care specialist on 09/05/2021 and is quite happy with it.  He says that he loves it.  It seems to work better than his last machine and he finds that he uses it without issue.  He has good sleep quality as well as daytime energy.  He is currently using the mask of the ResMed AirFit F30 i.  He does have some leaks but also a mustache and beard.  He goes to bed between 9 PM and though it can be as late as 10:30 PM.  He usually falls asleep quite readily.  He does take ramelteon as a sleep aid.  He typically rises between 5:30 AM-6 AM feeling refreshed.  He generally has good energy during the day but does occasionally take a nap.  He does not consume caffeine often, only when he goes out.  He is  pretty much switched to water as his fluid choice.    Review of Systems:  A 14-system review was completed and found to be negative except for what is mentioned in the HPI.     PMH:  Past Medical History:   Diagnosis Date   ??? Acne    ??? Acquired hypothyroidism 01/09/2021   ??? Allergic    ??? Anxiety    ??? Depression    ??? Diabetes mellitus (CMS-HCC) Dx 2013    Type II   ??? GERD (gastroesophageal reflux disease)    ??? Gout    ??? Headache    ??? Hypertension    ??? IBS (irritable bowel syndrome)    ??? 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 Liver disease    ??? Migraines    ??? Mild intermittent asthma without complication 10/11/2021   ??? Morbid obesity with BMI of 60.0-69.9, adult (CMS-HCC)    ??? 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 Obstructive sleep apnea    ??? OSA on CPAP    ??? Retinal detachment 04/2014   ??? Severe obstructive sleep apnea    ??? Trapezius muscle strain 12/07/2013   ??? Urinary incontinence, nocturnal enuresis    ??? Venous insufficiency        Past Surgical Hx:  Past Surgical History:   Procedure Laterality Date   ??? EYE SURGERY  04/2014   ??? PR COLONOSCOPY FLX DX W/COLLJ SPEC WHEN PFRMD N/A 03/24/2013    Procedure: COLONOSCOPY, FLEXIBLE, PROXIMAL TO SPLENIC FLEXURE; DIAGNOSTIC, W/WO COLLECTION SPECIMEN BY BRUSH OR WASH;  Surgeon: Clint Bolder, MD;  [facility]: [facility];  Service: Gastroenterology   ??? PR EYE SURG POST SGMT PROC UNLISTED Left     pneumatic retinopexy OS   ??? PR UPPER GI ENDOSCOPY,DIAGNOSIS N/A 02/02/2013    Procedure: UGI ENDO, INCLUDE ESOPHAGUS, STOMACH, & DUODENUM &/OR JEJUNUM; DX W/WO COLLECTION SPECIMN, BY BRUSH OR WASH;  Surgeon: Malcolm Metro, MD;  [facility]: [facility];  Service: Gastroenterology   ??? [place] PYLORIC STENOSIS (Singer HISTORICAL RESULT)         FamHx:  Family History   Problem Relation Age of Onset   ??? Cancer Maternal Grandfather         Stomach Cancer   ??? Hearing loss Maternal Grandfather    ??? Cancer Paternal Grandfather         Bone Cancer, Lung Cancer   ??? COPD Paternal Grandmother    ??? Arthritis Paternal Grandmother    ??? Depression Paternal Grandmother    ??? Diabetes Mother    ??? Heart disease Mother    ??? Migraines Mother    ??? Arthritis Mother    ??? Depression Mother    ??? GER disease Mother    ??? Hypertension Mother    ??? Angina Mother    ??? COPD Mother    ??? Glaucoma Mother    ??? Hearing loss Mother    ??? Cataracts Mother    ??? Diabetes Sister    ??? Migraines Sister    ??? Asthma Sister    ??? Depression Sister    ??? Hearing loss Sister    ??? Diabetes Brother    ??? Asthma Brother    ??? Diabetes Maternal Grandmother    ??? Heart disease Maternal Grandmother    ??? Migraines Maternal Grandmother    ??? Depression Maternal Grandmother    ??? Angina Maternal Grandmother    ??? Hypertension Maternal Grandmother    ???  Stroke Maternal Grandmother    ??? Diabetes Maternal Uncle    ??? Hearing loss Maternal Uncle    ??? Diabetes Maternal Uncle         resulted in need for kidney transplant   ??? Liver disease Maternal Uncle    ??? Kidney disease Maternal Uncle         needed kidney transplant   ??? Asthma Brother    ??? No Known Problems Father    ??? No Known Problems Paternal Aunt    ??? No Known Problems Paternal Uncle    ??? No Known Problems Other    ??? Colorectal Cancer Neg Hx    ??? Esophageal cancer Neg Hx    ??? Liver cancer Neg Hx    ??? Pancreatic cancer Neg Hx    ??? Stomach cancer Neg Hx    ??? Amblyopia Neg Hx    ??? Blindness Neg Hx    ??? Retinal detachment Neg Hx    ??? Strabismus Neg Hx    ??? Macular degeneration Neg Hx    ??? Anesthesia problems Neg Hx    ??? Broken bones Neg Hx    ??? Clotting disorder Neg Hx    ??? Collagen disease Neg Hx    ??? Dislocations Neg Hx    ??? Fibromyalgia Neg Hx    ??? Gout Neg Hx    ??? Hemophilia Neg Hx    ??? Osteoporosis Neg Hx    ??? Rheumatologic disease Neg Hx    ??? Scoliosis Neg Hx    ??? Severe sprains Neg Hx    ??? Sickle cell anemia Neg Hx    ??? Spinal Compression Fracture Neg Hx    ??? Melanoma Neg Hx    ??? Basal cell carcinoma Neg Hx    ??? Squamous cell carcinoma Neg Hx    ??? Deep vein thrombosis Neg Hx    ??? Thyroid disease Neg Hx        Social History:  Social History     Socioeconomic History   ??? Marital status: Married   Tobacco Use   ??? Smoking status: Never     Passive exposure: Never   ??? Smokeless tobacco: Never   Vaping Use   ??? Vaping Use: Never used   Substance and Sexual Activity   ??? Alcohol use: Not Currently     Comment: rare social   ??? Drug use: Never   ??? Sexual activity: Yes     Partners: Female     Birth control/protection: None   Other Topics Concern   ??? Do you use sunscreen? Yes   ??? Tanning bed use? No   ??? Are you easily burned? No   ??? Excessive sun exposure? No   ??? Blistering sunburns? No     Social Determinants of Health     Financial Resource Strain: Low Risk    ??? Difficulty of Paying Living Expenses: Not very hard   Food Insecurity: No Food Insecurity   ??? Worried About Running Out of Food in the Last Year: Never true   ??? Ran Out of Food in the Last Year: Never true   Transportation Needs: No Transportation Needs   ??? Lack of Transportation (Medical): No   ??? Lack of Transportation (Non-Medical): No       Medications:    Current Outpatient Medications:   ???  acetaminophen (TYLENOL) 500 MG tablet, Take 2 tablets (1,000 mg total) by mouth Three (3) times a day., Disp: 30 tablet,  Rfl: 0  ???  adalimumab (HUMIRA,CF, PEN) 80 mg/0.8 mL PnKt, Inject the contents of 1 pen (80mg ) under the skin once a week., Disp: 4 each, Rfl: 11  ???  albuterol 2.5 mg /3 mL (0.083 %) nebulizer solution, Inhale 3 mL (2.5 mg total) by nebulization every four (4) hours as needed for wheezing., Disp: 180 mL, Rfl: 2  ???  albuterol HFA 90 mcg/actuation inhaler, Inhale 2 puffs every six (6) hours as needed for wheezing., Disp: 8 g, Rfl: 5  ???  ALPRAZolam (XANAX) 0.5 MG tablet, Takes 1 tablet nightly and 1 tablet a day when needed, Disp: , Rfl:   ???  ascorbic acid (VITAMIN C ORAL), Take 1 capsule by mouth nightly., Disp: , Rfl:   ???  blood sugar diagnostic (ACCU-CHEK GUIDE TEST STRIPS) Strp, by Other route Three (3) times a day before meals., Disp: 300 each, Rfl: 1  ???  budesonide-formoteroL (SYMBICORT) 80-4.5 mcg/actuation inhaler, Inhale 2 puffs Two (2) times a day., Disp: 10.2 g, Rfl: 11  ???  cetirizine (ZYRTEC) 10 MG tablet, Take 1 tablet (10 mg total) by mouth daily., Disp: 90 tablet, Rfl: 1  ???  chlorhexidine (PERIDEX) 0.12 % solution, Rinse with 1/2 ounce by mouth for 30 seconds then spit out. use twice a day, Disp: , Rfl:   ???  clindamycin (CLEOCIN T) 1 % external solution, clindamycin phosphate 1 % topical solution  APPLY TOPICALLY TO GROIN AREA TWICE A DAY, Disp: , Rfl:   ???  clindamycin (CLEOCIN T) 1 % lotion, APPLY TOPICALLY AT NIGHT TO INFLAMED AREAS AS NEEDED FOR FLARES, Disp: , Rfl:   ???  clobetasoL (TEMOVATE) 0.05 % Gel, , Disp: , Rfl:   ???  clobetasoL (TEMOVATE) 0.05 % ointment, Apply daily to painful affected areas if needed for flares, then stop, Disp: 15 g, Rfl: 1  ???  colchicine (MITIGARE) 0.6 mg cap capsule, TAKE 2 CAPSULES BY MOUTH ON DAY ONE, FOLLOWED BY ONE CAPSULE ONE HOUR LATER, ON DAY 2 TAKE ONE CAPSULE BY MOUTH TWICEA DAY UNTIL RESOLVED, Disp: 60 capsule, Rfl: 0  ???  cyclobenzaprine (FLEXERIL) 10 MG tablet, Take 1 tablet (10 mg total) by mouth Three (3) times a day as needed for muscle spasms., Disp: 60 tablet, Rfl: 1  ???  dextroamphetamine sulfate (DEXTROSTAT) 10 MG tablet, Take 2 tablets (20 mg total) by mouth two (2) times a day., Disp: , Rfl:   ???  dicyclomine (BENTYL) 10 mg capsule, Take 1 capsule (10 mg total) by mouth Three (3) times a day., Disp: 270 capsule, Rfl: 1  ???  diphenoxylate-atropine (LOMOTIL) 2.5-0.025 mg per tablet, Take 1 tablet by mouth two (2) times a day as needed for diarrhea., Disp: 30 tablet, Rfl: 5  ???  dulaglutide (TRULICITY) 1.5 mg/0.5 mL PnIj, Inject 0.5 mL (1.5 mg total) under the skin every seven (7) days., Disp: 2 mL, Rfl: 5  ???  DULoxetine (CYMBALTA) 60 MG capsule, Take 1 capsule (60 mg total) by mouth Two (2) times a day., Disp: 180 capsule, Rfl: 0  ???  empty container (SHARPS CONTAINER) Misc, Use as directed to dispose of Humira needles, Disp: 1 each, Rfl: 2  ???  erenumab-aooe (AIMOVIG AUTOINJECTOR) 140 mg/mL AtIn, Inject 140 mg under the skin every thirty (30) days., Disp: 3 mL, Rfl: 1  ???  famotidine (PEPCID) 20 MG tablet, Take 1 tablet (20 mg total) by mouth Two (2) times a day., Disp: 180 tablet, Rfl: 1  ???  fluoride, sodium, 1.1 % Pste, ,  Disp: , Rfl:   ???  fluticasone propionate (FLONASE) 50 mcg/actuation nasal spray, 1 spray into each nostril Two (2) times a day., Disp: 16 g, Rfl: 5  ???  glipiZIDE (GLUCOTROL) 10 MG tablet, Take 2 tablets (20 mg total) by mouth Two (2) times a day (30 minutes before a meal)., Disp: 360 tablet, Rfl: 1  ???  HUMALOG U-100 INSULIN 100 unit/mL injection, INJECT 120 UNITS UNDER THE SKIN DAILY VIA OMNIPOD, Disp: 40 mL, Rfl: 5  ???  hydroCHLOROthiazide (HYDRODIURIL) 25 MG tablet, Take 1 tablet (25 mg total) by mouth daily., Disp: 90 tablet, Rfl: 3  ???  hydrocortisone (PROCTOSOL HC) 2.5 % rectal cream, Insert 1 application into the rectum two (2) times a day as needed., Disp: 28.35 g, Rfl: 2  ???  hydrOXYzine (ATARAX) 25 MG tablet, Take 1 tablet (25 mg total) by mouth Three (3) times a day as needed. (Patient taking differently: Take 1 tablet (25 mg total) by mouth four (4) times a day as needed.), Disp: 120 tablet, Rfl: 1  ???  ibuprofen (ADVIL,MOTRIN) 800 MG tablet, Take 1 tablet (800 mg total) by mouth every eight (8) hours as needed., Disp: 90 tablet, Rfl: 1  ???  inhalational spacing device (AEROCHAMBER MV) Spcr, 1 each by Miscellaneous route every four (4) hours as needed (with inhaler)., Disp: 1 each, Rfl: 0  ???  insulin pump cart,automated,BT (OMNIPOD 5 G6 PODS, GEN 5,) Crtg, Change POD every 48 hours., Disp: 3 each, Rfl: 11  ???  insulin pump cartridge Crtg, Inject 200 Units under the skin every other day. Change every 48 hours, Disp: 15 each, Rfl: 11  ???  insulin syringe-needle U-100 1 mL 31 gauge x 5/16 (8 mm) Syrg, , Disp: , Rfl:   ???  ketoconazole (NIZORAL) 2 % cream, APPLY TO AFFECTED AREA OF THE GROIN DAILY, Disp: , Rfl:   ???  lancets (ACCU-CHEK FASTCLIX LANCET DRUM) Misc, Use to check blood sugars 3 times a day before meals or as directed, Disp: 200 each, Rfl: 5  ???  levothyroxine (SYNTHROID) 50 MCG tablet, Take 1 tablet (50 mcg total) by mouth daily., Disp: 90 tablet, Rfl: 1  ???  lidocaine (XYLOCAINE) 5 % ointment, lidocaine 5 % topical ointment, Disp: , Rfl:   ???  montelukast (SINGULAIR) 10 mg tablet, Take 1 tablet (10 mg total) by mouth daily., Disp: 90 tablet, Rfl: 3  ???  multivitamin (MULTIPLE VITAMIN ORAL), Take 1 tablet by mouth daily., Disp: , Rfl:   ???  nystatin (MYCOSTATIN) 100,000 unit/gram powder, APPLY TO FOLDS ON ABDOMEN THREE TIMES DAILY, Disp: , Rfl:   ???  ondansetron (ZOFRAN) 4 MG tablet, take 1-2 tablets by mouth twice a day if needed for nausea, Disp: , Rfl:   ???  ondansetron (ZOFRAN-ODT) 4 MG disintegrating tablet, Take 1 tablet (4 mg total) by mouth every eight (8) hours as needed for nausea for up to 15 doses., Disp: 15 tablet, Rfl: 1  ???  oxybutynin (DITROPAN) 5 MG tablet, Take 1 tablet (5 mg total) by mouth Two (2) times a day., Disp: 180 tablet, Rfl: 1  ???  pantoprazole (PROTONIX) 40 MG tablet, Take 1 tablet (40 mg total) by mouth Two (2) times a day., Disp: 180 tablet, Rfl: 1  ???  polyethylene glycol (GLYCOLAX) 17 gram/dose powder, , Disp: , Rfl:   ???  promethazine (PHENERGAN) 25 MG tablet, TAKE 1 TO 2 TABLETS BY MOUTH AT BEDTIME AS NEEDED FOR NAUSEA, Disp: , Rfl:   ???  propranoloL (INDERAL LA) 120 mg 24 hr capsule, Take 1 capsule (120 mg total) by mouth daily., Disp: 90 capsule, Rfl: 1  ???  ramelteon (ROZEREM) 8 mg tablet, Take 1 tablet (8 mg total) by mouth nightly., Disp: , Rfl:   ???  sour cherry extract (TART CHERRY EXTRACT) 1,000 mg cap, , Disp: , Rfl:   ???  topiramate (TOPAMAX) 50 MG tablet, Take 1.5 tablets (75 mg total) by mouth Two (2) times a day., Disp: 270 tablet, Rfl: 1  ???  blood-glucose meter kit, Use as directed, Disp: 1 each, Rfl: 0    Allergies:  Allergies   Allergen Reactions   ??? Erythromycin Nausea And Vomiting     Other reaction(s): Vomiting   ??? Penicillins Rash, Swelling and Hives   ??? Sulfa (Sulfonamide Antibiotics) Nausea And Vomiting and Nausea Only   ??? Other             Objective:     Physical Exam:     Vitals:    12/10/21 1528 12/10/21 1543   BP: 94/56 126/63   BP Site: R Arm R Arm   BP Position: Sitting Sitting   BP Cuff Size: X-Large X-Large   Pulse: 78 78   Weight: (!) 227.3 kg (501 lb 1.6 oz)    Height: 180.3 cm (5' 11)      Body mass index is 69.89 kg/m??.      Awake alert appropriate  EOMI  Tongue midline  Face symmetric  Clear speech  Follows commands  Moves all extremities spontaneously and antigravity  Normal gait      CPAP compliance and therapy notes:  DME:: [facility]  Device: ResMed air sense 11 AutoSet  Set up date: 09/05/2021  Mask: ResMed air sense 11 AutoSet  Date: 11/10/2021 - 12/09/2021: 30 days  >4 hours usage: 93%  Average usage on days used: 7 hours 23 minutes  Setting: CPAP 16 cm H2O EPR off  AHI: 3.3      Data/ Lab/ Inventory Review:   Polysomnogram results as above  History obtained from patient  Imaging   Blood/ serum laboratory tests  No results found for: CBC  Ferritin   Date Value Ref Range Status   03/24/2019 87.7 27.0 - 377.0 ng/mL Final     Iron Saturation (%)   Date Value Ref Range Status   03/24/2019 27 20 - 50 % Final     TSH   Date Value Ref Range Status   01/09/2021 1.637 0.550 - 4.780 uIU/mL Final     Free T4   Date Value Ref Range Status   07/08/2018 0.94 0.71 - 1.40 ng/dL Final     No results found for: VITAMINB12  CO2   Date Value Ref Range Status   01/18/2021 28.7 20.0 - 31.0 mmol/L Final           Author:  Patrcia Dolly 12/10/2021 6:00 PM    Note - This record has been created using [employer]. Chart creation errors have been sought, but may not always have been located. Such creation errors do not reflect on the standard of medical care.

## 2021-12-17 MED ORDER — EMPTY CONTAINER
2 refills | 0 days
Start: 2021-12-17 — End: ?

## 2021-12-17 NOTE — Unmapped (Signed)
[facility] Refill Coordination Note    Specialty Medication(s) to be Shipped:   Inflammatory Disorders: Humira     Other medication(s) to be shipped: [employer], DOB: Aug 16, 1980  Phone: [phone] (home)     All above HIPAA information was verified with patient.     Was a Nurse, learning disability used for this call? No    Completed refill call assessment today to schedule patient's medication shipment from the [facility] [phone]).  All relevant notes have been reviewed.     Specialty medication(s) and dose(s) confirmed: Regimen is correct and unchanged.   Changes to medications: Jameek reports no changes at this time.  Changes to insurance: No  New side effects reported not previously addressed with a pharmacist or physician: None reported  Questions for the pharmacist: No    Confirmed patient received a Conservation officer, historic buildings and a Surveyor, mining with first shipment. The patient will receive a drug information handout for each medication shipped and additional FDA Medication Guides as required.       DISEASE/MEDICATION-SPECIFIC INFORMATION        For patients on injectable medications: Patient currently has 1 doses left.  Next injection is scheduled for 12/20/2021.    SPECIALTY MEDICATION ADHERENCE     Medication Adherence    Patient reported X missed doses in the last month: 0  Specialty Medication: Humira  Patient is on additional specialty medications: No  Any gaps in refill history greater than 2 weeks in the last 3 months: no  Demonstrates understanding of importance of adherence: yes  Informant: patient  Reliability of informant: reliable  Confirmed plan for next specialty medication refill: delivery by pharmacy  Refills needed for supportive medications: not needed        Were doses missed due to medication being on hold? No    Humira CF 80mg /0.98ml Inj: 7 days of medicine on hand     REFERRAL TO PHARMACIST     Referral to the pharmacist: Not needed    [facility]     Shipping address confirmed in Epic.     Delivery Scheduled: Yes, Expected medication delivery date: 12/27/2021.     Medication will be delivered via Same Day Courier to the prescription address in Epic WAM.    Rodger Giangregorio D Eldo Umanzor   [facility] Specialty Technician

## 2021-12-21 DIAGNOSIS — G43909 Migraine, unspecified, not intractable, without status migrainosus: Principal | ICD-10-CM

## 2021-12-21 MED ORDER — AIMOVIG AUTOINJECTOR 140 MG/ML SUBCUTANEOUS AUTO-INJECTOR
SUBCUTANEOUS | 1 refills | 0 days | Status: CP
Start: 2021-12-21 — End: 2022-12-21

## 2021-12-21 MED FILL — EMPTY CONTAINER: 120 days supply | Qty: 1 | Fill #0

## 2021-12-21 MED FILL — HUMIRA(CF) PEN 80 MG/0.8 ML SUBCUTANEOUS KIT: SUBCUTANEOUS | 28 days supply | Qty: 4 | Fill #5

## 2021-12-21 NOTE — Unmapped (Signed)
Patient is requesting the following refill  Requested Prescriptions     Pending Prescriptions Disp Refills   ??? erenumab-aooe (AIMOVIG AUTOINJECTOR) 140 mg/mL AtIn 3 mL 1     Sig: Inject 140 mg under the skin every thirty (30) days.       Recent Visits  Date Type Provider Dept   11/28/21 Office Visit [name] Rosita Fire, FNP [facility] [address] [facility]   10/10/21 Office Visit [name] Rosita Fire, FNP [place] [facility] [address] [facility]   09/11/21 Office Visit [name] Rosita Fire, FNP [place] [facility] [address] At [facility]   07/13/21 Office Visit [name] Rosita Fire, FNP [place] [facility]   04/12/21 Office Visit [name] Rosita Fire, FNP [place] [facility]   02/05/21 Office Visit [name] Rosita Fire, FNP [place] [facility]   01/09/21 Office Visit [name] Rosita Fire, FNP [place] [facility]   Showing recent visits within past 365 days with a meds authorizing provider and meeting all other requirements  Future Appointments  Date Type Provider Dept   01/02/22 Appointment [name] Rosita Fire, FNP [place] [facility] [address] [facility]   Showing future appointments within next 365 days with a meds authorizing provider and meeting all other requirements

## 2021-12-25 MED ORDER — GLIPIZIDE 10 MG TABLET
ORAL_TABLET | Freq: Two times a day (BID) | ORAL | 1 refills | 90 days
Start: 2021-12-25 — End: 2022-12-25

## 2021-12-26 MED ORDER — GLIPIZIDE 10 MG TABLET
ORAL_TABLET | Freq: Two times a day (BID) | ORAL | 1 refills | 90 days | Status: CP
Start: 2021-12-26 — End: 2022-12-26

## 2021-12-26 NOTE — Unmapped (Signed)
Patient is requesting the following refill  Requested Prescriptions     Pending Prescriptions Disp Refills    glipiZIDE (GLUCOTROL) 10 MG tablet 360 tablet 1     Sig: Take 2 tablets (20 mg total) by mouth Two (2) times a day (30 minutes before a meal).       Recent Visits  Date Type Provider Dept   11/28/21 Office Visit [name] Rosita Fire, FNP [place] [facility] [address] [facility]   10/10/21 Office Visit [name] Rosita Fire, FNP [place] [facility] [address] [facility]   09/11/21 Office Visit [name] Rosita Fire, FNP [place] [facility] [address] [facility]   07/13/21 Office Visit [name] Rosita Fire, FNP [place] [facility]   04/12/21 Office Visit [name] Rosita Fire, FNP [place] [facility]   02/05/21 Office Visit [name] Rosita Fire, FNP [place] [facility]   01/09/21 Office Visit [name] Rosita Fire, FNP [place] [facility]   Showing recent visits within past 365 days with a meds authorizing provider and meeting all other requirements  Future Appointments  Date Type Provider Dept   01/02/22 Appointment [name] Rosita Fire, FNP [place] [facility] [address] [facility]   Showing future appointments within next 365 days with a meds authorizing provider and meeting all other requirements       Labs: A1c:   Hemoglobin A1C (%)   Date Value   10/10/2021 7.3 (A)   09/15/2014 6.0

## 2022-01-01 ENCOUNTER — Ambulatory Visit (INDEPENDENT_AMBULATORY_CARE_PROVIDER_SITE_OTHER): Payer: Medicare Other | Admitting: Nurse Practitioner

## 2022-01-02 ENCOUNTER — Ambulatory Visit: Admit: 2022-01-02 | Discharge: 2022-01-03 | Payer: MEDICARE | Attending: Family | Primary: Family

## 2022-01-02 DIAGNOSIS — E039 Hypothyroidism, unspecified: Principal | ICD-10-CM

## 2022-01-02 DIAGNOSIS — R7989 Other specified abnormal findings of blood chemistry: Principal | ICD-10-CM

## 2022-01-02 DIAGNOSIS — J4520 Mild intermittent asthma, uncomplicated: Principal | ICD-10-CM

## 2022-01-02 DIAGNOSIS — I10 Essential (primary) hypertension: Principal | ICD-10-CM

## 2022-01-02 DIAGNOSIS — E1165 Type 2 diabetes mellitus with hyperglycemia: Principal | ICD-10-CM

## 2022-01-02 DIAGNOSIS — Z794 Long term (current) use of insulin: Principal | ICD-10-CM

## 2022-01-02 DIAGNOSIS — G4733 Obstructive sleep apnea (adult) (pediatric): Principal | ICD-10-CM

## 2022-01-02 MED ORDER — INSULIN GLARGINE (U-100) 100 UNIT/ML (3 ML) SUBCUTANEOUS PEN
Freq: Every evening | SUBCUTANEOUS | 1 refills | 180 days | Status: CP
Start: 2022-01-02 — End: 2022-01-02

## 2022-01-02 MED ORDER — INSULIN GLARGINE (U-100) 100 UNIT/ML SUBCUTANEOUS SOLUTION
Freq: Every evening | SUBCUTANEOUS | 3 refills | 200.00000 days | Status: CP
Start: 2022-01-02 — End: ?

## 2022-01-02 NOTE — Unmapped (Signed)
Logan Quinn wants Lantus vials and not the pens.  Clarified with [facility] pharmacist they will fill Lantus vials.  [facility] got both RX's.  She said they put pens back and she will need to order the vials and that [name] was aware and was good with that.

## 2022-01-02 NOTE — Unmapped (Signed)
Assessment and Plan:            Diagnosis ICD-10-CM Associated Orders   1. Type 2 diabetes mellitus with hyperglycemia, with long-term current use of insulin (CMS-HCC)  E11.65 HGB A1c 7.3 (10/10/21). Too soon for 3 month A1c. DM reasonably controlled. Continue glipizide 20 mg twice daily before meals,  Trulicity 1.5 mg weekly, and Humalog insulin via Omnipod. Encouraged patient to continue carb controlled diet and regular exercise.        insulin glargine (LANTUS) 100 unit/mL injection    Z79.4       2. Acquired hypothyroidism  E03.9 TSH 1.637 (01/09/21). Continue levothyroxine 50 mcg daily.       3. Primary hypertension  I10 BP at goal (128/72 in clinic today). Continue hydrochlorothiazide 25 mg daily. Reviewed low sodium diet and encouraged regular exercise. Advised to continue to monitor and log at-home BP readings. Contact clinic if readings become elevated.       4. Mild intermittent asthma without complication  J45.20 Asthma controlled. Continue daily maintenance with  Symbicort BID, Singulair 10 mg daily  and rescue inhaler, albuterol, as needed.        5. Obstructive sleep apnea  G47.33 Compliant with nightly CPAP. Managed by neurology.       6. Low testosterone in male  R79.89 Previously referred to urology (11/28/21). Patient intends to follow up with specialist.           I personally spent 45 minutes face-to-face and non-face-to-face in the care of this patient, which includes all pre, intra, and post visit time on the date of service.    Return for lab only for HGB A1c in approximately 2 wks.  Next follow up in 3 months .    HPI:      Logan Quinn is here for   Chief Complaint   Patient presents with    Diabetes     Fasting.    Hypothyroidism    Hypertension    Asthma    Sleep Apnea    Immunizations     Agrees to get Prevnar 20 today.     Asthma: Patient presents for  follow-up of asthma. Current symptoms Non-productive cough. Current medications: Rescue inhaler albuterol  (last used last month), daily maintenance Symbicort. He is seeing Pulmonology. has a  action plan.     Last month in a horse barn with dust and cob webs leading to mild flare.     Diabetes: Patient presents for follow up of diabetes.  A1C goal is <8.  Diabetes has customarily not been at goal (complicated by: recent steroid injections).  Current symptoms include: hyperglycemia, increase appetite, paresthesia of the feet, polydipsia, and polyuria. Symptoms have been intermittent. Patient denies foot ulcerations, hypoglycemia , nausea, visual disturbances, and vomiting. Evaluation to date has included: hemoglobin A1C and microalbuminuria.  Home sugars:  300's. Yesterday afternoon, BS 396 . Current treatment: Continued metformin and insulin using Omnipod which delivers 0.5 units up to 30 unity hourly based on BS reading,   which has been somewhat effective.  Doing regular exercise: no.      CGM via Dexcom device. Omnipod for insulin delivery. Originally Omnipod DASH, now it is Omnipod 5.   Patient reports he is drinking mostly water, no soda. Restricting carb intake.     Answers submitted by the patient for this visit:  Diabetes Questionnaire (Submitted on 12/26/2021)  Chief Complaint: Diabetes problem  Diabetes type: type 2  MedicAlert ID: No  Disease duration: 9 Years  blurred vision: No  chest pain: No  fatigue: No  foot paresthesias: No  foot ulcerations: No  polydipsia: Yes  polyphagia: Yes  polyuria: Yes  visual change: No  weakness: No  weight loss: No  Symptom course: worsening  confusion: No  dizziness: No  headaches: Yes  hunger: Yes  mood changes: Yes  nervous/anxious: No  pallor: No  seizures: No  sleepiness: No  speech difficulty: No  sweats: Yes  tremors: No  blackouts: No  hospitalization: No  nocturnal hypoglycemia: No  required assistance: No  required glucagon: No  CVA: No  heart disease: No  impotence: Yes  nephropathy: No  peripheral neuropathy: No  PVD: No  retinopathy: No  CAD risks: hypertension, obesity, sedentary lifestyle, stress  Current treatments: diet, insulin pump, oral agent (monotherapy)  Treatment compliance: all of the time  Dose schedule: pre-breakfast, pre-lunch, pre-dinner, at bedtime  Given by: patient, significant other  Injection sites: abdominal wall, arms, thighs  Monitoring compliance: excellent  Blood glucose trend: increasing rapidly  breakfast time: 5-6 am  breakfast glucose level: 180-200  lunch time: 12-1 pm  lunch glucose level: >200  dinner time: 7-8 pm  dinner glucose level: >200  High score: >200  Overall: >200  Weight trend: fluctuating minimally  Current diet: diabetic, low fat/cholesterol, low salt  Meal planning: calorie counting, carbohydrate counting  Exercise: intermittently  Dietitian visit: No  Eye exam current: Yes  Sees podiatrist: Yes      Hypertension: Patient presents for follow-up of hypertension. Blood pressure goal < 140/90.  Hypertension has customarily been at goal complicated by DM, obesity, lack of physical activity.  Home blood pressure readings: did not bring log. Salt intake and diet: salt not added to cooking and salt shaker not on table. Associated signs and symptoms: none. Patient denies: blurred vision, chest pain, dyspnea, headache, neck aches, orthopnea, palpitations, paroxysmal nocturnal dyspnea, peripheral edema, pulsating in the ears, and tiredness/fatigue. Medication compliance: taking as prescribed. He is not doing regular exercise.      Hypothyroid: Patient presents for follow-up of hypothyroidism. Current symptoms: diarrhea . Patient denies change in energy level, heat / cold intolerance, nervousness, palpitations, and weight changes. Symptoms have progressed to a point and plateaued. He is taking medications on a regular basis. Current therapy includes: levothyroxine 50 mcg daily.    Following with orthopedics for ongoing right shoulder pain. He describes pain as pinching pain with movement. Abduction limited.   Recent imaging guided shoulder injections, both shoulders.   He was advised blood sugars expected to go up for a week but running high since July 5th when injections performed.   High as 400   This AM 253      Dextroamphetamine twice daily, appetite extremely decreased.   Eats small portions of protein. Eats a lot of chicken for supper.   Limiting intake of sides such as mac and cheese, mashed potatoes,     Family hx (PGF) of cancer connected to stomach, kidney and colon. Football size tumor.   PGF deceased secondary to CA.          ROS:      Comprehensive 10 point ROS negative unless otherwise stated in the HPI.        [facility] Components:     Medication adherence and barriers to the treatment plan have been addressed. Opportunities to optimize healthy behaviors have been discussed. Patient / caregiver voiced understanding.    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)     Obstructive sleep apnea     OSA on CPAP     Retinal detachment 04/2014    Severe obstructive sleep apnea     Trapezius muscle strain 12/07/2013    Urinary incontinence, nocturnal enuresis     Venous insufficiency      Past Surgical History:   Procedure Laterality Date    EYE SURGERY  04/2014    PR COLONOSCOPY FLX DX W/COLLJ SPEC WHEN PFRMD N/A 03/24/2013    Procedure: COLONOSCOPY, FLEXIBLE, PROXIMAL TO SPLENIC FLEXURE; DIAGNOSTIC, W/WO COLLECTION SPECIMEN BY BRUSH OR WASH;  Surgeon: Clint Bolder, MD;  [facility];  Service: Gastroenterology    PR EYE SURG POST SGMT PROC UNLISTED Left     pneumatic retinopexy OS    PR UPPER GI ENDOSCOPY,DIAGNOSIS N/A 02/02/2013    Procedure: UGI ENDO, INCLUDE ESOPHAGUS, STOMACH, & DUODENUM &/OR JEJUNUM; DX W/WO COLLECTION SPECIMN, BY BRUSH OR WASH;  Surgeon: Malcolm Metro, MD;  Location: [facility];  Service: Gastroenterology    [place] PYLORIC STENOSIS ([place] HISTORICAL RESULT)         Family History:     Family History   Problem Relation Age of Onset    Cancer Maternal Grandfather         Stomach Cancer    Hearing loss Maternal Grandfather     Cancer Paternal Grandfather         Bone Cancer, Lung Cancer    COPD Paternal Grandmother     Arthritis Paternal Grandmother     Depression Paternal Grandmother     Diabetes Mother     Heart disease Mother     Migraines Mother     Arthritis Mother     Depression Mother     GER disease Mother     Hypertension Mother     Angina Mother     COPD Mother     Glaucoma Mother     Hearing loss Mother     Cataracts Mother     Diabetes Sister     Migraines Sister     Asthma Sister     Depression Sister     Hearing loss Sister     Diabetes Brother     Asthma Brother     Diabetes Maternal Grandmother     Heart disease Maternal Grandmother     Migraines Maternal Grandmother     Depression Maternal Grandmother     Angina Maternal Grandmother     Hypertension Maternal Grandmother     Stroke Maternal Grandmother     Diabetes Maternal Uncle     Hearing loss Maternal Uncle     Diabetes Maternal Uncle         resulted in need for kidney transplant  Liver disease Maternal Uncle     Kidney disease Maternal Uncle         needed kidney transplant    Asthma Brother     No Known Problems Father     No Known Problems Paternal Aunt     No Known Problems Paternal Uncle     No Known Problems Other     Colorectal Cancer Neg Hx     Esophageal cancer Neg Hx     Liver cancer Neg Hx     Pancreatic cancer Neg Hx     Stomach cancer Neg Hx     Amblyopia Neg Hx     Blindness Neg Hx     Retinal detachment Neg Hx Strabismus Neg Hx     Macular degeneration Neg Hx     Anesthesia problems Neg Hx     Broken bones Neg Hx     Clotting disorder Neg Hx     Collagen disease Neg Hx     Dislocations Neg Hx     Fibromyalgia Neg Hx     Gout Neg Hx     Hemophilia Neg Hx     Osteoporosis Neg Hx     Rheumatologic disease Neg Hx     Scoliosis Neg Hx     Severe sprains Neg Hx     Sickle cell anemia Neg Hx     Spinal Compression Fracture Neg Hx     Melanoma Neg Hx     Basal cell carcinoma Neg Hx     Squamous cell carcinoma Neg Hx     Deep vein thrombosis Neg Hx     Thyroid disease Neg Hx        Social History:     Social History     Tobacco Use    Smoking status: Never     Passive exposure: Never    Smokeless tobacco: Never   Vaping Use    Vaping Use: Never used   Substance Use Topics    Alcohol use: Never     Comment: rare social    Drug use: Never       Allergies:     Erythromycin, Penicillins, Sulfa (sulfonamide antibiotics), and Other    Current Medications:     Current Outpatient Medications   Medication Sig Dispense Refill    acetaminophen (TYLENOL) 500 MG tablet Take 2 tablets (1,000 mg total) by mouth Three (3) times a day. 30 tablet 0    adalimumab (HUMIRA,CF, PEN) 80 mg/0.8 mL PnKt Inject the contents of 1 pen (80mg ) under the skin once a week. 4 each 11    albuterol 2.5 mg /3 mL (0.083 %) nebulizer solution Inhale 3 mL (2.5 mg total) by nebulization every four (4) hours as needed for wheezing. 180 mL 2    albuterol HFA 90 mcg/actuation inhaler Inhale 2 puffs every six (6) hours as needed for wheezing. 8 g 5    ALPRAZolam (XANAX) 0.5 MG tablet Takes 1 tablet nightly and 1 tablet a day when needed      ascorbic acid (VITAMIN C ORAL) Take 1 capsule by mouth nightly.      blood sugar diagnostic (ACCU-CHEK GUIDE TEST STRIPS) Strp by Other route Three (3) times a day before meals. 300 each 1    blood-glucose meter kit Use as directed 1 each 0    budesonide-formoteroL (SYMBICORT) 80-4.5 mcg/actuation inhaler Inhale 2 puffs Two (2) times a day. 10.2 g 11    cetirizine (ZYRTEC) 10 MG tablet Take  1 tablet (10 mg total) by mouth daily. 90 tablet 1    chlorhexidine (PERIDEX) 0.12 % solution Rinse with 1/2 ounce by mouth for 30 seconds then spit out. use twice a day      clindamycin (CLEOCIN T) 1 % external solution clindamycin phosphate 1 % topical solution   APPLY TOPICALLY TO GROIN AREA TWICE A DAY      clindamycin (CLEOCIN T) 1 % lotion APPLY TOPICALLY AT NIGHT TO INFLAMED AREAS AS NEEDED FOR FLARES      clobetasoL (TEMOVATE) 0.05 % Gel       clobetasoL (TEMOVATE) 0.05 % ointment Apply daily to painful affected areas if needed for flares, then stop 15 g 1    colchicine (MITIGARE) 0.6 mg cap capsule TAKE 2 CAPSULES BY MOUTH ON DAY ONE, FOLLOWED BY ONE CAPSULE ONE HOUR LATER, ON DAY 2 TAKE ONE CAPSULE BY MOUTH TWICEA DAY UNTIL RESOLVED 60 capsule 0    cyclobenzaprine (FLEXERIL) 10 MG tablet Take 1 tablet (10 mg total) by mouth Three (3) times a day as needed for muscle spasms. 60 tablet 1    dextroamphetamine sulfate (DEXTROSTAT) 10 MG tablet Take 2 tablets (20 mg total) by mouth two (2) times a day.      dicyclomine (BENTYL) 10 mg capsule Take 1 capsule (10 mg total) by mouth Three (3) times a day. 270 capsule 1    diphenoxylate-atropine (LOMOTIL) 2.5-0.025 mg per tablet Take 1 tablet by mouth two (2) times a day as needed for diarrhea. 30 tablet 5    dulaglutide (TRULICITY) 1.5 mg/0.5 mL PnIj Inject 0.5 mL (1.5 mg total) under the skin every seven (7) days. 2 mL 5    DULoxetine (CYMBALTA) 60 MG capsule Take 1 capsule (60 mg total) by mouth Two (2) times a day. 180 capsule 0    empty container (SHARPS CONTAINER) Misc Use as directed to dispose of Humira needles 1 each 2    empty container Misc Use as directed to dispose of needles. When full, make sure lid is closed tightly then dispose of container in trash. 1 each 2    erenumab-aooe (AIMOVIG AUTOINJECTOR) 140 mg/mL AtIn Inject 140 mg under the skin every thirty (30) days. 3 mL 1    famotidine (PEPCID) 20 MG tablet Take 1 tablet (20 mg total) by mouth Two (2) times a day. 180 tablet 1    fluoride, sodium, 1.1 % Pste       fluticasone propionate (FLONASE) 50 mcg/actuation nasal spray 1 spray into each nostril Two (2) times a day. 16 g 5    glipiZIDE (GLUCOTROL) 10 MG tablet Take 2 tablets (20 mg total) by mouth Two (2) times a day (30 minutes before a meal). 360 tablet 1    HUMALOG U-100 INSULIN 100 unit/mL injection INJECT 120 UNITS UNDER THE SKIN DAILY VIA OMNIPOD 40 mL 5    hydroCHLOROthiazide (HYDRODIURIL) 25 MG tablet Take 1 tablet (25 mg total) by mouth daily. 90 tablet 3    hydrocortisone (PROCTOSOL HC) 2.5 % rectal cream Insert 1 application into the rectum two (2) times a day as needed. 28.35 g 2    hydrOXYzine (ATARAX) 25 MG tablet Take 1 tablet (25 mg total) by mouth Three (3) times a day as needed. (Patient taking differently: Take 1 tablet (25 mg total) by mouth four (4) times a day as needed.) 120 tablet 1    ibuprofen (ADVIL,MOTRIN) 800 MG tablet Take 1 tablet (800 mg total) by mouth every eight (8)  hours as needed. 90 tablet 1    insulin pump cart,automated,BT (OMNIPOD 5 G6 PODS, GEN 5,) Crtg Change POD every 48 hours. 3 each 11    insulin syringe-needle U-100 1 mL 31 gauge x 5/16 (8 mm) Syrg       ketoconazole (NIZORAL) 2 % cream APPLY TO AFFECTED AREA OF THE GROIN DAILY      lancets (ACCU-CHEK FASTCLIX LANCET DRUM) Misc Use to check blood sugars 3 times a day before meals or as directed 200 each 5    levothyroxine (SYNTHROID) 50 MCG tablet Take 1 tablet (50 mcg total) by mouth daily. 90 tablet 1    lidocaine (XYLOCAINE) 5 % ointment lidocaine 5 % topical ointment      montelukast (SINGULAIR) 10 mg tablet Take 1 tablet (10 mg total) by mouth daily. 90 tablet 3    multivitamin (MULTIPLE VITAMIN ORAL) Take 1 tablet by mouth daily.      nystatin (MYCOSTATIN) 100,000 unit/gram powder APPLY TO FOLDS ON ABDOMEN THREE TIMES DAILY      ondansetron (ZOFRAN) 4 MG tablet take 1-2 tablets by mouth twice a day if needed for nausea      oxybutynin (DITROPAN) 5 MG tablet Take 1 tablet (5 mg total) by mouth Two (2) times a day. 180 tablet 1    pantoprazole (PROTONIX) 40 MG tablet Take 1 tablet (40 mg total) by mouth Two (2) times a day. 180 tablet 1    polyethylene glycol (GLYCOLAX) 17 gram/dose powder       promethazine (PHENERGAN) 25 MG tablet TAKE 1 TO 2 TABLETS BY MOUTH AT BEDTIME AS NEEDED FOR NAUSEA      propranoloL (INDERAL LA) 120 mg 24 hr capsule Take 1 capsule (120 mg total) by mouth daily. 90 capsule 1    ramelteon (ROZEREM) 8 mg tablet Take 1 tablet (8 mg total) by mouth nightly.      sour cherry extract (TART CHERRY EXTRACT) 1,000 mg cap       topiramate (TOPAMAX) 50 MG tablet Take 1.5 tablets (75 mg total) by mouth Two (2) times a day. 270 tablet 1    inhalational spacing device (AEROCHAMBER MV) Spcr 1 each by Miscellaneous route every four (4) hours as needed (with inhaler). 1 each 0    insulin pump cartridge Crtg Inject 200 Units under the skin every other day. Change every 48 hours 15 each 11     No current facility-administered medications for this visit.       Health Maintenance:     Health Maintenance   Topic Date Due    COVID-19 Vaccine (3 - Pfizer risk series) 01/24/2021    Lipid Screening  11/21/2021    Serum Creatinine Monitoring  01/18/2022    Potassium Monitoring  01/18/2022    Influenza Vaccine (1) 02/08/2022    Hemoglobin A1c  04/12/2022    Urine Albumin/Creatinine Ratio  10/11/2022    Retinal Eye Exam  10/31/2022    Foot Exam  01/03/2023    DTaP/Tdap/Td Vaccines (4 - Td or Tdap) 07/17/2023    COPD Spirometry  08/06/2026    Pneumococcal Vaccine 0-64  Completed    Hepatitis C Screen  Completed       Immunizations:     Immunization History   Administered Date(s) Administered    COVID-19 VACC,MRNA,(PFIZER)(PF) 12/04/2020, 12/27/2020    HEPATITIS B VACCINE ADULT,IM(ENERGIX B, RECOMBIVAX) 03/05/2013, 04/05/2013, 09/03/2013    Hepatitis B Vaccine, Unspecified Formulation 12/27/1999, 04/29/2000 INFLUENZA INJ MDCK PF, QUAD,(FLUCELVAX)(32MO AND UP EGG  FREE) 03/31/2019    INFLUENZA TIV (TRI) PF (IM) 10/08/2011    Influenza Vaccine Quad (IIV4 PF) 13mo+ injectable 03/05/2013, 03/18/2014, 03/22/2015, 03/05/2016, 03/19/2017, 02/23/2018    Influenza Virus Vaccine, unspecified formulation 03/19/2017, 02/23/2018, 03/25/2019, 03/10/2020, 04/05/2021    PNEUMOCOCCAL POLYSACCHARIDE 23 12/30/2012    PPD Test 10/28/2016    Pneumococcal Conjugate 20-valent 01/02/2022    TD(TDVAX),ADSORBED,2LF(IM)(PF) 04/29/2000    TdaP 07/18/2008, 07/16/2013     I have reviewed and (if needed) updated the patient's problem list, medications, allergies, past medical and surgical history, social and family history.     Vital Signs:     Wt Readings from Last 3 Encounters:   01/02/22 (!) 222.3 kg (490 lb)   12/10/21 (!) 227.3 kg (501 lb 1.6 oz)   11/28/21 (!) 221.8 kg (489 lb)     Temp Readings from Last 3 Encounters:   01/02/22 37.2 ??C (99 ??F) (Oral)   11/28/21 36.8 ??C (98.3 ??F) (Oral)   11/21/21 36.7 ??C (98 ??F) (Temporal)     BP Readings from Last 3 Encounters:   01/02/22 128/72   12/10/21 126/63   11/28/21 128/82     Pulse Readings from Last 3 Encounters:   01/02/22 81   12/10/21 78   11/28/21 101     Estimated body mass index is 68.34 kg/m?? as calculated from the following:    Height as of this encounter: 180.3 cm (5' 11).    Weight as of this encounter: 222.3 kg (490 lb).  Facility age limit for growth percentiles is 20 years.      Objective:      General: Alert and oriented x3. Well-appearing. No acute distress. Morbidly obese.   HEENT:  Normocephalic.  Atraumatic. Conjunctiva and sclera normal. Audible nasal congestion. OP MMM without lesions.   Neck:  Supple. No thyroid enlargement. No adenopathy.   Heart:  Regular rate and rhythm. Normal S1, S2. No murmurs, rubs or gallops.   Lungs:  No respiratory distress.  Lungs clear to auscultation. No wheezes, rhonchi, or rales.   GI/GU:  Soft, +BS, nondistended, non-TTP. No palpable masses or organomegaly.   Extremities:  Lymphedema bilateral LE. Peripheral pulses normal.   Skin:  Warm, dry. No rash or lesions present.   Neuro:  Non-focal. No obvious weakness.   Psych:  Affect normal, eye contact good, speech clear and coherent.     [name], FNP

## 2022-01-03 ENCOUNTER — Ambulatory Visit (INDEPENDENT_AMBULATORY_CARE_PROVIDER_SITE_OTHER): Payer: 59 | Admitting: Nurse Practitioner

## 2022-01-03 ENCOUNTER — Telehealth: Admit: 2022-01-03 | Discharge: 2022-01-04 | Payer: MEDICARE

## 2022-01-03 DIAGNOSIS — Z794 Long term (current) use of insulin: Principal | ICD-10-CM

## 2022-01-03 DIAGNOSIS — I10 Essential (primary) hypertension: Principal | ICD-10-CM

## 2022-01-03 DIAGNOSIS — E1165 Type 2 diabetes mellitus with hyperglycemia: Principal | ICD-10-CM

## 2022-01-03 DIAGNOSIS — Z6841 Body Mass Index (BMI) 40.0 and over, adult: Principal | ICD-10-CM

## 2022-01-03 DIAGNOSIS — U071 COVID: Principal | ICD-10-CM

## 2022-01-03 DIAGNOSIS — J4520 Mild intermittent asthma, uncomplicated: Principal | ICD-10-CM

## 2022-01-03 MED ORDER — INSULIN SYRINGE U-100 WITH NEEDLE 1 ML 31 GAUGE X 5/16" (8 MM)
0 refills | 0 days
Start: 2022-01-03 — End: ?

## 2022-01-03 MED ORDER — NIRMATRELVIR 300 MG (150 MG X2)-RITONAVIR 100 MG TABLET,DOSE PACK
ORAL_TABLET | 0 refills | 0 days | Status: CP
Start: 2022-01-03 — End: ?

## 2022-01-03 NOTE — Unmapped (Incomplete)
Patient is requesting the following refill  Requested Prescriptions     Pending Prescriptions Disp Refills   ??? insulin syringe-needle U-100 1 mL 31 gauge x 5/16 (8 mm) Syrg  0       Recent Visits  Date Type Provider Dept   01/02/22 Office Visit [name] Rosita Fire, FNP [facility] [address] [facility]   11/28/21 Office Visit [name] Rosita Fire, FNP [place] [facility] [address] [facility]   10/10/21 Office Visit [name] Rosita Fire, FNP [place] [facility] [address] [facility]   09/11/21 Office Visit [name] Rosita Fire, FNP [place] [facility] [address] [facility]   07/13/21 Office Visit [name] Rosita Fire, FNP [place] [facility]   04/12/21 Office Visit [name] Rosita Fire, FNP [place] [facility]   02/05/21 Office Visit [name] Rosita Fire, FNP [place] [facility]   01/09/21 Office Visit [name] Rosita Fire, FNP [facility] [facility]   Showing recent visits within past 365 days with a meds authorizing provider and meeting all other requirements  Future Appointments  Date Type Provider Dept   04/10/22 Appointment [name] Rosita Fire, FNP  [facility] [address] [facility]   Showing future appointments within next 365 days with a meds authorizing provider and meeting all other requirements       Labs: {[email or web address]:[id number]}

## 2022-01-04 MED ORDER — INSULIN SYRINGE U-100 WITH NEEDLE 1 ML 31 GAUGE X 5/16" (8 MM)
0 refills | 0 days | Status: CP
Start: 2022-01-04 — End: ?

## 2022-01-04 NOTE — Unmapped (Signed)
[employer] [facility] Encounter  This medical encounter was conducted virtually using [email or web address]  TeleHealth protocols.    Patient ID: Logan Quinn is a 41 y.o. male who presents by video interaction for evaluation.    I have identified myself to the patient and conveyed my credentials to Erline Levine.   Patient has signed informed consent on file in medical record.    Present on Video Call: Is there someone else in the room? No..    Assessment/Plan:      Diagnoses and all orders for this visit:    COVID  -     nirmatrelvir-ritonavir (PAXLOVID, EUA,) 300 mg (150 mg x 2)-100 mg tablet; See package instructions.    Type 2 diabetes mellitus with hyperglycemia, with long-term current use of insulin (CMS-HCC)  -     nirmatrelvir-ritonavir (PAXLOVID, EUA,) 300 mg (150 mg x 2)-100 mg tablet; See package instructions.    Mild intermittent asthma without complication  -     nirmatrelvir-ritonavir (PAXLOVID, EUA,) 300 mg (150 mg x 2)-100 mg tablet; See package instructions.    Primary hypertension  -     nirmatrelvir-ritonavir (PAXLOVID, EUA,) 300 mg (150 mg x 2)-100 mg tablet; See package instructions.    Class 3 severe obesity due to excess calories with serious comorbidity and body mass index (BMI) of 60.0 to 69.9 in adult (CMS-HCC)       As above. Call back if worsening  -- Discussed the new prescription noted above, including potential side effects, drug interactions, instructions for taking the medication, and the consequences of not taking it.  -- Patient verbalized an understanding of today's assessment and recommendations, as well as the purpose of ongoing medications.    Follow-up with [facility] PCP        Medication adherence and barriers to the treatment plan have been addressed. Opportunities to optimize healthy behaviors have been discussed. Patient / caregiver voiced understanding.         HPI:  Logan Quinn is 41 y.o. and presents today in the [facility] with COVID-19 for evaluation for Paxlovid prescription.    The PCP for this patient is [name], FNP.   COVID-19 symptoms onset was stuffy nose wife and baby positive. And now he and son are positive.  Maybe 3 days ago but very mild. No other symptoms.Had covid nov. 2021. Was much sicker then.    ADULT PAXLOVID/MAB INFUSION CRITERIA    Patient meets one of more of the following:    Meets one of the criteria for risk of severe disease progression         Those diagnosed with COVID-19 , Those over 12 years and weight > 40 kg who meet one of the following disposition criteria:  outpatient or inpatient for reasons other than COVID-19, 1 or more mild to moderate COVID 19 symptom ( eg , fever, cough, sore throat, muscle pain, etc.), Meets therapeutic AGENT-SPECIFIC criteria including days from symptom onset, age, pregnancy status, concomitant medications, etc, and ORAL ANTIVIRAL CRITERIA:  MEETS EUA CRITERIA of high risk for disease progression BMI>/=25 kg/m2  Diabetes Type 1 or 2  Heart Conditions  Immunocompromised condition or weakened immune system      Exclusion Criteria: None  New or increased supplemental oxygen requirements due to COVID-19   On mechanical ventilation or anticipated impending need for mechanical ventilation    Medication reconciliation process was completed.     The patient is on  one or more of the following medications. Xanax cut in half    The patient is not able to stop the medication during their full paxlovid treatment course.     The patient was counseled on the dangers of these medications if mixed with Paxlovid.     Contraindicated - Specific Medications  Listed alphabetically by generic name   Abemaciclib  Afluzosin  Amiodarone  Amlodipine  Apalutamide*  Apixaban  Atorvastatin  Bedaquiline  Bosentan*  Carbamazepine*  Ceritinib  Clarithromycin  Clozapine  Colchicine  Cyclosporine  Dasatinib*  Digoxin  Diltiazem  Dronedarone  Encorafenib* Fentanyl Felodipine  Flecainide  Ibrutinib*  Ivosidenib*  Ketoconazole (systemic)  Lovastatin  Lurasidone  Methadone  Midazolam (oral)  Neratinib*  NIcardipine   Nifedipine   Nilotinib*  Pethidine  Phenobarbital*  Phenytoin*  Pimozide  Piroxicam  Propafenone Propoxyphene  Quetiapine  Quinidine  Ranolazine  Rifabutin  Rifampin*  Rivaroxaban  Rosuvastatin  Sildenafil (Revatio??) - only when prescribed at doses for Pulmonary Arterial Hypertension  Simvastatin  Sirolimus  St. John's Wort (hypericum perforatum)*  Tacrolimus  Triazolam  Venetoclax*  Vinblastine*  Vincristine*  Voriconazole  Warfarin   Contraindicated - Medication Classes   Ergot Derivatives     Patient is on Oral, Inhaled/Intransal, and Topicalsteroids. Patient was advised to stop.    Patient drug and drug component allergies reviewed. Patient does NOT have any Paxlovid allergies.    Patient does NOT need renal dosing (GFR>30, <60) for Paxlovid.     Patient does qualify for Paxlovid based on my medication review.      Paxlovid prescription was sent to the pharmacy.     Reviewed side effects with patient including:(incidence ?1% and ?5 difference compared to placebo): dysgeusia, diarrhea, hypertension, and myalgia.            ROS  Review of Systems     All other ROS per HPI.    I have reviewed the problem list, past medical history, past family history, medications, and allergies and have updated/reconciled them if needed.            Objective:   Physical Exam  As part of this Video Visit, no in-person exam was conducted.  Video interaction permitted the following observations.    General: No acute distress.   RESP: Relaxed respiratory effort. No conversational dyspnea.   SKIN: No rashes noted.     NEURO: Normal coordination.  No tremors observed.  PSYCH: Alert and oriented.  Speech fluent and sensible.  Calm affect.            The patient reports they are currently: at home. I spent 15 minutes on the real-time audio and video with the patient on the date of service. I spent an additional 2 minutes on pre- and post-visit activities on the date of service.     The patient was physically located in [place] or a state in which I am permitted to provide care. The patient and/or parent/guardian understood that s/he may incur co-pays and cost sharing, and agreed to the telemedicine visit. The visit was reasonable and appropriate under the circumstances given the patient's presentation at the time.    The patient and/or parent/guardian has been advised of the potential risks and limitations of this mode of treatment (including, but not limited to, the absence of in-person examination) and has agreed to be treated using telemedicine. The patient's/patient's family's questions regarding telemedicine have been answered.     If the visit was completed in an  ambulatory setting, the patient and/or parent/guardian has also been advised to contact their provider???s office for worsening conditions, and seek emergency medical treatment and/or call 911 if the patient deems either necessary.

## 2022-01-05 MED ORDER — PROMETHAZINE 25 MG TABLET
0 refills | 0 days
Start: 2022-01-05 — End: ?

## 2022-01-05 MED ORDER — ONDANSETRON HCL 4 MG TABLET
0 refills | 0 days
Start: 2022-01-05 — End: ?

## 2022-01-05 MED ORDER — ALBUTEROL SULFATE HFA 90 MCG/ACTUATION AEROSOL INHALER
Freq: Four times a day (QID) | RESPIRATORY_TRACT | 5 refills | 0 days | PRN
Start: 2022-01-05 — End: 2023-01-05

## 2022-01-07 NOTE — Unmapped (Signed)
Patient is requesting the following refill  Requested Prescriptions     Pending Prescriptions Disp Refills    albuterol HFA 90 mcg/actuation inhaler 8 g 5     Sig: Inhale 2 puffs every six (6) hours as needed for wheezing.    ondansetron (ZOFRAN) 4 MG tablet  0    promethazine (PHENERGAN) 25 MG tablet  0       Recent Visits  Date Type Provider Dept   01/02/22 Office Visit [name] Rosita Fire, FNP [facility] [address] [facility]   11/28/21 Office Visit [name] Rosita Fire, FNP [facility] [address] [facility]   10/10/21 Office Visit [name] Rosita Fire, FNP [facility] [address] [facility]   09/11/21 Office Visit [name] Rosita Fire, FNP [facility] [address] [facility]   07/13/21 Office Visit [name] Rosita Fire, FNP [facility]   04/12/21 Office Visit [name] Rosita Fire, FNP [facility]   02/05/21 Office Visit [name] Rosita Fire, FNP [facility]   01/09/21 Office Visit [name] Rosita Fire, FNP [facility]   Showing recent visits within past 365 days with a meds authorizing provider and meeting all other requirements  Future Appointments  Date Type Provider Dept   04/10/22 Appointment [name] Rosita Fire, FNP  [facility] [address] [facility]   Showing future appointments within next 365 days with a meds authorizing provider and meeting all other requirements

## 2022-01-11 MED ORDER — ONDANSETRON HCL 4 MG TABLET
ORAL_TABLET | Freq: Three times a day (TID) | ORAL | 1 refills | 10 days | Status: CP | PRN
Start: 2022-01-11 — End: ?

## 2022-01-11 MED ORDER — ALBUTEROL SULFATE HFA 90 MCG/ACTUATION AEROSOL INHALER
Freq: Four times a day (QID) | RESPIRATORY_TRACT | 5 refills | 0 days | Status: CP | PRN
Start: 2022-01-11 — End: 2023-01-11

## 2022-01-11 MED ORDER — PROMETHAZINE 25 MG TABLET
ORAL_TABLET | Freq: Three times a day (TID) | ORAL | 1 refills | 10 days | Status: CP | PRN
Start: 2022-01-11 — End: ?

## 2022-01-13 DIAGNOSIS — E1165 Type 2 diabetes mellitus with hyperglycemia: Principal | ICD-10-CM

## 2022-01-13 DIAGNOSIS — Z6841 Body Mass Index (BMI) 40.0 and over, adult: Principal | ICD-10-CM

## 2022-01-13 DIAGNOSIS — Z794 Long term (current) use of insulin: Principal | ICD-10-CM

## 2022-01-13 MED ORDER — DULAGLUTIDE 3 MG/0.5 ML SUBCUTANEOUS PEN INJECTOR
SUBCUTANEOUS | 2 refills | 28 days | Status: CP
Start: 2022-01-13 — End: ?

## 2022-01-14 NOTE — Unmapped (Signed)
Pt called and stated that he received call about scheduling sleep study.  Pt wants to know if this is a different sleep study.    Returned call.  Pt stated that he got a message from sleep lab about a referral placed by provider.  Confirmed that there were no new referrals placed after 12/10/21 appointment.

## 2022-01-15 ENCOUNTER — Ambulatory Visit
Admit: 2022-01-15 | Discharge: 2022-01-16 | Payer: MEDICARE | Attending: Physician Assistant | Primary: Physician Assistant

## 2022-01-15 ENCOUNTER — Ambulatory Visit: Admit: 2022-01-15 | Discharge: 2022-01-16 | Payer: MEDICARE

## 2022-01-15 DIAGNOSIS — R7989 Other specified abnormal findings of blood chemistry: Principal | ICD-10-CM

## 2022-01-15 DIAGNOSIS — Z0389 Encounter for observation for other suspected diseases and conditions ruled out: Principal | ICD-10-CM

## 2022-01-15 DIAGNOSIS — Z6841 Body Mass Index (BMI) 40.0 and over, adult: Principal | ICD-10-CM

## 2022-01-15 DIAGNOSIS — E1165 Type 2 diabetes mellitus with hyperglycemia: Principal | ICD-10-CM

## 2022-01-15 DIAGNOSIS — E039 Hypothyroidism, unspecified: Principal | ICD-10-CM

## 2022-01-15 DIAGNOSIS — Z794 Long term (current) use of insulin: Principal | ICD-10-CM

## 2022-01-15 LAB — COMPREHENSIVE METABOLIC PANEL
ALBUMIN: 3.2 g/dL — ABNORMAL LOW (ref 3.4–5.0)
ALKALINE PHOSPHATASE: 69 U/L (ref 46–116)
ALT (SGPT): 62 U/L — ABNORMAL HIGH (ref 7–40)
ANION GAP: 7 mmol/L (ref 3–11)
AST (SGOT): 29 U/L (ref 13–40)
BILIRUBIN TOTAL: 1.2 mg/dL (ref 0.3–1.2)
BLOOD UREA NITROGEN: 9 mg/dL (ref 9–23)
BUN / CREAT RATIO: 10
CALCIUM: 8.6 mg/dL — ABNORMAL LOW (ref 8.7–10.4)
CHLORIDE: 103 mmol/L (ref 98–107)
CO2: 29.9 mmol/L (ref 20.0–31.0)
CREATININE: 0.92 mg/dL (ref 0.60–1.10)
EGFR CKD-EPI (2021) MALE: >90 mL/min/{1.73_m2} (ref >=60)
GLUCOSE RANDOM: 145 mg/dL — ABNORMAL HIGH (ref 70–99)
POTASSIUM: 3.6 mmol/L (ref 3.5–5.1)
PROTEIN TOTAL: 6.8 g/dL (ref 5.7–8.2)
SODIUM: 140 mmol/L (ref 136–145)

## 2022-01-15 LAB — CBC
HEMATOCRIT: 42.6 % (ref 39.0–48.0)
HEMOGLOBIN: 14.3 g/dL (ref 12.9–16.5)
MEAN CORPUSCULAR HEMOGLOBIN CONC: 33.6 g/dL (ref 32.0–36.0)
MEAN CORPUSCULAR HEMOGLOBIN: 31.9 pg (ref 25.9–32.4)
MEAN CORPUSCULAR VOLUME: 95 fL (ref 77.6–95.7)
MEAN PLATELET VOLUME: 9.9 fL (ref 6.8–10.7)
PLATELET COUNT: 187 10*9/L (ref 150–450)
RED BLOOD CELL COUNT: 4.49 10*12/L (ref 4.26–5.60)
RED CELL DISTRIBUTION WIDTH: 14.5 % (ref 12.2–15.2)
WBC ADJUSTED: 7.3 10*9/L (ref 3.6–11.2)

## 2022-01-15 LAB — TOXICOLOGY SCREEN, URINE
AMPHETAMINE SCREEN URINE: NEGATIVE
BARBITURATE SCREEN URINE: NEGATIVE
BENZODIAZEPINE SCREEN, URINE: NEGATIVE
BUPRENORPHINE, URINE SCREEN: NEGATIVE
CANNABINOID SCREEN URINE: NEGATIVE
COCAINE(METAB.)SCREEN, URINE: NEGATIVE
FENTANYL SCREEN, URINE: NEGATIVE
METHADONE SCREEN, URINE: NEGATIVE
OPIATE SCREEN URINE: NEGATIVE
OXYCODONE SCREEN URINE: NEGATIVE
PCP SCREEN, URINE: NEGATIVE

## 2022-01-15 LAB — FERRITIN: FERRITIN: 125 ng/mL (ref 10.5–307.3)

## 2022-01-15 LAB — LIPID PANEL
CHOLESTEROL/HDL RATIO SCREEN: 3.6 (ref 1.0–4.5)
CHOLESTEROL: 191 mg/dL (ref <=200)
HDL CHOLESTEROL: 53 mg/dL (ref 40–60)
LDL CHOLESTEROL CALCULATED: 117 mg/dL — ABNORMAL HIGH (ref 40–100)
NON-HDL CHOLESTEROL: 138 mg/dL — ABNORMAL HIGH (ref 70–130)
TRIGLYCERIDES: 104 mg/dL (ref 0–150)
VLDL CHOLESTEROL CAL: 20.8 mg/dL (ref 11–50)

## 2022-01-15 LAB — VITAMIN B12: VITAMIN B-12: 347 pg/mL (ref 211–911)

## 2022-01-15 LAB — FOLATE: FOLATE: 18.2 ng/mL

## 2022-01-15 LAB — TSH: THYROID STIMULATING HORMONE: 2.69 u[IU]/mL (ref 0.550–4.780)

## 2022-01-15 LAB — IRON & TIBC
IRON SATURATION: 23 %
IRON: 63 ug/dL — ABNORMAL LOW (ref 65–175)
TOTAL IRON BINDING CAPACITY: 277 ug/dL (ref 250–425)

## 2022-01-15 NOTE — Unmapped (Signed)
Nutrition:  1. Focus on consuming lean protein, a variety of non starchy vegetables and fruit with each meal and snacks. I recommend 1/2 of the plate non starchy vegetables, 1/4 of the plate protein and 1/4 of the plate carbohydrates. Refer to the Mediterranean diet as a guide to healthy eating.   2. Have a consistent meal pattern with three meals per day and two snacks. Avoid skipping meals.  Avoid skipping meals or grazing. Avoid going longer than 4 hours without eating. Schedule meals and snacks every 3 hours.   3. Avoid consuming processed and fast foods; and avoid refined carbohydrates ( such as white bread, biscuits, white pasta, white rice, grits, crackers).   4. Start to wean off carbonated and sugar sweetened beverages.   5. Use a Premier Protein, Ensure Max, Boost Max, or Fair Life Core shakes as a meal replacement to help you to avoid skipping meals. Or choose a protein shake with less than 200 calories, at least 20 grams of protein, less than 10 grams of total carbohydrates and 5 or less of total fat.       Exercise:   1. Start walking, biking, aerobic exercise videos or chair exercises for upper and lower body using videos on YouTube.com at least once a week to get started on an exercise routine. [employer] chair exercises has a few videos on YouTube.com that you may find interesting. Lesile Sansone Walk at Home 15: 1 mile video may be found on YouTube.com as well.     2. Schedule exercise at least 5-10 minutes once a week then gradually add another day every 2-3 weeks as tolerated. Next try to increase exercise duration by a few minutes each week until you get to 30 minutes most days of the week.     3. Finally, Add resistance training by alternating between upper and lower body resistance exercises using bands or free weights 2-3 times a week.     4. Additional resources  [employer]   SWEAT    Beachbody    Blogilates    Popsugar Fitness (YT)    iFit app     Elderly   HAS[employer] Tribe Chair Exercises YouTube   SilverSneakers YouTube & Online   GreatestFeeling.no   https://www.[place].org/aging-resource-center/use-it-or-lose-it    In-person   [employer] 30 minute sessions   [employer] - Rx may be covered by [employer]          Sleep: Healthy sleep habits include ...  1. Going to bed when you are sleepy, trying to get between 7-8 hours per night,  going to bed around the same time nightly and waking around the same time each morning.   2. Avoiding screen time at least one hour before bed.   3. Avoid napping during the day.  4. Wearing your CPAP/BIPAP nightly.   5. Avoid drinking at least 2 hours before bed. If your mouth is dry, try small sips to moisten your mouth.   6. Try Yoga Nidra on YouTube.com (the lady in the blue shirt) to help with improving sleep, relaxation, releasing tension and stress.       Stress management:   1. Meditation is a great way to relieve stress and anxiety. Apps for your phone or tablet such as Headspace, Calm and Mindshift will provide guided meditation and help with stress management. Try meditation daily.   2. Yoga Nidra on YouTube.com (the lady in the blue  shirt) may help with improving sleep, relaxation, releasing tension and stress as well.      Additional Recommendations:    If diagnosed with moderate or severe obstructive sleep apnea, we strongly recommend that you start treatment for (moderate or severe) obstructive sleep apnea for a minimum of 3 months before we operate. If you choose not to wear CPAP, or are intolerant to it, you will need an echocardiogram to check for any sign of pulmonary hypertension, and be willing to accept the increased risks of untreated sleep apnea.   You will need an hemogloblin A1C level less than 8.5.   We require all candidates for bariatric surgery to be smoke free and drug free. You must have a negative nicotine screening and a negative drug screen.

## 2022-01-15 NOTE — Unmapped (Signed)
Assessment     Logan Quinn is a 41 y.o. male and Body mass index is 69.81 kg/m??..  He presents with obesity contributing to multiple co morbidities and affecting her ability to be active.  His target weight is 150 lbs .  His weight gain history is suggestive of an early onset obesity and progressive weight gain. Additional causes of his obesity include metabolic effects of consumption of processed food, irregular eating patterns, circadian disruption, and suboptimal physical activity  Co-morbidities at this time include Type II Diabetes Mellitus, GERD, Depression, Anxiety, OSA, Hidradenitis Suppurativa, Hypertension, Nonalcoholic Fatty Liver Disease, Asthma, Hypothyroidism, IBS, Chronic pansinusitis, Chronic joint pain, Migraines, Venous Insufficiency, Neuropathy in diabetes, Gout.    Plan    1. Based on my assessment of the possible etiologies for the patient's obesity, medical co-morbidities, and review of systems my recommendations include the following:     Diet quality:   Diet is inadequate in Quinn protein, inadequate in vegetables, moderate in processed foods and low in SSB.     Patient was encouraged to focus on consuming Quinn protein, a variety of non starchy vegetables and fruit with each meal and snacks. Wean off carbonated and sugar sweetened beverages. Avoid consuming processed and fast foods; and avoid refined carbohydrates ( such as white bread, biscuits, white pasta, white rice, grits, crackers).      Dietary patterns and associated behaviors:  Meal pattern is inconsistent. Patient tends to skip meals.     We discussed the importance of having a  consistent meal pattern with three meals per day and 1-3 snacks. Avoid skipping meals or grazing. Avoid going longer than 4 hours without eating.     Patient asked to schedule meals and snacks every 3 hours.     Recommended Premier Protein, Ensure Max, Boost Max and Fair Life Core shakes as a meal replacement to avoid skipping meals. Or choose a protein shake with less than 200 calories, at least 20 grams of protein, less than 10 grams of total carbohydrates and 5 or less of total fat.             Exercise: PA not adequate for age/risk factors      We discussed scheduling exercise at least 5-10 minutes once a week then gradually add another day every 2-3 weeks as tolerated. Next try to increase exercise duration by a few minutes each week until you get to 30 minutes most days of the week.     Then add resistance training by alternating between upper and lower body resistance exercises using bands or free weights 2-3 times a week.     Recommended walking, [employer] chair exercises on [email or web address] or Kellie Shropshire walk at home videos to get started on a routine.            Sleep: Patient has a h/o OSA and is compliant with CPAP therapy. Good sleep hygiene.      Encouraged healthy sleep habits/sleep hygiene such as going to bed when sleepy, trying to get between 7-8 hours per night,  going to bed around the same time nightly and waking around the same time each morning.   Avoid screen time at least one hour before bed.  Avoid napping during the day.   Wear CPAP/BIPAP nightly.   Avoid drinking at least 2 hours before bed. If your mouth is dry, try small sips to moisten your mouth.   Try Yoga Nidra on YouTube.com (the lady in the blue shirt) to  help with improving sleep, relaxation, releasing tension and stress.            Stress management: Stress level is currently moderate, feels that it is manageable, and no worrisome sx such as SI/HI, self harm, or worsening of previous psych diagnosis           Weight gain causing medications:Gabapentin and Duloxetine.          Anti Obesity Medications: A few anti-obesity medications may be used for this patient including metformin, topiramate, wellbutrin, GLP1 agonists or SGLT2 inhibitors pending insurance coverage.        Obesity Surgery: Patient is interested in surgery and meets criteria for bariatric surgery. Patient requires a supervised diet and will refer to the bariatric intake clinic after completion of supervised diet visit.  He will need to lose weight down to 450 lbs before bariatric intake.     ?   Labwork ordered: CBC, CMP, HgbA1C, TSH, Vitamin B12, Vitamin D, Folate, Ferritin, Iron Panel, Lipid panel, Tox screen and Nicotine screen.     ______________________________________________________________________________________________________________________________    Subjective    Logan Quinn was seen in consultation at the request of Loran Senters, * for   comprehensive evaluation for the management of worsening obesity      CC/HPI:   Logan Quinn is a 41 y.o. male and Body mass index is 69.81 kg/m??..    Motivations and goals  The following information was extracted from a questionnaire that was administered to the patient prior to setting up appointment.      12/04/2021     4:13 PM   Intake Questions   What is your main reason for obesity treatment? I feel that weight loss will allow me to be more active   What treatments are you interested in pursuing? I am open to any option that will work   Overall goal - A.) I would like to get down to _____ lbs. B.) I do not have a weight goal. I mainly want to feel better 150      Provider comments:    Weight History:      12/04/2021     4:13 PM   Weight History   What was your highest adult weight? (lbs) 509   Please describe when and how you started gaining weight in high school   Have you had any of the following? Felling excessively hungry as a child      Provider comments:  Early onset of weight     Diets tried  Slim Fast shakes, snack bars: 0-40 lbs and maintained not long  Cabbage Soup diet: 20 lbs and maintained not long  Low calorie with pediatrician unsure if weight loss  Keto Unsure if weight loss  Recently cut out soda, juice, milk. Just drinking water 22 lbs.     Dieting History:      12/04/2021     4:13 PM   Diet History   Are you currently working with a Registered Dietitian? No   Breakfast hotdog no bread, water   Afternoon Snack broccoli   Dinner hamburger mac and water   Do you frequently feel hungry within 2 hours of having a regular size meal? No   Do you eat at times when you are not hungry? No   Do you eat for comfort when you are stressed or emotional? No   Are there times when you eat and it feels like you can't stop yourself from  eating? Yes   How many times a week? rare   Do you try to manage your weight by vomiting, using laxatives, diuretics, or excessive exercise? No   Do you sometimes find food on your bed which you do not remember eating? No   Please list foods that you eat frequently chicken, prok chops, salad   Do you exercise regularly? No      Provider comments:  Typically, has three meals per day some days. Sometimes skips breakfast and lunch.  Beverages: water mostly  Alcohol: denies  Eating out: 2-3 times a month.     Denies loss of control. Sometimes he gets so hungry and something did not satisfy hunger. States stops eating  before full.     Physical Activity:      12/04/2021     4:13 PM   Physical Activity History   Do you exercise regularly? No   If you do not exercise, it is because of? my legs and body swell up   Which physical activity do you enjoy? swimming   Are there certain actions or activities that you cannot do because of your weight? tieing shoes      Provider comments:     Sleep:      12/04/2021     4:13 PM   Sleep History   How many hours do you sleep at night on average? 4   What time do you fall asleep? 10:00 PM   What time do you wake up without needing to go back to sleep?  8:00 AM   How many times do you wake up per night? 1   What time do you get the last drink of the day?  9:30 PM   Have you been diagnosed with Obstructive Sleep Apnea (OSA)? Yes   Do you currently use a CPAP or other device for OSA? Yes   STOP-BANG Score Incomplete      Provider comments:    Using CPAP nightly. He feels rested in the morning.     Stress:      12/04/2021     4:13 PM   Stress History   On a scale of 1 to 10, how stressed were you in the past year? 5   What do you do to manage your stress? coping skills      Provider comments: He is seeing a psychiatrist.  Present strategies to cope with stress: listening to music, coloring, crafting, going to church, Designer, jewellery for cub scout pack  Effectiveness of these strategies: good    Previous use of anti-obesity medications:      12/04/2021     4:13 PM   Weight Loss Medication History   Have you ever used any of the medications from this list? Metformin    Topiramate    Trulicity (dulaglutide)   Enter the amount of weight loss and side-effects for any medications selected in the previous question 10-15      Provider comments:    Mental Health History(emotional health):  Any thoughts about harming yourself  or engagement in any self harming behaviors e.g. cutting yoursel: no  Have you been to the ER or hospitalized for mental health reasons :yes, suicide attempt 13-15 years ago.  Any alcohol or substance abuse, including prescription abuse : no  Any suicidal attempts in the past: yes, 13-15 years ago.  Any recreational drug use in the past 12 months:no  Any CBD products: no  Any passive exposures to tobacco or marijuana  smoke:no      Logan Quinn has Diabetes type 2, uncontrolled; Lesion of radial nerve; Obstructive sleep apnea; GERD (gastroesophageal reflux disease) (RAF-HCC); Depression (RAF-HCC); Suicidal ideation; Abdominal pain; Headache; NAFLD (nonalcoholic fatty liver disease); Bleeding external hemorrhoids; Anxiety; Diarrhea; Obesity; Type 2 diabetes mellitus with hyperglycemia (CMS-HCC); Left shoulder pain; Diabetes mellitus (CMS-HCC); Trapezius muscle strain; Primary insomnia; Chronic joint pain; Cellulitis of right lower extremity; No diabetic retinopathy in both eyes; Congenital hypertrophy of retinal pigment epithelium; H/O retinal detachment; IBS (irritable bowel syndrome); Urinary incontinence, nocturnal enuresis; Severe obstructive sleep apnea; OSA on CPAP; Hidradenitis suppurativa; Acute non-recurrent pansinusitis; Acquired hypothyroidism; Essential hypertension; Mild intermittent asthma without complication; Chronic pansinusitis; Environmental and seasonal allergies; Erectile dysfunction; Sinus congestion; Nasal congestion; and Cough variant asthma on their problem list.   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     Patients previous medical records from [facility] reviewed and summarized in the Obesity Medicine Evaluation Summary      has a current medication list which includes the following prescription(s): acetaminophen, alprazolam, ascorbic acid, accu-chek guide test strips, budesonide-formoterol, cetirizine, clindamycin, clobetasol, clobetasol, colchicine, cyclobenzaprine, dextroamphetamine sulfate, dicyclomine, diphenoxylate-atropine, dulaglutide, duloxetine, empty container, aimovig autoinjector, famotidine, fluoride (sodium), glipizide, humalog u-100 insulin, hydrochlorothiazide, hydroxyzine, insulin glargine, omnipod 5 g6 pods (gen 5), insulin syringe-needle u-100, lancets, lidocaine, multivitamin, ondansetron, oxybutynin, pantoprazole, polyethylene glycol, promethazine, propranolol, ramelteon, tart cherry extract, topiramate, humira(cf) pen, albuterol, albuterol, blood-glucose meter, chlorhexidine, clindamycin, fluticasone propionate, hydrocortisone, ibuprofen, ketoconazole, levothyroxine, montelukast, nirmatrelvir-ritonavir, and nystatin.     is allergic to erythromycin, penicillins, sulfa (sulfonamide antibiotics), and other.     Medications:  Current medications with potential to cause weight gain:yes, gabapentin, duloxetine    Relevant symptom review:  Glaucoma: no  Palpitations: no  Chest Pain: no  Nephrolithiasis: no  Seizures: no  H/o pancreatitis: no  Personal or family history of medullary cancer of thyroid:no    Musculoskeletal pain yes, knees, hips, shoulders  Urinary incontinence yes, due to overactive bladder  Heartburn yes, controlled.     Other: n/a    Headaches: yes, h/o migraines controlled with medication  Intermittent Whoosing sound in the earno  Blurred vision or any loss of visionno  Irregular menstrual cycles: N/A male patient  Excessive facial hair:N/A male patient  Active birth control strategy: n/a    All other systems reviewed were negative      Effect on quality of life:  Activities limited by the weight: dressing, playing with children, chores, running, prolonged walking, climbing stairs, bending, stooping or squatting    family history includes Angina in his maternal grandmother and mother; Arthritis in his mother and paternal grandmother; Asthma in his brother, brother, and sister; COPD in his mother and paternal grandmother; Cancer in his maternal grandfather and paternal grandfather; Cataracts in his mother; Depression in his maternal grandmother, mother, paternal grandmother, and sister; Diabetes in his brother, maternal grandmother, maternal uncle, maternal uncle, mother, and sister; GER disease in his mother; Glaucoma in his mother; Hearing loss in his maternal grandfather, maternal uncle, mother, and sister; Heart disease in his maternal grandmother and mother; Hypertension in his maternal grandmother and mother; Kidney disease in his maternal uncle; Liver disease in his maternal uncle; Migraines in his maternal grandmother, mother, and sister; No Known Problems in his  father, paternal aunt, paternal uncle, and another family member; Stroke in his maternal grandmother.      reports that he has never smoked. He has never been exposed to tobacco smoke. He has never used smokeless tobacco. He reports that he does not drink alcohol and does not use drugs.     Objective    Vital Signs  BP 140/81 (BP Site: R Arm, BP Position: Sitting, BP Cuff Size: Large)  - Pulse 72  - Temp 36.7 ??C (98.1 ??F) (Oral)  - Resp 22  - Ht 178 cm (5' 10.08)  - Wt (!) 221.2 kg (487 lb 9.6 oz)  - SpO2 100%  - BMI 69.81 kg/m??    BMI:  (!) 69.81    PE:  General exam:Patient appears calm and alert and oriented.  Body fat distribution Central adiposity No supraclavicular adiposity.  Dorsal adiposity.    Thyroid exam : NAD  Lymphatics: NAD  Skin exam: No acanthosis, no lipomas.  CVS: RRR nl. s1s2, Peripheral edema 1+  Respiratory: CTAB    Gastrointestinal  Abdomen soft. NT/ND.Large pannus. Intertrigo no    Musculoskeletal: Patient ambulatory without help    Results:   Lab work within last 6 months was unavailable and was ordered today        Visit duration  I personally spent 78 minutes face-to-face and non-face-to-face in the care of this patient, which includes all pre, intra, and post visit time on the date of service.    Note - This record has been created using [employer]. Chart creation errors have been sought, but may not always have been located. Such creation errors do not reflect on the standard of medical care.

## 2022-01-16 DIAGNOSIS — Z6841 Body Mass Index (BMI) 40.0 and over, adult: Principal | ICD-10-CM

## 2022-01-16 DIAGNOSIS — E1165 Type 2 diabetes mellitus with hyperglycemia: Principal | ICD-10-CM

## 2022-01-16 DIAGNOSIS — Z794 Long term (current) use of insulin: Principal | ICD-10-CM

## 2022-01-16 LAB — HEMOGLOBIN A1C
ESTIMATED AVERAGE GLUCOSE: 180 mg/dL
HEMOGLOBIN A1C: 7.9 % — ABNORMAL HIGH (ref 4.8–5.6)

## 2022-01-16 MED ORDER — DULAGLUTIDE 3 MG/0.5 ML SUBCUTANEOUS PEN INJECTOR
SUBCUTANEOUS | 2 refills | 28 days | Status: CP
Start: 2022-01-16 — End: ?

## 2022-01-16 NOTE — Unmapped (Signed)
Patient is requesting the following refill  Requested Prescriptions      No prescriptions requested or ordered in this encounter       Recent Visits  Date Type Provider Dept   01/02/22 Office Visit [name] Rosita Fire, FNP [place] [facility] [address] [facility]   11/28/21 Office Visit [name] Rosita Fire, FNP [place] [facility] [address] [facility]   10/10/21 Office Visit [name] Rosita Fire, FNP [place] [facility] [address] [facility]   09/11/21 Office Visit [name] Rosita Fire, FNP [place] [facility] [address] At [facility]   07/13/21 Office Visit [name] Rosita Fire, FNP [place] [facility]   04/12/21 Office Visit [name] Rosita Fire, FNP [place] [facility]   02/05/21 Office Visit [name] Rosita Fire, FNP [place] [facility]   Showing recent visits within past 365 days with a meds authorizing provider and meeting all other requirements  Future Appointments  Date Type Provider Dept   04/10/22 Appointment [name] Rosita Fire, FNP [place] [facility] [address] [facility]   Showing future appointments within next 365 days with a meds authorizing provider and meeting all other requirements       Labs: A1c:   Hemoglobin A1C (%)   Date Value   01/15/2022 7.9 (H)   10/10/2021 7.3 (A)   09/15/2014 6.0

## 2022-01-17 ENCOUNTER — Encounter (INDEPENDENT_AMBULATORY_CARE_PROVIDER_SITE_OTHER): Payer: Self-pay | Admitting: Nurse Practitioner

## 2022-01-17 ENCOUNTER — Ambulatory Visit (INDEPENDENT_AMBULATORY_CARE_PROVIDER_SITE_OTHER): Payer: 59 | Admitting: Nurse Practitioner

## 2022-01-17 VITALS — BP 142/74 | HR 71 | Resp 18 | Ht 70.0 in | Wt >= 6400 oz

## 2022-01-17 DIAGNOSIS — I83813 Varicose veins of bilateral lower extremities with pain: Secondary | ICD-10-CM

## 2022-01-18 ENCOUNTER — Ambulatory Visit: Admit: 2022-01-18 | Payer: MEDICARE

## 2022-01-18 LAB — VITAMIN D 25 HYDROXY: VITAMIN D, TOTAL (25OH): 16.7 ng/mL — ABNORMAL LOW (ref 20.0–80.0)

## 2022-01-18 LAB — NICOTINE SCREEN, URINE
ANABASINE, URINE: <2 ng/mL
COTININE, URINE: <5 ng/mL
NICOTINE, URINE: <5 ng/mL
NORNICOTINE, URINE: <2 ng/mL

## 2022-01-18 NOTE — Unmapped (Signed)
[facility] Refill Coordination Note    Specialty Medication(s) to be Shipped:   Inflammatory Disorders: Humira     Other medication(s) to be shipped: No additional medications requested for fill at this time     Logan Quinn, DOB: [birth year]  Phone: [phone] (home)     All above HIPAA information was verified with patient.     Was a Nurse, learning disability used for this call? No    Completed refill call assessment today to schedule patient's medication shipment from the [facility] [phone]).  All relevant notes have been reviewed.     Specialty medication(s) and dose(s) confirmed: Regimen is correct and unchanged.   Changes to medications: Marcas reports no changes at this time.  Changes to insurance: No  New side effects reported not previously addressed with a pharmacist or physician: None reported  Questions for the pharmacist: No    Confirmed patient received a Conservation officer, historic buildings and a Surveyor, mining with first shipment. The patient will receive a drug information handout for each medication shipped and additional FDA Medication Guides as required.       DISEASE/MEDICATION-SPECIFIC INFORMATION        For patients on injectable medications: Patient currently has 1 doses left.  Next injection is scheduled for 01/24/2022.    SPECIALTY MEDICATION ADHERENCE     Medication Adherence    Patient reported X missed doses in the last month: 1  Specialty Medication: Humira-patient skipped 1 week due to having Covid  Patient is on additional specialty medications: No  Any gaps in refill history greater than 2 weeks in the last 3 months: no  Demonstrates understanding of importance of adherence: yes  Informant: patient  Reliability of informant: reliable              Confirmed plan for next specialty medication refill: delivery by pharmacy  Refills needed for supportive medications: not needed        Were doses missed due to medication being on hold? No    Humira CF 80mg /0.74ml Inj: 7 days of medicine on hand     REFERRAL TO PHARMACIST     Referral to the pharmacist: Not needed    [facility]     Shipping address confirmed in Epic.     Delivery Scheduled: Yes, Expected medication delivery date: 01/24/2022.     Medication will be delivered via Same Day Courier to the prescription address in Epic WAM.    Valere Dross   [facility] Specialty Technician

## 2022-01-19 ENCOUNTER — Encounter (INDEPENDENT_AMBULATORY_CARE_PROVIDER_SITE_OTHER): Payer: Self-pay | Admitting: Nurse Practitioner

## 2022-01-19 NOTE — Progress Notes (Signed)
Varicose veins of right  lower extremity with inflammation (454.1  I83.10) Current Plans   Indication: Patient presents with symptomatic varicose veins of the right  lower extremity.   Procedure: Sclerotherapy using hypertonic saline mixed with 1% Lidocaine was performed on the right lower extremity. Compression wraps were placed. The patient tolerated the procedure well. 

## 2022-01-22 MED FILL — HUMIRA(CF) PEN 80 MG/0.8 ML SUBCUTANEOUS KIT: SUBCUTANEOUS | 28 days supply | Qty: 4 | Fill #6

## 2022-01-23 DIAGNOSIS — N529 Male erectile dysfunction, unspecified: Principal | ICD-10-CM

## 2022-01-23 NOTE — Unmapped (Signed)
Logan Quinn said you were going to order testosterone level before his Urology appt which he has scheduled on 02/14/2022.  Had lots of labs drawn by GI surgery including A1C you wanted.  Logan Quinn wants you to order testosterone so results will be done for Urologist.  Order started.  Let him know so he can schedule lab appt.

## 2022-01-31 ENCOUNTER — Ambulatory Visit (INDEPENDENT_AMBULATORY_CARE_PROVIDER_SITE_OTHER): Payer: Medicare Other | Admitting: Nurse Practitioner

## 2022-02-02 MED ORDER — PROMETHAZINE 25 MG TABLET
ORAL_TABLET | Freq: Three times a day (TID) | ORAL | 1 refills | 0 days | PRN
Start: 2022-02-02 — End: ?

## 2022-02-04 MED ORDER — PROMETHAZINE 25 MG TABLET
ORAL_TABLET | Freq: Three times a day (TID) | ORAL | 1 refills | 10 days | Status: CP | PRN
Start: 2022-02-04 — End: 2023-02-04

## 2022-02-04 NOTE — Unmapped (Signed)
Patient is requesting the following refill  Requested Prescriptions     Pending Prescriptions Disp Refills    promethazine (PHENERGAN) 25 MG tablet [Pharmacy Med Name: PROMETHAZINE 25 MG TAB] 30 tablet 1     Sig: Take 1 tablet (25 mg total) by mouth every eight (8) hours as needed for nausea.       Recent Visits  Date Type Provider Dept   01/02/22 Office Visit [name] Rosita Fire, FNP [place] [facility] [address] [facility]   11/28/21 Office Visit [name] Rosita Fire, FNP [place] [facility] [address] [facility]   10/10/21 Office Visit [name] Rosita Fire, FNP [place] [facility] [address] [facility]   09/11/21 Office Visit [name] Rosita Fire, FNP [place] [facility] [address] [facility]   07/13/21 Office Visit [name] Rosita Fire, FNP [place] [facility]   04/12/21 Office Visit [name] Rosita Fire, FNP [place] [facility]   02/05/21 Office Visit [name] Rosita Fire, FNP [place] [facility]   Showing recent visits within past 365 days with a meds authorizing provider and meeting all other requirements  Future Appointments  Date Type Provider Dept   04/10/22 Appointment [name] Rosita Fire, FNP  [facility] [address] At [facility]   Showing future appointments within next 365 days with a meds authorizing provider and meeting all other requirements       Labs: Not applicable this refill

## 2022-02-08 NOTE — Unmapped (Signed)
Will discuss at next appt

## 2022-02-08 NOTE — Unmapped (Signed)
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 First available physical appt 05/2022  Patient would like to discuss his options  Please call to assist

## 2022-02-08 NOTE — Unmapped (Signed)
Scheduled appt for 9/12.

## 2022-02-13 ENCOUNTER — Telehealth: Admit: 2022-02-13 | Discharge: 2022-02-14 | Payer: MEDICARE | Attending: Registered" | Primary: Registered"

## 2022-02-13 NOTE — Unmapped (Signed)
[facility] -- Nutrition Evaluation      Logan Quinn was seen in consultation at the request of Loran Senters, * for   comprehensive evaluation for the management of worsening obesity    CC/HPI:  Logan Quinn is a 41 y.o. male with a BMI of 69.81 kg/m2.  Primary reason for wanting obesity treatment: physician recommendation  Motivation: better quality of life and improved ability to be active and improvement in health conditions  Overall goal:{Blank single:19197::no weight goal, only to feel better,No weight goal , improvement in ***}    Treatment of interest: lifestyle , pharmacologic and surgical options.    Weight History:  Highest adult weight: *** lb   Lowest adult weight on a diet or weight loss program: *** lb  Lowest usual adult weight: *** lb  Patients own description of the chronology of weight gain:    Evidence of strong genetic component: {SM Genetic:58023}    Developmental contributions: {SM Developmental Contributions:58025}    Other contributors to weight gain: {SM Other Contributors:58024}    Dieting History:  What diets worked for you in the past? ***  Maximum weight lost and how long was it maintained? ***  Are you currently working with a RD? no    Current  Lifestyle Patterns  Dietary:  Overview of typical 24 hr recall:   10 am - egg - boiled or scrambled, yogurt   1 pm - yogurt or cashews or skip   3-4 pm - protein with non starchy vegetables - salmon or chicken breast with broccoli or cauliflower, green beans   5 pm - skip or nuts  7-8 pm - same as lunch   9 pm -  peanuts, cashews or almonds     Beverages: water, occassionally or when dining out - sweet tea   Alcohol: none   Food Allergies/Restrictions: none reported     Eating behaviors:  Excessive hunger within 1-2 hrs of having a regular meal: {Yes /No with options:49760::no}  Eating when not hungry: {Yes /No with options:49760::no}  Eating for comfort when stressed or emotional: {Yes /No with options:49760::no}  Times when you are eating and it literally feels like you can't stop: {Yes /No with options:49760::no}  Try to manage your weight by vomiting, using laxatives, diuretics, or excessive exercise? {Yes /No with options:49760::no}  Ever find evidence that you have been eating when you were asleep: {Yes /No with options:49760::no}  Eat late at night or wake up at night and eat: {Yes /No with options:49760::no}    Physical Activity:  Exercise regularly: yes, but not as much as he would like too  Exercise type / duration/ frequency: walking for about 15 minutes most days per week   What are the barriers to exercise: time     Sleep:  Average sleep duration: 4 hrs.   Using CPAP: yes  Feels well rested in the morning: YES      Stress:  Perceived stress level during the last year: 4-5/10  How do you think that the stress is affecting you:***  Cause for stress: {SM Causes of Stress:58026}    Co-morbidities related to obesity:  hypertension, obstructive sleep apnea, type 2 diabetes mellitus, anxiety disorder, depression, GERD and IBS,Gout       Anthropometrics:  Current Weight:   Wt Readings from Last 1 Encounters:   01/15/22 (!) 221.2 kg (487 lb 9.6 oz)     Current Height:   Ht Readings from Last 1  Encounters:   01/15/22 178 cm (5' 10.08)     Current BMI:   BMI Readings from Last 1 Encounters:   01/15/22 69.81 kg/m??      IBW    %Ideal Body Weight:      ABW:    Goal Weight (per patient): ***      Nutrition Focused Physical Exam:                             Relevant Labs:  Lab Results   Component Value Date    HGB 14.3 01/15/2022    HCT 42.6 01/15/2022    MCV 95.0 01/15/2022    IRON 63 (L) 01/15/2022    TIBC 277 01/15/2022    TRANSFERRIN 244.3 03/24/2019    LABIRON 23 01/15/2022    FERRITIN 125.0 01/15/2022    VITDTOTAL 16.7 (L) 01/15/2022    VITAMINB12 347 01/15/2022    A1C 7.9 (H) 01/15/2022    EAG 180 01/15/2022       Relevant Medications/Supplements:  Reviewed nutritionally relevant medications and supplements.  ***  Current Outpatient Medications   Medication Sig Dispense Refill   ??? acetaminophen (TYLENOL) 500 MG tablet Take 2 tablets (1,000 mg total) by mouth Three (3) times a day. 30 tablet 0   ??? adalimumab (HUMIRA,CF, PEN) 80 mg/0.8 mL PnKt Inject the contents of 1 pen (80mg ) under the skin once a week. (Patient not taking: Reported on 01/15/2022) 4 each 11   ??? albuterol 2.5 mg /3 mL (0.083 %) nebulizer solution Inhale 3 mL (2.5 mg total) by nebulization every four (4) hours as needed for wheezing. (Patient not taking: Reported on 01/15/2022) 180 mL 2   ??? albuterol HFA 90 mcg/actuation inhaler Inhale 2 puffs every six (6) hours as needed for wheezing. (Patient not taking: Reported on 01/15/2022) 8 g 5   ??? ALPRAZolam (XANAX) 0.5 MG tablet Takes 1 tablet nightly and 1 tablet a day when needed     ??? ascorbic acid (VITAMIN C ORAL) Take 1 capsule by mouth nightly.     ??? blood sugar diagnostic (ACCU-CHEK GUIDE TEST STRIPS) Strp by Other route Three (3) times a day before meals. 300 each 1   ??? blood-glucose meter kit Use as directed 1 each 0   ??? budesonide-formoteroL (SYMBICORT) 80-4.5 mcg/actuation inhaler Inhale 2 puffs Two (2) times a day. 10.2 g 11   ??? cetirizine (ZYRTEC) 10 MG tablet Take 1 tablet (10 mg total) by mouth daily. 90 tablet 1   ??? chlorhexidine (PERIDEX) 0.12 % solution Rinse with 1/2 ounce by mouth for 30 seconds then spit out. use twice a day (Patient not taking: Reported on 01/15/2022)     ??? clindamycin (CLEOCIN T) 1 % external solution clindamycin phosphate 1 % topical solution   APPLY TOPICALLY TO GROIN AREA TWICE A DAY     ??? clindamycin (CLEOCIN T) 1 % lotion APPLY TOPICALLY AT NIGHT TO INFLAMED AREAS AS NEEDED FOR FLARES     ??? clobetasoL (TEMOVATE) 0.05 % Gel      ??? clobetasoL (TEMOVATE) 0.05 % ointment Apply daily to painful affected areas if needed for flares, then stop 15 g 1   ??? colchicine (MITIGARE) 0.6 mg cap capsule TAKE 2 CAPSULES BY MOUTH ON DAY ONE, FOLLOWED BY ONE CAPSULE ONE HOUR LATER, ON DAY 2 TAKE ONE CAPSULE BY MOUTH TWICEA DAY UNTIL RESOLVED 60 capsule 0   ??? cyclobenzaprine (FLEXERIL) 10 MG tablet Take 1  tablet (10 mg total) by mouth Three (3) times a day as needed for muscle spasms. 60 tablet 1   ??? dextroamphetamine sulfate (DEXTROSTAT) 10 MG tablet Take 2 tablets (20 mg total) by mouth two (2) times a day.     ??? diphenoxylate-atropine (LOMOTIL) 2.5-0.025 mg per tablet Take 1 tablet by mouth two (2) times a day as needed for diarrhea. 30 tablet 5   ??? dulaglutide (TRULICITY) 3 mg/0.5 mL injection pen Inject 0.5 mL (3 mg total) under the skin every seven (7) days. 2 mL 2   ??? DULoxetine (CYMBALTA) 60 MG capsule Take 1 capsule (60 mg total) by mouth Two (2) times a day. 180 capsule 0   ??? empty container Misc Use as directed to dispose of needles. When full, make sure lid is closed tightly then dispose of container in trash. 1 each 2   ??? erenumab-aooe (AIMOVIG AUTOINJECTOR) 140 mg/mL AtIn Inject 140 mg under the skin every thirty (30) days. 3 mL 1   ??? famotidine (PEPCID) 20 MG tablet Take 1 tablet (20 mg total) by mouth Two (2) times a day. 180 tablet 1   ??? fluoride, sodium, 1.1 % Pste      ??? fluticasone propionate (FLONASE) 50 mcg/actuation nasal spray 1 spray into each nostril Two (2) times a day. (Patient not taking: Reported on 01/15/2022) 16 g 5   ??? glipiZIDE (GLUCOTROL) 10 MG tablet Take 2 tablets (20 mg total) by mouth Two (2) times a day (30 minutes before a meal). 360 tablet 1   ??? HUMALOG U-100 INSULIN 100 unit/mL injection INJECT 120 UNITS UNDER THE SKIN DAILY VIA OMNIPOD 40 mL 5   ??? hydroCHLOROthiazide (HYDRODIURIL) 25 MG tablet Take 1 tablet (25 mg total) by mouth daily. 90 tablet 3   ??? hydrocortisone (PROCTOSOL HC) 2.5 % rectal cream Insert 1 application into the rectum two (2) times a day as needed. (Patient not taking: Reported on 01/15/2022) 28.35 g 2   ??? hydrOXYzine (ATARAX) 25 MG tablet Take 1 tablet (25 mg total) by mouth Three (3) times a day as needed. (Patient taking differently: Take 1 tablet (25 mg total) by mouth four (4) times a day as needed.) 120 tablet 1   ??? ibuprofen (ADVIL,MOTRIN) 800 MG tablet Take 1 tablet (800 mg total) by mouth every eight (8) hours as needed. (Patient not taking: Reported on 01/15/2022) 90 tablet 1   ??? insulin glargine (LANTUS) 100 unit/mL injection Inject 0.05 mL (5 Units total) under the skin nightly. 10 mL 3   ??? insulin pump cart,automated,BT (OMNIPOD 5 G6 PODS, GEN 5,) Crtg Change POD every 48 hours. 3 each 11   ??? insulin syringe-needle U-100 1 mL 31 gauge x 5/16 (8 mm) Syrg Once daily for insulin dosing. 100 each 0   ??? ketoconazole (NIZORAL) 2 % cream APPLY TO AFFECTED AREA OF THE GROIN DAILY (Patient not taking: Reported on 01/15/2022)     ??? lancets (ACCU-CHEK FASTCLIX LANCET DRUM) Misc Use to check blood sugars 3 times a day before meals or as directed 200 each 5   ??? levothyroxine (SYNTHROID) 50 MCG tablet Take 1 tablet (50 mcg total) by mouth daily. 90 tablet 1   ??? lidocaine (XYLOCAINE) 5 % ointment lidocaine 5 % topical ointment     ??? montelukast (SINGULAIR) 10 mg tablet Take 1 tablet (10 mg total) by mouth daily. 90 tablet 3   ??? multivitamin (MULTIPLE VITAMIN ORAL) Take 1 tablet by mouth daily.     ???  nystatin (MYCOSTATIN) 100,000 unit/gram powder APPLY TO FOLDS ON ABDOMEN THREE TIMES DAILY (Patient not taking: Reported on 01/15/2022)     ??? ondansetron (ZOFRAN) 4 MG tablet Take 1 tablet (4 mg total) by mouth every eight (8) hours as needed for nausea. 30 tablet 1   ??? oxybutynin (DITROPAN) 5 MG tablet Take 1 tablet (5 mg total) by mouth Two (2) times a day. 180 tablet 1   ??? pantoprazole (PROTONIX) 40 MG tablet Take 1 tablet (40 mg total) by mouth Two (2) times a day. 180 tablet 1   ??? polyethylene glycol (GLYCOLAX) 17 gram/dose powder      ??? promethazine (PHENERGAN) 25 MG tablet Take 1 tablet (25 mg total) by mouth every eight (8) hours as needed for nausea. 30 tablet 1   ??? propranoloL (INDERAL LA) 120 mg 24 hr capsule Take 1 capsule (120 mg total) by mouth daily. 90 capsule 1   ??? ramelteon (ROZEREM) 8 mg tablet Take 1 tablet (8 mg total) by mouth nightly.     ??? sour cherry extract (TART CHERRY EXTRACT) 1,000 mg cap      ??? topiramate (TOPAMAX) 50 MG tablet Take 1.5 tablets (75 mg total) by mouth Two (2) times a day. 270 tablet 1     No current facility-administered medications for this visit.       Allergies:  Allergies   Allergen Reactions   ??? Erythromycin Nausea And Vomiting     Other reaction(s): Vomiting   ??? Penicillins Rash, Swelling and Hives   ??? Sulfa (Sulfonamide Antibiotics) Nausea And Vomiting and Nausea Only   ??? Other        Food Intolerances/Dietary Restrictions:  {Dietary Restrictions:1211321}    Diagnosis/Impression:  The patient is currently *** estimated nutrition needs related to ***.  Per anthropometric data, BMI: *** classifies patient as obese (class ***).  Current diet is *** in protein and ***non-starchy vegetables.  Beverages are ***.  Nutrition labs from *** did not show any micronutrient deficiencies.  Blood sugar control is ***.      Malnutrition Assessment using AND/ASPEN Clinical Characteristics:                             Nutrition Goals & Evaluation      Apply dietary recommendations to achieve goal weight of *** lb ({MNT Goal Progress:77446})  Sustainable weight loss of 5-10% (*** lb) in the next 6 months ({MNT Goal Progress:77446})  Meet nutritional needs  ({MNT Goal Progress:77446})  Reduce obesity-related long-term health risk  ({MNT Goal Progress:77446})  ***  ({MNT Goal Progress:77446})  ***  ({MNT Goal Progress:77446})    Nutrition goals reviewed, and relevant barriers identified and addressed: {MNT Barriers to Care:77905}. He is evaluated to have {DESC; GOOD/FAIR/POOR:18685} willingness and ability to achieve nutrition goals.     Nutrition Assessment       He has {Overweight/Obesity:30106::Overweight} as evidenced by a There is no height or weight on file to calculate BMI.. Barriers to weight loss provided include:  ?? Handouts promoting nutrition intervention  ?? Lists of foods recommended and foods not recommended  ?? Sample menu plan(s), recipes supporting nutrition intervention  ?? Online resources  ?? After visit summary with patient instructions  and contact information     Estimated Needs:  Daily Estimated Nutrient Needs:  Energy: 3148 kcals Per [employer]-Jeor Equation using last recorded weight, 222.3 kg (02/14/22 1300)]  Protein: 108-134 gm [0.8-1 gm/kg using adjusted body  weight, 134.1 kg (02/14/22 1300)]  Carbohydrate:   [45% of kcal]  Fluid:   mL [1 mL/kcal (maintenance)]      Follow up will occur in 4-8 weeks.     Food/Nutrition-related history, Anthropometric measurements, Biochemical data, medical tests, procedures, Nutrition-focused physical findings, Patient understanding or compliance with intervention and recommendations , Effectiveness of nutrition interventions and Effectiveness of nutrition prescription/order   will be assessed at time of follow-up.         Recommendations for Referring Provider :  See instructions            The patient reports they are currently: at home. I spent 33.23 minutes on the real-time audio and video visit with the patient on the date of service. I spent an additional 30 minutes on pre- and post-visit activities on the date of service.     The patient was not located and I was located within 250 yards of a hospital based location during the real-time audio and video visit. The patient was physically located in [place] or a state in which I am permitted to provide care. The patient and/or parent/guardian understood that s/he may incur co-pays and cost sharing, and agreed to the telemedicine visit. The visit was reasonable and appropriate under the circumstances given the patient's presentation at the time.    The patient and/or parent/guardian has been advised of the potential risks and limitations of this mode of treatment (including, but not limited to, the absence of in-person examination) and has agreed to be treated using telemedicine. The patient's/patient's family's questions regarding telemedicine have been answered.    If the visit was completed in an ambulatory setting, the patient and/or parent/guardian has also been advised to contact their provider???s office for worsening conditions, and seek emergency medical treatment and/or call 911 if the patient deems either necessary.

## 2022-02-14 ENCOUNTER — Encounter (INDEPENDENT_AMBULATORY_CARE_PROVIDER_SITE_OTHER): Payer: Self-pay | Admitting: Nurse Practitioner

## 2022-02-14 ENCOUNTER — Ambulatory Visit (INDEPENDENT_AMBULATORY_CARE_PROVIDER_SITE_OTHER): Payer: 59 | Admitting: Nurse Practitioner

## 2022-02-14 ENCOUNTER — Ambulatory Visit: Admit: 2022-02-14 | Discharge: 2022-02-15 | Payer: MEDICARE

## 2022-02-14 VITALS — BP 157/86 | HR 76 | Resp 18 | Ht 71.0 in | Wt >= 6400 oz

## 2022-02-14 DIAGNOSIS — N529 Male erectile dysfunction, unspecified: Principal | ICD-10-CM

## 2022-02-14 DIAGNOSIS — E291 Testicular hypofunction: Principal | ICD-10-CM

## 2022-02-14 DIAGNOSIS — E1069 Type 1 diabetes mellitus with other specified complication: Principal | ICD-10-CM

## 2022-02-14 DIAGNOSIS — I890 Lymphedema, not elsewhere classified: Secondary | ICD-10-CM | POA: Diagnosis not present

## 2022-02-14 DIAGNOSIS — I83813 Varicose veins of bilateral lower extremities with pain: Secondary | ICD-10-CM

## 2022-02-14 MED ORDER — TESTOSTERONE CYPIONATE 200 MG/ML INTRAMUSCULAR OIL
INTRAMUSCULAR | 5 refills | 28 days | Status: CP
Start: 2022-02-14 — End: ?

## 2022-02-14 MED ORDER — SAFETY NEEDLES 18 GAUGE X 1 1/2"
11 refills | 0 days | Status: CP
Start: 2022-02-14 — End: ?

## 2022-02-14 MED ORDER — TADALAFIL 5 MG TABLET
ORAL_TABLET | Freq: Every day | ORAL | 3 refills | 90 days | Status: CP
Start: 2022-02-14 — End: ?

## 2022-02-14 MED ORDER — BD SAFETYGLIDE NEEDLE 23 GAUGE X 1"
11 refills | 0 days | Status: CP
Start: 2022-02-14 — End: ?

## 2022-02-14 NOTE — Unmapped (Signed)
[facility] NEW PATIENT VISIT    ID/CC:    Chief Complaint   Patient presents with    Hypogonadism       Consult requested by:     Loran Senters, FNP  [address]  [place],  [place] [address]    History of Present Illness:   Logan Quinn is a 41 y.o. male who presents to me in consultation for ED.    ED   Patient has had a slow decline in erectile function   Spontaneous   When attempting intercourse   He has tried sildenafil 100mg  po prn , did not help   Not tried tadalafil         Hypogonadism   Slow decline in erectile function and libido   Noted prior low values of 105 and 122    Held on TRT due to fertility concerns, now done with children   Interested in TRT      Labs Reviewed today with the patient include:    Lab Results   Component Value Date    Testosterone 105 (L) 01/09/2021    Testosterone 122 (L) 03/24/2019    Testosterone 124 (L) 04/22/2018    FSH 3.7 04/22/2018    LH 4.7 04/22/2018    Prolactin 20.4 (H) 04/22/2018    Hemoglobin A1C 7.9 (H) 01/15/2022    Hemoglobin A1C 7.3 (A) 10/10/2021    Hemoglobin A1C 7.3 (A) 07/13/2021    Hemoglobin A1C 7.9 (A) 04/12/2021    Hemoglobin A1C 6.0 09/15/2014    Hemoglobin A1C 6.6 (H) 05/18/2014    Hemoglobin A1C 8.8 (H) 02/16/2014      Lab Results   Component Value Date/Time    PSA 0.35 04/22/2018 02:49 PM             Past Medical Hx:    Past Medical History:   Diagnosis Date    Acne     Acquired hypothyroidism 01/09/2021    Allergic     Anxiety     Depression     Diabetes mellitus (CMS-HCC) Dx 2013    Type II    GERD (gastroesophageal reflux disease)     Gout     Headache     Hypertension     IBS (irritable bowel syndrome)     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    Liver disease     Migraines     Mild intermittent asthma without complication 10/11/2021    Morbid obesity with BMI of 60.0-69.9, adult (CMS-HCC)     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)     Obstructive sleep apnea     OSA on CPAP     Retinal detachment 04/2014    Severe obstructive sleep apnea Trapezius muscle strain 12/07/2013    Urinary incontinence, nocturnal enuresis     Venous insufficiency      Patient Active Problem List   Diagnosis    Diabetes type 2, uncontrolled    Lesion of radial nerve    GERD (gastroesophageal reflux disease) (RAF-HCC)    Depression (RAF-HCC)    Suicidal ideation    Abdominal pain    Headache    NAFLD (nonalcoholic fatty liver disease)    Bleeding external hemorrhoids    Anxiety    Diarrhea    Obesity    Type 2 diabetes mellitus with hyperglycemia (CMS-HCC)    Left shoulder pain    Trapezius muscle strain    Primary insomnia    Chronic joint pain  Cellulitis of right lower extremity    No diabetic retinopathy in both eyes    Congenital hypertrophy of retinal pigment epithelium    H/O retinal detachment    IBS (irritable bowel syndrome)    Urinary incontinence, nocturnal enuresis    Severe obstructive sleep apnea    OSA on CPAP    Hidradenitis suppurativa    Acute non-recurrent pansinusitis    Acquired hypothyroidism    Essential hypertension    Mild intermittent asthma without complication    Chronic pansinusitis    Environmental and seasonal allergies    Erectile dysfunction    Sinus congestion    Nasal congestion    Cough variant asthma       Past Surgical Hx:    Past Surgical History:   Procedure Laterality Date    EYE SURGERY  04/2014    LASER ABLATION INCOMPETENT VEIN [facility] HISTORICAL RESULT) Right     PR COLONOSCOPY FLX DX W/COLLJ SPEC WHEN PFRMD N/A 03/24/2013    Procedure: COLONOSCOPY, FLEXIBLE, PROXIMAL TO SPLENIC FLEXURE; DIAGNOSTIC, W/WO COLLECTION SPECIMEN BY BRUSH OR WASH;  Surgeon: Clint Bolder, MD;  [facility];  Service: Gastroenterology    PR EYE SURG POST SGMT PROC UNLISTED Left     pneumatic retinopexy OS    PR UPPER GI ENDOSCOPY,DIAGNOSIS N/A 02/02/2013    Procedure: UGI ENDO, INCLUDE ESOPHAGUS, STOMACH, & DUODENUM &/OR JEJUNUM; DX W/WO COLLECTION SPECIMN, BY BRUSH OR WASH;  Surgeon: Malcolm Metro, MD;  Location: [facility];  Service: Gastroenterology    [place] PYLORIC STENOSIS ([place] HISTORICAL RESULT)           Active Meds:    Outpatient Medications Marked as Taking for the 02/14/22 encounter (Office Visit) with Lytle Butte, MD   Medication Sig Dispense Refill    acetaminophen (TYLENOL) 500 MG tablet Take 2 tablets (1,000 mg total) by mouth Three (3) times a day. 30 tablet 0    albuterol HFA 90 mcg/actuation inhaler Inhale 2 puffs every six (6) hours as needed for wheezing. 8 g 5    ALPRAZolam (XANAX) 0.5 MG tablet Takes 1 tablet nightly and 1 tablet a day when needed      ascorbic acid (VITAMIN C ORAL) Take 1 capsule by mouth nightly.      blood sugar diagnostic (ACCU-CHEK GUIDE TEST STRIPS) Strp by Other route Three (3) times a day before meals. 300 each 1    budesonide-formoteroL (SYMBICORT) 80-4.5 mcg/actuation inhaler Inhale 2 puffs Two (2) times a day. 10.2 g 11    cetirizine (ZYRTEC) 10 MG tablet Take 1 tablet (10 mg total) by mouth daily. 90 tablet 1    clindamycin (CLEOCIN T) 1 % external solution clindamycin phosphate 1 % topical solution   APPLY TOPICALLY TO GROIN AREA TWICE A DAY      clindamycin (CLEOCIN T) 1 % lotion APPLY TOPICALLY AT NIGHT TO INFLAMED AREAS AS NEEDED FOR FLARES      clobetasoL (TEMOVATE) 0.05 % Gel       clobetasoL (TEMOVATE) 0.05 % ointment Apply daily to painful affected areas if needed for flares, then stop 15 g 1    colchicine (MITIGARE) 0.6 mg cap capsule TAKE 2 CAPSULES BY MOUTH ON DAY ONE, FOLLOWED BY ONE CAPSULE ONE HOUR LATER, ON DAY 2 TAKE ONE CAPSULE BY MOUTH TWICEA DAY UNTIL RESOLVED 60 capsule 0    cyclobenzaprine (FLEXERIL) 10 MG tablet Take 1 tablet (10 mg total) by mouth Three (3)  times a day as needed for muscle spasms. 60 tablet 1    dextroamphetamine sulfate (DEXTROSTAT) 10 MG tablet Take 2 tablets (20 mg total) by mouth two (2) times a day.      diphenoxylate-atropine (LOMOTIL) 2.5-0.025 mg per tablet Take 1 tablet by mouth two (2) times a day as needed for diarrhea. 30 tablet 5    dulaglutide (TRULICITY) 3 mg/0.5 mL injection pen Inject 0.5 mL (3 mg total) under the skin every seven (7) days. 2 mL 2    DULoxetine (CYMBALTA) 60 MG capsule Take 1 capsule (60 mg total) by mouth Two (2) times a day. 180 capsule 0    empty container Misc Use as directed to dispose of needles. When full, make sure lid is closed tightly then dispose of container in trash. 1 each 2    erenumab-aooe (AIMOVIG AUTOINJECTOR) 140 mg/mL AtIn Inject 140 mg under the skin every thirty (30) days. 3 mL 1    famotidine (PEPCID) 20 MG tablet Take 1 tablet (20 mg total) by mouth Two (2) times a day. 180 tablet 1    fluoride, sodium, 1.1 % Pste       glipiZIDE (GLUCOTROL) 10 MG tablet Take 2 tablets (20 mg total) by mouth Two (2) times a day (30 minutes before a meal). 360 tablet 1    HUMALOG U-100 INSULIN 100 unit/mL injection INJECT 120 UNITS UNDER THE SKIN DAILY VIA OMNIPOD 40 mL 5    hydroCHLOROthiazide (HYDRODIURIL) 25 MG tablet Take 1 tablet (25 mg total) by mouth daily. 90 tablet 3    insulin glargine (LANTUS) 100 unit/mL injection Inject 0.05 mL (5 Units total) under the skin nightly. 10 mL 3    insulin pump cart,automated,BT (OMNIPOD 5 G6 PODS, GEN 5,) Crtg Change POD every 48 hours. 3 each 11    insulin syringe-needle U-100 1 mL 31 gauge x 5/16 (8 mm) Syrg Once daily for insulin dosing. 100 each 0    lancets (ACCU-CHEK FASTCLIX LANCET DRUM) Misc Use to check blood sugars 3 times a day before meals or as directed 200 each 5    lidocaine (XYLOCAINE) 5 % ointment lidocaine 5 % topical ointment      multivitamin (MULTIPLE VITAMIN ORAL) Take 1 tablet by mouth daily.      ondansetron (ZOFRAN) 4 MG tablet Take 1 tablet (4 mg total) by mouth every eight (8) hours as needed for nausea. 30 tablet 1    oxybutynin (DITROPAN) 5 MG tablet Take 1 tablet (5 mg total) by mouth Two (2) times a day. 180 tablet 1    pantoprazole (PROTONIX) 40 MG tablet Take 1 tablet (40 mg total) by mouth Two (2) times a day. 180 tablet 1    polyethylene glycol (GLYCOLAX) 17 gram/dose powder       promethazine (PHENERGAN) 25 MG tablet Take 1 tablet (25 mg total) by mouth every eight (8) hours as needed for nausea. 30 tablet 1    propranoloL (INDERAL LA) 120 mg 24 hr capsule Take 1 capsule (120 mg total) by mouth daily. 90 capsule 1    ramelteon (ROZEREM) 8 mg tablet Take 1 tablet (8 mg total) by mouth nightly.      sour cherry extract (TART CHERRY EXTRACT) 1,000 mg cap       topiramate (TOPAMAX) 50 MG tablet Take 1.5 tablets (75 mg total) by mouth Two (2) times a day. 270 tablet 1        Allergies:    Allergies as of 02/14/2022 -  Reviewed 02/14/2022   Allergen Reaction Noted    Erythromycin Nausea And Vomiting 01/25/2015    Penicillins Rash, Swelling, and Hives 12/06/2012    Sulfa (sulfonamide antibiotics) Nausea And Vomiting and Nausea Only 12/30/2012    Other           OBJECTIVE:  Physical Exam:  BP 132/70 (BP Site: L Arm, BP Position: Sitting, BP Cuff Size: Large)  - Pulse 75  - Ht 180.3 cm (5' 11)  - Wt (!) 222.3 kg (490 lb)  - BMI 68.34 kg/m??           ASSESSMENT/PLAN:   Wetzel Muhs is a 41 y.o. male with hypogonadism and ED    Hypogonadism   Had a long discussion about the risks and benefits of testosterone therapy. Most recent evidence indicates that TRT will not worsen BPH symptoms and at times may improve those symptoms. Evidence also exists that it may improve metabolic syndrome / glucose control, lipid profile, and cardiovascular health. There is however a blackbox warning as a 4 select studies did demonstrate in men who have other co morbities that MI is a risk. If HCT becomes too elevated, >50, risk for MI, stroke, blood clots increase, and even death.  There is debated evidence as to whether TRT worsens or increases risk for PCa. Latest evidence indicates that TRT usage may increase PSA levels leading to increased biopsy rates, the rate of discovery of cancer is not much different. PSA and HCT are still monitored closely in this patients though.     Discussed the 3 initial options for replacing TRT including topical gels (Androgel), intranasal gels (Natesto) , or injectable T cypionate.  Each has their advantages and disadvantages. Topical gels must be applied daily and allowed to dry 20-30 minutes before wearing a shirt and patient should be avoiding contact with children or women of child bearing age.  Intranasal testosterone must be applied BID (or TID) to each nostril. It may have improved absorption as it is applied to a mucosal membrane. It could possibly cause nasal irritation or bloody noses. It MAY have less effects on fertility if that is a concern. IM injectable T cypionate is an injectable option that has improved pharmacokinetics due to the direct injection. Injection is performed by patient at home weekly or once every other week. Due to the improved absorption patients are more likely to have increased HCT.     T cyp 100mg  IM weekly rx provided   18g and 23g provided    T, hematocrit, estradiol - ordered for 3 months    ED   Tadalafil 5mg  po every day rx provided    Rtc 4 mos       Thank you very much for sending Logan Quinn to our clinic at [facility].

## 2022-02-14 NOTE — Unmapped (Signed)
Nutrition Instructions  with IBS considerations    Try to keep a consistent meal pattern and avoid skipping any meals.    Spread meal out into about 5-6 small meals.    Meals should generally be no more than 4 hours apart.     Take your time to eat and avoid eating with distractions.     Eat only to your natural satiety level. You should feel satisfied, not ???uncomfortable.???     Focus on healthy food options like lean protein and non-starchy vegetables.   Follow the plate method for serving suggestions.    Cooking vegetables and fruit may be better tolerated than raw.      Avoid fried or greasy foods that are unhealthy and may be difficult to tolerate.      Limit intake of caffeine, spicy and acidic foods     Avoid intake of artificial/sugary beverages. Hydrate with water daily (at least 64 ounces per day)    Try to stay active as able throughout your day. Aim for a minimum of 150 minutes of moderate/intense physical activity per week.     If you are starting an exercise routine for the first time, check with your physician to ensure that it is safe for you to do so. Start slowly to avoid possible injury or burnout.

## 2022-02-16 MED ORDER — LEVOTHYROXINE 50 MCG TABLET
ORAL_TABLET | Freq: Every day | ORAL | 1 refills | 90 days
Start: 2022-02-16 — End: 2023-02-16

## 2022-02-17 MED ORDER — LEVOTHYROXINE 50 MCG TABLET
ORAL_TABLET | Freq: Every day | ORAL | 1 refills | 90 days | Status: CP
Start: 2022-02-17 — End: 2023-02-17

## 2022-02-19 ENCOUNTER — Ambulatory Visit: Admit: 2022-02-19 | Discharge: 2022-02-20 | Payer: MEDICARE | Attending: Family | Primary: Family

## 2022-02-19 NOTE — Unmapped (Signed)
Assessment and Plan:     Logan Quinn was seen today for annual exam.    Diagnoses and all orders for this visit:    Annual physical exam  Discussed healthy behaviors including well balanced diet and regular exercise.   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 Paperwork completed and copy provided to patient.     Need for influenza vaccination  -     INFLUENZA VACCINE (QUAD) IM - 6 MO-ADULT - PF         Barriers to goals identified and addressed. Pertinent handouts were given today and reviewed with the patient as indicated.  The Care Plan and Self-Management goals have been included on the AVS and the AVS has been printed.   I encouraged the patient to keep regular logs for me to review at their next visit. Any outside resources or referrals needed at this time are noted above. Patient's current medications have been reviewed. Any new medications prescribed have been discussed, and side effects have been addressed.  Have assessed the patient's understanding, respsonse, and barriers to adherence to medications.Patient voiced understanding and all questions have been answered to satisfaction.     Subjective:      Logan Quinn is a 41 y.o. male being seen for a comprehensive physical exam.        Chief Complaint   Patient presents with    Annual Exam     [employer] scout physical.       Lifestyle:  Employment: unemployed   Exercise: no devoted regimen  Diet: diabetic, low sodium/low cholesterol   Caffeine Use: soda, has cut back on intake over the past year  Last Dentist: UTD   Last Vision: UTD     PHQ-2 Score:0       ASCVD risk:  The 10-year ASCVD risk score (Arnett DK, et al., 2019) is: 1.7%    Values used to calculate the score:      Age: 59 years      Sex: Male      Is Non-Hispanic African American: No      Diabetic: Yes      Tobacco smoker: No      Systolic Blood Pressure: 114 mmHg      Is BP treated: Yes      HDL Cholesterol: 53 mg/dL      Total Cholesterol: 191 mg/dL    Note: For patients with SBP <90 or >200, Total Cholesterol <130 or >320, HDL <20 or >100 which are outside of the allowable range, the calculator will use these upper or lower values to calculate the patient???s risk score.       Social History:     Social History     Socioeconomic History    Marital status: Married     Spouse name: None    Number of children: None    Years of education: None    Highest education level: None   Tobacco Use    Smoking status: Never     Passive exposure: Never    Smokeless tobacco: Never   Vaping Use    Vaping Use: Never used   Substance and Sexual Activity    Alcohol use: Never     Comment: rare social    Drug use: Never    Sexual activity: Yes     Partners: Female     Birth control/protection: None   Other Topics Concern    Do you use sunscreen? Yes    Tanning bed use?  No    Are you easily burned? No    Excessive sun exposure? No    Blistering sunburns? No     Social Determinants of Health     Financial Resource Strain: Low Risk  (06/20/2021)    Overall Financial Resource Strain (CARDIA)     Difficulty of Paying Living Expenses: Not very hard   Food Insecurity: No Food Insecurity (06/20/2021)    Hunger Vital Sign     Worried About Running Out of Food in the Last Year: Never true     Ran Out of Food in the Last Year: Never true   Transportation Needs: No Transportation Needs (06/20/2021)    PRAPARE - Therapist, art (Medical): No     Lack of Transportation (Non-Medical): No       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    Liver disease     Migraines     Mild intermittent asthma without complication 10/11/2021    Morbid obesity with BMI of 60.0-69.9, adult (CMS-HCC) 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)     Obstructive sleep apnea     OSA on CPAP     Retinal detachment 04/2014    Severe obstructive sleep apnea     Trapezius muscle strain 12/07/2013    Urinary incontinence, nocturnal enuresis     Venous insufficiency      Past Surgical History:   Procedure Laterality Date    EYE SURGERY  04/2014    LASER ABLATION INCOMPETENT VEIN INI [facility] HISTORICAL RESULT) Right     PR COLONOSCOPY FLX DX W/COLLJ SPEC WHEN PFRMD N/A 03/24/2013    Procedure: COLONOSCOPY, FLEXIBLE, PROXIMAL TO SPLENIC FLEXURE; DIAGNOSTIC, W/WO COLLECTION SPECIMEN BY BRUSH OR WASH;  Surgeon: Clint Bolder, MD;  Location: [facility];  Service: Gastroenterology    PR EYE SURG POST SGMT PROC UNLISTED Left     pneumatic retinopexy OS    PR UPPER GI ENDOSCOPY,DIAGNOSIS N/A 02/02/2013    Procedure: UGI ENDO, INCLUDE ESOPHAGUS, STOMACH, & DUODENUM &/OR JEJUNUM; DX W/WO COLLECTION SPECIMN, BY BRUSH OR WASH;  Surgeon: Malcolm Metro, MD;  Location: [facility];  Service: Gastroenterology    [place] PYLORIC STENOSIS ([place] HISTORICAL RESULT)         Family History:     Family History   Problem Relation Age of Onset    Cancer Maternal Grandfather         Stomach Cancer    Hearing loss Maternal Grandfather     Cancer Paternal Grandfather         Bone Cancer, Lung Cancer    COPD Paternal Grandmother     Arthritis Paternal Grandmother     Depression Paternal Grandmother     Diabetes Mother     Heart disease Mother     Migraines Mother     Arthritis Mother     Depression Mother     GER disease Mother     Hypertension Mother  Angina Mother     COPD Mother     Glaucoma Mother     Hearing loss Mother     Cataracts Mother     Diabetes Sister     Migraines Sister     Asthma Sister     Depression Sister     Hearing loss Sister     Diabetes Brother     Asthma Brother     Diabetes Maternal Grandmother     Heart disease Maternal Grandmother     Migraines Maternal Grandmother     Depression Maternal Grandmother Angina Maternal Grandmother     Hypertension Maternal Grandmother     Stroke Maternal Grandmother     Diabetes Maternal Uncle     Hearing loss Maternal Uncle     Diabetes Maternal Uncle         resulted in need for kidney transplant    Liver disease Maternal Uncle     Kidney disease Maternal Uncle         needed kidney transplant    Asthma Brother     No Known Problems Father     No Known Problems Paternal Aunt     No Known Problems Paternal Uncle     No Known Problems Other     Colorectal Cancer Neg Hx     Esophageal cancer Neg Hx     Liver cancer Neg Hx     Pancreatic cancer Neg Hx     Stomach cancer Neg Hx     Amblyopia Neg Hx     Blindness Neg Hx     Retinal detachment Neg Hx     Strabismus Neg Hx     Macular degeneration Neg Hx     Anesthesia problems Neg Hx     Broken bones Neg Hx     Clotting disorder Neg Hx     Collagen disease Neg Hx     Dislocations Neg Hx     Fibromyalgia Neg Hx     Gout Neg Hx     Hemophilia Neg Hx     Osteoporosis Neg Hx     Rheumatologic disease Neg Hx     Scoliosis Neg Hx     Severe sprains Neg Hx     Sickle cell anemia Neg Hx     Spinal Compression Fracture Neg Hx     Melanoma Neg Hx     Basal cell carcinoma Neg Hx     Squamous cell carcinoma Neg Hx     Deep vein thrombosis Neg Hx     Thyroid disease Neg Hx        Allergies:     Erythromycin, Penicillins, Sulfa (sulfonamide antibiotics), and Other    Current Medications:     Current Outpatient Medications   Medication Sig Dispense Refill    acetaminophen (TYLENOL) 500 MG tablet Take 2 tablets (1,000 mg total) by mouth Three (3) times a day. 30 tablet 0    adalimumab (HUMIRA,CF, PEN) 80 mg/0.8 mL PnKt Inject the contents of 1 pen (80mg ) under the skin once a week. 4 each 11    albuterol 2.5 mg /3 mL (0.083 %) nebulizer solution Inhale 3 mL (2.5 mg total) by nebulization every four (4) hours as needed for wheezing. 180 mL 2    albuterol HFA 90 mcg/actuation inhaler Inhale 2 puffs every six (6) hours as needed for wheezing. 8 g 5 ALPRAZolam (XANAX) 0.5 MG tablet Takes 1 tablet nightly and 1 tablet a day when needed  ascorbic acid (VITAMIN C ORAL) Take 1 capsule by mouth nightly.      blood sugar diagnostic (ACCU-CHEK GUIDE TEST STRIPS) Strp by Other route Three (3) times a day before meals. 300 each 1    blood-glucose meter kit Use as directed 1 each 0    budesonide-formoteroL (SYMBICORT) 80-4.5 mcg/actuation inhaler Inhale 2 puffs Two (2) times a day. 10.2 g 11    chlorhexidine (PERIDEX) 0.12 % solution       clindamycin (CLEOCIN T) 1 % external solution clindamycin phosphate 1 % topical solution   APPLY TOPICALLY TO GROIN AREA TWICE A DAY      clindamycin (CLEOCIN T) 1 % lotion APPLY TOPICALLY AT NIGHT TO INFLAMED AREAS AS NEEDED FOR FLARES      clobetasoL (TEMOVATE) 0.05 % Gel       clobetasoL (TEMOVATE) 0.05 % ointment Apply daily to painful affected areas if needed for flares, then stop 15 g 1    cyclobenzaprine (FLEXERIL) 10 MG tablet Take 1 tablet (10 mg total) by mouth Three (3) times a day as needed for muscle spasms. 60 tablet 1    dextroamphetamine sulfate (DEXTROSTAT) 10 MG tablet Take 2 tablets (20 mg total) by mouth two (2) times a day.      diphenoxylate-atropine (LOMOTIL) 2.5-0.025 mg per tablet Take 1 tablet by mouth two (2) times a day as needed for diarrhea. 30 tablet 5    dulaglutide (TRULICITY) 3 mg/0.5 mL injection pen Inject 0.5 mL (3 mg total) under the skin every seven (7) days. 2 mL 2    DULoxetine (CYMBALTA) 60 MG capsule Take 1 capsule (60 mg total) by mouth Two (2) times a day. 180 capsule 0    empty container Misc Use as directed to dispose of needles. When full, make sure lid is closed tightly then dispose of container in trash. 1 each 2    erenumab-aooe (AIMOVIG AUTOINJECTOR) 140 mg/mL AtIn Inject 140 mg under the skin every thirty (30) days. 3 mL 1    famotidine (PEPCID) 20 MG tablet Take 1 tablet (20 mg total) by mouth Two (2) times a day. 180 tablet 1    fluoride, sodium, 1.1 % Pste       fluticasone propionate (FLONASE) 50 mcg/actuation nasal spray 1 spray into each nostril Two (2) times a day. 16 g 5    glipiZIDE (GLUCOTROL) 10 MG tablet Take 2 tablets (20 mg total) by mouth Two (2) times a day (30 minutes before a meal). 360 tablet 1    HUMALOG U-100 INSULIN 100 unit/mL injection INJECT 120 UNITS UNDER THE SKIN DAILY VIA OMNIPOD 40 mL 5    hydroCHLOROthiazide (HYDRODIURIL) 25 MG tablet Take 1 tablet (25 mg total) by mouth daily. 90 tablet 3    hydrocortisone (PROCTOSOL HC) 2.5 % rectal cream Insert 1 application into the rectum two (2) times a day as needed. 28.35 g 2    hydrOXYzine (ATARAX) 25 MG tablet Take 1 tablet (25 mg total) by mouth Three (3) times a day as needed. 120 tablet 1    ibuprofen (ADVIL,MOTRIN) 800 MG tablet Take 1 tablet (800 mg total) by mouth every eight (8) hours as needed. 90 tablet 1    insulin glargine (LANTUS) 100 unit/mL injection Inject 0.05 mL (5 Units total) under the skin nightly. 10 mL 3    insulin pump cart,automated,BT (OMNIPOD 5 G6 PODS, GEN 5,) Crtg Change POD every 48 hours. 3 each 11    insulin syringe-needle U-100 1 mL  31 gauge x 5/16 (8 mm) Syrg Once daily for insulin dosing. 100 each 0    ketoconazole (NIZORAL) 2 % cream       lancets (ACCU-CHEK FASTCLIX LANCET DRUM) Misc Use to check blood sugars 3 times a day before meals or as directed 200 each 5    levothyroxine (SYNTHROID) 50 MCG tablet Take 1 tablet (50 mcg total) by mouth daily. 90 tablet 1    lidocaine (XYLOCAINE) 5 % ointment lidocaine 5 % topical ointment      montelukast (SINGULAIR) 10 mg tablet Take 1 tablet (10 mg total) by mouth daily. 90 tablet 3    multivitamin (MULTIPLE VITAMIN ORAL) Take 1 tablet by mouth daily.      nystatin (MYCOSTATIN) 100,000 unit/gram powder       ondansetron (ZOFRAN) 4 MG tablet Take 1 tablet (4 mg total) by mouth every eight (8) hours as needed for nausea. 30 tablet 1    oxybutynin (DITROPAN) 5 MG tablet Take 1 tablet (5 mg total) by mouth Two (2) times a day. 180 tablet 1 pantoprazole (PROTONIX) 40 MG tablet Take 1 tablet (40 mg total) by mouth Two (2) times a day. 180 tablet 1    polyethylene glycol (GLYCOLAX) 17 gram/dose powder       promethazine (PHENERGAN) 25 MG tablet Take 1 tablet (25 mg total) by mouth every eight (8) hours as needed for nausea. 30 tablet 1    propranoloL (INDERAL LA) 120 mg 24 hr capsule Take 1 capsule (120 mg total) by mouth daily. 90 capsule 1    ramelteon (ROZEREM) 8 mg tablet Take 1 tablet (8 mg total) by mouth nightly.      safety needles (BD SAFETYGLIDE NEEDLE) 23 gauge x 1 Ndle 1 Units by Miscellaneous route once a week. 12 each 11    safety needles 18 gauge x 1 1/2 Ndle 1 Units by Miscellaneous route once a week. 12 each 11    sour cherry extract (TART CHERRY EXTRACT) 1,000 mg cap       tadalafiL (CIALIS) 5 MG tablet Take 1 tablet (5 mg total) by mouth in the morning. 90 tablet 3    testosterone cypionate (DEPOTESTOTERONE CYPIONATE) 200 mg/mL injection Inject 0.5 mL (100 mg total) into the muscle once a week. Inject 0.5cc (100mg ) weekly 2 mL 5    topiramate (TOPAMAX) 50 MG tablet Take 1.5 tablets (75 mg total) by mouth Two (2) times a day. 270 tablet 1    cetirizine (ZYRTEC) 10 MG tablet Take 1 tablet (10 mg total) by mouth daily. 90 tablet 1    colchicine (MITIGARE) 0.6 mg cap capsule TAKE 2 CAPSULES BY MOUTH ON DAY ONE, FOLLOWED BY ONE CAPSULE ONE HOUR LATER, ON DAY 2 TAKE ONE CAPSULE BY MOUTH TWICEA DAY UNTIL RESOLVED (Patient not taking: Reported on 02/19/2022) 60 capsule 0     No current facility-administered medications for this visit.       Health Maintenance:     Health Maintenance Summary w/Most Recent Date    -        Overdue - COVID-19 Vaccine (3 - Pfizer risk series) Overdue since 01/24/2021      12/27/2020  Imm Admin: COVID-19 VACC,MRNA,(PFIZER)(PF)    12/04/2020  Imm Admin: COVID-19 VACC,MRNA,(PFIZER)(PF)              Hemoglobin A1c (Every 3 Months) Next due on 04/17/2022      01/15/2022  Hemoglobin A1c component of Hemoglobin A1c 10/10/2021  HGB A1C, RAP/PDS component  of POCT glycosylated hemoglobin (Hb A1C)    07/13/2021  HGB A1C, RAP/PDS component of POCT glycosylated hemoglobin (Hb A1C)    04/12/2021  HGB A1C, RAP/PDS component of POCT glycosylated hemoglobin (Hb A1C)    01/09/2021  HGB A1C, RAP/PDS component of POCT glycosylated hemoglobin (Hb A1C)    Only the first 5 history entries have been loaded, but more history exists.              Urine Albumin/Creatinine Ratio (Yearly) Next due on 10/11/2022      10/10/2021  Registry Metric: [facility] DM LAST ALBQTUR    09/17/2019  Albumin/Creatinine Ratio component of Microalbumin, Urine Random    07/08/2018  Albumin/Creatinine Ratio component of Albumin/creatinine urine ratio    06/30/2017  Albumin/Creatinine Ratio component of Albumin/creatinine urine ratio    05/24/2016  Albumin/Creatinine Ratio component of Microalbumin/Creatinine Urine Ratio (LAB689)    Only the first 5 history entries have been loaded, but more history exists.              Retinal Eye Exam (Yearly) Next due on 10/31/2022      10/30/2021  SmartData: FINDINGS - PE - EYES - FUNDUSCOPIC - PERIPHERY - RIGHT PERIPHERY NORMAL    10/30/2021  SmartData: OPHTH FUNDUS OD PERIPHERY    10/30/2021  SmartData: OPHTH FUNDUS OS PERIPHERY    10/30/2021  OCT, Retina - OU - Both Eyes    09/20/2020  SmartData: OPHTH FUNDUS OD PERIPHERY    Only the first 5 history entries have been loaded, but more history exists.              Foot Exam (Yearly) Next due on 01/03/2023      01/02/2022  SmartData: WORKFLOW - DIABETES - DIABETIC FOOT EXAM PERFORMED    01/09/2021  SmartData: WORKFLOW - DIABETES - DIABETIC FOOT EXAM PERFORMED    11/18/2019  HM DIABETES FOOT EXAM    10/21/2018  HM DIABETES FOOT EXAM    10/06/2017  HM DIABETES FOOT EXAM    Only the first 5 history entries have been loaded, but more history exists.              Serum Creatinine Monitoring (Yearly) Next due on 01/16/2023      01/15/2022  Creatinine component of Comprehensive Metabolic Panel 01/18/2021  Creatinine component of Basic metabolic panel    07/04/2020  Creatinine component of Comprehensive Metabolic Panel    09/17/2019  Creatinine component of Comprehensive Metabolic Panel    04/25/2019  Creatinine component of Basic Metabolic Panel    Only the first 5 history entries have been loaded, but more history exists.              Potassium Monitoring (Yearly) Next due on 01/16/2023      01/15/2022  Potassium component of Comprehensive Metabolic Panel    01/18/2021  Potassium component of Basic metabolic panel    07/04/2020  Potassium component of Comprehensive Metabolic Panel    04/25/2019  Potassium component of Basic Metabolic Panel    04/21/2019  Potassium component of Basic Metabolic Panel    Only the first 5 history entries have been loaded, but more history exists.              DTaP/Tdap/Td Vaccines (4 - Td or Tdap) Next due on 07/17/2023      07/16/2013  Imm Admin: TdaP    07/18/2008  Imm Admin: TdaP    04/29/2000  Imm Admin: TD(TDVAX),ADSORBED,2LF(IM)(PF)  COPD Spirometry (Every 5 Years) Next due on 08/06/2026      08/06/2021  Flow volume loops pre/post    05/25/2020  Spirometry              Lipid Screening (Every 5 Years) Next due on 01/16/2027      01/15/2022  Lipid Panel    01/15/2022  SmartData: [facility] TOTAL CHOLESTEROL AND HDL COMPLETE    04/05/2013  Lipid panel (non-fasting)    01/01/2013  Lipid panel, fasting    12/30/2012  Lipid panel (non-fasting)              Hepatitis C Screen  Completed      04/29/2018  Hepatitis C Ab component of Hepatitis C Antibody    01/01/2013  Hepatitis C Antibody              Pneumococcal Vaccine 0-64 (Series Information) Completed      01/02/2022  Imm Admin: Pneumococcal Conjugate 20-valent    12/30/2012  Imm Admin: PNEUMOCOCCAL POLYSACCHARIDE 23              Influenza Vaccine (Series Information) Completed      02/19/2022  Imm Admin: Influenza Vaccine Quad (IIV4 PF) 21mo+ injectable    04/05/2021  Imm Admin: Influenza Virus Vaccine, unspecified formulation    03/10/2020  Imm Admin: Influenza Virus Vaccine, unspecified formulation    03/31/2019  Imm Admin: INFLUENZA INJ MDCK PF, QUAD,(FLUCELVAX)(28MO AND UP EGG FREE)    03/25/2019  Imm Admin: Influenza Virus Vaccine, unspecified formulation    Only the first 5 history entries have been loaded, but more history exists.                    Immunizations:     Immunization History   Administered Date(s) Administered    COVID-19 VACC,MRNA,(PFIZER)(PF) 12/04/2020, 12/27/2020    HEPATITIS B VACCINE ADULT,IM(ENERGIX B, RECOMBIVAX) 03/05/2013, 04/05/2013, 09/03/2013    Hepatitis B Vaccine, Unspecified Formulation 12/27/1999, 04/29/2000    INFLUENZA INJ MDCK PF, QUAD,(FLUCELVAX)(28MO AND UP EGG FREE) 03/31/2019    INFLUENZA TIV (TRI) PF (IM) 10/08/2011    Influenza Vaccine Quad (IIV4 PF) 71mo+ injectable 03/05/2013, 03/18/2014, 03/22/2015, 03/05/2016, 03/19/2017, 02/23/2018, 02/19/2022    Influenza Virus Vaccine, unspecified formulation 03/19/2017, 02/23/2018, 03/25/2019, 03/10/2020, 04/05/2021    PNEUMOCOCCAL POLYSACCHARIDE 23 12/30/2012    PPD Test 10/28/2016    Pneumococcal Conjugate 20-valent 01/02/2022    TD(TDVAX),ADSORBED,2LF(IM)(PF) 04/29/2000    TdaP 07/18/2008, 07/16/2013       I have reviewed and (if needed) updated the patient's problem list, medications, allergies, past medical and surgical history, social and family history.    ROS:     General: no fatigue, excess weight loss or gain, overall feels well  ENT: denies ear symptoms, throat symptoms, nasal congestion,   Cardiovascular: denies chest pain, palpitations, tachycardia  Respiratory: denies, dyspnea, dyspnea on exertion, orthopnea, wheezing, cough  Gastrointestinal: denies nausea, vomiting, dyspepsia.  No abdominal pain, chronic constipation or diarrhea, melena, hematochezia,   Genitourinary: denies urinary difficulties, erectile dysfunction  Musculoskeletal: denies significant joint pains, muscle pain or weakness  Integumentary: denies rashes  or other skin problems  Neurological: denies headaches, dizziness, numbness, tingling, syncope  Psychological: denies symptoms suggesting depression, anxiety, sleep disturbance    Vital Signs:     Wt Readings from Last 3 Encounters:   02/19/22 (!) 216.4 kg (477 lb)   02/14/22 (!) 222.3 kg (490 lb)   01/15/22 (!) 221.2 kg (487 lb 9.6 oz)     Temp  Readings from Last 3 Encounters:   02/19/22 37.2 ??C (98.9 ??F) (Oral)   01/15/22 36.7 ??C (98.1 ??F) (Oral)   01/02/22 37.2 ??C (99 ??F) (Oral)     BP Readings from Last 3 Encounters:   02/19/22 114/74   02/14/22 132/70   01/15/22 140/81     Pulse Readings from Last 3 Encounters:   02/19/22 68   02/14/22 75   01/15/22 72     Estimated body mass index is 68.29 kg/m?? as calculated from the following:    Height as of this encounter: 178 cm (5' 10.08).    Weight as of this encounter: 216.4 kg (477 lb).  Facility age limit for growth %iles is 20 years.        Objective:      General Appearance: Alert, cooperative, no distress. Morbidly obese.   EYES: PERRL, conjunctiva/corneas clear, EOM's intact, fundi  benign, both eyes  ENT:  External canals clear, Tympanic membrane pearly grey with normal light reflex bilaterally. No oropharyngeal lesions, mucous membranes moist.   NECK: No carotid bruits.  No palpable cervical or supraclavicular lymphadenopathy. Thyroid smooth, normal size  CV: Regular rate and rhythm. Normal S1 and S2. No murmurs, gallops, or rubs  RESP: Normal respiratory effort.  Clear to auscultation bilaterally without wheezes, rhonchi or crackles.   GI: Normal abdominal bowel sounds. Soft, non-tender and non-distended. No palpable masses or organomegaly.   GU: patient deferred   EXT:  Mild lower extremity edema. Posterior tibial pulses and dorsalis pedis pulses are 2+ and symmetric.    MSK: Full upright posture, smooth and symmetric gait. All major joints show full range of motion without discomfort. Strength is 5/5 in the upper and lower extremities.   SKIN: No rashes or suspicious focal lesions noted.   LYMPH NODES: Cervical, supraclavicular, and axillary nodes normal  NEURO: Cranial nerves II- XII grossly intact.  No focal neurologic deficits  PSYCHIATRIC: Alert and oriented x 3. Mood normal.     Labs:       No results found for this visit on 02/19/22.      [name], FNP

## 2022-02-22 ENCOUNTER — Encounter: Payer: Self-pay | Admitting: Emergency Medicine

## 2022-02-22 ENCOUNTER — Ambulatory Visit
Admission: EM | Admit: 2022-02-22 | Discharge: 2022-02-22 | Disposition: A | Discharge status: Home / Self Care | Payer: 59

## 2022-02-22 ENCOUNTER — Ambulatory Visit (INDEPENDENT_AMBULATORY_CARE_PROVIDER_SITE_OTHER): Payer: 59

## 2022-02-22 DIAGNOSIS — M25571 Pain in right ankle and joints of right foot: Secondary | ICD-10-CM

## 2022-02-22 DIAGNOSIS — S93401A Sprain of unspecified ligament of right ankle, initial encounter: Secondary | ICD-10-CM

## 2022-02-22 NOTE — ED Triage Notes (Signed)
Patient states he might have stepped wrong on his right ankle when he got out of bed this morning.  Patient c/o right ankle pain.

## 2022-02-22 NOTE — ED Provider Notes (Signed)
[facility]    CSN: [id number] Arrival date & time: 02/22/22  1839      History   Chief Complaint Chief Complaint  Patient presents with   Ankle Pain    right    HPI Jason Davidson is a 41 y.o. male presenting for right ankle pain and swelling since this morning.  Patient says being out of bed he thinks he twisted his ankle.  He denies fall.  He has taken ibuprofen which has helped.  He reports a lot of pain when he tries to bear weight on the right foot.  He denies any numbness, weakness or tingling.  He has no history of previous fracture or major injury of this foot or ankle.  He has no other injuries or complaints.  HPI  Past Medical History:  Diagnosis Date   Asthma    Depressed    Diabetes mellitus without complication (HCC)    IBS (irritable bowel syndrome)    Morbid obesity (HCC)     Patient Active Problem List   Diagnosis Date Noted   Acquired hypothyroidism 01/09/2021   Acute non-recurrent pansinusitis 07/19/2020   Hidradenitis suppurativa 04/29/2018   Allergic rhinitis 12/23/2017   Varicose veins of both lower extremities with pain 10/16/2017   IBS (irritable bowel syndrome) 06/18/2017   Chronic venous insufficiency 12/31/2016   Lymphedema 12/31/2016   Leg pain 12/31/2016   Diabetes (HCC) 12/31/2016   GERD (gastroesophageal reflux disease) 12/31/2016   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   H/O retinal detachment 10/01/2016   No diabetic retinopathy in both eyes 10/01/2016   Cellulitis of right lower extremity 06/19/2016   Chronic joint pain 11/16/2015   Skin infection 05/15/2015   Depressed    Tick bite 10/20/2014   Primary insomnia 07/08/2014   Candidal balanitis 02/16/2014   Skin tag 01/17/2014   Left shoulder pain 10/04/2013   Anxiety 09/13/2013   Diarrhea 09/13/2013   Bleeding external hemorrhoids 09/03/2013   NAFLD (nonalcoholic fatty liver disease) 78/29/5621   Headache 07/05/2013   Abdominal  pain 03/05/2013   Morbid (severe) obesity due to excess calories (HCC) 03/05/2013   Suicidal ideation 02/09/2013   Obstructive sleep apnea (adult) (pediatric) 07/21/2012   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 Authorizing Provider  tadalafil (CIALIS) 5 MG tablet Take by mouth. 02/14/22  Yes [provider]  testosterone cypionate (DEPOTESTOSTERONE CYPIONATE) 200 MG/ML injection Inject into the muscle. 02/14/22  Yes [provider]  Adalimumab 40 MG/0.4ML PNKT Inject into the skin. 09/10/18   [provider]  AIMOVIG 70 MG/ML SOAJ  07/21/17   [provider]  albuterol (VENTOLIN HFA) 108 (90 Base) MCG/ACT inhaler Inhale 1-2 puffs into the lungs every 6 (six) hours as needed for wheezing or shortness of breath. 04/06/20   Becky Augusta, NP  ALPRAZolam Prudy Feeler) 0.5 MG tablet Take 0.5 mg by mouth at bedtime as needed for anxiety.    [provider]  azelastine (ASTELIN) 0.1 % nasal spray Place into the nose.    [provider]  Colchicine 0.6 MG CAPS Take 1.2 mg on Day 1 of flare, followed by 0.6 mg one hour later. Day 2 take 0.6 mg BID until flare resolves. 08/01/17   [provider]  cyclobenzaprine (FLEXERIL)  10 MG tablet Take 1 tablet (10 mg total) by mouth 3 (three) times daily as needed. 01/31/18   Joni Reining, PA-C  dicyclomine (BENTYL) 10 MG capsule Take 10 mg by mouth 3 (three) times daily before meals.    [provider]  diphenoxylate-atropine (LOMOTIL) 2.5-0.025 MG tablet Take by mouth. 01/05/18   [provider]  DULoxetine (CYMBALTA) 60 MG capsule Take 120 mg by mouth daily.     [provider]  famotidine (PEPCID) 20 MG tablet Take by mouth. 10/15/18 10/15/19  [provider]  fluticasone (FLONASE) 50 MCG/ACT nasal spray  Place 1 spray into both nostrils 2 (two) times daily. 01/03/21   [provider]  glipiZIDE (GLUCOTROL) 10 MG tablet Take 10 mg by mouth daily before breakfast.    [provider]  glucose blood test strip Use 1 strip 3 times a day with meter 06/26/15   [provider]  HUMALOG 100 UNIT/ML injection SMARTSIG:120 Unit(s) SUB-Q Daily 03/01/20   [provider]  hydrochlorothiazide (HYDRODIURIL) 12.5 MG tablet Take 12.5 mg by mouth daily. 02/05/21   [provider]  hydrocortisone (ANUSOL-HC) 2.5 % rectal cream Place rectally. 12/16/16   [provider]  hydrOXYzine (ATARAX/VISTARIL) 25 MG tablet Take 25 mg by mouth 4 (four) times daily as needed. 11/17/20   [provider]  Insulin Syringe-Needle U-100 31G X 5/16" 1 ML MISC Use as directed with the Lantus Insulin nightly 08/26/17   [provider]  ketoconazole (NIZORAL) 2 % cream Apply topically daily. 01/10/21   [provider]  levothyroxine (SYNTHROID) 50 MCG tablet Take by mouth. 10/05/18 10/05/19  [provider]  lidocaine (XYLOCAINE) 2 % solution Use as directed 15 mLs in the mouth or throat as needed for mouth pain. 06/14/21   Mickie Bail, NP  loperamide (IMODIUM) 2 MG capsule Take by mouth.    [provider]  montelukast (SINGULAIR) 10 MG tablet Take by mouth. 03/31/17 03/03/20  [provider]  ondansetron (ZOFRAN-ODT) 4 MG disintegrating tablet Take 4 mg by mouth every 8 (eight) hours as needed. 01/09/21   [provider]  oxybutynin (DITROPAN) 5 MG tablet Take 5 mg by mouth 2 (two) times daily. 01/09/21   [provider]  oxybutynin (DITROPAN) 5 MG tablet Take 1 tablet by mouth 2 (two) times daily. 06/06/21   [provider]  pantoprazole (PROTONIX) 40 MG tablet Take 40 mg by mouth daily.    [provider]  polyethylene glycol powder (GLYCOLAX/MIRALAX) powder take 17GM (DISSOLVED IN WATER) by mouth once daily  07/25/17   [provider]  predniSONE (DELTASONE) 50 MG tablet One qd 05/28/21   Rodriguez-Southworth, Nettie Elm, PA-C  PROAIR HFA 108 (90 Base) MCG/ACT inhaler 1-2 puffs every 4 (four) hours as needed. Last used: [id number] 07/21/17   [provider]  promethazine (PHENERGAN) 25 MG tablet Take 25-50 mg by mouth at bedtime as needed. 01/09/21   [provider]  propranolol (INNOPRAN XL) 120 MG 24 hr capsule Take 120 mg by mouth at bedtime.    [provider]  ramelteon (ROZEREM) 8 MG tablet Take 8 mg by mouth at bedtime. 06/22/21   [provider]  sodium fluoride (FLUORISHIELD) 1.1 % GEL dental gel  01/29/21   [provider]  topiramate (TOPAMAX) 50 MG tablet Take 50 mg by mouth 2 (two) times daily.    [provider]    Family History Family History  Problem Relation Age of Onset  Diabetes Mother    Depression Mother    Diabetes Sister    Diabetes Brother    Diabetes Maternal Uncle    Diabetes Maternal Grandmother    Heart disease Maternal Grandmother    Cancer Maternal Grandfather    COPD Paternal Grandmother    Cancer Paternal Grandfather    Varicose Veins Paternal Grandfather    Healthy Father     Social History Social History   Tobacco Use   Smoking status: Never   Smokeless tobacco: Never  Vaping Use   Vaping Use: Never used  Substance Use Topics   Alcohol use: No   Drug use: No     Allergies   Erythromycin, Penicillins, and Sulfa antibiotics   Review of Systems Review of Systems  Musculoskeletal:  Positive for arthralgias, gait problem and joint swelling.  Skin:  Negative for color change and wound.  Neurological:  Negative for weakness and numbness.     Physical Exam Triage Vital Signs ED Triage Vitals  Enc Vitals Group     BP 02/22/22 1848 118/61     Pulse Rate 02/22/22 1848 71     Resp 02/22/22 1848 16     Temp 02/22/22 1848 98.3 F (36.8 C)     Temp Source 02/22/22 1848 Oral     SpO2  02/22/22 1848 98 %     Weight 02/22/22 1847 (!) 477 lb (216.4 kg)     Height 02/22/22 1847 5\' 11"  (1.803 m)     Head Circumference --      Peak Flow --      Pain Score 02/22/22 1846 8     Pain Loc --      Pain Edu? --      Excl. in GC? --    No data found.  Updated Vital Signs BP 118/61 (BP Location: Left Arm)   Pulse 71   Temp 98.3 F (36.8 C) (Oral)   Resp 16   Ht 5\' 11"  (1.803 m)   Wt (!) 477 lb (216.4 kg)   SpO2 98%   BMI 66.53 kg/m   Physical Exam Vitals and nursing note reviewed.  Constitutional:      General: He is not in acute distress.    Appearance: Normal appearance. He is well-developed. He is obese. He is not ill-appearing.  HENT:     Head: Normocephalic and atraumatic.  Eyes:     General: No scleral icterus.    Conjunctiva/sclera: Conjunctivae normal.  Cardiovascular:     Rate and Rhythm: Normal rate.     Pulses: Normal pulses.  Pulmonary:     Effort: Pulmonary effort is normal. No respiratory distress.  Musculoskeletal:     Cervical back: Neck supple.     Right ankle: Swelling (moderate lateral swelling) present. Tenderness present over the lateral malleolus and ATF ligament. Decreased range of motion. Normal pulse.  Skin:    General: Skin is warm and dry.     Capillary Refill: Capillary refill takes less than 2 seconds.  Neurological:     General: No focal deficit present.     Mental Status: He is alert. Mental status is at baseline.     Motor: No weakness.     Gait: Gait abnormal.  Psychiatric:        Mood and Affect: Mood normal.        Behavior: Behavior normal.      UC Treatments / Results  Labs (all labs ordered are listed, but only abnormal results are displayed)  Labs Reviewed - No data to display  EKG   Radiology DG Ankle Complete Right  Result Date: 02/22/2022 CLINICAL DATA:  Pain EXAM: RIGHT ANKLE - COMPLETE 3+ VIEW COMPARISON:  None Available. FINDINGS: No fracture or dislocation is seen. There are no opaque foreign bodies.  There is soft tissue swelling around the ankle. IMPRESSION: No recent fracture or dislocation is seen in right ankle. Electronically Signed   By: Ernie Avena M.D.   On: 02/22/2022 19:20    Procedures Procedures (including critical care time)  Medications Ordered in UC Medications - No data to display  Initial Impression / Assessment and Plan / UC Course  I have reviewed the triage vital signs and the nursing notes.  Pertinent labs & imaging results that were available during my care of the patient were reviewed by me and considered in my medical decision making (see chart for details).   40 year old male presenting for right ankle pain and swelling that started this morning when he twisted his ankle getting out of bed.  He has moderate swelling of the lateral ankle and tenderness over the lateral malleolus and ATFL.  Reduced range of motion of ankle.  Antalgic gait.  X-ray performed today shows no fracture or dislocation.  Discussed result with patient.  Suspect ankle sprain.  Patient already has a soft ankle brace.  Advised to continue to use this.  Reviewed RICE guidelines.  Advised continue Motrin and Tylenol.  Advised to follow-up as needed.   Final Clinical Impressions(s) / UC Diagnoses   Final diagnoses:  Sprain of right ankle, unspecified ligament, initial encounter  Acute right ankle pain     Discharge Instructions      SPRAIN: Stressed avoiding painful activities . Reviewed RICE guidelines. Use medications as directed, including NSAIDs. If no NSAIDs have been prescribed for you today, you may take Aleve or Motrin over the counter. May use Tylenol in between doses of NSAIDs.  If no improvement in the next 1-2 weeks, f/u with PCP or return to our office for reexamination, and please feel free to call or return at any time for any questions or concerns you may have and we will be happy to help you!         ED Prescriptions   None    PDMP not reviewed this  encounter.   Shirlee Latch, PA-C 02/22/22 1937

## 2022-02-22 NOTE — Discharge Instructions (Signed)
SPRAIN: Stressed avoiding painful activities . Reviewed RICE guidelines. Use medications as directed, including NSAIDs. If no NSAIDs have been prescribed for you today, you may take Aleve or Motrin over the counter. May use Tylenol in between doses of NSAIDs.  If no improvement in the next 1-2 weeks, f/u with PCP or return to our office for reexamination, and please feel free to call or return at any time for any questions or concerns you may have and we will be happy to help you!     

## 2022-02-22 NOTE — Unmapped (Signed)
[facility] Refill Coordination Note    Specialty Medication(s) to be Shipped:   Inflammatory Disorders: Humira     Other medication(s) to be shipped: No additional medications requested for fill at this time     Logan Quinn, DOB: Jan 05, 1981  Phone: [phone] (home)     All above HIPAA information was verified with patient.     Was a Nurse, learning disability used for this call? No    Completed refill call assessment today to schedule patient's medication shipment from the [facility] [phone]).  All relevant notes have been reviewed.     Specialty medication(s) and dose(s) confirmed: Regimen is correct and unchanged.   Changes to medications: [facility] reports no changes at this time.  Changes to insurance: No  New side effects reported not previously addressed with a pharmacist or physician: None reported  Questions for the pharmacist: No    Confirmed patient received a Conservation officer, historic buildings and a Surveyor, mining with first shipment. The patient will receive a drug information handout for each medication shipped and additional FDA Medication Guides as required.       DISEASE/MEDICATION-SPECIFIC INFORMATION        For patients on injectable medications: Patient currently has 1 doses left.  Next injection is scheduled for 02/21/2022.    SPECIALTY MEDICATION ADHERENCE     Medication Adherence    Patient reported X missed doses in the last month: 0  Specialty Medication: Humira  Patient is on additional specialty medications: No  Any gaps in refill history greater than 2 weeks in the last 3 months: no  Demonstrates understanding of importance of adherence: yes  Informant: patient  Reliability of informant: reliable              Confirmed plan for next specialty medication refill: delivery by pharmacy  Refills needed for supportive medications: not needed        Were doses missed due to medication being on hold? No    Humira CF 80mg /0.43ml Inj: 7 days of medicine on hand REFERRAL TO PHARMACIST     Referral to the pharmacist: Not needed    [facility]     Shipping address confirmed in Epic.     Delivery Scheduled: Yes, Expected medication delivery date: 02/26/2022.     Medication will be delivered via Same Day Courier to the prescription address in Epic WAM.    Valere Dross   [facility] Specialty Technician

## 2022-02-23 DIAGNOSIS — E1165 Type 2 diabetes mellitus with hyperglycemia: Principal | ICD-10-CM

## 2022-02-23 DIAGNOSIS — Z794 Long term (current) use of insulin: Principal | ICD-10-CM

## 2022-02-23 MED ORDER — HUMALOG U-100 INSULIN 100 UNIT/ML SUBCUTANEOUS SOLUTION
2 refills | 0.00000 days
Start: 2022-02-23 — End: ?

## 2022-02-25 MED ORDER — HUMALOG U-100 INSULIN 100 UNIT/ML SUBCUTANEOUS SOLUTION
5 refills | 0.00000 days | Status: CP
Start: 2022-02-25 — End: 2023-02-26

## 2022-02-25 NOTE — Unmapped (Signed)
Patient sprained ankle on Thursday and went to UC on Friday. States they recommended a brace, ice, elevation and ibuprofen. Has followed these directions but pain at night is keeping him from sleeping.    No available appointments until Wednesday and patient declined.    Please call patient to assist.

## 2022-02-26 MED FILL — HUMIRA(CF) PEN 80 MG/0.8 ML SUBCUTANEOUS KIT: SUBCUTANEOUS | 28 days supply | Qty: 4 | Fill #7

## 2022-02-26 NOTE — Unmapped (Signed)
Scheduled with Dennard Nip 9/20.

## 2022-02-27 ENCOUNTER — Ambulatory Visit: Admit: 2022-02-27 | Discharge: 2022-02-28 | Payer: MEDICAID | Attending: Family | Primary: Family

## 2022-02-27 DIAGNOSIS — S93401S Sprain of unspecified ligament of right ankle, sequela: Principal | ICD-10-CM

## 2022-02-27 MED ORDER — HYDROXYZINE HCL 25 MG TABLET
ORAL_TABLET | Freq: Three times a day (TID) | ORAL | 1 refills | 40 days | Status: CP | PRN
Start: 2022-02-27 — End: ?

## 2022-02-27 MED ORDER — DICLOFENAC SODIUM 50 MG TABLET,DELAYED RELEASE
ORAL_TABLET | Freq: Two times a day (BID) | ORAL | 1 refills | 15.00000 days | Status: CP
Start: 2022-02-27 — End: 2023-02-27

## 2022-02-27 NOTE — Unmapped (Signed)
[facility] at [facility] Note:  Logan Quinn, [place] [address]. Phone [phone]    02/27/2022    Patient Name:   Logan Quinn    MRN: [id number]    Demographics:    Age-  41 y.o.     Date of Birth-  [birth year]    Chief complaint (CC):    Chief Complaint   Patient presents with    Ankle Pain     Right ankle pain since spraining last Thursday evening       Assessment/Plan:    Logan Quinn is a pleasant 41 y.o. male with PMHx of NAFLD, GERD, asthma, T2DM, IBS, Morbid obesity, hypothyroidism, OSA on CPAP, HTN, anxiety, depression, SI, insomnia, ED, chronic joint pain. Here for evaluation regarding persistent right ankle pain x6 days following twisting injury while climbing out of truck. UC workup reassuring 02/22/22, xray with no fracture or dislocation. Symptoms were attributed to ankle sprain, agree presentation today remains consistent with this diagnosis. PE notable for bilateral ankle swelling and tenderness, ROM appears intact. Patient wearing right ankle brace during majority of appointment. Patient reports lack of benefit with: RICE method, cyclobenzaprine 10 mg TID prn, OTC Ibuprofen or Tylenol. Offered Rx for tizanidine, patient declines citing previous severe intolerance (unable to find record of intolerance per review of EMR, though I do see where patient was prescribed medication for several months in 2020). Patient specifically requests Rx for Tylenol #3, noting he found some benefit with this in the distant past. Reviewed that unfortunately I do not recommend opiate therapy for management of ankle sprain, furthermore patient's frequent use of benzodiazepines (alprazolam 0.5 mg BID prn) contraindicates use of opiates. Ultimately suggested patient begin trial of diclofenac 50 mg bid prn, potential benefits vs risks were reviewed and Rx sent to preferred pharmacy. Encouraged patient to continue use of ankle brace, RICE method. Offered Physical Therapy referral, patient declines. Reviewed that it may take 8+ weeks for symptoms of sprain to improve however if symptoms persist I would likely suggest Orthopedics consult, patient indicates understanding. ER precautions reviewed, follow up prn.      Diagnosis ICD-10-CM Associated Orders   1. Sprain of right ankle, unspecified ligament, sequela  S93.401S           30 minutes of clinical time > 1/2 the office visit was face to face time. We discussed medical, dietary, lifestyle, and health maintenance modifications to optimize health. Standard precautions followed during visit. Medication adherence and barriers to the treatment plan have been addressed.  Patient voiced understanding, needs F/U visit as scheduled.    Health Maintenance:   Health Maintenance Due   Topic Date Due    COVID-19 Vaccine (3 - Pfizer risk series) 01/24/2021     Subjective:    History of present illness (HPI):       Logan Quinn is a 41 y.o. male who presented to [facility] for evaluation regarding right ankle pain.     Patient sought evaluation at [facility] UC 02/22/22 for x1 day of persistent right ankle pain and swelling sine twisting ankle while rising from bed earlier that AM (patient disputes today, expresses he was stepping out of truck and twisted his ankle). PE with moderate swelling and reduced ROM, xray negative for signs of fracture or dislocation. Symptoms attributed to ankle sprain and he was encouraged to begin use of soft ankle brace, Motrin and Tylenol prn.     Today patient reports constant 7/10 right ankle pain since initial  injury. Pain has impacted sleep quality. He has reportedly found no benefit with RICE method, Tylenol, Ibuprofen, cyclobenzaprine. Recalls finding benefit with Tylenol #3 in the past, also expresses he was not able to tolerate tizanidine previously.     Patient's medical history was reviewed, see Past Medical History. Medications used in the past were reviewed, see medications. Patient reports eating and eliminating well. Pt is sleeping well. Patient has not required emergency room treatment for these symptoms, and has not required hospitalization.     Denies HA, fever, chest pain, shortness of breath, N/V/D, bowel or bladder issues, vision or hearing changes, or swelling.     Relevant ROS: Reviewed 12 systems, positive findings listed, all others negative.    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)     Obstructive sleep apnea     OSA on CPAP     Retinal detachment 04/2014    Severe obstructive sleep apnea     Trapezius muscle strain 12/07/2013    Urinary incontinence, nocturnal enuresis     Venous insufficiency        Medications:       Current Outpatient Medications:     acetaminophen (TYLENOL) 500 MG tablet, Take 2 tablets (1,000 mg total) by mouth Three (3) times a day., Disp: 30 tablet, Rfl: 0    adalimumab (HUMIRA,CF, PEN) 80 mg/0.8 mL PnKt, Inject the contents of 1 pen (80mg ) under the skin once a week., Disp: 4 each, Rfl: 11    albuterol 2.5 mg /3 mL (0.083 %) nebulizer solution, Inhale 3 mL (2.5 mg total) by nebulization every four (4) hours as needed for wheezing., Disp: 180 mL, Rfl: 2    albuterol HFA 90 mcg/actuation inhaler, Inhale 2 puffs every six (6) hours as needed for wheezing., Disp: 8 g, Rfl: 5    ALPRAZolam (XANAX) 0.5 MG tablet, Takes 1 tablet nightly and 1 tablet a day when needed, Disp: , Rfl:     ascorbic acid (VITAMIN C ORAL), Take 1 capsule by mouth nightly., Disp: , Rfl:     budesonide-formoteroL (SYMBICORT) 80-4.5 mcg/actuation inhaler, Inhale 2 puffs Two (2) times a day., Disp: 10.2 g, Rfl: 11    chlorhexidine (PERIDEX) 0.12 % solution, , Disp: , Rfl:     clindamycin (CLEOCIN T) 1 % external solution, clindamycin phosphate 1 % topical solution  APPLY TOPICALLY TO GROIN AREA TWICE A DAY, Disp: , Rfl:     clindamycin (CLEOCIN T) 1 % lotion, APPLY TOPICALLY AT NIGHT TO INFLAMED AREAS AS NEEDED FOR FLARES, Disp: , Rfl:     clobetasoL (TEMOVATE) 0.05 % Gel, , Disp: , Rfl:     clobetasoL (TEMOVATE) 0.05 % ointment, Apply daily to painful affected areas if needed for flares, then stop, Disp: 15 g, Rfl: 1  colchicine (MITIGARE) 0.6 mg cap capsule, TAKE 2 CAPSULES BY MOUTH ON DAY ONE, FOLLOWED BY ONE CAPSULE ONE HOUR LATER, ON DAY 2 TAKE ONE CAPSULE BY MOUTH TWICEA DAY UNTIL RESOLVED, Disp: 60 capsule, Rfl: 0    cyclobenzaprine (FLEXERIL) 10 MG tablet, Take 1 tablet (10 mg total) by mouth Three (3) times a day as needed for muscle spasms., Disp: 60 tablet, Rfl: 1    dextroamphetamine sulfate (DEXTROSTAT) 10 MG tablet, Take 2 tablets (20 mg total) by mouth two (2) times a day., Disp: , Rfl:     diphenoxylate-atropine (LOMOTIL) 2.5-0.025 mg per tablet, Take 1 tablet by mouth two (2) times a day as needed for diarrhea., Disp: 30 tablet, Rfl: 5    dulaglutide (TRULICITY) 3 mg/0.5 mL injection pen, Inject 0.5 mL (3 mg total) under the skin every seven (7) days., Disp: 2 mL, Rfl: 2    DULoxetine (CYMBALTA) 60 MG capsule, Take 1 capsule (60 mg total) by mouth Two (2) times a day., Disp: 180 capsule, Rfl: 0    erenumab-aooe (AIMOVIG AUTOINJECTOR) 140 mg/mL AtIn, Inject 140 mg under the skin every thirty (30) days., Disp: 3 mL, Rfl: 1    famotidine (PEPCID) 20 MG tablet, Take 1 tablet (20 mg total) by mouth Two (2) times a day., Disp: 180 tablet, Rfl: 1    fluticasone propionate (FLONASE) 50 mcg/actuation nasal spray, 1 spray into each nostril Two (2) times a day., Disp: 16 g, Rfl: 5    glipiZIDE (GLUCOTROL) 10 MG tablet, Take 2 tablets (20 mg total) by mouth Two (2) times a day (30 minutes before a meal)., Disp: 360 tablet, Rfl: 1    HUMALOG U-100 INSULIN 100 unit/mL injection, INJECT 120 UNITS UNDER SKIN DAILY VIA OMNIPOD, Disp: 40 mL, Rfl: 5    hydroCHLOROthiazide (HYDRODIURIL) 25 MG tablet, Take 1 tablet (25 mg total) by mouth daily., Disp: 90 tablet, Rfl: 3    hydrocortisone (PROCTOSOL HC) 2.5 % rectal cream, Insert 1 application into the rectum two (2) times a day as needed., Disp: 28.35 g, Rfl: 2    ibuprofen (ADVIL,MOTRIN) 800 MG tablet, Take 1 tablet (800 mg total) by mouth every eight (8) hours as needed., Disp: 90 tablet, Rfl: 1    insulin glargine (LANTUS) 100 unit/mL injection, Inject 0.05 mL (5 Units total) under the skin nightly., Disp: 10 mL, Rfl: 3    insulin pump cart,automated,BT (OMNIPOD 5 G6 PODS, GEN 5,) Crtg, Change POD every 48 hours., Disp: 3 each, Rfl: 11    ketoconazole (NIZORAL) 2 % cream, , Disp: , Rfl:     levothyroxine (SYNTHROID) 50 MCG tablet, Take 1 tablet (50 mcg total) by mouth daily., Disp: 90 tablet, Rfl: 1    montelukast (SINGULAIR) 10 mg tablet, Take 1 tablet (10 mg total) by mouth daily., Disp: 90 tablet, Rfl: 3    multivitamin (MULTIPLE VITAMIN ORAL), Take 1 tablet by mouth daily., Disp: , Rfl:     nystatin (MYCOSTATIN) 100,000 unit/gram powder, , Disp: , Rfl:     ondansetron (ZOFRAN) 4 MG tablet, Take 1 tablet (4 mg total) by mouth every eight (8) hours as needed for nausea., Disp: 30 tablet, Rfl: 1    oxybutynin (DITROPAN) 5 MG tablet, Take 1 tablet (5 mg total) by mouth Two (2) times a day., Disp: 180 tablet, Rfl: 1    pantoprazole (PROTONIX) 40 MG tablet, Take 1 tablet (40 mg total) by mouth Two (2) times a day., Disp: 180 tablet, Rfl: 1  polyethylene glycol (GLYCOLAX) 17 gram/dose powder, , Disp: , Rfl:     promethazine (PHENERGAN) 25 MG tablet, Take 1 tablet (25 mg total) by mouth every eight (8) hours as needed for nausea., Disp: 30 tablet, Rfl: 1    propranoloL (INDERAL LA) 120 mg 24 hr capsule, Take 1 capsule (120 mg total) by mouth daily., Disp: 90 capsule, Rfl: 1    ramelteon (ROZEREM) 8 mg tablet, Take 1 tablet (8 mg total) by mouth nightly., Disp: , Rfl:     sour cherry extract (TART CHERRY EXTRACT) 1,000 mg cap, , Disp: , Rfl:     tadalafiL (CIALIS) 5 MG tablet, Take 1 tablet (5 mg total) by mouth in the morning., Disp: 90 tablet, Rfl: 3    testosterone cypionate (DEPOTESTOTERONE CYPIONATE) 200 mg/mL injection, Inject 0.5 mL (100 mg total) into the muscle once a week. Inject 0.5cc (100mg ) weekly, Disp: 2 mL, Rfl: 5    topiramate (TOPAMAX) 50 MG tablet, Take 1.5 tablets (75 mg total) by mouth Two (2) times a day., Disp: 270 tablet, Rfl: 1    blood sugar diagnostic (ACCU-CHEK GUIDE TEST STRIPS) Strp, by Other route Three (3) times a day before meals. (Patient not taking: Reported on 02/27/2022), Disp: 300 each, Rfl: 1    blood-glucose meter kit, Use as directed (Patient not taking: Reported on 02/27/2022), Disp: 1 each, Rfl: 0    diclofenac (VOLTAREN) 50 MG EC tablet, Take 1 tablet (50 mg total) by mouth Two (2) times a day. As needed for pain., Disp: 30 tablet, Rfl: 1    empty container Misc, Use as directed to dispose of needles. When full, make sure lid is closed tightly then dispose of container in trash., Disp: 1 each, Rfl: 2    fluoride, sodium, 1.1 % Pste, , Disp: , Rfl:     hydrOXYzine (ATARAX) 25 MG tablet, Take 1 tablet (25 mg total) by mouth Three (3) times a day as needed. May take two before bed for sleep., Disp: 120 tablet, Rfl: 1    insulin syringe-needle U-100 1 mL 31 gauge x 5/16 (8 mm) Syrg, Once daily for insulin dosing., Disp: 100 each, Rfl: 0    lancets (ACCU-CHEK FASTCLIX LANCET DRUM) Misc, Use to check blood sugars 3 times a day before meals or as directed, Disp: 200 each, Rfl: 5    lidocaine (XYLOCAINE) 5 % ointment, lidocaine 5 % topical ointment, Disp: , Rfl:     safety needles (BD SAFETYGLIDE NEEDLE) 23 gauge x 1 Ndle, 1 Units by Miscellaneous route once a week., Disp: 12 each, Rfl: 11    safety needles 18 gauge x 1 1/2 Ndle, 1 Units by Miscellaneous route once a week., Disp: 12 each, Rfl: 11     Allergies:   Allergies   Allergen Reactions    Erythromycin Nausea And Vomiting     Other reaction(s): Vomiting    Penicillins Rash, Swelling and Hives    Sulfa (Sulfonamide Antibiotics) Nausea And Vomiting and Nausea Only    Other        Pertinent Social Hx and Habits: EMR reviewed    Pertinent Family Hx: EMR reviewed    Objective:      BP Readings from Last 3 Encounters:   02/27/22 133/84   02/19/22 114/74   02/14/22 132/70        Vitals:    02/27/22 1324   BP: 133/84   BP Site: R Wrist   BP Position: Sitting   Pulse: 60  SpO2: 97%   Weight: (!) 216.8 kg (478 lb)   Height: 180.3 cm (5' 11)        Physical Exam:    General Appearance: WDWN in NAD      Skin: W, D, I  HEENT: PERRLA, EOMI, TM's clear  Respiratory: Clear throughout  Cardio: RRR  Abdomen: Soft and non-tnder  Neurologic/MSK: A & O X 4, Grossly intact. Bilateral non-pitting ankle swelling noted. Favoring left side despite ambulating with cane.   PSYCH: Behavior calm and cooperative    Diagnostics:     Lab Results   Component Value Date    WBC 7.3 01/15/2022    HGB 14.3 01/15/2022    HCT 42.6 01/15/2022    PLT 187 01/15/2022       Lab Results   Component Value Date    NA 140 01/15/2022    K 3.6 01/15/2022    CL 103 01/15/2022    CO2 29.9 01/15/2022    BUN 9 01/15/2022    CREATININE 0.92 01/15/2022    GLU 145 (H) 01/15/2022    CALCIUM 8.6 (L) 01/15/2022    MG 1.8 12/07/2012    PHOS 4.1 12/07/2012       Lab Results   Component Value Date    BILITOT 1.2 01/15/2022    BILIDIR 0.3 12/07/2012    PROT 6.8 01/15/2022    ALBUMIN 3.2 (L) 01/15/2022    ALT 62 (H) 01/15/2022    AST 29 01/15/2022    ALKPHOS 69 01/15/2022       Lab Results   Component Value Date    PT 11.8 01/18/2021    INR 1.01 01/18/2021    APTT 24.7 (L) 01/18/2021        TSH   Date Value Ref Range Status   01/15/2022 2.690 0.550 - 4.780 uIU/mL Final   01/09/2021 1.637 0.550 - 4.780 uIU/mL Final        I attest that I, Benita Stabile, personally documented this note while acting as scribe for Olena Leatherwood, NP.      Benita Stabile, Scribe.  02/27/2022     The documentation recorded by the scribe accurately reflects the service I personally performed and the decisions made by me.     Olena Leatherwood, NP    Dr. Betha Loa, DNP, FNP-BC  [facility] at [facility] Certified Doctor of Nursing Practice   [phone]

## 2022-03-07 ENCOUNTER — Encounter (INDEPENDENT_AMBULATORY_CARE_PROVIDER_SITE_OTHER): Payer: Self-pay | Admitting: Nurse Practitioner

## 2022-03-07 DIAGNOSIS — G43909 Migraine, unspecified, not intractable, without status migrainosus: Principal | ICD-10-CM

## 2022-03-07 MED ORDER — AIMOVIG AUTOINJECTOR 140 MG/ML SUBCUTANEOUS AUTO-INJECTOR
SUBCUTANEOUS | 1 refills | 0 days | Status: CP
Start: 2022-03-07 — End: 2023-03-07

## 2022-03-07 NOTE — Progress Notes (Signed)
Subjective:    Patient ID: Jason Davidson, male    DOB: [birth year], 41 y.o.   MRN: [id number] No chief complaint on file.   Patient returns today for follow-up regarding his varicose veins and lymphedema.  The patient recently underwent several rounds of sclerotherapy on his right lower extremity due to a painful varicosity.  That has resolved.  He also notes that his swelling has been doing good as well.    Review of Systems  Cardiovascular:  Positive for leg swelling.  All other systems reviewed and are negative.      Objective:   Physical Exam Vitals reviewed.  Constitutional:      Appearance: He is obese.  HENT:     Head: Normocephalic.  Cardiovascular:     Rate and Rhythm: Normal rate.     Pulses: Normal pulses.  Pulmonary:     Effort: Pulmonary effort is normal.  Skin:    General: Skin is warm and dry.  Neurological:     Mental Status: He is alert and oriented to person, place, and time.  Psychiatric:        Mood and Affect: Mood normal.        Behavior: Behavior normal.        Thought Content: Thought content normal.        Judgment: Judgment normal.     BP (!) 157/86 (BP Location: Right Arm)   Pulse 76   Resp 18   Ht 5\' 11"  (1.803 m)   Wt (!) 488 lb 6.4 oz (221.5 kg)   BMI 68.12 kg/m   Past Medical History:  Diagnosis Date   Asthma    Depressed    Diabetes mellitus without complication (HCC)    IBS (irritable bowel syndrome)    Morbid obesity (HCC)     Social History   Socioeconomic History   Marital status: Married    Spouse name: Not on file   Number of children: Not on file   Years of education: Not on file   Highest education level: Not on file  Occupational History   Not on file  Tobacco Use   Smoking status: Never   Smokeless tobacco: Never  Vaping Use   Vaping Use: Never used  Substance and Sexual Activity   Alcohol use: No   Drug use: No   Sexual activity: Not on file  Other Topics Concern   Not on file  Social  History Narrative   Not on file   Social Determinants of Health   Financial Resource Strain: Not on file  Food Insecurity: Not on file  Transportation Needs: Not on file  Physical Activity: Not on file  Stress: Not on file  Social Connections: Not on file  Intimate Partner Violence: Not on file    Past Surgical History:  Procedure Laterality Date   EYE SURGERY      Family History  Problem Relation Age of Onset   Diabetes Mother    Depression Mother    Diabetes Sister    Diabetes Brother    Diabetes Maternal Uncle    Diabetes Maternal Grandmother    Heart disease Maternal Grandmother    Cancer Maternal Grandfather    COPD Paternal Grandmother    Cancer Paternal Grandfather    Varicose Veins Paternal Grandfather    Healthy Father     Allergies  Allergen Reactions   Erythromycin Nausea And Vomiting   Penicillins Swelling   Sulfa Antibiotics Nausea And Vomiting  Latest Ref Rng & Units 02/14/2014    4:37 AM 12/24/2013   12:40 AM 11/30/2013    1:42 AM  CBC  WBC 3.8 - 10.6 x10 3/mm 3 8.0  13.0  11.0   Hemoglobin 13.0 - 18.0 g/dL 14.8  15.4  13.7   Hematocrit 40.0 - 52.0 % 43.4  46.6  42.0   Platelets 150 - 440 x10 3/mm 3 175  184  174       CMP     Component Value Date/Time   NA 137 02/14/2014 0437   K 3.8 02/14/2014 0437   CL 101 02/14/2014 0437   CO2 26 02/14/2014 0437   GLUCOSE 307 (H) 02/14/2014 0437   BUN 12 02/14/2014 0437   CREATININE 1.01 02/14/2014 0437   CALCIUM 8.4 (L) 02/14/2014 0437   PROT 7.4 02/14/2014 0437   ALBUMIN 3.5 02/14/2014 0437   AST 62 (H) 02/14/2014 0437   ALT 76 (H) 02/14/2014 0437   ALKPHOS 91 02/14/2014 0437   BILITOT 0.9 02/14/2014 0437   GFRNONAA >60 02/14/2014 0437   GFRAA >60 02/14/2014 0437     No results found.     Assessment & Plan:   1. Varicose veins of both lower extremities with pain Patient had 3 rounds of sclerotherapy and he notes that the pain and discomfort he felt previously has resolved.   Patient will continue with current conservative therapy as noted below.  2. Lymphedema Today the patient's lymphedema is under good control.  He is advised to continue with conservative therapy including use of compression socks, elevation and activity.  Patient also notes that he has begun a weight loss journey and this to also his lymphedema.  We will follow-up with the patient on an annual basis.    Current Outpatient Medications on File Prior to Visit  Medication Sig Dispense Refill   AIMOVIG 70 MG/ML SOAJ   0   albuterol (VENTOLIN HFA) 108 (90 Base) MCG/ACT inhaler Inhale 1-2 puffs into the lungs every 6 (six) hours as needed for wheezing or shortness of breath. 18 g 0   ALPRAZolam (XANAX) 0.5 MG tablet Take 0.5 mg by mouth at bedtime as needed for anxiety.     azelastine (ASTELIN) 0.1 % nasal spray Place into the nose.     Colchicine 0.6 MG CAPS Take 1.2 mg on Day 1 of flare, followed by 0.6 mg one hour later. Day 2 take 0.6 mg BID until flare resolves.     cyclobenzaprine (FLEXERIL) 10 MG tablet Take 1 tablet (10 mg total) by mouth 3 (three) times daily as needed. 15 tablet 0   dicyclomine (BENTYL) 10 MG capsule Take 10 mg by mouth 3 (three) times daily before meals.     diphenoxylate-atropine (LOMOTIL) 2.5-0.025 MG tablet Take by mouth.     DULoxetine (CYMBALTA) 60 MG capsule Take 120 mg by mouth daily.      fluticasone (FLONASE) 50 MCG/ACT nasal spray Place 1 spray into both nostrils 2 (two) times daily.     glipiZIDE (GLUCOTROL) 10 MG tablet Take 10 mg by mouth daily before breakfast.     glucose blood test strip Use 1 strip 3 times a day with meter     HUMALOG 100 UNIT/ML injection SMARTSIG:120 Unit(s) SUB-Q Daily     hydrochlorothiazide (HYDRODIURIL) 12.5 MG tablet Take 12.5 mg by mouth daily.     hydrocortisone (ANUSOL-HC) 2.5 % rectal cream Place rectally.     hydrOXYzine (ATARAX/VISTARIL) 25 MG tablet Take 25 mg by  mouth 4 (four) times daily as needed.     Insulin  Syringe-Needle U-100 31G X 5/16" 1 ML MISC Use as directed with the Lantus Insulin nightly     ketoconazole (NIZORAL) 2 % cream Apply topically daily.     lidocaine (XYLOCAINE) 2 % solution Use as directed 15 mLs in the mouth or throat as needed for mouth pain. 100 mL 0   loperamide (IMODIUM) 2 MG capsule Take by mouth.     ondansetron (ZOFRAN-ODT) 4 MG disintegrating tablet Take 4 mg by mouth every 8 (eight) hours as needed.     oxybutynin (DITROPAN) 5 MG tablet Take 5 mg by mouth 2 (two) times daily.     oxybutynin (DITROPAN) 5 MG tablet Take 1 tablet by mouth 2 (two) times daily.     pantoprazole (PROTONIX) 40 MG tablet Take 40 mg by mouth daily.     polyethylene glycol powder (GLYCOLAX/MIRALAX) powder take 17GM (DISSOLVED IN WATER) by mouth once daily  0   predniSONE (DELTASONE) 50 MG tablet One qd 5 tablet 0   PROAIR HFA 108 (90 Base) MCG/ACT inhaler 1-2 puffs every 4 (four) hours as needed. Last used: [id number]  0   promethazine (PHENERGAN) 25 MG tablet Take 25-50 mg by mouth at bedtime as needed.     propranolol (INNOPRAN XL) 120 MG 24 hr capsule Take 120 mg by mouth at bedtime.     ramelteon (ROZEREM) 8 MG tablet Take 8 mg by mouth at bedtime.     sodium fluoride (FLUORISHIELD) 1.1 % GEL dental gel      topiramate (TOPAMAX) 50 MG tablet Take 50 mg by mouth 2 (two) times daily.     Adalimumab 40 MG/0.4ML PNKT Inject into the skin.     famotidine (PEPCID) 20 MG tablet Take by mouth.     levothyroxine (SYNTHROID) 50 MCG tablet Take by mouth.     montelukast (SINGULAIR) 10 MG tablet Take by mouth.     No current facility-administered medications on file prior to visit.    There are no Patient Instructions on file for this visit. No follow-ups on file.   Georgiana Spinner, NP

## 2022-03-07 NOTE — Progress Notes (Incomplete)
Subjective:    Patient ID: Jason Davidson, male    DOB: 02-15-1981, 41 y.o.   MRN: [id number] No chief complaint on file.   Patient returns today for follow-up regarding his varicose veins and lymphedema.  The patient recently underwent several rounds of sclerotherapy on his right lower extremity due to a painful varicosity.  That has resolved.  He also notes that his swelling has been doing good as well.    Review of Systems     Objective:   Physical Exam  BP (!) 157/86 (BP Location: Right Arm)   Pulse 76   Resp 18   Ht 5\' 11"  (1.803 m)   Wt (!) 488 lb 6.4 oz (221.5 kg)   BMI 68.12 kg/m   Past Medical History:  Diagnosis Date  . Asthma   . Depressed   . Diabetes mellitus without complication (HCC)   . IBS (irritable bowel syndrome)   . Morbid obesity (HCC)     Social History   Socioeconomic History  . Marital status: Married    Spouse name: Not on file  . Number of children: Not on file  . Years of education: Not on file  . Highest education level: Not on file  Occupational History  . Not on file  Tobacco Use  . Smoking status: Never  . Smokeless tobacco: Never  Vaping Use  . Vaping Use: Never used  Substance and Sexual Activity  . Alcohol use: No  . Drug use: No  . Sexual activity: Not on file  Other Topics Concern  . Not on file  Social History Narrative  . Not on file   Social Determinants of Health   Financial Resource Strain: Not on file  Food Insecurity: Not on file  Transportation Needs: Not on file  Physical Activity: Not on file  Stress: Not on file  Social Connections: Not on file  Intimate Partner Violence: Not on file    Past Surgical History:  Procedure Laterality Date  . EYE SURGERY      Family History  Problem Relation Age of Onset  . Diabetes Mother   . Depression Mother   . Diabetes Sister   . Diabetes Brother   . Diabetes Maternal Uncle   . Diabetes Maternal Grandmother   . Heart disease Maternal Grandmother    . Cancer Maternal Grandfather   . COPD Paternal Grandmother   . Cancer Paternal Grandfather   . Varicose Veins Paternal Grandfather   . Healthy Father     Allergies  Allergen Reactions  . Erythromycin Nausea And Vomiting  . Penicillins Swelling  . Sulfa Antibiotics Nausea And Vomiting       Latest Ref Rng & Units 02/14/2014    4:37 AM 12/24/2013   12:40 AM 11/30/2013    1:42 AM  CBC  WBC 3.8 - 10.6 x10 3/mm 3 8.0  13.0  11.0   Hemoglobin 13.0 - 18.0 g/dL 12/02/2013  02.7  25.3   Hematocrit 40.0 - 52.0 % 43.4  46.6  42.0   Platelets 150 - 440 x10 3/mm 3 175  184  174       CMP     Component Value Date/Time   NA 137 02/14/2014 0437   K 3.8 02/14/2014 0437   CL 101 02/14/2014 0437   CO2 26 02/14/2014 0437   GLUCOSE 307 (H) 02/14/2014 0437   BUN 12 02/14/2014 0437   CREATININE 1.01 02/14/2014 0437   CALCIUM 8.4 (L) 02/14/2014 04/16/2014  PROT 7.4 02/14/2014 0437   ALBUMIN 3.5 02/14/2014 0437   AST 62 (H) 02/14/2014 0437   ALT 76 (H) 02/14/2014 0437   ALKPHOS 91 02/14/2014 0437   BILITOT 0.9 02/14/2014 0437   GFRNONAA >60 02/14/2014 0437   GFRAA >60 02/14/2014 0437     No results found.     Assessment & Plan:   1. Varicose veins of both lower extremities with pain Patient had 3 rounds of sclerotherapy and he notes that the pain and discomfort he felt previously has resolved.  Patient will continue with current conservative therapy as noted below.  2. Lymphedema Today the patient's lymphedema is under good control.  He is advised to continue with conservative therapy including use of compression socks, elevation and activity.  Patient also notes that he has begun a weight loss journey and this to also his lymphedema.  We will follow-up with the patient on an annual basis.    Current Outpatient Medications on File Prior to Visit  Medication Sig Dispense Refill  . AIMOVIG 70 MG/ML SOAJ   0  . albuterol (VENTOLIN HFA) 108 (90 Base) MCG/ACT inhaler Inhale 1-2 puffs into the  lungs every 6 (six) hours as needed for wheezing or shortness of breath. 18 g 0  . ALPRAZolam (XANAX) 0.5 MG tablet Take 0.5 mg by mouth at bedtime as needed for anxiety.    Marland Kitchen azelastine (ASTELIN) 0.1 % nasal spray Place into the nose.    . Colchicine 0.6 MG CAPS Take 1.2 mg on Day 1 of flare, followed by 0.6 mg one hour later. Day 2 take 0.6 mg BID until flare resolves.    . cyclobenzaprine (FLEXERIL) 10 MG tablet Take 1 tablet (10 mg total) by mouth 3 (three) times daily as needed. 15 tablet 0  . dicyclomine (BENTYL) 10 MG capsule Take 10 mg by mouth 3 (three) times daily before meals.    . diphenoxylate-atropine (LOMOTIL) 2.5-0.025 MG tablet Take by mouth.    . DULoxetine (CYMBALTA) 60 MG capsule Take 120 mg by mouth daily.     . fluticasone (FLONASE) 50 MCG/ACT nasal spray Place 1 spray into both nostrils 2 (two) times daily.    Marland Kitchen glipiZIDE (GLUCOTROL) 10 MG tablet Take 10 mg by mouth daily before breakfast.    . glucose blood test strip Use 1 strip 3 times a day with meter    . HUMALOG 100 UNIT/ML injection SMARTSIG:120 Unit(s) SUB-Q Daily    . hydrochlorothiazide (HYDRODIURIL) 12.5 MG tablet Take 12.5 mg by mouth daily.    . hydrocortisone (ANUSOL-HC) 2.5 % rectal cream Place rectally.    . hydrOXYzine (ATARAX/VISTARIL) 25 MG tablet Take 25 mg by mouth 4 (four) times daily as needed.    . Insulin Syringe-Needle U-100 31G X 5/16" 1 ML MISC Use as directed with the Lantus Insulin nightly    . ketoconazole (NIZORAL) 2 % cream Apply topically daily.    Marland Kitchen lidocaine (XYLOCAINE) 2 % solution Use as directed 15 mLs in the mouth or throat as needed for mouth pain. 100 mL 0  . loperamide (IMODIUM) 2 MG capsule Take by mouth.    . ondansetron (ZOFRAN-ODT) 4 MG disintegrating tablet Take 4 mg by mouth every 8 (eight) hours as needed.    Marland Kitchen oxybutynin (DITROPAN) 5 MG tablet Take 5 mg by mouth 2 (two) times daily.    Marland Kitchen oxybutynin (DITROPAN) 5 MG tablet Take 1 tablet by mouth 2 (two) times daily.    .  pantoprazole (PROTONIX)  40 MG tablet Take 40 mg by mouth daily.    . polyethylene glycol powder (GLYCOLAX/MIRALAX) powder take 17GM (DISSOLVED IN WATER) by mouth once daily  0  . predniSONE (DELTASONE) 50 MG tablet One qd 5 tablet 0  . PROAIR HFA 108 (90 Base) MCG/ACT inhaler 1-2 puffs every 4 (four) hours as needed. Last used: [id number]  0  . promethazine (PHENERGAN) 25 MG tablet Take 25-50 mg by mouth at bedtime as needed.    . propranolol (INNOPRAN XL) 120 MG 24 hr capsule Take 120 mg by mouth at bedtime.    . ramelteon (ROZEREM) 8 MG tablet Take 8 mg by mouth at bedtime.    . sodium fluoride (FLUORISHIELD) 1.1 % GEL dental gel     . topiramate (TOPAMAX) 50 MG tablet Take 50 mg by mouth 2 (two) times daily.    . Adalimumab 40 MG/0.4ML PNKT Inject into the skin.    . famotidine (PEPCID) 20 MG tablet Take by mouth.    . levothyroxine (SYNTHROID) 50 MCG tablet Take by mouth.    . montelukast (SINGULAIR) 10 MG tablet Take by mouth.     No current facility-administered medications on file prior to visit.    There are no Patient Instructions on file for this visit. No follow-ups on file.   Georgiana Spinner, NP

## 2022-03-07 NOTE — Unmapped (Signed)
Patient is requesting the following refill  Requested Prescriptions     Pending Prescriptions Disp Refills    erenumab-aooe (AIMOVIG AUTOINJECTOR) 140 mg/mL AtIn [Pharmacy Med Name: AIMOVIG 140 MG/ML AUTOINJ[R]] 3 mL 1     Sig: Inject 1 Pen under the skin every thirty (30) days.       Recent Visits  Date Type Provider Dept   02/27/22 Office Visit Desmond Dike, NP [place] [facility] [address] [facility]   02/19/22 Office Visit Loran Senters, FNP [place] [facility] [address] [facility]   01/02/22 Office Visit Loran Senters, FNP [place] [facility] [address] [facility]   11/28/21 Office Visit Loran Senters, FNP [place] [facility] [address] [facility]   10/10/21 Office Visit Loran Senters, FNP [place] [facility] [address] [facility]   09/11/21 Office Visit Loran Senters, FNP [place] [facility] [address] [facility]   07/13/21 Office Visit Loran Senters, FNP [place] [facility]   04/12/21 Office Visit Loran Senters, FNP [place] [facility]   Showing recent visits within past 365 days with a meds authorizing provider and meeting all other requirements  Future Appointments  Date Type Provider Dept   04/10/22 Appointment Loran Senters, FNP  [facility] [address] [facility]   Showing future appointments within next 365 days with a meds authorizing provider and meeting all other requirements       Labs: Not applicable this refill

## 2022-03-13 ENCOUNTER — Ambulatory Visit
Admit: 2022-03-13 | Discharge: 2022-03-14 | Payer: MEDICAID | Attending: Physician Assistant | Primary: Physician Assistant

## 2022-03-13 DIAGNOSIS — Z6841 Body Mass Index (BMI) 40.0 and over, adult: Principal | ICD-10-CM

## 2022-03-13 NOTE — Unmapped (Signed)
Obesity Medicine summary at the initial visit on 01/15/2022   Logan Quinn Logan Quinn is a 41 y.o. male and Body mass index is 66.97 kg/m??..  He presents with obesity contributing to multiple co morbidities and affecting her ability to be active.  His target weight is 150 lbs .  His weight gain history is suggestive of an early onset obesity and progressive weight gain. Additional causes of his obesity include metabolic effects of consumption of processed food, irregular eating patterns, circadian disruption, and suboptimal physical activity  Co-morbidities at this time include Type II Diabetes Mellitus, GERD, Depression, Anxiety, OSA, Hidradenitis Suppurativa, Hypertension, Nonalcoholic Fatty Liver Disease, Asthma, Hypothyroidism, IBS, Chronic pansinusitis, Chronic joint pain, Migraines, Venous Insufficiency, Neuropathy in diabetes, Gout.      Treatment course  Starting weight at presentation:487.6 lbs  Weight today:480.1 lbs   Weight loss since last visit about 8 weeks ago: 7.5 lbs  Total weight loss since presentation:7.5 lbs   Percent weight loss since presentation:1.5%  Percent weight loss with lifestyle modifications and trulicity: 1.5%    Previous use of anti-obesity medications:      12/04/2021     4:13 PM   Weight Loss Medication History   Have you ever used any of the medications from this list? Metformin    Topiramate    Trulicity (dulaglutide)   Enter the amount of weight loss and side-effects for any medications selected in the previous question 10-15       Narrative history  ??No c/o   ?  Diet quality:  Has not been snacking between meals. Trying the mediterranean diet    Overview of typical 24 hr recall:   8 am - egg - boiled or scrambled, toast  Snack -  skip   12 pm - beef hamburger, carrots  2 pm - broccoli  8 pm - [employer] roast beef no bread, nacho cheese   10 pm -  broccoli      Beverages: water, occassionally or when dining out - sweet tea, Diet Dr Reino Kent last night   Alcohol: none     Dietary patterns and associated behaviors: fairly consistent meal pattern. May have broccoli at night.  Denies stress/emotional, bored, or binge eating.   ?  ?Exercise: Walk 15 minutes 2-3 times a week. States he gets wore out fast or has leg swelling.   ??  Sleep: He has a h/o Sleep apnea and compliant with CPAP. Duration: 6 hours. He feels in the morning.   ??  Stress: Managing well.   ???  Medications: Trulicity prescribed by PCP. He has been able to eat less food.      Logan Quinn has Diabetes type 2, uncontrolled; Lesion of radial nerve; GERD (gastroesophageal reflux disease) (RAF-HCC); Depression (RAF-HCC); Suicidal ideation; Abdominal pain; Headache; NAFLD (nonalcoholic fatty liver disease); Bleeding external hemorrhoids; Anxiety; Diarrhea; Obesity; Type 2 diabetes mellitus with hyperglycemia (CMS-HCC); Left shoulder pain; Trapezius muscle strain; Primary insomnia; Chronic joint pain; Cellulitis of right lower extremity; No diabetic retinopathy in both eyes; Congenital hypertrophy of retinal pigment epithelium; H/O retinal detachment; IBS (irritable bowel syndrome); Urinary incontinence, nocturnal enuresis; Severe obstructive sleep apnea; OSA on CPAP; Hidradenitis suppurativa; Acute non-recurrent pansinusitis; Acquired hypothyroidism; Essential hypertension; Mild intermittent asthma without complication; Chronic pansinusitis; Environmental and seasonal allergies; Erectile dysfunction; Sinus congestion; Nasal congestion; and Cough variant asthma on their problem list.     has a current medication list which includes the following prescription(s): acetaminophen, humira(cf) pen, albuterol, albuterol, alprazolam, ascorbic acid,  accu-chek guide test strips, blood-glucose meter, budesonide-formoterol, chlorhexidine, clindamycin, clindamycin, clobetasol, clobetasol, colchicine (gout), cyclobenzaprine, dextroamphetamine sulfate, diclofenac, diphenoxylate-atropine, dulaglutide, duloxetine, empty container, aimovig autoinjector, famotidine, fluoride (sodium), fluticasone propionate, glipizide, humalog u-100 insulin, hydrochlorothiazide, hydrocortisone, hydroxyzine, ibuprofen, insulin glargine, omnipod 5 g6 pods (gen 5), insulin syringe-needle u-100, ketoconazole, lancets, levothyroxine, lidocaine, montelukast, multivitamin, nystatin, ondansetron, oxybutynin, pantoprazole, polyethylene glycol, promethazine, propranolol, ramelteon, bd safetyglide needle, safety needles, tart cherry extract, tadalafil, testosterone cypionate, and topiramate.     is allergic to erythromycin, penicillins, sulfa (sulfonamide antibiotics), and other.      reports that he has never smoked. He has never been exposed to tobacco smoke. He has never used smokeless tobacco. He reports that he does not drink alcohol and does not use drugs.       Vital Signs  Ht 180.3 cm (5' 11)  - Wt (!) 217.8 kg (480 lb 3.2 oz)  - BMI 66.97 kg/m??    BMI:  (!) 67      Exam:  AAO x3  Respiratory:No labored breathing.       Plan at initial evaluation on 01/15/2022     1. Based on my assessment of the possible etiologies for the patient's obesity, medical co-morbidities, and review of systems my recommendations include the following:     Diet quality:   Diet is inadequate in lean protein, inadequate in vegetables, moderate in processed foods and low in SSB.     Patient was encouraged to focus on consuming lean protein, a variety of non starchy vegetables and fruit with each meal and snacks. Wean off carbonated and sugar sweetened beverages. Avoid consuming processed and fast foods; and avoid refined carbohydrates ( such as white bread, biscuits, white pasta, white rice, grits, crackers).      Dietary patterns and associated behaviors:  Meal pattern is inconsistent. Patient tends to skip meals.     We discussed the importance of having a  consistent meal pattern with three meals per day and 1-3 snacks. Avoid skipping meals or grazing. Avoid going longer than 4 hours without eating.     Patient asked to schedule meals and snacks every 3 hours.     Recommended Premier Protein, Ensure Max, Boost Max and Fair Life Core shakes as a meal replacement to avoid skipping meals. Or choose a protein shake with less than 200 calories, at least 20 grams of protein, less than 10 grams of total carbohydrates and 5 or less of total fat.             Exercise: PA not adequate for age/risk factors      We discussed scheduling exercise at least 5-10 minutes once a week then gradually add another day every 2-3 weeks as tolerated. Next try to increase exercise duration by a few minutes each week until you get to 30 minutes most days of the week.     Then add resistance training by alternating between upper and lower body resistance exercises using bands or free weights 2-3 times a week.     Recommended walking, [employer] chair exercises on [email or web address] or Kellie Shropshire walk at home videos to get started on a routine.            Sleep: Patient has a h/o OSA and is compliant with CPAP therapy. Good sleep hygiene.      Encouraged healthy sleep habits/sleep hygiene such as going to bed when sleepy, trying to get between 7-8 hours per night,  going to bed around the same time  nightly and waking around the same time each morning.   Avoid screen time at least one hour before bed.  Avoid napping during the day.   Wear CPAP/BIPAP nightly.   Avoid drinking at least 2 hours before bed. If your mouth is dry, try small sips to moisten your mouth.   Try Yoga Nidra on YouTube.com (the lady in the blue shirt) to help with improving sleep, relaxation, releasing tension and stress.            Stress management: Stress level is currently moderate, feels that it is manageable, and no worrisome sx such as SI/HI, self harm, or worsening of previous psych diagnosis           Weight gain causing medications:Gabapentin and Duloxetine.          Anti Obesity Medications: A few anti-obesity medications may be used for this patient including metformin, topiramate, wellbutrin, GLP1 agonists or SGLT2 inhibitors pending insurance coverage.        Obesity Surgery: Patient is interested in surgery and meets criteria for bariatric surgery.   Patient requires a supervised diet and will refer to the bariatric intake clinic after completion of supervised diet visit.  He will need to lose weight down to 450 lbs before bariatric intake.     ?   Labwork ordered: CBC, CMP, HgbA1C, TSH, Vitamin B12, Vitamin D, Folate, Ferritin, Iron Panel, Lipid panel, Tox screen and Nicotine screen.       Plan on 03/13/2022  He is responding well with Trulicity and has consistent weight loss with Trulicity. Recommended patient continue Trulicity as prescribed.     We discussed increasing walking and be consistent with at least three days a week for 15 minutes.     Continue to wean down on sweetened and carbonated beverages.     Start consuming lean protein first with meals, then non starchy vegetables and carbs last.     Also start practicing not drinking with meal.    Follow up in 2 months.     Visit Time:   I personally spent 30 minutes face-to-face and non-face-to-face in the care of this patient, which includes all pre, intra, and post visit time on the date of service.  All documented time was specific to the E/M visit and does not include any procedures that may have been performed.

## 2022-03-13 NOTE — Unmapped (Signed)
We discussed increasing walking and be consistent with at least three days a week for 15 minutes.     Continue to wean down on sweetened and carbonated beverages.     Start consuming lean protein first with meals, then non starchy vegetables and carbs last.     Also start practicing not drinking with meal.

## 2022-03-15 ENCOUNTER — Encounter: Payer: Self-pay | Admitting: Emergency Medicine

## 2022-03-15 ENCOUNTER — Ambulatory Visit
Admission: EM | Admit: 2022-03-15 | Discharge: 2022-03-15 | Disposition: A | Discharge status: Home / Self Care | Payer: 59 | Attending: Family Medicine | Admitting: Family Medicine

## 2022-03-15 DIAGNOSIS — R300 Dysuria: Secondary | ICD-10-CM | POA: Diagnosis present

## 2022-03-15 DIAGNOSIS — Z7251 High risk heterosexual behavior: Secondary | ICD-10-CM | POA: Diagnosis present

## 2022-03-15 LAB — URINALYSIS, MICROSCOPIC (REFLEX)
RBC / HPF: NONE SEEN RBC/hpf (ref 0–5)
Squamous Epithelial / HPF: NONE SEEN (ref 0–5)

## 2022-03-15 LAB — URINALYSIS, ROUTINE W REFLEX MICROSCOPIC
Bilirubin Urine: NEGATIVE
Glucose, UA: NEGATIVE mg/dL
Hgb urine dipstick: NEGATIVE
Ketones, ur: NEGATIVE mg/dL
Nitrite: NEGATIVE
Protein, ur: NEGATIVE mg/dL
Specific Gravity, Urine: 1.02 (ref 1.005–1.030)
pH: 7 (ref 5.0–8.0)

## 2022-03-15 LAB — HIV ANTIBODY (ROUTINE TESTING W REFLEX): HIV Screen 4th Generation wRfx: NONREACTIVE

## 2022-03-15 NOTE — ED Triage Notes (Signed)
Patient c/o burning when urinating and urinary frequency that started 2 weeks ago.  Patient reports some penile discharge 2 weeks ago.

## 2022-03-15 NOTE — ED Provider Notes (Signed)
[facility]    CSN: [id number] Arrival date & time: 03/15/22  1314      History   Chief Complaint Chief Complaint  Patient presents with   Dysuria    HPI  HPI  Jason Davidson is a 41 y.o. male. PT reports painful urination and urethral discharge for the past 2 weeks. His PCP couldn't see him so he came here. His wife started noticing vaginal discharge. He had similar dysuria when he had tear in the urethra. Follows with urology for overactive bladder which is controlled and sees another one for low testosterone..  He has never had discharge before. He is a diabetic and home blood sugars 200s. Last a1c 7.9.   Pt here for STD screening.  Denies known STI exposure.   Attempted to have poly-relationship. They added a new male partner.The new partners screening was negative. Zephan does not use condoms with the new partner. Denies fever, chills, abdominal pain, rash, sore in mouth, nausea or vomiting.  Endorses hematuria when it first started. Has urinary frequency and urgency. Feels like that is getting better but is still present. Has genital itching that is worse at nighttime. Has bilateral lower back pain. No history of kidney stones.     Past Medical History:  Diagnosis Date   Asthma    Depressed    Diabetes mellitus without complication (HCC)    IBS (irritable bowel syndrome)    Morbid obesity (HCC)     Patient Active Problem List   Diagnosis Date Noted   Acquired hypothyroidism 01/09/2021   Acute non-recurrent pansinusitis 07/19/2020   Hidradenitis suppurativa 04/29/2018   Allergic rhinitis 12/23/2017   Varicose veins of both lower extremities with pain 10/16/2017   IBS (irritable bowel syndrome) 06/18/2017   Chronic venous insufficiency 12/31/2016   Lymphedema 12/31/2016   Leg pain 12/31/2016   Diabetes (HCC) 12/31/2016   GERD (gastroesophageal reflux disease) 12/31/2016   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   H/O  retinal detachment 10/01/2016   No diabetic retinopathy in both eyes 10/01/2016   Cellulitis of right lower extremity 06/19/2016   Chronic joint pain 11/16/2015   Skin infection 05/15/2015   Depressed    Tick bite 10/20/2014   Primary insomnia 07/08/2014   Candidal balanitis 02/16/2014   Skin tag 01/17/2014   Left shoulder pain 10/04/2013   Anxiety 09/13/2013   Diarrhea 09/13/2013   Bleeding external hemorrhoids 09/03/2013   NAFLD (nonalcoholic fatty liver disease) 95/62/1308   Headache 07/05/2013   Abdominal pain 03/05/2013   Morbid (severe) obesity due to excess calories (HCC) 03/05/2013   Suicidal ideation 02/09/2013   Obstructive sleep apnea (adult) (pediatric) 07/21/2012   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 Authorizing Provider  Adalimumab 40 MG/0.4ML PNKT Inject into the skin. 09/10/18   [provider]  AIMOVIG 70 MG/ML SOAJ  07/21/17   [provider]  albuterol (VENTOLIN HFA) 108 (90 Base) MCG/ACT inhaler Inhale 1-2 puffs into the lungs every 6 (six) hours as needed for wheezing or shortness of breath. 04/06/20   Becky Augusta, NP  ALPRAZolam Prudy Feeler) 0.5 MG tablet Take 0.5 mg by mouth at bedtime as needed for anxiety.    [provider]  azelastine (ASTELIN) 0.1 % nasal spray Place into the  nose.    [provider]  Colchicine 0.6 MG CAPS Take 1.2 mg on Day 1 of flare, followed by 0.6 mg one hour later. Day 2 take 0.6 mg BID until flare resolves. 08/01/17   [provider]  cyclobenzaprine (FLEXERIL) 10 MG tablet Take 1 tablet (10 mg total) by mouth 3 (three) times daily as needed. 01/31/18   Sable Feil, PA-C  dicyclomine (BENTYL) 10 MG capsule Take 10 mg by mouth 3 (three) times daily before meals.    [provider]  diphenoxylate-atropine  (LOMOTIL) 2.5-0.025 MG tablet Take by mouth. 01/05/18   [provider]  DULoxetine (CYMBALTA) 60 MG capsule Take 120 mg by mouth daily.     [provider]  famotidine (PEPCID) 20 MG tablet Take by mouth. 10/15/18 10/15/19  [provider]  fluticasone (FLONASE) 50 MCG/ACT nasal spray Place 1 spray into both nostrils 2 (two) times daily. 01/03/21   [provider]  glipiZIDE (GLUCOTROL) 10 MG tablet Take 10 mg by mouth daily before breakfast.    [provider]  glucose blood test strip Use 1 strip 3 times a day with meter 06/26/15   [provider]  HUMALOG 100 UNIT/ML injection SMARTSIG:120 Unit(s) SUB-Q Daily 03/01/20   [provider]  hydrochlorothiazide (HYDRODIURIL) 12.5 MG tablet Take 12.5 mg by mouth daily. 02/05/21   [provider]  hydrocortisone (ANUSOL-HC) 2.5 % rectal cream Place rectally. 12/16/16   [provider]  hydrOXYzine (ATARAX/VISTARIL) 25 MG tablet Take 25 mg by mouth 4 (four) times daily as needed. 11/17/20   [provider]  Insulin Syringe-Needle U-100 31G X 5/16" 1 ML MISC Use as directed with the Lantus Insulin nightly 08/26/17   [provider]  ketoconazole (NIZORAL) 2 % cream Apply topically daily. 01/10/21   [provider]  levothyroxine (SYNTHROID) 50 MCG tablet Take by mouth. 10/05/18 10/05/19  [provider]  lidocaine (XYLOCAINE) 2 % solution Use as directed 15 mLs in the mouth or throat as needed for mouth pain. 06/14/21   Sharion Balloon, NP  loperamide (IMODIUM) 2 MG capsule Take by mouth.    [provider]  montelukast (SINGULAIR) 10 MG tablet Take by mouth. 03/31/17 03/03/20  [provider]  ondansetron (ZOFRAN-ODT) 4 MG disintegrating tablet Take 4 mg by mouth every 8 (eight) hours as needed. 01/09/21   [provider]  oxybutynin (DITROPAN) 5 MG tablet Take 5 mg by mouth 2 (two) times daily. 01/09/21   [provider]   oxybutynin (DITROPAN) 5 MG tablet Take 1 tablet by mouth 2 (two) times daily. 06/06/21   [provider]  pantoprazole (PROTONIX) 40 MG tablet Take 40 mg by mouth daily.    [provider]  polyethylene glycol powder (GLYCOLAX/MIRALAX) powder take 17GM (DISSOLVED IN WATER) by mouth once daily 07/25/17   [provider]  predniSONE (DELTASONE) 50 MG tablet One qd 05/28/21   Rodriguez-Southworth, Sunday Spillers, PA-C  PROAIR HFA 108 (90 Base) MCG/ACT inhaler 1-2 puffs every 4 (four) hours as needed. Last used: [id number] 07/21/17   [provider]  promethazine (PHENERGAN) 25 MG tablet Take 25-50 mg by mouth at bedtime as needed. 01/09/21   [provider]  propranolol (INNOPRAN XL) 120 MG 24 hr capsule Take 120 mg by mouth at bedtime.    [provider]  ramelteon (ROZEREM) 8 MG tablet Take 8 mg by mouth at bedtime. 06/22/21   [provider]  sodium fluoride (FLUORISHIELD) 1.1 %  GEL dental gel  01/29/21   [provider]  tadalafil (CIALIS) 5 MG tablet Take by mouth. 02/14/22   [provider]  testosterone cypionate (DEPOTESTOSTERONE CYPIONATE) 200 MG/ML injection Inject into the muscle. 02/14/22   [provider]  topiramate (TOPAMAX) 50 MG tablet Take 50 mg by mouth 2 (two) times daily.    [provider]    Family History Family History  Problem Relation Age of Onset   Diabetes Mother    Depression Mother    Diabetes Sister    Diabetes Brother    Diabetes Maternal Uncle    Diabetes Maternal Grandmother    Heart disease Maternal Grandmother    Cancer Maternal Grandfather    COPD Paternal Grandmother    Cancer Paternal Grandfather    Varicose Veins Paternal Grandfather    Healthy Father     Social History Social History   Tobacco Use   Smoking status: Never   Smokeless tobacco: Never  Vaping Use   Vaping Use: Never used  Substance Use Topics   Alcohol use: No   Drug use: No     Allergies    Erythromycin, Penicillins, and Sulfa antibiotics   Review of Systems Review of Systems: negative unless otherwise stated in HPI.      Physical Exam Triage Vital Signs ED Triage Vitals  Enc Vitals Group     BP 03/15/22 1347 (!) 147/84     Pulse Rate 03/15/22 1347 86     Resp 03/15/22 1347 15     Temp 03/15/22 1347 98.3 F (36.8 C)     Temp Source 03/15/22 1347 Oral     SpO2 03/15/22 1347 95 %     Weight 03/15/22 1345 (!) 477 lb 1.2 oz (216.4 kg)     Height 03/15/22 1345 5\' 11"  (1.803 m)     Head Circumference --      Peak Flow --      Pain Score 03/15/22 1344 4     Pain Loc --      Pain Edu? --      Excl. in [place]? --    No data found.  Updated Vital Signs BP (!) 147/84 (BP Location: Left Arm)   Pulse 86   Temp 98.3 F (36.8 C) (Oral)   Resp 15   Ht 5\' 11"  (1.803 m)   Wt (!) 216.4 kg   SpO2 95%   BMI 66.54 kg/m   Visual Acuity Right Eye Distance:   Left Eye Distance:   Bilateral Distance:    Right Eye Near:   Left Eye Near:    Bilateral Near:     Physical Exam GEN: well appearing obese male in no acute distress  CVS: well perfused, regular rate   RESP: speaking in full sentences without pause, no respiratory distress ABD: Large abdominal pannus GU: Buried penis, urethral discharge    UC Treatments / Results  Labs (all labs ordered are listed, but only abnormal results are displayed) Labs Reviewed  URINALYSIS, ROUTINE W REFLEX MICROSCOPIC - Abnormal; Notable for the following components:      Result Value   Leukocytes,Ua TRACE (*)    All other components within normal limits  URINALYSIS, MICROSCOPIC (REFLEX) - Abnormal; Notable for the following components:   Bacteria, UA FEW (*)    All other components within normal limits  URINE CULTURE  HIV ANTIBODY (ROUTINE TESTING W REFLEX)  RPR  CYTOLOGY, (ORAL, ANAL, URETHRAL) ANCILLARY ONLY    EKG   Radiology  No results found.  Procedures Procedures (including critical care time)  Medications  Ordered in UC Medications - No data to display  Initial Impression / Assessment and Plan / UC Course  I have reviewed the triage vital signs and the nursing notes.  Pertinent labs & imaging results that were available during my care of the patient were reviewed by me and considered in my medical decision making (see chart for details).         STI screening  High risk sexual behavior Patient is a 41 year old male who presents for dysuria and urethral discharge after having intercourse with a new partner. GC, chlamydia and trich  probe sent to lab. HIV and RPR collected.  Offered prophylactic treatment however patient declined.  Discussed PreP if he and his partner are wanting to have an open relationship/polygamy with multiple partners.  He states that he is going to start prep via an online service.  He follows with urology for overactive bladder and low testosterone.  Return precautions including abdominal pain, fever, chills, nausea, or vomiting given.   Discussed MDM, treatment plan and plan for follow-up with patient/parent who agrees with plan.     Final Clinical Impressions(s) / UC Diagnoses   Final diagnoses:  Dysuria  High risk sexual behavior, unspecified type     Discharge Instructions      Be sure to use condoms regularly.  Check your MyChart account for your results.  Someone will contact you if your test results are positive.  Go to ED for red flag symptoms, including; fevers you cannot reduce with Tylenol/Motrin, severe headaches, vision changes, numbness/weakness in part of the body, lethargy, confusion, intractable vomiting, severe dehydration, chest pain, breathing difficulty, severe persistent abdominal or pelvic pain, signs of severe infection (increased redness, swelling of an area), feeling faint or passing out, dizziness, etc. You should especially go to the ED for sudden acute worsening of condition if you do not elect to go at this time.       ED  Prescriptions   None    PDMP not reviewed this encounter.   Lyndee Hensen, DO 03/15/22 2005

## 2022-03-15 NOTE — Discharge Instructions (Signed)
Be sure to use condoms regularly.  Check your MyChart account for your results.  Someone will contact you if your test results are positive.  Go to ED for red flag symptoms, including; fevers you cannot reduce with Tylenol/Motrin, severe headaches, vision changes, numbness/weakness in part of the body, lethargy, confusion, intractable vomiting, severe dehydration, chest pain, breathing difficulty, severe persistent abdominal or pelvic pain, signs of severe infection (increased redness, swelling of an area), feeling faint or passing out, dizziness, etc. You should especially go to the ED for sudden acute worsening of condition if you do not elect to go at this time.

## 2022-03-15 NOTE — Unmapped (Signed)
[facility] Refill Coordination Note    Specialty Medication(s) to be Shipped:   Inflammatory Disorders: Humira     Other medication(s) to be shipped: [employer], DOB: [birth year]  Phone: [phone] (home)     All above HIPAA information was verified with patient.     Was a Nurse, learning disability used for this call? No    Completed refill call assessment today to schedule patient's medication shipment from the [facility] [phone]).  All relevant notes have been reviewed.     Specialty medication(s) and dose(s) confirmed: Regimen is correct and unchanged.   Changes to medications: Yanky reports no changes at this time.  Changes to insurance: No  New side effects reported not previously addressed with a pharmacist or physician: None reported  Questions for the pharmacist: No    Confirmed patient received a Conservation officer, historic buildings and a Surveyor, mining with first shipment. The patient will receive a drug information handout for each medication shipped and additional FDA Medication Guides as required.       DISEASE/MEDICATION-SPECIFIC INFORMATION        For patients on injectable medications: Patient currently has 1 doses left.  Next injection is scheduled for 10/12.    SPECIALTY MEDICATION ADHERENCE     Medication Adherence    Patient reported X missed doses in the last month: 0  Specialty Medication: Humira  Patient is on additional specialty medications: No  Any gaps in refill history greater than 2 weeks in the last 3 months: no  Demonstrates understanding of importance of adherence: yes  Informant: patient  Reliability of informant: reliable              Confirmed plan for next specialty medication refill: delivery by pharmacy  Refills needed for supportive medications: not needed        Were doses missed due to medication being on hold? No    Humira CF 80mg /0.57ml Inj: 7 days of medicine on hand     REFERRAL TO PHARMACIST     Referral to the pharmacist: Not needed    [facility]     Shipping address confirmed in Epic.     Delivery Scheduled: Yes, Expected medication delivery date: 10/11.     Medication will be delivered via Same Day Courier to the prescription address in Epic WAM.    Valere Dross   [facility] Specialty Technician

## 2022-03-16 LAB — RPR: RPR Ser Ql: NONREACTIVE

## 2022-03-16 LAB — URINE CULTURE: Culture: NO GROWTH

## 2022-03-18 ENCOUNTER — Ambulatory Visit (INDEPENDENT_AMBULATORY_CARE_PROVIDER_SITE_OTHER): Payer: 59 | Admitting: Podiatry

## 2022-03-18 ENCOUNTER — Encounter: Payer: Self-pay | Admitting: Podiatry

## 2022-03-18 DIAGNOSIS — E1142 Type 2 diabetes mellitus with diabetic polyneuropathy: Secondary | ICD-10-CM | POA: Diagnosis not present

## 2022-03-18 DIAGNOSIS — I872 Venous insufficiency (chronic) (peripheral): Secondary | ICD-10-CM | POA: Diagnosis not present

## 2022-03-18 DIAGNOSIS — B351 Tinea unguium: Secondary | ICD-10-CM

## 2022-03-18 DIAGNOSIS — M79676 Pain in unspecified toe(s): Secondary | ICD-10-CM | POA: Diagnosis not present

## 2022-03-18 DIAGNOSIS — I890 Lymphedema, not elsewhere classified: Secondary | ICD-10-CM | POA: Diagnosis not present

## 2022-03-18 DIAGNOSIS — B353 Tinea pedis: Secondary | ICD-10-CM

## 2022-03-18 LAB — CYTOLOGY, (ORAL, ANAL, URETHRAL) ANCILLARY ONLY
Chlamydia: NEGATIVE
Comment: NEGATIVE
Comment: NEGATIVE
Comment: NORMAL
Neisseria Gonorrhea: NEGATIVE
Trichomonas: POSITIVE — AB

## 2022-03-18 NOTE — Progress Notes (Signed)
This patient returns to my office for at risk foot care.  This patient requires this care by a professional since this patient will be at risk due to having diabetes with no complications.  This patient is unable to cut nails himself since the patient cannot reach his nails.These nails are painful walking and wearing shoes. Patient says he has itching present between his toes right foot.  This patient presents for at risk foot care today.  General Appearance  Alert, conversant and in no acute stress.  Vascular  Dorsalis pedis and posterior tibial  pulses are palpable  bilaterally.  Capillary return is within normal limits  bilaterally. Temperature is within normal limits  bilaterally.  Neurologic  Senn-Weinstein monofilament wire test within normal limits  bilaterally. Muscle power within normal limits bilaterally.  Nails Thick disfigured discolored nails with subungual debris  from hallux to fifth toes bilaterally. No evidence of bacterial infection or drainage bilaterally.  Orthopedic  No limitations of motion  feet .  No crepitus or effusions noted.  No bony pathology or digital deformities noted.  Skin  normotropic skin with no porokeratosis noted bilaterally.  No signs of infections or ulcers noted.   Peeling and redness between toes right foot.  Onychomycosis  Pain in right toes  Pain in left toes  Tinea pedis.  Consent was obtained for treatment procedures.   Mechanical debridement of nails 1-5  bilaterally performed with a nail nipper.  Filed with dremel without incident. Told patient to purchase Lamisil-AF.   Return office visit   4 months                   Told patient to return for periodic foot care and evaluation due to potential at risk complications.   Gardiner Barefoot DPM

## 2022-03-19 ENCOUNTER — Telehealth (HOSPITAL_COMMUNITY): Payer: Self-pay | Admitting: Emergency Medicine

## 2022-03-19 MED ORDER — METRONIDAZOLE 500 MG PO TABS: 2000.0000 mg | ORAL_TABLET | Freq: Once | ORAL | 0 refills | Status: AC

## 2022-03-20 MED FILL — HUMIRA(CF) PEN 80 MG/0.8 ML SUBCUTANEOUS KIT: SUBCUTANEOUS | 28 days supply | Qty: 4 | Fill #8

## 2022-03-20 MED FILL — EMPTY CONTAINER: 120 days supply | Qty: 1 | Fill #1

## 2022-03-28 DIAGNOSIS — Z6841 Body Mass Index (BMI) 40.0 and over, adult: Principal | ICD-10-CM

## 2022-03-28 DIAGNOSIS — E1165 Type 2 diabetes mellitus with hyperglycemia: Principal | ICD-10-CM

## 2022-03-28 DIAGNOSIS — Z794 Long term (current) use of insulin: Principal | ICD-10-CM

## 2022-03-28 MED ORDER — DULAGLUTIDE 3 MG/0.5 ML SUBCUTANEOUS PEN INJECTOR
SUBCUTANEOUS | 2 refills | 28 days | Status: CP
Start: 2022-03-28 — End: 2023-03-28

## 2022-03-28 NOTE — Unmapped (Signed)
Patient is requesting the following refill  Requested Prescriptions     Pending Prescriptions Disp Refills    dulaglutide (TRULICITY) 3 mg/0.5 mL injection pen 2 mL 2     Sig: Inject 0.5 mL (3 mg total) under the skin every seven (7) days.       Recent Visits  Date Type Provider Dept   02/27/22 Office Visit Desmond Dike, NP [place] [facility] [address] [facility]   02/19/22 Office Visit Loran Senters, FNP [place] [facility] [address] [facility]   01/02/22 Office Visit Loran Senters, FNP [place] [facility] [address] [facility]   11/28/21 Office Visit Loran Senters, FNP [place] [facility] [address] [facility]   10/10/21 Office Visit Loran Senters, FNP [place] [facility] [address] [facility]   09/11/21 Office Visit Loran Senters, FNP [place] [facility] [address] At [facility]   07/13/21 Office Visit Loran Senters, FNP [place] [facility]   04/12/21 Office Visit Loran Senters, FNP [place] [facility]   Showing recent visits within past 365 days with a meds authorizing provider and meeting all other requirements  Future Appointments  Date Type Provider Dept   04/10/22 Appointment Loran Senters, FNP [place] [facility] [address] [facility]   Showing future appointments within next 365 days with a meds authorizing provider and meeting all other requirements       Labs: A1c:   Hemoglobin A1C (%)   Date Value   01/15/2022 7.9 (H)   10/10/2021 7.3 (A)   09/15/2014 6.0

## 2022-03-31 MED ORDER — CYCLOBENZAPRINE 10 MG TABLET
ORAL_TABLET | Freq: Three times a day (TID) | ORAL | 1 refills | 20 days | PRN
Start: 2022-03-31 — End: 2023-03-31

## 2022-04-01 NOTE — Unmapped (Signed)
Patient is requesting the following refill  Requested Prescriptions     Pending Prescriptions Disp Refills    cyclobenzaprine (FLEXERIL) 10 MG tablet 60 tablet 1     Sig: Take 1 tablet (10 mg total) by mouth Three (3) times a day as needed for muscle spasms.       Recent Visits  Date Type Provider Dept   02/27/22 Office Visit Desmond Dike, NP [place] [facility] [address] [facility]   02/19/22 Office Visit Loran Senters, FNP [place] [facility] [address] [facility]   01/02/22 Office Visit Loran Senters, FNP [place] [facility] [address] [facility]   11/28/21 Office Visit Loran Senters, FNP [place] [facility] [address] [facility]   10/10/21 Office Visit Loran Senters, FNP [place] [facility] [address] [facility]   09/11/21 Office Visit Loran Senters, FNP [place] [facility] [address] [facility]   07/13/21 Office Visit Loran Senters, FNP [place] [facility]   04/12/21 Office Visit Loran Senters, FNP [place] [facility]   Showing recent visits within past 365 days with a meds authorizing provider and meeting all other requirements  Future Appointments  Date Type Provider Dept   04/10/22 Appointment Loran Senters, FNP  [facility] [address] At [facility]   Showing future appointments within next 365 days with a meds authorizing provider and meeting all other requirements       Labs: Not applicable this refill

## 2022-04-02 MED ORDER — CYCLOBENZAPRINE 10 MG TABLET
ORAL_TABLET | Freq: Three times a day (TID) | ORAL | 1 refills | 20 days | Status: CP | PRN
Start: 2022-04-02 — End: 2023-04-02

## 2022-04-02 MED ORDER — INSULIN SYRINGE U-100 WITH NEEDLE 1 ML 31 GAUGE X 5/16" (8 MM)
3 refills | 0 days | Status: CP
Start: 2022-04-02 — End: 2023-04-02

## 2022-04-02 NOTE — Unmapped (Signed)
Patient is requesting the following refill  Requested Prescriptions     Pending Prescriptions Disp Refills    insulin syringe-needle U-100 1 mL 31 gauge x 5/16 (8 mm) Syrg 100 each 0     Sig: Once daily for insulin dosing.       Recent Visits  Date Type Provider Dept   02/27/22 Office Visit Desmond Dike, NP [place] [facility] [address] [facility]   02/19/22 Office Visit Loran Senters, FNP [place] [facility] [address] [facility]   01/02/22 Office Visit Loran Senters, FNP [place] [facility] [address] [facility]   11/28/21 Office Visit Loran Senters, FNP [place] [facility] [address] [facility]   10/10/21 Office Visit Loran Senters, FNP [place] [facility] [address] [facility]   09/11/21 Office Visit Loran Senters, FNP [place] [facility] [address] [facility]   07/13/21 Office Visit Loran Senters, FNP [place] [facility] At [facility]   04/12/21 Office Visit Loran Senters, FNP [place] [facility]   Showing recent visits within past 365 days with a meds authorizing provider and meeting all other requirements  Future Appointments  Date Type Provider Dept   04/10/22 Appointment Loran Senters, FNP [place] [facility] [address] [facility]   Showing future appointments within next 365 days with a meds authorizing provider and meeting all other requirements       Labs: A1c:   Hemoglobin A1C (%)   Date Value   01/15/2022 7.9 (H)   10/10/2021 7.3 (A)   09/15/2014 6.0

## 2022-04-02 NOTE — Unmapped (Unsigned)
Please call Logan Quinn to change appt since to early for  A1C on 04/10/22.  Needs to be 04/17/22 or after.  Thanks!

## 2022-04-08 ENCOUNTER — Encounter (INDEPENDENT_AMBULATORY_CARE_PROVIDER_SITE_OTHER): Payer: Self-pay

## 2022-04-10 ENCOUNTER — Telehealth: Admit: 2022-04-10 | Discharge: 2022-04-11 | Payer: MEDICAID | Attending: Registered" | Primary: Registered"

## 2022-04-10 NOTE — Unmapped (Signed)
Outpatient Adult Nutrition-Follow-up Assessment    Logan Quinn is a 41 y.o. male seen for medical nutrition therapy.    Referring Provider or Clinic: Loran Senters    Primary Care Provider: Loran Senters, FNP    Reason for Referral: Weight Loss    NUTRITION ASSESSMENT - DATA COLLECTION:  New developments since last nutrition appointment on : 02/13/2022  n/a    HPI:  Logan Quinn is a 41 y.o. male with a BMI of 69.81 kg/m2.  Primary reason for wanting obesity treatment: physician recommendation  Motivation: better quality of life and improved ability to be active and improvement in health conditions  Overall goal:150 lbs    Malnutrition Assessment using AND/ASPEN Clinical Characteristics:  Patient does not meet AND/ASPEN criteria for malnutrition at this time (04/10/22 0831)    NUTRITION RECOMMENDATIONS:  There are no Patient Instructions on file for this visit.      Anthropometrics:  Height:   Ht Readings from Last 1 Encounters:   03/13/22 180.3 cm (5' 11)     Weight:   Wt Readings from Last 1 Encounters:   03/13/22 (!) 217.8 kg (480 lb 3.2 oz)       BMI Readings from Last 1 Encounters:   03/13/22 66.97 kg/m??       Weight Assessment:     Wt Readings from Last 5 Encounters:   03/13/22 (!) 217.8 kg (480 lb 3.2 oz)   02/27/22 (!) 216.8 kg (478 lb)   02/19/22 (!) 216.4 kg (477 lb)   02/14/22 (!) 222.3 kg (490 lb)   01/15/22 (!) 221.2 kg (487 lb 9.6 oz)        Weight change: - 10 lbs   Adjusted Body Weight: 132.3 kg    Ideal Body Weight: 75.3 kg  Percentage Ideal Body Weight: 278.97  Goal weight: 150 lbs    Nutrition-Focused Physical Findings:    Nutrition Focused Physical Exam:    Nutrition Evaluation  Overall Impressions: Unable to perform Nutrition-Focused Physical Exam at this time due to visit type - virtual  (04/10/22 0831)  Nutrition Designation: Obese class III  (BMI > 39.99 kg/m2) (04/10/22 0831)    Relevant Labs:  Component Value Date   ?? HGB 14.3 01/15/2022   ?? HCT 42.6 01/15/2022   ?? MCV 95.0 01/15/2022   ?? IRON 63 (L) 01/15/2022   ?? TIBC 277 01/15/2022   ?? TRANSFERRIN 244.3 03/24/2019   ?? LABIRON 23 01/15/2022   ?? FERRITIN 125.0 01/15/2022   ?? VITDTOTAL 16.7 (L) 01/15/2022   ?? [id number] 347 01/15/2022   ?? A1C 7.9 (H) 01/15/2022   ?? EAG 180 01/15/2022   ??    Relevant Medications/Supplements:  Reviewed nutritionally relevant medications/supplements:    Current Outpatient Medications   Medication Sig Dispense Refill   ??? acetaminophen (TYLENOL) 500 MG tablet Take 2 tablets (1,000 mg total) by mouth Three (3) times a day. 30 tablet 0   ??? adalimumab (HUMIRA,CF, PEN) 80 mg/0.8 mL PnKt Inject the contents of 1 pen (80mg ) under the skin once a week. 4 each 11   ??? albuterol 2.5 mg /3 mL (0.083 %) nebulizer solution Inhale 3 mL (2.5 mg total) by nebulization every four (4) hours as needed for wheezing. 180 mL 2   ??? albuterol HFA 90 mcg/actuation inhaler Inhale 2 puffs every six (6) hours as needed for wheezing. 8 g 5   ??? ALPRAZolam (XANAX) 0.5 MG tablet Takes 1 tablet nightly and 1 tablet  a day when needed     ??? ascorbic acid (VITAMIN C ORAL) Take 1 capsule by mouth nightly.     ??? blood sugar diagnostic (ACCU-CHEK GUIDE TEST STRIPS) Strp by Other route Three (3) times a day before meals. 300 each 1   ??? blood-glucose meter kit Use as directed 1 each 0   ??? budesonide-formoteroL (SYMBICORT) 80-4.5 mcg/actuation inhaler Inhale 2 puffs Two (2) times a day. 10.2 g 11   ??? chlorhexidine (PERIDEX) 0.12 % solution      ??? clindamycin (CLEOCIN T) 1 % external solution clindamycin phosphate 1 % topical solution   APPLY TOPICALLY TO GROIN AREA TWICE A DAY     ??? clindamycin (CLEOCIN T) 1 % lotion APPLY TOPICALLY AT NIGHT TO INFLAMED AREAS AS NEEDED FOR FLARES     ??? clobetasoL (TEMOVATE) 0.05 % Gel      ??? clobetasoL (TEMOVATE) 0.05 % ointment Apply daily to painful affected areas if needed for flares, then stop 15 g 1   ??? colchicine (MITIGARE) 0.6 mg cap capsule TAKE 2 CAPSULES BY MOUTH ON DAY ONE, FOLLOWED BY ONE CAPSULE ONE HOUR LATER, ON DAY 2 TAKE ONE CAPSULE BY MOUTH TWICEA DAY UNTIL RESOLVED 60 capsule 0   ??? cyclobenzaprine (FLEXERIL) 10 MG tablet Take 1 tablet (10 mg total) by mouth Three (3) times a day as needed for muscle spasms. 60 tablet 1   ??? dextroamphetamine sulfate (DEXTROSTAT) 10 MG tablet Take 2 tablets (20 mg total) by mouth two (2) times a day.     ??? diclofenac (VOLTAREN) 50 MG EC tablet Take 1 tablet (50 mg total) by mouth Two (2) times a day. As needed for pain. (Patient not taking: Reported on 03/13/2022) 30 tablet 1   ??? diphenoxylate-atropine (LOMOTIL) 2.5-0.025 mg per tablet Take 1 tablet by mouth two (2) times a day as needed for diarrhea. 30 tablet 5   ??? dulaglutide (TRULICITY) 3 mg/0.5 mL injection pen Inject 0.5 mL (3 mg total) under the skin every seven (7) days. 2 mL 2   ??? DULoxetine (CYMBALTA) 60 MG capsule Take 1 capsule (60 mg total) by mouth Two (2) times a day. 180 capsule 0   ??? empty container Misc Use as directed to dispose of needles. When full, make sure lid is closed tightly then dispose of container in trash. 1 each 2   ??? erenumab-aooe (AIMOVIG AUTOINJECTOR) 140 mg/mL AtIn Inject 1 Pen under the skin every thirty (30) days. 3 mL 1   ??? famotidine (PEPCID) 20 MG tablet Take 1 tablet (20 mg total) by mouth Two (2) times a day. 180 tablet 1   ??? fluoride, sodium, 1.1 % Pste      ??? fluticasone propionate (FLONASE) 50 mcg/actuation nasal spray 1 spray into each nostril Two (2) times a day. 16 g 5   ??? glipiZIDE (GLUCOTROL) 10 MG tablet Take 2 tablets (20 mg total) by mouth Two (2) times a day (30 minutes before a meal). 360 tablet 1   ??? HUMALOG U-100 INSULIN 100 unit/mL injection INJECT 120 UNITS UNDER SKIN DAILY VIA OMNIPOD 40 mL 5   ??? hydroCHLOROthiazide (HYDRODIURIL) 25 MG tablet Take 1 tablet (25 mg total) by mouth daily. 90 tablet 3   ??? hydrocortisone (PROCTOSOL HC) 2.5 % rectal cream Insert 1 application into the rectum two (2) times a day as needed. 28.35 g 2   ??? hydrOXYzine (ATARAX) 25 MG tablet Take 1 tablet (25 mg total) by mouth Three (3) times a  day as needed. May take two before bed for sleep. 120 tablet 1   ??? ibuprofen (ADVIL,MOTRIN) 800 MG tablet Take 1 tablet (800 mg total) by mouth every eight (8) hours as needed. 90 tablet 1   ??? insulin glargine (LANTUS) 100 unit/mL injection Inject 0.05 mL (5 Units total) under the skin nightly. 10 mL 3   ??? insulin pump cart,automated,BT (OMNIPOD 5 G6 PODS, GEN 5,) Crtg Change POD every 48 hours. 3 each 11   ??? insulin syringe-needle U-100 1 mL 31 gauge x 5/16 (8 mm) Syrg Once daily for insulin dosing. 100 each 3   ??? ketoconazole (NIZORAL) 2 % cream      ??? lancets (ACCU-CHEK FASTCLIX LANCET DRUM) Misc Use to check blood sugars 3 times a day before meals or as directed 200 each 5   ??? levothyroxine (SYNTHROID) 50 MCG tablet Take 1 tablet (50 mcg total) by mouth daily. 90 tablet 1   ??? lidocaine (XYLOCAINE) 5 % ointment lidocaine 5 % topical ointment     ??? montelukast (SINGULAIR) 10 mg tablet Take 1 tablet (10 mg total) by mouth daily. 90 tablet 3   ??? multivitamin (MULTIPLE VITAMIN ORAL) Take 1 tablet by mouth daily.     ??? nystatin (MYCOSTATIN) 100,000 unit/gram powder      ??? ondansetron (ZOFRAN) 4 MG tablet Take 1 tablet (4 mg total) by mouth every eight (8) hours as needed for nausea. 30 tablet 1   ??? oxybutynin (DITROPAN) 5 MG tablet Take 1 tablet (5 mg total) by mouth Two (2) times a day. 180 tablet 1   ??? pantoprazole (PROTONIX) 40 MG tablet Take 1 tablet (40 mg total) by mouth Two (2) times a day. 180 tablet 1   ??? polyethylene glycol (GLYCOLAX) 17 gram/dose powder      ??? promethazine (PHENERGAN) 25 MG tablet Take 1 tablet (25 mg total) by mouth every eight (8) hours as needed for nausea. 30 tablet 1   ??? propranoloL (INDERAL LA) 120 mg 24 hr capsule Take 1 capsule (120 mg total) by mouth daily. 90 capsule 1   ??? ramelteon (ROZEREM) 8 mg tablet Take 1 tablet (8 mg total) by mouth nightly.     ??? safety needles (BD SAFETYGLIDE NEEDLE) 23 gauge x 1 Ndle 1 Units by Miscellaneous route once a week. 12 each 11   ??? safety needles 18 gauge x 1 1/2 Ndle 1 Units by Miscellaneous route once a week. 12 each 11   ??? sour cherry extract (TART CHERRY EXTRACT) 1,000 mg cap      ??? tadalafiL (CIALIS) 5 MG tablet Take 1 tablet (5 mg total) by mouth in the morning. 90 tablet 3   ??? testosterone cypionate (DEPOTESTOTERONE CYPIONATE) 200 mg/mL injection Inject 0.5 mL (100 mg total) into the muscle once a week. Inject 0.5cc (100mg ) weekly 2 mL 5   ??? topiramate (TOPAMAX) 50 MG tablet Take 1.5 tablets (75 mg total) by mouth Two (2) times a day. 270 tablet 1     No current facility-administered medications for this visit.       Physical Activity:  walking for about 15 minutes most days per week     Food Intolerances/Dietary Restrictions:  No known food allergies or food intolerances.     Other GI Issues:  Upset stomach/ indigestion for the past 2 days. He is taking Pepcid and Protonix - helps    Hunger and Satiety:  managed    24-Hour Recall or Usual Intake:  Time  Intake   Breakfast Eggs - hardboiled, whole grain toast, yogurt     Snack  Carrots or broccoli    Lunch Shrimp with broccoli or cauliflower    Snack  Yogurt or carrots, fruit cups    Dinner Grilled chicken and side salad    Snack  Fruit or spoonful of peanut butter - if blood sugar is low     Food and Nutrient Intake:  Beverages:  Water, sweet tea when dining out   Dining Out:  3 times per month     Behavioral Factors:  Overeating: denies  Emotional Eating: denies   Grazing: denies  Fast Eating: denies  Nighttime Eating: denies    Factors Affecting Food Intake:  Knowledge and Beliefs: Food and nutrition related knowledge has improved with nutrition education and counseling. He has willingness to try new foods.  Stress/Anxiety: Patient with medical history of depression., Patient with medical history of anxiety.   Sleep Patterns: Patient with obstructive sleep apnea. Using CPAP.   Food Safety and Access: He did not report issues.     Nutrition Goals & Evaluation      Apply dietary recommendations to achieve goal weight of 150 lb (Ongoing and Progressing)  Sustainable weight loss of 5-10% (25-49 lb) in the next 6 months (New)  Meet nutritional needs  (New)  Reduce obesity-related long-term health risk  (New)    Nutrition goals reviewed, and relevant barriers identified and addressed: none evident. [name] is evaluated to have a willingness and ability to achieve nutrition goals.     Nutrition Assessment       [name] has Obesity as evidenced by a BMI of 66.97.  Barriers to weight loss identified and being addressed. Since previous nutrition visit, patient appears to be doing well with nutrition goals and recommendations. Food history indicates usual diet is fair in protein and non-starchy vegetables. Variety is encouraged. He is continuing to work on eliminating sweetened beverages in diet.    Nutrition Intervention      Nutrition Plan : continue with current nutrition plan    Med diet/eating style with IBS considerations     ??  Education: increase variety in diet    ??  Education resources provided include:  ??? Handouts promoting nutrition intervention  ??? Lists of foods recommended and foods not recommended  ??? Sample menu plan(s), recipes supporting nutrition intervention  ??? Online resources  ??? After visit summary with patient instructions  and contact information   ??    Estimated Needs:  Daily Estimated Nutrient Needs:  Energy: 3090 kcals Per [place] St-Jeor Equation using last recorded weight, 217.8 kg (04/11/22 1003)]  Protein: 106-132 gm [0.8-1 gm/kg using adjusted body weight, 132.3 kg (04/11/22 1003)]  Carbohydrate:   [45-60% of kcal]  Fluid: 3000 mL [ ]       Follow up will occur in 4-8 weeks     Food/Nutrition-related history, Anthropometric measurements, Biochemical data, medical tests, procedures, Nutrition-focused physical findings, Patient understanding or compliance with intervention and recommendations , Effectiveness of nutrition interventions and Effectiveness of nutrition prescription/order   will be assessed at time of follow-up.         Recommendations for Referring Provider :  See instructions            The patient reports they are physically located in [place] and is currently: at home. I conducted a audio/video visit. I spent  16m 51s on the video call with the patient. I spent an additional 23 minutes on  pre- and post-visit activities on the date of service .     The patient was not located and I was located within 250 yards of a hospital-based location during the real-time audio and video.

## 2022-04-11 NOTE — Unmapped (Signed)
Continue with current nutrition plan     Try to keep intake consistent  and avoid skipping any meals.     Spread meal out into about 5-6 small meals.     Try to keep meals no more than 4 hours apart.      Eat only to your natural satiety level. You should feel satisfied, not ???uncomfortable.???      Include plenty of heathy options in your diet     Focus on healthy food options like lean protein and non-starchy vegetables.     Follow the plate method for serving suggestions.     Avoid fried or greasy foods that are unhealthy and may be difficult to tolerate.         Avoid intake of artificial/sugary/ carbonated beverages. Hydrate with water daily (at least 64 ounces per day)     Try to stay active as able throughout your day.

## 2022-04-17 NOTE — Unmapped (Signed)
Clarified with Logan Quinn, Logan Quinn is changed every 48 hours and 3 boxes last him a month.  Getting request from [facility] with changing pod every 72 hours and needing refills.  We sent in refills 10/10/2021 for 1 year.  Logan Quinn with [facility] states they do have an no refills needed.

## 2022-04-18 DIAGNOSIS — I10 Essential (primary) hypertension: Principal | ICD-10-CM

## 2022-04-18 MED ORDER — HYDROCHLOROTHIAZIDE 25 MG TABLET
ORAL_TABLET | Freq: Every day | ORAL | 1 refills | 90 days | Status: CP
Start: 2022-04-18 — End: 2023-04-18

## 2022-04-18 MED ORDER — PANTOPRAZOLE 40 MG TABLET,DELAYED RELEASE
ORAL_TABLET | Freq: Two times a day (BID) | ORAL | 1 refills | 90 days
Start: 2022-04-18 — End: 2023-04-18

## 2022-04-18 MED ORDER — FAMOTIDINE 20 MG TABLET
ORAL_TABLET | Freq: Two times a day (BID) | ORAL | 1 refills | 90 days
Start: 2022-04-18 — End: 2023-04-18

## 2022-04-18 MED ORDER — PROPRANOLOL ER 120 MG CAPSULE,24 HR,EXTENDED RELEASE
ORAL_CAPSULE | Freq: Every day | ORAL | 1 refills | 90 days | Status: CP
Start: 2022-04-18 — End: 2023-04-18

## 2022-04-18 NOTE — Unmapped (Signed)
Is he suppose to be on both BID    Patient is requesting the following refill  Requested Prescriptions     Pending Prescriptions Disp Refills    pantoprazole (PROTONIX) 40 MG tablet [Pharmacy Med Name: PANTOPRAZOLE DR 40 MG TAB] 180 tablet 1     Sig: Take 1 tablet (40 mg total) by mouth two (2) times a day.    famotidine (PEPCID) 20 MG tablet [Pharmacy Med Name: FAMOTIDINE 20 MG TAB] 180 tablet 1     Sig: Take 1 tablet (20 mg total) by mouth two (2) times a day.     Signed Prescriptions Disp Refills    propranoloL (INDERAL LA) 120 mg 24 hr capsule 90 capsule 1     Sig: Take 1 capsule (120 mg total) by mouth daily.     Authorizing Provider: Loran Senters     Ordering User: Barbaraann Boys    hydroCHLOROthiazide (HYDRODIURIL) 25 MG tablet 90 tablet 1     Sig: Take 1 tablet (25 mg total) by mouth daily.     Authorizing Provider: Loran Senters     Ordering User: Barbaraann Boys       Recent Visits  Date Type Provider Dept   02/27/22 Office Visit Desmond Dike, NP [place] [facility] [address] [facility]   02/19/22 Office Visit Loran Senters, FNP [place] [facility] [address] [facility]   01/02/22 Office Visit Loran Senters, FNP [place] [facility] [address] [facility]   11/28/21 Office Visit Loran Senters, FNP [place] [facility] [address] At [facility]   10/10/21 Office Visit Loran Senters, FNP [place] [facility] [address] [facility]   09/11/21 Office Visit Loran Senters, FNP [place] [facility] [address] At [facility]   07/13/21 Office Visit Loran Senters, FNP [place] [facility]   Showing recent visits within past 365 days with a meds authorizing provider and meeting all other requirements  Future Appointments  Date Type Provider Dept   04/19/22 Appointment Loran Senters, FNP [place] [facility] [address] At [facility]   Showing future appointments within next 365 days with a meds authorizing provider and meeting all other requirements       Labs: Not applicable this refill

## 2022-04-18 NOTE — Unmapped (Signed)
[facility] Refill Coordination Note    Specialty Medication(s) to be Shipped:   Inflammatory Disorders: Humira     Other medication(s) to be shipped: No additional medications requested for fill at this time     Logan Quinn, DOB: 1980/11/16  Phone: [phone] (home)     All above HIPAA information was verified with patient.     Was a Nurse, learning disability used for this call? No    Completed refill call assessment today to schedule patient's medication shipment from the [facility] [phone]).  All relevant notes have been reviewed.     Specialty medication(s) and dose(s) confirmed: Regimen is correct and unchanged.   Changes to medications: Karar reports starting the following medications: Wellbutrin  Changes to insurance: No  New side effects reported not previously addressed with a pharmacist or physician: None reported  Questions for the pharmacist: No    Confirmed patient received a Conservation officer, historic buildings and a Surveyor, mining with first shipment. The patient will receive a drug information handout for each medication shipped and additional FDA Medication Guides as required.       DISEASE/MEDICATION-SPECIFIC INFORMATION        For patients on injectable medications: Patient currently has 1 doses left.  Next injection is scheduled for 11/9.    SPECIALTY MEDICATION ADHERENCE     Medication Adherence    Patient reported X missed doses in the last month: 0  Specialty Medication: Humira  Patient is on additional specialty medications: No  Any gaps in refill history greater than 2 weeks in the last 3 months: no  Demonstrates understanding of importance of adherence: yes  Informant: patient  Reliability of informant: reliable              Confirmed plan for next specialty medication refill: delivery by pharmacy  Refills needed for supportive medications: not needed        Were doses missed due to medication being on hold? No    Humira CF 80mg /0.43ml Inj: 7 days of medicine on hand     REFERRAL TO PHARMACIST     Referral to the pharmacist: Not needed    [facility]     Shipping address confirmed in Epic.     Delivery Scheduled: Yes, Expected medication delivery date: 11/10.     Medication will be delivered via Same Day Courier to the prescription address in Epic WAM.    Valere Dross   [facility] Specialty Technician

## 2022-04-19 MED ORDER — DICYCLOMINE 10 MG CAPSULE
ORAL_CAPSULE | Freq: Four times a day (QID) | ORAL | 3 refills | 30 days | Status: CP
Start: 2022-04-19 — End: 2023-04-19

## 2022-04-19 MED ORDER — FAMOTIDINE 20 MG TABLET
ORAL_TABLET | Freq: Two times a day (BID) | ORAL | 1 refills | 90 days | Status: CP
Start: 2022-04-19 — End: 2023-04-19

## 2022-04-19 MED ORDER — PANTOPRAZOLE 40 MG TABLET,DELAYED RELEASE
ORAL_TABLET | Freq: Two times a day (BID) | ORAL | 1 refills | 90 days | Status: CP
Start: 2022-04-19 — End: 2023-04-19

## 2022-04-19 MED FILL — HUMIRA(CF) PEN 80 MG/0.8 ML SUBCUTANEOUS KIT: SUBCUTANEOUS | 28 days supply | Qty: 4 | Fill #9

## 2022-04-19 NOTE — Unmapped (Signed)
Refill Bentyl.  [name] aware [name] doesn't need to see him until 05/21/22 since she last saw him 02/19/2022.  Appt scheduled for 05/21/22 at 3:40.  Can do A1C then.  [name] sent Bentyl refill.

## 2022-05-07 DIAGNOSIS — Z794 Long term (current) use of insulin: Principal | ICD-10-CM

## 2022-05-07 DIAGNOSIS — E1165 Type 2 diabetes mellitus with hyperglycemia: Principal | ICD-10-CM

## 2022-05-07 MED ORDER — INSULIN GLARGINE (U-100) 100 UNIT/ML SUBCUTANEOUS SOLUTION
Freq: Every evening | SUBCUTANEOUS | 0 refills | 200 days | Status: CP
Start: 2022-05-07 — End: 2023-05-07

## 2022-05-07 NOTE — Unmapped (Deleted)
Patient is requesting the following refill  Requested Prescriptions     Pending Prescriptions Disp Refills    insulin glargine (LANTUS) 100 unit/mL injection 10 mL 3     Sig: Inject 0.05 mL (5 Units total) under the skin nightly.       Recent Visits  Date Type Provider Dept   02/27/22 Office Visit Desmond Dike, NP [place] [facility] [address] [facility]   02/19/22 Office Visit Loran Senters, FNP [place] [facility] [address] [facility]   01/02/22 Office Visit Loran Senters, FNP [place] [facility] [address] At [facility]   11/28/21 Office Visit Loran Senters, FNP [place] [facility] [address] At [facility]   10/10/21 Office Visit Loran Senters, FNP [place] [facility] [address] [facility]   09/11/21 Office Visit Loran Senters, FNP [place] [facility] [address] At [facility]   07/13/21 Office Visit Loran Senters, FNP [place] [facility]   Showing recent visits within past 365 days with a meds authorizing provider and meeting all other requirements  Future Appointments  Date Type Provider Dept   05/21/22 Appointment Loran Senters, FNP  [facility] [address] [facility]   Showing future appointments within next 365 days with a meds authorizing provider and meeting all other requirements       Labs: A1c:   Hemoglobin A1C (%)   Date Value   01/15/2022 7.9 (H)   10/10/2021 7.3 (A)   09/15/2014 6.0

## 2022-05-07 NOTE — Unmapped (Signed)
Patient is requesting the following refill  Requested Prescriptions     Pending Prescriptions Disp Refills    insulin glargine (LANTUS) 100 unit/mL injection 10 mL 3     Sig: Inject 0.05 mL (5 Units total) under the skin nightly.       Recent Visits  Date Type Provider Dept   02/27/22 Office Visit Desmond Dike, NP [place] [facility] [address] [facility]   02/19/22 Office Visit Loran Senters, FNP [place] [facility] [address] [facility]   01/02/22 Office Visit Loran Senters, FNP [place] [facility] [address] [facility]   11/28/21 Office Visit Loran Senters, FNP [place] [facility] [address] [facility]   10/10/21 Office Visit Loran Senters, FNP [place] [facility] [address] [facility]   09/11/21 Office Visit Loran Senters, FNP [place] [facility] [address] [facility]   07/13/21 Office Visit Loran Senters, FNP [place] [facility]   Showing recent visits within past 365 days with a meds authorizing provider and meeting all other requirements  Future Appointments  Date Type Provider Dept   05/21/22 Appointment Loran Senters, FNP [place] [facility] [address] [facility]   Showing future appointments within next 365 days with a meds authorizing provider and meeting all other requirements       Labs: A1c:   Hemoglobin A1C (%)   Date Value   01/15/2022 7.9 (H)   10/10/2021 7.3 (A)   09/15/2014 6.0    Creatinine:   Creatinine (mg/dL)   Date Value   16/03/9603 0.92   01/17/2014 0.74

## 2022-05-13 ENCOUNTER — Ambulatory Visit
Admit: 2022-05-13 | Discharge: 2022-05-14 | Payer: MEDICAID | Attending: Physician Assistant | Primary: Physician Assistant

## 2022-05-13 ENCOUNTER — Ambulatory Visit: Admit: 2022-05-13 | Discharge: 2022-05-14 | Payer: MEDICAID

## 2022-05-13 DIAGNOSIS — Z794 Long term (current) use of insulin: Principal | ICD-10-CM

## 2022-05-13 DIAGNOSIS — G43909 Migraine, unspecified, not intractable, without status migrainosus: Principal | ICD-10-CM

## 2022-05-13 DIAGNOSIS — E1165 Type 2 diabetes mellitus with hyperglycemia: Principal | ICD-10-CM

## 2022-05-13 DIAGNOSIS — Z6841 Body Mass Index (BMI) 40.0 and over, adult: Principal | ICD-10-CM

## 2022-05-13 LAB — HEMATOCRIT: HEMATOCRIT: 46.1 % (ref 39.0–48.0)

## 2022-05-13 MED ORDER — TOPIRAMATE 50 MG TABLET
ORAL_TABLET | Freq: Two times a day (BID) | ORAL | 1 refills | 90.00000 days | Status: CP
Start: 2022-05-13 — End: 2023-05-13

## 2022-05-13 MED ORDER — LANTUS U-100 INSULIN 100 UNIT/ML SUBCUTANEOUS SOLUTION
3 refills | 0 days
Start: 2022-05-13 — End: ?

## 2022-05-13 NOTE — Unmapped (Signed)
Obesity Medicine summary at the initial visit on 01/15/2022   Logan Quinn is a 41 y.o. male and Body mass index is 66.97 kg/m??..  He presents with obesity contributing to multiple co morbidities and affecting her ability to be active.  His target weight is 150 lbs .  His weight gain history is suggestive of an early onset obesity and progressive weight gain. Additional causes of his obesity include metabolic effects of consumption of processed food, irregular eating patterns, circadian disruption, and suboptimal physical activity  Co-morbidities at this time include Type II Diabetes Mellitus, GERD, Depression, Anxiety, OSA, Hidradenitis Suppurativa, Hypertension, Nonalcoholic Fatty Liver Disease, Asthma, Hypothyroidism, IBS, Chronic pansinusitis, Chronic joint pain, Migraines, Venous Insufficiency, Neuropathy in diabetes, Gout.      Treatment course  Starting weight at presentation:487.6 lbs  Weight today: 469.4 lbs  Weight loss since last visit about 8 weeks ago: 10.7 lbs  Total weight loss since presentation:18.2 lbs   Percent weight loss since presentation:3.7%  Percent weight loss with lifestyle modifications and trulicity: 3.7%    Previous use of anti-obesity medications:      12/04/2021     4:13 PM   Weight Loss Medication History   Have you ever used any of the medications from this list? Metformin    Topiramate    Trulicity (dulaglutide)   Enter the amount of weight loss and side-effects for any medications selected in the previous question 10-15       Narrative history  ??No c/o   ?  Diet quality:  Has not been snacking between meals. Trying the mediterranean diet    Overview of typical 24 hr recall:   8 am - 2 hard boiled eggs  12 pm - grilled chicken and salad  2 pm - none  8 pm - salmon and broccoli  10 pm -  none     Beverages: water, seldom Diet Dr Reino Kent   Alcohol: none   Eating out once and a while    Dietary patterns and associated behaviors: fairly consistent meal pattern.  Denies stress/emotional, late night, bored, or binge eating.   ?  ?Exercise: He strained his ankle. Walk 15 minutes 3 times a week.   ??  Sleep: He has a h/o Sleep apnea and compliant with CPAP. Duration: 6 hours. He feels in the morning.   ??  Stress: Managing well.   ???  Medications: Trulicity prescribed by PCP. He has been able to eat less food.      Logan Quinn has Diabetes type 2, uncontrolled; Lesion of radial nerve; GERD (gastroesophageal reflux disease) (RAF-HCC); Depression (RAF-HCC); Suicidal ideation; Abdominal pain; Headache; NAFLD (nonalcoholic fatty liver disease); Bleeding external hemorrhoids; Anxiety; Diarrhea; Obesity; Type 2 diabetes mellitus with hyperglycemia (CMS-HCC); Left shoulder pain; Trapezius muscle strain; Primary insomnia; Chronic joint pain; Cellulitis of right lower extremity; No diabetic retinopathy in both eyes; Congenital hypertrophy of retinal pigment epithelium; H/O retinal detachment; IBS (irritable bowel syndrome); Urinary incontinence, nocturnal enuresis; Severe obstructive sleep apnea; OSA on CPAP; Hidradenitis suppurativa; Acute non-recurrent pansinusitis; Acquired hypothyroidism; Essential hypertension; Mild intermittent asthma without complication; Chronic pansinusitis; Environmental and seasonal allergies; Erectile dysfunction; Sinus congestion; Nasal congestion; and Cough variant asthma on their problem list.     has a current medication list which includes the following prescription(s): acetaminophen, humira(cf) pen, albuterol, albuterol, alprazolam, ascorbic acid, accu-chek guide test strips, budesonide-formoterol, chlorhexidine, clindamycin, clindamycin, clobetasol, clobetasol, colchicine, cyclobenzaprine, dextroamphetamine sulfate, dicyclomine, diphenoxylate-atropine, dulaglutide, duloxetine, empty container, aimovig autoinjector, famotidine, fluoride (  sodium), glipizide, humalog u-100 insulin, hydrochlorothiazide, hydrocortisone, hydroxyzine, ibuprofen, insulin glargine, omnipod 5 g6 pods (gen 5), insulin syringe-needle u-100, ketoconazole, lancets, levothyroxine, lidocaine, multivitamin, nystatin, ondansetron, oxybutynin, pantoprazole, polyethylene glycol, promethazine, propranolol, ramelteon, bd safetyglide needle, safety needles, tart cherry extract, tadalafil, testosterone cypionate, topiramate, wellbutrin xl, blood-glucose meter, diclofenac, fluticasone propionate, and montelukast.     is allergic to erythromycin, penicillins, sulfa (sulfonamide antibiotics), and other.      reports that he has never smoked. He has never been exposed to tobacco smoke. He has never used smokeless tobacco. He reports that he does not drink alcohol and does not use drugs.       Vital Signs  BP 134/73  - Pulse 74  - Ht 180.3 cm (5' 10.98)  - Wt (!) 212.9 kg (469 lb 6.4 oz)  - BMI 65.50 kg/m??    BMI:  (!) 65.5      Exam:  AAO x3  Respiratory:No labored breathing.       Plan at initial evaluation on 01/15/2022     1. Based on my assessment of the possible etiologies for the patient's obesity, medical co-morbidities, and review of systems my recommendations include the following:     Diet quality:   Diet is inadequate in lean protein, inadequate in vegetables, moderate in processed foods and low in SSB.     Patient was encouraged to focus on consuming lean protein, a variety of non starchy vegetables and fruit with each meal and snacks. Wean off carbonated and sugar sweetened beverages. Avoid consuming processed and fast foods; and avoid refined carbohydrates ( such as white bread, biscuits, white pasta, white rice, grits, crackers).      Dietary patterns and associated behaviors:  Meal pattern is inconsistent. Patient tends to skip meals.     We discussed the importance of having a  consistent meal pattern with three meals per day and 1-3 snacks. Avoid skipping meals or grazing. Avoid going longer than 4 hours without eating.     Patient asked to schedule meals and snacks every 3 hours. Recommended Premier Protein, Ensure Max, Boost Max and Fair Life Core shakes as a meal replacement to avoid skipping meals. Or choose a protein shake with less than 200 calories, at least 20 grams of protein, less than 10 grams of total carbohydrates and 5 or less of total fat.             Exercise: PA not adequate for age/risk factors      We discussed scheduling exercise at least 5-10 minutes once a week then gradually add another day every 2-3 weeks as tolerated. Next try to increase exercise duration by a few minutes each week until you get to 30 minutes most days of the week.     Then add resistance training by alternating between upper and lower body resistance exercises using bands or free weights 2-3 times a week.     Recommended walking, [employer] chair exercises on [email or web address] or Kellie Shropshire walk at home videos to get started on a routine.            Sleep: Patient has a h/o OSA and is compliant with CPAP therapy. Good sleep hygiene.      Encouraged healthy sleep habits/sleep hygiene such as going to bed when sleepy, trying to get between 7-8 hours per night,  going to bed around the same time nightly and waking around the same time each morning.   Avoid screen time at least one hour  before bed.  Avoid napping during the day.   Wear CPAP/BIPAP nightly.   Avoid drinking at least 2 hours before bed. If your mouth is dry, try small sips to moisten your mouth.   Try Yoga Nidra on YouTube.com (the lady in the blue shirt) to help with improving sleep, relaxation, releasing tension and stress.            Stress management: Stress level is currently moderate, feels that it is manageable, and no worrisome sx such as SI/HI, self harm, or worsening of previous psych diagnosis           Weight gain causing medications:Gabapentin and Duloxetine.          Anti Obesity Medications: A few anti-obesity medications may be used for this patient including metformin, topiramate, wellbutrin, GLP1 agonists or SGLT2 inhibitors pending insurance coverage.        Obesity Surgery: Patient is interested in surgery and meets criteria for bariatric surgery.   Patient requires a supervised diet and will refer to the bariatric intake clinic after completion of supervised diet visit.  He will need to lose weight down to 450 lbs before bariatric intake.     ?   Labwork ordered: CBC, CMP, HgbA1C, TSH, Vitamin B12, Vitamin D, Folate, Ferritin, Iron Panel, Lipid panel, Tox screen and Nicotine screen.       Plan on 03/13/2022  He is responding well with Trulicity and has consistent weight loss with Trulicity. Recommended patient continue Trulicity as prescribed.     We discussed increasing walking and be consistent with at least three days a week for 15 minutes.     Continue to wean down on sweetened and carbonated beverages.     Start consuming lean protein first with meals, then non starchy vegetables and carbs last.     Also start practicing not drinking with meal.    Follow up in 2 months.     Plan on 05/13/2022  He is responding well with Trulicity and has consistent weight loss with Trulicity. Recommended patient continue Trulicity as prescribed.     Continue walking least three days a week for 15 minutes. Start Chair exercises from [facility] daily.     Wean off sweetened and carbonated beverages.     Also start practicing not drinking with meal.    Follow up in 2 months.     Visit Time:   I personally spent 19 minutes face-to-face and non-face-to-face in the care of this patient, which includes all pre, intra, and post visit time on the date of service.  All documented time was specific to the E/M visit and does not include any procedures that may have been performed.

## 2022-05-13 NOTE — Unmapped (Signed)
[facility] Refill Coordination Note    Specialty Medication(s) to be Shipped:   Inflammatory Disorders: Humira     Other medication(s) to be shipped: No additional medications requested for fill at this time     Logan Quinn, DOB: May 20, 1981  Phone: [phone] (home)     All above HIPAA information was verified with patient.     Was a Nurse, learning disability used for this call? No    Completed refill call assessment today to schedule patient's medication shipment from the [facility] [phone]).  All relevant notes have been reviewed.     Specialty medication(s) and dose(s) confirmed: Regimen is correct and unchanged.   Changes to medications: Mylin reports no changes at this time.  Changes to insurance: No  New side effects reported not previously addressed with a pharmacist or physician: None reported  Questions for the pharmacist: No    Confirmed patient received a Conservation officer, historic buildings and a Surveyor, mining with first shipment. The patient will receive a drug information handout for each medication shipped and additional FDA Medication Guides as required.       DISEASE/MEDICATION-SPECIFIC INFORMATION        For patients on injectable medications: Patient currently has 1 doses left.  Next injection is scheduled for 12/7.    SPECIALTY MEDICATION ADHERENCE     Medication Adherence    Patient reported X missed doses in the last month: 0  Specialty Medication: Humira  Patient is on additional specialty medications: No  Any gaps in refill history greater than 2 weeks in the last 3 months: no  Demonstrates understanding of importance of adherence: yes  Informant: patient  Reliability of informant: reliable              Confirmed plan for next specialty medication refill: delivery by pharmacy  Refills needed for supportive medications: not needed        Were doses missed due to medication being on hold? No    Humira CF 80mg /0.1ml Inj: 7 days of medicine on hand     REFERRAL TO PHARMACIST     Referral to the pharmacist: Not needed    [facility]     Shipping address confirmed in Epic.     Delivery Scheduled: Yes, Expected medication delivery date: 12/8.     Medication will be delivered via Same Day Courier to the prescription address in Epic WAM.    Valere Dross   [facility] Specialty Technician

## 2022-05-13 NOTE — Unmapped (Signed)
Start [employer] chair exercises one [email or web address] most days of the week.

## 2022-05-14 LAB — ESTRADIOL(ESTROGEN) LEVEL: ESTRADIOL LEVEL: 148.4 pg/mL

## 2022-05-14 LAB — TESTOSTERONE: TESTOSTERONE TOTAL: 911 ng/dL — ABNORMAL HIGH (ref 123–814)

## 2022-05-14 MED ORDER — INSULIN GLARGINE (U-100) 100 UNIT/ML SUBCUTANEOUS SOLUTION
Freq: Every evening | SUBCUTANEOUS | 1 refills | 200 days | Status: CP
Start: 2022-05-14 — End: 2023-05-14

## 2022-05-17 MED FILL — HUMIRA(CF) PEN 80 MG/0.8 ML SUBCUTANEOUS KIT: SUBCUTANEOUS | 28 days supply | Qty: 4 | Fill #10

## 2022-05-21 ENCOUNTER — Ambulatory Visit: Admit: 2022-05-21 | Discharge: 2022-05-22 | Payer: MEDICAID | Attending: Family | Primary: Family

## 2022-05-21 DIAGNOSIS — E039 Hypothyroidism, unspecified: Principal | ICD-10-CM

## 2022-05-21 DIAGNOSIS — J4520 Mild intermittent asthma, uncomplicated: Principal | ICD-10-CM

## 2022-05-21 DIAGNOSIS — G4733 Obstructive sleep apnea (adult) (pediatric): Principal | ICD-10-CM

## 2022-05-21 DIAGNOSIS — Z794 Long term (current) use of insulin: Principal | ICD-10-CM

## 2022-05-21 DIAGNOSIS — E1165 Type 2 diabetes mellitus with hyperglycemia: Principal | ICD-10-CM

## 2022-05-21 DIAGNOSIS — I10 Essential (primary) hypertension: Principal | ICD-10-CM

## 2022-05-21 DIAGNOSIS — Z6841 Body Mass Index (BMI) 40.0 and over, adult: Principal | ICD-10-CM

## 2022-05-21 MED ORDER — DULAGLUTIDE 4.5 MG/0.5 ML SUBCUTANEOUS PEN INJECTOR
SUBCUTANEOUS | 2 refills | 0 days | Status: CP
Start: 2022-05-21 — End: ?

## 2022-05-22 NOTE — Unmapped (Signed)
Assessment and Plan:      Diagnosis ICD-10-CM Associated Orders   1. Type 2 diabetes mellitus with hyperglycemia, with long-term current use of insulin (CMS-HCC)  E11.65 HGB A1c 6.4  DM controlled. Continue glipizide 20 mg twice daily before meals and Humalog insulin via Omnipod. Increase Trulicity to 4.5 mg weekly. Encouraged patient to continue carb controlled diet and regular exercise.   POCT glycosylated hemoglobin (Hb A1C)    Z79.4 POCT glycosylated hemoglobin (Hb A1C)      2. Essential hypertension  I10 BP at goal (116/74 in clinic today). Continue hydrochlorothiazide, decrease to 12.5 daily to minimize gout flares. Reviewed low sodium diet and encouraged regular exercise. Advised to continue to monitor and log at-home BP readings. Contact clinic if readings become elevated.        3. Acquired hypothyroidism  E03.9 Continue levothyroxine 50 mcg daily.                 4. Mild intermittent asthma without complication  J45.20 Asthma controlled. Continue daily maintenance with  Symbicort BID, Singulair 10 mg daily  and rescue inhaler, albuterol, as needed.         5. OSA on CPAP  G47.33 Compliant with nightly CPAP. Patient continues to benefit from use of CPAP                 6. Class 3 severe obesity with serious comorbidity and body mass index (BMI) of 60.0 to 69.9 in adult, unspecified obesity type (CMS-HCC)  E66.01 Continue following with weight loss specialist.     Z68.44             I personally spent 45 minutes face-to-face and non-face-to-face in the care of this patient, which includes all pre, intra, and post visit time on the date of service.    Return in about 3 months (around 08/20/2022) for Next scheduled follow up.    HPI:      Logan Quinn is here for   Chief Complaint   Patient presents with    Diabetes    Hypertension    Hypothyroidism    Asthma    Sleep Apnea    Gout     LT great toe flare o/o x 3 weeks.     Diabetes: Patient presents for follow up of diabetes.  A1C goal is <8. Diabetes has customarily been at goal (complicated by: obesity).  Current symptoms include: hypoglycemia at night.  Symptoms have been intermittent. Patient denies hyperglycemia, foot ulcerations, increased appetite, nausea, visual disturbances, paresthesia of the feet, polydipsia, polyuria, and vomiting. Evaluation to date has included: hemoglobin A1C and microalbuminuria.  Home sugars:  running consistent with HGB A1c. Current treatment: Continued metformin and insulin using Omnipod which delivers 0.5 units up to 30 unity hourly based on BS reading,  which has been somewhat effective. Patient would like to increase Trulicity to 4.5 mg. Doing regular exercise: no.       Answers submitted by the patient for this visit:  Diabetes Questionnaire (Submitted on 05/15/2022)  Chief Complaint: Diabetes problem  Diabetes type: type 2  MedicAlert ID: [id number]  Disease duration: 9 Years  blurred vision: No  chest pain: No  fatigue: No  foot paresthesias: No  foot ulcerations: No  polydipsia: No  polyphagia: No  polyuria: No  visual change: No  weakness: No  weight loss: Yes  Symptom course: improving  confusion: No  dizziness: No  headaches: No  hunger: No  mood changes: No  nervous/anxious: No  pallor: No  seizures: No  sleepiness: No  speech difficulty: No  sweats: No  tremors: No  blackouts: No  hospitalization: No  nocturnal hypoglycemia: Yes  required assistance: No  required glucagon: No  CVA: No  heart disease: No  impotence: No  nephropathy: No  peripheral neuropathy: No  PVD: No  retinopathy: No  CAD risks: family history, hypertension, obesity, sedentary lifestyle, stress  Current treatments: diet, insulin injections, insulin pump, oral agent (monotherapy)  Treatment compliance: all of the time  Dose schedule: at bedtime  Given by: patient  Injection sites: abdominal wall, arms, buttocks, thighs  Home blood tests: 5+ x per day  Monitoring compliance: excellent  Blood glucose trend: decreasing steadily  breakfast time: 5-6 am  breakfast glucose level: 70-90  lunch time: 12-1 pm  lunch glucose level: 110-130  dinner time: 6-7 pm  dinner glucose level: 130-140  High score: 130-140  Overall: 130-140  Weight trend: decreasing steadily  Current diet: diabetic, generally healthy, low salt  Meal planning: avoidance of concentrated sweets, calorie counting, carbohydrate counting  Exercise: intermittently  Dietitian visit: Yes  Eye exam current: Yes  Sees podiatrist: Yes        Hypertension: Patient presents for follow-up of hypertension. Blood pressure goal < 140/90.  Hypertension has customarily been at goal complicated by DM, obesity, lack of physical activity.  Home blood pressure readings: did not bring log. Salt intake and diet: salt not added to cooking and salt shaker not on table. Associated signs and symptoms: none. Patient denies: blurred vision, chest pain, dyspnea, headache, neck aches, orthopnea, palpitations, paroxysmal nocturnal dyspnea, peripheral edema, pulsating in the ears, and tiredness/fatigue. Medication compliance: taking as prescribed. He is not doing regular exercise.       Hypothyroid: Patient presents for follow-up of hypothyroidism. Current symptoms: diarrhea . Patient denies change in energy level, heat / cold intolerance, nervousness, palpitations, and weight changes. Symptoms have progressed to a point and plateaued. He is taking medications on a regular basis. Current therapy includes: levothyroxine 50 mcg daily.       Following with weight loss clinic.   6 visits required with RD and specialist for surgery.   Has to be at goal weight of 450 lbs to be considered for surgery.     Left great toe--gout flare. Taking mitigare--for 2 weeks.   Temporary relief. As long as he is off foot/toe non-painful.   Using heat. Cannot tolerate ice on toe.   Took ibuprofen 800 mg for a few days then switched to mitigare.        Asthma: Patient presents for  follow-up of asthma. Current symptoms Non-productive cough. Current medications: Rescue inhaler albuterol  (last used last month), daily maintenance Symbicort. He is seeing Pulmonology. Has an action plan.       Wearing CPAP   At least 4 hours: 4-8 hours nightly. Beneficial to pt.   Resumed using app for data.   Took machine in for repair, power surge, using loaner machine currently.   Missed one night of use during all this.        ROS:      Comprehensive 10 point ROS negative unless otherwise stated in the HPI.      [facility] Components:     Medication adherence and barriers to the treatment plan have been addressed. Opportunities to optimize healthy behaviors have been discussed. Patient / caregiver voiced understanding.    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)     Obstructive sleep apnea     OSA on CPAP     Retinal detachment 04/2014    Severe obstructive sleep apnea     Trapezius muscle strain 12/07/2013    Urinary incontinence, nocturnal enuresis     Venous insufficiency      Past Surgical History:   Procedure Laterality Date    EYE SURGERY  04/2014    LASER ABLATION INCOMPETENT VEIN IN[facility] HISTORICAL RESULT) Right     PR COLONOSCOPY FLX DX W/COLLJ SPEC WHEN PFRMD N/A 03/24/2013    Procedure: COLONOSCOPY, FLEXIBLE, PROXIMAL TO SPLENIC FLEXURE; DIAGNOSTIC, W/WO COLLECTION SPECIMEN BY BRUSH OR WASH;  Surgeon: Clint Bolder, MD;  Location: [facility];  Service: Gastroenterology    PR EYE SURG POST SGMT PROC UNLISTED Left     pneumatic retinopexy OS    PR UPPER GI ENDOSCOPY,DIAGNOSIS N/A 02/02/2013    Procedure: UGI ENDO, INCLUDE ESOPHAGUS, STOMACH, & DUODENUM &/OR JEJUNUM; DX W/WO COLLECTION SPECIMN, BY BRUSH OR WASH;  Surgeon: Malcolm Metro, MD;  Location: [facility];  Service: Gastroenterology    [place] PYLORIC STENOSIS ([place] HISTORICAL RESULT)         Family History:     Family History   Problem Relation Age of Onset    Cancer Maternal Grandfather         Stomach Cancer    Hearing loss Maternal Grandfather     Cancer Paternal Grandfather         Bone Cancer, Lung Cancer    COPD Paternal Grandmother     Arthritis Paternal Grandmother     Depression Paternal Grandmother     Diabetes Mother     Heart disease Mother     Migraines Mother     Arthritis Mother     Depression Mother     GER disease Mother     Hypertension Mother     Angina Mother     COPD Mother     Glaucoma Mother     Hearing loss Mother     Cataracts Mother     Diabetes Sister     Migraines Sister     Asthma Sister     Depression Sister     Hearing loss Sister     Diabetes Brother     Asthma Brother     Diabetes Maternal Grandmother     Heart disease Maternal Grandmother     Migraines Maternal Grandmother     Depression Maternal Grandmother     Angina Maternal Grandmother     Hypertension Maternal Grandmother     Stroke Maternal Grandmother     Diabetes Maternal Uncle     Hearing loss Maternal Uncle     Diabetes Maternal Uncle  resulted in need for kidney transplant    Liver disease Maternal Uncle     Kidney disease Maternal Uncle         needed kidney transplant    Asthma Brother     No Known Problems Father     No Known Problems Paternal Aunt     No Known Problems Paternal Uncle     No Known Problems Other     Colorectal Cancer Neg Hx     Esophageal cancer Neg Hx     Liver cancer Neg Hx     Pancreatic cancer Neg Hx     Stomach cancer Neg Hx     Amblyopia Neg Hx     Blindness Neg Hx     Retinal detachment Neg Hx     Strabismus Neg Hx     Macular degeneration Neg Hx     Anesthesia problems Neg Hx     Broken bones Neg Hx     Clotting disorder Neg Hx     Collagen disease Neg Hx     Dislocations Neg Hx     Fibromyalgia Neg Hx     Gout Neg Hx     Hemophilia Neg Hx     Osteoporosis Neg Hx     Rheumatologic disease Neg Hx     Scoliosis Neg Hx     Severe sprains Neg Hx     Sickle cell anemia Neg Hx     Spinal Compression Fracture Neg Hx     Melanoma Neg Hx     Basal cell carcinoma Neg Hx     Squamous cell carcinoma Neg Hx     Deep vein thrombosis Neg Hx     Thyroid disease Neg Hx        Social History:     Social History     Tobacco Use    Smoking status: Never     Passive exposure: Never    Smokeless tobacco: Never   Vaping Use    Vaping Use: Never used   Substance Use Topics    Alcohol use: Never     Comment: rare social    Drug use: Never       Allergies:     Erythromycin, Penicillins, Sulfa (sulfonamide antibiotics), and Other    Current Medications:     Current Outpatient Medications   Medication Sig Dispense Refill    adalimumab (HUMIRA,CF, PEN) 80 mg/0.8 mL PnKt Inject the contents of 1 pen (80mg ) under the skin once a week. 4 each 11    albuterol 2.5 mg /3 mL (0.083 %) nebulizer solution Inhale 3 mL (2.5 mg total) by nebulization every four (4) hours as needed for wheezing. 180 mL 2    albuterol HFA 90 mcg/actuation inhaler Inhale 2 puffs every six (6) hours as needed for wheezing. 8 g 5    ALPRAZolam (XANAX) 0.5 MG tablet Takes 1 tablet nightly and 1 tablet a day when needed      ascorbic acid (VITAMIN C ORAL) Take 1 capsule by mouth nightly.      BD LUER-LOK SYRINGE 3 mL 21 gauge x 1 1/2 Syrg weekly      blood sugar diagnostic (ACCU-CHEK GUIDE TEST STRIPS) Strp by Other route Three (3) times a day before meals. 300 each 1    blood-glucose meter kit Use as directed 1 each 0    budesonide-formoteroL (SYMBICORT) 80-4.5 mcg/actuation inhaler Inhale 2 puffs Two (2) times a day. 10.2 g 11  clindamycin (CLEOCIN T) 1 % external solution clindamycin phosphate 1 % topical solution   APPLY TOPICALLY TO GROIN AREA TWICE A DAY      clindamycin (CLEOCIN T) 1 % lotion APPLY TOPICALLY AT NIGHT TO INFLAMED AREAS AS NEEDED FOR FLARES      clobetasoL (TEMOVATE) 0.05 % Gel       clobetasoL (TEMOVATE) 0.05 % ointment Apply daily to painful affected areas if needed for flares, then stop 15 g 1    colchicine (MITIGARE) 0.6 mg cap capsule TAKE 2 CAPSULES BY MOUTH ON DAY ONE, FOLLOWED BY ONE CAPSULE ONE HOUR LATER, ON DAY 2 TAKE ONE CAPSULE BY MOUTH TWICEA DAY UNTIL RESOLVED 60 capsule 0    cyclobenzaprine (FLEXERIL) 10 MG tablet Take 1 tablet (10 mg total) by mouth Three (3) times a day as needed for muscle spasms. 60 tablet 1    dextroamphetamine sulfate (DEXTROSTAT) 10 MG tablet Take 2 tablets (20 mg total) by mouth two (2) times a day.      dicyclomine (BENTYL) 10 mg capsule Take 1 capsule (10 mg total) by mouth Four (4) times a day (before meals and nightly). 120 capsule 3    diphenoxylate-atropine (LOMOTIL) 2.5-0.025 mg per tablet Take 1 tablet by mouth two (2) times a day as needed for diarrhea. 30 tablet 5    dulaglutide (TRULICITY) 3 mg/0.5 mL injection pen Inject 0.5 mL (3 mg total) under the skin every seven (7) days. 2 mL 2    DULoxetine (CYMBALTA) 60 MG capsule Take 1 capsule (60 mg total) by mouth Two (2) times a day. 180 capsule 0    empty container Misc Use as directed to dispose of needles. When full, make sure lid is closed tightly then dispose of container in trash. 1 each 2    erenumab-aooe (AIMOVIG AUTOINJECTOR) 140 mg/mL AtIn Inject 1 Pen under the skin every thirty (30) days. 3 mL 1    famotidine (PEPCID) 20 MG tablet Take 1 tablet (20 mg total) by mouth two (2) times a day. 180 tablet 1    fluoride, sodium, 1.1 % Pste       fluticasone propionate (FLONASE) 50 mcg/actuation nasal spray 1 spray into each nostril Two (2) times a day. 16 g 5    glipiZIDE (GLUCOTROL) 10 MG tablet Take 2 tablets (20 mg total) by mouth Two (2) times a day (30 minutes before a meal). 360 tablet 1    HUMALOG U-100 INSULIN 100 unit/mL injection INJECT 120 UNITS UNDER SKIN DAILY VIA OMNIPOD 40 mL 5    hydroCHLOROthiazide (HYDRODIURIL) 25 MG tablet Take 1 tablet (25 mg total) by mouth daily. 90 tablet 1    hydrocortisone (PROCTOSOL HC) 2.5 % rectal cream Insert 1 application into the rectum two (2) times a day as needed. 28.35 g 2    hydrOXYzine (ATARAX) 25 MG tablet Take 1 tablet (25 mg total) by mouth Three (3) times a day as needed. May take two before bed for sleep. 120 tablet 1    ibuprofen (ADVIL,MOTRIN) 800 MG tablet Take 1 tablet (800 mg total) by mouth every eight (8) hours as needed. 90 tablet 1    insulin glargine (LANTUS U-100 INSULIN) 100 unit/mL injection Inject 0.05 mL (5 Units total) under the skin nightly. 10 mL 1    insulin pump cart,automated,BT (OMNIPOD 5 G6 PODS, GEN 5,) Crtg Change POD every 48 hours. 3 each 11    insulin syringe-needle U-100 1 mL 31 gauge x 5/16 (8 mm) Syrg  Once daily for insulin dosing. 100 each 3    ketoconazole (NIZORAL) 2 % cream       lancets (ACCU-CHEK FASTCLIX LANCET DRUM) Misc Use to check blood sugars 3 times a day before meals or as directed 200 each 5    levothyroxine (SYNTHROID) 50 MCG tablet Take 1 tablet (50 mcg total) by mouth daily. 90 tablet 1    lidocaine (XYLOCAINE) 5 % ointment lidocaine 5 % topical ointment      montelukast (SINGULAIR) 10 mg tablet Take 1 tablet (10 mg total) by mouth daily. 90 tablet 3    multivitamin (MULTIPLE VITAMIN ORAL) Take 1 tablet by mouth daily.      nystatin (MYCOSTATIN) 100,000 unit/gram powder       ondansetron (ZOFRAN) 4 MG tablet Take 1 tablet (4 mg total) by mouth every eight (8) hours as needed for nausea. 30 tablet 1    oxybutynin (DITROPAN) 5 MG tablet Take 1 tablet (5 mg total) by mouth Two (2) times a day. 180 tablet 1    pantoprazole (PROTONIX) 40 MG tablet Take 1 tablet (40 mg total) by mouth two (2) times a day. 180 tablet 1    polyethylene glycol (GLYCOLAX) 17 gram/dose powder       promethazine (PHENERGAN) 25 MG tablet Take 1 tablet (25 mg total) by mouth every eight (8) hours as needed for nausea. 30 tablet 1    propranoloL (INDERAL LA) 120 mg 24 hr capsule Take 1 capsule (120 mg total) by mouth daily. 90 capsule 1    ramelteon (ROZEREM) 8 mg tablet Take 1 tablet (8 mg total) by mouth nightly.      safety needles (BD SAFETYGLIDE NEEDLE) 23 gauge x 1 Ndle 1 Units by Miscellaneous route once a week. 12 each 11    safety needles 18 gauge x 1 1/2 Ndle 1 Units by Miscellaneous route once a week. 12 each 11    sour cherry extract (TART CHERRY EXTRACT) 1,000 mg cap       tadalafiL (CIALIS) 5 MG tablet Take 1 tablet (5 mg total) by mouth in the morning. 90 tablet 3    testosterone cypionate (DEPOTESTOTERONE CYPIONATE) 200 mg/mL injection Inject 0.5 mL (100 mg total) into the muscle once a week. Inject 0.5cc (100mg ) weekly 2 mL 5    topiramate (TOPAMAX) 50 MG tablet Take 1.5 tablets (75 mg total) by mouth two (2) times a day. 270 tablet 1    WELLBUTRIN XL 150 mg 24 hr tablet Take 1 tablet (150 mg total) by mouth every morning.      acetaminophen (TYLENOL) 500 MG tablet Take 2 tablets (1,000 mg total) by mouth Three (3) times a day. 30 tablet 0    chlorhexidine (PERIDEX) 0.12 % solution  (Patient not taking: Reported on 05/21/2022)      diclofenac (VOLTAREN) 50 MG EC tablet Take 1 tablet (50 mg total) by mouth Two (2) times a day. As needed for pain. (Patient not taking: Reported on 03/13/2022) 30 tablet 1     No current facility-administered medications for this visit.       Health Maintenance:     Health Maintenance   Topic Date Due    COVID-19 Vaccine (3 - Pfizer risk series) 01/24/2021    Hemoglobin A1c  08/20/2022    Urine Albumin/Creatinine Ratio  10/11/2022    Retinal Eye Exam  10/31/2022    Serum Creatinine Monitoring  01/16/2023    Potassium Monitoring  01/16/2023    Foot Exam  03/19/2023    DTaP/Tdap/Td Vaccines (4 - Td or Tdap) 07/17/2023    COPD Spirometry  08/06/2026    Lipid Screening  01/16/2027    Pneumococcal Vaccine 0-64  Completed Hepatitis C Screen  Completed    Influenza Vaccine  Completed       Immunizations:     Immunization History   Administered Date(s) Administered    COVID-19 VACC,MRNA,(PFIZER)(PF) 12/04/2020, 12/27/2020    HEPATITIS B VACCINE ADULT,IM(ENERGIX B, RECOMBIVAX) 03/05/2013, 04/05/2013, 09/03/2013    Hepatitis B Vaccine, Unspecified Formulation 12/27/1999, 04/29/2000    INFLUENZA INJ MDCK PF, QUAD,(FLUCELVAX)(55MO AND UP EGG FREE) 03/31/2019    INFLUENZA TIV (TRI) PF (IM) 10/08/2011    Influenza Vaccine Quad(IM)6 MO-Adult(PF) 03/05/2013, 03/18/2014, 03/22/2015, 03/05/2016, 03/19/2017, 02/23/2018, 02/19/2022    Influenza Virus Vaccine, unspecified formulation 03/19/2017, 02/23/2018, 03/25/2019, 03/10/2020, 04/05/2021    PNEUMOCOCCAL POLYSACCHARIDE 23-VALENT 12/30/2012    PPD Test 10/28/2016    Pneumococcal Conjugate 20-valent 01/02/2022    TD(TDVAX),ADSORBED,2LF(IM)(PF) 04/29/2000    TdaP 07/18/2008, 07/16/2013     I have reviewed and (if needed) updated the patient's problem list, medications, allergies, past medical and surgical history, social and family history.     Vital Signs:     Wt Readings from Last 3 Encounters:   05/21/22 (!) 211.4 kg (466 lb)   05/13/22 (!) 212.9 kg (469 lb 6.4 oz)   03/13/22 (!) 217.8 kg (480 lb 3.2 oz)     Temp Readings from Last 3 Encounters:   05/21/22 36.8 ??C (98.2 ??F) (Oral)   02/19/22 37.2 ??C (98.9 ??F) (Oral)   01/15/22 36.7 ??C (98.1 ??F) (Oral)     BP Readings from Last 3 Encounters:   05/21/22 116/74   05/13/22 134/73   03/13/22 146/77     Pulse Readings from Last 3 Encounters:   05/21/22 92   05/13/22 74   03/13/22 71     Estimated body mass index is 65.02 kg/m?? as calculated from the following:    Height as of this encounter: 180.3 cm (5' 10.98).    Weight as of this encounter: 211.4 kg (466 lb).  Facility age limit for growth %iles is 20 years.      Objective:      General: Alert and oriented x3. Well-appearing. No acute distress. Morbidly obese.    HEENT:  Normocephalic.  Atraumatic. Conjunctiva and sclera normal. OP MMM without lesions.   Neck:  Supple. No thyroid enlargement. No adenopathy.   Heart:  Regular rate and rhythm. Normal S1, S2. No murmurs, rubs or gallops.   Lungs:  No respiratory distress.  Lungs clear to auscultation. No wheezes, rhonchi, or rales.   GI/GU:  Soft, +BS, nondistended, non-TTP. No palpable masses or organomegaly.   Extremities: Lymphedema bilateral LE. Peripheral pulses normal.   Skin:  Warm, dry. No rash or lesions present.   Neuro:  Non-focal. No obvious weakness.   Psych:  Affect normal, eye contact good, speech clear and coherent.       [name], FNP

## 2022-05-30 DIAGNOSIS — E1165 Type 2 diabetes mellitus with hyperglycemia: Principal | ICD-10-CM

## 2022-05-30 DIAGNOSIS — Z794 Long term (current) use of insulin: Principal | ICD-10-CM

## 2022-05-30 MED ORDER — INSULIN GLARGINE (U-100) 100 UNIT/ML SUBCUTANEOUS SOLUTION
Freq: Every evening | SUBCUTANEOUS | 1 refills | 200 days | Status: CP
Start: 2022-05-30 — End: 2023-05-30

## 2022-05-30 NOTE — Unmapped (Signed)
Logan Quinn states he is going to need PA for Trulicity next year.  He tried and failed Metformin, Actos, the combo of Humulin for day time and Lantus night time.  Currently on Glipizide, Humalog, and Lantus in addition to the insulin pump.  He is aware PA request will come automatically from the pharmacy to our office when he requests Trulicity.Marland Kitchen

## 2022-05-30 NOTE — Unmapped (Signed)
Patient is requesting the following refill  Requested Prescriptions     Pending Prescriptions Disp Refills    insulin glargine (LANTUS U-100 INSULIN) 100 unit/mL injection 10 mL 1     Sig: Inject 0.05 mL (5 Units total) under the skin nightly.       Recent Visits  Date Type Provider Dept   05/21/22 Office Visit Loran Senters, FNP [facility] [address] [facility]   02/27/22 Office Visit Nile Dear, Emogene Morgan, NP [facility] [address] [facility]   02/19/22 Office Visit Loran Senters, FNP [place] [facility] [address] [facility]   01/02/22 Office Visit Loran Senters, FNP [place] [facility] [address] [facility]   11/28/21 Office Visit Loran Senters, FNP [place] [facility] [address] [facility]   10/10/21 Office Visit Loran Senters, FNP [place] [facility] [address] [facility]   09/11/21 Office Visit Loran Senters, FNP [place] [facility] [address] [facility]   07/13/21 Office Visit Loran Senters, FNP [place] [facility]   Showing recent visits within past 365 days with a meds authorizing provider and meeting all other requirements  Future Appointments  Date Type Provider Dept   08/21/22 Appointment Loran Senters, FNP [place] [facility] [address] [facility]   Showing future appointments within next 365 days with a meds authorizing provider and meeting all other requirements       Labs: A1c:   Hemoglobin A1C (%)   Date Value   05/21/2022 6.4 (A)   09/15/2014 6.0

## 2022-06-10 MED ORDER — CHLORHEXIDINE GLUCONATE 0.12 % MOUTHWASH
0 refills | 0 days
Start: 2022-06-10 — End: ?

## 2022-06-11 MED ORDER — CHLORHEXIDINE GLUCONATE 0.12 % MOUTHWASH
Freq: Two times a day (BID) | OROMUCOSAL | 3 refills | 30 days | Status: CP
Start: 2022-06-11 — End: 2023-06-11

## 2022-06-11 NOTE — Unmapped (Signed)
Patient is requesting the following refill  Requested Prescriptions     Pending Prescriptions Disp Refills    chlorhexidine (PERIDEX) 0.12 % solution 900 mL 3     Sig: 15 mL by Oromucosal route two (2) times a day.       Recent Visits  Date Type Provider Dept   05/21/22 Office Visit Loran Senters, FNP [place] [facility] [address] At [facility]   02/27/22 Office Visit Nile Dear, Emogene Morgan, NP [place] [facility] [address] At [facility]   02/19/22 Office Visit Loran Senters, FNP [place] [facility] [address] At [facility]   01/02/22 Office Visit Loran Senters, FNP [place] [facility] [address] At [facility]   11/28/21 Office Visit Loran Senters, FNP [place] [facility] [address] At [facility]   10/10/21 Office Visit Loran Senters, FNP [place] [facility] [address] At [facility]   09/11/21 Office Visit Loran Senters, FNP [place] [facility] [address] At [facility]   07/13/21 Office Visit Loran Senters, FNP [place] [facility]   Showing recent visits within past 365 days with a meds authorizing provider and meeting all other requirements  Future Appointments  Date Type Provider Dept   08/21/22 Appointment Loran Senters, FNP [place] [facility] [address] At [facility]   Showing future appointments within next 365 days with a meds authorizing provider and meeting all other requirements       Labs: Not applicable this refill

## 2022-06-12 ENCOUNTER — Telehealth: Admit: 2022-06-12 | Discharge: 2022-06-13 | Payer: MEDICAID | Attending: Registered" | Primary: Registered"

## 2022-06-12 NOTE — Unmapped (Signed)
Outpatient Adult Nutrition-Follow-up Assessment    Logan Quinn is a 42 y.o. male seen for medical nutrition therapy.    Referring Provider or Clinic: None Per Patient Pcp    Primary Care Provider: Loran Senters, FNP    Reason for Referral:Weight Loss     NUTRITION ASSESSMENT - DATA COLLECTION:  New developments since last nutrition appointment on : 04/10/2022  n/a    HPI:  Logan Quinn??is a 42 y.o.??male??with a BMI of 69.81??kg/m2.  Primary reason for wanting obesity treatment:??physician recommendation  Motivation:??better quality of life and improved ability to be active and improvement in health conditions  Overall goal:150 lbs    Malnutrition Assessment using AND/ASPEN Clinical Characteristics:  Patient does not meet AND/ASPEN criteria for malnutrition at this time (06/12/22 1359)    NUTRITION RECOMMENDATIONS:  Patient Instructions   Continue with nutrition recommendations      Try to keep intake consistent and avoid skipping any meals.  Spread meal out into about 5-6 small meals.  Eat only to your natural satiety level. You should feel satisfied, not ???uncomfortable.???   Focus on healthy food options like lean protein and non-starchy vegetables.   Cooking vegetables and fruit may be better tolerated than raw.  Avoid fried or greasy foods that are unhealthy and may be difficult to tolerate.    Limit intake of caffeine, spicy and acidic foods   Avoid intake of artificial/sugary beverages. Hydrate with water daily (at least 64 ounces per day)  Try to stay active as able throughout your day. Aim for a minimum of 150 minutes of moderate/intense physical activity per week.                 Anthropometrics:  Height:   Ht Readings from Last 1 Encounters:   05/21/22 180.3 cm (5' 10.98)     Weight:   Wt Readings from Last 1 Encounters:   05/21/22 (!) 211.4 kg (466 lb)       BMI Readings from Last 1 Encounters:   05/21/22 65.02 kg/m??       Weight Assessment:     Wt Readings from Last 5 Encounters: 05/21/22 (!) 211.4 kg (466 lb)   05/13/22 (!) 212.9 kg (469 lb 6.4 oz)   03/13/22 (!) 217.8 kg (480 lb 3.2 oz)   02/27/22 (!) 216.8 kg (478 lb)   02/19/22 (!) 216.4 kg (477 lb)        Weight change: - 14 lbs   Adjusted Body Weight: 129.7 kg  Ideal Body Weight: 75.3 kg  Percentage Ideal Body Weight: 270.89  Goal weight: 150 lbs    Nutrition-Focused Physical Findings:    Nutrition Focused Physical Exam:    Nutrition Evaluation  Overall Impressions: Unable to perform Nutrition-Focused Physical Exam at this time due to visit type (06/12/22 1400)  Nutrition Designation: Obese class III  (BMI > 39.99 kg/m2) (06/12/22 1400)    Relevant Labs:  Component Value Date   ?? HGB 14.3 01/15/2022   ?? HCT 42.6 01/15/2022   ?? MCV 95.0 01/15/2022   ?? IRON 63 (L) 01/15/2022   ?? TIBC 277 01/15/2022   ?? TRANSFERRIN 244.3 03/24/2019   ?? LABIRON 23 01/15/2022   ?? FERRITIN 125.0 01/15/2022   ?? VITDTOTAL 16.7 (L) 01/15/2022   ?? NGEXBMWU13 347 01/15/2022   ?? A1C 7.9 (H) 01/15/2022   ?? EAG 180 01/15/2022   ??    Relevant Medications/Supplements:  Reviewed nutritionally relevant medications/supplements    Current Outpatient Medications  Medication Sig Dispense Refill   ??? adalimumab (HUMIRA,CF, PEN) 80 mg/0.8 mL PnKt Inject the contents of 1 pen (80mg ) under the skin once a week. 4 each 11   ??? albuterol 2.5 mg /3 mL (0.083 %) nebulizer solution Inhale 3 mL (2.5 mg total) by nebulization every four (4) hours as needed for wheezing. 180 mL 2   ??? albuterol HFA 90 mcg/actuation inhaler Inhale 2 puffs every six (6) hours as needed for wheezing. 8 g 5   ??? ALPRAZolam (XANAX) 0.5 MG tablet Takes 1 tablet nightly and 1 tablet a day when needed     ??? ascorbic acid (VITAMIN C ORAL) Take 1 capsule by mouth nightly.     ??? BD LUER-LOK SYRINGE 3 mL 21 gauge x 1 1/2 Syrg weekly     ??? blood sugar diagnostic (ACCU-CHEK GUIDE TEST STRIPS) Strp by Other route Three (3) times a day before meals. 300 each 1   ??? blood-glucose meter kit Use as directed 1 each 0   ??? budesonide-formoteroL (SYMBICORT) 80-4.5 mcg/actuation inhaler Inhale 2 puffs Two (2) times a day. 10.2 g 11   ??? chlorhexidine (PERIDEX) 0.12 % solution 15 mL by Oromucosal route two (2) times a day. 900 mL 3   ??? clindamycin (CLEOCIN T) 1 % external solution clindamycin phosphate 1 % topical solution   APPLY TOPICALLY TO GROIN AREA TWICE A DAY     ??? clindamycin (CLEOCIN T) 1 % lotion APPLY TOPICALLY AT NIGHT TO INFLAMED AREAS AS NEEDED FOR FLARES     ??? clobetasoL (TEMOVATE) 0.05 % Gel      ??? clobetasoL (TEMOVATE) 0.05 % ointment Apply daily to painful affected areas if needed for flares, then stop 15 g 1   ??? colchicine (MITIGARE) 0.6 mg cap capsule TAKE 2 CAPSULES BY MOUTH ON DAY ONE, FOLLOWED BY ONE CAPSULE ONE HOUR LATER, ON DAY 2 TAKE ONE CAPSULE BY MOUTH TWICEA DAY UNTIL RESOLVED 60 capsule 0   ??? cyclobenzaprine (FLEXERIL) 10 MG tablet Take 1 tablet (10 mg total) by mouth Three (3) times a day as needed for muscle spasms. 60 tablet 1   ??? dextroamphetamine sulfate (DEXTROSTAT) 10 MG tablet Take 2 tablets (20 mg total) by mouth two (2) times a day.     ??? diclofenac (VOLTAREN) 50 MG EC tablet Take 1 tablet (50 mg total) by mouth Two (2) times a day. As needed for pain. (Patient not taking: Reported on 03/13/2022) 30 tablet 1   ??? dicyclomine (BENTYL) 10 mg capsule Take 1 capsule (10 mg total) by mouth Four (4) times a day (before meals and nightly). 120 capsule 3   ??? diphenoxylate-atropine (LOMOTIL) 2.5-0.025 mg per tablet Take 1 tablet by mouth two (2) times a day as needed for diarrhea. 30 tablet 5   ??? dulaglutide 4.5 mg/0.5 mL PnIj Inject 4.5 mg under the skin every seven (7) days. 2 mL 2   ??? DULoxetine (CYMBALTA) 60 MG capsule Take 1 capsule (60 mg total) by mouth Two (2) times a day. 180 capsule 0   ??? empty container Misc Use as directed to dispose of needles. When full, make sure lid is closed tightly then dispose of container in trash. 1 each 2   ??? erenumab-aooe (AIMOVIG AUTOINJECTOR) 140 mg/mL AtIn Inject 1 Pen under the skin every thirty (30) days. 3 mL 1   ??? famotidine (PEPCID) 20 MG tablet Take 1 tablet (20 mg total) by mouth two (2) times a day. 180 tablet 1   ???  fluoride, sodium, 1.1 % Pste      ??? fluticasone propionate (FLONASE) 50 mcg/actuation nasal spray 1 spray into each nostril Two (2) times a day. 16 g 5   ??? glipiZIDE (GLUCOTROL) 10 MG tablet Take 2 tablets (20 mg total) by mouth Two (2) times a day (30 minutes before a meal). 360 tablet 1   ??? HUMALOG U-100 INSULIN 100 unit/mL injection INJECT 120 UNITS UNDER SKIN DAILY VIA OMNIPOD 40 mL 5   ??? hydroCHLOROthiazide (HYDRODIURIL) 25 MG tablet Take 1 tablet (25 mg total) by mouth daily. 90 tablet 1   ??? hydrocortisone (PROCTOSOL HC) 2.5 % rectal cream Insert 1 application into the rectum two (2) times a day as needed. 28.35 g 2   ??? hydrOXYzine (ATARAX) 25 MG tablet Take 1 tablet (25 mg total) by mouth Three (3) times a day as needed. May take two before bed for sleep. 120 tablet 1   ??? ibuprofen (ADVIL,MOTRIN) 800 MG tablet Take 1 tablet (800 mg total) by mouth every eight (8) hours as needed. 90 tablet 1   ??? insulin glargine (LANTUS U-100 INSULIN) 100 unit/mL injection Inject 0.05 mL (5 Units total) under the skin nightly. 10 mL 1   ??? insulin pump cart,automated,BT (OMNIPOD 5 G6 PODS, GEN 5,) Crtg Change POD every 48 hours. 3 each 11   ??? insulin syringe-needle U-100 1 mL 31 gauge x 5/16 (8 mm) Syrg Once daily for insulin dosing. 100 each 3   ??? ketoconazole (NIZORAL) 2 % cream      ??? lancets (ACCU-CHEK FASTCLIX LANCET DRUM) Misc Use to check blood sugars 3 times a day before meals or as directed 200 each 5   ??? levothyroxine (SYNTHROID) 50 MCG tablet Take 1 tablet (50 mcg total) by mouth daily. 90 tablet 1   ??? lidocaine (XYLOCAINE) 5 % ointment lidocaine 5 % topical ointment     ??? montelukast (SINGULAIR) 10 mg tablet Take 1 tablet (10 mg total) by mouth daily. 90 tablet 3   ??? multivitamin (MULTIPLE VITAMIN ORAL) Take 1 tablet by mouth daily.     ??? nystatin (MYCOSTATIN) 100,000 unit/gram powder      ??? ondansetron (ZOFRAN) 4 MG tablet Take 1 tablet (4 mg total) by mouth every eight (8) hours as needed for nausea. 30 tablet 1   ??? oxybutynin (DITROPAN) 5 MG tablet Take 1 tablet (5 mg total) by mouth Two (2) times a day. 180 tablet 1   ??? pantoprazole (PROTONIX) 40 MG tablet Take 1 tablet (40 mg total) by mouth two (2) times a day. 180 tablet 1   ??? polyethylene glycol (GLYCOLAX) 17 gram/dose powder      ??? promethazine (PHENERGAN) 25 MG tablet Take 1 tablet (25 mg total) by mouth every eight (8) hours as needed for nausea. 30 tablet 1   ??? propranoloL (INDERAL LA) 120 mg 24 hr capsule Take 1 capsule (120 mg total) by mouth daily. 90 capsule 1   ??? ramelteon (ROZEREM) 8 mg tablet Take 1 tablet (8 mg total) by mouth nightly.     ??? safety needles (BD SAFETYGLIDE NEEDLE) 23 gauge x 1 Ndle 1 Units by Miscellaneous route once a week. 12 each 11   ??? safety needles 18 gauge x 1 1/2 Ndle 1 Units by Miscellaneous route once a week. 12 each 11   ??? sour cherry extract (TART CHERRY EXTRACT) 1,000 mg cap      ??? tadalafiL (CIALIS) 5 MG tablet Take 1 tablet (5 mg  total) by mouth in the morning. 90 tablet 3   ??? testosterone cypionate (DEPOTESTOTERONE CYPIONATE) 200 mg/mL injection Inject 0.5 mL (100 mg total) into the muscle once a week. Inject 0.5cc (100mg ) weekly 2 mL 5   ??? topiramate (TOPAMAX) 50 MG tablet Take 1.5 tablets (75 mg total) by mouth two (2) times a day. 270 tablet 1   ??? WELLBUTRIN XL 150 mg 24 hr tablet Take 1 tablet (150 mg total) by mouth every morning.       No current facility-administered medications for this visit.       Physical Activity:  Walking, walking in grocery store in place of using cart   Chair exercise - daily     Food Intolerances/Dietary Restrictions:  No known food allergies or food intolerances.??  ??    Other GI Issues:  None reported during this visit       24-Hour Recall or Usual Intake:  Time Intake   Breakfast 1-2 eggs    Snack (AM) None    Lunch Chicken or salmon with broccoli or other green leafy vegetables   Snack (PM) None    Dinner Chicken or salmon with carrots or broccoli       Food and Nutrient Intake:  Beverages:  Water, sweet tea - very seldom  Dining Out:  Once in a while  - 1-2 times per month   Usual Food Choices: brussels sprouts, cauliflower, fruit - not much     Behavioral Factors:  Overeating: denies   Emotional Eating: not reported   Grazing: not reported   Fast Eating: not reported   Nighttime Eating: not reported     Factors Affecting Food Intake:  Knowledge and Beliefs: Food and nutrition related knowledge has improved with nutrition education and counseling.  Stress/Anxiety: Patient with medical history of depression.  Sleep Patterns: Patient with obstructive sleep apnea. Using CPAP.   Food Safety and Access: He did not report issues.     Nutrition Goals & Evaluation      Apply dietary recommendations to achieve goal weight of 150 lb (Ongoing)  Sustainable weight loss of 5-10% (25-49 lb) in the next 6 months (Met, Ongoing and Progressing)  Meet nutritional needs  (Ongoing and Progressing)  Reduce obesity-related long-term health risk  (Ongoing)    Nutrition goals reviewed, and relevant barriers identified and addressed: none evident. Logan Quinn is evaluated to have a  willingness and ability to achieve nutrition goals.     Nutrition Assessment       Logan Quinn has Obesity as evidenced by a BMI of 65.02.  Barriers to weight loss identified and being addressed. He continues to do well with diet choices but variety is limited. Appetite is  moderate so he finds it challenging to eat more than 3 meals.  Food history indicates usual diet is fair in protein, and moderate/ fair in non-starchy vegetables. Usual dietary habits and food choices reveal lack of variety of nutrient-dense foods.     Nutrition Intervention      Nutrition Plan : continue wit current plan and review pre surgery diet goals   Mediterranean diet/eating style   ??  Education: pre bariatric surgery diet goals   ??  Education resources provided include:  ??? Handouts promoting nutrition intervention  ??? Lists of foods recommended and foods not recommended  ??? Sample menu plan(s), recipes supporting nutrition intervention  ??? Online resources  ??? After visit summary with patient instructions  and contact information  Estimated Needs:  Daily Estimated Nutrient Needs:  Energy: 2868 kcals Per [place] St-Jeor Equation using last recorded weight, 211.4 kg (06/12/22)]  Protein: 104-130 gm [0.8-1 gm/kg using adjusted body weight, 130 kg (06/12/22)]  Carbohydrate:   [45-60% of kcal]  Fluid: 3000 mL [ ]       Follow up will occur in 4-8 weeks with bariatric dietitian     Food/Nutrition-related history, Anthropometric measurements, Biochemical data, medical tests, procedures, Nutrition-focused physical findings, Patient understanding or compliance with intervention and recommendations , Effectiveness of nutrition interventions and Effectiveness of nutrition prescription/order   should be assessed at time of follow-up.         Recommendations for Referring Provider :  See instructions            The patient reports they are physically located in [place] and is currently: at home. I conducted a audio/video visit. I spent  62m 40s on the video call with the patient. I spent an additional 30 minutes on pre- and post-visit activities on the date of service .     The patient was not located and I was located within 250 yards of a hospital-based location during the real-time audio and video.

## 2022-06-12 NOTE — Unmapped (Signed)
MEN'S HEALTH FOLLOW UP VISIT:     HISTORY OF PRESENT ILLNESS  Logan Quinn is a 42 y.o. male previously seen in follow up for ED and hypogonadism     ED   Patient has had a slow decline in erectile function   Spontaneous   When attempting intercourse   He has tried sildenafil 100mg  po prn , did not help   Had Not tried tadalafil   Tadalafil 5mg  po every day initiated 02/2022  - satisfied with results          Hypogonadism   Slow decline in erectile function and libido   Noted prior low values of 105 and 122    Held on TRT due to fertility concerns, now done with children   Interested in TRT.     T cyp 100mg  IM weekly initiated 02/2022    Reviewed Hematocrit 46, estradiol 148, T 911 05/2022 - improvement in erections, libido improved, energy unchanged     Inject on Thursdays, blood drawn on Monday      Patient has OSA and is on CPAP          Labs and radiology results reviewed today with the patient include:  Lab Results   Component Value Date    Testosterone 911 (H) 05/13/2022    Testosterone 105 (L) 01/09/2021    Testosterone 122 (L) 03/24/2019    FSH 3.7 04/22/2018    LH 4.7 04/22/2018    Estradiol 148.4 05/13/2022    Prolactin 20.4 (H) 04/22/2018    Hemoglobin A1C 6.4 (A) 05/21/2022    Hemoglobin A1C 7.9 (H) 01/15/2022    Hemoglobin A1C 7.3 (A) 10/10/2021    Hemoglobin A1C 7.3 (A) 07/13/2021    Hemoglobin A1C 6.0 09/15/2014    Hemoglobin A1C 6.6 (H) 05/18/2014    Hemoglobin A1C 8.8 (H) 02/16/2014      Lab Results   Component Value Date/Time    PSA 0.35 04/22/2018 02:49 PM             Active Medications:    @ACTMED @    Allergies:    Allergies as of 06/18/2022 - Reviewed 06/18/2022   Allergen Reaction Noted    Erythromycin Nausea And Vomiting 01/25/2015    Penicillins Rash, Swelling, and Hives 12/06/2012    Sulfa (sulfonamide antibiotics) Nausea And Vomiting and Nausea Only 12/30/2012    Other           OBJECTIVE:  Physical Exam:  BP 150/78  - Pulse 81  - Temp 37.1 ??C (98.7 ??F) (Temporal)  - Wt (!) 215.2 kg (474 lb 6.4 oz)  - BMI 66.20 kg/m??           ASSESSMENT/PLAN:   Logan Quinn is a 42 y.o. male here for Ed and hypogonadism    Hypogonadism   T cyp 100mg  IM weekly rx provided    Reviewed Hematocrit 46, estradiol 148, T 911 05/2022   reduce to 80mg  IM weekly (0.26ml)   Anastrazole 1mg  po every other day rx provided  T, hematocrit, estradiol - ordered for 3 months     ED   Tadalafil 5mg  po every day rx provided

## 2022-06-14 NOTE — Unmapped (Signed)
The [facility] has made a second and final attempt to reach this patient to refill the following medication:*HUMIRA(CF) PEN 80 mg/0.8 mL Pnkt (adalimumab)**.      We have left voicemails on the following phone numbers: [phone] and have sent a Mychart questionnaire..    Dates contacted: *06/04/22 and  06/13/22 **  Last scheduled delivery: **05/17/22 *    The patient may be at risk of non-compliance with this medication. The patient should call the [facility] at [phone]  Option 4, then Option 2 (all other specialty patients) to refill medication.    Logan Quinn   [facility] Specialty Technician

## 2022-06-14 NOTE — Unmapped (Signed)
[facility] Refill Coordination Note    Specialty Medication(s) to be Shipped:   Inflammatory Disorders: Humira    Other medication(s) to be shipped: No additional medications requested for fill at this time     Logan Quinn, DOB: [birth year]  Phone: [phone] (home)       All above HIPAA information was verified with patient.     Was a Nurse, learning disability used for this call? No    Completed refill call assessment today to schedule patient's medication shipment from the [facility] [phone]).  All relevant notes have been reviewed.     Specialty medication(s) and dose(s) confirmed: Regimen is correct and unchanged.   Changes to medications: Donnovan reports no changes at this time.  Changes to insurance: No  New side effects reported not previously addressed with a pharmacist or physician: None reported  Questions for the pharmacist: No    Confirmed patient received a Conservation officer, historic buildings and a Surveyor, mining with first shipment. The patient will receive a drug information handout for each medication shipped and additional FDA Medication Guides as required.       DISEASE/MEDICATION-SPECIFIC INFORMATION        For patients on injectable medications: Patient currently has 0 doses left.  Next injection is scheduled for 06/23/22.    SPECIALTY MEDICATION ADHERENCE     Medication Adherence    Patient reported X missed doses in the last month: 0  Specialty Medication: HUMIRA(CF) PEN 80 mg/0.8 mL  Patient is on additional specialty medications: No                                Were doses missed due to medication being on hold? No      REFERRAL TO PHARMACIST     Referral to the pharmacist: Not needed      [facility]     Shipping address confirmed in Epic.     Delivery Scheduled: Yes, Expected medication delivery date: 06/19/22.     Medication will be delivered via Same Day Courier to the prescription address in Epic WAM.    Quintella Reichert   [facility] Specialty Technician

## 2022-06-14 NOTE — Unmapped (Signed)
T is slightly high and the estradiol is very high.    We will discuss further dosing adjustment on 1/9

## 2022-06-16 NOTE — Unmapped (Signed)
Continue with nutrition recommendations      Try to keep intake consistent and avoid skipping any meals.  Spread meal out into about 5-6 small meals.  Eat only to your natural satiety level. You should feel satisfied, not ???uncomfortable.???   Focus on healthy food options like lean protein and non-starchy vegetables.   Cooking vegetables and fruit may be better tolerated than raw.  Avoid fried or greasy foods that are unhealthy and may be difficult to tolerate.    Limit intake of caffeine, spicy and acidic foods   Avoid intake of artificial/sugary beverages. Hydrate with water daily (at least 64 ounces per day)  Try to stay active as able throughout your day. Aim for a minimum of 150 minutes of moderate/intense physical activity per week.

## 2022-06-18 ENCOUNTER — Ambulatory Visit: Admit: 2022-06-18 | Discharge: 2022-06-19 | Payer: MEDICAID

## 2022-06-18 DIAGNOSIS — E1069 Type 1 diabetes mellitus with other specified complication: Principal | ICD-10-CM

## 2022-06-18 DIAGNOSIS — E280 Estrogen excess: Principal | ICD-10-CM

## 2022-06-18 DIAGNOSIS — N529 Male erectile dysfunction, unspecified: Principal | ICD-10-CM

## 2022-06-18 DIAGNOSIS — E291 Testicular hypofunction: Principal | ICD-10-CM

## 2022-06-18 MED ORDER — ANASTROZOLE 1 MG TABLET
ORAL_TABLET | ORAL | 3 refills | 90 days | Status: CP
Start: 2022-06-18 — End: ?

## 2022-06-18 MED ORDER — TESTOSTERONE CYPIONATE 200 MG/ML INTRAMUSCULAR OIL
INTRAMUSCULAR | 5 refills | 28 days | Status: CP
Start: 2022-06-18 — End: ?

## 2022-06-19 MED FILL — HUMIRA(CF) PEN 80 MG/0.8 ML SUBCUTANEOUS KIT: SUBCUTANEOUS | 28 days supply | Qty: 4 | Fill #11

## 2022-06-21 ENCOUNTER — Ambulatory Visit (INDEPENDENT_AMBULATORY_CARE_PROVIDER_SITE_OTHER): Payer: 59

## 2022-06-21 ENCOUNTER — Ambulatory Visit
Admission: EM | Admit: 2022-06-21 | Discharge: 2022-06-21 | Disposition: A | Discharge status: Home / Self Care | Payer: 59

## 2022-06-21 DIAGNOSIS — M25579 Pain in unspecified ankle and joints of unspecified foot: Secondary | ICD-10-CM | POA: Diagnosis present

## 2022-06-21 DIAGNOSIS — M25572 Pain in left ankle and joints of left foot: Secondary | ICD-10-CM

## 2022-06-21 HISTORY — DX: Gout, unspecified: M10.9

## 2022-06-21 HISTORY — DX: Sleep apnea, unspecified: G47.30

## 2022-06-21 HISTORY — DX: Disorder of thyroid, unspecified: E07.9

## 2022-06-21 NOTE — ED Provider Notes (Signed)
Jason Davidson    CSN: [id number] Arrival date & time: 06/21/22  1353      History   Chief Complaint Chief Complaint  Patient presents with   Ankle Pain    Entered by patient    HPI Jason Davidson is a 42 y.o. male.  Patient presents with left ankle pain after his slipped and twisted his ankle on 06/18/2022.  No open wounds, redness, bruising, numbness, weakness, or other symptoms.  The pain is worse with weight bearing.  His medical history includes chronic venous insufficiency, lymphedema, diabetes, morbid obesity.   The history is provided by the patient and medical records.    Past Medical History:  Diagnosis Date   Asthma    Depressed    Diabetes mellitus without complication ([place])    Gout    IBS (irritable bowel syndrome)    Morbid obesity ([place])    Sleep apnea    Thyroid disease     Patient Active Problem List   Diagnosis Date Noted   Tinea pedis 03/18/2022   Acquired hypothyroidism 01/09/2021   Acute non-recurrent pansinusitis 07/19/2020   Hidradenitis suppurativa 04/29/2018   Allergic rhinitis 12/23/2017   Varicose veins of both lower extremities with pain 10/16/2017   IBS (irritable bowel syndrome) 06/18/2017   Chronic venous insufficiency 12/31/2016   Lymphedema 12/31/2016   Pain in joint, ankle and foot 12/31/2016   Diabetes ([place]) 12/31/2016   GERD (gastroesophageal reflux disease) 12/31/2016   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   H/O retinal detachment 10/01/2016   No diabetic retinopathy in both eyes 10/01/2016   Cellulitis of right lower extremity 06/19/2016   Chronic joint pain 11/16/2015   Skin infection 05/15/2015   Depressed    Tick bite 10/20/2014   Primary insomnia 07/08/2014   Candidal balanitis 02/16/2014   Skin tag 01/17/2014   Left shoulder pain 10/04/2013   Anxiety 09/13/2013   Diarrhea 09/13/2013   Bleeding external hemorrhoids 09/03/2013   NAFLD (nonalcoholic fatty liver disease)  08/12/2013   Headache 07/05/2013   Abdominal pain 03/05/2013   Morbid (severe) obesity due to excess calories ([place]) 03/05/2013   Suicidal ideation 02/09/2013   Obstructive sleep apnea (adult) (pediatric) 07/21/2012   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 Authorizing Provider  anastrozole (ARIMIDEX) 1 MG tablet Take by mouth. 06/18/22  Yes [provider]  levothyroxine (SYNTHROID) 50 MCG tablet Take 1 tablet by mouth daily. 02/17/22 02/17/23 Yes [provider]  ramelteon (ROZEREM) 8 MG tablet Take by mouth. 05/25/21  Yes [provider]  WELLBUTRIN XL 150 MG 24 hr tablet Take 1 tablet by mouth every morning. 04/18/22  Yes [provider]  Adalimumab 40 MG/0.4ML PNKT Inject into the skin. 09/10/18   [provider]  AIMOVIG 70 MG/ML SOAJ  07/21/17   [provider]  albuterol (VENTOLIN HFA) 108 (90 Base) MCG/ACT inhaler Inhale 1-2 puffs into the lungs every 6 (six) hours as needed for wheezing or shortness of breath. 04/06/20   Margarette Canada, NP  ALPRAZolam Duanne Moron) 0.5 MG tablet Take 0.5 mg by mouth at bedtime as needed for anxiety.    [provider]  azelastine (ASTELIN) 0.1 % nasal spray Place into the nose.    [provider]  Colchicine 0.6 MG CAPS Take 1.2 mg on Day 1 of flare, followed by 0.6 mg one hour later. Day 2 take 0.6 mg BID until flare resolves. 08/01/17   [provider]  cyclobenzaprine (FLEXERIL) 10 MG tablet Take 1 tablet (10 mg total) by mouth 3 (three) times daily as needed. 01/31/18   Joni Reining, PA-C  dextroamphetamine (DEXTROSTAT) 10 MG tablet Take 20 mg by mouth 2 (two) times daily.    [provider]  dicyclomine  (BENTYL) 10 MG capsule Take 10 mg by mouth 3 (three) times daily before meals.    [provider]  diphenoxylate-atropine (LOMOTIL) 2.5-0.025 MG tablet Take by mouth. 01/05/18   [provider]  DULoxetine (CYMBALTA) 60 MG capsule Take 120 mg by mouth daily.     [provider]  famotidine (PEPCID) 20 MG tablet Take by mouth. 10/15/18 10/15/19  [provider]  fluticasone (FLONASE) 50 MCG/ACT nasal spray Place 1 spray into both nostrils 2 (two) times daily. 01/03/21   [provider]  glipiZIDE (GLUCOTROL) 10 MG tablet Take 10 mg by mouth daily before breakfast.    [provider]  glucose blood test strip Use 1 strip 3 times a day with meter 06/26/15   [provider]  HUMALOG 100 UNIT/ML injection SMARTSIG:120 Unit(s) SUB-Q Daily 03/01/20   [provider]  hydrochlorothiazide (HYDRODIURIL) 12.5 MG tablet Take 12.5 mg by mouth daily. 02/05/21   [provider]  hydrocortisone (ANUSOL-HC) 2.5 % rectal cream Place rectally. 12/16/16   [provider]  hydrOXYzine (ATARAX/VISTARIL) 25 MG tablet Take 25 mg by mouth 4 (four) times daily as needed. 11/17/20   [provider]  Insulin Syringe-Needle U-100 31G X 5/16" 1 ML MISC Use as directed with the Lantus Insulin nightly 08/26/17   [provider]  ketoconazole (NIZORAL) 2 % cream Apply topically daily. 01/10/21   [provider]  levothyroxine (SYNTHROID) 50 MCG tablet Take by mouth. 10/05/18 10/05/19  [provider]  lidocaine (XYLOCAINE) 2 % solution Use as directed 15 mLs in the mouth or throat as needed for mouth pain. 06/14/21   Mickie Bail, NP  loperamide (IMODIUM) 2 MG capsule Take by mouth.    [provider]  montelukast (SINGULAIR) 10 MG tablet Take by mouth. 03/31/17 03/03/20  [provider]  montelukast (SINGULAIR) 10 MG tablet Take 1 tablet by mouth daily.    [provider]  ondansetron (ZOFRAN-ODT)  4 MG disintegrating tablet Take 4 mg by mouth every 8 (eight) hours as needed. 01/09/21   [provider]  oxybutynin (DITROPAN) 5 MG tablet Take 5 mg by mouth 2 (two) times daily. 01/09/21   [provider]  oxybutynin (DITROPAN) 5 MG tablet Take 1 tablet by mouth 2 (two) times daily. 06/06/21   [provider]  pantoprazole (PROTONIX) 40 MG tablet Take 40 mg by mouth daily.    [provider]  polyethylene glycol powder (GLYCOLAX/MIRALAX) powder take 17GM (DISSOLVED IN WATER) by mouth once daily 07/25/17   [provider]  predniSONE (DELTASONE) 50 MG tablet One qd 05/28/21   Rodriguez-[place], Nettie Elm, PA-C  PROAIR HFA 108 (90 Base) MCG/ACT inhaler 1-2 puffs every 4 (four) hours as needed. Last used: [id number] 07/21/17   [provider]  promethazine (PHENERGAN) 25 MG tablet Take 25-50 mg by mouth at bedtime as needed. 01/09/21  [provider]  propranolol (INNOPRAN XL) 120 MG 24 hr capsule Take 120 mg by mouth at bedtime.    [provider]  ramelteon (ROZEREM) 8 MG tablet Take 8 mg by mouth at bedtime. 06/22/21   [provider]  sodium fluoride (FLUORISHIELD) 1.1 % GEL dental gel  01/29/21   [provider]  tadalafil (CIALIS) 5 MG tablet Take by mouth. 02/14/22   [provider]  testosterone cypionate (DEPOTESTOSTERONE CYPIONATE) 200 MG/ML injection Inject into the muscle. 02/14/22   [provider]  topiramate (TOPAMAX) 50 MG tablet Take 50 mg by mouth 2 (two) times daily.    [provider]    Family History Family History  Problem Relation Age of Onset   Diabetes Mother    Depression Mother    Diabetes Sister    Diabetes Brother    Diabetes Maternal Uncle    Diabetes Maternal Grandmother    Heart disease Maternal Grandmother    Cancer Maternal Grandfather    COPD Paternal Grandmother    Cancer Paternal Grandfather    Varicose Veins Paternal Grandfather    Healthy Father      Social History Social History   Tobacco Use   Smoking status: Never   Smokeless tobacco: Never  Vaping Use   Vaping Use: Never used  Substance Use Topics   Alcohol use: No   Drug use: No     Allergies   Erythromycin, Penicillins, and Sulfa antibiotics   Review of Systems Review of Systems  Constitutional:  Negative for chills and fever.  Musculoskeletal:  Positive for arthralgias, gait problem and joint swelling.  Skin:  Negative for color change, rash and wound.  Neurological:  Negative for weakness and numbness.  All other systems reviewed and are negative.    Physical Exam Triage Vital Signs ED Triage Vitals [06/21/22 1507]  Enc Vitals Group     BP (!) 142/88     Pulse Rate 96     Resp 18     Temp 98.6 F (37 C)     Temp src      SpO2 97 %     Weight      Height      Head Circumference      Peak Flow      Pain Score      Pain Loc      Pain Edu?      Excl. in [place]?    No data found.  Updated Vital Signs BP (!) 142/88   Pulse 96   Temp 98.6 F (37 C)   Resp 18   SpO2 97%   Visual Acuity Right Eye Distance:   Left Eye Distance:   Bilateral Distance:    Right Eye Near:   Left Eye Near:    Bilateral Near:     Physical Exam Vitals and nursing note reviewed.  Constitutional:      General: He is not in acute distress.    Appearance: He is well-developed. He is obese. He is not ill-appearing.  HENT:     Mouth/Throat:     Mouth: Mucous membranes are moist.  Cardiovascular:     Rate and Rhythm: Normal rate and regular rhythm.  Pulmonary:     Effort: Pulmonary effort is normal. No respiratory distress.  Musculoskeletal:        General: Swelling and tenderness present. No deformity. Normal range of motion.     Cervical back: Neck supple.  Skin:  General: Skin is warm and dry.     Capillary Refill: Capillary refill takes less than 2 seconds.     Findings: No bruising, erythema, lesion or rash.  Neurological:     General: No focal  deficit present.     Mental Status: He is alert and oriented to person, place, and time.     Sensory: No sensory deficit.     Motor: No weakness.     Gait: Gait abnormal.     Comments: In wheelchair due to discomfort with weightbearing.    Psychiatric:        Mood and Affect: Mood normal.        Behavior: Behavior normal.      UC Treatments / Results  Labs (all labs ordered are listed, but only abnormal results are displayed) Labs Reviewed - No data to display  EKG   Radiology DG Ankle Complete Left  Result Date: 06/21/2022 CLINICAL DATA:  Fall 3 days ago with left ankle pain. EXAM: LEFT ANKLE COMPLETE - 3+ VIEW COMPARISON:  None Available. FINDINGS: There is no evidence of fracture, dislocation, or joint effusion. There is no evidence of arthropathy or other focal bone abnormality. Soft tissues are unremarkable. IMPRESSION: Negative. Electronically Signed   By: Aletta Edouard M.D.   On: 06/21/2022 15:43    Procedures Procedures (including critical care time)  Medications Ordered in UC Medications - No data to display  Initial Impression / Assessment and Plan / UC Course  I have reviewed the triage vital signs and the nursing notes.  Pertinent labs & imaging results that were available during my care of the patient were reviewed by me and considered in my medical decision making (see chart for details).    Left ankle pain.  Treating with ace wrap, rest, elevation, ice packs, Tylenol or ibuprofen.  Education provided on ankle pain.  Instructed patient to follow-up with orthopedics.  Contact information for on-call Ortho provided.  Patient agrees to plan of care.   Final Clinical Impressions(s) / UC Diagnoses   Final diagnoses:  Acute left ankle pain     Discharge Instructions      Take Tylenol or ibuprofen as directed.  Rest and elevate your ankle.  Apply ice packs 2-3 times a day for up to 20 minutes each.  Wear the ace wrap as needed for comfort.    Follow up  with an orthopedist.        ED Prescriptions   None    PDMP not reviewed this encounter.   Sharion Balloon, NP 06/21/22 [id number]

## 2022-06-21 NOTE — Discharge Instructions (Addendum)
Take Tylenol or ibuprofen as directed.  Rest and elevate your ankle.  Apply ice packs 2-3 times a day for up to 20 minutes each.  Wear the ace wrap as needed for comfort.    Follow up with an orthopedist.
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
## 2022-06-22 MED ORDER — LIDOCAINE 5 % TOPICAL OINTMENT
0 refills | 0 days
Start: 2022-06-22 — End: ?

## 2022-06-25 MED ORDER — LIDOCAINE 5 % TOPICAL OINTMENT
Freq: Three times a day (TID) | TOPICAL | 1 refills | 0 days | Status: CP | PRN
Start: 2022-06-25 — End: 2023-06-25

## 2022-06-27 DIAGNOSIS — Z794 Long term (current) use of insulin: Principal | ICD-10-CM

## 2022-06-27 DIAGNOSIS — E1165 Type 2 diabetes mellitus with hyperglycemia: Principal | ICD-10-CM

## 2022-06-27 MED ORDER — LANTUS U-100 INSULIN 100 UNIT/ML SUBCUTANEOUS SOLUTION
Freq: Every evening | SUBCUTANEOUS | 1 refills | 200 days | Status: CP
Start: 2022-06-27 — End: 2023-06-27

## 2022-06-27 NOTE — Unmapped (Signed)
Patient is requesting the following refill  Requested Prescriptions     Pending Prescriptions Disp Refills    insulin glargine (LANTUS U-100 INSULIN) 100 unit/mL injection 10 mL 1     Sig: Inject 0.05 mL (5 Units total) under the skin nightly.       Recent Visits  Date Type Provider Dept   05/21/22 Office Visit Loran Senters, FNP [facility] [address] [facility]   02/27/22 Office Visit Nile Dear, Emogene Morgan, NP [facility] [address] [facility]   02/19/22 Office Visit Loran Senters, FNP [facility] [address] At [facility]   01/02/22 Office Visit Loran Senters, FNP [facility] [address] [facility]   11/28/21 Office Visit Loran Senters, FNP [facility] [address] [facility]   10/10/21 Office Visit Loran Senters, FNP [facility] [facility] [address] At [facility]   09/11/21 Office Visit Loran Senters, FNP [place] [address] At [facility]   07/13/21 Office Visit Loran Senters, FNP [facility]   Showing recent visits within past 365 days with a meds authorizing provider and meeting all other requirements  Future Appointments  Date Type Provider Dept   08/21/22 Appointment Loran Senters, FNP [facility] [address] [facility]   Showing future appointments within next 365 days with a meds authorizing provider and meeting all other requirements       Labs: A1c:   Hemoglobin A1C (%)   Date Value   05/21/2022 6.4 (A)   09/15/2014 6.0    Creatinine:   Creatinine (mg/dL)   Date Value   16/03/9603 0.92   01/17/2014 0.74

## 2022-07-02 ENCOUNTER — Ambulatory Visit: Admit: 2022-07-02 | Discharge: 2022-07-03 | Payer: MEDICAID | Attending: Family | Primary: Family

## 2022-07-02 DIAGNOSIS — M109 Gout, unspecified: Principal | ICD-10-CM

## 2022-07-02 LAB — URIC ACID: URIC ACID: 9.1 mg/dL

## 2022-07-02 MED ORDER — ALBUTEROL SULFATE HFA 90 MCG/ACTUATION AEROSOL INHALER
Freq: Four times a day (QID) | RESPIRATORY_TRACT | 5 refills | 0 days | Status: CP | PRN
Start: 2022-07-02 — End: 2023-07-02

## 2022-07-02 NOTE — Unmapped (Addendum)
-  Recommend starting preventative treatment for gout if you have 2 or more flares per year.   -Allopurinol is generally the first choice, 100 mg daily and increase every 2-4 weeks until uric acid levels are less than 6. Once uric acid is at 6 or less, you will remain on that dosage daily.   -Check uric acid levels every 4 weeks once allopurinol is started.   -Take colchicine 0.6 mg daily for 3 months while allopurinol is being initiated.       NONPHARMACOLOGIC TREATMENT  The [employer] (ACR) recommends diet modification for all gout patients. Uric acid is a byproduct of the metabolism of purines.1 Meat and seafood should be consumed in moderation.3,13 Foods high in purine content, such as organ meats (e.g., kidneys, liver) should be avoided. Fructose is the only carbohydrate associated with elevated serum uric acid levels, and is believed to increase the synthesis or metabolism of purines. Studies have shown that consumption of fructose (e.g., high fructose corn syrup food and beverages) is associated with elevated serum urate levels and should be avoided.3,14 Alcohol of all types should also be avoided because it elevates serum urate levels by increasing the production and reducing the elimination of uric acid.15

## 2022-07-02 NOTE — Unmapped (Signed)
Assessment and Plan:      Diagnosis ICD-10-CM Associated Orders   1. Acute gout involving toe of left foot, unspecified cause  M10.9 Uric acid        Gout:  will treat acute flare with naproxen 500 mg BID. Check uric acid level today. If > 7.0 plan to initiate allopurinol two weeks after acute flare has resolved. Plan for colchicine once daily x 3 months with allopurinol daily to reduce risk of acute flare while initiating preventative therapy.      -Recommend starting preventative treatment for gout if you have 2 or more flares per year.   -Allopurinol is generally the first choice, 100 mg daily and increase every 2-4 weeks until uric acid levels are less than 6. Once uric acid is at 6 or less, you will remain on that dosage daily.   -Check uric acid levels every 4 weeks once allopurinol is started.   -Take colchicine 0.6 mg daily for 3 months while allopurinol is being initiated.       I personally spent 30 minutes face-to-face and non-face-to-face in the care of this patient, which includes all pre, intra, and post visit time on the date of service.    Return in about 4 weeks (around 07/30/2022) for Recheck.    HPI:      Logan Quinn is here for   Chief Complaint   Patient presents with    Gout     Gout flare up x 2-3 days LT great toe.  Wants preventative med since flare ups are more often.     Gout: Patient presents with gout symptoms that started  2-3 days ago. The pain is located primarily left great toe. Pain on a scale of 1 to 10 is 7.  For relief, patient has tried the following medications ibuprofen then naproxen. Also drinking tart cherry juice.     Patient reports a couple of weeks ago he slipped and fell on a ramp at his house from inclement weather.   [facility] put him in a boot for sprained ankle. Xray showed no fracture.     Left GT painful and began swelling.   Feels like toothache especially with weight bearing.   Gout flares, 1-2 per year in the past.   In the past year, more frequently.     Tart cherry juice--no longer effective.   Generally starts with mitigare, no longer effective.   Has 800 mg ibuprofen, has also been given naproxen 500 mg BID for past few days.   Ibuprofen for one week with tylenol, naproxen for approx one week most recently (for ankle).          ROS:      Comprehensive 10 point ROS negative unless otherwise stated in the HPI.      [facility] Components:     Medication adherence and barriers to the treatment plan have been addressed. Opportunities to optimize healthy behaviors have been discussed. Patient / caregiver voiced understanding.    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)     Obstructive sleep apnea     OSA on CPAP     Retinal detachment 04/2014    Severe obstructive sleep apnea     Trapezius muscle strain 12/07/2013    Urinary incontinence, nocturnal enuresis     Venous insufficiency      Past Surgical History:   Procedure Laterality Date    EYE SURGERY  04/2014    LASER ABLATION INCOMPETENT VEIN [facility] HISTORICAL RESULT) Right     PR COLONOSCOPY FLX DX W/COLLJ SPEC WHEN PFRMD N/A 03/24/2013    Procedure: COLONOSCOPY, FLEXIBLE, PROXIMAL TO SPLENIC FLEXURE; DIAGNOSTIC, W/WO COLLECTION SPECIMEN BY BRUSH OR WASH;  Surgeon: Clint Bolder, MD;  Location: [facility];  Service: Gastroenterology    PR EYE SURG POST SGMT PROC UNLISTED Left     pneumatic retinopexy OS    PR UPPER GI ENDOSCOPY,DIAGNOSIS N/A 02/02/2013    Procedure: UGI ENDO, INCLUDE ESOPHAGUS, STOMACH, & DUODENUM &/OR JEJUNUM; DX W/WO COLLECTION SPECIMN, BY BRUSH OR WASH;  Surgeon: Malcolm Metro, MD;  Location: [facility];  Service: Gastroenterology    [place] PYLORIC STENOSIS ([place] HISTORICAL RESULT)         Family History:     Family History   Problem Relation Age of Onset    Cancer Maternal Grandfather         Stomach Cancer    Hearing loss Maternal Grandfather     Cancer Paternal Grandfather         Bone Cancer, Lung Cancer    COPD Paternal Grandmother     Arthritis Paternal Grandmother     Depression Paternal Grandmother     Diabetes Mother     Heart disease Mother     Migraines Mother     Arthritis Mother     Depression Mother     GER disease Mother     Hypertension Mother     Angina Mother     COPD Mother     Glaucoma Mother     Hearing loss Mother     Cataracts Mother     Diabetes Sister     Migraines Sister     Asthma Sister     Depression Sister     Hearing loss Sister     Diabetes Brother     Asthma Brother     Diabetes Maternal Grandmother     Heart disease Maternal Grandmother     Migraines Maternal Grandmother     Depression Maternal Grandmother     Angina Maternal Grandmother     Hypertension Maternal Grandmother     Stroke Maternal Grandmother     Diabetes Maternal Uncle     Hearing loss Maternal Uncle     Diabetes Maternal Uncle         resulted in need for kidney transplant    Liver disease Maternal Uncle     Kidney disease Maternal Uncle         needed kidney transplant    Asthma Brother     No Known Problems Father     No Known Problems Paternal Aunt     No Known Problems Paternal Uncle     No Known Problems Other  Colorectal Cancer Neg Hx     Esophageal cancer Neg Hx     Liver cancer Neg Hx     Pancreatic cancer Neg Hx     Stomach cancer Neg Hx     Amblyopia Neg Hx     Blindness Neg Hx     Retinal detachment Neg Hx     Strabismus Neg Hx     Macular degeneration Neg Hx     Anesthesia problems Neg Hx     Broken bones Neg Hx     Clotting disorder Neg Hx Collagen disease Neg Hx     Dislocations Neg Hx     Fibromyalgia Neg Hx     Gout Neg Hx     Hemophilia Neg Hx     Osteoporosis Neg Hx     Rheumatologic disease Neg Hx     Scoliosis Neg Hx     Severe sprains Neg Hx     Sickle cell anemia Neg Hx     Spinal Compression Fracture Neg Hx     Melanoma Neg Hx     Basal cell carcinoma Neg Hx     Squamous cell carcinoma Neg Hx     Deep vein thrombosis Neg Hx     Thyroid disease Neg Hx        Social History:     Social History     Tobacco Use    Smoking status: Never     Passive exposure: Never    Smokeless tobacco: Never   Vaping Use    Vaping Use: Never used   Substance Use Topics    Alcohol use: Never     Comment: rare social    Drug use: Never       Allergies:     Erythromycin, Penicillins, Sulfa (sulfonamide antibiotics), and Other    Current Medications:     Current Outpatient Medications   Medication Sig Dispense Refill    adalimumab (HUMIRA,CF, PEN) 80 mg/0.8 mL PnKt Inject the contents of 1 pen (80mg ) under the skin once a week. 4 each 11    albuterol 2.5 mg /3 mL (0.083 %) nebulizer solution Inhale 3 mL (2.5 mg total) by nebulization every four (4) hours as needed for wheezing. 180 mL 2    ALPRAZolam (XANAX) 0.5 MG tablet Takes 1 tablet nightly and 1 tablet a day when needed      anastrozole (ARIMIDEX) 1 mg tablet Take 1 tablet (1 mg total) by mouth every other day. 45 tablet 3    ascorbic acid (VITAMIN C ORAL) Take 1 capsule by mouth nightly.      BD LUER-LOK SYRINGE 3 mL 21 gauge x 1 1/2 Syrg weekly      blood sugar diagnostic (ACCU-CHEK GUIDE TEST STRIPS) Strp by Other route Three (3) times a day before meals. 300 each 1    blood-glucose meter kit Use as directed 1 each 0    budesonide-formoteroL (SYMBICORT) 80-4.5 mcg/actuation inhaler Inhale 2 puffs Two (2) times a day. 10.2 g 11    chlorhexidine (PERIDEX) 0.12 % solution 15 mL by Oromucosal route two (2) times a day. 900 mL 3    clindamycin (CLEOCIN T) 1 % external solution clindamycin phosphate 1 % topical solution   APPLY TOPICALLY TO GROIN AREA TWICE A DAY      clindamycin (CLEOCIN T) 1 % lotion APPLY TOPICALLY AT NIGHT TO INFLAMED AREAS AS NEEDED FOR FLARES      clobetasoL (TEMOVATE) 0.05 % Gel  clobetasoL (TEMOVATE) 0.05 % ointment Apply daily to painful affected areas if needed for flares, then stop 15 g 1    colchicine (MITIGARE) 0.6 mg cap capsule TAKE 2 CAPSULES BY MOUTH ON DAY ONE, FOLLOWED BY ONE CAPSULE ONE HOUR LATER, ON DAY 2 TAKE ONE CAPSULE BY MOUTH TWICEA DAY UNTIL RESOLVED 60 capsule 0    cyclobenzaprine (FLEXERIL) 10 MG tablet Take 1 tablet (10 mg total) by mouth Three (3) times a day as needed for muscle spasms. 60 tablet 1    dextroamphetamine sulfate (DEXTROSTAT) 10 MG tablet Take 2 tablets (20 mg total) by mouth two (2) times a day.      dicyclomine (BENTYL) 10 mg capsule Take 1 capsule (10 mg total) by mouth Four (4) times a day (before meals and nightly). 120 capsule 3    diphenoxylate-atropine (LOMOTIL) 2.5-0.025 mg per tablet Take 1 tablet by mouth two (2) times a day as needed for diarrhea. 30 tablet 5    dulaglutide 4.5 mg/0.5 mL PnIj Inject 4.5 mg under the skin every seven (7) days. 2 mL 2    DULoxetine (CYMBALTA) 60 MG capsule Take 1 capsule (60 mg total) by mouth Two (2) times a day. 180 capsule 0    empty container Misc Use as directed to dispose of needles. When full, make sure lid is closed tightly then dispose of container in trash. 1 each 2    erenumab-aooe (AIMOVIG AUTOINJECTOR) 140 mg/mL AtIn Inject 1 Pen under the skin every thirty (30) days. 3 mL 1    famotidine (PEPCID) 20 MG tablet Take 1 tablet (20 mg total) by mouth two (2) times a day. 180 tablet 1    fluoride, sodium, 1.1 % Pste       fluticasone propionate (FLONASE) 50 mcg/actuation nasal spray 1 spray into each nostril Two (2) times a day. 16 g 5    glipiZIDE (GLUCOTROL) 10 MG tablet Take 2 tablets (20 mg total) by mouth Two (2) times a day (30 minutes before a meal). 360 tablet 1    HUMALOG U-100 INSULIN 100 unit/mL injection INJECT 120 UNITS UNDER SKIN DAILY VIA OMNIPOD 40 mL 5    hydroCHLOROthiazide (HYDRODIURIL) 25 MG tablet Take 1 tablet (25 mg total) by mouth daily. (Patient taking differently: Take 0.5 tablets (12.5 mg total) by mouth daily.) 90 tablet 1    hydrOXYzine (ATARAX) 25 MG tablet Take 1 tablet (25 mg total) by mouth Three (3) times a day as needed. May take two before bed for sleep. 120 tablet 1    ibuprofen (ADVIL,MOTRIN) 800 MG tablet Take 1 tablet (800 mg total) by mouth every eight (8) hours as needed. 90 tablet 1    insulin glargine (LANTUS U-100 INSULIN) 100 unit/mL injection Inject 0.05 mL (5 Units total) under the skin nightly. 10 mL 1    insulin pump cart,automated,BT (OMNIPOD 5 G6 PODS, GEN 5,) Crtg Change POD every 48 hours. 3 each 11    insulin syringe-needle U-100 1 mL 31 gauge x 5/16 (8 mm) Syrg Once daily for insulin dosing. 100 each 3    ketoconazole (NIZORAL) 2 % cream       lancets (ACCU-CHEK FASTCLIX LANCET DRUM) Misc Use to check blood sugars 3 times a day before meals or as directed 200 each 5    levothyroxine (SYNTHROID) 50 MCG tablet Take 1 tablet (50 mcg total) by mouth daily. 90 tablet 1    lidocaine (XYLOCAINE) 5 % ointment Apply topically Three (3) times a day  as needed. 30 g 1    montelukast (SINGULAIR) 10 mg tablet Take 1 tablet (10 mg total) by mouth daily. 90 tablet 3    multivitamin (MULTIPLE VITAMIN ORAL) Take 1 tablet by mouth daily.      nystatin (MYCOSTATIN) 100,000 unit/gram powder       ondansetron (ZOFRAN) 4 MG tablet Take 1 tablet (4 mg total) by mouth every eight (8) hours as needed for nausea. 30 tablet 1    oxybutynin (DITROPAN) 5 MG tablet Take 1 tablet (5 mg total) by mouth Two (2) times a day. 180 tablet 1    pantoprazole (PROTONIX) 40 MG tablet Take 1 tablet (40 mg total) by mouth two (2) times a day. 180 tablet 1    polyethylene glycol (GLYCOLAX) 17 gram/dose powder       promethazine (PHENERGAN) 25 MG tablet Take 1 tablet (25 mg total) by mouth every eight (8) hours as needed for nausea. 30 tablet 1    propranoloL (INDERAL LA) 120 mg 24 hr capsule Take 1 capsule (120 mg total) by mouth daily. 90 capsule 1    ramelteon (ROZEREM) 8 mg tablet Take 1 tablet (8 mg total) by mouth nightly.      safety needles (BD SAFETYGLIDE NEEDLE) 23 gauge x 1 Ndle 1 Units by Miscellaneous route once a week. 12 each 11    safety needles 18 gauge x 1 1/2 Ndle 1 Units by Miscellaneous route once a week. 12 each 11    sour cherry extract (TART CHERRY EXTRACT) 1,000 mg cap       tadalafiL (CIALIS) 5 MG tablet Take 1 tablet (5 mg total) by mouth in the morning. 90 tablet 3    testosterone cypionate (DEPOTESTOTERONE CYPIONATE) 200 mg/mL injection Inject 0.5 mL (100 mg total) into the muscle once a week. Inject 0.5cc (100mg ) weekly 2 mL 5    topiramate (TOPAMAX) 50 MG tablet Take 1.5 tablets (75 mg total) by mouth two (2) times a day. 270 tablet 1    WELLBUTRIN XL 150 mg 24 hr tablet Take 1 tablet (150 mg total) by mouth every morning.      albuterol HFA 90 mcg/actuation inhaler Inhale 2 puffs every six (6) hours as needed for wheezing. 8.5 g 5    diclofenac (VOLTAREN) 50 MG EC tablet Take 1 tablet (50 mg total) by mouth Two (2) times a day. As needed for pain. 30 tablet 1    hydrocortisone (PROCTOSOL HC) 2.5 % rectal cream Insert 1 application into the rectum two (2) times a day as needed. (Patient not taking: Reported on 07/02/2022) 28.35 g 2     No current facility-administered medications for this visit.       Health Maintenance:     Health Maintenance   Topic Date Due    COVID-19 Vaccine (3 - Pfizer risk series) 01/24/2021    Urine Albumin/Creatinine Ratio  10/11/2022    Retinal Eye Exam  10/31/2022    Hemoglobin A1c  11/20/2022    Serum Creatinine Monitoring  01/16/2023    Potassium Monitoring  01/16/2023    Foot Exam  03/19/2023    DTaP/Tdap/Td Vaccines (4 - Td or Tdap) 07/17/2023    COPD Spirometry  08/06/2026    Lipid Screening  01/16/2027    Pneumococcal Vaccine 0-64  Completed Hepatitis C Screen  Completed    Influenza Vaccine  Completed       Immunizations:     Immunization History   Administered Date(s) Administered  COVID-19 VACC,MRNA,(PFIZER)(PF) 12/04/2020, 12/27/2020    HEPATITIS B VACCINE ADULT,IM(ENERGIX B, RECOMBIVAX) 03/05/2013, 04/05/2013, 09/03/2013    Hepatitis B Vaccine, Unspecified Formulation 12/27/1999, 04/29/2000    INFLUENZA INJ MDCK PF, QUAD,(FLUCELVAX)(69MO AND UP EGG FREE) 03/31/2019    INFLUENZA TIV (TRI) PF (IM) 10/08/2011    Influenza Vaccine Quad(IM)6 MO-Adult(PF) 03/05/2013, 03/18/2014, 03/22/2015, 03/05/2016, 03/19/2017, 02/23/2018, 02/19/2022    Influenza Virus Vaccine, unspecified formulation 03/19/2017, 02/23/2018, 03/25/2019, 03/10/2020, 04/05/2021    PNEUMOCOCCAL POLYSACCHARIDE 23-VALENT 12/30/2012    PPD Test 10/28/2016    Pneumococcal Conjugate 20-valent 01/02/2022    TD(TDVAX),ADSORBED,2LF(IM)(PF) 04/29/2000    TdaP 07/18/2008, 07/16/2013     I have reviewed and (if needed) updated the patient's problem list, medications, allergies, past medical and surgical history, social and family history.     Vital Signs:     Wt Readings from Last 3 Encounters:   07/02/22 (!) 209.1 kg (461 lb)   06/18/22 (!) 215.2 kg (474 lb 6.4 oz)   05/21/22 (!) 211.4 kg (466 lb)     Temp Readings from Last 3 Encounters:   07/02/22 36.7 ??C (98 ??F) (Oral)   06/18/22 37.1 ??C (98.7 ??F) (Temporal)   05/21/22 36.8 ??C (98.2 ??F) (Oral)     BP Readings from Last 3 Encounters:   07/02/22 132/86   06/18/22 150/78   05/21/22 116/74     Pulse Readings from Last 3 Encounters:   07/02/22 74   06/18/22 81   05/21/22 92     Estimated body mass index is 64.33 kg/m?? as calculated from the following:    Height as of this encounter: 180.3 cm (5' 10.98).    Weight as of this encounter: 209.1 kg (461 lb).  Facility age limit for growth %iles is 20 years.      Objective:      General: Alert and oriented x3. Well-appearing. No acute distress.   HEENT:  Normocephalic.  Atraumatic. Conjunctiva and sclera normal. OP MMM without lesions.   Neck:  Supple. No thyroid enlargement. No adenopathy.   GI/GU:  Soft, obese, +BS, nondistended, non-TTP. No palpable masses or organomegaly.   Extremities:  Lymphedema bilateral. Peripheral pulses normal. Left great toe mildly erythematous, swollen and TTP. No tophi.    Skin:  Warm, dry. No rash or lesions present.   Neuro:  Non-focal. No obvious weakness.   Psych:  Affect normal, eye contact good, speech clear and coherent.       [name], FNP

## 2022-07-03 DIAGNOSIS — S93402A Sprain of unspecified ligament of left ankle, initial encounter: Secondary | ICD-10-CM | POA: Insufficient documentation

## 2022-07-08 DIAGNOSIS — L732 Hidradenitis suppurativa: Principal | ICD-10-CM

## 2022-07-08 MED ORDER — COLCHICINE 0.6 MG CAPSULE
ORAL_CAPSULE | 0 refills | 0 days
Start: 2022-07-08 — End: ?

## 2022-07-08 MED ORDER — HUMIRA(CF) PEN 80 MG/0.8 ML SUBCUTANEOUS KIT
SUBCUTANEOUS | 0 refills | 0 days
Start: 2022-07-08 — End: ?

## 2022-07-09 MED ORDER — COLCHICINE 0.6 MG CAPSULE
ORAL_CAPSULE | 0 refills | 0 days
Start: 2022-07-09 — End: ?

## 2022-07-09 MED ORDER — HUMIRA(CF) PEN 80 MG/0.8 ML SUBCUTANEOUS KIT
SUBCUTANEOUS | 0 refills | 0.00000 days
Start: 2022-07-09 — End: ?

## 2022-07-09 NOTE — Unmapped (Signed)
Patient is requesting the following refill  Requested Prescriptions     Pending Prescriptions Disp Refills    colchicine (MITIGARE) 0.6 mg cap capsule 60 capsule 0     Sig: TAKE 2 CAPSULES BY MOUTH ON DAY ONE, FOLLOWED BY ONE CAPSULE ONE HOUR LATER, ON DAY 2 TAKE ONE CAPSULE BY MOUTH TWICEA DAY UNTIL RESOLVED       Recent Visits  Date Type Provider Dept   07/02/22 Office Visit Loran Senters, FNP [facility] [address] [facility]   05/21/22 Office Visit Loran Senters, FNP [facility] [address] [facility]   02/27/22 Office Visit Farrug, Emogene Morgan, NP [place] [facility] [address] [facility]   02/19/22 Office Visit Loran Senters, FNP [place] [facility] [address] [facility]   01/02/22 Office Visit Loran Senters, FNP [facility] [address] At [facility]   11/28/21 Office Visit Loran Senters, FNP [place] [facility] [address] [facility]   10/10/21 Office Visit Loran Senters, FNP [facility] [address] [facility]   09/11/21 Office Visit Loran Senters, FNP [facility] [address] [facility]   07/13/21 Office Visit Loran Senters, FNP [facility]   Showing recent visits within past 365 days with a meds authorizing provider and meeting all other requirements  Future Appointments  Date Type Provider Dept   07/31/22 Appointment Loran Senters, FNP [facility] [address] [facility]   08/21/22 Appointment Ninetta Lights, Esaw Grandchild, FNP [place] [facility] [address] [facility]   Showing future appointments within next 365 days with a meds authorizing provider and meeting all other requirements       Labs: Not applicable this refill

## 2022-07-12 DIAGNOSIS — M109 Gout, unspecified: Principal | ICD-10-CM

## 2022-07-12 MED ORDER — ALLOPURINOL 100 MG TABLET
ORAL_TABLET | Freq: Every day | ORAL | 1 refills | 90 days | Status: CP
Start: 2022-07-12 — End: ?

## 2022-07-12 MED ORDER — COLCHICINE 0.6 MG CAPSULE
ORAL_CAPSULE | Freq: Every day | ORAL | 0 refills | 0.00000 days | Status: CP
Start: 2022-07-12 — End: ?

## 2022-07-12 NOTE — Unmapped (Signed)
I LM to inform Logan Quinn his Humira had been denied by clinic and he needs to make a followup visit. I left office contact number.    Will continue to look for new Rx.    Logan Quinn, PharmD, BCPS - Pharmacist   [facility]

## 2022-07-15 ENCOUNTER — Ambulatory Visit: Admit: 2022-07-15 | Payer: MEDICAID | Attending: Physician Assistant | Primary: Physician Assistant

## 2022-07-20 DIAGNOSIS — Z794 Long term (current) use of insulin: Principal | ICD-10-CM

## 2022-07-20 DIAGNOSIS — Z6841 Body Mass Index (BMI) 40.0 and over, adult: Principal | ICD-10-CM

## 2022-07-20 DIAGNOSIS — E1165 Type 2 diabetes mellitus with hyperglycemia: Principal | ICD-10-CM

## 2022-07-20 MED ORDER — TRULICITY 4.5 MG/0.5 ML SUBCUTANEOUS PEN INJECTOR
2 refills | 0 days
Start: 2022-07-20 — End: ?

## 2022-07-20 MED ORDER — DULAGLUTIDE 4.5 MG/0.5 ML SUBCUTANEOUS PEN INJECTOR
SUBCUTANEOUS | 2 refills | 0 days
Start: 2022-07-20 — End: ?

## 2022-07-22 ENCOUNTER — Ambulatory Visit: Payer: 59 | Admitting: Podiatry

## 2022-07-22 MED ORDER — DULAGLUTIDE 4.5 MG/0.5 ML SUBCUTANEOUS PEN INJECTOR
SUBCUTANEOUS | 2 refills | 0 days
Start: 2022-07-22 — End: ?

## 2022-07-22 MED ORDER — TRULICITY 4.5 MG/0.5 ML SUBCUTANEOUS PEN INJECTOR
SUBCUTANEOUS | 2 refills | 0 days | Status: CP
Start: 2022-07-22 — End: 2023-07-22

## 2022-07-23 NOTE — Unmapped (Unsigned)
Dermatology Note     Assessment and Plan:      Hidradenitis suppurativa, Hurley stage II, currently flaring  - chronic illness with exacerbation   - s/p unroofing of right scrotum  - Continue humira 80mg  weekly R/B/A reviewed including but not limited to risk infection.   - Continue clindamycin lotion topically nightly to the flaring lesions  - Can consider remicade in the future. He recently restarted 80mg  humira weekly so would like to give this more time to work first.   - Will treat acute flare with ILK today, as below:    Intralesional Kenalog Procedure Note: After the patient was informed of risks (including atrophy and dyspigmentation), benefits and side effects of intralesional steroid injection, the patient elected to undergo injection and verbal consent was obtained. Skin was cleaned with alcohol and injected intralesionally into the sites (below). The patient tolerated the procedure well without complications and was instructed on post-procedure care.  Location(s): right mons  Number of sites treated: 1  Kenalog (triamcinolone) Concentration: 20 mg/ml   Volume: 0.1 ml total    High Risk Medication Use (Humira)  - Will re-check quant gold today    Intertrigo  Chronic problem with exacerbation    - this is a chronic illness that is expected to last greater than 1 year and possibly the life of the patient.   - Start ketoconazole cream- apply to the affected area of the groin daily until clear  - Recommend using zinc oxide barrier paste daily to prevent irritation while HS is flaring and draining.    There are no diagnoses linked to this encounter.    The patient was advised to call for an appointment should any new, changing, or symptomatic lesions develop.     RTC: No follow-ups on file. or sooner as needed   _________________________________________________________________      Chief Complaint     Chief Complaint   Patient presents with    Hidradenitis suppurativa     Pt states that he has been dealing with small flare ups but nothing to concerning or different        HPI     Logan Quinn is a 42 y.o. male who presents as a returning patient (last seen by Dr. Elder Cyphers on 10/25/2020) to Dermatology for follow up of hidradenitis suppurativa. At last visit, patient was to continue humira 80 mg weekly, continue clindamycin lotion, and underwent ILK to the right mons for HS. Additionally, patient was to start ketoconazole cream for intertrigo.     Today, patient reports ***     The patient denies any other new or changing lesions or areas of concern.     Pertinent Past Medical History     No history of skin cancer    Family History:   Negative for melanoma    Past Medical History, Family History, Social History, Medication List, Allergies, and Problem List were reviewed in the rooming section of Epic.     ROS: Other than symptoms mentioned in the HPI, no fevers, chills, or other skin complaints    Physical Examination     GENERAL: Well-appearing male in no acute distress, resting comfortably.  NEURO: Alert and oriented, answers questions appropriately  PSYCH: Normal mood and affect  SKIN: Examination of the {derm skin check PE skin list:75420} was performed  {PE list:75421}  ***    All areas not commented on are within normal limits or unremarkable      (Approved Template 02/21/2020)

## 2022-07-24 ENCOUNTER — Ambulatory Visit
Admit: 2022-07-24 | Discharge: 2022-07-25 | Payer: MEDICAID | Attending: Student in an Organized Health Care Education/Training Program | Primary: Student in an Organized Health Care Education/Training Program

## 2022-07-24 DIAGNOSIS — L304 Erythema intertrigo: Principal | ICD-10-CM

## 2022-07-24 DIAGNOSIS — Z79899 Other long term (current) drug therapy: Principal | ICD-10-CM

## 2022-07-24 DIAGNOSIS — L732 Hidradenitis suppurativa: Principal | ICD-10-CM

## 2022-07-24 MED ORDER — CLINDAMYCIN 1 % LOTION
Freq: Two times a day (BID) | TOPICAL | 3 refills | 0.00000 days | Status: CP
Start: 2022-07-24 — End: 2023-07-24

## 2022-07-24 MED ORDER — HUMIRA(CF) PEN 80 MG/0.8 ML SUBCUTANEOUS KIT
SUBCUTANEOUS | 11 refills | 0.00000 days | Status: CP
Start: 2022-07-24 — End: ?
  Filled 2022-07-25: qty 4, 28d supply, fill #0

## 2022-07-24 MED ORDER — KETOCONAZOLE 2 % TOPICAL CREAM
Freq: Every day | TOPICAL | 3 refills | 120.00000 days | Status: CP
Start: 2022-07-24 — End: 2023-07-24

## 2022-07-24 NOTE — Unmapped (Signed)
Logan Quinn was seen in clinic for followup and continues to do well. He's had no major flares recently and is happy with medication.     [facility] Clinical Assessment & Refill Coordination Note    Timmothy Nonnemacher, DOB: Sep 14, 1980  Phone: [phone] (home)     All above HIPAA information was verified with patient.     Was a Nurse, learning disability used for this call? No    Specialty Medication(s):   Inflammatory Disorders: Humira     Current Outpatient Medications   Medication Sig Dispense Refill    adalimumab (HUMIRA,CF, PEN) 80 mg/0.8 mL PnKt Inject 80 mg subcutaneously once every week 4 each 11    albuterol 2.5 mg /3 mL (0.083 %) nebulizer solution Inhale 3 mL (2.5 mg total) by nebulization every four (4) hours as needed for wheezing. 180 mL 2    albuterol HFA 90 mcg/actuation inhaler Inhale 2 puffs every six (6) hours as needed for wheezing. 8.5 g 5    allopurinol (ZYLOPRIM) 100 MG tablet Take 1 tablet (100 mg total) by mouth daily. 90 tablet 1    ALPRAZolam (XANAX) 0.5 MG tablet Takes 1 tablet nightly and 1 tablet a day when needed      anastrozole (ARIMIDEX) 1 mg tablet Take 1 tablet (1 mg total) by mouth every other day. 45 tablet 3    ascorbic acid (VITAMIN C ORAL) Take 1 capsule by mouth nightly.      BD LUER-LOK SYRINGE 3 mL 21 gauge x 1 1/2 Syrg weekly      blood sugar diagnostic (ACCU-CHEK GUIDE TEST STRIPS) Strp by Other route Three (3) times a day before meals. 300 each 1    blood-glucose meter kit Use as directed 1 each 0    budesonide-formoteroL (SYMBICORT) 80-4.5 mcg/actuation inhaler Inhale 2 puffs Two (2) times a day. 10.2 g 11    chlorhexidine (PERIDEX) 0.12 % solution 15 mL by Oromucosal route two (2) times a day. 900 mL 3    clindamycin (CLEOCIN T) 1 % lotion APPLY TOPICALLY AT NIGHT TO INFLAMED AREAS AS NEEDED FOR FLARES      clindamycin (CLEOCIN T) 1 % lotion Apply topically two (2) times a day. To affected areas on body as needed for flares 60 mL 3    clobetasoL (TEMOVATE) 0.05 % Gel       clobetasoL (TEMOVATE) 0.05 % ointment Apply daily to painful affected areas if needed for flares, then stop 15 g 1    colchicine (MITIGARE) 0.6 mg cap capsule Take 1 capsule (0.6 mg total) by mouth daily. 90 capsule 0    cyclobenzaprine (FLEXERIL) 10 MG tablet Take 1 tablet (10 mg total) by mouth Three (3) times a day as needed for muscle spasms. 60 tablet 1    dextroamphetamine sulfate (DEXTROSTAT) 10 MG tablet Take 2 tablets (20 mg total) by mouth two (2) times a day.      dicyclomine (BENTYL) 10 mg capsule Take 1 capsule (10 mg total) by mouth Four (4) times a day (before meals and nightly). 120 capsule 3    diphenoxylate-atropine (LOMOTIL) 2.5-0.025 mg per tablet Take 1 tablet by mouth two (2) times a day as needed for diarrhea. 30 tablet 5    dulaglutide (TRULICITY) 4.5 mg/0.5 mL PnIj Inject 4.5 mg under the skin every seven (7) days. 2 mL 2    DULoxetine (CYMBALTA) 60 MG capsule Take 1 capsule (60 mg total) by mouth Two (2) times a day. 180  capsule 0    empty container Misc Use as directed to dispose of needles. When full, make sure lid is closed tightly then dispose of container in trash. 1 each 2    erenumab-aooe (AIMOVIG AUTOINJECTOR) 140 mg/mL AtIn Inject 1 Pen under the skin every thirty (30) days. 3 mL 1    famotidine (PEPCID) 20 MG tablet Take 1 tablet (20 mg total) by mouth two (2) times a day. 180 tablet 1    fluoride, sodium, 1.1 % Pste       fluticasone propionate (FLONASE) 50 mcg/actuation nasal spray 1 spray into each nostril Two (2) times a day. 16 g 5    glipiZIDE (GLUCOTROL) 10 MG tablet Take 2 tablets (20 mg total) by mouth Two (2) times a day (30 minutes before a meal). 360 tablet 1    HUMALOG U-100 INSULIN 100 unit/mL injection INJECT 120 UNITS UNDER SKIN DAILY VIA OMNIPOD 40 mL 5    hydroCHLOROthiazide (HYDRODIURIL) 25 MG tablet Take 1 tablet (25 mg total) by mouth daily. (Patient taking differently: Take 0.5 tablets (12.5 mg total) by mouth daily.) 90 tablet 1 hydrocortisone (PROCTOSOL HC) 2.5 % rectal cream Insert 1 application into the rectum two (2) times a day as needed. 28.35 g 2    hydrOXYzine (ATARAX) 25 MG tablet Take 1 tablet (25 mg total) by mouth Three (3) times a day as needed. May take two before bed for sleep. 120 tablet 1    ibuprofen (ADVIL,MOTRIN) 800 MG tablet Take 1 tablet (800 mg total) by mouth every eight (8) hours as needed. 90 tablet 1    insulin glargine (LANTUS U-100 INSULIN) 100 unit/mL injection Inject 0.05 mL (5 Units total) under the skin nightly. 10 mL 1    insulin pump cart,automated,BT (OMNIPOD 5 G6 PODS, GEN 5,) Crtg Change POD every 48 hours. 3 each 11    insulin syringe-needle U-100 1 mL 31 gauge x 5/16 (8 mm) Syrg Once daily for insulin dosing. 100 each 3    ketoconazole (NIZORAL) 2 % cream Apply 1 Application topically daily. To affected area on body until clear 60 g 3    lancets (ACCU-CHEK FASTCLIX LANCET DRUM) Misc Use to check blood sugars 3 times a day before meals or as directed 200 each 5    levothyroxine (SYNTHROID) 50 MCG tablet Take 1 tablet (50 mcg total) by mouth daily. 90 tablet 1    lidocaine (XYLOCAINE) 5 % ointment Apply topically Three (3) times a day as needed. 30 g 1    montelukast (SINGULAIR) 10 mg tablet Take 1 tablet (10 mg total) by mouth daily. 90 tablet 3    multivitamin (MULTIPLE VITAMIN ORAL) Take 1 tablet by mouth daily.      ondansetron (ZOFRAN) 4 MG tablet Take 1 tablet (4 mg total) by mouth every eight (8) hours as needed for nausea. 30 tablet 1    oxybutynin (DITROPAN) 5 MG tablet Take 1 tablet (5 mg total) by mouth Two (2) times a day. 180 tablet 1    pantoprazole (PROTONIX) 40 MG tablet Take 1 tablet (40 mg total) by mouth two (2) times a day. 180 tablet 1    polyethylene glycol (GLYCOLAX) 17 gram/dose powder       promethazine (PHENERGAN) 25 MG tablet Take 1 tablet (25 mg total) by mouth every eight (8) hours as needed for nausea. 30 tablet 1    propranoloL (INDERAL LA) 120 mg 24 hr capsule Take 1 capsule (120 mg total) by mouth daily.  90 capsule 1    ramelteon (ROZEREM) 8 mg tablet Take 1 tablet (8 mg total) by mouth nightly.      safety needles (BD SAFETYGLIDE NEEDLE) 23 gauge x 1 Ndle 1 Units by Miscellaneous route once a week. 12 each 11    safety needles 18 gauge x 1 1/2 Ndle 1 Units by Miscellaneous route once a week. 12 each 11    sour cherry extract (TART CHERRY EXTRACT) 1,000 mg cap       tadalafiL (CIALIS) 5 MG tablet Take 1 tablet (5 mg total) by mouth in the morning. 90 tablet 3    testosterone cypionate (DEPOTESTOTERONE CYPIONATE) 200 mg/mL injection Inject 0.5 mL (100 mg total) into the muscle once a week. Inject 0.5cc (100mg ) weekly 2 mL 5    topiramate (TOPAMAX) 50 MG tablet Take 1.5 tablets (75 mg total) by mouth two (2) times a day. 270 tablet 1    WELLBUTRIN XL 150 mg 24 hr tablet Take 1 tablet (150 mg total) by mouth every morning.       No current facility-administered medications for this visit.        Changes to medications: Vyron reports no changes at this time.    Allergies   Allergen Reactions    Erythromycin Nausea And Vomiting     Other reaction(s): Vomiting    Penicillins Rash, Swelling and Hives    Sulfa (Sulfonamide Antibiotics) Nausea And Vomiting and Nausea Only    Other        Changes to allergies: No    SPECIALTY MEDICATION ADHERENCE     Humira - 0 left  Medication Adherence    Patient reported X missed doses in the last month: 0  Specialty Medication: Humira          Specialty medication(s) dose(s) confirmed: Regimen is correct and unchanged.     Are there any concerns with adherence? No    Adherence counseling provided? Not needed    CLINICAL MANAGEMENT AND INTERVENTION      Clinical Benefit Assessment:    Do you feel the medicine is effective or helping your condition? Yes    Clinical Benefit counseling provided? Not needed    Adverse Effects Assessment:    Are you experiencing any side effects? No    Are you experiencing difficulty administering your medicine? No    Quality of Life Assessment:    Quality of Life    Rheumatology  Oncology  Dermatology  1. What impact has your specialty medication had on the symptoms of your skin condition (i.e. itchiness, soreness, stinging)?: Some  2. What impact has your specialty medication had on your comfort level with your skin?: Some  Cystic Fibrosis          How many days over the past month did your HS  keep you from your normal activities? For example, brushing your teeth or getting up in the morning. 0    Have you discussed this with your provider? Not needed    Acute Infection Status:    Acute infections noted within Epic:  No active infections  Patient reported infection: None    Therapy Appropriateness:    Is therapy appropriate and patient progressing towards therapeutic goals? Yes, therapy is appropriate and should be continued    DISEASE/MEDICATION-SPECIFIC INFORMATION      For patients on injectable medications: Patient currently has 0 doses left.  Next injection is scheduled for 2/15.    Chronic Inflammatory Diseases: Have you experienced any  flares in the last month? No  Has this been reported to your provider? No    PATIENT SPECIFIC NEEDS     Does the patient have any physical, cognitive, or cultural barriers? No    Is the patient high risk? No    Did the patient require a clinical intervention? No    Does the patient require physician intervention or other additional services (i.e., nutrition, smoking cessation, social work)? No    SOCIAL DETERMINANTS OF HEALTH     At the [facility], we have learned that life circumstances - like trouble affording food, housing, utilities, or transportation can affect the health of many of our patients.   That is why we wanted to ask: are you currently experiencing any life circumstances that are negatively impacting your health and/or quality of life? Patient declined to answer    Social Determinants of Health     Financial Resource Strain: Low Risk  (07/02/2022)    Overall Financial Resource Strain (CARDIA)     Difficulty of Paying Living Expenses: Not hard at all   Internet Connectivity: Not on file   Food Insecurity: No Food Insecurity (07/02/2022)    Hunger Vital Sign     Worried About Running Out of Food in the Last Year: Never true     Ran Out of Food in the Last Year: Never true   Tobacco Use: Low Risk  (07/24/2022)    Patient History     Smoking Tobacco Use: Never     Smokeless Tobacco Use: Never     Passive Exposure: Never   Housing/Utilities: Low Risk  (07/02/2022)    Housing/Utilities     Within the past 12 months, have you ever stayed: outside, in a car, in a tent, in an overnight shelter, or temporarily in someone else's home (i.e. couch-surfing)?: No     Are you worried about losing your housing?: No     Within the past 12 months, have you been unable to get utilities (heat, electricity) when it was really needed?: No   Alcohol Use: Not At Risk (07/02/2022)    Alcohol Use     How often do you have a drink containing alcohol?: Never     How many drinks containing alcohol do you have on a typical day when you are drinking?: Not on file     How often do you have 5 or more drinks on one occasion?: Never   Transportation Needs: No Transportation Needs (07/02/2022)    PRAPARE - Transportation     Lack of Transportation (Medical): No     Lack of Transportation (Non-Medical): No   Substance Use: Low Risk  (07/02/2022)    Substance Use     Taken prescription drugs for non-medical reasons: Never     Taken illegal drugs: Never     Patient indicated they have taken drugs in the past year for non-medical reasons: Yes, [positive answer(s)]: Not on file   Health Literacy: Not on file   Physical Activity: Not on file   Interpersonal Safety: Not on file   Stress: Not on file   Intimate Partner Violence: Unknown (09/13/2021)    Received from [facility]    HITS     Physically Hurt: Not on file     Insult or Talk Down To: Not on file     Threaten Physical Harm: Not on file     Scream or Curse: Not on file   Depression: Not at risk (12/10/2021)  PHQ-2     PHQ-2 Score: 0   Social Connections: Unknown (10/09/2021)    Received from [facility]    Social Network     Social Network: Not on file       Would you be willing to receive help with any of the needs that you have identified today? Not applicable       SHIPPING     Specialty Medication(s) to be Shipped:   Inflammatory Disorders: Humira    Other medication(s) to be shipped: No additional medications requested for fill at this time     Changes to insurance: No    Patient was informed of new phone menu: No    Delivery Scheduled: Yes, Expected medication delivery date: 2/15.     Medication will be delivered via Same Day Courier to the confirmed prescription address in [facility].    The patient will receive a drug information handout for each medication shipped and additional FDA Medication Guides as required.  Verified that patient has previously received a Conservation officer, historic buildings and a Surveyor, mining.    The patient or caregiver noted above participated in the development of this care plan and knows that they can request review of or adjustments to the care plan at any time.      All of the patient's questions and concerns have been addressed.    Lanney Gins   [facility] Specialty Pharmacist

## 2022-07-29 LAB — QUANTIFERON TB GOLD PLUS
QUANTIFERON ANTIGEN 1 MINUS NIL: -0.01 [IU]/mL
QUANTIFERON ANTIGEN 2 MINUS NIL: -0.02 [IU]/mL
QUANTIFERON MITOGEN: 9.88 [IU]/mL
QUANTIFERON TB GOLD PLUS: NEGATIVE
QUANTIFERON TB NIL VALUE: 0.12 [IU]/mL

## 2022-07-29 LAB — TB AG2: TB AG2 VALUE: 0.1

## 2022-07-29 LAB — TB MITOGEN: TB MITOGEN VALUE: 10

## 2022-07-29 LAB — TB AG1: TB AG1 VALUE: 0.11

## 2022-07-29 LAB — TB NIL: TB NIL VALUE: 0.12

## 2022-07-29 NOTE — Unmapped (Signed)
[employer] sent fax for Ulticare 1 ml syringe and 31 x 5/16 needle to use once daily with Lantus .  Left message on [facility] voice mail RX sent on 04/02/2022 for #100 and 3 refills.

## 2022-07-31 ENCOUNTER — Ambulatory Visit: Admit: 2022-07-31 | Discharge: 2022-08-01 | Payer: MEDICAID | Attending: Family | Primary: Family

## 2022-07-31 DIAGNOSIS — M109 Gout, unspecified: Principal | ICD-10-CM

## 2022-07-31 LAB — URIC ACID: URIC ACID: 8.6 mg/dL

## 2022-07-31 NOTE — Unmapped (Signed)
Assessment and Plan:     Acute gout involving toe of left foot, unspecified cause  Check uric acid level. Goal of 6.0 or less. Anticipate increasing allopurinol dosage. Continue colchicine 0.6 mg daily.   - Uric acid         I personally spent 20 minutes face-to-face and non-face-to-face in the care of this patient, which includes all pre, intra, and post visit time on the date of service.    Return for has return appointment for mid March.    HPI:      Leodis Dileonardo is here for   Chief Complaint   Patient presents with    Follow-up     One month follow-up on gout.  No current issues.     Gout: patient presents for follow up after starting allopurinol one month ago. He is currently on 100 mg daily. He is also taking colchicine 0.6 mg daily. Denies any gout flares since initiating preventative medication. Denies side effects of medication.      ROS:      Comprehensive 10 point ROS negative unless otherwise stated in the HPI.      [facility] Components:     Medication adherence and barriers to the treatment plan have been addressed. Opportunities to optimize healthy behaviors have been discussed. Patient / caregiver voiced understanding.    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)     Obstructive sleep apnea     OSA on CPAP     Retinal detachment 04/2014    Severe obstructive sleep apnea     Trapezius muscle strain 12/07/2013    Urinary incontinence, nocturnal enuresis     Venous insufficiency      Past Surgical History:   Procedure Laterality Date    EYE SURGERY  04/2014    LASER ABLATION INCOMPETENT VEIN INI [facility] HISTORICAL RESULT) Right     PR COLONOSCOPY FLX DX W/COLLJ SPEC WHEN PFRMD N/A 03/24/2013    Procedure: COLONOSCOPY, FLEXIBLE, PROXIMAL TO SPLENIC FLEXURE; DIAGNOSTIC, W/WO COLLECTION SPECIMEN BY BRUSH OR WASH;  Surgeon: Clint Bolder, MD;  Location: [facility];  Service: Gastroenterology    PR EYE SURG POST SGMT PROC UNLISTED Left     pneumatic retinopexy OS    PR UPPER GI ENDOSCOPY,DIAGNOSIS N/A 02/02/2013    Procedure: UGI ENDO, INCLUDE ESOPHAGUS, STOMACH, & DUODENUM &/OR JEJUNUM; DX W/WO COLLECTION SPECIMN, BY BRUSH OR WASH;  Surgeon: Malcolm Metro, MD;  Location: [facility];  Service: Gastroenterology    [place] PYLORIC STENOSIS ([place] HISTORICAL RESULT)         Family History:     Family History   Problem Relation Age of Onset    Cancer Maternal Grandfather  Stomach Cancer    Hearing loss Maternal Grandfather     Cancer Paternal Grandfather         Bone Cancer, Lung Cancer    COPD Paternal Grandmother     Arthritis Paternal Grandmother     Depression Paternal Grandmother     Diabetes Mother     Heart disease Mother     Migraines Mother     Arthritis Mother     Depression Mother     GER disease Mother     Hypertension Mother     Angina Mother     COPD Mother     Glaucoma Mother     Hearing loss Mother     Cataracts Mother     Diabetes Sister     Migraines Sister     Asthma Sister     Depression Sister     Hearing loss Sister     Diabetes Brother     Asthma Brother     Diabetes Maternal Grandmother     Heart disease Maternal Grandmother     Migraines Maternal Grandmother     Depression Maternal Grandmother     Angina Maternal Grandmother     Hypertension Maternal Grandmother     Stroke Maternal Grandmother     Diabetes Maternal Uncle     Hearing loss Maternal Uncle     Diabetes Maternal Uncle         resulted in need for kidney transplant    Liver disease Maternal Uncle Kidney disease Maternal Uncle         needed kidney transplant    Asthma Brother     No Known Problems Father     No Known Problems Paternal Aunt     No Known Problems Paternal Uncle     No Known Problems Other     Colorectal Cancer Neg Hx     Esophageal cancer Neg Hx     Liver cancer Neg Hx     Pancreatic cancer Neg Hx     Stomach cancer Neg Hx     Amblyopia Neg Hx     Blindness Neg Hx     Retinal detachment Neg Hx     Strabismus Neg Hx     Macular degeneration Neg Hx     Anesthesia problems Neg Hx     Broken bones Neg Hx     Clotting disorder Neg Hx     Collagen disease Neg Hx     Dislocations Neg Hx     Fibromyalgia Neg Hx     Gout Neg Hx     Hemophilia Neg Hx     Osteoporosis Neg Hx     Rheumatologic disease Neg Hx     Scoliosis Neg Hx     Severe sprains Neg Hx     Sickle cell anemia Neg Hx     Spinal Compression Fracture Neg Hx     Melanoma Neg Hx     Basal cell carcinoma Neg Hx     Squamous cell carcinoma Neg Hx     Deep vein thrombosis Neg Hx     Thyroid disease Neg Hx        Social History:     Social History     Tobacco Use    Smoking status: Never     Passive exposure: Never    Smokeless tobacco: Never   Vaping Use    Vaping status: Never Used   Substance Use Topics    Alcohol  use: Never     Comment: rare social    Drug use: Never       Allergies:     Erythromycin, Penicillins, Sulfa (sulfonamide antibiotics), and Other    Current Medications:     Current Outpatient Medications   Medication Sig Dispense Refill    adalimumab (HUMIRA,CF, PEN) 80 mg/0.8 mL PnKt Inject 80 mg subcutaneously once every week 4 each 11    albuterol 2.5 mg /3 mL (0.083 %) nebulizer solution Inhale 3 mL (2.5 mg total) by nebulization every four (4) hours as needed for wheezing. 180 mL 2    albuterol HFA 90 mcg/actuation inhaler Inhale 2 puffs every six (6) hours as needed for wheezing. 8.5 g 5    allopurinol (ZYLOPRIM) 100 MG tablet Take 1 tablet (100 mg total) by mouth daily. 90 tablet 1    ALPRAZolam (XANAX) 0.5 MG tablet Takes 1 tablet nightly and 1 tablet a day when needed      anastrozole (ARIMIDEX) 1 mg tablet Take 1 tablet (1 mg total) by mouth every other day. 45 tablet 3    ascorbic acid (VITAMIN C ORAL) Take 1 capsule by mouth nightly.      BD LUER-LOK SYRINGE 3 mL 21 gauge x 1 1/2 Syrg weekly      blood sugar diagnostic (ACCU-CHEK GUIDE TEST STRIPS) Strp by Other route Three (3) times a day before meals. 300 each 1    blood-glucose meter kit Use as directed 1 each 0    budesonide-formoteroL (SYMBICORT) 80-4.5 mcg/actuation inhaler Inhale 2 puffs Two (2) times a day. 10.2 g 11    chlorhexidine (PERIDEX) 0.12 % solution 15 mL by Oromucosal route two (2) times a day. 900 mL 3    clindamycin (CLEOCIN T) 1 % lotion Apply topically two (2) times a day. To affected areas on body as needed for flares 60 mL 3    clobetasoL (TEMOVATE) 0.05 % Gel       colchicine (MITIGARE) 0.6 mg cap capsule Take 1 capsule (0.6 mg total) by mouth daily. 90 capsule 0    cyclobenzaprine (FLEXERIL) 10 MG tablet Take 1 tablet (10 mg total) by mouth Three (3) times a day as needed for muscle spasms. 60 tablet 1    dextroamphetamine sulfate (DEXTROSTAT) 10 MG tablet Take 2 tablets (20 mg total) by mouth two (2) times a day.      dicyclomine (BENTYL) 10 mg capsule Take 1 capsule (10 mg total) by mouth Four (4) times a day (before meals and nightly). 120 capsule 3    diphenoxylate-atropine (LOMOTIL) 2.5-0.025 mg per tablet Take 1 tablet by mouth two (2) times a day as needed for diarrhea. 30 tablet 5    dulaglutide (TRULICITY) 4.5 mg/0.5 mL PnIj Inject 4.5 mg under the skin every seven (7) days. 2 mL 2    DULoxetine (CYMBALTA) 60 MG capsule Take 1 capsule (60 mg total) by mouth Two (2) times a day. 180 capsule 0    empty container Misc Use as directed to dispose of needles. When full, make sure lid is closed tightly then dispose of container in trash. 1 each 2    erenumab-aooe (AIMOVIG AUTOINJECTOR) 140 mg/mL AtIn Inject 1 Pen under the skin every thirty (30) days. 3 mL 1    famotidine (PEPCID) 20 MG tablet Take 1 tablet (20 mg total) by mouth two (2) times a day. 180 tablet 1    fluoride, sodium, 1.1 % Pste       fluticasone  propionate (FLONASE) 50 mcg/actuation nasal spray 1 spray into each nostril Two (2) times a day. 16 g 5    glipiZIDE (GLUCOTROL) 10 MG tablet Take 2 tablets (20 mg total) by mouth Two (2) times a day (30 minutes before a meal). 360 tablet 1    HUMALOG U-100 INSULIN 100 unit/mL injection INJECT 120 UNITS UNDER SKIN DAILY VIA OMNIPOD 40 mL 5    hydrocortisone (PROCTOSOL HC) 2.5 % rectal cream Insert 1 application into the rectum two (2) times a day as needed. 28.35 g 2    hydrOXYzine (ATARAX) 25 MG tablet Take 1 tablet (25 mg total) by mouth Three (3) times a day as needed. May take two before bed for sleep. 120 tablet 1    ibuprofen (ADVIL,MOTRIN) 800 MG tablet Take 1 tablet (800 mg total) by mouth every eight (8) hours as needed. 90 tablet 1    insulin glargine (LANTUS U-100 INSULIN) 100 unit/mL injection Inject 0.05 mL (5 Units total) under the skin nightly. 10 mL 1    insulin pump cart,automated,BT (OMNIPOD 5 G6 PODS, GEN 5,) Crtg Change POD every 48 hours. 3 each 11    insulin syringe-needle U-100 1 mL 31 gauge x 5/16 (8 mm) Syrg Once daily for insulin dosing. 100 each 3    ketoconazole (NIZORAL) 2 % cream Apply 1 Application topically daily. To affected area on body until clear 60 g 3    lancets (ACCU-CHEK FASTCLIX LANCET DRUM) Misc Use to check blood sugars 3 times a day before meals or as directed 200 each 5    levothyroxine (SYNTHROID) 50 MCG tablet Take 1 tablet (50 mcg total) by mouth daily. 90 tablet 1    lidocaine (XYLOCAINE) 5 % ointment Apply topically Three (3) times a day as needed. 30 g 1    montelukast (SINGULAIR) 10 mg tablet Take 1 tablet (10 mg total) by mouth daily. 90 tablet 3    multivitamin (MULTIPLE VITAMIN ORAL) Take 1 tablet by mouth daily.      ondansetron (ZOFRAN) 4 MG tablet Take 1 tablet (4 mg total) by mouth every eight (8) hours as needed for nausea. 30 tablet 1    oxybutynin (DITROPAN) 5 MG tablet Take 1 tablet (5 mg total) by mouth Two (2) times a day. 180 tablet 1    pantoprazole (PROTONIX) 40 MG tablet Take 1 tablet (40 mg total) by mouth two (2) times a day. 180 tablet 1    polyethylene glycol (GLYCOLAX) 17 gram/dose powder       promethazine (PHENERGAN) 25 MG tablet Take 1 tablet (25 mg total) by mouth every eight (8) hours as needed for nausea. 30 tablet 1    propranoloL (INDERAL LA) 120 mg 24 hr capsule Take 1 capsule (120 mg total) by mouth daily. 90 capsule 1    ramelteon (ROZEREM) 8 mg tablet Take 1 tablet (8 mg total) by mouth nightly.      safety needles (BD SAFETYGLIDE NEEDLE) 23 gauge x 1 Ndle 1 Units by Miscellaneous route once a week. 12 each 11    safety needles 18 gauge x 1 1/2 Ndle 1 Units by Miscellaneous route once a week. 12 each 11    sour cherry extract (TART CHERRY EXTRACT) 1,000 mg cap       tadalafiL (CIALIS) 5 MG tablet Take 1 tablet (5 mg total) by mouth in the morning. 90 tablet 3    testosterone cypionate (DEPOTESTOTERONE CYPIONATE) 200 mg/mL injection Inject 0.5 mL (100 mg total) into  the muscle once a week. Inject 0.5cc (100mg ) weekly 2 mL 5    topiramate (TOPAMAX) 50 MG tablet Take 1.5 tablets (75 mg total) by mouth two (2) times a day. 270 tablet 1    WELLBUTRIN XL 150 mg 24 hr tablet Take 1 tablet (150 mg total) by mouth every morning.      clindamycin (CLEOCIN T) 1 % lotion APPLY TOPICALLY AT NIGHT TO INFLAMED AREAS AS NEEDED FOR FLARES      clobetasoL (TEMOVATE) 0.05 % ointment Apply daily to painful affected areas if needed for flares, then stop (Patient not taking: Reported on 07/31/2022) 15 g 1    hydroCHLOROthiazide (HYDRODIURIL) 25 MG tablet Take 1 tablet (25 mg total) by mouth daily. (Patient not taking: Reported on 07/31/2022) 90 tablet 1     No current facility-administered medications for this visit.       Health Maintenance:     Health Maintenance   Topic Date Due    COVID-19 Vaccine (3 - Pfizer risk series) 01/24/2021    Urine Albumin/Creatinine Ratio  10/11/2022    Retinal Eye Exam  10/31/2022    Hemoglobin A1c  11/20/2022    Serum Creatinine Monitoring  01/16/2023    Potassium Monitoring  01/16/2023    Foot Exam  03/19/2023    DTaP/Tdap/Td Vaccines (4 - Td or Tdap) 07/17/2023    COPD Spirometry  08/06/2026    Lipid Screening  01/16/2027    Pneumococcal Vaccine 0-64  Completed    Hepatitis C Screen  Completed    Influenza Vaccine  Completed       Immunizations:     Immunization History   Administered Date(s) Administered    COVID-19 VACC,MRNA,(PFIZER)(PF) 12/04/2020, 12/27/2020    HEPATITIS B VACCINE ADULT,IM(ENERGIX B, RECOMBIVAX) 03/05/2013, 04/05/2013, 09/03/2013    Hepatitis B Vaccine, Unspecified Formulation 12/27/1999, 04/29/2000    INFLUENZA INJ MDCK PF, QUAD,(FLUCELVAX)(25MO AND UP EGG FREE) 03/31/2019    INFLUENZA TIV (TRI) PF (IM) 10/08/2011    Influenza Vaccine Quad(IM)6 MO-Adult(PF) 03/05/2013, 03/18/2014, 03/22/2015, 03/05/2016, 03/19/2017, 02/23/2018, 02/19/2022    Influenza Virus Vaccine, unspecified formulation 03/19/2017, 02/23/2018, 03/25/2019, 03/10/2020, 04/05/2021    PNEUMOCOCCAL POLYSACCHARIDE 23-VALENT 12/30/2012    PPD Test 10/28/2016    Pneumococcal Conjugate 20-valent 01/02/2022    TD(TDVAX),ADSORBED,2LF(IM)(PF) 04/29/2000    TdaP 07/18/2008, 07/16/2013     I have reviewed and (if needed) updated the patient's problem list, medications, allergies, past medical and surgical history, social and family history.     Vital Signs:     Wt Readings from Last 3 Encounters:   07/31/22 (!) 210 kg (463 lb)   07/02/22 (!) 209.1 kg (461 lb)   06/18/22 (!) 215.2 kg (474 lb 6.4 oz)     Temp Readings from Last 3 Encounters:   07/31/22 36.4 ??C (97.6 ??F) (Oral)   07/02/22 36.7 ??C (98 ??F) (Oral)   06/18/22 37.1 ??C (98.7 ??F) (Temporal)     BP Readings from Last 3 Encounters:   07/31/22 128/78   07/02/22 132/86   06/18/22 150/78     Pulse Readings from Last 3 Encounters:   07/31/22 60   07/02/22 74   06/18/22 81     Estimated body mass index is 64.6 kg/m?? as calculated from the following:    Height as of this encounter: 180.3 cm (5' 10.98).    Weight as of this encounter: 210 kg (463 lb).  Facility age limit for growth %iles is 20 years.      Objective:  General: Alert and oriented x3. Well-appearing. No acute distress.   HEENT:  Normocephalic.  Atraumatic. Conjunctiva and sclera normal. OP MMM without lesions.   Heart:  Regular rate and rhythm. Normal S1, S2. No murmurs, rubs or gallops.   Lungs:  No respiratory distress.  Lungs clear to auscultation. No wheezes, rhonchi, or rales.   GI/GU:  Soft, obese, +BS, nondistended, non-TTP. No palpable masses or organomegaly.   Extremities:  No edema. Peripheral pulses normal.   Skin:  Warm, dry. No rash or lesions present.   Neuro:  Non-focal. No obvious weakness.   Psych:  Affect normal, eye contact good, speech clear and coherent.         [name], FNP

## 2022-08-01 ENCOUNTER — Ambulatory Visit: Payer: 59 | Admitting: Podiatry

## 2022-08-01 DIAGNOSIS — M109 Gout, unspecified: Principal | ICD-10-CM

## 2022-08-01 MED ORDER — ALLOPURINOL 100 MG TABLET
ORAL_TABLET | Freq: Every day | ORAL | 1 refills | 90 days | Status: CP
Start: 2022-08-01 — End: ?

## 2022-08-02 DIAGNOSIS — Z794 Long term (current) use of insulin: Principal | ICD-10-CM

## 2022-08-02 DIAGNOSIS — E1165 Type 2 diabetes mellitus with hyperglycemia: Principal | ICD-10-CM

## 2022-08-02 MED ORDER — HUMALOG U-100 INSULIN 100 UNIT/ML SUBCUTANEOUS SOLUTION
5 refills | 0 days
Start: 2022-08-02 — End: 2023-08-03

## 2022-08-04 MED ORDER — HUMALOG U-100 INSULIN 100 UNIT/ML SUBCUTANEOUS SOLUTION
5 refills | 0.00000 days | Status: CP
Start: 2022-08-04 — End: 2023-08-03

## 2022-08-05 MED ORDER — LEVOTHYROXINE 50 MCG TABLET
ORAL_TABLET | Freq: Every day | ORAL | 1 refills | 90 days | Status: CP
Start: 2022-08-05 — End: 2023-08-05

## 2022-08-09 NOTE — Unmapped (Signed)
[facility] Refill Coordination Note    Specialty Medication(s) to be Shipped:   Inflammatory Disorders: Humira    Other medication(s) to be shipped: No additional medications requested for fill at this time     Norma Minette, DOB: 03/09/1981  Phone: [phone] (home)       All above HIPAA information was verified with patient.     Was a Nurse, learning disability used for this call? No    Completed refill call assessment today to schedule patient's medication shipment from the [facility] [phone]).  All relevant notes have been reviewed.     Specialty medication(s) and dose(s) confirmed: Regimen is correct and unchanged.   Changes to medications: Trei reports no changes at this time.  Changes to insurance: No  New side effects reported not previously addressed with a pharmacist or physician: None reported  Questions for the pharmacist: No    Confirmed patient received a Conservation officer, historic buildings and a Surveyor, mining with first shipment. The patient will receive a drug information handout for each medication shipped and additional FDA Medication Guides as required.       DISEASE/MEDICATION-SPECIFIC INFORMATION        For patients on injectable medications: Patient currently has 1 doses left.  Next injection is scheduled for 3/7.    SPECIALTY MEDICATION ADHERENCE     Medication Adherence    Patient reported X missed doses in the last month: 0  Specialty Medication: HUmira  Informant: patient              Were doses missed due to medication being on hold? No    Humira 80/0.8 mg/ml: 6 days of medicine on hand       REFERRAL TO PHARMACIST     Referral to the pharmacist: Not needed      [facility]     Shipping address confirmed in Epic.     Patient was notified of new phone menu : Yes    Delivery Scheduled: Yes, Expected medication delivery date: 3/12.     Medication will be delivered via Same Day Courier to the prescription address in Epic WAM.    [name]   [facility] Specialty Technician

## 2022-08-12 ENCOUNTER — Ambulatory Visit (INDEPENDENT_AMBULATORY_CARE_PROVIDER_SITE_OTHER): Payer: 59 | Admitting: Podiatry

## 2022-08-12 ENCOUNTER — Encounter: Payer: Self-pay | Admitting: Podiatry

## 2022-08-12 DIAGNOSIS — B351 Tinea unguium: Secondary | ICD-10-CM

## 2022-08-12 DIAGNOSIS — M79676 Pain in unspecified toe(s): Secondary | ICD-10-CM

## 2022-08-12 MED ORDER — CLOTRIMAZOLE-BETAMETHASONE 1-0.05 % EX CREA: 1.0000 | TOPICAL_CREAM | Freq: Every day | CUTANEOUS | 2 refills | Status: DC

## 2022-08-12 NOTE — Unmapped (Signed)
Assessment and Plan:     Type 2 diabetes mellitus with hyperglycemia, with long-term current use of insulin (CMS-HCC)  HGB A1c 7.5 based on constant glucose monitor today (up from 6.4 05/21/22). Recheck A1c today DM reasonably controlled. Continue Trulicity 4.5 mg weekly, Humalog up to 120 units daily via Omnipod, Lantus 5 units nightly, glipizide 20 mg BID. Encouraged patient to continue carb controlled diet and regular exercise.    - Hemoglobin A1c; Future  - glipiZIDE (GLUCOTROL) 10 MG tablet; Take 2 tablets (20 mg total) by mouth Two (2) times a day (30 minutes before a meal).    Acquired hypothyroidism  Last TSH 2.690 01/15/22. Continue levothyroxine 50 mcg daily Recheck TSH today. Will titrate medication as necessary.     Essential hypertension  BP not at goal (154/84 in clinic today). Continue propranolol 120 mg daily. Trial losartan 25 mg daily. Reviewed low sodium diet and encouraged regular exercise. Advised to continue to monitor and log at-home BP readings. Contact clinic if readings become elevated.    - losartan (COZAAR) 25 MG tablet; Take 1 tablet (25 mg total) by mouth daily.    Mild intermittent asthma without complication  Asthma stable. Continue daily maintenance with Symbicort and rescue inhaler, albuterol, as needed.    - montelukast (SINGULAIR) 10 mg tablet; Take 1 tablet (10 mg total) by mouth daily.    Seasonal allergic rhinitis, unspecified trigger  - montelukast (SINGULAIR) 10 mg tablet; Take 1 tablet (10 mg total) by mouth daily.    Severe obstructive sleep apnea  - fluticasone propionate (FLONASE) 50 mcg/actuation nasal spray; 1 spray into each nostril two (2) times a day.    Topical steroid cream such as hydrocortisone 1% to spider bite twice daily for itching and inflammation  Apply antibiotic ointment such as Bactroban to spider bite twice daily for 7-10 days.         I personally spent 45 minutes face-to-face and non-face-to-face in the care of this patient, which includes all pre, intra, and post visit time on the date of service.    Return in about 3 months (around 11/21/2022) for Next scheduled follow up.    HPI:      Logan Quinn is here for   Chief Complaint   Patient presents with    Diabetes    Hypertension    Hypothyroidism    Asthma    Spider bite     Spider bite LT buttock last night.     Patient was sitting on his bed last night when he was bitten by a spider. He visibly saw the spider and killed it.     Anxiety/Depression: Patient presents for follow-up of depression. PHQ9 score goal  <10.  Depression has customarily been at goal. Symptoms have been stable. Current stressors: none identified today. Current treatment includes: medication: duloxetine, hydroxyzine, and alprazolam.      Diabetes: Patient presents for follow up of diabetes.  A1C goal is <8.  Diabetes has customarily not been at goal (complicated by: Obesity).  Current symptoms include: hyperglycemia and increase appetite. Symptoms have stabilized. Patient denies hypoglycemia  and weight loss. Evaluation to date has included: fasting lipid panel and hemoglobin A1C.  Home sugars: BGs are high in the morning, BGs are high in the evening, BGs are high around dinner, BGs are high at bedtime. Current treatment: Continued glipizide, insulin glargine, insulin lispro, and dulaglutide  which has been somewhat effective.  Doing regular exercise: no.  Hypertension: Patient presents for follow-up of hypertension. Blood pressure goal < 140/90.  Hypertension has customarily been at goal complicated by DM, obesity.  Home blood pressure readings: did not bring log. Medication compliance: taking propranolol 120 mg daily as prescribed. He is not doing regular exercise.     Hypothyroid: Patient presents for follow-up of hypothyroidism. Current symptoms: weight changes. Symptoms have stabilized and been well-controlled. He is taking medications on a regular basis. Current therapy includes: levothyroxine 50 mcg daily.    Logan Quinn has Diabetes type 2, uncontrolled; Lesion of radial nerve; GERD (gastroesophageal reflux disease) (RAF-HCC); Depression (RAF-HCC); Suicidal ideation; Abdominal pain; Headache; NAFLD (nonalcoholic fatty liver disease); Bleeding external hemorrhoids; Anxiety; Diarrhea; Obesity; Type 2 diabetes mellitus with hyperglycemia (CMS-HCC); Left shoulder pain; Trapezius muscle strain; Primary insomnia; Chronic joint pain; Cellulitis of right lower extremity; No diabetic retinopathy in both eyes; Congenital hypertrophy of retinal pigment epithelium; H/O retinal detachment; IBS (irritable bowel syndrome); Urinary incontinence, nocturnal enuresis; Severe obstructive sleep apnea; OSA on CPAP; Hidradenitis suppurativa; Acute non-recurrent pansinusitis; Acquired hypothyroidism; Essential hypertension; Mild intermittent asthma without complication; Chronic pansinusitis; Environmental and seasonal allergies; Erectile dysfunction; Sinus congestion; Nasal congestion; and Cough variant asthma on their problem list.      He has a current medication list which includes the following prescription(s): acetaminophen, humira(cf) pen, albuterol, albuterol, alprazolam, ascorbic acid, accu-chek guide test strips, budesonide-formoterol, chlorhexidine, clindamycin, clindamycin, clobetasol, clobetasol, colchicine, cyclobenzaprine, dextroamphetamine sulfate, dicyclomine, diphenoxylate-atropine, dulaglutide, duloxetine, empty container, aimovig autoinjector, famotidine, fluoride (sodium), glipizide, humalog u-100 insulin, hydrochlorothiazide, hydrocortisone, hydroxyzine, ibuprofen, insulin glargine, omnipod 5 g6 pods (gen 5), insulin syringe-needle u-100, ketoconazole, lancets, levothyroxine, lidocaine, multivitamin, nystatin, ondansetron, oxybutynin, pantoprazole, polyethylene glycol, promethazine, propranolol, ramelteon, bd safetyglide needle, safety needles, tart cherry extract, tadalafil, testosterone cypionate, topiramate, wellbutrin xl, blood-glucose meter, diclofenac, fluticasone propionate, and montelukast.       ROS:      Comprehensive 10 point ROS negative unless otherwise stated in the HPI.      [facility] Components:     Medication adherence and barriers to the treatment plan have been addressed. Opportunities to optimize healthy behaviors have been discussed. Patient / caregiver voiced understanding.    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)     Obstructive sleep apnea     OSA on CPAP     Retinal detachment 04/2014    Severe obstructive sleep apnea     Trapezius muscle strain 12/07/2013    Urinary incontinence, nocturnal enuresis     Venous insufficiency      Past Surgical History:   Procedure Laterality Date    EYE SURGERY  04/2014    LASER ABLATION INCOMPETENT VEIN INI [facility] HISTORICAL RESULT) Right     PR COLONOSCOPY FLX DX W/COLLJ SPEC WHEN PFRMD N/A 03/24/2013  Procedure: COLONOSCOPY, FLEXIBLE, PROXIMAL TO SPLENIC FLEXURE; DIAGNOSTIC, W/WO COLLECTION SPECIMEN BY BRUSH OR WASH;  Surgeon: Clint Bolder, MD;  Location: [facility];  Service: Gastroenterology    PR EYE SURG POST SGMT PROC UNLISTED Left     pneumatic retinopexy OS    PR UPPER GI ENDOSCOPY,DIAGNOSIS N/A 02/02/2013    Procedure: UGI ENDO, INCLUDE ESOPHAGUS, STOMACH, & DUODENUM &/OR JEJUNUM; DX W/WO COLLECTION SPECIMN, BY BRUSH OR WASH;  Surgeon: Malcolm Metro, MD;  Location: [facility];  Service: Gastroenterology    [place] PYLORIC STENOSIS ([place] HISTORICAL RESULT)         Family History:     Family History   Problem Relation Age of Onset    Cancer Maternal Grandfather         Stomach Cancer    Hearing loss Maternal Grandfather     Cancer Paternal Grandfather         Bone Cancer, Lung Cancer    COPD Paternal Grandmother     Arthritis Paternal Grandmother     Depression Paternal Grandmother     Diabetes Mother     Heart disease Mother     Migraines Mother     Arthritis Mother     Depression Mother     GER disease Mother     Hypertension Mother     Angina Mother     COPD Mother     Glaucoma Mother     Hearing loss Mother     Cataracts Mother     Diabetes Sister     Migraines Sister     Asthma Sister     Depression Sister     Hearing loss Sister     Diabetes Brother     Asthma Brother     Diabetes Maternal Grandmother     Heart disease Maternal Grandmother     Migraines Maternal Grandmother     Depression Maternal Grandmother     Angina Maternal Grandmother     Hypertension Maternal Grandmother     Stroke Maternal Grandmother     Diabetes Maternal Uncle     Hearing loss Maternal Uncle     Diabetes Maternal Uncle         resulted in need for kidney transplant    Liver disease Maternal Uncle     Kidney disease Maternal Uncle         needed kidney transplant    Asthma Brother     No Known Problems Father     No Known Problems Paternal Aunt     No Known Problems Paternal Uncle     No Known Problems Other     Colorectal Cancer Neg Hx     Esophageal cancer Neg Hx     Liver cancer Neg Hx     Pancreatic cancer Neg Hx     Stomach cancer Neg Hx     Amblyopia Neg Hx     Blindness Neg Hx     Retinal detachment Neg Hx     Strabismus Neg Hx     Macular degeneration Neg Hx     Anesthesia problems Neg Hx     Broken bones Neg Hx     Clotting disorder Neg Hx     Collagen disease Neg Hx     Dislocations Neg Hx     Fibromyalgia Neg Hx     Gout Neg Hx     Hemophilia Neg Hx     Osteoporosis Neg Hx  Rheumatologic disease Neg Hx     Scoliosis Neg Hx     Severe sprains Neg Hx     Sickle cell anemia Neg Hx     Spinal Compression Fracture Neg Hx     Melanoma Neg Hx     Basal cell carcinoma Neg Hx     Squamous cell carcinoma Neg Hx     Deep vein thrombosis Neg Hx     Thyroid disease Neg Hx        Social History:     Social History     Tobacco Use    Smoking status: Never     Passive exposure: Never    Smokeless tobacco: Never   Vaping Use    Vaping status: Never Used   Substance Use Topics    Alcohol use: Never     Comment: rare social    Drug use: Never       Allergies:     Erythromycin, Penicillins, Sulfa (sulfonamide antibiotics), and Other    Current Medications:     Current Outpatient Medications   Medication Sig Dispense Refill    adalimumab (HUMIRA,CF, PEN) 80 mg/0.8 mL PnKt Inject 80 mg subcutaneously once every week 4 each 11    allopurinol (ZYLOPRIM) 100 MG tablet Take 2 tablets (200 mg total) by mouth daily. 180 tablet 1    ALPRAZolam (XANAX) 0.5 MG tablet Takes 1 tablet nightly and 1 tablet a day when needed      anastrozole (ARIMIDEX) 1 mg tablet Take 1 tablet (1 mg total) by mouth every other day. 45 tablet 3    ascorbic acid (VITAMIN C ORAL) Take 1 capsule by mouth nightly.      BD LUER-LOK SYRINGE 3 mL 21 gauge x 1 1/2 Syrg weekly      blood sugar diagnostic (ACCU-CHEK GUIDE TEST STRIPS) Strp by Other route Three (3) times a day before meals. 300 each 1    blood-glucose meter kit Use as directed 1 each 0    budesonide-formoteroL (SYMBICORT) 80-4.5 mcg/actuation inhaler Inhale 2 puffs Two (2) times a day. 10.2 g 11    chlorhexidine (PERIDEX) 0.12 % solution 15 mL by Oromucosal route two (2) times a day. 900 mL 3    clindamycin (CLEOCIN T) 1 % lotion Apply topically two (2) times a day. To affected areas on body as needed for flares 60 mL 3    clobetasoL (TEMOVATE) 0.05 % Gel       clobetasoL (TEMOVATE) 0.05 % ointment Apply daily to painful affected areas if needed for flares, then stop 15 g 1    colchicine (MITIGARE) 0.6 mg cap capsule Take 1 capsule (0.6 mg total) by mouth daily. 90 capsule 0    cyclobenzaprine (FLEXERIL) 10 MG tablet Take 1 tablet (10 mg total) by mouth Three (3) times a day as needed for muscle spasms. 60 tablet 1    dextroamphetamine sulfate (DEXTROSTAT) 10 MG tablet Take 2 tablets (20 mg total) by mouth two (2) times a day.      dicyclomine (BENTYL) 10 mg capsule Take 1 capsule (10 mg total) by mouth Four (4) times a day (before meals and nightly). 120 capsule 3    diphenoxylate-atropine (LOMOTIL) 2.5-0.025 mg per tablet Take 1 tablet by mouth two (2) times a day as needed for diarrhea. 30 tablet 5    dulaglutide (TRULICITY) 4.5 mg/0.5 mL PnIj Inject 4.5 mg under the skin every seven (7) days. 2 mL 2    DULoxetine (CYMBALTA) 60  MG capsule Take 1 capsule (60 mg total) by mouth Two (2) times a day. 180 capsule 0    empty container Misc Use as directed to dispose of needles. When full, make sure lid is closed tightly then dispose of container in trash. 1 each 2    erenumab-aooe (AIMOVIG AUTOINJECTOR) 140 mg/mL AtIn Inject 1 Pen under the skin every thirty (30) days. 3 mL 1    famotidine (PEPCID) 20 MG tablet Take 1 tablet (20 mg total) by mouth two (2) times a day. 180 tablet 1    fluoride, sodium, 1.1 % Pste       HUMALOG U-100 INSULIN 100 unit/mL injection INJECT 120 UNITS UNDER THE SKIN DAILY VIA OMNIPOD 40 mL 5    hydrocortisone (PROCTOSOL HC) 2.5 % rectal cream Insert 1 application into the rectum two (2) times a day as needed. 28.35 g 2    hydrOXYzine (ATARAX) 25 MG tablet Take 1 tablet (25 mg total) by mouth Three (3) times a day as needed. May take two before bed for sleep. 120 tablet 1    ibuprofen (ADVIL,MOTRIN) 800 MG tablet Take 1 tablet (800 mg total) by mouth every eight (8) hours as needed. 90 tablet 1    insulin glargine (LANTUS U-100 INSULIN) 100 unit/mL injection Inject 0.05 mL (5 Units total) under the skin nightly. 10 mL 1    insulin pump cart,automated,BT (OMNIPOD 5 G6 PODS, GEN 5,) Crtg Change POD every 48 hours. 3 each 11    insulin syringe-needle U-100 1 mL 31 gauge x 5/16 (8 mm) Syrg Once daily for insulin dosing. 100 each 3    ketoconazole (NIZORAL) 2 % cream Apply 1 Application topically daily. To affected area on body until clear 60 g 3    lancets (ACCU-CHEK FASTCLIX LANCET DRUM) Misc Use to check blood sugars 3 times a day before meals or as directed 200 each 5    levothyroxine (SYNTHROID) 50 MCG tablet Take 1 tablet (50 mcg total) by mouth daily. 90 tablet 1    lidocaine (XYLOCAINE) 5 % ointment Apply topically Three (3) times a day as needed. 30 g 1    multivitamin (MULTIPLE VITAMIN ORAL) Take 1 tablet by mouth daily.      ondansetron (ZOFRAN) 4 MG tablet Take 1 tablet (4 mg total) by mouth every eight (8) hours as needed for nausea. 30 tablet 1    oxybutynin (DITROPAN) 5 MG tablet Take 1 tablet (5 mg total) by mouth Two (2) times a day. 180 tablet 1    pantoprazole (PROTONIX) 40 MG tablet Take 1 tablet (40 mg total) by mouth two (2) times a day. 180 tablet 1    polyethylene glycol (GLYCOLAX) 17 gram/dose powder       promethazine (PHENERGAN) 25 MG tablet Take 1 tablet (25 mg total) by mouth every eight (8) hours as needed for nausea. 30 tablet 1    propranoloL (INDERAL LA) 120 mg 24 hr capsule Take 1 capsule (120 mg total) by mouth daily. 90 capsule 1    ramelteon (ROZEREM) 8 mg tablet Take 1 tablet (8 mg total) by mouth nightly.      safety needles (BD SAFETYGLIDE NEEDLE) 23 gauge x 1 Ndle 1 Units by Miscellaneous route once a week. 12 each 11    safety needles 18 gauge x 1 1/2 Ndle 1 Units by Miscellaneous route once a week. 12 each 11    sour cherry extract (TART CHERRY EXTRACT) 1,000 mg cap  tadalafiL (CIALIS) 5 MG tablet Take 1 tablet (5 mg total) by mouth in the morning. 90 tablet 3    testosterone cypionate (DEPOTESTOTERONE CYPIONATE) 200 mg/mL injection Inject 0.5 mL (100 mg total) into the muscle once a week. Inject 0.5cc (100mg ) weekly 2 mL 5    topiramate (TOPAMAX) 50 MG tablet Take 1.5 tablets (75 mg total) by mouth two (2) times a day. 270 tablet 1    WELLBUTRIN XL 150 mg 24 hr tablet Take 1 tablet (150 mg total) by mouth every morning.      albuterol 2.5 mg /3 mL (0.083 %) nebulizer solution Inhale 3 mL (2.5 mg total) by nebulization every four (4) hours as needed for wheezing. (Patient not taking: Reported on 08/21/2022) 180 mL 2    albuterol HFA 90 mcg/actuation inhaler Inhale 2 puffs every six (6) hours as needed for wheezing. (Patient not taking: Reported on 08/21/2022) 8.5 g 5    fluticasone propionate (FLONASE) 50 mcg/actuation nasal spray 1 spray into each nostril two (2) times a day. 16 g 5    glipiZIDE (GLUCOTROL) 10 MG tablet Take 2 tablets (20 mg total) by mouth Two (2) times a day (30 minutes before a meal). 360 tablet 3    losartan (COZAAR) 25 MG tablet Take 1 tablet (25 mg total) by mouth daily. 90 tablet 3    montelukast (SINGULAIR) 10 mg tablet Take 1 tablet (10 mg total) by mouth daily. 90 tablet 3    mupirocin (BACTROBAN) 2 % ointment Apply topically two (2) times a day for 7 days. 30 g 0     No current facility-administered medications for this visit.       Health Maintenance:     Health Maintenance   Topic Date Due    COVID-19 Vaccine (3 - Pfizer risk series) 01/24/2021    Urine Albumin/Creatinine Ratio  10/11/2022    Retinal Eye Exam  10/31/2022    Hemoglobin A1c  11/20/2022    Serum Creatinine Monitoring  01/16/2023    Potassium Monitoring  01/16/2023    Foot Exam  03/19/2023    DTaP/Tdap/Td Vaccines (4 - Td or Tdap) 07/17/2023    COPD Spirometry  08/06/2026    Lipid Screening  01/16/2027    Pneumococcal Vaccine 0-64  Completed    Hepatitis C Screen  Completed    Influenza Vaccine  Completed       Immunizations:     Immunization History   Administered Date(s) Administered    COVID-19 VACC,MRNA,(PFIZER)(PF) 12/04/2020, 12/27/2020    HEPATITIS B VACCINE ADULT,IM(ENERGIX B, RECOMBIVAX) 03/05/2013, 04/05/2013, 09/03/2013    Hepatitis B Vaccine, Unspecified Formulation 12/27/1999, 04/29/2000    INFLUENZA INJ MDCK PF, QUAD,(FLUCELVAX)(5MO AND UP EGG FREE) 03/31/2019    INFLUENZA TIV (TRI) PF (IM) 10/08/2011    Influenza Vaccine Quad(IM)6 MO-Adult(PF) 03/05/2013, 03/18/2014, 03/22/2015, 03/05/2016, 03/19/2017, 02/23/2018, 02/19/2022    Influenza Virus Vaccine, unspecified formulation 03/19/2017, 02/23/2018, 03/25/2019, 03/10/2020, 04/05/2021    PNEUMOCOCCAL POLYSACCHARIDE 23-VALENT 12/30/2012    PPD Test 10/28/2016    Pneumococcal Conjugate 20-valent 01/02/2022    TD(TDVAX),ADSORBED,2LF(IM)(PF) 04/29/2000    TdaP 07/18/2008, 07/16/2013     I have reviewed and (if needed) updated the patient's problem list, medications, allergies, past medical and surgical history, social and family history.     Vital Signs:     Wt Readings from Last 3 Encounters:   08/21/22 (!) 214.6 kg (473 lb)   07/31/22 (!) 210 kg (463 lb)   07/02/22 (!) 209.1 kg (461 lb)  Temp Readings from Last 3 Encounters:   08/21/22 37.2 ??C (98.9 ??F) (Oral)   07/31/22 36.4 ??C (97.6 ??F) (Oral)   07/02/22 36.7 ??C (98 ??F) (Oral)     BP Readings from Last 3 Encounters:   08/21/22 154/84   07/31/22 128/78   07/02/22 132/86     Pulse Readings from Last 3 Encounters:   08/21/22 93   07/31/22 60   07/02/22 74     Estimated body mass index is 66 kg/m?? as calculated from the following:    Height as of this encounter: 180.3 cm (5' 10.98).    Weight as of this encounter: 214.6 kg (473 lb).  Facility age limit for growth %iles is 20 years.      Objective:      General: Alert and oriented x3. Well-appearing. No acute distress. Morbidly obese.  HEENT: Normocephalic.  Atraumatic. Conjunctiva and sclera normal.   Neck:  Supple.   Heart:  Regular rate and rhythm. Normal S1, S2. No murmurs, rubs or gallops.   Lungs: Lungs clear to auscultation. No wheezes, rhonchi, or rales.   GI/GU: Obese, large pannus.  Extremities: Bilateral lymphedema.  Skin:  Warm, dry. No rash or lesions present. 1 cm mildly erythematous papule with double central punctate to left buttocks. Surrounding excoriation.   Neuro:  Non-focal. No obvious weakness.   Psych:  Affect normal, eye contact good, speech clear and coherent.        I attest that I, Arta Bruce, personally documented this note while acting as scribe for [name], FNP.      Arta Bruce, Scribe.  08/21/2022     The documentation recorded by the scribe accurately reflects the service I personally performed and the decisions made by me.     [name], FNP

## 2022-08-13 NOTE — Progress Notes (Unsigned)
MRN : [id number]  Jason Davidson is a 42 y.o. ([birth year]) male who presents with chief complaint of legs hurt and swell.  History of Present Illness:   The patient returns to the office for followup evaluation regarding leg swelling.  The swelling has improved quite a bit and the pain associated with swelling has decreased substantially. There have not been any interval development of a ulcerations or wounds.  Since the previous visit the patient has been wearing graduated compression stockings and has noted some improvement in the lymphedema. The patient has been using compression routinely morning until night.  The patient also states elevation during the day and exercise (such as walking) is being done too.    No outpatient medications have been marked as taking for the 08/15/22 encounter (Appointment) with Delana Meyer, Dolores Lory, MD.    Past Medical History:  Diagnosis Date   Asthma    Depressed    Diabetes mellitus without complication ([place])    Gout    IBS (irritable bowel syndrome)    Morbid obesity ([place])    Sleep apnea    Thyroid disease     Past Surgical History:  Procedure Laterality Date   COLONOSCOPY     ESOPHAGOGASTRODUODENOSCOPY ENDOSCOPY     EYE SURGERY     LASER ABLATION INCOMPETENT VEIN INI      Social History Social History   Tobacco Use   Smoking status: Never   Smokeless tobacco: Never  Vaping Use   Vaping Use: Never used  Substance Use Topics   Alcohol use: No   Drug use: No    Family History Family History  Problem Relation Age of Onset   Diabetes Mother    Depression Mother    Diabetes Sister    Diabetes Brother    Diabetes Maternal Uncle    Diabetes Maternal Grandmother    Heart disease Maternal Grandmother    Cancer Maternal Grandfather    COPD Paternal Grandmother    Cancer Paternal Grandfather    Varicose Veins Paternal Grandfather    Healthy Father     Allergies  Allergen Reactions   Erythromycin Nausea And  Vomiting   Penicillins Swelling   Sulfa Antibiotics Nausea And Vomiting     REVIEW OF SYSTEMS (Negative unless checked)  Constitutional: '[]'$ Weight loss  '[]'$ Fever  '[]'$ Chills Cardiac: '[]'$ Chest pain   '[]'$ Chest pressure   '[]'$ Palpitations   '[]'$ Shortness of breath when laying flat   '[]'$ Shortness of breath with exertion. Vascular:  '[]'$ Pain in legs with walking   '[x]'$ Pain in legs at rest  '[]'$ History of DVT   '[]'$ Phlebitis   '[x]'$ Swelling in legs   '[]'$ Varicose veins   '[]'$ Non-healing ulcers Pulmonary:   '[]'$ Uses home oxygen   '[]'$ Productive cough   '[]'$ Hemoptysis   '[]'$ Wheeze  '[]'$ COPD   '[]'$ Asthma Neurologic:  '[]'$ Dizziness   '[]'$ Seizures   '[]'$ History of stroke   '[]'$ History of TIA  '[]'$ Aphasia   '[]'$ Vissual changes   '[]'$ Weakness or numbness in arm   '[]'$ Weakness or numbness in leg Musculoskeletal:   '[]'$ Joint swelling   '[x]'$ Joint pain   '[]'$ Low back pain Hematologic:  '[]'$ Easy bruising  '[]'$ Easy bleeding   '[]'$ Hypercoagulable state   '[]'$ Anemic Gastrointestinal:  '[]'$ Diarrhea   '[]'$ Vomiting  '[x]'$ Gastroesophageal reflux/heartburn   '[]'$ Difficulty swallowing. Genitourinary:  '[]'$ Chronic kidney disease   '[]'$ Difficult urination  '[]'$ Frequent urination   '[]'$ Blood in urine Skin:  '[]'$ Rashes   '[]'$ Ulcers  Psychological:  '[]'$ History of anxiety   '[]'$  History of major  depression.  Physical Examination  There were no vitals filed for this visit. There is no height or weight on file to calculate BMI. Gen: WD/WN, NAD Head: [place]/AT, No temporalis wasting.  Ear/Nose/Throat: Hearing grossly intact, nares w/o erythema or drainage, pinna without lesions Eyes: PER, EOMI, sclera nonicteric.  Neck: Supple, no gross masses.  No JVD.  Pulmonary:  Good air movement, no audible wheezing, no use of accessory muscles.  Cardiac: RRR, precordium not hyperdynamic. Vascular:  scattered varicosities present bilaterally.  Moderate venous stasis changes to the legs bilaterally.  2+ soft pitting edema. CEAP [id number]   Vessel Right Left  Radial Palpable Palpable  Gastrointestinal: soft,  non-distended. No guarding/no peritoneal signs.  Musculoskeletal: M/S 5/5 throughout.  No deformity.  Neurologic: CN 2-12 intact. Pain and light touch intact in extremities.  Symmetrical.  Speech is fluent. Motor exam as listed above. Psychiatric: Judgment intact, Mood & affect appropriate for pt's clinical situation. Dermatologic: Venous rashes no ulcers noted.  No changes consistent with cellulitis. Lymph : No lichenification or skin changes of chronic lymphedema.  CBC Lab Results  Component Value Date   WBC 8.0 02/14/2014   HGB 14.8 02/14/2014   HCT 43.4 02/14/2014   MCV 94 02/14/2014   PLT 175 02/14/2014    BMET    Component Value Date/Time   NA 137 02/14/2014 0437   K 3.8 02/14/2014 0437   CL 101 02/14/2014 0437   CO2 26 02/14/2014 0437   GLUCOSE 307 (H) 02/14/2014 0437   BUN 12 02/14/2014 0437   CREATININE 1.01 02/14/2014 0437   CALCIUM 8.4 (L) 02/14/2014 0437   GFRNONAA >60 02/14/2014 0437   GFRAA >60 02/14/2014 0437   CrCl cannot be calculated (Patient's most recent lab result is older than the maximum 21 days allowed.).  COAG No results found for: "INR", "PROTIME"  Radiology No results found.   Assessment/Plan There are no diagnoses linked to this encounter.   Hortencia Pilar, MD  08/13/2022 4:03 PM

## 2022-08-13 NOTE — Progress Notes (Signed)
  Subjective:  Patient ID: Jason Davidson, male    DOB: March 12, 1981,  MRN: [id number]  Chief Complaint  Patient presents with   Nail Problem    "It's the routine foot care."    42 y.o. male presents with the above complaint. History confirmed with patient.  His nails are thickened elongated causing pain and discomfort  Objective:  Physical Exam: warm, good capillary refill, no trophic changes or ulcerative lesions, normal DP and PT pulses, and normal sensory exam. Left Foot: dystrophic yellowed discolored nail plates with subungual debris Right Foot: dystrophic yellowed discolored nail plates with subungual debris   Assessment:   1. Pain due to onychomycosis of toenail      Plan:  Patient was evaluated and treated and all questions answered.   Discussed the etiology and treatment options for the condition in detail with the patient. Educated patient on the topical and oral treatment options for mycotic nails. Recommended debridement of the nails today. Sharp and mechanical debridement performed of all painful and mycotic nails today. Nails debrided in length and thickness using a nail nipper to level of comfort. Discussed treatment options including appropriate shoe gear. Follow up as needed for painful nails.    Return in about 3 months (around 11/12/2022) for at risk diabetic foot care.

## 2022-08-15 ENCOUNTER — Encounter (INDEPENDENT_AMBULATORY_CARE_PROVIDER_SITE_OTHER): Payer: Self-pay | Admitting: Vascular Surgery

## 2022-08-15 ENCOUNTER — Ambulatory Visit (INDEPENDENT_AMBULATORY_CARE_PROVIDER_SITE_OTHER): Payer: 59 | Admitting: Vascular Surgery

## 2022-08-15 VITALS — BP 168/92 | HR 91 | Resp 18 | Ht 71.0 in | Wt >= 6400 oz

## 2022-08-15 DIAGNOSIS — I890 Lymphedema, not elsewhere classified: Secondary | ICD-10-CM

## 2022-08-15 DIAGNOSIS — K219 Gastro-esophageal reflux disease without esophagitis: Secondary | ICD-10-CM | POA: Diagnosis not present

## 2022-08-15 DIAGNOSIS — Z794 Long term (current) use of insulin: Secondary | ICD-10-CM

## 2022-08-15 DIAGNOSIS — E1142 Type 2 diabetes mellitus with diabetic polyneuropathy: Secondary | ICD-10-CM | POA: Diagnosis not present

## 2022-08-15 DIAGNOSIS — I872 Venous insufficiency (chronic) (peripheral): Secondary | ICD-10-CM | POA: Diagnosis not present

## 2022-08-15 DIAGNOSIS — I83813 Varicose veins of bilateral lower extremities with pain: Secondary | ICD-10-CM

## 2022-08-19 ENCOUNTER — Other Ambulatory Visit: Admit: 2022-08-19 | Discharge: 2022-08-20 | Payer: MEDICARE

## 2022-08-19 LAB — TESTOSTERONE: TESTOSTERONE TOTAL: 619 ng/dL

## 2022-08-19 LAB — ESTRADIOL(ESTROGEN) LEVEL: ESTRADIOL LEVEL: 52.3 pg/mL

## 2022-08-19 LAB — HEMATOCRIT: HEMATOCRIT: 44.5 % (ref 39.0–48.0)

## 2022-08-19 NOTE — Unmapped (Unsigned)
[facility]  Pulmonary Clinic - Follow Up Visit    Referring Physician :  Loran Senters  PCP:     Loran Senters, FNP  Reason for Consult:   ***    ASSESSMENT and PLAN     Problem List  #Mild Persistent asthma ***   #Obesity (BMI ***)  #Severe OSA (AHI***)  #GERD  #Hidradenitis Suppurative on Humira  #NAFLD  #Depression w prior SI    History of Present Illness: [name] is a 42 y.o. male  who is seen in consultation at the request of Loran Senters, * for comprehensive evaluation of asthma    # Mild Persistent Asthma ***   # Bronchitis episodes   Main symptom is cough. Bronchitis episodes with variable responsiveness to albuterol and prednisone. Eos as high as 300 but 100 lately. PFT's suggest reversibility with no fixed obstruction.   - RTC in     # OSA   - continue CPAP ***    Plan of care was discussed with the patient who acknowledged understanding and is in agreement.    Patient will No follow-ups on file. or sooner if needed.    Mr. Ovila Wanger was seen, examined and discussed with {Pulmonary Attendings:81951} who agrees with the assessment and plan above.     ZO:XWRU Rosita Fire, Loran Senters, FNP    [facility] Griffith Citron, DO  Pulmonary and Critical Care Fellow    HISTORY:      History of Present Illness: [name] is a 42 y.o. male who is seen for chief complaint of asthma.    He was initially seen by Dr. Windell Moment (initial visit 08/06/21) for bronchitis episodes that occur 4-6x/year.     Has been taking Advair discus and albuterol and wants to know what more can be done.     First time he had bronchitis was around age 54. It seems to become more prevalent since then. At first there was a seaonality, like when the seasons change. Winter times are still worse, but it seems more indiscriminanty these days. No odor/perfume sensitivity. Cooler temperatures make it worse. A typical course is a hacking coughing, a little phlegm in the beginning, then just irritating non-productive cough, chest tightness, phlegm is often sticky/thick and green. Usually it just has to run its course, usually drinks hot liquids and uses Vicks vaporub. Albuterol gives temporary relief. In the midst of an attack he doesn't feel the medicine is getting in (Advair). Prednisone and abx don't consistently help. Last episode was Dec-Jan.      No nighttime cough. CPAP machine broken so not using. Will see sleep clinic soon amd is working with [facility] to get a loaner     Prior to 30's no other lung dz. No tobacco or vape or MJ. No inh exposures. Currently disabled and cares for his kids. Pets at home: 3 dogs. Has had dogs since 2008 and most of his life. No birds.      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 They are 2 floors apart, and they have been in the building for 3 years only on Tuesday nights.      Born and raised in [place]. Dad's mom had COPD. Mom has COPD. Both smoke.      Has GERD and has seen GI and ENT who put him on 2x PPI and H2.      Has  hidradenitis suppurativa treated with Humira. Started ~ 2020     Interval 11/21/21:  - Using Symbicort well, BID  - Last 2 weeks had to use albuterol a few times while camping and making campfires. Otherwise rarely except when sick    Interval 08/19/22:  ***    Asthma:  Seasonal allergies: ***  Eczema: ***  Childhood asthma: ***  Aspirin/NSAID sensitivity: ***  Triggers: ***    Exacerbations: ***  Nocturnal Symptoms: ***  Trigger avoidance: ***  Peak flows: ***  Rescue Inhaler use: ***  Controller inhaler adherance: ***    COPD  Exacerbations: ***  Rescue inhaler use: ***  Controller inhaler adherence: ***  Tobacco use: ***  mMRC:     0 I only get breathless with strenuous exercise  1 I get short of breath when hurrying on level ground or walking up a slight hill  2 On level ground, I walk slower than people of the same age because of breathlessness, or have to stop for a breath when walking at my own pace  3 I stop for a breath after walking about 100 yards or after a few minutes on level ground  4 I am too breathless to leave the house or I am breathless when dressing    Exposure/Occupational:   *** Admits/Denies Tobacco use: *** ppd/years  *** Admits/Denies Vaping:   *** Admits/Denies Illicit drug use:   - Admits to: ***  - Denies: exposure to swamp coolers, hot tubs/saunas, asbestos Conservation officer, historic buildings, shipyards), machinery, Clinical research associate, ceramics, dust, mold firefighting, Office manager, metal vapors, Arts administrator  - *** Pets/Animals (Cat, Dog, Birds)    Family history:  - No family history of lung diseases, lung cancers, autoimmune conditions ***     Social History:  - Occupation: ***  - Lives with: ***    OSH records and referral documentation reviewed.     Review of Systems:  A comprehensive review of systems was completed and negative except as noted in HPI.    Relevant Medications: Reviewed    Allergies: Reviewed.    Other History:  The past medical history, surgical history, social history, family history, medications and allergies were personally reviewed and updated in the patient's electronic medical record. Pertinent items are noted above.        PHYSICAL EXAM:      Physical Exam:There were no vitals filed for this visit.  There is no height or weight on file to calculate BMI.    General: WDWN/Obese/Cachectic male in no apparent distress  Eyes: PERRL. Anicteric sclera.   ENT:  Nasal mucosa clear. No polyps seen. No crusting.  Lymph: No cervical or supraclavicular lymphadenopathy.  Respiratory: CTAB***, no wheezing, rhonchi or rales  Cardiovascular: RRR, +s1/s2, Normal P2, no RV heave, No MRG, No edema  Abdomen: BS present, Soft, Non-tender, non-distended.  Musculoskeletal/extremities: Normal muscle tone and strength. No cyanosis. No clubbing. ***No BLE edema  Skin: No skin rashes on clothed exam  Neuro: Alert and oriented x 3. CN grossly intact. No focal neurological deficits         LABORATORY and RADIOLOGY DATA:     Pulmonary Function Test Results:    Date FEV1  (% Pred) FVC   (% Pred) FEV1/FVC DLCO  (% Pred) TLC Desat  Distance   05/25/20 3.22 (75.3) 3.94 (74.3) 82       08/06/21 3.37 (79.4) 4.24 (80.3) 80         : none    Pertinent Laboratory  Data:  Reviewed    Pertinent Imaging Data:  Images were personally reviewed with attending.       [facility] Griffith Citron, DO  Pulmonary and Critical Care Fellow

## 2022-08-20 MED FILL — HUMIRA(CF) PEN 80 MG/0.8 ML SUBCUTANEOUS KIT: 28 days supply | Qty: 4 | Fill #1

## 2022-08-20 MED FILL — EMPTY CONTAINER: 120 days supply | Qty: 1 | Fill #2

## 2022-08-21 ENCOUNTER — Encounter (INDEPENDENT_AMBULATORY_CARE_PROVIDER_SITE_OTHER): Payer: Self-pay | Admitting: Vascular Surgery

## 2022-08-21 ENCOUNTER — Ambulatory Visit: Admit: 2022-08-21 | Discharge: 2022-08-22 | Payer: MEDICARE | Attending: Family | Primary: Family

## 2022-08-21 DIAGNOSIS — T63301A Toxic effect of unspecified spider venom, accidental (unintentional), initial encounter: Principal | ICD-10-CM

## 2022-08-21 DIAGNOSIS — J4520 Mild intermittent asthma, uncomplicated: Principal | ICD-10-CM

## 2022-08-21 DIAGNOSIS — G4733 Obstructive sleep apnea (adult) (pediatric): Principal | ICD-10-CM

## 2022-08-21 DIAGNOSIS — J302 Other seasonal allergic rhinitis: Principal | ICD-10-CM

## 2022-08-21 DIAGNOSIS — E1165 Type 2 diabetes mellitus with hyperglycemia: Principal | ICD-10-CM

## 2022-08-21 DIAGNOSIS — I10 Essential (primary) hypertension: Principal | ICD-10-CM

## 2022-08-21 DIAGNOSIS — E039 Hypothyroidism, unspecified: Principal | ICD-10-CM

## 2022-08-21 DIAGNOSIS — Z794 Long term (current) use of insulin: Principal | ICD-10-CM

## 2022-08-21 LAB — HEMOGLOBIN A1C
ESTIMATED AVERAGE GLUCOSE: 154 mg/dL
HEMOGLOBIN A1C: 7 % — ABNORMAL HIGH (ref 4.8–5.6)

## 2022-08-21 MED ORDER — GLIPIZIDE 10 MG TABLET
ORAL_TABLET | Freq: Two times a day (BID) | ORAL | 3 refills | 90 days | Status: CP
Start: 2022-08-21 — End: 2023-08-21

## 2022-08-21 MED ORDER — MUPIROCIN 2 % TOPICAL OINTMENT
Freq: Two times a day (BID) | TOPICAL | 0 refills | 7 days | Status: CP
Start: 2022-08-21 — End: 2022-08-28

## 2022-08-21 MED ORDER — LOSARTAN 25 MG TABLET
ORAL_TABLET | Freq: Every day | ORAL | 3 refills | 90 days | Status: CP
Start: 2022-08-21 — End: 2023-08-21

## 2022-08-21 MED ORDER — FLUTICASONE PROPIONATE 50 MCG/ACTUATION NASAL SPRAY,SUSPENSION
Freq: Two times a day (BID) | NASAL | 5 refills | 60 days | Status: CP
Start: 2022-08-21 — End: 2023-08-21

## 2022-08-21 MED ORDER — MONTELUKAST 10 MG TABLET
ORAL_TABLET | Freq: Every day | ORAL | 3 refills | 90 days | Status: CP
Start: 2022-08-21 — End: 2023-08-22

## 2022-08-21 NOTE — Unmapped (Addendum)
Topical steroid cream such as hydrocortisone 1% to spider bite twice daily for itching and inflammation  Apply antibiotic ointment such as Bactroban to spider bite twice daily for 7-10 days.

## 2022-08-21 NOTE — Unmapped (Signed)
Logan Quinn these numbers look a lot better.    T target is 400-600, so that is great.  Estradiol has come down nicely with that extra med. 52 is technically above the 'normal' 40 but not clinically concerning   And the Testosterone : estradiol ratio is >10:1

## 2022-08-22 DIAGNOSIS — Z794 Long term (current) use of insulin: Principal | ICD-10-CM

## 2022-08-22 DIAGNOSIS — E1165 Type 2 diabetes mellitus with hyperglycemia: Principal | ICD-10-CM

## 2022-08-22 MED ORDER — LANTUS U-100 INSULIN 100 UNIT/ML SUBCUTANEOUS SOLUTION
Freq: Every evening | SUBCUTANEOUS | 1 refills | 200 days | Status: CP
Start: 2022-08-22 — End: 2023-08-22

## 2022-08-23 NOTE — Unmapped (Signed)
RX's are being transferred from [facility] and just clarifying Humalog 120 units via omnipod.  [name] has told me in the past it is written that way because it daily dose varies so he doesn't run out.  Clarified with [name] Lorina Rabon is out of the office today) and Melanie at [facility] notified.

## 2022-08-23 NOTE — Unmapped (Signed)
Melanie from [facility] in [place] is calling from phone number [phone] in reference to a new script for Logan Quinn   If someone from the clinic can call in reference to his script Humalog they have some questions in reference to the dosage

## 2022-08-26 NOTE — Unmapped (Signed)
Nothing yet.  Thanks, Alvino Chapel

## 2022-08-28 NOTE — Unmapped (Signed)
Reviewed

## 2022-09-02 MED ORDER — OMNIPOD 5 G6 PODS (GEN 5) SUBCUTANEOUS CARTRIDGE
11 refills | 0 days | Status: CP
Start: 2022-09-02 — End: 2023-09-03

## 2022-09-02 NOTE — Unmapped (Signed)
Patient is requesting the following refill  Requested Prescriptions     Signed Prescriptions Disp Refills    insulin pump cart,automated,BT (OMNIPOD 5 G6 PODS, GEN 5,) Crtg 3 each 11     Sig: Change POD every 48 hours.     Authorizing Provider: Loran Senters     Ordering User: Wyonia Hough       Recent Visits  Date Type Provider Dept   08/21/22 Office Visit Loran Senters, FNP [facility] [address] [facility]   07/31/22 Office Visit Loran Senters, FNP [facility] [address] [facility]   07/02/22 Office Visit Loran Senters, FNP [facility] [address] [facility]   05/21/22 Office Visit Loran Senters, FNP [facility] [address] [facility]   02/27/22 Office Visit Nile Dear, Emogene Morgan, NP [facility] [address] [facility]   02/19/22 Office Visit Loran Senters, FNP [facility] [address] [facility]   01/02/22 Office Visit Loran Senters, FNP [facility] [address] [facility]   11/28/21 Office Visit Loran Senters, FNP [facility] [address] [facility]   10/10/21 Office Visit Loran Senters, FNP [facility] [address] [facility]   09/11/21 Office Visit Loran Senters, FNP [facility] [address] At [facility]   Showing recent visits within past 365 days with a meds authorizing provider and meeting all other requirements  Future Appointments  Date Type Provider Dept   11/26/22 Appointment Loran Senters, FNP [facility] [address] At [facility]   Showing future appointments within next 365 days with a meds authorizing provider and meeting all other requirements       Labs: A1c:   Hemoglobin A1C (%)   Date Value   08/19/2022 7.0 (H)   05/21/2022 6.4 (A)   09/15/2014 6.0    Needs Omnipod to go to [facility] with [facility].  RX sent.

## 2022-09-11 NOTE — Unmapped (Signed)
[facility] Refill Coordination Note    Specialty Medication(s) to be Shipped:   Inflammatory Disorders: Humira    Other medication(s) to be shipped: No additional medications requested for fill at this time     Logan Quinn, DOB: August 06, 1980  Phone: [phone] (home)       All above HIPAA information was verified with patient.     Was a Nurse, learning disability used for this call? No    Completed refill call assessment today to schedule patient's medication shipment from the [facility] [phone]).  All relevant notes have been reviewed.     Specialty medication(s) and dose(s) confirmed: Regimen is correct and unchanged.   Changes to medications: Couper reports no changes at this time.  Changes to insurance: No  New side effects reported not previously addressed with a pharmacist or physician: None reported  Questions for the pharmacist: No    Confirmed patient received a Conservation officer, historic buildings and a Surveyor, mining with first shipment. The patient will receive a drug information handout for each medication shipped and additional FDA Medication Guides as required.       DISEASE/MEDICATION-SPECIFIC INFORMATION        For patients on injectable medications: Patient currently has 2 doses left.  Next injection is scheduled for 4/4.    SPECIALTY MEDICATION ADHERENCE     Medication Adherence    Patient reported X missed doses in the last month: 1  Specialty Medication: Humira (CF) Pen  Patient is on additional specialty medications: No  Informant: patient              Were doses missed due to medication being on hold? No    Humira 80/0.8 mg/ml: 8 days of medicine on hand       REFERRAL TO PHARMACIST     Referral to the pharmacist: Not needed      [facility]     Shipping address confirmed in Epic.     Patient was notified of new phone menu : Yes    Delivery Scheduled: Yes, Expected medication delivery date: 4/10.     Medication will be delivered via Same Day Courier to the prescription address in Epic WAM.    [name]   [facility] Specialty Technician

## 2022-09-17 MED ORDER — INSULIN SYRINGE U-100 WITH NEEDLE 1 ML 31 GAUGE X 5/16" (8 MM)
3 refills | 0 days
Start: 2022-09-17 — End: 2023-09-17

## 2022-09-17 MED ORDER — TESTOSTERONE CYPIONATE 200 MG/ML INTRAMUSCULAR OIL
INTRAMUSCULAR | 5 refills | 28 days
Start: 2022-09-17 — End: ?

## 2022-09-17 MED ORDER — TADALAFIL 5 MG TABLET
ORAL_TABLET | Freq: Every day | ORAL | 3 refills | 90 days
Start: 2022-09-17 — End: ?

## 2022-09-18 MED ORDER — TESTOSTERONE CYPIONATE 200 MG/ML INTRAMUSCULAR OIL
INTRAMUSCULAR | 5 refills | 28 days | Status: CP
Start: 2022-09-18 — End: ?

## 2022-09-18 MED ORDER — TADALAFIL 5 MG TABLET
ORAL_TABLET | Freq: Every day | ORAL | 3 refills | 90 days | Status: CP
Start: 2022-09-18 — End: ?

## 2022-09-18 MED ORDER — INSULIN SYRINGE U-100 WITH NEEDLE 1 ML 31 GAUGE X 5/16" (8 MM)
1 refills | 0 days | Status: CP
Start: 2022-09-18 — End: 2023-09-17

## 2022-09-18 MED FILL — HUMIRA(CF) PEN 80 MG/0.8 ML SUBCUTANEOUS KIT: 28 days supply | Qty: 4 | Fill #2

## 2022-09-25 NOTE — Unmapped (Unsigned)
Pulmonary Clinic - Initial Visit    Referring Physician :  Loran Senters  PCP:     Loran Senters, FNP  Reason for Consult:   Chronic bronchitis      ASSESSMENT and PLAN     Logan Quinn is a 42 y.o. male with severe OSA, GERD, NAFLD, DM2, Hidradenitis Suppurativa on Humira, depression w hx suicidal ideation, obesity (BMI 70) presenting for chronic bronchitis.    Mild persistent asthma: Bronchitis episodes variable responsive to albuterol and prednisone. Eos as high as 300 but 100 lately. VBG 2021 7.39/43. Struggles with Advair especially when sick. Cough a main symptom. PFTs suggestive of reversibility but no fixed obstruction. On Humira for HS, could consider atypical/indolent infx too.   - continue Symbicort  - Continue montelukast  - Continue GERD Rx  - Suspect viral illnesses from young children + some untreated asthma. If worsening and persistent (episdoic currently) then would check ct chest    OSA:  - CPAP restarted    Obesity:  - Continue Trulicity    There are no diagnoses linked to this encounter.      Plan of care was discussed with the patient who acknowledged understanding and is in agreement.    Patient will return to clinic in 6 months or sooner if needed.    This patient was seen and discussed with attending physician, Dr. Hoyle Barr who agrees with the assessment and plan above.     ZO:XWRU Rosita Fire, Loran Senters, FNP    Gaston Islam, DO MD  Pulmonary and Critical Care Fellow  09/25/22   ~~~~~    HISTORY:     History of Present Illness:  Logan Quinn is a 42 y.o. male with a history of the above whom we are seeing in consultation requested by Loran Senters for evaluation of chronic bronchitis.    Here for chronic bronchitis he gets 4-6x/year.    Has been taking Advair discus and albuterol and wants to know what more can be done.    First time he had bronchitis was around age 73. It seems to become more prevalent since then. At first there was a seaonality, like when the seasons change. Winter times are still worse, but it seems more indiscriminanty these days. No odor/perfume sensitivity. Cooler temperatures make it worse. A typical course is a hacking coughing, a little phlegm in the beginning, then just irritating non-productive cough, chest tightness, phlegm is often sticky/thick and green. Usually it just has to run its course, usually drinks hot liquids and uses Vicks vaporub. Albuterol gives temporary relief. In the midst of an attack he doesn't feel the medicine is getting in (Advair). Prednisone and abx don't consistently help. Last episode was Dec-Jan.     No nighttime cough. CPAP machine broken so not using. Will see sleep clinic soon amd is working with [facility] to get a loaner    Prior to 30's no other lung dz. No tobacco or vape or MJ. No inh exposures. Currently disabled and cares for his kids. Pets at home: 3 dogs. Has had dogs since 2008 and most of his life. No birds.     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 They are 2 floors apart, and they have been in the building for 3 years only on Tuesday nights.     Born and raised in [place]. Dad's mom had COPD. Mom has COPD. Both smoke.  Has GERD and has seen GI and ENT who put him on 2x PPI and H2.     Has hidradenitis suppurativa treated with Humira. Started ~ 2020    Interval 11/21/21:  - Using Symbicort well, BID  - Last 2 weeks had to use albuterol a few times while camping and making campfires. 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)     Obstructive sleep apnea     OSA on CPAP     Retinal detachment 04/2014    Severe obstructive sleep apnea     Trapezius muscle strain 12/07/2013    Urinary incontinence, nocturnal enuresis     Venous insufficiency      Past Surgical History:   Procedure Laterality Date    EYE SURGERY  04/2014    LASER ABLATION INCOMPETENT VEIN INI [facility] HISTORICAL RESULT) Right     PR COLONOSCOPY FLX DX W/COLLJ SPEC WHEN PFRMD N/A 03/24/2013    Procedure: COLONOSCOPY, FLEXIBLE, PROXIMAL TO SPLENIC FLEXURE; DIAGNOSTIC, W/WO COLLECTION SPECIMEN BY BRUSH OR WASH;  Surgeon: Clint Bolder, MD;  Location: [facility];  Service: Gastroenterology    PR EYE SURG POST SGMT PROC UNLISTED Left     pneumatic retinopexy OS    PR UPPER GI ENDOSCOPY,DIAGNOSIS N/A 02/02/2013    Procedure: UGI ENDO, INCLUDE ESOPHAGUS, STOMACH, & DUODENUM &/OR JEJUNUM; DX W/WO COLLECTION SPECIMN, BY BRUSH OR WASH;  Surgeon: Malcolm Metro, MD;  Location: [facility];  Service: Gastroenterology    [place] PYLORIC STENOSIS ([facility] HISTORICAL RESULT)         Other History:  The social history and family history were personally reviewed and updated in the patient's electronic medical record.    Family History   Problem Relation Age of Onset    Cancer Maternal Grandfather         Stomach Cancer    Hearing loss Maternal Grandfather     Cancer Paternal Grandfather         Bone Cancer, Lung Cancer    COPD Paternal Grandmother     Arthritis Paternal Grandmother     Depression Paternal Grandmother     Diabetes Mother     Heart disease Mother     Migraines Mother     Arthritis Mother     Depression Mother     GER disease Mother     Hypertension Mother     Angina Mother     COPD Mother     Glaucoma Mother     Hearing loss Mother     Cataracts Mother     Diabetes Sister     Migraines Sister     Asthma Sister     Depression  Sister Hearing loss Sister     Diabetes Brother     Asthma Brother     Diabetes Maternal Grandmother     Heart disease Maternal Grandmother     Migraines Maternal Grandmother     Depression Maternal Grandmother     Angina Maternal Grandmother     Hypertension Maternal Grandmother     Stroke Maternal Grandmother     Diabetes Maternal Uncle     Hearing loss Maternal Uncle     Diabetes Maternal Uncle         resulted in need for kidney transplant    Liver disease Maternal Uncle     Kidney disease Maternal Uncle         needed kidney transplant    Asthma Brother     No Known Problems Father     No Known Problems Paternal Aunt     No Known Problems Paternal Uncle     No Known Problems Other     Colorectal Cancer Neg Hx     Esophageal cancer Neg Hx     Liver cancer Neg Hx     Pancreatic cancer Neg Hx     Stomach cancer Neg Hx     Amblyopia Neg Hx     Blindness Neg Hx     Retinal detachment Neg Hx     Strabismus Neg Hx     Macular degeneration Neg Hx     Anesthesia problems Neg Hx     Broken bones Neg Hx     Clotting disorder Neg Hx     Collagen disease Neg Hx     Dislocations Neg Hx     Fibromyalgia Neg Hx     Gout Neg Hx     Hemophilia Neg Hx     Osteoporosis Neg Hx     Rheumatologic disease Neg Hx     Scoliosis Neg Hx     Severe sprains Neg Hx     Sickle cell anemia Neg Hx     Spinal Compression Fracture Neg Hx     Melanoma Neg Hx     Basal cell carcinoma Neg Hx     Squamous cell carcinoma Neg Hx     Deep vein thrombosis Neg Hx     Thyroid disease Neg Hx      Social History     Socioeconomic History    Marital status: Married   Tobacco Use    Smoking status: Never     Passive exposure: Never    Smokeless tobacco: Never   Vaping Use    Vaping status: Never Used   Substance and Sexual Activity    Alcohol use: Never     Comment: rare social    Drug use: Never    Sexual activity: Yes     Partners: Female     Birth control/protection: None   Other Topics Concern    Do you use sunscreen? Yes    Tanning bed use? No    Are you easily burned? No    Excessive sun exposure? No    Blistering sunburns? No     Social Determinants of Health     Financial Resource Strain: Low Risk  (07/02/2022)    Overall Financial Resource Strain (CARDIA)     Difficulty of Paying Living Expenses: Not hard at all   Food Insecurity: No Food Insecurity (07/02/2022)    Hunger Vital Sign     Worried About Running Out of Food in the Last Year:  Never true     Ran Out of Food in the Last Year: Never true   Transportation Needs: No Transportation Needs (07/02/2022)    PRAPARE - Therapist, art (Medical): No     Lack of Transportation (Non-Medical): No    [facility]    Social Network       Home Medications:  Current Outpatient Medications on File Prior to Visit   Medication Sig Dispense Refill    adalimumab (HUMIRA,CF, PEN) 80 mg/0.8 mL PnKt Inject 80 mg subcutaneously once every week 4 each 11    albuterol 2.5 mg /3 mL (0.083 %) nebulizer solution Inhale 3 mL (2.5 mg total) by nebulization every four (4) hours as needed for wheezing. (Patient not taking: Reported on 08/21/2022) 180 mL 2    albuterol HFA 90 mcg/actuation inhaler Inhale 2 puffs every six (6) hours as needed for wheezing. (Patient not taking: Reported on 08/21/2022) 8.5 g 5    allopurinol (ZYLOPRIM) 100 MG tablet Take 2 tablets (200 mg total) by mouth daily. 180 tablet 1    ALPRAZolam (XANAX) 0.5 MG tablet Takes 1 tablet nightly and 1 tablet a day when needed      anastrozole (ARIMIDEX) 1 mg tablet Take 1 tablet (1 mg total) by mouth every other day. 45 tablet 3    ascorbic acid (VITAMIN C ORAL) Take 1 capsule by mouth nightly.      BD LUER-LOK SYRINGE 3 mL 21 gauge x 1 1/2 Syrg weekly      [EXPIRED] blood sugar diagnostic (ACCU-CHEK GUIDE TEST STRIPS) Strp by Other route Three (3) times a day before meals. 300 each 1    blood-glucose meter kit Use as directed 1 each 0    budesonide-formoteroL (SYMBICORT) 80-4.5 mcg/actuation inhaler Inhale 2 puffs Two (2) times a day. 10.2 g 11    chlorhexidine (PERIDEX) 0.12 % solution 15 mL by Oromucosal route two (2) times a day. 900 mL 3    clindamycin (CLEOCIN T) 1 % lotion Apply topically two (2) times a day. To affected areas on body as needed for flares 60 mL 3    clobetasoL (TEMOVATE) 0.05 % Gel       clobetasoL (TEMOVATE) 0.05 % ointment Apply daily to painful affected areas if needed for flares, then stop 15 g 1    colchicine (MITIGARE) 0.6 mg cap capsule Take 1 capsule (0.6 mg total) by mouth daily. 90 capsule 0    cyclobenzaprine (FLEXERIL) 10 MG tablet Take 1 tablet (10 mg total) by mouth Three (3) times a day as needed for muscle spasms. 60 tablet 1    dextroamphetamine sulfate (DEXTROSTAT) 10 MG tablet Take 2 tablets (20 mg total) by mouth two (2) times a day.      dicyclomine (BENTYL) 10 mg capsule Take 1 capsule (10 mg total) by mouth Four (4) times a day (before meals and nightly). 120 capsule 3    dulaglutide (TRULICITY) 4.5 mg/0.5 mL PnIj Inject 4.5 mg under the skin every seven (7) days. 2 mL 2    DULoxetine (CYMBALTA) 60 MG capsule Take 1 capsule (60 mg total) by mouth Two (2) times a day. 180 capsule 0    empty container Misc Use as directed to dispose of needles. When full, make sure lid is closed tightly then dispose of container in trash. 1 each 2    erenumab-aooe (AIMOVIG AUTOINJECTOR) 140 mg/mL AtIn Inject 1 Pen under the skin every thirty (30) days.  3 mL 1    famotidine (PEPCID) 20 MG tablet Take 1 tablet (20 mg total) by mouth two (2) times a day. 180 tablet 1    fluoride, sodium, 1.1 % Pste       fluticasone propionate (FLONASE) 50 mcg/actuation nasal spray 1 spray into each nostril two (2) times a day. 16 g 5    glipiZIDE (GLUCOTROL) 10 MG tablet Take 2 tablets (20 mg total) by mouth Two (2) times a day (30 minutes before a meal). 360 tablet 3    HUMALOG U-100 INSULIN 100 unit/mL injection INJECT 120 UNITS UNDER THE SKIN DAILY VIA OMNIPOD 40 mL 5    hydrocortisone (PROCTOSOL HC) 2.5 % rectal cream Insert 1 application into the rectum two (2) times a day as needed. 28.35 g 2    hydrOXYzine (ATARAX) 25 MG tablet Take 1 tablet (25 mg total) by mouth Three (3) times a day as needed. May take two before bed for sleep. 120 tablet 1    ibuprofen (ADVIL,MOTRIN) 800 MG tablet Take 1 tablet (800 mg total) by mouth every eight (8) hours as needed. 90 tablet 1    insulin glargine (LANTUS U-100 INSULIN) 100 unit/mL injection Inject 0.05 mL (5 Units total) under the skin nightly. 10 mL 1    insulin pump cart,automated,BT (OMNIPOD 5 G6 PODS, GEN 5,) Crtg Change POD every 48 hours. 3 each 11    insulin syringe-needle U-100 1 mL 31 gauge x 5/16 (8 mm) Syrg Once daily for insulin dosing. 100 each 1    ketoconazole (NIZORAL) 2 % cream Apply 1 Application topically daily. To affected area on body until clear 60 g 3    lancets (ACCU-CHEK FASTCLIX LANCET DRUM) Misc Use to check blood sugars 3 times a day before meals or as directed 200 each 5    levothyroxine (SYNTHROID) 50 MCG tablet Take 1 tablet (50 mcg total) by mouth daily. 90 tablet 1    lidocaine (XYLOCAINE) 5 % ointment Apply topically Three (3) times a day as needed. 30 g 1    losartan (COZAAR) 25 MG tablet Take 1 tablet (25 mg total) by mouth daily. 90 tablet 3    montelukast (SINGULAIR) 10 mg tablet Take 1 tablet (10 mg total) by mouth daily. 90 tablet 3    multivitamin (MULTIPLE VITAMIN ORAL) Take 1 tablet by mouth daily.      [EXPIRED] mupirocin (BACTROBAN) 2 % ointment Apply topically two (2) times a day for 7 days. 30 g 0    ondansetron (ZOFRAN) 4 MG tablet Take 1 tablet (4 mg total) by mouth every eight (8) hours as needed for nausea. 30 tablet 1    oxybutynin (DITROPAN) 5 MG tablet Take 1 tablet (5 mg total) by mouth Two (2) times a day. 180 tablet 1    pantoprazole (PROTONIX) 40 MG tablet Take 1 tablet (40 mg total) by mouth two (2) times a day. 180 tablet 1    polyethylene glycol (GLYCOLAX) 17 gram/dose powder       promethazine (PHENERGAN) 25 MG tablet Take 1 tablet (25 mg total) by mouth every eight (8) hours as needed for nausea. 30 tablet 1    propranoloL (INDERAL LA) 120 mg 24 hr capsule Take 1 capsule (120 mg total) by mouth daily. 90 capsule 1    ramelteon (ROZEREM) 8 mg tablet Take 1 tablet (8 mg total) by mouth nightly.      safety needles (BD SAFETYGLIDE NEEDLE) 23 gauge x 1 Ndle 1 Units  by Miscellaneous route once a week. 12 each 11    safety needles 18 gauge x 1 1/2 Ndle 1 Units by Miscellaneous route once a week. 12 each 11    sour cherry extract (TART CHERRY EXTRACT) 1,000 mg cap       tadalafil (CIALIS) 5 MG tablet Take 1 tablet (5 mg total) by mouth in the morning. 90 tablet 3    testosterone cypionate (DEPOTESTOTERONE CYPIONATE) 200 mg/mL injection Inject 0.5 mL (100 mg total) into the muscle once a week. Inject 0.5cc (100mg ) weekly 2 mL 5    topiramate (TOPAMAX) 50 MG tablet Take 1.5 tablets (75 mg total) by mouth two (2) times a day. 270 tablet 1    WELLBUTRIN XL 150 mg 24 hr tablet Take 1 tablet (150 mg total) by mouth every morning.       No current facility-administered medications on file prior to visit.       Allergies:  Allergies as of 09/25/2022 - Reviewed 08/26/2022   Allergen Reaction Noted    Erythromycin Nausea And Vomiting 01/25/2015    Penicillins Rash, Swelling, and Hives 12/06/2012    Sulfa (sulfonamide antibiotics) Nausea And Vomiting and Nausea Only 12/30/2012    Other         Review of Systems:  A comprehensive review of systems was completed and negative except as noted in HPI.    PHYSICAL EXAM:   There were no vitals taken for this visit.  GEN: NAD, sitting in chair, obese  EYES: EOMI, sclera anicteric  ENT: Trachea midline, MMM  CV: RRR, no murmurs appreciated  PULM: CTA B, normal WoB, no stridor  ABD: soft, non-tender, non-distended  EXT: No edema  NEURO: Grossly Non-focal, moving all extremities normally  PSYCH: A+Ox3, appropriate  MSK: no obvious joint deformities of b/l hands      LABORATORY and RADIOLOGY DATA:     Pulmonary Function Tests/Interpretation:        Pertinent Laboratory Data:    Personally reviewed in EMR    Pertinent Imaging Data:  Personally reviewed in EMR

## 2022-10-01 MED ORDER — DIPHENOXYLATE-ATROPINE 2.5 MG-0.025 MG TABLET
ORAL_TABLET | Freq: Four times a day (QID) | ORAL | 1 refills | 8 days | Status: CP | PRN
Start: 2022-10-01 — End: 2023-10-01

## 2022-10-01 NOTE — Unmapped (Signed)
Addended by: Barbaraann Boys on: 10/01/2022 12:45 PM     Modules accepted: Orders

## 2022-10-01 NOTE — Unmapped (Signed)
Addended by: [name] on: 10/01/2022 04:14 PM     Modules accepted: Orders

## 2022-10-01 NOTE — Unmapped (Signed)
See MyChart message

## 2022-10-02 MED ORDER — DIPHENOXYLATE-ATROPINE 2.5 MG-0.025 MG TABLET
ORAL_TABLET | Freq: Four times a day (QID) | ORAL | 1 refills | 8 days | Status: CP | PRN
Start: 2022-10-02 — End: 2023-10-02

## 2022-10-03 NOTE — Unmapped (Signed)
Wants Lomotil sent to [facility] instead of [facility]. Canceled RX at [facility].  Won't let me send it.

## 2022-10-14 ENCOUNTER — Ambulatory Visit
Admit: 2022-10-14 | Discharge: 2022-10-15 | Payer: MEDICARE | Attending: Student in an Organized Health Care Education/Training Program | Primary: Student in an Organized Health Care Education/Training Program

## 2022-10-14 DIAGNOSIS — G4733 Obstructive sleep apnea (adult) (pediatric): Principal | ICD-10-CM

## 2022-10-14 DIAGNOSIS — J4530 Mild persistent asthma, uncomplicated: Principal | ICD-10-CM

## 2022-10-14 NOTE — Unmapped (Signed)
Hello Logan Quinn,    It was great meeting you today. Please see our plan from today below:  - Please start using your symbicort only as needed (not twice a day) unless you start having more symptoms  - Continue your montelukast    Please return to clinic in 12 months.     If you have any non-urgent questions or concerns please reach out by calling the clinic [phone]) or via MyChart.    Chelsia Serres Griffith Citron, DO  Pulmonary and Critical Care Fellow

## 2022-10-14 NOTE — Unmapped (Signed)
Pulmonary Clinic - Initial Visit    Referring Physician :  Loran Quinn  PCP:     Logan Quinn  Reason for Consult:   Chronic bronchitis      ASSESSMENT and PLAN     Logan Quinn is a 42 y.o. male with severe OSA, GERD, NAFLD, DM2, Hidradenitis Suppurativa on Humira, depression w hx suicidal ideation, obesity (BMI 70) presenting for chronic bronchitis.    Mild intermittent asthma: Bronchitis episodes variable responsive to albuterol and prednisone. Eos as high as 300 but 100 lately. VBG 2021 7.39/43. Struggles with Advair especially when sick. Cough a main symptom. PFTs suggestive of reversibility but no fixed obstruction. On Humira for HS, could consider atypical/indolent infx too. No s/s of infections at this time.Logan Quinn Symbicort to PRN (instead of BID). Patient instructed to step up PRN  - Continue montelukast  - Continue GERD Rx  - RTC in 1 year or PRN    Immunizations - UTD  - COVID last 12/27/20  - Pneumococcal 01/01/22, 12/30/12  - Tdap 07/16/13  - Flu 02/19/22    OSA:  - CPAP qhs    Obesity:  - Continue Trulicity    Diagnoses and all orders for this visit:    Mild persistent asthma without complication    OSA (obstructive sleep apnea)      Plan of care was discussed with the patient who acknowledged understanding and is in agreement.    Patient will return to clinic in 12 months or sooner if needed.    This patient was seen and discussed with attending physician, Dr. Donnie Quinn who agrees with the assessment and plan above.     GN:FAOZ Logan Quinn, Logan Quinn    Logan Islam, DO MD  Pulmonary and Critical Care Fellow  10/14/22   ~~~~~    HISTORY:     History of Present Illness:  Logan Quinn is a 42 y.o. male with a history of the above whom we are seeing in consultation requested by Logan Quinn for evaluation of chronic bronchitis.    Here for bronchitis he gets 4-6x/year.    Has been taking Advair discus and albuterol and wants to know what more can be done.    First time he had bronchitis was around age 6. It seems to become more prevalent since then. At first there was a seaonality, like when the seasons change. Winter times are still worse, but it seems more indiscriminanty these days. No odor/perfume sensitivity. Cooler temperatures make it worse. A typical course is a hacking coughing, a little phlegm in the beginning, then just irritating non-productive cough, chest tightness, phlegm is often sticky/thick and green. Usually it just has to run its course, usually drinks hot liquids and uses Vicks vaporub. Albuterol gives temporary relief. In the midst of an attack he doesn't feel the medicine is getting in (Advair). Prednisone and abx don't consistently help. Last episode was Dec-Jan.     No nighttime cough. CPAP machine broken so not using. Will see sleep clinic soon amd is working with [facility] to get a loaner    Prior to 30's no other lung dz. No tobacco or vape or MJ. No inh exposures. Currently disabled and cares for his kids. Pets at home: 3 dogs. Has had dogs since 2008 and most of his life. No birds.     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 They are 2 floors apart, and they have been in the building for 3 years only on Tuesday nights.     Born and raised in [place]. Dad's mom had COPD. Mom has COPD. Both smoke.     Has GERD and has seen GI and ENT who put him on 2x PPI and H2.     Has hidradenitis suppurativa treated with Humira. Started ~ 2020    Interval 11/21/21:  - Using Symbicort well, BID  - Last 2 weeks had to use albuterol a few times while camping and making campfires. Otherwise rarely except when sick    Interval history 10/14/22  - Here with Logan Quinn (son) and wife   - Symbicort BID   - Albuterol PRN (has only used 1-2 times in the last year)  - No nighttime awakenings  - Can walk about 5 minutes before stopping to catch his breath.  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)     Obstructive sleep apnea     OSA on CPAP     Retinal detachment 04/2014    Severe obstructive sleep apnea     Trapezius muscle strain 12/07/2013    Urinary incontinence, nocturnal enuresis     Venous insufficiency      Past Surgical History:   Procedure Laterality Date    EYE SURGERY  04/2014    LASER ABLATION INCOMPETENT VEIN INI [facility] HISTORICAL RESULT) Right     PR COLONOSCOPY FLX DX W/COLLJ SPEC WHEN PFRMD N/A 03/24/2013    Procedure: COLONOSCOPY, FLEXIBLE, PROXIMAL TO SPLENIC FLEXURE; DIAGNOSTIC, W/WO COLLECTION SPECIMEN BY BRUSH OR WASH;  Surgeon: Logan Bolder, MD;  [facility];  Service: Gastroenterology    PR EYE SURG POST SGMT PROC UNLISTED Left     pneumatic retinopexy OS    PR UPPER GI ENDOSCOPY,DIAGNOSIS N/A 02/02/2013    Procedure: UGI ENDO, INCLUDE ESOPHAGUS, STOMACH, & DUODENUM &/OR JEJUNUM; DX W/WO COLLECTION SPECIMN, BY BRUSH OR WASH;  Surgeon: Logan Metro, MD;  Location: [facility];  Service: Gastroenterology    [place] PYLORIC STENOSIS ([place] HISTORICAL RESULT)         Other History:  The social history and family history were personally reviewed and updated in the patient's electronic medical record.    Family History   Problem Relation Age of Onset    Cancer Maternal Grandfather         Stomach Cancer    Hearing loss Maternal Grandfather     Cancer Paternal Grandfather         Bone Cancer, Lung Cancer    COPD Paternal Grandmother     Arthritis Paternal Grandmother     Depression Paternal Grandmother  Diabetes Mother     Heart disease Mother     Migraines Mother     Arthritis Mother     Depression Mother     GER disease Mother     Hypertension Mother     Angina Mother     COPD Mother     Glaucoma Mother     Hearing loss Mother     Cataracts Mother     Diabetes Sister     Migraines Sister     Asthma Sister     Depression Sister     Hearing loss Sister     Diabetes Brother     Asthma Brother     Diabetes Maternal Grandmother     Heart disease Maternal Grandmother     Migraines Maternal Grandmother     Depression Maternal Grandmother     Angina Maternal Grandmother     Hypertension Maternal Grandmother     Stroke Maternal Grandmother     Diabetes Maternal Uncle     Hearing loss Maternal Uncle     Diabetes Maternal Uncle         resulted in need for kidney transplant    Liver disease Maternal Uncle     Kidney disease Maternal Uncle         needed kidney transplant    Asthma Brother     No Known Problems Father     No Known Problems Paternal Aunt     No Known Problems Paternal Uncle     No Known Problems Other     Colorectal Cancer Neg Hx     Esophageal cancer Neg Hx     Liver cancer Neg Hx     Pancreatic cancer Neg Hx     Stomach cancer Neg Hx     Amblyopia Neg Hx     Blindness Neg Hx     Retinal detachment Neg Hx     Strabismus Neg Hx     Macular degeneration Neg Hx     Anesthesia problems Neg Hx     Broken bones Neg Hx     Clotting disorder Neg Hx     Collagen disease Neg Hx     Dislocations Neg Hx     Fibromyalgia Neg Hx     Gout Neg Hx     Hemophilia Neg Hx     Osteoporosis Neg Hx     Rheumatologic disease Neg Hx     Scoliosis Neg Hx     Severe sprains Neg Hx     Sickle cell anemia Neg Hx     Spinal Compression Fracture Neg Hx     Melanoma Neg Hx     Basal cell carcinoma Neg Hx     Squamous cell carcinoma Neg Hx     Deep vein thrombosis Neg Hx     Thyroid disease Neg Hx      Social History     Socioeconomic History    Marital status: Married   Tobacco Use    Smoking status: Never     Passive exposure: Never    Smokeless tobacco: Never   Vaping Use    Vaping status: Never Used   Substance and Sexual Activity    Alcohol use: Never     Comment: rare social    Drug use: Never    Sexual activity: Yes     Partners: Female     Birth control/protection: None   Other Topics Concern    Do you use  sunscreen? Yes    Tanning bed use? No    Are you easily burned? No    Excessive sun exposure? No    Blistering sunburns? No     Social Determinants of Health     Financial Resource Strain: Low Risk  (07/02/2022)    Overall Financial Resource Strain (CARDIA)     Difficulty of Paying Living Expenses: Not hard at all   Food Insecurity: No Food Insecurity (07/02/2022)    Hunger Vital Sign     Worried About Running Out of Food in the Last Year: Never true     Ran Out of Food in the Last Year: Never true   Transportation Needs: No Transportation Needs (07/02/2022)    PRAPARE - Therapist, art (Medical): No     Lack of Transportation (Non-Medical): No    Received from [facility]    Social Network       Home Medications:  Current Outpatient Medications on File Prior to Visit   Medication Sig Dispense Refill    adalimumab (HUMIRA,CF, PEN) 80 mg/0.8 mL PnKt Inject 80 mg subcutaneously once every week 4 each 11    albuterol HFA 90 mcg/actuation inhaler Inhale 2 puffs every six (6) hours as needed for wheezing. 8.5 g 5    allopurinol (ZYLOPRIM) 100 MG tablet Take 2 tablets (200 mg total) by mouth daily. 180 tablet 1    ALPRAZolam (XANAX) 0.5 MG tablet Takes 1 tablet nightly and 1 tablet a day when needed      anastrozole (ARIMIDEX) 1 mg tablet Take 1 tablet (1 mg total) by mouth every other day. 45 tablet 3    ascorbic acid (VITAMIN C ORAL) Take 1 capsule by mouth nightly.      BD LUER-LOK SYRINGE 3 mL 21 gauge x 1 1/2 Syrg weekly      budesonide-formoteroL (SYMBICORT) 80-4.5 mcg/actuation inhaler Inhale 2 puffs Two (2) times a day. 10.2 g 11    chlorhexidine (PERIDEX) 0.12 % solution 15 mL by Oromucosal route two (2) times a day. 900 mL 3    clindamycin (CLEOCIN T) 1 % lotion Apply topically two (2) times a day. To affected areas on body as needed for flares 60 mL 3    clobetasoL (TEMOVATE) 0.05 % ointment Apply daily to painful affected areas if needed for flares, then stop 15 g 1    colchicine (MITIGARE) 0.6 mg cap capsule Take 1 capsule (0.6 mg total) by mouth daily. 90 capsule 0    cyclobenzaprine (FLEXERIL) 10 MG tablet Take 1 tablet (10 mg total) by mouth Three (3) times a day as needed for muscle spasms. 60 tablet 1    dextroamphetamine sulfate (DEXTROSTAT) 10 MG tablet Take 2 tablets (20 mg total) by mouth two (2) times a day.      dicyclomine (BENTYL) 10 mg capsule Take 1 capsule (10 mg total) by mouth Four (4) times a day (before meals and nightly). 120 capsule 3    diphenoxylate-atropine (LOMOTIL) 2.5-0.025 mg per tablet Take 1 tablet by mouth four (4) times a day as needed for diarrhea. 30 tablet 1    dulaglutide (TRULICITY) 4.5 mg/0.5 mL PnIj Inject 4.5 mg under the skin every seven (7) days. 2 mL 2    DULoxetine (CYMBALTA) 60 MG capsule Take 1 capsule (60 mg total) by mouth Two (2) times a day. 180 capsule 0    empty container Misc Use as directed to dispose of needles. When full, make  sure lid is closed tightly then dispose of container in trash. 1 each 2    erenumab-aooe (AIMOVIG AUTOINJECTOR) 140 mg/mL AtIn Inject 1 Pen under the skin every thirty (30) days. 3 mL 1    famotidine (PEPCID) 20 MG tablet Take 1 tablet (20 mg total) by mouth two (2) times a day. 180 tablet 1    fluoride, sodium, 1.1 % Pste       fluticasone propionate (FLONASE) 50 mcg/actuation nasal spray 1 spray into each nostril two (2) times a day. 16 g 5    glipiZIDE (GLUCOTROL) 10 MG tablet Take 2 tablets (20 mg total) by mouth Two (2) times a day (30 minutes before a meal). 360 tablet 3    HUMALOG U-100 INSULIN 100 unit/mL injection INJECT 120 UNITS UNDER THE SKIN DAILY VIA OMNIPOD 40 mL 5    hydrocortisone (PROCTOSOL HC) 2.5 % rectal cream Insert 1 application into the rectum two (2) times a day as needed. 28.35 g 2    hydrOXYzine (ATARAX) 25 MG tablet Take 1 tablet (25 mg total) by mouth Three (3) times a day as needed. May take two before bed for sleep. 120 tablet 1    ibuprofen (ADVIL,MOTRIN) 800 MG tablet Take 1 tablet (800 mg total) by mouth every eight (8) hours as needed. 90 tablet 1    insulin glargine (LANTUS U-100 INSULIN) 100 unit/mL injection Inject 0.05 mL (5 Units total) under the skin nightly. 10 mL 1    insulin pump cart,automated,BT (OMNIPOD 5 G6 PODS, GEN 5,) Crtg Change POD every 48 hours. 3 each 11    insulin syringe-needle U-100 1 mL 31 gauge x 5/16 (8 mm) Syrg Once daily for insulin dosing. 100 each 1    ketoconazole (NIZORAL) 2 % cream Apply 1 Application topically daily. To affected area on body until clear 60 g 3    lancets (ACCU-CHEK FASTCLIX LANCET DRUM) Misc Use to check blood sugars 3 times a day before meals or as directed 200 each 5    levothyroxine (SYNTHROID) 50 MCG tablet Take 1 tablet (50 mcg total) by mouth daily. 90 tablet 1    lidocaine (XYLOCAINE) 5 % ointment Apply topically Three (3) times a day as needed. 30 g 1    losartan (COZAAR) 25 MG tablet Take 1 tablet (25 mg total) by mouth daily. 90 tablet 3    montelukast (SINGULAIR) 10 mg tablet Take 1 tablet (10 mg total) by mouth daily. 90 tablet 3    multivitamin (MULTIPLE VITAMIN ORAL) Take 1 tablet by mouth daily.      ondansetron (ZOFRAN) 4 MG tablet Take 1 tablet (4 mg total) by mouth every eight (8) hours as needed for nausea. 30 tablet 1    pantoprazole (PROTONIX) 40 MG tablet Take 1 tablet (40 mg total) by mouth two (2) times a day. 180 tablet 1    polyethylene glycol (GLYCOLAX) 17 gram/dose powder       promethazine (PHENERGAN) 25 MG tablet Take 1 tablet (25 mg total) by mouth every eight (8) hours as needed for nausea. 30 tablet 1    propranoloL (INDERAL LA) 120 mg 24 hr capsule Take 1 capsule (120 mg total) by mouth daily. 90 capsule 1    ramelteon (ROZEREM) 8 mg tablet Take 1 tablet (8 mg total) by mouth nightly.      safety needles (BD SAFETYGLIDE NEEDLE) 23 gauge x 1 Ndle 1 Units by Miscellaneous route once a week. 12 each 11    safety  needles 18 gauge x 1 1/2 Ndle 1 Units by Miscellaneous route once a week. 12 each 11    sour cherry extract (TART CHERRY EXTRACT) 1,000 mg cap       tadalafil (CIALIS) 5 MG tablet Take 1 tablet (5 mg total) by mouth in the morning. 90 tablet 3    testosterone cypionate (DEPOTESTOTERONE CYPIONATE) 200 mg/mL injection Inject 0.5 mL (100 mg total) into the muscle once a week. Inject 0.5cc (100mg ) weekly 2 mL 5    topiramate (TOPAMAX) 50 MG tablet Take 1.5 tablets (75 mg total) by mouth two (2) times a day. 270 tablet 1    WELLBUTRIN XL 150 mg 24 hr tablet Take 1 tablet (150 mg total) by mouth every morning.      albuterol 2.5 mg /3 mL (0.083 %) nebulizer solution Inhale 3 mL (2.5 mg total) by nebulization every four (4) hours as needed for wheezing. (Patient not taking: Reported on 08/21/2022) 180 mL 2    [EXPIRED] blood sugar diagnostic (ACCU-CHEK GUIDE TEST STRIPS) Strp by Other route Three (3) times a day before meals. 300 each 1    blood-glucose meter kit Use as directed 1 each 0    clobetasoL (TEMOVATE) 0.05 % Gel       oxybutynin (DITROPAN) 5 MG tablet Take 1 tablet (5 mg total) by mouth Two (2) times a day. 180 tablet 1     No current facility-administered medications on file prior to visit.       Allergies:  Allergies as of 10/14/2022 - Reviewed 10/14/2022   Allergen Reaction Noted    Erythromycin Nausea And Vomiting 01/25/2015    Penicillins Rash, Swelling, and Hives 12/06/2012    Sulfa (sulfonamide antibiotics) Nausea And Vomiting and Nausea Only 12/30/2012    Other         Review of Systems:  A comprehensive review of systems was completed and negative except as noted in HPI.    PHYSICAL EXAM:   BP 123/78 (BP Site: L Arm, BP Position: Sitting, BP Cuff Size: Medium) - Pulse 69  - Temp 36.4 ??C (97.6 ??F) (Temporal)  - Wt (!) 213.6 kg (471 lb)  - SpO2 96%  - BMI 65.72 kg/m??   GEN: NAD, sitting in chair, morbid obesity BMI 65  EYES: EOMI, sclera anicteric  ENT: Trachea midline, MMM  CV: RRR, no murmurs appreciated  PULM: CTAB (decreased breath sounds given body habitus), normal WoB, no stridor  ABD: soft, non-tender, non-distended  EXT: 1+ peripheral pitting edema  NEURO: Grossly Non-focal, moving all extremities normally  PSYCH: A+Ox3, appropriate  MSK: no obvious joint deformities of b/l hands    LABORATORY and RADIOLOGY DATA:     Pulmonary Function Tests/Interpretation:        Pertinent Laboratory Data:    Personally reviewed in EMR    Pertinent Imaging Data:  Personally reviewed in EMR

## 2022-10-15 MED ORDER — DIPHENOXYLATE-ATROPINE 2.5 MG-0.025 MG TABLET
ORAL_TABLET | Freq: Four times a day (QID) | ORAL | 1 refills | 8 days | Status: CP | PRN
Start: 2022-10-15 — End: 2023-10-15

## 2022-10-15 NOTE — Unmapped (Signed)
Still has not gotten Lomotil.  Send to [facility].

## 2022-10-16 NOTE — Unmapped (Signed)
Left message sent to [facility].

## 2022-10-18 NOTE — Unmapped (Signed)
Signed CPAP supply order faxed to [facility] May 10.2024 8:27 AM.  Fax confirmation received.

## 2022-10-18 NOTE — Unmapped (Signed)
[facility] Refill Coordination Note    Specialty Medication(s) to be Shipped:   Inflammatory Disorders: Humira    Other medication(s) to be shipped: No additional medications requested for fill at this time     Logan Quinn, DOB: 12-09-1980  Phone: [phone] (home)       All above HIPAA information was verified with patient.     Was a Nurse, learning disability used for this call? No    Completed refill call assessment today to schedule patient's medication shipment from the [facility] [phone]).  All relevant notes have been reviewed.     Specialty medication(s) and dose(s) confirmed: Regimen is correct and unchanged.   Changes to medications: Dajion reports no changes at this time.  Changes to insurance: No  New side effects reported not previously addressed with a pharmacist or physician: None reported  Questions for the pharmacist: No    Confirmed patient received a Conservation officer, historic buildings and a Surveyor, mining with first shipment. The patient will receive a drug information handout for each medication shipped and additional FDA Medication Guides as required.       DISEASE/MEDICATION-SPECIFIC INFORMATION        For patients on injectable medications: Patient currently has 1 doses left.  Next injection is scheduled for 5/10.    SPECIALTY MEDICATION ADHERENCE     Medication Adherence    Patient reported X missed doses in the last month: 0  Specialty Medication: adalimumab (HUMIRA,CF, PEN) 80 mg/0.8 mL PnKt  Patient is on additional specialty medications: No  Informant: patient              Were doses missed due to medication being on hold? No    Humira 80/0.8 mg/ml: 1 days of medicine on hand       REFERRAL TO PHARMACIST     Referral to the pharmacist: Not needed      [facility]     Shipping address confirmed in Epic.     Patient was notified of new phone menu : Yes    Delivery Scheduled: Yes, Expected medication delivery date: 5/15.     Medication will be delivered via Same Day Courier to the prescription address in Epic WAM.    Logan Quinn   [facility] Specialty Technician

## 2022-10-23 MED ORDER — HYDROCORTISONE 2.5 % TOPICAL CREAM WITH PERINEAL APPLICATOR
Freq: Two times a day (BID) | RECTAL | 2 refills | 14 days | PRN
Start: 2022-10-23 — End: ?

## 2022-10-23 MED FILL — HUMIRA(CF) PEN 80 MG/0.8 ML SUBCUTANEOUS KIT: 28 days supply | Qty: 4 | Fill #3

## 2022-10-24 DIAGNOSIS — G43909 Migraine, unspecified, not intractable, without status migrainosus: Principal | ICD-10-CM

## 2022-10-24 MED ORDER — AIMOVIG AUTOINJECTOR 140 MG/ML SUBCUTANEOUS AUTO-INJECTOR
SUBCUTANEOUS | 1 refills | 0 days
Start: 2022-10-24 — End: 2023-10-24

## 2022-10-24 MED ORDER — HYDROCORTISONE 2.5 % TOPICAL CREAM WITH PERINEAL APPLICATOR
Freq: Two times a day (BID) | RECTAL | 2 refills | 14 days | Status: CP | PRN
Start: 2022-10-24 — End: 2023-10-23

## 2022-10-24 NOTE — Unmapped (Signed)
Patient is requesting the following refill  Requested Prescriptions     Pending Prescriptions Disp Refills    hydrocortisone (PROCTOSOL HC) 2.5 % rectal cream 28.35 g 2     Sig: Insert 1 Application into the rectum two (2) times a day as needed.       Recent Visits  Date Type Provider Dept   08/21/22 Office Visit Loran Senters, FNP [place] [facility] [address] [facility]   07/31/22 Office Visit Loran Senters, FNP [place] [facility] [address] [facility]   07/02/22 Office Visit Loran Senters, FNP [place] [facility] [address] [facility]   05/21/22 Office Visit Loran Senters, FNP [place] [facility] [address] [facility]   02/27/22 Office Visit Nile Dear, Emogene Morgan, NP [place] [facility] [address] [facility]   02/19/22 Office Visit Loran Senters, FNP [place] [facility] [address] [facility]   01/02/22 Office Visit Loran Senters, FNP [place] [facility] [address] At [facility]   11/28/21 Office Visit Loran Senters, FNP [place] [facility] [address] [facility]   Showing recent visits within past 365 days with a meds authorizing provider and meeting all other requirements  Future Appointments  Date Type Provider Dept   11/26/22 Appointment Loran Senters, FNP [place] [facility] [address] [facility]   Showing future appointments within next 365 days with a meds authorizing provider and meeting all other requirements

## 2022-10-25 MED ORDER — AIMOVIG AUTOINJECTOR 140 MG/ML SUBCUTANEOUS AUTO-INJECTOR
SUBCUTANEOUS | 1 refills | 0 days | Status: CP
Start: 2022-10-25 — End: 2023-10-25

## 2022-10-25 NOTE — Unmapped (Signed)
Patient is requesting the following refill  Requested Prescriptions     Pending Prescriptions Disp Refills    erenumab-aooe (AIMOVIG AUTOINJECTOR) 140 mg/mL AtIn 3 mL 1     Sig: Inject 1 Pen under the skin every thirty (30) days.       Recent Visits  Date Type Provider Dept   08/21/22 Office Visit Loran Senters, FNP [place] [facility] [address] [facility]   07/31/22 Office Visit Loran Senters, FNP [place] [facility] [address] [facility]   07/02/22 Office Visit Loran Senters, FNP [place] [facility] [address] [facility]   05/21/22 Office Visit Loran Senters, FNP [place] [facility] [address] [facility]   02/27/22 Office Visit Nile Dear, Emogene Morgan, NP [place] [facility] [address] At [facility]   02/19/22 Office Visit Loran Senters, FNP [place] [facility] [address] [facility]   01/02/22 Office Visit Loran Senters, FNP [place] [facility] [address] At [facility]   11/28/21 Office Visit Loran Senters, FNP [place] [facility] [address] [facility]   Showing recent visits within past 365 days with a meds authorizing provider and meeting all other requirements  Future Appointments  Date Type Provider Dept   11/26/22 Appointment Loran Senters, FNP [place] [facility] [address] [facility]   Showing future appointments within next 365 days with a meds authorizing provider and meeting all other requirements       Labs: Not applicable this refill

## 2022-10-26 MED ORDER — LEVOTHYROXINE 50 MCG TABLET
ORAL_TABLET | Freq: Every day | ORAL | 1 refills | 90 days
Start: 2022-10-26 — End: 2023-10-26

## 2022-10-26 MED ORDER — BUDESONIDE-FORMOTEROL HFA 80 MCG-4.5 MCG/ACTUATION AEROSOL INHALER
Freq: Two times a day (BID) | RESPIRATORY_TRACT | 11 refills | 31 days
Start: 2022-10-26 — End: ?

## 2022-10-26 MED ORDER — ALBUTEROL SULFATE HFA 90 MCG/ACTUATION AEROSOL INHALER
Freq: Four times a day (QID) | RESPIRATORY_TRACT | 5 refills | 0 days | PRN
Start: 2022-10-26 — End: 2023-10-26

## 2022-10-28 MED ORDER — LEVOTHYROXINE 50 MCG TABLET
ORAL_TABLET | Freq: Every day | ORAL | 1 refills | 90 days | Status: CP
Start: 2022-10-28 — End: 2023-10-28

## 2022-10-28 MED ORDER — BUDESONIDE-FORMOTEROL HFA 80 MCG-4.5 MCG/ACTUATION AEROSOL INHALER
Freq: Two times a day (BID) | RESPIRATORY_TRACT | 11 refills | 31 days | Status: CP
Start: 2022-10-28 — End: ?

## 2022-10-28 MED ORDER — ALBUTEROL SULFATE HFA 90 MCG/ACTUATION AEROSOL INHALER
Freq: Four times a day (QID) | RESPIRATORY_TRACT | 5 refills | 0 days | Status: CP | PRN
Start: 2022-10-28 — End: 2023-10-28

## 2022-10-28 NOTE — Unmapped (Signed)
PA AIMOVIG APPROVED

## 2022-11-14 ENCOUNTER — Ambulatory Visit: Payer: 59 | Admitting: Podiatry

## 2022-11-14 NOTE — Unmapped (Signed)
Assessment and Plan:     Type 2 diabetes mellitus with hyperglycemia, with long-term current use of insulin (CMS-HCC)  HGB A1c 7.9 (up from 7.0 three months ago).   DM uncontrolled. Continue Trulicity 4.5 mg weekly, Humalog up to 120 units daily via Omnipod, Lantus 5 units nightly, glipizide 20 mg BID. Patient would like to switch from Trulicity to Ozempic. Continue Trulicity until able to obtain Ozempic. Trial semaglutide 0.25 mg weekly titrating dose up every 4 weeks.  Encouraged patient to continue carb controlled diet and regular exercise.   - POCT glycosylated hemoglobin (Hb A1C); Future  - Albumin/creatinine urine ratio; Future  - Ambulatory referral to Ophthalmology; Future  - Albumin/creatinine urine ratio  - POCT glycosylated hemoglobin (Hb A1C)  - semaglutide 0.25 mg or 0.5 mg (2 mg/3 mL) PnIj; Inject 0.25 mg under the skin every seven (7) days.  - atorvastatin (LIPITOR) 20 MG tablet; Take 1 tablet (20 mg total) by mouth daily.    Migraine without status migrainosus, not intractable, unspecified migraine type  - topiramate (TOPAMAX) 50 MG tablet; Take 1.5 tablets (75 mg total) by mouth two (2) times a day.    Gastroesophageal reflux disease, unspecified whether esophagitis present  Well controlled. Reviewed GERD precautions. Continue famotidine 20 mg daily.  - famotidine (PEPCID) 20 MG tablet; Take 1 tablet (20 mg total) by mouth two (2) times a day.    Class 3 severe obesity due to excess calories with serious comorbidity and body mass index (BMI) of 60.0 to 69.9 in adult (CMS-HCC)    Essential hypertension  BP near goal (128/82 in clinic today upon recheck).   Continue propranolol 120 mg daily and losartan 25 mg daily. Recommended patient begin a statin, patient agreeable. Trial atorvastatin 20 mg daily. Reviewed low sodium diet and encouraged regular exercise. Advised to continue to monitor and log at-home BP readings. Contact clinic if readings become elevated.     Acquired hypothyroidism  Last TSH 2.690 01/15/22. Continue levothyroxine 50 mcg daily   Recheck TSH at next visit. Will titrate medication as necessary.    Mild intermittent asthma without complication    OSA on CPAP  Patient using CPAP as ordered and benefits from use.   Continue CPAP machine nightly.    Elevated LDL cholesterol level  Trial atorvastatin 20 mg daily and low cholesterol diet.   - atorvastatin (LIPITOR) 20 MG tablet; Take 1 tablet (20 mg total) by mouth daily.     Anxiety and Depression  Stable on current medication. No HI or SI. Continue Wellbutrin 150 mg QAM, hydroxyzine 25 mg TID prn, duloxetine 60 mg BID, and alprazolam 0.5 mg BID prn.    Asthma  Asthma stable. Continue daily maintenance with Symbicort and rescue inhaler, albuterol, as needed.         I personally spent 45 minutes face-to-face and non-face-to-face in the care of this patient, which includes all pre, intra, and post visit time on the date of service.    Return in about 3 months (around 02/26/2023) for Next scheduled follow up.    HPI:      Logan Quinn is here for   Chief Complaint   Patient presents with    Diabetes     He will schedule his diabetic eye exam with [facility].    Hypertension    Hypothyroidism    Anxiety    Depression     Logan Quinn has Diabetes type 2, uncontrolled; Lesion of radial nerve; GERD (  gastroesophageal reflux disease) (RAF-HCC); Depression (RAF-HCC); Suicidal ideation; Abdominal pain; Headache; NAFLD (nonalcoholic fatty liver disease); Bleeding external hemorrhoids; Anxiety; Diarrhea; Obesity; Type 2 diabetes mellitus with hyperglycemia (CMS-HCC); Left shoulder pain; Trapezius muscle strain; Primary insomnia; Chronic joint pain; Cellulitis of right lower extremity; No diabetic retinopathy in both eyes; Congenital hypertrophy of retinal pigment epithelium; H/O retinal detachment; IBS (irritable bowel syndrome); Urinary incontinence, nocturnal enuresis; Severe obstructive sleep apnea; OSA on CPAP; Hidradenitis suppurativa; Acute non-recurrent pansinusitis; Acquired hypothyroidism; Essential hypertension; Mild intermittent asthma without complication; Chronic pansinusitis; Environmental and seasonal allergies; Erectile dysfunction; Sinus congestion; Nasal congestion; and Cough variant asthma on their problem list.     Patient reports his mother has recently had several mini strokes and he would like his family history updated.     Anxiety/Depression: Patient presents for follow-up of depression. PHQ9 score goal  <10.  Depression has customarily been at goal. Symptoms have been stable. Current stressors: none identified today. Current treatment includes: medication: duloxetine, bupropion, hydroxyzine, and alprazolam.     Diabetes: Patient presents for follow up of diabetes.  A1C goal is <8.  Diabetes has customarily not been at goal (complicated by: Obesity, physical inactivity).  Current symptoms include: hyperglycemia and increase appetite. Symptoms have stabilized. Patient denies hypoglycemia  and weight loss. Evaluation to date has included: fasting lipid panel and hemoglobin A1C.  Home sugars: BGs are averaging 231. Current treatment: Continued glipizide, insulin glargine, insulin lispro via omnipod, and dulaglutide  which has been somewhat effective.  Doing regular exercise: no. He is following the mediterranean diet as close as he can.     Hypertension: Patient presents for follow-up of hypertension. Blood pressure goal < 140/90.  Hypertension has customarily been at goal complicated by DM, obesity.  Home blood pressure readings: did not bring log. Medication compliance: taking propranolol 120 mg daily and losartan 25 mg daily as prescribed. He is not doing regular exercise.      Hypothyroid: Patient presents for follow-up of hypothyroidism. Current symptoms: weight changes. Symptoms have stabilized and been well-controlled. He is taking medications on a regular basis. Current therapy includes: levothyroxine 50 mcg daily. OSA: currently using CPAP machine 6-7 hours per night, every night.      He has a current medication list which includes the following prescription(s): acetaminophen, humira(cf) pen, albuterol, albuterol, alprazolam, ascorbic acid, accu-chek guide test strips, budesonide-formoterol, chlorhexidine, clindamycin, clindamycin, clobetasol, clobetasol, colchicine, cyclobenzaprine, dextroamphetamine sulfate, dicyclomine, diphenoxylate-atropine, dulaglutide, duloxetine, empty container, aimovig autoinjector, famotidine, fluoride (sodium), glipizide, humalog u-100 insulin, hydrochlorothiazide, hydrocortisone, hydroxyzine, ibuprofen, insulin glargine, omnipod 5 g6 pods (gen 5), insulin syringe-needle u-100, ketoconazole, lancets, levothyroxine, lidocaine, multivitamin, nystatin, ondansetron, oxybutynin, pantoprazole, polyethylene glycol, promethazine, propranolol, ramelteon, bd safetyglide needle, safety needles, tart cherry extract, tadalafil, testosterone cypionate, topiramate, wellbutrin xl, blood-glucose meter, diclofenac, fluticasone propionate, and montelukast.     ROS:      Comprehensive 10 point ROS negative unless otherwise stated in the HPI.      [facility] Components:     Medication adherence and barriers to the treatment plan have been addressed. Opportunities to optimize healthy behaviors have been discussed. Patient / caregiver voiced understanding.    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)     Obstructive sleep apnea     OSA on CPAP     Retinal detachment 04/2014    Severe obstructive sleep apnea     Trapezius muscle strain 12/07/2013    Urinary incontinence, nocturnal enuresis     Venous insufficiency      Past Surgical History:   Procedure Laterality Date    EYE SURGERY  04/2014    LASER ABLATION INCOMPETENT VEIN INI [facility] HISTORICAL RESULT) Right     PR COLONOSCOPY FLX DX W/COLLJ SPEC WHEN PFRMD N/A 03/24/2013    Procedure: COLONOSCOPY, FLEXIBLE, PROXIMAL TO SPLENIC FLEXURE; DIAGNOSTIC, W/WO COLLECTION SPECIMEN BY BRUSH OR WASH;  Surgeon: Clint Bolder, MD;  Location: [facility];  Service: Gastroenterology    PR EYE SURG POST SGMT PROC UNLISTED Left     pneumatic retinopexy OS    PR UPPER GI ENDOSCOPY,DIAGNOSIS N/A 02/02/2013    Procedure: UGI ENDO, INCLUDE ESOPHAGUS, STOMACH, & DUODENUM &/OR JEJUNUM; DX W/WO COLLECTION SPECIMN, BY BRUSH OR WASH;  Surgeon: Malcolm Metro, MD;  Location: [facility];  Service: Gastroenterology    [place] PYLORIC STENOSIS ([place] HISTORICAL RESULT)         Family History:     Family History   Problem Relation Age of Onset    Cancer Maternal Grandfather         Stomach Cancer    Hearing loss Maternal Grandfather     Cancer Paternal Grandfather         Bone Cancer, Lung Cancer    COPD Paternal Grandmother     Arthritis Paternal Grandmother     Depression Paternal Grandmother     Diabetes Mother     Heart disease Mother     Migraines Mother     Arthritis Mother     Depression Mother     GER disease Mother     Hypertension Mother     Angina Mother     COPD Mother     Glaucoma Mother     Hearing loss Mother     Cataracts Mother     Diabetes Sister     Migraines Sister     Asthma Sister     Depression Sister     Hearing loss Sister     Diabetes Brother     Asthma Brother     Diabetes Maternal Grandmother     Heart disease Maternal Grandmother     Migraines Maternal Grandmother     Depression Maternal Grandmother     Angina Maternal Grandmother     Hypertension Maternal Grandmother     Stroke Maternal Grandmother     Diabetes Maternal Uncle     Hearing loss Maternal Uncle     Diabetes Maternal Uncle         resulted in need for kidney transplant    Liver disease Maternal Uncle     Kidney disease Maternal Uncle         needed kidney transplant    Asthma Brother     No Known Problems Father     No  Known Problems Paternal Aunt     No Known Problems Paternal Uncle     No Known Problems Other     Colorectal Cancer Neg Hx     Esophageal cancer Neg Hx     Liver cancer Neg Hx     Pancreatic cancer Neg Hx     Stomach cancer Neg Hx     Amblyopia Neg Hx     Blindness Neg Hx     Retinal detachment Neg Hx     Strabismus Neg Hx     Macular degeneration Neg Hx     Anesthesia problems Neg Hx     Broken bones Neg Hx     Clotting disorder Neg Hx     Collagen disease Neg Hx     Dislocations Neg Hx     Fibromyalgia Neg Hx     Gout Neg Hx     Hemophilia Neg Hx     Osteoporosis Neg Hx     Rheumatologic disease Neg Hx     Scoliosis Neg Hx     Severe sprains Neg Hx     Sickle cell anemia Neg Hx     Spinal Compression Fracture Neg Hx     Melanoma Neg Hx     Basal cell carcinoma Neg Hx     Squamous cell carcinoma Neg Hx     Deep vein thrombosis Neg Hx     Thyroid disease Neg Hx        Social History:     Social History     Tobacco Use    Smoking status: Never     Passive exposure: Never    Smokeless tobacco: Never   Vaping Use    Vaping status: Never Used   Substance Use Topics    Alcohol use: Never     Comment: rare social    Drug use: Never       Allergies:     Erythromycin, Penicillins, Sulfa (sulfonamide antibiotics), and Other    Current Medications:     Current Outpatient Medications   Medication Sig Dispense Refill    adalimumab (HUMIRA,CF, PEN) 80 mg/0.8 mL PnKt Inject 80 mg subcutaneously once every week 4 each 11    albuterol HFA 90 mcg/actuation inhaler Inhale 2 puffs every six (6) hours as needed for wheezing. 8.5 g 5    allopurinol (ZYLOPRIM) 100 MG tablet Take 2 tablets (200 mg total) by mouth daily. 180 tablet 1    ALPRAZolam (XANAX) 0.5 MG tablet Takes 1 tablet nightly and 1 tablet a day when needed      anastrozole (ARIMIDEX) 1 mg tablet Take 1 tablet (1 mg total) by mouth every other day. 45 tablet 3    ascorbic acid (VITAMIN C ORAL) Take 1 capsule by mouth nightly.      BD LUER-LOK SYRINGE 3 mL 21 gauge x 1 1/2 Syrg weekly      blood-glucose meter kit Use as directed 1 each 0    budesonide-formoterol (SYMBICORT) 80-4.5 mcg/actuation inhaler Inhale 2 puffs two (2) times a day. 10.2 g 11    chlorhexidine (PERIDEX) 0.12 % solution 15 mL by Oromucosal route two (2) times a day. 900 mL 3    clindamycin (CLEOCIN T) 1 % lotion Apply topically two (2) times a day. To affected areas on body as needed for flares 60 mL 3    clobetasoL (TEMOVATE) 0.05 % ointment Apply daily to painful affected areas if needed for flares, then stop 15 g 1  colchicine (MITIGARE) 0.6 mg cap capsule Take 1 capsule (0.6 mg total) by mouth daily. 90 capsule 0    cyclobenzaprine (FLEXERIL) 10 MG tablet Take 1 tablet (10 mg total) by mouth Three (3) times a day as needed for muscle spasms. 60 tablet 1    dextroamphetamine sulfate (DEXTROSTAT) 10 MG tablet Take 2 tablets (20 mg total) by mouth two (2) times a day.      dicyclomine (BENTYL) 10 mg capsule Take 1 capsule (10 mg total) by mouth Four (4) times a day (before meals and nightly). 120 capsule 3    diphenoxylate-atropine (LOMOTIL) 2.5-0.025 mg per tablet Take 1 tablet by mouth four (4) times a day as needed for diarrhea. 30 tablet 1    DULoxetine (CYMBALTA) 60 MG capsule Take 1 capsule (60 mg total) by mouth Two (2) times a day. 180 capsule 0    empty container Misc Use as directed to dispose of needles. When full, make sure lid is closed tightly then dispose of container in trash. 1 each 2    erenumab-aooe (AIMOVIG AUTOINJECTOR) 140 mg/mL AtIn Inject 1 Pen under the skin every thirty (30) days. 3 mL 1    fluocinonide (LIDEX) 0.05 % gel Apply 1 Application topically Three (3) times a day.      fluoride, sodium, 1.1 % Pste       fluticasone propionate (FLONASE) 50 mcg/actuation nasal spray 1 spray into each nostril two (2) times a day. 16 g 5    glipiZIDE (GLUCOTROL) 10 MG tablet Take 2 tablets (20 mg total) by mouth Two (2) times a day (30 minutes before a meal). 360 tablet 3    HUMALOG U-100 INSULIN 100 unit/mL injection INJECT 120 UNITS UNDER THE SKIN DAILY VIA OMNIPOD 40 mL 5    hydrocortisone (PROCTOSOL HC) 2.5 % rectal cream Insert 1 Application into the rectum two (2) times a day as needed. 28.35 g 2    hydrOXYzine (ATARAX) 25 MG tablet Take 1 tablet (25 mg total) by mouth Three (3) times a day as needed. May take two before bed for sleep. 120 tablet 1    ibuprofen (ADVIL,MOTRIN) 800 MG tablet Take 1 tablet (800 mg total) by mouth every eight (8) hours as needed. 90 tablet 1    insulin glargine (LANTUS U-100 INSULIN) 100 unit/mL injection Inject 0.05 mL (5 Units total) under the skin nightly. 10 mL 1    insulin pump cart,automated,BT (OMNIPOD 5 G6 PODS, GEN 5,) Crtg Change POD every 48 hours. 3 each 11    insulin syringe-needle U-100 1 mL 31 gauge x 5/16 (8 mm) Syrg Once daily for insulin dosing. 100 each 1    ketoconazole (NIZORAL) 2 % cream Apply 1 Application topically daily. To affected area on body until clear 60 g 3    levothyroxine (SYNTHROID) 50 MCG tablet Take 1 tablet (50 mcg total) by mouth daily. 90 tablet 1    lidocaine (XYLOCAINE) 5 % ointment Apply topically Three (3) times a day as needed. 30 g 1    LIDOCAINE 2 % solution Swish and spit three times a day as needed      losartan (COZAAR) 25 MG tablet Take 1 tablet (25 mg total) by mouth daily. 90 tablet 3    montelukast (SINGULAIR) 10 mg tablet Take 1 tablet (10 mg total) by mouth daily. 90 tablet 3    multivitamin (MULTIPLE VITAMIN ORAL) Take 1 tablet by mouth daily.      ondansetron (ZOFRAN) 4 MG tablet  Take 1 tablet (4 mg total) by mouth every eight (8) hours as needed for nausea. 30 tablet 1    oxybutynin (DITROPAN) 5 MG tablet Take 1 tablet (5 mg total) by mouth Two (2) times a day. 180 tablet 1    pantoprazole (PROTONIX) 40 MG tablet Take 1 tablet (40 mg total) by mouth two (2) times a day. 180 tablet 1    polyethylene glycol (GLYCOLAX) 17 gram/dose powder       promethazine (PHENERGAN) 25 MG tablet Take 1 tablet (25 mg total) by mouth every eight (8) hours as needed for nausea. 30 tablet 1    propranoloL (INDERAL LA) 120 mg 24 hr capsule Take 1 capsule (120 mg total) by mouth daily. 90 capsule 1    ramelteon (ROZEREM) 8 mg tablet Take 1 tablet (8 mg total) by mouth nightly.      safety needles (BD SAFETYGLIDE NEEDLE) 23 gauge x 1 Ndle 1 Units by Miscellaneous route once a week. 12 each 11    safety needles 18 gauge x 1 1/2 Ndle 1 Units by Miscellaneous route once a week. 12 each 11    sour cherry extract (TART CHERRY EXTRACT) 1,000 mg cap       tadalafil (CIALIS) 5 MG tablet Take 1 tablet (5 mg total) by mouth in the morning. 90 tablet 3    testosterone cypionate (DEPOTESTOTERONE CYPIONATE) 200 mg/mL injection Inject 0.5 mL (100 mg total) into the muscle once a week. Inject 0.5cc (100mg ) weekly 2 mL 5    WELLBUTRIN XL 150 mg 24 hr tablet Take 1 tablet (150 mg total) by mouth every morning.      albuterol 2.5 mg /3 mL (0.083 %) nebulizer solution Inhale 3 mL (2.5 mg total) by nebulization every four (4) hours as needed for wheezing. (Patient not taking: Reported on 08/21/2022) 180 mL 2    atorvastatin (LIPITOR) 20 MG tablet Take 1 tablet (20 mg total) by mouth daily. 90 tablet 3    famotidine (PEPCID) 20 MG tablet Take 1 tablet (20 mg total) by mouth two (2) times a day. 180 tablet 3    lancets (ACCU-CHEK FASTCLIX LANCET DRUM) Misc Use to check blood sugars 3 times a day before meals or as directed (Patient not taking: Reported on 11/26/2022) 200 each 5    semaglutide 0.25 mg or 0.5 mg (2 mg/3 mL) PnIj Inject 0.25 mg under the skin every seven (7) days. 3 mL 1    topiramate (TOPAMAX) 50 MG tablet Take 1.5 tablets (75 mg total) by mouth two (2) times a day. 270 tablet 3     No current facility-administered medications for this visit.       Health Maintenance:     Health Maintenance   Topic Date Due    Urine Albumin/Creatinine Ratio  09/16/2020    COVID-19 Vaccine (3 - Pfizer risk series) 01/24/2021    Retinal Eye Exam  10/31/2022    Serum Creatinine Monitoring  01/16/2023    Potassium Monitoring  01/16/2023    Foot Exam  03/19/2023    Hemoglobin A1c  05/28/2023    DTaP/Tdap/Td Vaccines (4 - Td or Tdap) 07/17/2023    COPD Spirometry  08/06/2026    Lipid Screening  01/16/2027    Pneumococcal Vaccine 0-64  Completed    Hepatitis C Screen  Completed    Influenza Vaccine  Completed       Immunizations:     Immunization History   Administered Date(s) Administered  COVID-19 VACC,MRNA,(PFIZER)(PF) 12/04/2020, 12/27/2020    HEPATITIS B VACCINE ADULT,IM(ENERGIX B, RECOMBIVAX) 03/05/2013, 04/05/2013, 09/03/2013    Hepatitis B Vaccine, Unspecified Formulation 12/27/1999, 04/29/2000    INFLUENZA INJ MDCK PF, QUAD,(FLUCELVAX)(21MO AND UP EGG FREE) 03/31/2019    INFLUENZA TIV (TRI) PF (IM) 10/08/2011    Influenza Vaccine Quad(IM)6 MO-Adult(PF) 03/05/2013, 03/18/2014, 03/22/2015, 03/05/2016, 03/19/2017, 02/23/2018, 02/19/2022    Influenza Virus Vaccine, unspecified formulation 03/19/2017, 02/23/2018, 03/25/2019, 03/10/2020, 04/05/2021    PNEUMOCOCCAL POLYSACCHARIDE 23-VALENT 12/30/2012    PPD Test 10/28/2016    Pneumococcal Conjugate 20-valent 01/02/2022    TD(TDVAX),ADSORBED,2LF(IM)(PF) 04/29/2000    TdaP 07/18/2008, 07/16/2013     I have reviewed and (if needed) updated the patient's problem list, medications, allergies, past medical and surgical history, social and family history.     Vital Signs:     Wt Readings from Last 3 Encounters:   11/26/22 (!) 208.2 kg (459 lb)   10/14/22 (!) 213.6 kg (471 lb)   08/21/22 (!) 214.6 kg (473 lb)     Temp Readings from Last 3 Encounters:   11/26/22 36.7 ??C (98.1 ??F) (Oral)   10/14/22 36.4 ??C (97.6 ??F) (Temporal)   08/21/22 37.2 ??C (98.9 ??F) (Oral)     BP Readings from Last 3 Encounters:   11/26/22 128/82   10/14/22 123/78   08/21/22 154/84     Pulse Readings from Last 3 Encounters:   11/26/22 71   10/14/22 69   08/21/22 93     Estimated body mass index is 64.05 kg/m?? as calculated from the following:    Height as of this encounter: 180.3 cm (5' 10.98).    Weight as of this encounter: 208.2 kg (459 lb).  Facility age limit for growth %iles is 20 years.      Objective:      General: Alert and oriented x3. Well-appearing. No acute distress. Morbidly Obese BMI > 64  HEENT:  Normocephalic.  Atraumatic. Conjunctiva and sclera normal.   Neck:  Supple.   Heart:  Regular rate and rhythm. Normal S1, S2. No murmurs, rubs or gallops.  Lungs:  No respiratory distress.  Lungs clear to auscultation. No wheezes, rhonchi, or rales.   Skin:  Warm, dry. No rash or lesions present on visible skin.  Neuro:  Non-focal. No obvious weakness.   Psych:  Affect normal, eye contact good, speech clear and coherent.        I attest that I, Arta Bruce, personally documented this note while acting as scribe for [name], FNP.      Arta Bruce, Scribe.  11/26/2022     The documentation recorded by the scribe accurately reflects the service I personally performed and the decisions made by me.     [name], FNP

## 2022-11-15 DIAGNOSIS — E1165 Type 2 diabetes mellitus with hyperglycemia: Principal | ICD-10-CM

## 2022-11-15 DIAGNOSIS — Z794 Long term (current) use of insulin: Principal | ICD-10-CM

## 2022-11-15 DIAGNOSIS — Z6841 Body Mass Index (BMI) 40.0 and over, adult: Principal | ICD-10-CM

## 2022-11-15 MED ORDER — TRULICITY 4.5 MG/0.5 ML SUBCUTANEOUS PEN INJECTOR
SUBCUTANEOUS | 2 refills | 0 days | Status: CP
Start: 2022-11-15 — End: 2023-11-15

## 2022-11-20 NOTE — Unmapped (Signed)
National Pharmacologist.  Called multiple pharmacies and can't find any.  Wanting to know what he should do.

## 2022-11-20 NOTE — Unmapped (Signed)
[facility] Refill Coordination Note    Specialty Medication(s) to be Shipped:   Inflammatory Disorders: Humira    Other medication(s) to be shipped: No additional medications requested for fill at this time     Logan Quinn, DOB: [birth year]  Phone: [phone] (home)       All above HIPAA information was verified with patient.     Was a Nurse, learning disability used for this call? No    Completed refill call assessment today to schedule patient's medication shipment from the [facility] [phone]).  All relevant notes have been reviewed.     Specialty medication(s) and dose(s) confirmed: Regimen is correct and unchanged.   Changes to medications: Logan Quinn reports no changes at this time.  Changes to insurance: No  New side effects reported not previously addressed with a pharmacist or physician: None reported  Questions for the pharmacist: No    Confirmed patient received a Conservation officer, historic buildings and a Surveyor, mining with first shipment. The patient will receive a drug information handout for each medication shipped and additional FDA Medication Guides as required.       DISEASE/MEDICATION-SPECIFIC INFORMATION        For patients on injectable medications: Patient currently has 1 doses left.  Next injection is scheduled for 6/13.    SPECIALTY MEDICATION ADHERENCE     Medication Adherence    Patient reported X missed doses in the last month: 0  Specialty Medication: HUMIRA(CF) PEN 80 mg/0.8 mL Pnkt (adalimumab)  Patient is on additional specialty medications: No  Informant: patient              Were doses missed due to medication being on hold? No    Humira 80/0.8 mg/ml: 1 days of medicine on hand       REFERRAL TO PHARMACIST     Referral to the pharmacist: Not needed      [facility]     Shipping address confirmed in Epic.     Patient was notified of new phone menu : Yes    Delivery Scheduled: Yes, Expected medication delivery date: 6/14.     Medication will be delivered via Same Day Courier to the prescription address in Epic WAM.    [name]   [facility] Specialty Technician

## 2022-11-21 NOTE — Unmapped (Signed)
Finally found a pharmacy that had Trulicity but would still like RX for Ozempic sent in to [facility].  His insurance told him it would need a PA.  He feels Trulicity hasn't helped with his diabetes and he sent his blood sugar readings.

## 2022-11-21 NOTE — Unmapped (Signed)
[employer] data reviewed. BS running a little higher than previously.   Logan Quinn

## 2022-11-22 MED FILL — HUMIRA(CF) PEN 80 MG/0.8 ML SUBCUTANEOUS KIT: 28 days supply | Qty: 4 | Fill #4

## 2022-11-25 ENCOUNTER — Ambulatory Visit (INDEPENDENT_AMBULATORY_CARE_PROVIDER_SITE_OTHER): Payer: 59 | Admitting: Podiatry

## 2022-11-25 DIAGNOSIS — E1142 Type 2 diabetes mellitus with diabetic polyneuropathy: Secondary | ICD-10-CM | POA: Diagnosis not present

## 2022-11-25 DIAGNOSIS — M79676 Pain in unspecified toe(s): Secondary | ICD-10-CM | POA: Diagnosis not present

## 2022-11-25 DIAGNOSIS — B351 Tinea unguium: Secondary | ICD-10-CM | POA: Diagnosis not present

## 2022-11-26 ENCOUNTER — Ambulatory Visit: Admit: 2022-11-26 | Discharge: 2022-11-27 | Payer: MEDICARE | Attending: Family | Primary: Family

## 2022-11-26 DIAGNOSIS — E039 Hypothyroidism, unspecified: Principal | ICD-10-CM

## 2022-11-26 DIAGNOSIS — K219 Gastro-esophageal reflux disease without esophagitis: Principal | ICD-10-CM

## 2022-11-26 DIAGNOSIS — G43909 Migraine, unspecified, not intractable, without status migrainosus: Principal | ICD-10-CM

## 2022-11-26 DIAGNOSIS — E7800 Pure hypercholesterolemia, unspecified: Principal | ICD-10-CM

## 2022-11-26 DIAGNOSIS — E1165 Type 2 diabetes mellitus with hyperglycemia: Principal | ICD-10-CM

## 2022-11-26 DIAGNOSIS — J4520 Mild intermittent asthma, uncomplicated: Principal | ICD-10-CM

## 2022-11-26 DIAGNOSIS — G4733 Obstructive sleep apnea (adult) (pediatric): Principal | ICD-10-CM

## 2022-11-26 DIAGNOSIS — Z6841 Body Mass Index (BMI) 40.0 and over, adult: Principal | ICD-10-CM

## 2022-11-26 DIAGNOSIS — I10 Essential (primary) hypertension: Principal | ICD-10-CM

## 2022-11-26 DIAGNOSIS — Z794 Long term (current) use of insulin: Principal | ICD-10-CM

## 2022-11-26 LAB — ALBUMIN / CREATININE URINE RATIO
ALBUMIN QUANT URINE: 1.2 mg/dL
ALBUMIN/CREATININE RATIO: 5.4 ug/mg (ref 0.0–30.0)
CREATININE, URINE: 223.8 mg/dL

## 2022-11-26 MED ORDER — FAMOTIDINE 20 MG TABLET
ORAL_TABLET | Freq: Two times a day (BID) | ORAL | 3 refills | 90 days | Status: CP
Start: 2022-11-26 — End: 2023-11-26

## 2022-11-26 MED ORDER — SEMAGLUTIDE 0.25 MG OR 0.5 MG (2 MG/3 ML) SUBCUTANEOUS PEN INJECTOR
SUBCUTANEOUS | 1 refills | 56 days | Status: CP
Start: 2022-11-26 — End: ?

## 2022-11-26 MED ORDER — ATORVASTATIN 20 MG TABLET
ORAL_TABLET | Freq: Every day | ORAL | 3 refills | 90 days | Status: CP
Start: 2022-11-26 — End: 2023-11-26

## 2022-11-26 MED ORDER — TOPIRAMATE 50 MG TABLET
ORAL_TABLET | Freq: Two times a day (BID) | ORAL | 3 refills | 90 days | Status: CP
Start: 2022-11-26 — End: 2023-11-26

## 2022-11-26 NOTE — Unmapped (Signed)
Addressed and ordered during clinic visit.

## 2022-11-28 ENCOUNTER — Encounter: Payer: Self-pay | Admitting: Podiatry

## 2022-11-28 NOTE — Progress Notes (Signed)
  Subjective:  Patient ID: Jason Davidson, male    DOB: March 10, 1981,  MRN: [id number]  Jason Davidson presents to clinic today for: painful elongated mycotic toenails 1-5 bilaterally which are tender when wearing enclosed shoe gear. Pain is relieved with periodic professional debridement.  Chief Complaint  Patient presents with   Nail Problem    DFC,Referring Provider Etheleen Nicks, NP,lov:02/24,office visit scheduled 06/24,A1C:7.0,BS:183       PCP is Etheleen Nicks, NP.  Allergies  Allergen Reactions   Erythromycin Nausea And Vomiting   Penicillins Swelling   Sulfa Antibiotics Nausea And Vomiting    Review of Systems: Negative except as noted in the HPI.  Objective: No changes noted in today's physical examination. There were no vitals filed for this visit.  Jason Davidson is a pleasant 42 y.o. male in NAD. AAO x 3.  Vascular Examination: Capillary refill time <3 seconds b/l LE. Palpable pedal pulses b/l LE. Digital hair sparse b/l. No pedal edema b/l. Skin temperature gradient WNL b/l. No varicosities b/l. Marland Kitchen  Dermatological Examination: Pedal skin with normal turgor, texture and tone b/l. No open wounds. No interdigital macerations b/l. Toenails 1-5 b/l thickened, discolored, dystrophic with subungual debris. There is pain on palpation to dorsal aspect of nailplates.  Left hallux nail with longitudinal split. No erythema, no edema, no drainage, no fluctuance.  Neurological Examination: Protective sensation intact with 10 gram monofilament b/l LE. Vibratory sensation intact b/l LE. Pt has subjective symptoms of neuropathy.   Musculoskeletal Examination: Normal muscle strength 5/5 to all lower extremity muscle groups bilaterally. No pain, crepitus or joint limitation noted with ROM b/l LE. No gross bony pedal deformities b/l. Patient ambulates independently without assistive aids.  Assessment/Plan: 1. Pain due to onychomycosis of toenail   2. Diabetic  polyneuropathy associated with type 2 diabetes mellitus (HCC)     -Patient was evaluated and treated. All patient's and/or POA's questions/concerns answered on today's visit. -Continue foot and shoe inspections daily. Monitor blood glucose per PCP/Endocrinologist's recommendations. -Patient to continue soft, supportive shoe gear daily. -Toenails 1-5 bilaterally were debrided in length and girth with sterile nail nippers and dremel. Pinpoint bleeding of R 4th toe addressed with Lumicain Hemostatic Solution, cleansed with alcohol. Triple antibiotic ointment applied. No further treatment required by patient/caregiver. -Patient/POA to call should there be question/concern in the interim.   Return in about 3 months (around 02/25/2023).  Freddie Breech, DPM

## 2022-12-05 NOTE — Unmapped (Signed)
PA OZEMPIC initiated electronically via Cover My Meds

## 2022-12-05 NOTE — Unmapped (Signed)
Patient in for clinic visit 11/26/22. Did not bring up or discuss need for diflucan.

## 2022-12-06 NOTE — Unmapped (Signed)
PA OZEMPIC APPROVED

## 2022-12-13 MED ORDER — ONDANSETRON HCL 4 MG TABLET
ORAL_TABLET | Freq: Three times a day (TID) | ORAL | 1 refills | 10 days | Status: CP | PRN
Start: 2022-12-13 — End: ?

## 2022-12-13 NOTE — Unmapped (Signed)
Patient is requesting the following refill  Requested Prescriptions     Pending Prescriptions Disp Refills    ondansetron (ZOFRAN) 4 MG tablet 30 tablet 1     Sig: Take 1 tablet (4 mg total) by mouth every eight (8) hours as needed for nausea.       Recent Visits  Date Type Provider Dept   11/26/22 Office Visit Loran Senters, FNP [facility] [address] [facility]   08/21/22 Office Visit Loran Senters, FNP [place] [facility] [address] [facility]   07/31/22 Office Visit Loran Senters, FNP [facility] [address] [facility]   07/02/22 Office Visit Loran Senters, FNP [facility] [address] [facility]   05/21/22 Office Visit Loran Senters, FNP [facility] [address] [facility]   02/27/22 Office Visit Nile Dear, Emogene Morgan, NP [facility] [address] [facility]   02/19/22 Office Visit Loran Senters, FNP [facility] [facility] [address] [facility]   01/02/22 Office Visit Loran Senters, FNP [facility] [address] [facility]   Showing recent visits within past 365 days and meeting all other requirements  Future Appointments  Date Type Provider Dept   02/27/23 Appointment Loran Senters, FNP [facility] [place] [facility] [address] [facility]   Showing future appointments within next 365 days and meeting all other requirements

## 2022-12-18 NOTE — Unmapped (Signed)
Reviewed CGM data.

## 2022-12-23 NOTE — Unmapped (Signed)
When blood sugars get out of whack he gets yeast infection.  Redness, itching, white discharge on penis and folds of his legs x 2 days.  [employer].

## 2022-12-24 MED ORDER — FLUCONAZOLE 150 MG TABLET
ORAL_TABLET | Freq: Once | ORAL | 0 refills | 2 days | Status: CP
Start: 2022-12-24 — End: 2022-12-24

## 2022-12-25 DIAGNOSIS — E1165 Type 2 diabetes mellitus with hyperglycemia: Principal | ICD-10-CM

## 2022-12-25 DIAGNOSIS — Z794 Long term (current) use of insulin: Principal | ICD-10-CM

## 2022-12-25 DIAGNOSIS — Z6841 Body Mass Index (BMI) 40.0 and over, adult: Principal | ICD-10-CM

## 2022-12-25 MED ORDER — OZEMPIC 0.25 MG OR 0.5 MG (2 MG/3 ML) SUBCUTANEOUS PEN INJECTOR
SUBCUTANEOUS | 2 refills | 28 days | Status: CN
Start: 2022-12-25 — End: ?

## 2022-12-25 MED ORDER — SEMAGLUTIDE 0.25 MG OR 0.5 MG (2 MG/3 ML) SUBCUTANEOUS PEN INJECTOR
SUBCUTANEOUS | 2 refills | 28 days | Status: CP
Start: 2022-12-25 — End: 2023-12-25

## 2022-12-25 NOTE — Unmapped (Signed)
[facility] Refill Coordination Note    Specialty Medication(s) to be Shipped:   HUMIRA(CF) PEN 80 mg/0.8 mL Pnkt (adalimumab)    Other medication(s) to be shipped: No additional medications requested for fill at this time     Logan Quinn, DOB: 03/15/1981  Phone: There are no phone numbers on file.      All above HIPAA information was verified with patient.     Was a Nurse, learning disability used for this call? No    Completed refill call assessment today to schedule patient's medication shipment from the [facility] [phone]).  All relevant notes have been reviewed.     Specialty medication(s) and dose(s) confirmed: Regimen is correct and unchanged.   Changes to medications: Jacquese reports no changes at this time.  Changes to insurance: No  New side effects reported not previously addressed with a pharmacist or physician: None reported  Questions for the pharmacist: No    Confirmed patient received a Conservation officer, historic buildings and a Surveyor, mining with first shipment. The patient will receive a drug information handout for each medication shipped and additional FDA Medication Guides as required.       DISEASE/MEDICATION-SPECIFIC INFORMATION        For patients on injectable medications: Patient currently has 1 doses left.  Next injection is scheduled for 12/26/2022.    SPECIALTY MEDICATION ADHERENCE     Medication Adherence    Patient reported X missed doses in the last month: 0  Specialty Medication: adalimumab (HUMIRA,CF, PEN) 80 mg/0.8 mL PnKt  Patient is on additional specialty medications: No              Were doses missed due to medication being on hold? No    HUMIRA(CF) PEN 80 mg/0.8 mL Pnkt (adalimumab): 7 days of medicine on hand     REFERRAL TO PHARMACIST     Referral to the pharmacist: Not needed      [facility]     Shipping address confirmed in Epic.       Delivery Scheduled: Yes, Expected medication delivery date: 01/01/2023.     Medication will be delivered via Same Day Courier to the prescription address in Epic WAM.    Logan Quinn   [facility] Specialty Technician

## 2022-12-25 NOTE — Unmapped (Signed)
Addended by: [name] on: 12/25/2022 03:58 PM     Modules accepted: Orders

## 2022-12-25 NOTE — Unmapped (Signed)
Addended by: Barbaraann Boys on: 12/25/2022 02:10 PM     Modules accepted: Orders

## 2022-12-27 ENCOUNTER — Ambulatory Visit: Payer: MEDICARE

## 2022-12-27 DIAGNOSIS — L304 Erythema intertrigo: Principal | ICD-10-CM

## 2022-12-27 DIAGNOSIS — Z79899 Other long term (current) drug therapy: Principal | ICD-10-CM

## 2022-12-27 DIAGNOSIS — L732 Hidradenitis suppurativa: Principal | ICD-10-CM

## 2022-12-27 MED ORDER — CEPHALEXIN 500 MG CAPSULE
ORAL_CAPSULE | Freq: Four times a day (QID) | ORAL | 0 refills | 10.00000 days | Status: CP
Start: 2022-12-27 — End: 2023-01-10

## 2022-12-27 MED ADMIN — triamcinolone acetonide (KENALOG-40) injection 40 mg: 40 mg | INTRA_ARTICULAR | @ 19:00:00 | Stop: 2022-12-27 | NDC 81298578503

## 2022-12-27 NOTE — Unmapped (Signed)
Dermatology Note     Assessment and Plan:      Hidradenitis suppurativa, Hurley stage II, chronic, flaring- Diagnosis, treatment options, prognosis, risk/ benefit, and side effects of treatment were discussed with the patient.   - s/p unroofing of right scrotum 2020  - Doing well on Humira 80 mg weekly with flares every few months and they are manageable. Jointly agreed to continue medication as below  - Continue humira 80mg  weekly   - Continue clindamycin lotion topically nightly to the flaring lesions  - For current flare, intra-lesional kenalog as below  - If no improvement from intra-lesional kenalog, start cephalexin (KEFLEX) 500 MG capsule; Take 1 capsule (500 mg total) by mouth four (4) times a day for 14 days (Rx given)  - Follow up 02/25 for HS or sooner if needed     Intralesional Kenalog Procedure Note: After the patient was informed of risks (including atrophy and dyspigmentation), benefits and side effects of intralesional steroid injection, the patient elected to undergo injection and verbal consent was obtained. Skin was cleaned with alcohol and injected intralesionally into the sites (below). The patient tolerated the procedure well without complications and was instructed on post-procedure care.  Location(s): 1  Number of sites treated: suprapubic pannus  Kenalog (triamcinolone) Concentration: 40 mg/ml   Volume: 0.4 ml total     High Risk Medication Use (Humira)  - Quant gold neg 07/2022.   - Hep serologies acceptable from 2019     Intertrigo - chronic, stable  - Diagnosis, treatment options, prognosis, risk/ benefit, and side effects of treatment were discussed with the patient.   - Ketoconazole has worked well in the past. If flaring and not controlled with ketoconazole, could switch to nystatin.   - Continue ketoconazole cream- apply to the affected area of the groin daily until clear  - Recommend using zinc oxide barrier paste daily to prevent irritation    The patient was advised to call for an appointment should any new, changing, or symptomatic lesions develop.     RTC: Return in about 7 months (around 07/30/2023). or sooner as needed   _________________________________________________________________      Chief Complaint     Chief Complaint   Patient presents with    Follow-up     HS Flare in groin       HPI     Logan Quinn is a 42 y.o. male who presents as a returning patient (last seen 07/24/2022) to Dermatology for follow up of hidradenitis suppurativa .     Having hidradenitis suppurativa flare, flare started ~2 weeks ago - reports one inflammatory nodule on suprapubic pannus. Has tried topicals (clindamycin, clobetasol) without relief. Has previously had relief of similar lesions with kenalog injection and keflex (no relief with doxycycline).    The patient denies any other new or changing lesions or areas of concern.     Pertinent Past Medical History     No history of skin cancer    Problem List       Hidradenitis suppurativa - Primary    Relevant Medications    cephalexin (KEFLEX) 500 MG capsule    triamcinolone acetonide (KENALOG-40) injection 40 mg (Completed)       Family History:   Negative for melanoma    Past Medical History, Family History, Social History, Medication List, Allergies, and Problem List were reviewed in the rooming section of Epic.     ROS: Other than symptoms mentioned in the HPI, no fevers, chills,  or other skin complaints    Physical Examination     GENERAL: Well-appearing male in no acute distress, resting comfortably.  NEURO: Alert and oriented, answers questions appropriately  RESP: No increased work of breathing  SKIN (Focal Skin Exam): Per patient request, examination of  abdomen, bilateral lower extremities, and groin was performed was performed  - One inflammatory nodule noted on suprapubic pannus    All areas not commented on are within normal limits or unremarkable      (Approved Template 02/21/2020)

## 2022-12-31 ENCOUNTER — Ambulatory Visit
Admit: 2022-12-31 | Discharge: 2023-01-01 | Payer: MEDICARE | Attending: Physician Assistant | Primary: Physician Assistant

## 2023-01-01 MED FILL — HUMIRA(CF) PEN 80 MG/0.8 ML SUBCUTANEOUS KIT: 28 days supply | Qty: 4 | Fill #5

## 2023-01-01 NOTE — Unmapped (Signed)
Date of Service: 12/31/2022     Patient Name: Logan Quinn       MRN: [id number]       Date of Birth: 01-21-1981  Primary Care Physician: Logan Senters, FNP  Referring Provider: Oneita Quinn, None Per Patient  DME: [facility]    Assessment and Plan:   Impression:   Logan Quinn is a 42 y.o. male with OSA on CPAP.    Severe obstructive sleep apnea on CPAP: Logan Quinn was noted to have risk factors for sleep apnea, including loud snoring, witnessed pauses in his sleep breathing, waking up gasping for air, fragmented and nonrestorative sleep, daytime somnolence, a.m. headaches and a PSG that was completed on 09/17/2010 revealing severe obstructive sleep apnea.  It was recommended that he be treated with CPAP 15 cm H2O, but he was later noncompliant.  He had a repeat split study that was completed on 02/26/2017, revealing severe obstructive sleep apnea with an AHI of 50 /HR.  It was recommended that he be treated with CPAP 16 cm H2O EPR 2.  He received a new CPAP device from [facility] on 09/05/2021.  Unfortunately, there were some issues with the device, and it is being repaired.  He received a loaner device from [employer] several months ago.  He reports improved sleep quality and uses his CPAP nightly.  He has no complaints and is here for his yearly follow-up.    I personally spent 30 minutes face-to-face and non-face-to-face in the care of this patient, which includes all pre, intra, and post visit time on the date of service.      Subjective:   CC: Follow-up    History of Present Illness:    Logan Quinn returns for follow-up with his wife and two children. Logan Quinn was initially referred for evaluation of sleep apnea due to class III obesity, snoring, waking up gasping for air, frequent apneas that were witnessed by his wife, nonrestorative sleep, a.m. headaches, and daytime somnolence. He was initially diagnosed via PSG, which was completed on 09/17/2010 with an AHI of 51.3 /HR. He has been treated with CPAP but became noncompliant and had a split study completed on 02/26/2017, also revealing severe obstructive sleep apnea with an AHI of 50 /HR, and it was recommended that he be treated with CPAP 16 cm H2O EPR 2. He received the [employer] 11 AutoSet from [employer] on 09/05/2021. He had to receive a loaner CPAP device as there were issues with the device that they provided. He continues to benefit from improved sleep quality and daytime energy. He does report that his psychiatrist placed him on ramelteon 8 mg and alprazolam 0.5 mg as a sleep aid. He goes to bed at about 10 PM and falls asleep quickly. He wakes up once or twice a night to urinate and rises for the day between 9 AM and 10 AM. He states that he is not sleepy during the day with the exception of certain circumstances like long car rides as a passenger. He will nap when his son naps, and this may last for an hour in the afternoon. He denies any caffeine, alcohol, tobacco products, over-the-counter sleep aids.    Review of Systems:  A 14-system review was completed and found to be negative except for what is mentioned in the HPI.     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)     Obstructive sleep apnea     OSA on CPAP     Retinal detachment 04/2014    Severe obstructive sleep apnea     Trapezius muscle strain 12/07/2013    Urinary incontinence, nocturnal enuresis     Venous insufficiency        Past Surgical Hx:  Past Surgical History:   Procedure Laterality Date    EYE SURGERY  04/2014    LASER ABLATION INCOMPETENT VEIN INI [facility] HISTORICAL RESULT) Right     PR COLONOSCOPY FLX DX W/COLLJ SPEC WHEN PFRMD N/A 03/24/2013    Procedure: COLONOSCOPY, FLEXIBLE, PROXIMAL TO SPLENIC FLEXURE; DIAGNOSTIC, W/WO COLLECTION SPECIMEN BY BRUSH OR WASH;  Surgeon: Clint Bolder, MD;  Location: GI [facility]URES [facility];  Service: Gastroenterology    PR EYE SURG POST SGMT PROC UNLISTED Left     pneumatic retinopexy OS    PR UPPER GI ENDOSCOPY,DIAGNOSIS N/A 02/02/2013    Procedure: UGI ENDO, INCLUDE ESOPHAGUS, STOMACH, & DUODENUM &/OR JEJUNUM; DX W/WO COLLECTION SPECIMN, BY BRUSH OR WASH;  Surgeon: Malcolm Metro, MD;  Location: [facility];  Service: Gastroenterology    SKIN BIOPSY      [place] PYLORIC STENOSIS ( [place] HISTORICAL RESULT)         FamHx:  Family History   Problem Relation Age of Onset    Cancer Maternal Grandfather         Stomach Cancer    Hearing loss Maternal Grandfather     Cancer Paternal Grandfather         Bone Cancer, Lung Cancer    COPD Paternal Grandmother     Arthritis Paternal Grandmother     Depression Paternal Grandmother     Diabetes Mother     Heart disease Mother     Migraines Mother     Arthritis Mother     Depression Mother     GER disease Mother     Hypertension Mother     Angina Mother     COPD Mother     Glaucoma Mother     Hearing loss Mother     Cataracts Mother     Diabetes Sister     Migraines Sister     Asthma Sister     Depression Sister     Hearing loss Sister     Diabetes Brother     Asthma Brother     Diabetes Maternal Grandmother     Heart disease Maternal Grandmother     Migraines Maternal Grandmother     Depression Maternal Grandmother     Angina Maternal Grandmother     Hypertension Maternal Grandmother     Stroke Maternal Grandmother     Diabetes Maternal Uncle     Hearing loss Maternal Uncle     Diabetes Maternal Uncle         resulted  in need for kidney transplant    Liver disease Maternal Uncle     Kidney disease Maternal Uncle         needed kidney transplant    Asthma Brother     No Known Problems Father     No Known Problems Paternal Aunt     No Known Problems Paternal Uncle     No Known Problems Other     Colorectal Cancer Neg Hx     Esophageal cancer Neg Hx     Liver cancer Neg Hx     Pancreatic cancer Neg Hx     Stomach cancer Neg Hx     Amblyopia Neg Hx     Blindness Neg Hx     Retinal detachment Neg Hx     Strabismus Neg Hx     Macular degeneration Neg Hx     Anesthesia problems Neg Hx     Broken bones Neg Hx     Clotting disorder Neg Hx     Collagen disease Neg Hx     Dislocations Neg Hx     Fibromyalgia Neg Hx     Gout Neg Hx     Hemophilia Neg Hx     Osteoporosis Neg Hx     Rheumatologic disease Neg Hx     Scoliosis Neg Hx     Severe sprains Neg Hx     Sickle cell anemia Neg Hx     Spinal Compression Fracture Neg Hx     Melanoma Neg Hx     Basal cell carcinoma Neg Hx     Squamous cell carcinoma Neg Hx     Deep vein thrombosis Neg Hx     Thyroid disease Neg Hx        Social History:  Social History     Socioeconomic History    Marital status: Married   Tobacco Use    Smoking status: Never     Passive exposure: Never    Smokeless tobacco: Never   Vaping Use    Vaping status: Never Used   Substance and Sexual Activity    Alcohol use: Never     Comment: rare social    Drug use: Never    Sexual activity: Yes     Partners: Female     Birth control/protection: None   Other Topics Concern    Do you use sunscreen? Yes    Tanning bed use? No    Are you easily burned? No    Excessive sun exposure? No    Blistering sunburns? No     Social Determinants of Health     Financial Resource Strain: Low Risk  (07/02/2022)    Overall Financial Resource Strain (CARDIA)     Difficulty of Paying Living Expenses: Not hard at all   Food Insecurity: No Food Insecurity (07/02/2022)    Hunger Vital Sign     Worried About Running Out of Food in the Last Year: Never true     Ran Out of Food in the Last Year: Never true Transportation Needs: No Transportation Needs (07/02/2022)    PRAPARE - Therapist, art (Medical): No     Lack of Transportation (Non-Medical): No    Received from [facility]    Social Network       Medications:    Current Outpatient Medications:     adalimumab (HUMIRA,CF, PEN) 80 mg/0.8 mL PnKt, Inject 80 mg subcutaneously once every week, Disp:  4 each, Rfl: 11    albuterol HFA 90 mcg/actuation inhaler, Inhale 2 puffs every six (6) hours as needed for wheezing., Disp: 8.5 g, Rfl: 5    allopurinol (ZYLOPRIM) 100 MG tablet, Take 2 tablets (200 mg total) by mouth daily., Disp: 180 tablet, Rfl: 1    ALPRAZolam (XANAX) 0.5 MG tablet, Takes 1 tablet nightly and 1 tablet a day when needed, Disp: , Rfl:     anastrozole (ARIMIDEX) 1 mg tablet, Take 1 tablet (1 mg total) by mouth every other day., Disp: 45 tablet, Rfl: 3    ascorbic acid (VITAMIN C ORAL), Take 1 capsule by mouth nightly., Disp: , Rfl:     atorvastatin (LIPITOR) 20 MG tablet, Take 1 tablet (20 mg total) by mouth daily., Disp: 90 tablet, Rfl: 3    BD LUER-LOK SYRINGE 3 mL 21 gauge x 1 1/2 Syrg, weekly, Disp: , Rfl:     budesonide-formoterol (SYMBICORT) 80-4.5 mcg/actuation inhaler, Inhale 2 puffs two (2) times a day., Disp: 10.2 g, Rfl: 11    clindamycin (CLEOCIN T) 1 % lotion, Apply topically two (2) times a day. To affected areas on body as needed for flares, Disp: 60 mL, Rfl: 3    clobetasoL (TEMOVATE) 0.05 % ointment, Apply daily to painful affected areas if needed for flares, then stop, Disp: 15 g, Rfl: 1    colchicine (MITIGARE) 0.6 mg cap capsule, Take 1 capsule (0.6 mg total) by mouth daily., Disp: 90 capsule, Rfl: 0    cyclobenzaprine (FLEXERIL) 10 MG tablet, Take 1 tablet (10 mg total) by mouth Three (3) times a day as needed for muscle spasms., Disp: 60 tablet, Rfl: 1    dextroamphetamine sulfate (DEXTROSTAT) 10 MG tablet, Take 2 tablets (20 mg total) by mouth two (2) times a day., Disp: , Rfl: dicyclomine (BENTYL) 10 mg capsule, Take 1 capsule (10 mg total) by mouth Four (4) times a day (before meals and nightly)., Disp: 120 capsule, Rfl: 3    diphenoxylate-atropine (LOMOTIL) 2.5-0.025 mg per tablet, Take 1 tablet by mouth four (4) times a day as needed for diarrhea., Disp: 30 tablet, Rfl: 1    DULoxetine (CYMBALTA) 60 MG capsule, Take 1 capsule (60 mg total) by mouth Two (2) times a day., Disp: 180 capsule, Rfl: 0    empty container Misc, Use as directed to dispose of needles. When full, make sure lid is closed tightly then dispose of container in trash., Disp: 1 each, Rfl: 2    erenumab-aooe (AIMOVIG AUTOINJECTOR) 140 mg/mL AtIn, Inject 1 Pen under the skin every thirty (30) days., Disp: 3 mL, Rfl: 1    famotidine (PEPCID) 20 MG tablet, Take 1 tablet (20 mg total) by mouth two (2) times a day., Disp: 180 tablet, Rfl: 3    fluocinonide (LIDEX) 0.05 % gel, Apply 1 Application topically Three (3) times a day., Disp: , Rfl:     fluoride, sodium, 1.1 % Pste, , Disp: , Rfl:     fluticasone propionate (FLONASE) 50 mcg/actuation nasal spray, 1 spray into each nostril two (2) times a day., Disp: 16 g, Rfl: 5    glipiZIDE (GLUCOTROL) 10 MG tablet, Take 2 tablets (20 mg total) by mouth Two (2) times a day (30 minutes before a meal)., Disp: 360 tablet, Rfl: 3    HUMALOG U-100 INSULIN 100 unit/mL injection, INJECT 120 UNITS UNDER THE SKIN DAILY VIA OMNIPOD, Disp: 40 mL, Rfl: 5    hydrocortisone (PROCTOSOL HC) 2.5 % rectal cream, Insert 1 Application  into the rectum two (2) times a day as needed., Disp: 28.35 g, Rfl: 2    hydrOXYzine (ATARAX) 25 MG tablet, Take 1 tablet (25 mg total) by mouth Three (3) times a day as needed. May take two before bed for sleep., Disp: 120 tablet, Rfl: 1    ibuprofen (ADVIL,MOTRIN) 800 MG tablet, Take 1 tablet (800 mg total) by mouth every eight (8) hours as needed., Disp: 90 tablet, Rfl: 1    insulin glargine (LANTUS U-100 INSULIN) 100 unit/mL injection, Inject 0.05 mL (5 Units total) under the skin nightly., Disp: 10 mL, Rfl: 1    insulin pump cart,automated,BT (OMNIPOD 5 G6 PODS, GEN 5,) Crtg, Change POD every 48 hours., Disp: 3 each, Rfl: 11    insulin syringe-needle U-100 0.3 mL 31 gauge x 5/16 (8 mm) Syrg, USE DAILY WITH LANTUS INJECTIONS, Disp: , Rfl:     insulin syringe-needle U-100 1 mL 31 gauge x 5/16 (8 mm) Syrg, Once daily for insulin dosing., Disp: 100 each, Rfl: 1    insulin syringe-needle U-100 1 mL 31 gauge x 5/16 (8 mm) Syrg, USE AS DIRECTED ONE TIME DAILY FOR INSULIN DOSING, Disp: , Rfl:     ketoconazole (NIZORAL) 2 % cream, Apply 1 Application topically daily. To affected area on body until clear, Disp: 60 g, Rfl: 3    lancets (ACCU-CHEK FASTCLIX LANCET DRUM) Misc, Use to check blood sugars 3 times a day before meals or as directed, Disp: 200 each, Rfl: 5    lancets (ACCU-CHEK FASTCLIX LANCET DRUM) Misc, USE TO CHECK BLOOD SUGAR THREE TIMES DAILY BEFORE MEALS OR AS DIRECTED, Disp: , Rfl:     levothyroxine (SYNTHROID) 50 MCG tablet, Take 1 tablet (50 mcg total) by mouth daily., Disp: 90 tablet, Rfl: 1    lidocaine (XYLOCAINE) 5 % ointment, Apply topically Three (3) times a day as needed., Disp: 30 g, Rfl: 1    LIDOCAINE 2 % solution, Swish and spit three times a day as needed, Disp: , Rfl:     losartan (COZAAR) 25 MG tablet, Take 1 tablet (25 mg total) by mouth daily., Disp: 90 tablet, Rfl: 3    montelukast (SINGULAIR) 10 mg tablet, Take 1 tablet (10 mg total) by mouth daily., Disp: 90 tablet, Rfl: 3    multivitamin (MULTIPLE VITAMIN ORAL), Take 1 tablet by mouth daily., Disp: , Rfl:     ondansetron (ZOFRAN) 4 MG tablet, Take 1 tablet (4 mg total) by mouth every eight (8) hours as needed for nausea., Disp: 30 tablet, Rfl: 1    pantoprazole (PROTONIX) 40 MG tablet, Take 1 tablet (40 mg total) by mouth two (2) times a day., Disp: 180 tablet, Rfl: 1    polyethylene glycol (GLYCOLAX) 17 gram/dose powder, , Disp: , Rfl:     promethazine (PHENERGAN) 25 MG tablet, Take 1 tablet (25 mg total) by mouth every eight (8) hours as needed for nausea., Disp: 30 tablet, Rfl: 1    propranoloL (INDERAL LA) 120 mg 24 hr capsule, Take 1 capsule (120 mg total) by mouth daily., Disp: 90 capsule, Rfl: 1    ramelteon (ROZEREM) 8 mg tablet, Take 1 tablet (8 mg total) by mouth nightly., Disp: , Rfl:     safety needles (BD SAFETYGLIDE NEEDLE) 23 gauge x 1 Ndle, 1 Units by Miscellaneous route once a week., Disp: 12 each, Rfl: 11    safety needles 18 gauge x 1 1/2 Ndle, 1 Units by Miscellaneous route once a week., Disp: 12 each, Rfl:  11    semaglutide 0.25 mg or 0.5 mg (2 mg/3 mL) PnIj, Inject 0.5 mg under the skin every seven (7) days., Disp: 3 mL, Rfl: 2    sour cherry extract (TART CHERRY EXTRACT) 1,000 mg cap, , Disp: , Rfl:     tadalafil (CIALIS) 5 MG tablet, Take 1 tablet (5 mg total) by mouth in the morning., Disp: 90 tablet, Rfl: 3    testosterone cypionate (DEPOTESTOTERONE CYPIONATE) 200 mg/mL injection, Inject 0.5 mL (100 mg total) into the muscle once a week. Inject 0.5cc (100mg ) weekly, Disp: 2 mL, Rfl: 5    topiramate (TOPAMAX) 50 MG tablet, Take 1.5 tablets (75 mg total) by mouth two (2) times a day., Disp: 270 tablet, Rfl: 3    WELLBUTRIN XL 150 mg 24 hr tablet, Take 1 tablet (150 mg total) by mouth every morning., Disp: , Rfl:     albuterol 2.5 mg /3 mL (0.083 %) nebulizer solution, Inhale 3 mL (2.5 mg total) by nebulization every four (4) hours as needed for wheezing. (Patient not taking: Reported on 08/21/2022), Disp: 180 mL, Rfl: 2    blood sugar diagnostic (ACCU-CHEK GUIDE TEST STRIPS) Strp, USE TO CHECK BLOOD SUGAR 3 TIMES DAILY BEFORE MEALS, Disp: , Rfl:     blood-glucose meter kit, Use as directed, Disp: 1 each, Rfl: 0    cephalexin (KEFLEX) 500 MG capsule, Take 1 capsule (500 mg total) by mouth four (4) times a day for 14 days., Disp: 40 capsule, Rfl: 0    chlorhexidine (PERIDEX) 0.12 % solution, 15 mL by Oromucosal route two (2) times a day. (Patient not taking: Reported on 12/31/2022), Disp: 900 mL, Rfl: 3    oxybutynin (DITROPAN) 5 MG tablet, Take 1 tablet (5 mg total) by mouth Two (2) times a day., Disp: 180 tablet, Rfl: 1    safety needles (BD SAFETYGLIDE NEEDLE) 18 gauge x 1 1/2 Ndle, USE ONE NEEDLE ONCE WEEKLY, Disp: , Rfl:     syringe with needle (BD LUER-LOK SYRINGE) 3 mL 21 gauge x 1 1/2 Syrg, USE AS DIRECTED ONCE A WEEK, Disp: , Rfl:     Allergies:  Allergies   Allergen Reactions    Erythromycin Nausea And Vomiting     Other reaction(s): Vomiting    Penicillins Rash, Swelling and Hives    Sulfa (Sulfonamide Antibiotics) Nausea And Vomiting and Nausea Only    Other             Objective:     Physical Exam:     Vitals:    12/31/22 1423   BP: 140/77   BP Site: R Arm   BP Position: Sitting   BP Cuff Size: Large   Pulse: 88   Temp: 36.9 ??C (98.5 ??F)   TempSrc: Temporal   SpO2: 94%   Weight: (!) 209.4 kg (461 lb 11.2 oz)   Height: 180.3 cm (5' 11)     Body mass index is 64.39 kg/m??.      Awake alert appropriate  Face symmetric  Clear speech  Follows commands  Moves all extremities spontaneously and antigravity  Normal gait      Epworth Sleepiness Scale  Sitting and reading: 2  Watching television:3  Sitting in a public place:0  Passenger in a car for an hour:2  Lying down in afternoon:3  Sitting quietly after lunch:2  Sitting and speaking with someone:0  Driver, but stopped in traffic for a few minutes:0  Total: 12      Data/ Lab/  Inventory Review:   Polysomnogram results as above  History obtained from patient and his wife  Imaging   Blood/ serum laboratory tests  No results found for: CBC  Ferritin   Date Value Ref Range Status   01/15/2022 125.0 10.5 - 307.3 ng/mL Final     Iron Saturation (%)   Date Value Ref Range Status   01/15/2022 23 % Final     TSH   Date Value Ref Range Status   01/15/2022 2.690 0.550 - 4.780 uIU/mL Final     Free T4   Date Value Ref Range Status   07/08/2018 0.94 0.71 - 1.40 ng/dL Final     Vitamin A-54   Date Value Ref Range Status   01/15/2022 347 211 - 911 pg/ml Final     CO2   Date Value Ref Range Status   01/15/2022 29.9 20.0 - 31.0 mmol/L Final           Author:  Patrcia Dolly, PA 12/31/2022 6:31 PM    Note - This record has been created using [employer]. Chart creation errors have been sought, but may not always have been located. Such creation errors do not reflect on the standard of medical care.

## 2023-01-01 NOTE — Unmapped (Addendum)
You are doing well on CPAP, keep up the good work.  I will send a prescription for continued CPAP supplies.  Follow-up in 1 year or as needed.

## 2023-01-04 DIAGNOSIS — G4733 Obstructive sleep apnea (adult) (pediatric): Principal | ICD-10-CM

## 2023-01-04 MED ORDER — FLUTICASONE PROPIONATE 50 MCG/ACTUATION NASAL SPRAY,SUSPENSION
Freq: Two times a day (BID) | NASAL | 5 refills | 60 days
Start: 2023-01-04 — End: 2024-01-04

## 2023-01-04 MED ORDER — LIDOCAINE 5 % TOPICAL OINTMENT
Freq: Three times a day (TID) | TOPICAL | 1 refills | 0 days | PRN
Start: 2023-01-04 — End: 2024-01-04

## 2023-01-06 MED ORDER — FLUTICASONE PROPIONATE 50 MCG/ACTUATION NASAL SPRAY,SUSPENSION
Freq: Two times a day (BID) | NASAL | 5 refills | 60 days | Status: CP
Start: 2023-01-06 — End: 2024-01-06

## 2023-01-06 MED ORDER — LIDOCAINE 5 % TOPICAL OINTMENT
Freq: Three times a day (TID) | TOPICAL | 1 refills | 0 days | Status: CP | PRN
Start: 2023-01-06 — End: 2024-01-06

## 2023-01-09 NOTE — Unmapped (Signed)
Patient calling to see if he needs to do blood work before seeing Aundria Rud. If so please add orders and call him at [phone]. Also would like a smaller needle for his injections as well.     Thanks   Delice Bison

## 2023-01-09 NOTE — Unmapped (Signed)
CPAP compliance and therapy notes:  DME: [facility]  Device: [employer] Air[employer] 11 AutoSet  Set up date: Loaner  Mask: N/A  Date: 12/03/2022 - 01/01/2023: 30 days  >4 hours usage: 87%  Average usage on days used: 7 hours 49 minutes  Setting: 16 cm H2O EPR 2  AHI: 2

## 2023-01-23 NOTE — Unmapped (Signed)
[facility] Refill Coordination Note    Specialty Medication(s) to be Shipped:   HUMIRA(CF) PEN 80 mg/0.8 mL Pnkt (adalimumab)    Other medication(s) to be shipped: No additional medications requested for fill at this time     Logan Quinn, DOB: [birth year]  Phone: There are no phone numbers on file.      All above HIPAA information was verified with patient.     Was a Nurse, learning disability used for this call? No    Completed refill call assessment today to schedule patient's medication shipment from the [facility] [phone]).  All relevant notes have been reviewed.     Specialty medication(s) and dose(s) confirmed: Regimen is correct and unchanged.   Changes to medications: Logan Quinn reports no changes at this time.  Changes to insurance: No  New side effects reported not previously addressed with a pharmacist or physician: None reported  Questions for the pharmacist: No    Confirmed patient received a Conservation officer, historic buildings and a Surveyor, mining with first shipment. The patient will receive a drug information handout for each medication shipped and additional FDA Medication Guides as required.       DISEASE/MEDICATION-SPECIFIC INFORMATION        For patients on injectable medications: Patient currently has 1 doses left.  Next injection is scheduled for 01/23/2023.    SPECIALTY MEDICATION ADHERENCE     Medication Adherence    Specialty Medication: adalimumab (HUMIRA,CF, PEN) 80 mg/0.8 mL PnKt              Were doses missed due to medication being on hold? No    HUMIRA(CF) PEN 80 mg/0.8 mL Pnkt (adalimumab): 1 days of medicine on hand     REFERRAL TO PHARMACIST     Referral to the pharmacist: Not needed      [facility]     Shipping address confirmed in Epic.       Delivery Scheduled: Yes, Expected medication delivery date: 01/27/2023.     Medication will be delivered via Same Day Courier to the prescription address in Epic WAM.    Logan Quinn   [facility] Specialty Technician

## 2023-01-24 MED ORDER — CYCLOBENZAPRINE 10 MG TABLET
ORAL_TABLET | Freq: Three times a day (TID) | ORAL | 1 refills | 20 days | PRN
Start: 2023-01-24 — End: 2024-01-24

## 2023-01-26 MED ORDER — CYCLOBENZAPRINE 10 MG TABLET
ORAL_TABLET | Freq: Three times a day (TID) | ORAL | 1 refills | 20 days | Status: CP | PRN
Start: 2023-01-26 — End: 2024-01-26

## 2023-01-27 MED ORDER — INSULIN SYRINGE U-100 WITH NEEDLE 0.3 ML 31 GAUGE X 5/16" (8 MM)
0 refills | 0 days
Start: 2023-01-27 — End: ?

## 2023-01-27 MED ORDER — INSULIN SYRINGE U-100 WITH NEEDLE 1 ML 31 GAUGE X 5/16" (8 MM)
1 refills | 0 days | Status: CP
Start: 2023-01-27 — End: 2024-01-27

## 2023-01-27 MED FILL — HUMIRA(CF) PEN 80 MG/0.8 ML SUBCUTANEOUS KIT: 28 days supply | Qty: 4 | Fill #6

## 2023-02-12 ENCOUNTER — Ambulatory Visit: Admit: 2023-02-12 | Payer: MEDICARE

## 2023-02-13 NOTE — Unmapped (Unsigned)
Assessment and Plan:     There are no diagnoses linked to this encounter.       I personally spent *** minutes face-to-face and non-face-to-face in the care of this patient, which includes all pre, intra, and post visit time on the date of service.    No follow-ups on file.    HPI:      Logan Quinn is here for No chief complaint on file.    [name] D. Daunte Oestreich has Diabetes type 2, uncontrolled; Lesion of radial nerve; GERD (gastroesophageal reflux disease) (RAF-HCC); Depression (RAF-HCC); Suicidal ideation; Abdominal pain; Headache; NAFLD (nonalcoholic fatty liver disease); Bleeding external hemorrhoids; Anxiety; Diarrhea; Obesity; Type 2 diabetes mellitus with hyperglycemia (CMS-HCC); Left shoulder pain; Trapezius muscle strain; Primary insomnia; Chronic joint pain; Cellulitis of right lower extremity; No diabetic retinopathy in both eyes; Congenital hypertrophy of retinal pigment epithelium; H/O retinal detachment; IBS (irritable bowel syndrome); Urinary incontinence, nocturnal enuresis; Severe obstructive sleep apnea; OSA on CPAP; Hidradenitis suppurativa; Acute non-recurrent pansinusitis; Acquired hypothyroidism; Essential hypertension; Mild intermittent asthma without complication; Chronic pansinusitis; Environmental and seasonal allergies; Erectile dysfunction; Sinus congestion; Nasal congestion; and Cough variant asthma on their problem list.         Anxiety/Depression: Patient presents for follow-up of depression. PHQ9 score goal  <10.  Depression has customarily been at goal. Symptoms have been stable. Current stressors: none identified today. Current treatment includes: medication: duloxetine, bupropion, hydroxyzine, and alprazolam.     Diabetes: Patient presents for follow up of diabetes.  A1C goal is <8.  Diabetes has customarily not been at goal (complicated by: Obesity, physical inactivity).  Current symptoms include: hyperglycemia and increase appetite. Symptoms have stabilized. Patient denies hypoglycemia  and weight loss. Evaluation to date has included: fasting lipid panel and hemoglobin A1C.  Home sugars: BGs are averaging 231. Current treatment: Continued glipizide, insulin glargine, insulin lispro via omnipod, and dulaglutide  which has been somewhat effective.  Doing regular exercise: no. He is following the mediterranean diet as close as he can.     Hypertension: Patient presents for follow-up of hypertension. Blood pressure goal < 140/90.  Hypertension has customarily been at goal complicated by DM, obesity.  Home blood pressure readings: did not bring log. Medication compliance: taking propranolol 120 mg daily and losartan 25 mg daily as prescribed. He is not doing regular exercise.      Hypothyroid: Patient presents for follow-up of hypothyroidism. Current symptoms: weight changes. Symptoms have stabilized and been well-controlled. He is taking medications on a regular basis. Current therapy includes: levothyroxine 50 mcg daily.     OSA: currently using CPAP machine 6-7 hours per night, every night.      He has a current medication list which includes the following prescription(s): acetaminophen, humira(cf) pen, albuterol, albuterol, alprazolam, ascorbic acid, accu-chek guide test strips, budesonide-formoterol, chlorhexidine, clindamycin, clindamycin, clobetasol, clobetasol, colchicine, cyclobenzaprine, dextroamphetamine sulfate, dicyclomine, diphenoxylate-atropine, dulaglutide, duloxetine, empty container, aimovig autoinjector, famotidine, fluoride (sodium), glipizide, humalog u-100 insulin, hydrochlorothiazide, hydrocortisone, hydroxyzine, ibuprofen, insulin glargine, omnipod 5 g6 pods (gen 5), insulin syringe-needle u-100, ketoconazole, lancets, levothyroxine, lidocaine, multivitamin, nystatin, ondansetron, oxybutynin, pantoprazole, polyethylene glycol, promethazine, propranolol, ramelteon, bd safetyglide needle, safety needles, tart cherry extract, tadalafil, testosterone cypionate, topiramate, wellbutrin xl, blood-glucose meter, diclofenac, fluticasone propionate, and montelukast.     ROS:      Comprehensive 10 point ROS negative unless otherwise stated in the HPI.      [facility] Components:     Medication adherence and barriers to the treatment plan have  been addressed. Opportunities to optimize healthy behaviors have been discussed. Patient / caregiver voiced understanding.    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)     Obstructive sleep apnea     OSA on CPAP     Retinal detachment 04/2014    Severe obstructive sleep apnea     Trapezius muscle strain 12/07/2013    Urinary incontinence, nocturnal enuresis     Venous insufficiency      Past Surgical History:   Procedure Laterality Date    EYE SURGERY  04/2014    LASER ABLATION INCOMPETENT VEIN INI [facility] HISTORICAL RESULT) Right     PR COLONOSCOPY FLX DX W/COLLJ SPEC WHEN PFRMD N/A 03/24/2013    Procedure: COLONOSCOPY, FLEXIBLE, PROXIMAL TO SPLENIC FLEXURE; DIAGNOSTIC, W/WO COLLECTION SPECIMEN BY BRUSH OR WASH;  Surgeon: Clint Bolder, MD;  Location: [facility];  Service: Gastroenterology    PR EYE SURG POST SGMT PROC UNLISTED Left     pneumatic retinopexy OS    PR UPPER GI ENDOSCOPY,DIAGNOSIS N/A 02/02/2013    Procedure: UGI ENDO, INCLUDE ESOPHAGUS, STOMACH, & DUODENUM &/OR JEJUNUM; DX W/WO COLLECTION SPECIMN, BY BRUSH OR WASH;  Surgeon: Malcolm Metro, MD;  Location: [facility];  Service: Gastroenterology    SKIN BIOPSY      [place] PYLORIC STENOSIS ([place] HISTORICAL RESULT)         Family History:     Family History   Problem Relation Age of Onset    Cancer Maternal Grandfather         Stomach Cancer    Hearing loss Maternal Grandfather     Cancer Paternal Grandfather         Bone Cancer, Lung Cancer    COPD Paternal Grandmother     Arthritis Paternal Grandmother     Depression Paternal Grandmother     Diabetes Mother     Heart disease Mother     Migraines Mother     Arthritis Mother     Depression Mother     GER disease Mother     Hypertension Mother     Angina Mother     COPD Mother     Glaucoma Mother     Hearing loss Mother     Cataracts Mother     Diabetes Sister     Migraines Sister     Asthma Sister     Depression Sister     Hearing loss Sister     Diabetes Brother     Asthma Brother     Diabetes Maternal Grandmother     Heart disease Maternal Grandmother     Migraines Maternal Grandmother     Depression Maternal Grandmother     Angina Maternal Grandmother     Hypertension  Maternal Grandmother     Stroke Maternal Grandmother     Diabetes Maternal Uncle     Hearing loss Maternal Uncle     Diabetes Maternal Uncle         resulted in need for kidney transplant    Liver disease Maternal Uncle     Kidney disease Maternal Uncle         needed kidney transplant    Asthma Brother     No Known Problems Father     No Known Problems Paternal Aunt     No Known Problems Paternal Uncle     No Known Problems Other     Colorectal Cancer Neg Hx     Esophageal cancer Neg Hx     Liver cancer Neg Hx     Pancreatic cancer Neg Hx     Stomach cancer Neg Hx     Amblyopia Neg Hx     Blindness Neg Hx     Retinal detachment Neg Hx     Strabismus Neg Hx     Macular degeneration Neg Hx     Anesthesia problems Neg Hx     Broken bones Neg Hx     Clotting disorder Neg Hx     Collagen disease Neg Hx     Dislocations Neg Hx     Fibromyalgia Neg Hx     Gout Neg Hx     Hemophilia Neg Hx     Osteoporosis Neg Hx     Rheumatologic disease Neg Hx     Scoliosis Neg Hx     Severe sprains Neg Hx     Sickle cell anemia Neg Hx     Spinal Compression Fracture Neg Hx     Melanoma Neg Hx     Basal cell carcinoma Neg Hx     Squamous cell carcinoma Neg Hx     Deep vein thrombosis Neg Hx     Thyroid disease Neg Hx        Social History:     Social History     Tobacco Use    Smoking status: Never     Passive exposure: Never    Smokeless tobacco: Never   Vaping Use    Vaping status: Never Used   Substance Use Topics    Alcohol use: Never     Comment: rare social    Drug use: Never       Allergies:     Erythromycin, Penicillins, Sulfa (sulfonamide antibiotics), and Other    Current Medications:     Current Outpatient Medications   Medication Sig Dispense Refill    adalimumab (HUMIRA,CF, PEN) 80 mg/0.8 mL PnKt Inject 80 mg subcutaneously once every week 4 each 11    albuterol 2.5 mg /3 mL (0.083 %) nebulizer solution Inhale 3 mL (2.5 mg total) by nebulization every four (4) hours as needed for wheezing. (Patient not taking: Reported on 08/21/2022) 180 mL 2    albuterol HFA 90 mcg/actuation inhaler Inhale 2 puffs every six (6) hours as needed for wheezing. 8.5 g 5    allopurinol (ZYLOPRIM) 100 MG tablet Take 2 tablets (200 mg total) by mouth daily. 180 tablet 1    ALPRAZolam (XANAX) 0.5 MG tablet Takes 1 tablet nightly and 1 tablet a day when needed      anastrozole (ARIMIDEX) 1 mg tablet Take 1 tablet (1 mg total) by mouth every other day. 45 tablet 3    ascorbic acid (VITAMIN C ORAL) Take 1  capsule by mouth nightly.      atorvastatin (LIPITOR) 20 MG tablet Take 1 tablet (20 mg total) by mouth daily. 90 tablet 3    BD LUER-LOK SYRINGE 3 mL 21 gauge x 1 1/2 Syrg weekly      blood sugar diagnostic (ACCU-CHEK GUIDE TEST STRIPS) Strp USE TO CHECK BLOOD SUGAR 3 TIMES DAILY BEFORE MEALS      blood-glucose meter kit Use as directed 1 each 0    budesonide-formoterol (SYMBICORT) 80-4.5 mcg/actuation inhaler Inhale 2 puffs two (2) times a day. 10.2 g 11 chlorhexidine (PERIDEX) 0.12 % solution 15 mL by Oromucosal route two (2) times a day. (Patient not taking: Reported on 12/31/2022) 900 mL 3    clindamycin (CLEOCIN T) 1 % lotion Apply topically two (2) times a day. To affected areas on body as needed for flares 60 mL 3    clobetasoL (TEMOVATE) 0.05 % ointment Apply daily to painful affected areas if needed for flares, then stop 15 g 1    colchicine (MITIGARE) 0.6 mg cap capsule Take 1 capsule (0.6 mg total) by mouth daily. 90 capsule 0    cyclobenzaprine (FLEXERIL) 10 MG tablet Take 1 tablet (10 mg total) by mouth Three (3) times a day as needed for muscle spasms. 60 tablet 1    dextroamphetamine sulfate (DEXTROSTAT) 10 MG tablet Take 2 tablets (20 mg total) by mouth two (2) times a day.      dicyclomine (BENTYL) 10 mg capsule Take 1 capsule (10 mg total) by mouth Four (4) times a day (before meals and nightly). 120 capsule 3    diphenoxylate-atropine (LOMOTIL) 2.5-0.025 mg per tablet Take 1 tablet by mouth four (4) times a day as needed for diarrhea. 30 tablet 1    DULoxetine (CYMBALTA) 60 MG capsule Take 1 capsule (60 mg total) by mouth Two (2) times a day. 180 capsule 0    empty container Misc Use as directed to dispose of needles. When full, make sure lid is closed tightly then dispose of container in trash. 1 each 2    erenumab-aooe (AIMOVIG AUTOINJECTOR) 140 mg/mL AtIn Inject 1 Pen under the skin every thirty (30) days. 3 mL 1    famotidine (PEPCID) 20 MG tablet Take 1 tablet (20 mg total) by mouth two (2) times a day. 180 tablet 3    fluocinonide (LIDEX) 0.05 % gel Apply 1 Application topically Three (3) times a day.      fluoride, sodium, 1.1 % Pste       fluticasone propionate (FLONASE) 50 mcg/actuation nasal spray 1 spray into each nostril two (2) times a day. 16 g 5    glipiZIDE (GLUCOTROL) 10 MG tablet Take 2 tablets (20 mg total) by mouth Two (2) times a day (30 minutes before a meal). 360 tablet 3    HUMALOG U-100 INSULIN 100 unit/mL injection INJECT 120 UNITS UNDER THE SKIN DAILY VIA OMNIPOD 40 mL 5    hydrocortisone (PROCTOSOL HC) 2.5 % rectal cream Insert 1 Application into the rectum two (2) times a day as needed. 28.35 g 2    hydrOXYzine (ATARAX) 25 MG tablet Take 1 tablet (25 mg total) by mouth Three (3) times a day as needed. May take two before bed for sleep. 120 tablet 1    ibuprofen (ADVIL,MOTRIN) 800 MG tablet Take 1 tablet (800 mg total) by mouth every eight (8) hours as needed. 90 tablet 1    insulin glargine (LANTUS U-100 INSULIN) 100 unit/mL injection Inject 0.05  mL (5 Units total) under the skin nightly. 10 mL 1    insulin pump cart,automated,BT (OMNIPOD 5 G6 PODS, GEN 5,) Crtg Change POD every 48 hours. 3 each 11    insulin syringe-needle U-100 (BD INSULIN SYRINGE ULTRA-FINE) 1 mL 31 gauge x 5/16 (8 mm) Syrg Once daily for insulin dosing. 100 each 1    insulin syringe-needle U-100 0.3 mL 31 gauge x 5/16 (8 mm) Syrg USE DAILY WITH LANTUS INJECTIONS      insulin syringe-needle U-100 1 mL 31 gauge x 5/16 (8 mm) Syrg USE AS DIRECTED ONE TIME DAILY FOR INSULIN DOSING      ketoconazole (NIZORAL) 2 % cream Apply 1 Application topically daily. To affected area on body until clear 60 g 3    lancets (ACCU-CHEK FASTCLIX LANCET DRUM) Misc Use to check blood sugars 3 times a day before meals or as directed 200 each 5    lancets (ACCU-CHEK FASTCLIX LANCET DRUM) Misc USE TO CHECK BLOOD SUGAR THREE TIMES DAILY BEFORE MEALS OR AS DIRECTED      levothyroxine (SYNTHROID) 50 MCG tablet Take 1 tablet (50 mcg total) by mouth daily. 90 tablet 1    lidocaine (XYLOCAINE) 5 % ointment Apply topically Three (3) times a day as needed. 30 g 1    LIDOCAINE 2 % solution Swish and spit three times a day as needed      losartan (COZAAR) 25 MG tablet Take 1 tablet (25 mg total) by mouth daily. 90 tablet 3    montelukast (SINGULAIR) 10 mg tablet Take 1 tablet (10 mg total) by mouth daily. 90 tablet 3    multivitamin (MULTIPLE VITAMIN ORAL) Take 1 tablet by mouth daily. ondansetron (ZOFRAN) 4 MG tablet Take 1 tablet (4 mg total) by mouth every eight (8) hours as needed for nausea. 30 tablet 1    oxybutynin (DITROPAN) 5 MG tablet Take 1 tablet (5 mg total) by mouth Two (2) times a day. 180 tablet 1    pantoprazole (PROTONIX) 40 MG tablet Take 1 tablet (40 mg total) by mouth two (2) times a day. 180 tablet 1    polyethylene glycol (GLYCOLAX) 17 gram/dose powder       propranoloL (INDERAL LA) 120 mg 24 hr capsule Take 1 capsule (120 mg total) by mouth daily. 90 capsule 1    ramelteon (ROZEREM) 8 mg tablet Take 1 tablet (8 mg total) by mouth nightly.      safety needles (BD SAFETYGLIDE NEEDLE) 18 gauge x 1 1/2 Ndle USE ONE NEEDLE ONCE WEEKLY      safety needles (BD SAFETYGLIDE NEEDLE) 23 gauge x 1 Ndle 1 Units by Miscellaneous route once a week. 12 each 11    safety needles 18 gauge x 1 1/2 Ndle 1 Units by Miscellaneous route once a week. 12 each 11    semaglutide 0.25 mg or 0.5 mg (2 mg/3 mL) PnIj Inject 0.5 mg under the skin every seven (7) days. 3 mL 2    sour cherry extract (TART CHERRY EXTRACT) 1,000 mg cap       syringe with needle (BD LUER-LOK SYRINGE) 3 mL 21 gauge x 1 1/2 Syrg USE AS DIRECTED ONCE A WEEK      tadalafil (CIALIS) 5 MG tablet Take 1 tablet (5 mg total) by mouth in the morning. 90 tablet 3    testosterone cypionate (DEPOTESTOTERONE CYPIONATE) 200 mg/mL injection Inject 0.5 mL (100 mg total) into the muscle once a week. Inject 0.5cc (100mg ) weekly 2 mL  5    topiramate (TOPAMAX) 50 MG tablet Take 1.5 tablets (75 mg total) by mouth two (2) times a day. 270 tablet 3    WELLBUTRIN XL 150 mg 24 hr tablet Take 1 tablet (150 mg total) by mouth every morning.       No current facility-administered medications for this visit.       Health Maintenance:     Health Maintenance   Topic Date Due    COVID-19 Vaccine (3 - Pfizer risk series) 01/24/2021    Retinal Eye Exam  10/31/2022    Serum Creatinine Monitoring  01/16/2023    Potassium Monitoring  01/16/2023    Influenza Vaccine (1) 02/09/2023    Hemoglobin A1c  02/26/2023    Foot Exam  03/19/2023    DTaP/Tdap/Td Vaccines (4 - Td or Tdap) 07/17/2023    Urine Albumin/Creatinine Ratio  11/26/2023    Pneumococcal Vaccine 0-64  Completed    Hepatitis C Screen  Completed       Immunizations:     Immunization History   Administered Date(s) Administered    COVID-19 VACC,MRNA,(PFIZER)(PF) 12/04/2020, 12/27/2020    HEPATITIS B VACCINE ADULT,IM(ENERGIX B, RECOMBIVAX) 03/05/2013, 04/05/2013, 09/03/2013    Hepatitis B Vaccine, Unspecified Formulation 12/27/1999, 04/29/2000    INFLUENZA INJ MDCK PF, QUAD,(FLUCELVAX)(47MO AND UP EGG FREE) 03/31/2019    INFLUENZA TIV (TRI) PF (IM) 10/08/2011    Influenza Vaccine Quad(IM)6 MO-Adult(PF) 03/05/2013, 03/18/2014, 03/22/2015, 03/05/2016, 03/19/2017, 02/23/2018, 02/19/2022    Influenza Virus Vaccine, unspecified formulation 03/19/2017, 02/23/2018, 03/25/2019, 03/10/2020, 04/05/2021    PNEUMOCOCCAL POLYSACCHARIDE 23-VALENT 12/30/2012    PPD Test 10/28/2016    Pneumococcal Conjugate 20-valent 01/02/2022    TD(TDVAX),ADSORBED,2LF(IM)(PF) 04/29/2000    TdaP 07/18/2008, 07/16/2013     I have reviewed and (if needed) updated the patient's problem list, medications, allergies, past medical and surgical history, social and family history.     Vital Signs:     Wt Readings from Last 3 Encounters:   12/31/22 (!) 209.4 kg (461 lb 11.2 oz)   11/26/22 (!) 208.2 kg (459 lb)   10/14/22 (!) 213.6 kg (471 lb)     Temp Readings from Last 3 Encounters:   12/31/22 36.9 ??C (98.5 ??F) (Temporal)   11/26/22 36.7 ??C (98.1 ??F) (Oral)   10/14/22 36.4 ??C (97.6 ??F) (Temporal)     BP Readings from Last 3 Encounters:   12/31/22 140/77   11/26/22 128/82   10/14/22 123/78     Pulse Readings from Last 3 Encounters:   12/31/22 88   11/26/22 71   10/14/22 69     Estimated body mass index is 64.39 kg/m?? as calculated from the following:    Height as of 12/31/22: 180.3 cm (5' 11).    Weight as of 12/31/22: 209.4 kg (461 lb 11.2 oz).  No height and weight on file for this encounter.      Objective:      General: Alert and oriented x3. Well-appearing. No acute distress. ***  HEENT:  Normocephalic.  Atraumatic. Conjunctiva and sclera normal. OP MMM without lesions. ***  Neck:  Supple. No thyroid enlargement. No adenopathy. ***  Heart:  Regular rate and rhythm. Normal S1, S2. No murmurs, rubs or gallops. ***  Lungs:  No respiratory distress.  Lungs clear to auscultation. No wheezes, rhonchi, or rales. ***  GI/GU:  Soft, +BS, nondistended, non-TTP. No palpable masses or organomegaly. ***  Extremities:  No edema. Peripheral pulses normal. ***  Skin:  Warm, dry. No rash or lesions  present. ***  Neuro:  Non-focal. No obvious weakness. ***  Psych:  Affect normal, eye contact good, speech clear and coherent. ***       I attest that I, Arta Bruce, personally documented this note while acting as scribe for [name], FNP.      Arta Bruce, Scribe.  02/27/2023     The documentation recorded by the scribe accurately reflects the service I personally performed and the decisions made by me.     [name], FNP

## 2023-02-24 MED ORDER — ANASTROZOLE 1 MG TABLET
ORAL_TABLET | ORAL | 3 refills | 90 days
Start: 2023-02-24 — End: ?

## 2023-02-24 NOTE — Unmapped (Signed)
[facility] Refill Coordination Note    Specialty Medication(s) to be Shipped:   Inflammatory Disorders: Humira    Other medication(s) to be shipped: No additional medications requested for fill at this time     Logan Quinn, DOB: 1980/10/20  Phone: There are no phone numbers on file.      All above HIPAA information was verified with patient's family member, wife.     Was a Nurse, learning disability used for this call? No    Completed refill call assessment today to schedule patient's medication shipment from the [facility]  [phone]).  All relevant notes have been reviewed.     Specialty medication(s) and dose(s) confirmed: Regimen is correct and unchanged.   Changes to medications: Sneijder reports no changes at this time.  Changes to insurance: No  New side effects reported not previously addressed with a pharmacist or physician: None reported  Questions for the pharmacist: No    Confirmed patient received a Conservation officer, historic buildings and a Surveyor, mining with first shipment. The patient will receive a drug information handout for each medication shipped and additional FDA Medication Guides as required.       DISEASE/MEDICATION-SPECIFIC INFORMATION        For patients on injectable medications: Patient currently has 0 doses left.  Next injection is scheduled for 09/20.    SPECIALTY MEDICATION ADHERENCE     Medication Adherence    Patient reported X missed doses in the last month: 0  Specialty Medication: adalimumab (HUMIRA,CF, PEN) 80 mg/0.8 mL PnKt  Patient is on additional specialty medications: No  Patient is on more than two specialty medications: No  Any gaps in refill history greater than 2 weeks in the last 3 months: no  Demonstrates understanding of importance of adherence: yes  Informant: spouse  Reliability of informant: reliable  Provider-estimated medication adherence level: good  Patient is at risk for Non-Adherence: No  Reasons for non-adherence: no problems identified  Confirmed plan for next specialty medication refill: delivery by pharmacy  Refills needed for supportive medications: not needed          Refill Coordination    Has the Patients' Contact Information Changed: No  Is the Shipping Address Different: No         Were doses missed due to medication being on hold? No    humira 80/0.8 mg/ml: 0 days of medicine on hand       REFERRAL TO PHARMACIST     Referral to the pharmacist: Not needed      [facility]     Shipping address confirmed in Epic.       Delivery Scheduled: Yes, Expected medication delivery date: 09/18.     Medication will be delivered via Same Day Courier to the prescription address in Epic WAM.    Antonietta Barcelona   [facility]  Specialty Technician

## 2023-02-26 MED FILL — HUMIRA(CF) PEN 80 MG/0.8 ML SUBCUTANEOUS KIT: 28 days supply | Qty: 4 | Fill #7

## 2023-02-27 DIAGNOSIS — N3944 Nocturnal enuresis: Principal | ICD-10-CM

## 2023-02-27 DIAGNOSIS — N3281 Overactive bladder: Principal | ICD-10-CM

## 2023-02-27 MED ORDER — DICYCLOMINE 10 MG CAPSULE
ORAL_CAPSULE | Freq: Four times a day (QID) | ORAL | 3 refills | 30 days
Start: 2023-02-27 — End: 2024-02-27

## 2023-02-27 MED ORDER — OXYBUTYNIN CHLORIDE 5 MG TABLET
ORAL_TABLET | Freq: Two times a day (BID) | ORAL | 1 refills | 90 days
Start: 2023-02-27 — End: 2024-02-27

## 2023-02-28 ENCOUNTER — Ambulatory Visit (INDEPENDENT_AMBULATORY_CARE_PROVIDER_SITE_OTHER): Payer: 59 | Admitting: Podiatry

## 2023-02-28 DIAGNOSIS — Z91199 Patient's noncompliance with other medical treatment and regimen due to unspecified reason: Secondary | ICD-10-CM

## 2023-02-28 MED ORDER — DICYCLOMINE 10 MG CAPSULE
ORAL_CAPSULE | Freq: Four times a day (QID) | ORAL | 1 refills | 30 days | Status: CP
Start: 2023-02-28 — End: 2024-02-28

## 2023-02-28 MED ORDER — OXYBUTYNIN CHLORIDE 5 MG TABLET
ORAL_TABLET | Freq: Two times a day (BID) | ORAL | 1 refills | 90 days | Status: CP
Start: 2023-02-28 — End: 2024-02-28

## 2023-02-28 NOTE — Progress Notes (Signed)
1. No-show for appointment     

## 2023-02-28 NOTE — Unmapped (Signed)
Patient is requesting the following refill  Requested Prescriptions     Pending Prescriptions Disp Refills    oxybutynin (DITROPAN) 5 MG tablet 180 tablet 1     Sig: Take 1 tablet (5 mg total) by mouth two (2) times a day.    dicyclomine (BENTYL) 10 mg capsule 120 capsule 1     Sig: Take 1 capsule (10 mg total) by mouth Four (4) times a day (before meals and nightly).       Recent Visits  Date Type Provider Dept   11/26/22 Office Visit Loran Senters, FNP [facility] [address] [facility]   08/21/22 Office Visit Loran Senters, FNP [facility] [address] [facility]   07/31/22 Office Visit Loran Senters, FNP [facility] [address] [facility] [place]   07/02/22 Office Visit Loran Senters, FNP [place] [facility] [address] [facility]   05/21/22 Office Visit Loran Senters, FNP [facility] [address] [facility]   Showing recent visits within past 365 days and meeting all other requirements  Future Appointments  Date Type Provider Dept   03/05/23 Appointment Loran Senters, FNP [facility] [address] At [facility]   Showing future appointments within next 365 days and meeting all other requirements       Labs: Not applicable this refill

## 2023-03-05 ENCOUNTER — Ambulatory Visit: Admit: 2023-03-05 | Discharge: 2023-03-06 | Payer: MEDICARE | Attending: Family | Primary: Family

## 2023-03-05 DIAGNOSIS — E1165 Type 2 diabetes mellitus with hyperglycemia: Principal | ICD-10-CM

## 2023-03-05 DIAGNOSIS — G4733 Obstructive sleep apnea (adult) (pediatric): Principal | ICD-10-CM

## 2023-03-05 DIAGNOSIS — R0981 Nasal congestion: Principal | ICD-10-CM

## 2023-03-05 DIAGNOSIS — Z6841 Body Mass Index (BMI) 40.0 and over, adult: Principal | ICD-10-CM

## 2023-03-05 DIAGNOSIS — E039 Hypothyroidism, unspecified: Principal | ICD-10-CM

## 2023-03-05 DIAGNOSIS — M1A0720 Idiopathic chronic gout, left ankle and foot, without tophus (tophi): Principal | ICD-10-CM

## 2023-03-05 DIAGNOSIS — J4520 Mild intermittent asthma, uncomplicated: Principal | ICD-10-CM

## 2023-03-05 DIAGNOSIS — Z794 Long term (current) use of insulin: Principal | ICD-10-CM

## 2023-03-05 DIAGNOSIS — J302 Other seasonal allergic rhinitis: Principal | ICD-10-CM

## 2023-03-05 DIAGNOSIS — I10 Essential (primary) hypertension: Principal | ICD-10-CM

## 2023-03-05 LAB — COMPREHENSIVE METABOLIC PANEL
ALBUMIN: 3.8 g/dL (ref 3.4–5.0)
ALKALINE PHOSPHATASE: 88 U/L (ref 46–116)
ALT (SGPT): 24 U/L (ref 10–49)
ANION GAP: 8 mmol/L (ref 5–14)
AST (SGOT): 21 U/L (ref <=34)
BILIRUBIN TOTAL: 1.7 mg/dL — ABNORMAL HIGH (ref 0.3–1.2)
BLOOD UREA NITROGEN: 9 mg/dL (ref 9–23)
BUN / CREAT RATIO: 10
CALCIUM: 9.6 mg/dL (ref 8.7–10.4)
CHLORIDE: 106 mmol/L (ref 98–107)
CO2: 25 mmol/L (ref 20.0–31.0)
CREATININE: 0.92 mg/dL
EGFR CKD-EPI (2021) MALE: >90 mL/min/{1.73_m2} (ref >=60)
GLUCOSE RANDOM: 117 mg/dL (ref 70–179)
POTASSIUM: 3.9 mmol/L (ref 3.4–4.8)
PROTEIN TOTAL: 7.9 g/dL (ref 5.7–8.2)
SODIUM: 139 mmol/L (ref 135–145)

## 2023-03-05 LAB — URIC ACID: URIC ACID: 8 mg/dL

## 2023-03-05 LAB — TSH: THYROID STIMULATING HORMONE: 1.937 u[IU]/mL (ref 0.550–4.780)

## 2023-03-05 MED ORDER — SEMAGLUTIDE 1 MG/DOSE (2 MG/1.5 ML) SUBCUTANEOUS PEN INJECTOR
SUBCUTANEOUS | 2 refills | 28 days | Status: CP
Start: 2023-03-05 — End: ?

## 2023-03-05 MED ORDER — MONTELUKAST 10 MG TABLET
ORAL_TABLET | Freq: Every day | ORAL | 3 refills | 90 days | Status: CP
Start: 2023-03-05 — End: 2024-03-05

## 2023-03-05 NOTE — Unmapped (Signed)
-  Called [facility] and left patient message to call the [facility] back to reschedule the below appt(s):    Provider: Aundria Rud  Type of Appt: 10/8 reschedule w/ Harriette Ohara, or [facility]  Radiology: N/A      Asked the patient to call back at [phone].    Thank you,    Gildardo Pounds  Urology Clinic  [phone]

## 2023-03-05 NOTE — Unmapped (Signed)
Assessment and Plan:     Type 2 diabetes mellitus with hyperglycemia, with long-term current use of insulin (CMS-HCC)  HGB A1c 7.9 (unchanged from 7.9 three months ago). DM poorly controlled. Continue Trulicity 4.5 mg weekly, Humalog up to 120 units daily via Omnipod, Lantus 5 units nightly, glipizide 20 mg BID. Increase semaglutide 1.0 mg weekly titrating dose up every 4 weeks as tolerated. Encouraged patient to continue carb controlled diet and regular exercise.   - Comprehensive Metabolic Panel  - POCT glycosylated hemoglobin (Hb A1C)    Acquired hypothyroidism  Last TSH 2.690 01/15/22. Continue levothyroxine 50 mcg daily.   Recheck TSH. Will titrate medication as necessary.  - TSH    OSA on CPAP  Patient using CPAP as ordered and benefits from use.   Continue CPAP machine nightly.    Essential hypertension  BP at goal (118/76 in clinic today upon recheck).   Continue propranolol 120 mg daily and losartan 25 mg daily. Continue atorvastatin 20 mg daily. Reviewed low sodium diet and encouraged regular exercise. Advised to continue to monitor and log at-home BP readings. Contact clinic if readings become elevated.  - Comprehensive Metabolic Panel    Mild intermittent asthma without complication  Asthma stable. Continue daily maintenance with Symbicort and rescue inhaler, albuterol, as needed.   - montelukast (SINGULAIR) 10 mg tablet; Take 1 tablet (10 mg total) by mouth daily.    Nasal congestion    Idiopathic chronic gout of left foot without tophus  The nature of gout is fully explained, including dietary relationship, acute and interval phase and treatment of both. Long term complications such as kidney stones, tophi and arthritis are discussed. Patient currently on allopurinol 200 mg daily without breakthrough gout flares. Proper use of indomethacin for acute attacks discussed, and its side effects. Call if further attacks occur. Recheck uric acid today.  - Uric acid    Class 3 severe obesity with serious comorbidity and body mass index (BMI) of 60.0 to 69.9 in adult, unspecified obesity type (CMS-HCC)  - semaglutide 1 mg/dose (2 mg/1.5 mL) PnIj; Inject 1 mg under the skin every seven (7) days.    Seasonal allergic rhinitis, unspecified trigger  - montelukast (SINGULAIR) 10 mg tablet; Take 1 tablet (10 mg total) by mouth daily.         I personally spent 40 minutes face-to-face and non-face-to-face in the care of this patient, which includes all pre, intra, and post visit time on the date of service.    Return in about 3 months (around 06/04/2023) for Next scheduled follow up.    HPI:      Logan Quinn is here for   Chief Complaint   Patient presents with    Follow-up     Logan D. Jezreel Oh has Diabetes type 2, uncontrolled; Lesion of radial nerve; GERD (gastroesophageal reflux disease) (RAF-HCC); Depression (RAF-HCC); Suicidal ideation; Abdominal pain; Headache; NAFLD (nonalcoholic fatty liver disease); Bleeding external hemorrhoids; Anxiety; Diarrhea; Obesity; Type 2 diabetes mellitus with hyperglycemia (CMS-HCC); Left shoulder pain; Trapezius muscle strain; Primary insomnia; Chronic joint pain; Cellulitis of right lower extremity; No diabetic retinopathy in both eyes; Congenital hypertrophy of retinal pigment epithelium; H/O retinal detachment; IBS (irritable bowel syndrome); Urinary incontinence, nocturnal enuresis; Severe obstructive sleep apnea; OSA on CPAP; Hidradenitis suppurativa; Acute non-recurrent pansinusitis; Acquired hypothyroidism; Essential hypertension; Mild intermittent asthma without complication; Chronic pansinusitis; Environmental and seasonal allergies; Erectile dysfunction; Sinus congestion; Nasal congestion; and Cough variant asthma on their problem list.  Anxiety/Depression: Patient presents for follow-up of depression. PHQ9 score goal  <10.  Depression has customarily been at goal. Symptoms have been stable. Current stressors: none identified today. Current treatment includes: medication: duloxetine, bupropion, hydroxyzine, and alprazolam.     Diabetes: Patient presents for follow up of diabetes.  A1C goal is <8.  Diabetes has customarily not been at goal (complicated by: Obesity, physical inactivity).  Current symptoms include: hyperglycemia and increase appetite. Symptoms have stabilized. Patient denies hypoglycemia  and unintentional weight loss. Evaluation to date has included: fasting lipid panel and hemoglobin A1C.  Home sugars: BGs have been elevated consistently. Current treatment: Continued glipizide, insulin glargine, insulin lispro via omnipod, and dulaglutide  which has been somewhat effective.  Doing regular exercise: no. He is following the mediterranean diet as close as he can.     Hypertension: Patient presents for follow-up of hypertension. Blood pressure goal < 140/90.  Hypertension has customarily been at goal complicated by DM, obesity.  Home blood pressure readings: did not bring log. Medication compliance: taking propranolol 120 mg daily and losartan 25 mg daily as prescribed. He is not doing regular exercise.      Hypothyroid: Patient presents for follow-up of hypothyroidism. Current symptoms: weight changes. Symptoms have stabilized and been well-controlled. He is taking medications on a regular basis. Current therapy includes: levothyroxine 50 mcg daily.     OSA: currently using CPAP machine 8-11 hours per night, every night. He benefits from nightly use.       He has a current medication list which includes the following prescription(s): acetaminophen, humira(cf) pen, albuterol, albuterol, alprazolam, ascorbic acid, accu-chek guide test strips, budesonide-formoterol, chlorhexidine, clindamycin, clindamycin, clobetasol, clobetasol, colchicine, cyclobenzaprine, dextroamphetamine sulfate, dicyclomine, diphenoxylate-atropine, dulaglutide, duloxetine, empty container, aimovig autoinjector, famotidine, fluoride (sodium), glipizide, humalog u-100 insulin, hydrochlorothiazide, hydrocortisone, hydroxyzine, ibuprofen, insulin glargine, omnipod 5 g6 pods (gen 5), insulin syringe-needle u-100, ketoconazole, lancets, levothyroxine, lidocaine, multivitamin, nystatin, ondansetron, oxybutynin, pantoprazole, polyethylene glycol, promethazine, propranolol, ramelteon, bd safetyglide needle, safety needles, tart cherry extract, tadalafil, testosterone cypionate, topiramate, wellbutrin xl, blood-glucose meter, diclofenac, fluticasone propionate, and montelukast.     ROS:      Comprehensive 10 point ROS negative unless otherwise stated in the HPI.      [facility] Components:     Medication adherence and barriers to the treatment plan have been addressed. Opportunities to optimize healthy behaviors have been discussed. Patient / caregiver voiced understanding.    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)     Obstructive sleep apnea     OSA on CPAP     Retinal detachment 04/2014    Severe obstructive sleep apnea     Trapezius muscle strain 12/07/2013    Urinary incontinence, nocturnal enuresis     Venous insufficiency  Past Surgical History:   Procedure Laterality Date    EYE SURGERY  04/2014    LASER ABLATION INCOMPETENT VEIN INI [facility] HISTORICAL RESULT) Right     PR COLONOSCOPY FLX DX W/COLLJ SPEC WHEN PFRMD N/A 03/24/2013    Procedure: COLONOSCOPY, FLEXIBLE, PROXIMAL TO SPLENIC FLEXURE; DIAGNOSTIC, W/WO COLLECTION SPECIMEN BY BRUSH OR WASH;  Surgeon: Clint Bolder, MD;  Location: [facility];  Service: Gastroenterology    PR EYE SURG POST SGMT PROC UNLISTED Left     pneumatic retinopexy OS    PR UPPER GI ENDOSCOPY,DIAGNOSIS N/A 02/02/2013    Procedure: UGI ENDO, INCLUDE ESOPHAGUS, STOMACH, & DUODENUM &/OR JEJUNUM; DX W/WO COLLECTION SPECIMN, BY BRUSH OR WASH;  Surgeon: Malcolm Metro, MD;  Location: [facility];  Service: Gastroenterology    SKIN BIOPSY      [place] PYLORIC STENOSIS ([place] HISTORICAL RESULT)         Family History:     Family History   Problem Relation Age of Onset    Cancer Maternal Grandfather         Stomach Cancer    Hearing loss Maternal Grandfather     Cancer Paternal Grandfather         Bone Cancer, Lung Cancer    COPD Paternal Grandmother     Arthritis Paternal Grandmother     Depression Paternal Grandmother     Diabetes Mother     Heart disease Mother     Migraines Mother     Arthritis Mother     Depression Mother     GER disease Mother     Hypertension Mother     Angina Mother     COPD Mother     Glaucoma Mother     Hearing loss Mother     Cataracts Mother     Diabetes Sister     Migraines Sister     Asthma Sister     Depression Sister     Hearing loss Sister     Diabetes Brother     Asthma Brother     Diabetes Maternal Grandmother     Heart disease Maternal Grandmother     Migraines Maternal Grandmother     Depression Maternal Grandmother     Angina Maternal Grandmother     Hypertension Maternal Grandmother     Stroke Maternal Grandmother     Diabetes Maternal Uncle     Hearing loss Maternal Uncle     Diabetes Maternal Uncle         resulted in need for kidney transplant    Liver disease Maternal Uncle     Kidney disease Maternal Uncle         needed kidney transplant    Asthma Brother     No Known Problems Father     No Known Problems Paternal Aunt     No Known Problems Paternal Uncle     No Known Problems Other     Colorectal Cancer Neg Hx     Esophageal cancer Neg Hx     Liver cancer Neg Hx     Pancreatic cancer Neg Hx     Stomach cancer Neg Hx     Amblyopia Neg Hx     Blindness Neg Hx     Retinal detachment Neg Hx     Strabismus Neg Hx     Macular degeneration Neg Hx     Anesthesia problems Neg Hx     Broken bones Neg Hx  Clotting disorder Neg Hx     Collagen disease Neg Hx     Dislocations Neg Hx     Fibromyalgia Neg Hx     Gout Neg Hx     Hemophilia Neg Hx     Osteoporosis Neg Hx     Rheumatologic disease Neg Hx     Scoliosis Neg Hx     Severe sprains Neg Hx     Sickle cell anemia Neg Hx     Spinal Compression Fracture Neg Hx     Melanoma Neg Hx     Basal cell carcinoma Neg Hx     Squamous cell carcinoma Neg Hx     Deep vein thrombosis Neg Hx     Thyroid disease Neg Hx        Social History:     Social History     Tobacco Use    Smoking status: Never     Passive exposure: Never    Smokeless tobacco: Never   Vaping Use    Vaping status: Never Used   Substance Use Topics    Alcohol use: Never     Comment: rare social    Drug use: Never       Allergies:     Erythromycin, Penicillins, Sulfa (sulfonamide antibiotics), and Other    Current Medications:     Current Outpatient Medications   Medication Sig Dispense Refill    adalimumab (HUMIRA,CF, PEN) 80 mg/0.8 mL PnKt Inject 80 mg subcutaneously once every week 4 each 11    albuterol HFA 90 mcg/actuation inhaler Inhale 2 puffs every six (6) hours as needed for wheezing. 8.5 g 5    allopurinol (ZYLOPRIM) 100 MG tablet Take 2 tablets (200 mg total) by mouth daily. 180 tablet 1    ALPRAZolam (XANAX) 0.5 MG tablet Takes 1 tablet nightly and 1 tablet a day when needed      anastrozole (ARIMIDEX) 1 mg tablet Take 1 tablet (1 mg total) by mouth every other day. 45 tablet 3    ascorbic acid (VITAMIN C ORAL) Take 1 capsule by mouth nightly.      atorvastatin (LIPITOR) 20 MG tablet Take 1 tablet (20 mg total) by mouth daily. 90 tablet 3    BD LUER-LOK SYRINGE 3 mL 21 gauge x 1 1/2 Syrg weekly      blood sugar diagnostic (ACCU-CHEK GUIDE TEST STRIPS) Strp USE TO CHECK BLOOD SUGAR 3 TIMES DAILY BEFORE MEALS budesonide-formoterol (SYMBICORT) 80-4.5 mcg/actuation inhaler Inhale 2 puffs two (2) times a day. 10.2 g 11    clindamycin (CLEOCIN T) 1 % lotion Apply topically two (2) times a day. To affected areas on body as needed for flares 60 mL 3    clobetasoL (TEMOVATE) 0.05 % ointment Apply daily to painful affected areas if needed for flares, then stop 15 g 1    colchicine (MITIGARE) 0.6 mg cap capsule Take 1 capsule (0.6 mg total) by mouth daily. 90 capsule 0    cyclobenzaprine (FLEXERIL) 10 MG tablet Take 1 tablet (10 mg total) by mouth Three (3) times a day as needed for muscle spasms. 60 tablet 1    dextroamphetamine sulfate (DEXTROSTAT) 10 MG tablet Take 2 tablets (20 mg total) by mouth two (2) times a day.      dicyclomine (BENTYL) 10 mg capsule Take 1 capsule (10 mg total) by mouth Four (4) times a day (before meals and nightly). 120 capsule 1    diphenoxylate-atropine (LOMOTIL) 2.5-0.025 mg per tablet Take 1  tablet by mouth four (4) times a day as needed for diarrhea. 30 tablet 1    DULoxetine (CYMBALTA) 60 MG capsule Take 1 capsule (60 mg total) by mouth Two (2) times a day. 180 capsule 0    empty container Misc Use as directed to dispose of needles. When full, make sure lid is closed tightly then dispose of container in trash. 1 each 2    erenumab-aooe (AIMOVIG AUTOINJECTOR) 140 mg/mL AtIn Inject 1 Pen under the skin every thirty (30) days. 3 mL 1    famotidine (PEPCID) 20 MG tablet Take 1 tablet (20 mg total) by mouth two (2) times a day. 180 tablet 3    fluocinonide (LIDEX) 0.05 % gel Apply 1 Application topically Three (3) times a day.      fluoride, sodium, 1.1 % Pste       fluticasone propionate (FLONASE) 50 mcg/actuation nasal spray 1 spray into each nostril two (2) times a day. 16 g 5    glipiZIDE (GLUCOTROL) 10 MG tablet Take 2 tablets (20 mg total) by mouth Two (2) times a day (30 minutes before a meal). 360 tablet 3    HUMALOG U-100 INSULIN 100 unit/mL injection INJECT 120 UNITS UNDER THE SKIN DAILY VIA OMNIPOD 40 mL 5    hydrocortisone (PROCTOSOL HC) 2.5 % rectal cream Insert 1 Application into the rectum two (2) times a day as needed. 28.35 g 2    hydrOXYzine (ATARAX) 25 MG tablet Take 1 tablet (25 mg total) by mouth Three (3) times a day as needed. May take two before bed for sleep. 120 tablet 1    ibuprofen (ADVIL,MOTRIN) 800 MG tablet Take 1 tablet (800 mg total) by mouth every eight (8) hours as needed. 90 tablet 1    insulin glargine (LANTUS U-100 INSULIN) 100 unit/mL injection Inject 0.05 mL (5 Units total) under the skin nightly. 10 mL 1    insulin pump cart,automated,BT (OMNIPOD 5 G6 PODS, GEN 5,) Crtg Change POD every 48 hours. 3 each 11    insulin syringe-needle U-100 (BD INSULIN SYRINGE ULTRA-FINE) 1 mL 31 gauge x 5/16 (8 mm) Syrg Once daily for insulin dosing. 100 each 1    insulin syringe-needle U-100 0.3 mL 31 gauge x 5/16 (8 mm) Syrg USE DAILY WITH LANTUS INJECTIONS      insulin syringe-needle U-100 1 mL 31 gauge x 5/16 (8 mm) Syrg USE AS DIRECTED ONE TIME DAILY FOR INSULIN DOSING      ketoconazole (NIZORAL) 2 % cream Apply 1 Application topically daily. To affected area on body until clear 60 g 3    lancets (ACCU-CHEK FASTCLIX LANCET DRUM) Misc Use to check blood sugars 3 times a day before meals or as directed 200 each 5    lancets (ACCU-CHEK FASTCLIX LANCET DRUM) Misc USE TO CHECK BLOOD SUGAR THREE TIMES DAILY BEFORE MEALS OR AS DIRECTED      levothyroxine (SYNTHROID) 50 MCG tablet Take 1 tablet (50 mcg total) by mouth daily. 90 tablet 1    lidocaine (XYLOCAINE) 5 % ointment Apply topically Three (3) times a day as needed. 30 g 1    LIDOCAINE 2 % solution Swish and spit three times a day as needed      losartan (COZAAR) 25 MG tablet Take 1 tablet (25 mg total) by mouth daily. 90 tablet 3    multivitamin (MULTIPLE VITAMIN ORAL) Take 1 tablet by mouth daily.      ondansetron (ZOFRAN) 4 MG tablet Take 1 tablet (4  mg total) by mouth every eight (8) hours as needed for nausea. 30 tablet 1 oxybutynin (DITROPAN) 5 MG tablet Take 1 tablet (5 mg total) by mouth two (2) times a day. 180 tablet 1    pantoprazole (PROTONIX) 40 MG tablet Take 1 tablet (40 mg total) by mouth two (2) times a day. 180 tablet 1    polyethylene glycol (GLYCOLAX) 17 gram/dose powder       promethazine (PHENERGAN) 25 MG tablet Take 1 tablet (25 mg total) by mouth every eight (8) hours as needed for nausea. 30 tablet 1    propranoloL (INDERAL LA) 120 mg 24 hr capsule Take 1 capsule (120 mg total) by mouth daily. 90 capsule 1    ramelteon (ROZEREM) 8 mg tablet Take 1 tablet (8 mg total) by mouth nightly.      safety needles (BD SAFETYGLIDE NEEDLE) 18 gauge x 1 1/2 Ndle USE ONE NEEDLE ONCE WEEKLY      safety needles (BD SAFETYGLIDE NEEDLE) 23 gauge x 1 Ndle 1 Units by Miscellaneous route once a week. 12 each 11    safety needles 18 gauge x 1 1/2 Ndle 1 Units by Miscellaneous route once a week. 12 each 11    sour cherry extract (TART CHERRY EXTRACT) 1,000 mg cap       syringe with needle (BD LUER-LOK SYRINGE) 3 mL 21 gauge x 1 1/2 Syrg USE AS DIRECTED ONCE A WEEK      tadalafil (CIALIS) 5 MG tablet Take 1 tablet (5 mg total) by mouth in the morning. 90 tablet 3    testosterone cypionate (DEPOTESTOTERONE CYPIONATE) 200 mg/mL injection Inject 0.5 mL (100 mg total) into the muscle once a week. Inject 0.5cc (100mg ) weekly 2 mL 5    topiramate (TOPAMAX) 50 MG tablet Take 1.5 tablets (75 mg total) by mouth two (2) times a day. 270 tablet 3    WELLBUTRIN XL 150 mg 24 hr tablet Take 1 tablet (150 mg total) by mouth every morning.      albuterol 2.5 mg /3 mL (0.083 %) nebulizer solution Inhale 3 mL (2.5 mg total) by nebulization every four (4) hours as needed for wheezing. (Patient not taking: Reported on 08/21/2022) 180 mL 2    blood-glucose meter kit Use as directed 1 each 0    chlorhexidine (PERIDEX) 0.12 % solution 15 mL by Oromucosal route two (2) times a day. (Patient not taking: Reported on 12/31/2022) 900 mL 3    montelukast (SINGULAIR) 10 mg tablet Take 1 tablet (10 mg total) by mouth daily. 90 tablet 3    semaglutide 1 mg/dose (2 mg/1.5 mL) PnIj Inject 1 mg under the skin every seven (7) days. 3 mL 2     No current facility-administered medications for this visit.       Health Maintenance:     Health Maintenance   Topic Date Due    COVID-19 Vaccine (3 - Pfizer risk series) 01/24/2021    Retinal Eye Exam  10/31/2022    Serum Creatinine Monitoring  01/16/2023    Potassium Monitoring  01/16/2023    Hemoglobin A1c  06/04/2023    DTaP/Tdap/Td Vaccines (4 - Td or Tdap) 07/17/2023    Foot Exam  11/25/2023    Urine Albumin/Creatinine Ratio  11/26/2023    Pneumococcal Vaccine 0-64  Completed    Hepatitis C Screen  Completed    Influenza Vaccine  Completed       Immunizations:     Immunization History   Administered  Date(s) Administered    COVID-19 VACC,MRNA,(PFIZER)(PF) 12/04/2020, 12/27/2020    HEPATITIS B VACCINE ADULT,IM(ENERGIX B, RECOMBIVAX) 03/05/2013, 04/05/2013, 09/03/2013    Hepatitis B Vaccine, Unspecified Formulation 12/27/1999, 04/29/2000    INFLUENZA INJ MDCK PF, QUAD,(FLUCELVAX)(89MO AND UP EGG FREE) 03/31/2019    INFLUENZA TIV (TRI) PF (IM) 10/08/2011    INFLUENZA VACCINE IIV3(IM)(PF)6 MOS UP 03/05/2023    Influenza Vaccine Quad(IM)6 MO-Adult(PF) 03/05/2013, 03/18/2014, 03/22/2015, 03/05/2016, 03/19/2017, 02/23/2018, 02/19/2022    Influenza Virus Vaccine, unspecified formulation 03/19/2017, 02/23/2018, 03/25/2019, 03/10/2020, 04/05/2021    PNEUMOCOCCAL POLYSACCHARIDE 23-VALENT 12/30/2012    PPD Test 10/28/2016    Pneumococcal Conjugate 20-valent 01/02/2022    TD(TDVAX),ADSORBED,2LF(IM)(PF) 04/29/2000    TdaP 07/18/2008, 07/16/2013     I have reviewed and (if needed) updated the patient's problem list, medications, allergies, past medical and surgical history, social and family history.     Vital Signs:     Wt Readings from Last 3 Encounters:   12/31/22 (!) 209.4 kg (461 lb 11.2 oz)   11/26/22 (!) 208.2 kg (459 lb)   10/14/22 (!) 213.6 kg (471 lb)     Temp Readings from Last 3 Encounters:   03/05/23 36.3 ??C (97.3 ??F)   12/31/22 36.9 ??C (98.5 ??F) (Temporal)   11/26/22 36.7 ??C (98.1 ??F) (Oral)     BP Readings from Last 3 Encounters:   03/05/23 118/76   12/31/22 140/77   11/26/22 128/82     Pulse Readings from Last 3 Encounters:   03/05/23 62   12/31/22 88   11/26/22 71     Estimated body mass index is 64.39 kg/m?? as calculated from the following:    Height as of 12/31/22: 180.3 cm (5' 11).    Weight as of 12/31/22: 209.4 kg (461 lb 11.2 oz).  No height and weight on file for this encounter.      Objective:      General: Alert and oriented x3. Well-appearing. No acute distress. Morbid obesity BMI > 64.  HEENT:  Normocephalic.  Atraumatic. Conjunctiva and sclera normal.   Neck:  Supple.   Heart:  Regular rate and rhythm. Normal S1, S2. No murmurs, rubs or gallops. Distant heart sounds due to body habitus  Lungs:  No respiratory distress.  Lungs clear to auscultation. No wheezes, rhonchi, or rales.   EXT: Mild lymphedema bilateral  Skin:  Warm, dry. No rash or lesions present on visible skin.  Neuro:  Non-focal. No obvious weakness.   Psych:  Affect normal, eye contact good, speech clear and coherent.        I attest that I, Arta Bruce, personally documented this note while acting as scribe for [name], FNP.      Arta Bruce, Scribe.  03/05/2023     The documentation recorded by the scribe accurately reflects the service I personally performed and the decisions made by me.     [name], FNP

## 2023-03-07 DIAGNOSIS — E1165 Type 2 diabetes mellitus with hyperglycemia: Principal | ICD-10-CM

## 2023-03-07 DIAGNOSIS — Z794 Long term (current) use of insulin: Principal | ICD-10-CM

## 2023-03-07 MED ORDER — TESTOSTERONE CYPIONATE 200 MG/ML INTRAMUSCULAR OIL
INTRAMUSCULAR | 0 refills | 0 days
Start: 2023-03-07 — End: ?

## 2023-03-07 MED ORDER — LANTUS U-100 INSULIN 100 UNIT/ML SUBCUTANEOUS SOLUTION
Freq: Every evening | SUBCUTANEOUS | 0 refills | 0.00000 days | Status: CP
Start: 2023-03-07 — End: 2024-03-06

## 2023-03-11 MED ORDER — TESTOSTERONE CYPIONATE 200 MG/ML INTRAMUSCULAR OIL
INTRAMUSCULAR | 5 refills | 28 days
Start: 2023-03-11 — End: ?

## 2023-03-12 MED ORDER — TESTOSTERONE CYPIONATE 200 MG/ML INTRAMUSCULAR OIL
INTRAMUSCULAR | 5 refills | 28 days | Status: CP
Start: 2023-03-12 — End: ?

## 2023-03-14 MED ORDER — OZEMPIC 0.25 MG OR 0.5 MG (2 MG/3 ML) SUBCUTANEOUS PEN INJECTOR
1 refills | 0 days
Start: 2023-03-14 — End: ?

## 2023-03-18 ENCOUNTER — Ambulatory Visit: Admit: 2023-03-18 | Discharge: 2023-03-19 | Payer: MEDICARE

## 2023-03-18 DIAGNOSIS — E291 Testicular hypofunction: Principal | ICD-10-CM

## 2023-03-18 MED ORDER — SYRINGE WITH NEEDLE 3 ML 21 GAUGE X 1 1/2"
0 refills | 0 days | Status: CP
Start: 2023-03-18 — End: ?

## 2023-03-18 MED ORDER — SYRINGE WITH NEEDLE 3 ML 23 GAUGE X 1 1/2"
0 refills | 0 days | Status: CP
Start: 2023-03-18 — End: ?

## 2023-03-18 NOTE — Unmapped (Addendum)
Recheck labs on a Monday for a 53month refill  Continue daily cialis 5mg   Hypogonadism - Patient Information  Terressa Koyanagi, MD      Introduction  Hypogonadism is a condition defined by low serum testosterone levels and symptoms such as fatigue, decreased libido, erectile dysfunction, weakness, and weight gain. It is known to occur with aging, as most men have declining testosterone levels beginning in their 30???s, but it can also be associated with particular medical conditions. Treatment is usually undertaken when these underlying conditions are causing the issue, rather than just low levels associated with aging. Treatment is important for these men because, in addition to symptoms, low testosterone levels have been associated with decreased muscle mass and strength, osteoporosis, depression, decreased cognition, ED, and metabolic syndrome. Testosterone replacement therapy (TRT) has been shown to increase lean body mass, improve bone mineral density, increase cognitive performance, and improve sexual function.     Diagnosis  Currently, there is no single consensus statement regarding diagnosis and management of hypogonadism. In general, the diagnosis requires a low serum testosterone level measured in the morning hours coupled with at least one clinical symptom of low testosterone. Absolute ranges of normal testosterone levels are difficult to establish. Therefore, treatment is generally geared toward improvement of clinical symptoms rather than an absolute serum testosterone level.     Treatment options  There are many options for TRT, each of which has its benefits and disadvantages. The decision about which one is right for you will depend on your personal preferences and a discussion with your provider. In some cases, different insurance companies may cover one option and not another, which may also be taken into consideration. If the desired effects are not achieved with your initial choice, a different option can be tried to see if it is a better fit for you. A summary of the most commonly used TRT options is provided below.    Topical Gels: Advantages include more constant levels with daily dosing, high patient satisfaction, and avoidance of needles. Disadvantages include increased cost compared to injectables, the potential for transference of the gel to others (e.g., spouses and young children) through contact with your skin or clothes, messiness of gel application, and potential skin irritation.     Injectables: Advantages include efficacy and patient satisfaction, weekly to biweekly dosing, and low cost. Disadvantages include increased fluctuation (peaks and valleys) in testosterone levels compared to daily dosing options and the requirement for needles and self-injection. There is a higher risk of elevation of the hematocrit (red blood cell count) which would require dosing changes or blood donation.    Implantable (Testopel): The advantages of this therapy include convenience and decreased frequency of dosing. As this requires a short office procedure, there are risks including bleeding, infection, and pellet extrusion in less than 1% of cases.    Monitoring  Regardless of the type of testosterone replacement therapy chosen, you will need to be monitored at regular intervals (usually every 3-6 months); both to confirm good control of your symptoms and to ensure that there are no potentially dangerous side effects. The follow-up regimen usually consists of the following:  1. Physical examination, including digital rectal exam to rule out prostate nodules (yearly).  2. Routine blood work for testosterone levels and other hormones (every 3-6 months).  3. Routine blood work for lipids, hemoglobin and hematocrit, and PSA (prostate-specific antigen) (every 6 months).    Risks  Historically, the concern for TRT was its effect on the prostate. Despite  evidence that the prostate does enlarge slightly on TRT, no studies have shown any significant worsening of urinary symptoms while on therapy. Studies have also demonstrated no significant change in PSA while on therapy. An increasing PSA while on TRT may indicate underlying malignancy and warrants evaluation. There has been no increased risk of prostate cancer demonstrated with TRT. Additionally, studies have demonstrated no increased risk of recurrence in men on TRT after undergoing treatment for prostate cancer. Small studies of men with active prostate cancer have shown no progression of disease on TRT. Until larger studies are published, TRT should be avoided in men with known or suspected active prostate cancer.    Another major risk to be aware of is the almost certain risk to a man???s fertility. Men of reproductive age or men who may want to preserve their fertility for the future should not use testosterone. There are other medications and treatment options for these men, so be sure to discuss this with Dr. Rolan Lipa if you are concerned about preserving your fertility. Testosterone almost uniformly lowers sperm counts, sometimes to zero counts, and may irreversibly affect a man???s fertility.        Understanding the Cardiovascular Risk of Testosterone Replacement  Lenice Pressman, MD. The Journal of Sexual Medicine Volume 11, Issue 6, Article first published online: 13 Nov 2012    Two recent articles published in medical journals have suggested that men who received testosterone prescriptions were at greater risk of developing cardiovascular problems such as heart attack, stroke, and even death. Although these articles prompted a great amount of media attention, after careful scientific review many prominent experts on testosterone therapy have concluded that neither of these studies provides any definite evidence of cardiac risk from testosterone therapy. On the contrary, a rich literature spanning more than 30 years strongly suggests that increased risk of cardiovascular events is associated with low levels of testosterone; furthermore, testosterone therapy may provide cardiovascular benefits. Despite this, all patients should know that, based on these studies, the FDA has released a warning that there is a possible increased cardiovascular risk associated with testosterone use.    The first article was published in the Journal of the American Medical Association (JAMA) in November 2013 [1]. It analyzed data obtained from men who had undergone cardiac catheterization for possible heart problems, and identified men with low levels of testosterone. Some of those men eventually received a prescription for testosterone, and others did not. The investigators looked back at several years of data on these groups to determine rates of heart attacks, strokes, and deaths. They reported that although there was no significant difference in rates of these events at 1 year, 2 years, or 3 years, the overall trend over the course of the study showed a 29% increase in men who received testosterone prescriptions.    Strangely, the actual percentage of individuals who had any of these CV events was lower by half among the men who received T therapy (10.1%) compared with untreated men (21.2%). The investigators came to the opposite conclusion by complex statistics in which an event in a testosterone-treated man was given more ???weight??? than the same event in an untreated man. Moreover, the authors improperly excluded a large number of men who started testosterone after they had a heart attack or other event. If those men had been included in the ???no T??? group (which would have been appropriate as they were not taking T at the time of the event), the study would likely have  concluded that men who received testosterone were less likely to have cardiac events or strokes. The validity of this study has been questioned further due to new revelations of large errors in data. A large group of world experts have called for retraction of the study, concluding that these errors make the study ???no longer credible.    The second article was published in the journal PLoS One in January 2014 [2]. It analyzed health insurance data such as diagnosis codes and prescriptions, and reported that men who received a testosterone prescription had a greater risk of non-fatal heart attack in the short period (up to 90 days) after receiving the prescription than in the prior 12 months. Although the media made a big deal out of this study, the actual increased risk was just over 1 event for every 1,000 years of testosterone use. Such a tiny number is meaningless. More importantly, since the information was not obtained as part of any experiment, it is impossible to know whether this miniscule increase in risk was due to the prescription, or the underlying condition of low testosterone, or to other factors, such as age or smoking.    Cardiovascular Safety  Two previous studies have shown that men with low levels of testosterone who received treatment had a reduction in mortality by half compared with untreated men [3,4]. Numerous studies have shown that men with low levels of testosterone die sooner than men with normal testosterone levels [5]. Placebo-controlled studies have shown that men with congestive heart failure treated with testosterone were able to walk farther during testing than men who received placebo. Men with known coronary artery disease who received testosterone therapy were able to exercise longer on a treadmill without chest pain than men who received placebo. Increased ability to exercise may help prevent heart problems from progressing in some men. In addition, testosterone therapy improves risk factors for heart disease, such as reducing fat mass, lowering blood sugar, and reducing waist circumference. Although definitive large studies are yet to be completed, there is a great deal of evidence which strongly suggests that having a normal testosterone level, naturally or with T therapy, is beneficial for cardiovascular health.    If the Studies Linking T to Heart Problems Were So Weak, Why Did They Receive So Much Attention?  Unfortunately, there are many reasons why journal articles receive media attention, and it is not always on the strength of the science. Sensational headlines about risks of death or disease are always of interest, particularly when they are about a therapy (such as testosterone) that many people are using or are interested in using.    References:  1 Vigen R, O???Donnell CI, Bar??n AE, [address] [place], [address], [address], [place], [place], [place], [place], [place] PM. Association of testosterone therapy with mortality, myocardial infarction, and stroke in men with low  testosterone levels. JAMA 2013;310:1829-36.  2 Finkle WD, [place] S, [place], [place], [place], [place], Wylie RN. Increased risk of non-fatal myocardial infarction following testosterone therapy prescriptions in men. PLoS ONE 2014;9:e85805.  3 Shores MM, Smith NL, Forsberg CW, Anawalt BD, Matsumoto AM. Testosterone treatment and mortality in men with low testosterone levels. J Clin Endocrinol Metab 2012;97:2050-8.  4 Muraleedharan Theodoro Parma, Kapoor D, Ch[place] Gilford [facility]. Testosterone deficiency is associated with increased risk of mortality and testosterone  replacement improves survival in men with type 2 diabetes. Eur J Endocrinol 2013  ;169:725-33.  5 Thornell Mule  AM, Miner MM, Morgentaler A, Zitzmann M. Testosterone deficiency. Am J Med [id number].  6 Carlos American, Clent Ridges, Morgentaler A. A new era of testosterone and prostate cancer: From physiology to clinical implications. Eur Urol [phone].      The AUA Position Statement On Testosterone Therapy (February 2014)  [email or web address]    The American Urological Association (AUA) has followed closely the recent media attention regarding reports that testosterone therapy increases cardiovascular events in men as well as the FDA's stated intent to review cardiovascular risk with this treatment in men with hypogonadism. The AUA notes there is also contradictory evidence suggesting a beneficial influence of testosterone therapy on cardiovascular risk. Definitive studies have not been performed.    The AUA is also concerned about the potential for misuse of testosterone for non-medical indications, such as body building or performance enhancement.    Hypogonadism is defined as biochemically low testosterone levels in the setting of a cluster of symptoms which may include reduced sexual desire (libido) and activity, decreased spontaneous erections, decreased energy and depressed mood. Men with hypogonadism may also experience reduced muscle mass and strength and increased body fat. Hypogonadism may also contribute to reduced bone mineral density and anemia. Testosterone therapy is an appropriate treatment for hypogonadism after full discussion of potential adverse effects. Treatment requires follow-up and medical monitoring. Testosterone therapy in the absence of hypogonadism is not appropriate.    Increased awareness about hypogonadism has been stimulated by an increase in availability and diversity of patient-acceptable forms of testosterone replacement options in recent years. The management of hypogonadism should start with careful evaluation by a physician experienced in diagnosing hypogonadism. Many of the symptoms are non-specific and may be multifactorial in origin. Hence, symptoms may not be necessarily linked to hypogonadism alone. This fact needs to be considered in the overall evaluation.    The diagnosis and management of testosterone deficiency should be made by a physician with training in the condition and its treatments. The diagnosis should be made only after taking detailed medical history, physical examination, and obtaining appropriate blood tests. Testosterone therapy should not be offered to men with normal testosterone levels. Testosterone therapy is never a treatment for infertility.    The potential adverse effects of testosterone therapy should be discussed prior to treatment. These include acne, breast swelling or tenderness, increased red blood cell count, swelling of the feet or ankles, reduced testicular size and infertility. Current evidence does not provide any definitive answers regarding the risks of testosterone therapy on prostate cancer and cardiovascular disease, and patients should be so informed.    The optimal follow-up of men on testosterone therapy has not been defined, but should include measurement of testosterone level, PSA and hematocrit. Other patient-specific measures may be appropriate.    The AUA recognizes and encourages the need for increased educational awareness of the benefits and risks of testosterone therapy among both patients and health care providers.      FDA Drug Safety Communication: FDA cautions about using testosterone products for low testosterone due to aging; requires labeling change to inform of possible increased risk of heart attack and stroke with use (08/10/2013)    The U.S. Food and Drug Administration (FDA) cautions that prescription testosterone products are approved only for men who have low testosterone levels caused by certain medical conditions. The benefit and safety of these medications have not been established for the treatment of low testosterone levels due to aging, even if a man???s symptoms seem related  to low testosterone. We are requiring that the manufacturers of all approved prescription testosterone products change their labeling to clarify the approved uses of these medications. We are also requiring these manufacturers to add information to the labeling about a possible increased risk of heart attacks and strokes in patients taking testosterone. Health care professionals should prescribe testosterone therapy only for men with low testosterone levels caused by certain medical conditions and confirmed by laboratory tests.    Testosterone is FDA-approved as replacement therapy only for men who have low testosterone levels due to disorders of the testicles, pituitary gland, or brain that cause a condition called hypogonadism. Examples of these disorders include failure of the testicles to produce testosterone because of genetic problems, or damage from chemotherapy or infection. However, FDA has become aware that testosterone is being used extensively in attempts to relieve symptoms in men who have low testosterone for no apparent reason other than aging. The benefits and safety of this use have not been established.    In addition, based on the available evidence from published studies and expert input from an Advisory Committee meeting, FDA has concluded that there is a possible increased cardiovascular risk associated with testosterone use. These studies included aging men treated with testosterone. Some studies reported an increased risk of heart attack, stroke, or death associated with testosterone treatment, while others did not.    Based on our findings, we are requiring labeling changes for all prescription testosterone products to reflect the possible increased risk of heart attacks and strokes associated with testosterone use. Health care professionals should make patients aware of this possible risk when deciding whether to start or continue a patient on testosterone therapy. We are also requiring manufacturers of approved testosterone products to conduct a well-designed clinical trial to more clearly address the question of whether an increased risk of heart attack or stroke exists among users of these products. We are encouraging these manufacturers to work together on a clinical trial, but they are allowed to work separately if they so choose.    Patients using testosterone should seek medical attention immediately if symptoms of a heart attack or stroke are present, such as:    Chest pain  Shortness of breath or trouble breathing  Weakness in one part or one side of the body  Slurred speech

## 2023-03-18 NOTE — Unmapped (Signed)
[facility] Refill Coordination Note    Specialty Medication(s) to be Shipped:   Inflammatory Disorders: Humira    Other medication(s) to be shipped: No additional medications requested for fill at this time     Logan Quinn., DOB: July 05, 1980  Phone: There are no phone numbers on file.      All above HIPAA information was verified with patient. And spouse    Was a Nurse, learning disability used for this call? No    Completed refill call assessment today to schedule patient's medication shipment from the [facility]  [phone]).  All relevant notes have been reviewed.     Specialty medication(s) and dose(s) confirmed: Regimen is correct and unchanged.   Changes to medications: Logan Quinn reports no changes at this time.  Changes to insurance: No  New side effects reported not previously addressed with a pharmacist or physician: None reported  Questions for the pharmacist: No    Confirmed patient received a Conservation officer, historic buildings and a Surveyor, mining with first shipment. The patient will receive a drug information handout for each medication shipped and additional FDA Medication Guides as required.       DISEASE/MEDICATION-SPECIFIC INFORMATION        For patients on injectable medications: Patient currently has maybe 1 doses left.  Next injection is scheduled for 10/11.    SPECIALTY MEDICATION ADHERENCE     Medication Adherence    Patient reported X missed doses in the last month: 0  Specialty Medication: adalimumab (HUMIRA,CF, PEN) 80 mg/0.8 mL PnKt  Patient is on additional specialty medications: No  Informant: patient                Were doses missed due to medication being on hold? No    humira 80/0.8 mg/ml: 3 days of medicine on hand       REFERRAL TO PHARMACIST     Referral to the pharmacist: Not needed      [facility]     Shipping address confirmed in Epic.       Delivery Scheduled: Yes, Expected medication delivery date: 10/11.     Medication will be delivered via Same Day Courier to the prescription address in Epic WAM.    [facility]

## 2023-03-18 NOTE — Unmapped (Signed)
UROLOGY CLINIC NOTE     Patient Name: Logan Quinn.  Patient Age: 42 y.o.  Encounter Date: 03/18/2023    Referring Provider:   Self, Referred  No address on file    PCP:   Loran Senters, FNP    Reason for Visit:   Chief Complaint   Patient presents with    Hypogonadism in male     Assessment  42 y.o. male with diabetes, obesity, OSA on CPAP  Hypogonadism/Low Libido    Held on TRT due to fertility concerns, now done with children  On 0.50ml (80mg ) weekly (Thursdays) + anastrazole every other day. Reporting symptoms improved on therapy. Occasional drops of libido, but overall stable.  Reviewed 08/2022 labs; T619, estradiol 52.3, hematocrit 44.5.  Will repeat labs on a Monday. If stable, provide 6 month refills.    Counseling of Testosterone Replacement Therapy (TRT):  Options include observation with implementation of dietary/exercise strategies versus testosterone replacement (with gel, injections, pellets, etc) and alternative therapies such as Clomid and/or Arimidex to increase natural production. Losing weight is a means of naturally improving testosterone levels. According to findings presented at the annual meeting of the Endocrine Society in 2012, obese men who lost an average of 17 pounds saw their testosterone levels increase by 15 percent. Some studies suggest dietary modifications including drinking less alcohol, and eating a well-balanced diet with lots of vegetables can also help.  Also stress management and improved sleep habits.     Risks of TRT including but not limited to acne, fluid retention, BPH or urinary symptoms, breast enlargement, worsening of sleep apnea, infertility, changes in blood count requiring phlebotomy, changes in cholesterol requiring additional medical therapy, blood clots including deep venous thrombosis or thromboembolic events, and prostate cancer progression. We also discussed recent literature suggesting an increase in cardiovascular events (including heart attack/stroke) and mortality, but we also reviewed that there is conflicting literature in this regard.      We discussed that he will need to be followed at regular intervals for physical exam, reassessment of symptoms, and lab work including hormone level and hematocrit. Also discussed PSA (last checked in 2019; patient plans to defer until 51). After being fully advised of these risks and expressing understanding, he would like to continue therapy. He was given a handout delineating the importance of regular follow-up and risks of testosterone replacement therapy, including those noted above.        Plan:  - Continue Testosterone cypionate 200 mg/ ml: 0.4 mL (80mg ) weekly  - Continue Anastrazole every other day (M, W, F)  - Continue daily cialis 5mg   - Recheck labs on Monday - if stable, will refill  -  Hormone profile today and again in 6 months (hematocrit, estradiol, testosterone)    If labs stable, follow-up in 6 months to see how he is doing.      HPI:   Logan Quinn is a 42 y.o. male with diabetes, obesity, OSA on CPAP, here for follow-up of ED and hypogonadism    Previously followed by Dr. Gailen Shelter decline in erectile function and libido   Noted prior low T values of 105 and 122   Held on TRT due to fertility concerns, now done with children and was interested in TRT.     T cyp 100mg  IM weekly initiated 02/2022, T bumped to 911, so reduced to 80mg  (0.70ml) weekly  T 08/2022 619  Taking 80mg  (0.64ml) on Thursdays  Anastrazole 1mg  po  every other day (taking M, W, F)    Mostly satisfied but notes some drops of libido. Overall much improvement from baseline.    ED   On daily m5g cialis, works great, no concerns    Paternal grandfather had metastatic bone cancer  Maternal grandfather had either stomach or kidney cancer.    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    Liver disease     Migraines     Mild intermittent asthma without complication 10/11/2021    Morbid obesity with BMI of 60.0-69.9, adult (CMS-HCC)     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)     Obstructive sleep apnea     OSA on CPAP     Retinal detachment 04/2014    Severe obstructive sleep apnea     Trapezius muscle strain 12/07/2013    Urinary incontinence, nocturnal enuresis     Venous insufficiency        Past Surgical History:  Past Surgical History:   Procedure Laterality Date    EYE SURGERY  04/2014    LASER ABLATION INCOMPETENT VEIN INI [facility] HISTORICAL RESULT) Right     PR COLONOSCOPY FLX DX W/COLLJ SPEC WHEN PFRMD N/A 03/24/2013    Procedure: COLONOSCOPY, FLEXIBLE, PROXIMAL TO SPLENIC FLEXURE; DIAGNOSTIC, W/WO COLLECTION SPECIMEN BY BRUSH OR WASH;  Surgeon: Clint Bolder, MD;  [facility]: [facility];  Service: Gastroenterology    PR EYE SURG POST SGMT PROC UNLISTED Left     pneumatic retinopexy OS    PR UPPER GI ENDOSCOPY,DIAGNOSIS N/A 02/02/2013    Procedure: UGI ENDO, INCLUDE ESOPHAGUS, STOMACH, & DUODENUM &/OR JEJUNUM; DX W/WO COLLECTION SPECIMN, BY BRUSH OR WASH;  Surgeon: Malcolm Metro, MD;  Location: [facility];  Service: Gastroenterology    SKIN BIOPSY      [place] PYLORIC STENOSIS ([place] HISTORICAL RESULT)          Social History:  Patient  reports that he has never smoked. He has never been exposed to tobacco smoke. He has never used smokeless tobacco. He reports that he does not drink alcohol and does not use drugs. Married, 1 child    Family History:  The patient's family history includes Angina in his maternal grandmother and mother; Arthritis in his mother and paternal grandmother; Asthma in his brother, brother, and sister; COPD in his mother and paternal grandmother; Cancer in his maternal grandfather and paternal grandfather; Cataracts in his mother; Depression in his maternal grandmother, mother, paternal grandmother, and sister; Diabetes in his brother, maternal grandmother, maternal uncle, maternal uncle, mother, and sister; GER disease in his mother; Glaucoma in his mother; Hearing loss in his maternal grandfather, maternal uncle, mother, and sister; Heart disease in his maternal grandmother and mother; Hypertension in his maternal grandmother and mother; Kidney disease in his maternal uncle; Liver disease in his maternal uncle; Migraines in his maternal grandmother, mother, and  sister; No Known Problems in his father, paternal aunt, paternal uncle, and another family member; Stroke in his maternal grandmother.     Medications:  Current Outpatient Medications   Medication Sig Dispense Refill    adalimumab (HUMIRA,CF, PEN) 80 mg/0.8 mL PnKt Inject 80 mg subcutaneously once every week 4 each 11    albuterol HFA 90 mcg/actuation inhaler Inhale 2 puffs every six (6) hours as needed for wheezing. 8.5 g 5    allopurinol (ZYLOPRIM) 100 MG tablet Take 2 tablets (200 mg total) by mouth daily. 180 tablet 1    ALPRAZolam (XANAX) 0.5 MG tablet Takes 1 tablet nightly and 1 tablet a day when needed      anastrozole (ARIMIDEX) 1 mg tablet Take 1 tablet (1 mg total) by mouth every other day. 45 tablet 3    ascorbic acid (VITAMIN C ORAL) Take 1 capsule by mouth nightly.      atorvastatin (LIPITOR) 20 MG tablet Take 1 tablet (20 mg total) by mouth daily. 90 tablet 3    BD LUER-LOK SYRINGE 3 mL 21 gauge x 1 1/2 Syrg weekly      blood sugar diagnostic (ACCU-CHEK GUIDE TEST STRIPS) Strp USE TO CHECK BLOOD SUGAR 3 TIMES DAILY BEFORE MEALS      budesonide-formoterol (SYMBICORT) 80-4.5 mcg/actuation inhaler Inhale 2 puffs two (2) times a day. 10.2 g 11    chlorhexidine (PERIDEX) 0.12 % solution 15 mL by Oromucosal route two (2) times a day. 900 mL 3    clindamycin (CLEOCIN T) 1 % lotion Apply topically two (2) times a day. To affected areas on body as needed for flares 60 mL 3    clobetasoL (TEMOVATE) 0.05 % ointment Apply daily to painful affected areas if needed for flares, then stop 15 g 1    colchicine (MITIGARE) 0.6 mg cap capsule Take 1 capsule (0.6 mg total) by mouth daily. 90 capsule 0    cyclobenzaprine (FLEXERIL) 10 MG tablet Take 1 tablet (10 mg total) by mouth Three (3) times a day as needed for muscle spasms. 60 tablet 1    dextroamphetamine sulfate (DEXTROSTAT) 10 MG tablet Take 2 tablets (20 mg total) by mouth two (2) times a day.      dicyclomine (BENTYL) 10 mg capsule Take 1 capsule (10 mg total) by mouth Four (4) times a day (before meals and nightly). 120 capsule 1    diphenoxylate-atropine (LOMOTIL) 2.5-0.025 mg per tablet Take 1 tablet by mouth four (4) times a day as needed for diarrhea. 30 tablet 1    DULoxetine (CYMBALTA) 60 MG capsule Take 1 capsule (60 mg total) by mouth Two (2) times a day. 180 capsule 0    empty container Misc Use as directed to dispose of needles. When full, make sure lid is closed tightly then dispose of container in trash. 1 each 2    erenumab-aooe (AIMOVIG AUTOINJECTOR) 140 mg/mL AtIn Inject 1 Pen under the skin every thirty (30) days. 3 mL 1    famotidine (PEPCID) 20 MG tablet Take 1 tablet (20 mg total) by mouth two (2) times a day. 180 tablet 3    fluocinonide (LIDEX) 0.05 % gel Apply 1 Application topically Three (3) times a day.      fluoride, sodium, 1.1 % Pste       fluticasone propionate (FLONASE) 50 mcg/actuation nasal spray 1 spray into each nostril two (2) times a day. 16 g 5    glipiZIDE (GLUCOTROL) 10 MG tablet Take 2 tablets (20  mg total) by mouth Two (2) times a day (30 minutes before a meal). 360 tablet 3    HUMALOG U-100 INSULIN 100 unit/mL injection INJECT 120 UNITS UNDER THE SKIN DAILY VIA OMNIPOD 40 mL 5    hydrocortisone (PROCTOSOL HC) 2.5 % rectal cream Insert 1 Application into the rectum two (2) times a day as needed. 28.35 g 2    hydrOXYzine (ATARAX) 25 MG tablet Take 1 tablet (25 mg total) by mouth Three (3) times a day as needed. May take two before bed for sleep. 120 tablet 1    ibuprofen (ADVIL,MOTRIN) 800 MG tablet Take 1 tablet (800 mg total) by mouth every eight (8) hours as needed. 90 tablet 1    insulin glargine (LANTUS U-100 INSULIN) 100 unit/mL injection Inject 0.05 mL (5 Units total) under the skin nightly. 10 mL 0    insulin pump cart,automated,BT (OMNIPOD 5 G6 PODS, GEN 5,) Crtg Change POD every 48 hours. 3 each 11    insulin syringe-needle U-100 (BD INSULIN SYRINGE ULTRA-FINE) 1 mL 31 gauge x 5/16 (8 mm) Syrg Once daily for insulin dosing. 100 each 1    insulin syringe-needle U-100 0.3 mL 31 gauge x 5/16 (8 mm) Syrg USE DAILY WITH LANTUS INJECTIONS      insulin syringe-needle U-100 1 mL 31 gauge x 5/16 (8 mm) Syrg USE AS DIRECTED ONE TIME DAILY FOR INSULIN DOSING      ketoconazole (NIZORAL) 2 % cream Apply 1 Application topically daily. To affected area on body until clear 60 g 3    lancets (ACCU-CHEK FASTCLIX LANCET DRUM) Misc Use to check blood sugars 3 times a day before meals or as directed 200 each 5    lancets (ACCU-CHEK FASTCLIX LANCET DRUM) Misc USE TO CHECK BLOOD SUGAR THREE TIMES DAILY BEFORE MEALS OR AS DIRECTED      levothyroxine (SYNTHROID) 50 MCG tablet Take 1 tablet (50 mcg total) by mouth daily. 90 tablet 1    lidocaine (XYLOCAINE) 5 % ointment Apply topically Three (3) times a day as needed. 30 g 1    LIDOCAINE 2 % solution Swish and spit three times a day as needed      losartan (COZAAR) 25 MG tablet Take 1 tablet (25 mg total) by mouth daily. 90 tablet 3    montelukast (SINGULAIR) 10 mg tablet Take 1 tablet (10 mg total) by mouth daily. 90 tablet 3    multivitamin (MULTIPLE VITAMIN ORAL) Take 1 tablet by mouth daily.      ondansetron (ZOFRAN) 4 MG tablet Take 1 tablet (4 mg total) by mouth every eight (8) hours as needed for nausea. 30 tablet 1    oxybutynin (DITROPAN) 5 MG tablet Take 1 tablet (5 mg total) by mouth two (2) times a day. 180 tablet 1    pantoprazole (PROTONIX) 40 MG tablet Take 1 tablet (40 mg total) by mouth two (2) times a day. 180 tablet 1    polyethylene glycol (GLYCOLAX) 17 gram/dose powder       propranoloL (INDERAL LA) 120 mg 24 hr capsule Take 1 capsule (120 mg total) by mouth daily. 90 capsule 1    ramelteon (ROZEREM) 8 mg tablet Take 1 tablet (8 mg total) by mouth nightly.      safety needles (BD SAFETYGLIDE NEEDLE) 18 gauge x 1 1/2 Ndle USE ONE NEEDLE ONCE WEEKLY      safety needles (BD SAFETYGLIDE NEEDLE) 23 gauge x 1 Ndle 1 Units by Miscellaneous route once a week. 12  each 11    safety needles 18 gauge x 1 1/2 Ndle 1 Units by Miscellaneous route once a week. 12 each 11    semaglutide 1 mg/dose (2 mg/1.5 mL) PnIj Inject 1 mg under the skin every seven (7) days. 3 mL 2    sour cherry extract (TART CHERRY EXTRACT) 1,000 mg cap       syringe with needle (BD LUER-LOK SYRINGE) 3 mL 21 gauge x 1 1/2 Syrg USE AS DIRECTED ONCE A WEEK      tadalafil (CIALIS) 5 MG tablet Take 1 tablet (5 mg total) by mouth in the morning. 90 tablet 3    testosterone cypionate (DEPOTESTOTERONE CYPIONATE) 200 mg/mL injection Inject 0.5 mL (100 mg total) into the muscle once a week. Inject 0.5cc (100mg ) weekly 2 mL 5    topiramate (TOPAMAX) 50 MG tablet Take 1.5 tablets (75 mg total) by mouth two (2) times a day. 270 tablet 3    WELLBUTRIN XL 150 mg 24 hr tablet Take 1 tablet (150 mg total) by mouth every morning.      albuterol 2.5 mg /3 mL (0.083 %) nebulizer solution Inhale 3 mL (2.5 mg total) by nebulization every four (4) hours as needed for wheezing. (Patient not taking: Reported on 08/21/2022) 180 mL 2    blood-glucose meter kit Use as directed 1 each 0     No current facility-administered medications for this visit.       Allergies:  Erythromycin, Penicillins, Sulfa (sulfonamide antibiotics), and Other     ROS:   As per HPI.  The patient was asked to review all abnormal responses not pertinent to today's visit with their primary care provider.     Vitals  BP 144/84  - Pulse 69  - Wt (!) 206.9 kg (456 lb 3.2 oz)  - BMI 63.63 kg/m??     Physical Exam:  GENERAL: The patient is a pleasant overweight male in no acute distress.   HEENT: Normocephalic and atraumatic.   NECK: Supple with trachea midline.   LYMPHATICS: No cervical or supraclavicular lymphadenopathy.   PULMONARY: Relaxed respiratory effort on room air.   CARDIOVASCULAR: Regular rate   GASTROINTESTINAL: Protruding, nondistended  GENITOURINARY:  SKIN: No signs of cyanosis or clubbing.   NEUROLOGICAL: Grossly intact.   PSYCH: Alert and oriented x 3.      Labs Reviewed:  Lab Results   Component Value Date    WBC 7.3 01/15/2022    HGB 14.3 01/15/2022    HCT 44.5 08/19/2022    PLT 187 01/15/2022       Lab Results   Component Value Date    NA 139 03/05/2023    K 3.9 03/05/2023    CL 106 03/05/2023    CO2 25.0 03/05/2023    BUN 9 03/05/2023    CREATININE 0.92 03/05/2023    CALCIUM 9.6 03/05/2023    MG 1.8 12/07/2012    PHOS 4.1 12/07/2012       Lab Results   Component Value Date/Time    PSA 0.35 04/22/2018 02:49 PM       Lab Results   Component Value Date/Time    TESTOSTERONE 619 08/19/2022 02:26 PM    TESTOSTERONE 911 (H) 05/13/2022 01:03 PM    TESTOSTERONE 105 (L) 01/09/2021 11:57 AM    TESTOSTERONE 122 (L) 03/24/2019 04:44 PM    TESTOSTERONE 124 (L) 04/22/2018 02:49 PM    TESTOSTERONE 84 (L) 04/07/2018 01:00 PM    TESTOSTERONE 92 (L) 04/01/2018 10:02 AM

## 2023-03-21 MED FILL — HUMIRA(CF) PEN 80 MG/0.8 ML SUBCUTANEOUS KIT: 28 days supply | Qty: 4 | Fill #8

## 2023-03-31 ENCOUNTER — Ambulatory Visit: Admit: 2023-03-31 | Discharge: 2023-04-01 | Payer: MEDICARE

## 2023-03-31 LAB — HEMATOCRIT: HEMATOCRIT: 47.4 % (ref 39.0–48.0)

## 2023-03-31 LAB — ESTRADIOL(ESTROGEN) LEVEL: ESTRADIOL LEVEL: 167.8 pg/mL

## 2023-03-31 LAB — TESTOSTERONE: TESTOSTERONE TOTAL: 1094 ng/dL — ABNORMAL HIGH

## 2023-04-01 DIAGNOSIS — Z794 Long term (current) use of insulin: Principal | ICD-10-CM

## 2023-04-01 DIAGNOSIS — E291 Testicular hypofunction: Principal | ICD-10-CM

## 2023-04-01 DIAGNOSIS — E1165 Type 2 diabetes mellitus with hyperglycemia: Principal | ICD-10-CM

## 2023-04-01 MED ORDER — ANASTROZOLE 1 MG TABLET
ORAL_TABLET | Freq: Every day | ORAL | 1 refills | 30 days | Status: CP
Start: 2023-04-01 — End: 2023-05-31

## 2023-04-01 MED ORDER — LANTUS U-100 INSULIN 100 UNIT/ML SUBCUTANEOUS SOLUTION
SUBCUTANEOUS | 0 refills | 200 days | Status: CP
Start: 2023-04-01 — End: 2024-03-30

## 2023-04-01 MED ORDER — HUMALOG U-100 INSULIN 100 UNIT/ML SUBCUTANEOUS SOLUTION
0 refills | 0 days | Status: CP
Start: 2023-04-01 — End: 2024-03-30

## 2023-04-01 NOTE — Unmapped (Signed)
Patient is requesting the following refill  Requested Prescriptions     Pending Prescriptions Disp Refills    LANTUS U-100 INSULIN 100 unit/mL injection [Pharmacy Med Name: LANTUS 100 UNIT/ML VIAL] 10 mL 0     Sig: INJECT 5 UNITS UNDER THE SKIN NIGHTLY    HUMALOG U-100 INSULIN 100 unit/mL injection [Pharmacy Med Name: HUMALOG 100 UNIT/ML VIAL] 40 mL 0     Sig: INJECT 120 UNITS UNDER THE SKIN DAILY VIA OMNIPOD       Recent Visits  Date Type Provider Dept   03/05/23 Office Visit Loran Senters, FNP [facility] [address] [facility]   11/26/22 Office Visit Loran Senters, FNP [facility] [address] [facility]   08/21/22 Office Visit Loran Senters, FNP [facility] [address] [facility]   07/31/22 Office Visit Loran Senters, FNP [facility] [address] At [facility]   07/02/22 Office Visit Loran Senters, FNP [facility] [address] [facility]   05/21/22 Office Visit Loran Senters, FNP [facility] [address] At [facility]   Showing recent visits within past 365 days and meeting all other requirements  Future Appointments  Date Type Provider Dept   06/13/23 Appointment Loran Senters, FNP [facility] [address] [facility]   Showing future appointments within next 365 days and meeting all other requirements       Labs: A1c:   Hemoglobin A1C (%)   Date Value   03/05/2023 7.9 (A)   09/15/2014 6.0    Creatinine:   Creatinine (mg/dL)   Date Value   16/03/9603 0.92   01/17/2014 0.74

## 2023-04-01 NOTE — Unmapped (Signed)
Attempted to call patient about hormone lab results (T and estradiol high, hematocrit stable at 47.4)  Will recommend adjusting dosage of medication and would like to discuss this with patient prior to providing refills.  Unable to leave voicemail; will try again later    Everlene Other, DNP, [facility]  Family Nurse Practitioner  Urology    April 01, 2023 9:08 AM

## 2023-04-01 NOTE — Unmapped (Signed)
Spoke to patient about hormone lab results: Elevated Testosterone (1094, previously 619 and 911), elevated estradiol (167.8, previously 52.3 and 148.4), hematocrit stable at 47.4    Discuss options for adjusting medications. Will increase anastrazole from every other day to daily to assess if lowers T and estradiol (patient reports this has worked in the past).    Will recheck labs in 1 month. If no improvement, will plan to reduce Testosterone from 0.4 to 0.70ml weekly    All questions were answered.   It was a pleasure speaking to [facility] today.    Everlene Other, DNP, FNP-C  Family Nurse Practitioner  Urology    April 01, 2023 1:23 PM

## 2023-04-02 NOTE — Unmapped (Signed)
Has UTI sx.  Appt with Dr. Haze Rushing tomorrow.  He only wants with [name].  He is aware we will call with any cancellations.

## 2023-04-03 DIAGNOSIS — G43909 Migraine, unspecified, not intractable, without status migrainosus: Principal | ICD-10-CM

## 2023-04-03 MED ORDER — AIMOVIG AUTOINJECTOR 140 MG/ML SUBCUTANEOUS AUTO-INJECTOR
SUBCUTANEOUS | 1 refills | 0 days
Start: 2023-04-03 — End: 2024-04-02

## 2023-04-03 NOTE — Unmapped (Signed)
Patient is requesting the following refill  Requested Prescriptions     Pending Prescriptions Disp Refills    erenumab-aooe (AIMOVIG AUTOINJECTOR) auto-injector 3 mL 1     Sig: Inject 1 Pen under the skin every thirty (30) days.       Recent Visits  Date Type Provider Dept   03/05/23 Office Visit Loran Senters, FNP [facility] [address] [facility]   11/26/22 Office Visit Loran Senters, FNP [facility] [address] [facility]   08/21/22 Office Visit Loran Senters, FNP [place] [facility] [address] [facility]   07/31/22 Office Visit Loran Senters, FNP [facility] [address] [facility]   07/02/22 Office Visit Loran Senters, FNP [place] [facility] [address] At [facility]   05/21/22 Office Visit Loran Senters, FNP [place] [facility] [address] [facility]   Showing recent visits within past 365 days and meeting all other requirements  Future Appointments  Date Type Provider Dept   06/13/23 Appointment Loran Senters, FNP [place] [facility] [address] [facility]   Showing future appointments within next 365 days and meeting all other requirements

## 2023-04-04 ENCOUNTER — Encounter: Payer: Self-pay | Admitting: Podiatry

## 2023-04-04 ENCOUNTER — Ambulatory Visit (INDEPENDENT_AMBULATORY_CARE_PROVIDER_SITE_OTHER): Payer: 59 | Admitting: Podiatry

## 2023-04-04 VITALS — Ht 71.0 in | Wt >= 6400 oz

## 2023-04-04 DIAGNOSIS — I890 Lymphedema, not elsewhere classified: Secondary | ICD-10-CM | POA: Diagnosis not present

## 2023-04-04 DIAGNOSIS — E119 Type 2 diabetes mellitus without complications: Secondary | ICD-10-CM

## 2023-04-04 DIAGNOSIS — M79676 Pain in unspecified toe(s): Secondary | ICD-10-CM | POA: Diagnosis not present

## 2023-04-04 DIAGNOSIS — B351 Tinea unguium: Secondary | ICD-10-CM

## 2023-04-04 DIAGNOSIS — E1142 Type 2 diabetes mellitus with diabetic polyneuropathy: Secondary | ICD-10-CM | POA: Diagnosis not present

## 2023-04-04 MED ORDER — AIMOVIG AUTOINJECTOR 140 MG/ML SUBCUTANEOUS AUTO-INJECTOR
SUBCUTANEOUS | 1 refills | 0 days | Status: CP
Start: 2023-04-04 — End: 2024-04-03

## 2023-04-04 NOTE — Progress Notes (Signed)
ANNUAL DIABETIC FOOT EXAM  Subjective: Jason Davidson presents today annual diabetic foot exam.  Chief Complaint  Patient presents with   Nail Problem    DFC, Pt is a diabetic, last A1C was 7.9, last office visit was earlier this month and PCP is Dr Ninetta Lights.   Patient confirms h/o diabetes.  Patient has h/o lower extremity wound.  Etheleen Nicks, NP is patient's PCP.  He is accompanied by his wife and son on today's visit.  Past Medical History:  Diagnosis Date   Asthma    Depressed    Diabetes mellitus without complication (HCC)    Gout    IBS (irritable bowel syndrome)    Morbid obesity (HCC)    Sleep apnea    Thyroid disease    Patient Active Problem List   Diagnosis Date Noted   Sprain of left ankle 07/03/2022   Tinea pedis 03/18/2022   Chronic pansinusitis 11/28/2021   Cough variant asthma 11/28/2021   Environmental and seasonal allergies 11/28/2021   Erectile dysfunction 11/28/2021   Nasal congestion 11/28/2021   Sinus congestion 11/28/2021   Mild intermittent asthma without complication 10/11/2021   Essential hypertension 10/10/2021   Acquired hypothyroidism 01/09/2021   Acute non-recurrent pansinusitis 07/19/2020   Hidradenitis suppurativa 04/29/2018   Allergic rhinitis 12/23/2017   Varicose veins of both lower extremities with pain 10/16/2017   IBS (irritable bowel syndrome) 06/18/2017   Chronic venous insufficiency 12/31/2016   Lymphedema 12/31/2016   Pain in joint, ankle and foot 12/31/2016   Diabetes (HCC) 12/31/2016   GERD (gastroesophageal reflux disease) 12/31/2016   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   H/O retinal detachment 10/01/2016   No diabetic retinopathy in both eyes 10/01/2016   Cellulitis of right lower extremity 06/19/2016   Chronic joint pain 11/16/2015   Skin infection 05/15/2015   Depressed    Tick bite 10/20/2014   Primary insomnia 07/08/2014   Candidal balanitis 02/16/2014   Skin tag  01/17/2014   Left shoulder pain 10/04/2013   Anxiety 09/13/2013   Diarrhea 09/13/2013   Bleeding external hemorrhoids 09/03/2013   NAFLD (nonalcoholic fatty liver disease) 16/03/9603   Headache 07/05/2013   Abdominal pain 03/05/2013   Morbid (severe) obesity due to excess calories (HCC) 03/05/2013   Suicidal ideation 02/09/2013   Obstructive sleep apnea (adult) (pediatric) 07/21/2012   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   Past Surgical History:  Procedure Laterality Date   COLONOSCOPY     ESOPHAGOGASTRODUODENOSCOPY ENDOSCOPY     EYE SURGERY     LASER ABLATION INCOMPETENT VEIN INI     No current facility-administered medications on file prior to visit.   Current Outpatient Medications on File Prior to Visit  Medication Sig Dispense Refill   Adalimumab 40 MG/0.4ML PNKT Inject into the skin.     AIMOVIG 70 MG/ML SOAJ   0   albuterol (VENTOLIN HFA) 108 (90 Base) MCG/ACT inhaler Inhale 1-2 puffs into the lungs every 6 (six) hours as needed for wheezing or shortness of breath. 18 g 0   ALPRAZolam (XANAX) 0.5 MG tablet Take 0.5 mg by mouth at bedtime as needed for anxiety.     anastrozole (ARIMIDEX) 1 MG tablet Take by mouth.     azelastine (ASTELIN) 0.1 % nasal spray Place into the nose.     clotrimazole-betamethasone (LOTRISONE) cream Apply 1 Application topically daily. 30 g 2   Colchicine 0.6 MG CAPS Take 1.2 mg on Day 1 of flare, followed by 0.6 mg one hour later.  Day 2 take 0.6 mg BID until flare resolves.     cyclobenzaprine (FLEXERIL) 10 MG tablet Take 1 tablet (10 mg total) by mouth 3 (three) times daily as needed. 15 tablet 0   dextroamphetamine (DEXTROSTAT) 10 MG tablet Take 20 mg by mouth 2 (two) times daily.     dicyclomine (BENTYL) 10 MG capsule Take 10 mg by mouth 3 (three) times daily before meals.     diphenoxylate-atropine (LOMOTIL) 2.5-0.025 MG tablet Take by mouth.     DULoxetine (CYMBALTA) 60 MG capsule Take 120 mg by mouth daily.      fluticasone (FLONASE) 50  MCG/ACT nasal spray Place 1 spray into both nostrils 2 (two) times daily.     glipiZIDE (GLUCOTROL) 10 MG tablet Take 10 mg by mouth daily before breakfast.     glucose blood test strip Use 1 strip 3 times a day with meter     HUMALOG 100 UNIT/ML injection SMARTSIG:120 Unit(s) SUB-Q Daily     hydrochlorothiazide (HYDRODIURIL) 12.5 MG tablet Take 12.5 mg by mouth daily.     hydrocortisone (ANUSOL-HC) 2.5 % rectal cream Place rectally.     hydrOXYzine (ATARAX/VISTARIL) 25 MG tablet Take 25 mg by mouth 4 (four) times daily as needed.     Insulin Syringe-Needle U-100 31G X 5/16" 1 ML MISC Use as directed with the Lantus Insulin nightly     ketoconazole (NIZORAL) 2 % cream Apply topically daily.     lidocaine (XYLOCAINE) 2 % solution Use as directed 15 mLs in the mouth or throat as needed for mouth pain. 100 mL 0   loperamide (IMODIUM) 2 MG capsule Take by mouth.     montelukast (SINGULAIR) 10 MG tablet Take 1 tablet by mouth daily.     ondansetron (ZOFRAN-ODT) 4 MG disintegrating tablet Take 4 mg by mouth every 8 (eight) hours as needed.     oxybutynin (DITROPAN) 5 MG tablet Take 5 mg by mouth 2 (two) times daily.     oxybutynin (DITROPAN) 5 MG tablet Take 1 tablet by mouth 2 (two) times daily.     pantoprazole (PROTONIX) 40 MG tablet Take 40 mg by mouth daily.     polyethylene glycol powder (GLYCOLAX/MIRALAX) powder take 17GM (DISSOLVED IN WATER) by mouth once daily  0   PROAIR HFA 108 (90 Base) MCG/ACT inhaler 1-2 puffs every 4 (four) hours as needed. Last used: [id number]  0   promethazine (PHENERGAN) 25 MG tablet Take 25-50 mg by mouth at bedtime as needed.     propranolol (INNOPRAN XL) 120 MG 24 hr capsule Take 120 mg by mouth at bedtime.     ramelteon (ROZEREM) 8 MG tablet Take 8 mg by mouth at bedtime.     ramelteon (ROZEREM) 8 MG tablet Take by mouth.     sodium fluoride (FLUORISHIELD) 1.1 % GEL dental gel      tadalafil (CIALIS) 5 MG tablet Take by mouth.     testosterone cypionate  (DEPOTESTOSTERONE CYPIONATE) 200 MG/ML injection Inject into the muscle.     topiramate (TOPAMAX) 50 MG tablet Take 50 mg by mouth 2 (two) times daily.     WELLBUTRIN XL 150 MG 24 hr tablet Take 1 tablet by mouth every morning.     famotidine (PEPCID) 20 MG tablet Take by mouth.     levothyroxine (SYNTHROID) 50 MCG tablet Take by mouth.     levothyroxine (SYNTHROID) 50 MCG tablet Take 1 tablet by mouth daily.     montelukast (SINGULAIR) 10 MG tablet  Take by mouth.      Allergies  Allergen Reactions   Erythromycin Nausea And Vomiting   Penicillins Swelling   Sulfa Antibiotics Nausea And Vomiting   Social History   Occupational History   Not on file  Tobacco Use   Smoking status: Never   Smokeless tobacco: Never  Vaping Use   Vaping status: Never Used  Substance and Sexual Activity   Alcohol use: No   Drug use: No   Sexual activity: Not on file   Family History  Problem Relation Age of Onset   Diabetes Mother    Depression Mother    Diabetes Sister    Diabetes Brother    Diabetes Maternal Uncle    Diabetes Maternal Grandmother    Heart disease Maternal Grandmother    Cancer Maternal Grandfather    COPD Paternal Grandmother    Cancer Paternal Grandfather    Varicose Veins Paternal Grandfather    Healthy Father    Immunization History  Administered Date(s) Administered   Hepatitis B, ADULT 03/05/2013, 04/05/2013, 09/03/2013   Influenza, Seasonal, Injecte, Preservative Fre 10/08/2011   Influenza,inj,Quad PF,6+ Mos 03/05/2013, 03/18/2014, 03/22/2015, 03/05/2016, 03/19/2017, 02/23/2018   Influenza-Unspecified 03/19/2017, 02/23/2018, 03/25/2019, 03/10/2020, 04/05/2021   PFIZER(Purple Top)SARS-COV-2 Vaccination 12/04/2020, 12/27/2020   PPD Test 10/28/2016   Pneumococcal Polysaccharide-23 12/30/2012   Td 04/29/2000   Tdap 07/18/2008, 07/16/2013     Review of Systems: Negative except as noted in the HPI.   Objective: There were no vitals filed for this  visit.  Jason Davidson is a pleasant 42 y.o. male in NAD. AAO X 3.  Title   Diabetic Foot Exam - detailed Date & Time: 04/03/2023  9:45 AM Diabetic Foot exam was performed with the following findings: Yes  Visual Foot Exam completed.: Yes  Is there a history of foot ulcer?: Yes Is there a foot ulcer now?: No Is there swelling?: Yes Is there elevated skin temperature?: No Is there abnormal foot shape?: No Is there a claw toe deformity?: No Are the toenails long?: Yes Are the toenails thick?: Yes Are the toenails ingrown?: Yes Is the skin thin, fragile, shiny and hairless?": No Normal Range of Motion?: Yes Is there foot or ankle muscle weakness?: No Do you have pain in calf while walking?: No Are the shoes appropriate in style and fit?: Yes Can the patient see the bottom of their feet?: No Pulse Foot Exam completed.: Yes   Right Posterior Tibialis: Present Left posterior Tibialis: Present   Right Dorsalis Pedis: Present Left Dorsalis Pedis: Present     Sensory Foot Exam Completed.: Yes Semmes-Weinstein Monofilament Test "+" means "has sensation" and "-" means "no sensation"  R Foot Test Control: Pos L Foot Test Control: Pos   R Site 1-Great Toe: Pos L Site 1-Great Toe: Pos   R Site 4: Pos L Site 4: Pos   R site 5: Pos L Site 5: Pos  R Site 6: Pos L Site 6: Pos     Image components are not supported.   Image components are not supported. Image components are not supported.  Tuning Fork Right vibratory: present Left vibratory: present  Comments Patient wearing compression hose on today's visit. Varicosities present b/l.     ADA Risk Categorization: High Risk  Patient has one or more of the following: Loss of protective sensation Absent pedal pulses Severe Foot deformity History of foot ulcer  Assessment: 1. Pain due to onychomycosis of toenail   2. Lymphedema   3. Diabetic  polyneuropathy associated with type 2 diabetes mellitus (HCC)   4. Encounter for  diabetic foot exam [facility])     Plan: -Patient was evaluated today. All questions/concerns addressed on today's visit. -Patient's family member present. All questions/concerns addressed on today's visit. -Continue compression hose for lymphedema management. -Diabetic foot examination performed today. -Continue diabetic foot care principles: inspect feet daily, monitor glucose as recommended by PCP and/or Endocrinologist, and follow prescribed diet per PCP, Endocrinologist and/or dietician. -Patient to continue soft, supportive shoe gear daily. -Toenails 1-5 b/l were debrided in length and girth with sterile nail nippers and dremel without iatrogenic bleeding.  -Patient/POA to call should there be question/concern in the interim. Return in about 3 months (around 07/05/2023).  Freddie Breech, DPM

## 2023-04-09 NOTE — Unmapped (Signed)
The patient is requesting for the Front Desk to contact them in regards to  Scheduling sooner appt with provider or any other for Very Bothersome  SX he is currently having, No appt within clinic or regional for pt, He do not want go Urgent care.     Please contact The patient by Cell Phone    Urgent callback turnaround time: within 24 business hours. (Caller Notified)Copied from CRM [id number]. Topic: Scheduling - New Appointment  >> Apr 09, 2023 10:51 AM Erma Pinto wrote:  Caller asked to schedule a new appointment

## 2023-04-09 NOTE — Unmapped (Signed)
[name] left a message, Byram needs [name]'s clinical notes for him to get his dexcom supplies.   Order in [name]'s folder>  Once signed will fax with 03/05/23 office note and A1C results.

## 2023-04-10 ENCOUNTER — Ambulatory Visit
Admission: RE | Admit: 2023-04-10 | Discharge: 2023-04-10 | Disposition: A | Discharge status: Home / Self Care | Payer: 59 | Source: Ambulatory Visit | Attending: Emergency Medicine | Admitting: Emergency Medicine

## 2023-04-10 ENCOUNTER — Ambulatory Visit: Payer: 59

## 2023-04-10 VITALS — BP 134/88 | HR 76 | Temp 98.4°F | Ht 71.0 in | Wt >= 6400 oz

## 2023-04-10 DIAGNOSIS — N481 Balanitis: Secondary | ICD-10-CM | POA: Insufficient documentation

## 2023-04-10 DIAGNOSIS — N342 Other urethritis: Secondary | ICD-10-CM | POA: Insufficient documentation

## 2023-04-10 DIAGNOSIS — R300 Dysuria: Secondary | ICD-10-CM | POA: Insufficient documentation

## 2023-04-10 LAB — URINALYSIS, W/ REFLEX TO CULTURE (INFECTION SUSPECTED)
Bilirubin Urine: NEGATIVE
Glucose, UA: NEGATIVE mg/dL
Ketones, ur: NEGATIVE mg/dL
Nitrite: NEGATIVE
Protein, ur: NEGATIVE mg/dL
Specific Gravity, Urine: 1.02 (ref 1.005–1.030)
Squamous Epithelial / HPF: NONE SEEN /[HPF] (ref 0–5)
pH: 6.5 (ref 5.0–8.0)

## 2023-04-10 MED ORDER — MICONAZOLE NITRATE 2 % EX CREA: 1.0000 | TOPICAL_CREAM | Freq: Two times a day (BID) | CUTANEOUS | 0 refills | Status: DC

## 2023-04-10 MED ORDER — DOXYCYCLINE HYCLATE 100 MG PO CAPS: 100.0000 mg | ORAL_CAPSULE | Freq: Two times a day (BID) | ORAL | 0 refills | Status: AC

## 2023-04-10 MED ORDER — CEFTRIAXONE SODIUM 1 G IJ SOLR
1.0000 g | Freq: Once | INTRAMUSCULAR | Status: AC
  Administered 2023-04-10: 1 g via INTRAMUSCULAR

## 2023-04-10 NOTE — ED Provider Notes (Signed)
HPI  SUBJECTIVE:  Jason Davidson is a 42 y.o. male who presents with dysuria, itching burning pain along his penis for the past week.  Reports urinary urgency, odorous urine, and white/yellow urethral discharge that he states has resolved.  He denies urinary frequency, cloudy urine, hematuria.  No nausea, vomiting, fevers, abdominal, back, pelvic pain.  No testicular pain or swelling.  He is in a monogamous relationship with his wife, who accompanies him today.  She states that she is asymptomatic.  He states that not had intercourse in a month.  No new lotions, soaps, detergents.  No antibiotics in the past month.  No antipyretic in the past 6 hours.  He tried Tylenol and ibuprofen without improvement in his symptoms.  No aggravating factors.  He has had symptoms like this before when he was found to have UTI.  Patient has a past medical history of diabetes, balanitis, trichomonas, GERD, gout, hypertension, nonalcoholic fatty liver disease, asthma, UTI, he had hidradenitis suppurativa, hypercholesterolemia, hypogonadism followed by urology.  States his glucose has been running within normal limits for him.  No history of pyelonephritis, nephrolithiasis, gonorrhea, chlamydia, HIV, HSV, syphilis, prostatitis, epididymitis.  PCP: [facility] [place].  Called his PCP with UTI symptoms on 10/23 and again yesterday, and was advised to come to the urgent care for evaluation.  Past Medical History:  Diagnosis Date   Asthma    Depressed    Diabetes mellitus without complication (HCC)    Gout    IBS (irritable bowel syndrome)    Morbid obesity (HCC)    Sleep apnea    Thyroid disease     Past Surgical History:  Procedure Laterality Date   COLONOSCOPY     ESOPHAGOGASTRODUODENOSCOPY ENDOSCOPY     EYE SURGERY     LASER ABLATION INCOMPETENT VEIN INI      Family History  Problem Relation Age of Onset   Diabetes Mother    Depression Mother    Diabetes Sister    Diabetes Brother     Diabetes Maternal Uncle    Diabetes Maternal Grandmother    Heart disease Maternal Grandmother    Cancer Maternal Grandfather    COPD Paternal Grandmother    Cancer Paternal Grandfather    Varicose Veins Paternal Grandfather    Healthy Father     Social History   Tobacco Use   Smoking status: Never   Smokeless tobacco: Never  Vaping Use   Vaping status: Never Used  Substance Use Topics   Alcohol use: No   Drug use: No    No current facility-administered medications for this encounter.  Current Outpatient Medications:    doxycycline (VIBRAMYCIN) 100 MG capsule, Take 1 capsule (100 mg total) by mouth 2 (two) times daily for 7 days., Disp: 14 capsule, Rfl: 0   miconazole (MICOTIN) 2 % cream, Apply 1 Application topically 2 (two) times daily., Disp: 28.35 g, Rfl: 0   Adalimumab 40 MG/0.4ML PNKT, Inject into the skin., Disp: , Rfl:    AIMOVIG 70 MG/ML SOAJ, , Disp: , Rfl: 0   albuterol (VENTOLIN HFA) 108 (90 Base) MCG/ACT inhaler, Inhale 1-2 puffs into the lungs every 6 (six) hours as needed for wheezing or shortness of breath., Disp: 18 g, Rfl: 0   ALPRAZolam (XANAX) 0.5 MG tablet, Take 0.5 mg by mouth at bedtime as needed for anxiety., Disp: , Rfl:    anastrozole (ARIMIDEX) 1 MG tablet, Take by mouth., Disp: , Rfl:    azelastine (ASTELIN) 0.1 %  nasal spray, Place into the nose., Disp: , Rfl:    clotrimazole-betamethasone (LOTRISONE) cream, Apply 1 Application topically daily., Disp: 30 g, Rfl: 2   Colchicine 0.6 MG CAPS, Take 1.2 mg on Day 1 of flare, followed by 0.6 mg one hour later. Day 2 take 0.6 mg BID until flare resolves., Disp: , Rfl:    cyclobenzaprine (FLEXERIL) 10 MG tablet, Take 1 tablet (10 mg total) by mouth 3 (three) times daily as needed., Disp: 15 tablet, Rfl: 0   dextroamphetamine (DEXTROSTAT) 10 MG tablet, Take 20 mg by mouth 2 (two) times daily., Disp: , Rfl:    dicyclomine (BENTYL) 10 MG capsule, Take 10 mg by mouth 3 (three) times daily before meals., Disp: ,  Rfl:    diphenoxylate-atropine (LOMOTIL) 2.5-0.025 MG tablet, Take by mouth., Disp: , Rfl:    DULoxetine (CYMBALTA) 60 MG capsule, Take 120 mg by mouth daily. , Disp: , Rfl:    famotidine (PEPCID) 20 MG tablet, Take by mouth., Disp: , Rfl:    fluticasone (FLONASE) 50 MCG/ACT nasal spray, Place 1 spray into both nostrils 2 (two) times daily., Disp: , Rfl:    glipiZIDE (GLUCOTROL) 10 MG tablet, Take 10 mg by mouth daily before breakfast., Disp: , Rfl:    glucose blood test strip, Use 1 strip 3 times a day with meter, Disp: , Rfl:    HUMALOG 100 UNIT/ML injection, SMARTSIG:120 Unit(s) SUB-Q Daily, Disp: , Rfl:    hydrochlorothiazide (HYDRODIURIL) 12.5 MG tablet, Take 12.5 mg by mouth daily., Disp: , Rfl:    hydrocortisone (ANUSOL-HC) 2.5 % rectal cream, Place rectally., Disp: , Rfl:    hydrOXYzine (ATARAX/VISTARIL) 25 MG tablet, Take 25 mg by mouth 4 (four) times daily as needed., Disp: , Rfl:    Insulin Syringe-Needle U-100 31G X 5/16" 1 ML MISC, Use as directed with the Lantus Insulin nightly, Disp: , Rfl:    ketoconazole (NIZORAL) 2 % cream, Apply topically daily., Disp: , Rfl:    levothyroxine (SYNTHROID) 50 MCG tablet, Take by mouth., Disp: , Rfl:    levothyroxine (SYNTHROID) 50 MCG tablet, Take 1 tablet by mouth daily., Disp: , Rfl:    lidocaine (XYLOCAINE) 2 % solution, Use as directed 15 mLs in the mouth or throat as needed for mouth pain., Disp: 100 mL, Rfl: 0   loperamide (IMODIUM) 2 MG capsule, Take by mouth., Disp: , Rfl:    montelukast (SINGULAIR) 10 MG tablet, Take by mouth., Disp: , Rfl:    montelukast (SINGULAIR) 10 MG tablet, Take 1 tablet by mouth daily., Disp: , Rfl:    ondansetron (ZOFRAN-ODT) 4 MG disintegrating tablet, Take 4 mg by mouth every 8 (eight) hours as needed., Disp: , Rfl:    oxybutynin (DITROPAN) 5 MG tablet, Take 5 mg by mouth 2 (two) times daily., Disp: , Rfl:    oxybutynin (DITROPAN) 5 MG tablet, Take 1 tablet by mouth 2 (two) times daily., Disp: , Rfl:     pantoprazole (PROTONIX) 40 MG tablet, Take 40 mg by mouth daily., Disp: , Rfl:    polyethylene glycol powder (GLYCOLAX/MIRALAX) powder, take 17GM (DISSOLVED IN WATER) by mouth once daily, Disp: , Rfl: 0   PROAIR HFA 108 (90 Base) MCG/ACT inhaler, 1-2 puffs every 4 (four) hours as needed. Last used: [id number], Disp: , Rfl: 0   promethazine (PHENERGAN) 25 MG tablet, Take 25-50 mg by mouth at bedtime as needed., Disp: , Rfl:    propranolol (INNOPRAN XL) 120 MG 24 hr capsule, Take 120 mg by  mouth at bedtime., Disp: , Rfl:    ramelteon (ROZEREM) 8 MG tablet, Take 8 mg by mouth at bedtime., Disp: , Rfl:    ramelteon (ROZEREM) 8 MG tablet, Take by mouth., Disp: , Rfl:    sodium fluoride (FLUORISHIELD) 1.1 % GEL dental gel, , Disp: , Rfl:    tadalafil (CIALIS) 5 MG tablet, Take by mouth., Disp: , Rfl:    testosterone cypionate (DEPOTESTOSTERONE CYPIONATE) 200 MG/ML injection, Inject into the muscle., Disp: , Rfl:    topiramate (TOPAMAX) 50 MG tablet, Take 50 mg by mouth 2 (two) times daily., Disp: , Rfl:    WELLBUTRIN XL 150 MG 24 hr tablet, Take 1 tablet by mouth every morning., Disp: , Rfl:   Allergies  Allergen Reactions   Erythromycin Nausea And Vomiting   Penicillins Swelling   Sulfa Antibiotics Nausea And Vomiting     ROS  As noted in HPI.   Physical Exam  BP 134/88 (BP Location: Right Wrist)   Pulse 76   Temp 98.4 F (36.9 C) (Oral)   Ht 5\' 11"  (1.803 m)   Wt (!) 217.3 kg   SpO2 96%   BMI 66.81 kg/m   Constitutional: Well developed, well nourished, no acute distress Eyes:  EOMI, conjunctiva normal bilaterally HENT: Normocephalic, atraumatic,mucus membranes moist Respiratory: Normal inspiratory effort Cardiovascular: Normal rate GI: Obese.  Soft, nontender.  No suprapubic, flank tenderness GU: Buried penis.  Was able to adequately visualize head of the penis with his wife's assistance.  Has some thick, white, nonodorous material around the glans.  The glans appears normal.  No  erythema, tenderness.  Thin yellowish urethral discharge.  Testes descended bilaterally, no epididymal, testicular, scrotal tenderness or swelling.  Wife present during exam. skin: No rash, skin intact Musculoskeletal: no deformities Neurologic: Alert & oriented x 3, no focal neuro deficits Psychiatric: Speech and behavior appropriate   ED Course   Medications  cefTRIAXone (ROCEPHIN) injection 1 g (1 g Intramuscular Given 04/10/23 1404)    Orders Placed This Encounter  Procedures   Urine Culture    Standing Status:   Standing    Number of Occurrences:   1   Urinalysis, w/ Reflex to Culture (Infection Suspected) -Urine, Clean Catch    Have patient swab self for GC/chlamydia/trichomonas prior to giving urine sample    Standing Status:   Standing    Number of Occurrences:   1    Order Specific Question:   Specimen Source    Answer:   Urine, Clean Catch [76]    Results for orders placed or performed during the hospital encounter of 04/10/23 (from the past 24 hour(s))  Urinalysis, w/ Reflex to Culture (Infection Suspected) -Urine, Clean Catch     Status: Abnormal   Collection Time: 04/10/23  1:02 PM  Result Value Ref Range   Specimen Source URINE, CLEAN CATCH    Color, Urine YELLOW YELLOW   APPearance CLEAR CLEAR   Specific Gravity, Urine 1.020 1.005 - 1.030   pH 6.5 5.0 - 8.0   Glucose, UA NEGATIVE NEGATIVE mg/dL   Hgb urine dipstick TRACE (A) NEGATIVE   Bilirubin Urine NEGATIVE NEGATIVE   Ketones, ur NEGATIVE NEGATIVE mg/dL   Protein, ur NEGATIVE NEGATIVE mg/dL   Nitrite NEGATIVE NEGATIVE   Leukocytes,Ua MODERATE (A) NEGATIVE   Squamous Epithelial / HPF NONE SEEN 0 - 5 /HPF   WBC, UA 21-50 0 - 5 WBC/hpf   RBC / HPF 0-5 0 - 5 RBC/hpf   Bacteria, UA MANY (  A) NONE SEEN   WBC Clumps PRESENT    Budding Yeast PRESENT    No results found.  ED Clinical Impression  1. Urethritis   2. Balanitis   3. Dysuria      ED Assessment/Plan     Sending off swab for gonorrhea,  chlamydia, trichomonas. UA.  Patient presents with dysuria, penile discharge.  He also has some thick cottage cheeselike material along his glans that is suggestive of a balanitis/penile yeast infection.  Will send home with miconazole 2% apply twice daily for 10 days.  UA is a clean catch with moderate leuks, many bacteria and trace hematuria suggestive of a UTI.  Sending this off for culture.  However, this would not cause penile discharge.  I suspect more of a urethritis with mucopurulent discharge on exam, leukocyte esterase and more than 10 WBCs on microscopy.  Giving patient 1 g of Rocephin as he is above 150 kg and send home with 7 days of doxycycline.  Will treat any possible UTI based on urine culture results.  Per chart review, patient has had Keflex in 2016 and 2020 cefdinir in 2018 .  Discussed labs, MDM, treatment plan, and plan for follow-up with patient. . patient agrees with plan.   Meds ordered this encounter  Medications   cefTRIAXone (ROCEPHIN) injection 1 g   doxycycline (VIBRAMYCIN) 100 MG capsule    Sig: Take 1 capsule (100 mg total) by mouth 2 (two) times daily for 7 days.    Dispense:  14 capsule    Refill:  0   miconazole (MICOTIN) 2 % cream    Sig: Apply 1 Application topically 2 (two) times daily.    Dispense:  28.35 g    Refill:  0      *This clinic note was created using Scientist, clinical (histocompatibility and immunogenetics). Therefore, there may be occasional mistakes despite careful proofreading.  ?    Domenick Gong, MD 04/10/23 2000

## 2023-04-10 NOTE — ED Triage Notes (Signed)
Patient reports itching and burning when urinating and states now it is starting to hurt and he had discharged that have stopped. No treatment used.

## 2023-04-10 NOTE — Discharge Instructions (Signed)
I have treated you for urethritis with 1 g of Rocephin here today.  Finish the doxycycline, even if you feel better.  We will contact you with any abnormal lab results.  We will treat you for UTI if your urine culture comes back positive for a UTI.  Keep your penile area as clean and dry as possible.  The miconazole will help with the yeast infection that you have.  Apply this twice a day for 10-14 days.  No intercourse until all of your labs have been resulted.

## 2023-04-11 LAB — CYTOLOGY, (ORAL, ANAL, URETHRAL) ANCILLARY ONLY
Chlamydia: NEGATIVE
Comment: NEGATIVE
Comment: NEGATIVE
Comment: NORMAL
Neisseria Gonorrhea: NEGATIVE
Trichomonas: POSITIVE — AB

## 2023-04-11 LAB — URINE CULTURE: Culture: <10000 — AB

## 2023-04-14 ENCOUNTER — Encounter: Payer: Self-pay | Admitting: Emergency Medicine

## 2023-04-15 ENCOUNTER — Telehealth (HOSPITAL_COMMUNITY): Payer: Self-pay | Admitting: Emergency Medicine

## 2023-04-15 MED ORDER — TINIDAZOLE 500 MG PO TABS: 2.0000 g | ORAL_TABLET | Freq: Once | ORAL | 0 refills | Status: AC

## 2023-04-15 NOTE — Telephone Encounter (Signed)
Tinidazole for positive Trichomonas, per Dr. Chaney Malling

## 2023-04-15 NOTE — Unmapped (Signed)
[facility] Refill Coordination Note    Specialty Medication(s) to be Shipped:   Inflammatory Disorders: Humira    Other medication(s) to be shipped: No additional medications requested for fill at this time     Logan Quinn., DOB: [birth year]  Phone: There are no phone numbers on file.      All above HIPAA information was verified with patient.     Was a Nurse, learning disability used for this call? No    Completed refill call assessment today to schedule patient's medication shipment from the [facility]  [phone]).  All relevant notes have been reviewed.     Specialty medication(s) and dose(s) confirmed: Regimen is correct and unchanged.   Changes to medications: Logan Quinn reports no changes at this time.  Changes to insurance: No  New side effects reported not previously addressed with a pharmacist or physician: None reported  Questions for the pharmacist: No    Confirmed patient received a Conservation officer, historic buildings and a Surveyor, mining with first shipment. The patient will receive a drug information handout for each medication shipped and additional FDA Medication Guides as required.       DISEASE/MEDICATION-SPECIFIC INFORMATION        For patients on injectable medications: Patient currently has 1 doses left.  Next injection is scheduled for 11/7.    SPECIALTY MEDICATION ADHERENCE     Medication Adherence    Patient reported X missed doses in the last month: 0  Specialty Medication: Humira              Were doses missed due to medication being on hold? No    Humira 80/0.8 mg/ml: 1 doses of medicine on hand     REFERRAL TO PHARMACIST     Referral to the pharmacist: Not needed      [facility]     Shipping address confirmed in Epic.       Delivery Scheduled: Yes, Expected medication delivery date: 11/14.     Medication will be delivered via Same Day Courier to the prescription address in Epic WAM.    Ruthine Dose, PharmD   [facility]  Specialty Pharmacist

## 2023-04-22 NOTE — Unmapped (Signed)
Copied from CRM [id number]. Topic: Scheduling - New Appointment  >> Apr 22, 2023 11:32 AM Doreatha Lew wrote:  Caller asked to schedule a new appointment    Pt called to sched an appt for painful urination and discharge. Pt states he has been to UC and given medication but it has not helped. No available appt at this time. Please contact pt and advise.

## 2023-04-23 ENCOUNTER — Ambulatory Visit: Admit: 2023-04-23 | Discharge: 2023-04-24 | Payer: MEDICARE | Attending: Family | Primary: Family

## 2023-04-23 DIAGNOSIS — A599 Trichomoniasis, unspecified: Principal | ICD-10-CM

## 2023-04-23 DIAGNOSIS — R309 Painful micturition, unspecified: Principal | ICD-10-CM

## 2023-04-23 DIAGNOSIS — R369 Urethral discharge, unspecified: Principal | ICD-10-CM

## 2023-04-23 MED ORDER — FLUCONAZOLE 150 MG TABLET
ORAL_TABLET | Freq: Every day | ORAL | 0 refills | 2 days | Status: CP
Start: 2023-04-23 — End: ?

## 2023-04-23 MED ORDER — TINIDAZOLE 500 MG TABLET
ORAL_TABLET | Freq: Two times a day (BID) | ORAL | 0 refills | 5 days | Status: CP
Start: 2023-04-23 — End: 2023-04-28

## 2023-04-23 NOTE — Unmapped (Signed)
 Assessment and Plan:     Penile discharge  Patient presents today for a trichomonas infection. Discussed that the topical miconazole may have not been strong enough to eliminate the yeast. Recommended an oral antifungal medication. Recommended a longer course of tinidazole. Trial tinidazole 500 mg BID x 7 days and Fluconazole 150 mg tablet take 1 tablet then repeat 1 tablet in 5 days. If symptoms worsen or persist contact this provider.  - Chlamydia/Gonorrhoeae NAA; Future  - POCT urinalysis dipstick  - Chlamydia/Gonorrhoeae NAA  - fluconazole (DIFLUCAN) 150 MG tablet; Take 1 tablet (150 mg total) by mouth daily. Take 1 tablet then repeat 1 tablet in 5 days.    Painful urination  - Chlamydia/Gonorrhoeae NAA; Future  - POCT urinalysis dipstick  - Chlamydia/Gonorrhoeae NAA  - fluconazole (DIFLUCAN) 150 MG tablet; Take 1 tablet (150 mg total) by mouth daily. Take 1 tablet then repeat 1 tablet in 5 days.    Trichomonas infection  - TRICHOMONAS NAA  - tinidazole (TINDAMAX) 500 MG tablet; Take 1 tablet (500 mg total) by mouth two (2) times a day for 5 days.       I personally spent 30 minutes face-to-face and non-face-to-face in the care of this patient, which includes all pre, intra, and post visit time on the date of service.    Return if symptoms worsen or fail to improve.    HPI:      Logan Quinn. is here for   Chief Complaint   Patient presents with    Painful urination and discharge     Painful urination and discharge.  Aching, burning and stabbing for over a month.    Hematuria    Urinary Frequency     Patient presents today after being seen at urgent care on 10/31 in which he tested positive for trichomonas. He was treated with Rocephin, doxycycline, and miconazole. Once the culture returned positive they switched him to tinidazole. He notes that it has not improved. His current symptoms include dysuria, penile discharge, hematuria, and urinary frequency.     Answers submitted by the patient for this visit:  Painful Urination Questionnaire (Submitted on 04/22/2023)  Chief Complaint: Dysuria  Chronicity: recurrent  Onset: more than 1 month ago  Frequency: every urination  Progression since onset: waxing and waning  Pain quality: aching, burning, stabbing  Pain severity: moderate  Pain - numeric: 8/10  Fever: no fever  Sexually active?: Yes  History of pyelonephritis?: No  hematuria: Yes  chills: No  hesitancy: No  discharge: Yes  frequency: Yes  nausea: No  flank pain: No  possible pregnancy: No  urgency: Yes  sweats: No  vomiting: No       ROS:      Comprehensive 10 point ROS negative unless otherwise stated in the HPI.      PCMH Components:     Medication adherence and barriers to the treatment plan have been addressed. Opportunities to optimize healthy behaviors have been discussed. Patient / caregiver voiced understanding.    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)     Obstructive sleep apnea     OSA on CPAP     Retinal detachment 04/2014    Severe obstructive sleep apnea     Trapezius muscle strain 12/07/2013    Urinary incontinence, nocturnal enuresis     Venous insufficiency      Past Surgical History:   Procedure Laterality Date    EYE SURGERY  04/2014    LASER ABLATION INCOMPETENT VEIN INI [facility] HISTORICAL RESULT) Right     PR COLONOSCOPY FLX DX W/COLLJ SPEC WHEN PFRMD N/A 03/24/2013    Procedure: COLONOSCOPY, FLEXIBLE, PROXIMAL TO SPLENIC FLEXURE; DIAGNOSTIC, W/WO COLLECTION SPECIMEN BY BRUSH OR WASH; Surgeon: Clint Bolder, MD;  Location: [facility];  Service: Gastroenterology    PR EYE SURG POST SGMT PROC UNLISTED Left     pneumatic retinopexy OS    PR UPPER GI ENDOSCOPY,DIAGNOSIS N/A 02/02/2013    Procedure: UGI ENDO, INCLUDE ESOPHAGUS, STOMACH, & DUODENUM &/OR JEJUNUM; DX W/WO COLLECTION SPECIMN, BY BRUSH OR WASH;  Surgeon: Malcolm Metro, MD;  Location: [facility];  Service: Gastroenterology    SKIN BIOPSY      [place] PYLORIC STENOSIS ([place] HISTORICAL RESULT)         Family History:     Family History   Problem Relation Age of Onset    Cancer Maternal Grandfather         Stomach Cancer    Hearing loss Maternal Grandfather     Cancer Paternal Grandfather         Bone Cancer, Lung Cancer    COPD Paternal Grandmother     Arthritis Paternal Grandmother     Depression Paternal Grandmother     Diabetes Mother     Heart disease Mother     Migraines Mother     Arthritis Mother     Depression Mother     GER disease Mother     Hypertension Mother     Angina Mother     COPD Mother     Glaucoma Mother     Hearing loss Mother     Cataracts Mother     Diabetes Sister     Migraines Sister     Asthma Sister     Depression Sister     Hearing loss Sister     Diabetes Brother     Asthma Brother     Diabetes Maternal Grandmother     Heart disease Maternal Grandmother     Migraines Maternal Grandmother     Depression Maternal Grandmother     Angina Maternal Grandmother     Hypertension Maternal Grandmother     Stroke Maternal Grandmother     Diabetes Maternal Uncle     Hearing loss Maternal Uncle     Diabetes Maternal Uncle         resulted in need for kidney transplant    Liver disease Maternal Uncle     Kidney disease Maternal Uncle         needed kidney transplant    Asthma Brother     No Known Problems Father     No Known Problems Paternal Aunt     No Known  Problems Paternal Uncle     No Known Problems Other     Colorectal Cancer Neg Hx     Esophageal cancer Neg Hx     Liver cancer Neg Hx Pancreatic cancer Neg Hx     Stomach cancer Neg Hx     Amblyopia Neg Hx     Blindness Neg Hx     Retinal detachment Neg Hx     Strabismus Neg Hx     Macular degeneration Neg Hx     Anesthesia problems Neg Hx     Broken bones Neg Hx     Clotting disorder Neg Hx     Collagen disease Neg Hx     Dislocations Neg Hx     Fibromyalgia Neg Hx     Gout Neg Hx     Hemophilia Neg Hx     Osteoporosis Neg Hx     Rheumatologic disease Neg Hx     Scoliosis Neg Hx     Severe sprains Neg Hx     Sickle cell anemia Neg Hx     Spinal Compression Fracture Neg Hx     Melanoma Neg Hx     Basal cell carcinoma Neg Hx     Squamous cell carcinoma Neg Hx     Deep vein thrombosis Neg Hx     Thyroid disease Neg Hx        Social History:     Social History     Tobacco Use    Smoking status: Never     Passive exposure: Never    Smokeless tobacco: Never   Vaping Use    Vaping status: Never Used   Substance Use Topics    Alcohol use: Never     Comment: rare social    Drug use: Never       Allergies:     Erythromycin, Penicillins, Sulfa (sulfonamide antibiotics), Other, and Metronidazole    Current Medications:     Current Outpatient Medications   Medication Sig Dispense Refill    adalimumab (HUMIRA,CF, PEN) 80 mg/0.8 mL PnKt Inject 80 mg subcutaneously once every week 4 each 11    allopurinol (ZYLOPRIM) 100 MG tablet Take 2 tablets (200 mg total) by mouth daily. 180 tablet 1    ALPRAZolam (XANAX) 0.5 MG tablet Takes 1 tablet nightly and 1 tablet a day when needed      anastrozole (ARIMIDEX) 1 mg tablet Take 1 tablet (1 mg total) by mouth daily. 30 tablet 1    ascorbic acid (VITAMIN C ORAL) Take 1 capsule by mouth nightly.      atorvastatin (LIPITOR) 20 MG tablet Take 1 tablet (20 mg total) by mouth daily. 90 tablet 3    blood sugar diagnostic (ACCU-CHEK GUIDE TEST STRIPS) Strp USE TO CHECK BLOOD SUGAR 3 TIMES DAILY BEFORE MEALS      blood-glucose meter kit Use as directed 1 each 0    clindamycin (CLEOCIN T) 1 % lotion Apply topically two (2) times a day. To affected areas on body as needed for flares 60 mL 3    clobetasoL (TEMOVATE) 0.05 % ointment Apply daily to painful affected areas if needed for flares, then stop 15 g 1    cyclobenzaprine (FLEXERIL) 10 MG tablet Take 1 tablet (10 mg total) by mouth Three (3) times a day as needed for muscle spasms. 60 tablet 1    dextroamphetamine sulfate (DEXTROSTAT) 10 MG tablet Take 2 tablets (20 mg total) by mouth two (2) times a day.  dicyclomine (BENTYL) 10 mg capsule Take 1 capsule (10 mg total) by mouth Four (4) times a day (before meals and nightly). 120 capsule 1    diphenoxylate-atropine (LOMOTIL) 2.5-0.025 mg per tablet Take 1 tablet by mouth four (4) times a day as needed for diarrhea. 30 tablet 1    DULoxetine (CYMBALTA) 60 MG capsule Take 1 capsule (60 mg total) by mouth Two (2) times a day. 180 capsule 0    empty container Misc Use as directed to dispose of needles. When full, make sure lid is closed tightly then dispose of container in trash. 1 each 2    erenumab-aooe (AIMOVIG AUTOINJECTOR) auto-injector Inject 1 Pen under the skin every thirty (30) days. 3 mL 1    famotidine (PEPCID) 20 MG tablet Take 1 tablet (20 mg total) by mouth two (2) times a day. 180 tablet 3    fluoride, sodium, 1.1 % Pste       fluticasone propionate (FLONASE) 50 mcg/actuation nasal spray 1 spray into each nostril two (2) times a day. 16 g 5    glipiZIDE (GLUCOTROL) 10 MG tablet Take 2 tablets (20 mg total) by mouth Two (2) times a day (30 minutes before a meal). 360 tablet 3    HUMALOG U-100 INSULIN 100 unit/mL injection INJECT 120 UNITS UNDER THE SKIN DAILY VIA OMNIPOD 40 mL 0    hydrocortisone (PROCTOSOL HC) 2.5 % rectal cream Insert 1 Application into the rectum two (2) times a day as needed. 28.35 g 2    hydrOXYzine (ATARAX) 25 MG tablet Take 1 tablet (25 mg total) by mouth Three (3) times a day as needed. May take two before bed for sleep. 120 tablet 1    ibuprofen (ADVIL,MOTRIN) 800 MG tablet Take 1 tablet (800 mg total) by mouth every eight (8) hours as needed. 90 tablet 1    insulin glargine (LANTUS U-100 INSULIN) 100 unit/mL injection Inject 0.05 mL (5 Units total) under the skin nightly. 10 mL 0    insulin pump cart,automated,BT (OMNIPOD 5 G6 PODS, GEN 5,) Crtg Change POD every 48 hours. 3 each 11    insulin syringe-needle U-100 (BD INSULIN SYRINGE ULTRA-FINE) 1 mL 31 gauge x 5/16 (8 mm) Syrg Once daily for insulin dosing. 100 each 1    insulin syringe-needle U-100 1 mL 31 gauge x 5/16 (8 mm) Syrg USE AS DIRECTED ONE TIME DAILY FOR INSULIN DOSING      ketoconazole (NIZORAL) 2 % cream Apply 1 Application topically daily. To affected area on body until clear 60 g 3    lancets (ACCU-CHEK FASTCLIX LANCET DRUM) Misc Use to check blood sugars 3 times a day before meals or as directed 200 each 5    lancets (ACCU-CHEK FASTCLIX LANCET DRUM) Misc USE TO CHECK BLOOD SUGAR THREE TIMES DAILY BEFORE MEALS OR AS DIRECTED      levothyroxine (SYNTHROID) 50 MCG tablet Take 1 tablet (50 mcg total) by mouth daily. 90 tablet 1    lidocaine (XYLOCAINE) 5 % ointment Apply topically Three (3) times a day as needed. 30 g 1    losartan (COZAAR) 25 MG tablet Take 1 tablet (25 mg total) by mouth daily. 90 tablet 3    montelukast (SINGULAIR) 10 mg tablet Take 1 tablet (10 mg total) by mouth daily. 90 tablet 3    multivitamin (MULTIPLE VITAMIN ORAL) Take 1 tablet by mouth daily.      ondansetron (ZOFRAN) 4 MG tablet Take 1 tablet (4 mg total) by mouth every  eight (8) hours as needed for nausea. 30 tablet 1    oxybutynin (DITROPAN) 5 MG tablet Take 1 tablet (5 mg total) by mouth two (2) times a day. 180 tablet 1    OZEMPIC 1 mg/dose (4 mg/3 mL) PnIj injection INJECT 1 MG SUBCUTANEOUSLY EVERY 7 DAYS      pantoprazole (PROTONIX) 40 MG tablet Take 1 tablet (40 mg total) by mouth two (2) times a day. 180 tablet 1    polyethylene glycol (GLYCOLAX) 17 gram/dose powder       propranoloL (INDERAL LA) 120 mg 24 hr capsule Take 1 capsule (120 mg total) by mouth daily. 90 capsule 1    ramelteon (ROZEREM) 8 mg tablet Take 1 tablet (8 mg total) by mouth nightly.      safety needles (BD SAFETYGLIDE NEEDLE) 18 gauge x 1 1/2 Ndle USE ONE NEEDLE ONCE WEEKLY      safety needles (BD SAFETYGLIDE NEEDLE) 23 gauge x 1 Ndle 1 Units by Miscellaneous route once a week. 12 each 11    safety needles 18 gauge x 1 1/2 Ndle 1 Units by Miscellaneous route once a week. 12 each 11    sour cherry extract (TART CHERRY EXTRACT) 1,000 mg cap       syringe with needle (BD LUER-LOK SYRINGE) 3 mL 21 gauge x 1 1/2 Syrg To draw up testosterone once weekly 100 each 0    syringe with needle (BD LUER-LOK SYRINGE) 3 mL 23 gauge x 1 1/2 Syrg 1 each by Miscellaneous route once a week. To administer testosterone 50 each 0    tadalafil (CIALIS) 5 MG tablet Take 1 tablet (5 mg total) by mouth in the morning. 90 tablet 3    testosterone cypionate (DEPOTESTOTERONE CYPIONATE) 200 mg/mL injection Inject 0.5 mL (100 mg total) into the muscle once a week. Inject 0.5cc (100mg ) weekly 2 mL 5    topiramate (TOPAMAX) 50 MG tablet Take 1.5 tablets (75 mg total) by mouth two (2) times a day. 270 tablet 3    WELLBUTRIN XL 150 mg 24 hr tablet Take 1 tablet (150 mg total) by mouth every morning.      albuterol 2.5 mg /3 mL (0.083 %) nebulizer solution Inhale 3 mL (2.5 mg total) by nebulization every four (4) hours as needed for wheezing. (Patient not taking: Reported on 08/21/2022) 180 mL 2    albuterol HFA 90 mcg/actuation inhaler Inhale 2 puffs every six (6) hours as needed for wheezing. (Patient not taking: Reported on 04/23/2023) 8.5 g 5    budesonide-formoterol (SYMBICORT) 80-4.5 mcg/actuation inhaler Inhale 2 puffs two (2) times a day. (Patient not taking: Reported on 04/23/2023) 10.2 g 11    chlorhexidine (PERIDEX) 0.12 % solution 15 mL by Oromucosal route two (2) times a day. (Patient not taking: Reported on 04/23/2023) 900 mL 3    colchicine (MITIGARE) 0.6 mg cap capsule Take 1 capsule (0.6 mg total) by mouth daily. (Patient not taking: Reported on 04/23/2023) 90 capsule 0    fluconazole (DIFLUCAN) 150 MG tablet Take 1 tablet (150 mg total) by mouth daily. Take 1 tablet then repeat 1 tablet in 5 days. 2 tablet 0    fluocinonide (LIDEX) 0.05 % gel Apply 1 Application topically Three (3) times a day. (Patient not taking: Reported on 04/23/2023)      insulin syringe-needle U-100 0.3 mL 31 gauge x 5/16 (8 mm) Syrg USE DAILY WITH LANTUS INJECTIONS      LIDOCAINE 2 % solution Swish and spit three  times a day as needed (Patient not taking: Reported on 04/23/2023)      tinidazole (TINDAMAX) 500 MG tablet Take 1 tablet (500 mg total) by mouth two (2) times a day for 5 days. 10 tablet 0     No current facility-administered medications for this visit.       Health Maintenance:     Health Maintenance   Topic Date Due    COVID-19 Vaccine (3 - Pfizer risk series) 01/24/2021    Retinal Eye Exam  10/31/2022    Hemoglobin A1c  06/04/2023    DTaP/Tdap/Td Vaccines (4 - Td or Tdap) 07/17/2023    Foot Exam  11/25/2023    Urine Albumin/Creatinine Ratio  11/26/2023    Serum Creatinine Monitoring  03/04/2024    Potassium Monitoring  03/04/2024    Pneumococcal Vaccine 0-64  Completed    Hepatitis C Screen  Completed    Influenza Vaccine  Completed       Immunizations:     Immunization History   Administered Date(s) Administered    COVID-19 VACC,MRNA,(PFIZER)(PF) 12/04/2020, 12/27/2020    HEPATITIS B VACCINE ADULT,IM(ENERGIX B, RECOMBIVAX) 03/05/2013, 04/05/2013, 09/03/2013    Hepatitis B Vaccine, Unspecified Formulation 12/27/1999, 04/29/2000    INFLUENZA INJ MDCK PF, QUAD,(FLUCELVAX)(30MO AND UP EGG FREE) 03/31/2019    INFLUENZA TIV (TRI) PF (IM)(HISTORICAL) 10/08/2011    INFLUENZA VACCINE IIV3(IM)(PF)6 MOS UP 03/05/2023    Influenza Vaccine Quad(IM)6 MO-Adult(PF) 03/05/2013, 03/18/2014, 03/22/2015, 03/05/2016, 03/19/2017, 02/23/2018, 02/19/2022    Influenza Virus Vaccine, unspecified formulation 03/19/2017, 02/23/2018, 03/25/2019, 03/10/2020, 04/05/2021    PNEUMOCOCCAL POLYSACCHARIDE 23-VALENT 12/30/2012    PPD Test 10/28/2016    Pneumococcal Conjugate 20-valent 01/02/2022    TD(TDVAX),ADSORBED,2LF(IM)(PF) 04/29/2000    TdaP 07/18/2008, 07/16/2013     I have reviewed and (if needed) updated the patient's problem list, medications, allergies, past medical and surgical history, social and family history.     Vital Signs:     Wt Readings from Last 3 Encounters:   04/23/23 (!) 206.8 kg (456 lb)   03/18/23 (!) 206.9 kg (456 lb 3.2 oz)   03/05/23 (!) 205.5 kg (453 lb)     Temp Readings from Last 3 Encounters:   04/23/23 37 ??C (98.6 ??F) (Oral)   03/05/23 36.3 ??C (97.3 ??F)   12/31/22 36.9 ??C (98.5 ??F) (Temporal)     BP Readings from Last 3 Encounters:   04/23/23 126/84   03/18/23 144/84   03/05/23 118/76     Pulse Readings from Last 3 Encounters:   04/23/23 98   03/18/23 69   03/05/23 62     Estimated body mass index is 63.6 kg/m?? as calculated from the following:    Height as of this encounter: 180.3 cm (5' 11).    Weight as of this encounter: 206.8 kg (456 lb).  Facility age limit for growth %iles is 20 years.      Objective:      General: Alert and oriented x3. Well-appearing. No acute distress. Morbid obesity BMI > 63   HEENT:  Normocephalic.  Atraumatic. Conjunctiva and sclera normal.   Neck:  Supple.   Skin:  Warm, dry. No rash or lesions present on visible skin.  Neuro:  Non-focal. No obvious weakness.   Psych:  Affect normal, eye contact good, speech clear and coherent.   Patient declines genital exam.        I attest that I, Arta Bruce, personally documented this note while acting as scribe for [name], FNP.  Arta Bruce, Scribe.  04/23/2023     The documentation recorded by the scribe accurately reflects the service I personally performed and the decisions made by me.     [name], FNP

## 2023-04-24 MED FILL — HUMIRA(CF) PEN 80 MG/0.8 ML SUBCUTANEOUS KIT: 28 days supply | Qty: 4 | Fill #9

## 2023-05-19 NOTE — Unmapped (Signed)
Is he still on Ozempic. I have a Pa rewuest for 0.25 but it looks like he was on higher dose but not on med list now

## 2023-05-22 MED ORDER — OZEMPIC 1 MG/DOSE (4 MG/3 ML) SUBCUTANEOUS PEN INJECTOR
SUBCUTANEOUS | 2 refills | 28.00 days | Status: CP
Start: 2023-05-22 — End: 2024-05-21

## 2023-05-22 MED FILL — HUMIRA(CF) PEN 80 MG/0.8 ML SUBCUTANEOUS KIT: 28 days supply | Qty: 4 | Fill #10

## 2023-05-22 NOTE — Unmapped (Signed)
Patient is requesting the following refill  Requested Prescriptions     Pending Prescriptions Disp Refills    semaglutide (OZEMPIC) 1 mg/dose (4 mg/3 mL) PnIj injection [Pharmacy Med Name: OZEMPIC 4 MG/3 ML (1 MG/DOSE)] 3 mL 11     Sig: Inject 1 mg under the skin every seven (7) days.       Recent Visits  Date Type Provider Dept   04/23/23 Office Visit Loran Senters, FNP [facility] [address] [facility]   03/05/23 Office Visit Loran Senters, FNP [place] [facility] [address] [facility]   11/26/22 Office Visit Loran Senters, FNP [place] [facility] [address] [facility]   08/21/22 Office Visit Loran Senters, FNP [facility] [address] [facility]   07/31/22 Office Visit Loran Senters, FNP [place] [facility] [address] [facility]   07/02/22 Office Visit Loran Senters, FNP [place] [facility] [address] [facility]   Showing recent visits within past 365 days and meeting all other requirements  Future Appointments  Date Type Provider Dept   06/13/23 Appointment Loran Senters, FNP  [facility] [address] [facility]   Showing future appointments within next 365 days and meeting all other requirements       Labs: A1c:   Hemoglobin A1C (%)   Date Value   03/05/2023 7.9 (A)   09/15/2014 6.0

## 2023-05-22 NOTE — Unmapped (Signed)
[facility] Pharm[facility] Refill Coordination Note    Specialty Medication(s) to be Shipped:   Inflammatory Disorders: Humira    Other medication(s) to be shipped: No additional medications requested for fill at this time     Logan Quinn., DOB: Nov 02, 1980  Phone: There are no phone numbers on file.      All above HIPAA information was verified with patient.     Was a Nurse, learning disability used for this call? No    Completed refill call assessment today to schedule patient's medication shipment from the [facility]  [phone]).  All relevant notes have been reviewed.     Specialty medication(s) and dose(s) confirmed: Regimen is correct and unchanged.   Changes to medications: Logan Quinn reports no changes at this time.  Changes to insurance: No  New side effects reported not previously addressed with a pharmacist or physician: None reported  Questions for the pharmacist: No    Confirmed patient received a Conservation officer, historic buildings and a Surveyor, mining with first shipment. The patient will receive a drug information handout for each medication shipped and additional FDA Medication Guides as required.       DISEASE/MEDICATION-SPECIFIC INFORMATION        For patients on injectable medications: Patient currently has 0 doses left.  Next injection is scheduled for 12/12.    SPECIALTY MEDICATION ADHERENCE     Medication Adherence    Patient reported X missed doses in the last month: 0  Specialty Medication: adalimumab (HUMIRA,CF, PEN) 80 mg/0.8 mL PnKt  Patient is on additional specialty medications: No  Informant: patient              Were doses missed due to medication being on hold? No    Humira 80/0.8 mg/ml: 0 doses of medicine on hand     REFERRAL TO PHARMACIST     Referral to the pharmacist: Not needed      [facility]     Shipping address confirmed in Epic.       Delivery Scheduled: Yes, Expected medication delivery date: 12/12.     Medication will be delivered via Same Day Courier to the prescription address in Epic WAM.    Logan Quinn Specialty and [facility]

## 2023-05-23 DIAGNOSIS — Z794 Long term (current) use of insulin: Principal | ICD-10-CM

## 2023-05-23 DIAGNOSIS — E1165 Type 2 diabetes mellitus with hyperglycemia: Principal | ICD-10-CM

## 2023-05-23 MED ORDER — HUMALOG U-100 INSULIN 100 UNIT/ML SUBCUTANEOUS SOLUTION
0 refills | 0.00 days
Start: 2023-05-23 — End: 2024-05-21

## 2023-05-23 MED ORDER — LANTUS U-100 INSULIN 100 UNIT/ML SUBCUTANEOUS SOLUTION
Freq: Every evening | SUBCUTANEOUS | 0 refills | 200.00 days
Start: 2023-05-23 — End: 2024-05-21

## 2023-05-25 MED ORDER — HUMALOG U-100 INSULIN 100 UNIT/ML SUBCUTANEOUS SOLUTION
3 refills | 0.00 days
Start: 2023-05-25 — End: 2024-05-21

## 2023-05-25 MED ORDER — LANTUS U-100 INSULIN 100 UNIT/ML SUBCUTANEOUS SOLUTION
Freq: Every evening | SUBCUTANEOUS | 0 refills | 200.00 days
Start: 2023-05-25 — End: 2024-05-23

## 2023-05-26 ENCOUNTER — Ambulatory Visit (INDEPENDENT_AMBULATORY_CARE_PROVIDER_SITE_OTHER): Payer: 59 | Admitting: Podiatry

## 2023-05-26 ENCOUNTER — Ambulatory Visit (INDEPENDENT_AMBULATORY_CARE_PROVIDER_SITE_OTHER): Payer: 59

## 2023-05-26 ENCOUNTER — Encounter: Payer: Self-pay | Admitting: Podiatry

## 2023-05-26 DIAGNOSIS — M722 Plantar fascial fibromatosis: Secondary | ICD-10-CM

## 2023-05-26 MED ORDER — LANTUS U-100 INSULIN 100 UNIT/ML SUBCUTANEOUS SOLUTION
Freq: Every evening | SUBCUTANEOUS | 0 refills | 200 days | Status: CP
Start: 2023-05-26 — End: 2024-05-24

## 2023-05-26 MED ORDER — HUMALOG U-100 INSULIN 100 UNIT/ML SUBCUTANEOUS SOLUTION
3 refills | 0.00 days | Status: CP
Start: 2023-05-26 — End: 2024-05-21

## 2023-05-26 MED ORDER — TRIAMCINOLONE ACETONIDE 40 MG/ML IJ SUSP
20.0000 mg | Freq: Once | INTRAMUSCULAR | Status: AC
  Administered 2023-05-26: 20 mg

## 2023-05-26 NOTE — Progress Notes (Signed)
He presents today chief complaint of plantar heel pain left.  Has a history of plantar fasciitis evidence treated did discuss surgery but BMI was too high so they would not do the surgery.  He had a flareup again about a month ago he states that has been intense all day tried NSAIDs and pain gels Biofreeze and he is using a cane currently.  Objective: Also stable oriented x 3 severe pain on palpation medial calcaneal tubercle.  Radiographs taken today do not demonstrate any type of acute findings soft tissue increase in density plantar fascial cannula insertion site.  Assessment: Plantar fasciitis left heel.  Plan: Injected left heel today placed him in a plantar fascia brace we will follow-up with him on an as-needed basis.

## 2023-05-26 NOTE — Unmapped (Signed)
Patient is requesting the following refill  Requested Prescriptions     Pending Prescriptions Disp Refills    insulin glargine (LANTUS U-100 INSULIN) 100 unit/mL injection 10 mL 0     Sig: Inject 0.05 mL (5 Units total) under the skin nightly.    HUMALOG U-100 INSULIN 100 unit/mL injection 100 mL 3     Sig: INJECT 120 UNITS UNDER THE SKIN DAILY VIA OMNIPOD       Recent Visits  Date Type Provider Dept   04/23/23 Office Visit Loran Senters, FNP [facility] [address] [facility]   03/05/23 Office Visit Loran Senters, FNP [facility] [address] At [facility]   11/26/22 Office Visit Loran Senters, FNP [facility] [address] At [facility]   08/21/22 Office Visit Loran Senters, FNP [facility] [address] At [facility]   07/31/22 Office Visit Loran Senters, FNP [facility] [address] At [facility]   07/02/22 Office Visit Loran Senters, FNP [facility] [address] At [facility]   Showing recent visits within past 365 days and meeting all other requirements  Future Appointments  Date Type Provider Dept   06/13/23 Appointment Loran Senters, FNP [facility] [address] [facility]   Showing future appointments within next 365 days and meeting all other requirements       Labs: A1c:   Hemoglobin A1C (%)   Date Value   03/05/2023 7.9 (A)   09/15/2014 6.0

## 2023-05-28 ENCOUNTER — Encounter: Payer: Self-pay | Admitting: Podiatry

## 2023-06-05 MED ORDER — TESTOSTERONE CYPIONATE 200 MG/ML INTRAMUSCULAR OIL
INTRAMUSCULAR | 0 refills | 0.00 days
Start: 2023-06-05 — End: ?

## 2023-06-09 ENCOUNTER — Ambulatory Visit: Payer: 59 | Admitting: Podiatry

## 2023-06-13 NOTE — Unmapped (Unsigned)
Assessment and Plan:     Type 2 diabetes mellitus with hyperglycemia, with long-term current use of insulin (CMS-HCC)  ***    Morbid (severe) obesity due to excess calories (CMS-HCC)  ***         I personally spent *** minutes face-to-face and non-face-to-face in the care of this patient, which includes all pre, intra, and post visit time on the date of service.    No follow-ups on file.    HPI:      Logan Quinn. is here for No chief complaint on file.    {kerichronicdx:74702}    {keriacutedx:74703}     ROS:      Comprehensive 10 point ROS negative unless otherwise stated in the HPI.      [facility] Components:     Medication adherence and barriers to the treatment plan have been addressed. Opportunities to optimize healthy behaviors have been discussed. Patient / caregiver voiced understanding.    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)     Obstructive sleep apnea     OSA on CPAP     Retinal detachment 04/2014    Severe obstructive sleep apnea     Trapezius muscle strain 12/07/2013    Urinary incontinence, nocturnal enuresis     Venous insufficiency      Past Surgical History:   Procedure Laterality Date    EYE SURGERY  04/2014    LASER ABLATION INCOMPETENT VEIN INI [facility] HISTORICAL RESULT) Right     PR COLONOSCOPY FLX DX W/COLLJ SPEC WHEN PFRMD N/A 03/24/2013    Procedure: COLONOSCOPY, FLEXIBLE, PROXIMAL TO SPLENIC FLEXURE; DIAGNOSTIC, W/WO COLLECTION SPECIMEN BY BRUSH OR WASH;  Surgeon: Clint Bolder, MD; Location: [facility];  Service: Gastroenterology    PR EYE SURG POST SGMT PROC UNLISTED Left     pneumatic retinopexy OS    PR UPPER GI ENDOSCOPY,DIAGNOSIS N/A 02/02/2013    Procedure: UGI ENDO, INCLUDE ESOPHAGUS, STOMACH, & DUODENUM &/OR JEJUNUM; DX W/WO COLLECTION SPECIMN, BY BRUSH OR WASH;  Surgeon: Malcolm Metro, MD;  Location: [facility];  Service: Gastroenterology    SKIN BIOPSY      [place] PYLORIC STENOSIS ([place] HISTORICAL RESULT)         Family History:     Family History   Problem Relation Age of Onset    Cancer Maternal Grandfather         Stomach Cancer    Hearing loss Maternal Grandfather     Cancer Paternal Grandfather         Bone Cancer, Lung Cancer    COPD Paternal Grandmother     Arthritis Paternal Grandmother     Depression Paternal Grandmother     Diabetes Mother     Heart disease  Mother     Migraines Mother     Arthritis Mother     Depression Mother     GER disease Mother     Hypertension Mother     Angina Mother     COPD Mother     Glaucoma Mother     Hearing loss Mother     Cataracts Mother     Diabetes Sister     Migraines Sister     Asthma Sister     Depression Sister     Hearing loss Sister     Diabetes Brother     Asthma Brother     Diabetes Maternal Grandmother     Heart disease Maternal Grandmother     Migraines Maternal Grandmother     Depression Maternal Grandmother     Angina Maternal Grandmother     Hypertension Maternal Grandmother     Stroke Maternal Grandmother     Diabetes Maternal Uncle     Hearing loss Maternal Uncle     Diabetes Maternal Uncle         resulted in need for kidney transplant    Liver disease Maternal Uncle     Kidney disease Maternal Uncle         needed kidney transplant    Asthma Brother     No Known Problems Father     No Known Problems Paternal Aunt     No Known Problems Paternal Uncle     No Known Problems Other     Colorectal Cancer Neg Hx     Esophageal cancer Neg Hx     Liver cancer Neg Hx     Pancreatic cancer Neg Hx Stomach cancer Neg Hx     Amblyopia Neg Hx     Blindness Neg Hx     Retinal detachment Neg Hx     Strabismus Neg Hx     Macular degeneration Neg Hx     Anesthesia problems Neg Hx     Broken bones Neg Hx     Clotting disorder Neg Hx     Collagen disease Neg Hx     Dislocations Neg Hx     Fibromyalgia Neg Hx     Gout Neg Hx     Hemophilia Neg Hx     Osteoporosis Neg Hx     Rheumatologic disease Neg Hx     Scoliosis Neg Hx     Severe sprains Neg Hx     Sickle cell anemia Neg Hx     Spinal Compression Fracture Neg Hx     Melanoma Neg Hx     Basal cell carcinoma Neg Hx     Squamous cell carcinoma Neg Hx     Deep vein thrombosis Neg Hx     Thyroid disease Neg Hx        Social History:     Social History     Tobacco Use    Smoking status: Never     Passive exposure: Never    Smokeless tobacco: Never   Vaping Use    Vaping status: Never Used   Substance Use Topics    Alcohol use: Never     Comment: rare social    Drug use: Never       Allergies:     Erythromycin, Penicillins, Sulfa (sulfonamide antibiotics), Other, and Metronidazole    Current Medications:     Current Outpatient Medications   Medication Sig Dispense Refill    adalimumab (HUMIRA,CF, PEN) 80 mg/0.8  mL PnKt Inject 80 mg subcutaneously once every week 4 each 11    albuterol 2.5 mg /3 mL (0.083 %) nebulizer solution Inhale 3 mL (2.5 mg total) by nebulization every four (4) hours as needed for wheezing. (Patient not taking: Reported on 08/21/2022) 180 mL 2    albuterol HFA 90 mcg/actuation inhaler Inhale 2 puffs every six (6) hours as needed for wheezing. (Patient not taking: Reported on 04/23/2023) 8.5 g 5    allopurinol (ZYLOPRIM) 100 MG tablet Take 2 tablets (200 mg total) by mouth daily. 180 tablet 1    ALPRAZolam (XANAX) 0.5 MG tablet Takes 1 tablet nightly and 1 tablet a day when needed      ascorbic acid (VITAMIN C ORAL) Take 1 capsule by mouth nightly.      atorvastatin (LIPITOR) 20 MG tablet Take 1 tablet (20 mg total) by mouth daily. 90 tablet 3    blood sugar diagnostic (ACCU-CHEK GUIDE TEST STRIPS) Strp USE TO CHECK BLOOD SUGAR 3 TIMES DAILY BEFORE MEALS      blood-glucose meter kit Use as directed 1 each 0    budesonide-formoterol (SYMBICORT) 80-4.5 mcg/actuation inhaler Inhale 2 puffs two (2) times a day. (Patient not taking: Reported on 04/23/2023) 10.2 g 11    clindamycin (CLEOCIN T) 1 % lotion Apply topically two (2) times a day. To affected areas on body as needed for flares 60 mL 3    clobetasoL (TEMOVATE) 0.05 % ointment Apply daily to painful affected areas if needed for flares, then stop 15 g 1    colchicine (MITIGARE) 0.6 mg cap capsule Take 1 capsule (0.6 mg total) by mouth daily. (Patient not taking: Reported on 04/23/2023) 90 capsule 0    cyclobenzaprine (FLEXERIL) 10 MG tablet Take 1 tablet (10 mg total) by mouth Three (3) times a day as needed for muscle spasms. 60 tablet 1    dextroamphetamine sulfate (DEXTROSTAT) 10 MG tablet Take 2 tablets (20 mg total) by mouth two (2) times a day.      dicyclomine (BENTYL) 10 mg capsule Take 1 capsule (10 mg total) by mouth Four (4) times a day (before meals and nightly). 120 capsule 1    diphenoxylate-atropine (LOMOTIL) 2.5-0.025 mg per tablet Take 1 tablet by mouth four (4) times a day as needed for diarrhea. 30 tablet 1    DULoxetine (CYMBALTA) 60 MG capsule Take 1 capsule (60 mg total) by mouth Two (2) times a day. 180 capsule 0    empty container Misc Use as directed to dispose of needles. When full, make sure lid is closed tightly then dispose of container in trash. 1 each 2    erenumab-aooe (AIMOVIG AUTOINJECTOR) auto-injector Inject 1 Pen under the skin every thirty (30) days. 3 mL 1    famotidine (PEPCID) 20 MG tablet Take 1 tablet (20 mg total) by mouth two (2) times a day. 180 tablet 3    fluconazole (DIFLUCAN) 150 MG tablet Take 1 tablet (150 mg total) by mouth daily. Take 1 tablet then repeat 1 tablet in 5 days. 2 tablet 0    fluocinonide (LIDEX) 0.05 % gel Apply 1 Application topically Three (3) times a day. (Patient not taking: Reported on 04/23/2023)      fluoride, sodium, 1.1 % Pste       fluticasone propionate (FLONASE) 50 mcg/actuation nasal spray 1 spray into each nostril two (2) times a day. 16 g 5    glipiZIDE (GLUCOTROL) 10 MG tablet Take 2 tablets (20 mg total)  by mouth Two (2) times a day (30 minutes before a meal). 360 tablet 3    HUMALOG U-100 INSULIN 100 unit/mL injection INJECT 120 UNITS UNDER THE SKIN DAILY VIA OMNIPOD 100 mL 3    hydrocortisone (PROCTOSOL HC) 2.5 % rectal cream Insert 1 Application into the rectum two (2) times a day as needed. 28.35 g 2    hydrOXYzine (ATARAX) 25 MG tablet Take 1 tablet (25 mg total) by mouth Three (3) times a day as needed. May take two before bed for sleep. 120 tablet 1    ibuprofen (ADVIL,MOTRIN) 800 MG tablet Take 1 tablet (800 mg total) by mouth every eight (8) hours as needed. 90 tablet 1    insulin glargine (LANTUS U-100 INSULIN) 100 unit/mL injection Inject 0.05 mL (5 Units total) under the skin nightly. 10 mL 0    insulin pump cart,automated,BT (OMNIPOD 5 G6 PODS, GEN 5,) Crtg Change POD every 48 hours. 3 each 11    insulin syringe-needle U-100 (BD INSULIN SYRINGE ULTRA-FINE) 1 mL 31 gauge x 5/16 (8 mm) Syrg Once daily for insulin dosing. 100 each 1    insulin syringe-needle U-100 1 mL 31 gauge x 5/16 (8 mm) Syrg USE AS DIRECTED ONE TIME DAILY FOR INSULIN DOSING      ketoconazole (NIZORAL) 2 % cream Apply 1 Application topically daily. To affected area on body until clear 60 g 3    lancets (ACCU-CHEK FASTCLIX LANCET DRUM) Misc Use to check blood sugars 3 times a day before meals or as directed 200 each 5    levothyroxine (SYNTHROID) 50 MCG tablet Take 1 tablet (50 mcg total) by mouth daily. 90 tablet 1    lidocaine (XYLOCAINE) 5 % ointment Apply topically Three (3) times a day as needed. 30 g 1    LIDOCAINE 2 % solution Swish and spit three times a day as needed (Patient not taking: Reported on 04/23/2023)      losartan (COZAAR) 25 MG tablet Take 1 tablet (25 mg total) by mouth daily. 90 tablet 3    montelukast (SINGULAIR) 10 mg tablet Take 1 tablet (10 mg total) by mouth daily. 90 tablet 3    multivitamin (MULTIPLE VITAMIN ORAL) Take 1 tablet by mouth daily.      ondansetron (ZOFRAN) 4 MG tablet Take 1 tablet (4 mg total) by mouth every eight (8) hours as needed for nausea. 30 tablet 1    oxybutynin (DITROPAN) 5 MG tablet Take 1 tablet (5 mg total) by mouth two (2) times a day. 180 tablet 1    polyethylene glycol (GLYCOLAX) 17 gram/dose powder       propranoloL (INDERAL LA) 120 mg 24 hr capsule Take 1 capsule (120 mg total) by mouth daily. 90 capsule 1    ramelteon (ROZEREM) 8 mg tablet Take 1 tablet (8 mg total) by mouth nightly.      safety needles (BD SAFETYGLIDE NEEDLE) 18 gauge x 1 1/2 Ndle USE ONE NEEDLE ONCE WEEKLY      safety needles (BD SAFETYGLIDE NEEDLE) 23 gauge x 1 Ndle 1 Units by Miscellaneous route once a week. 12 each 11    safety needles 18 gauge x 1 1/2 Ndle 1 Units by Miscellaneous route once a week. 12 each 11    semaglutide (OZEMPIC) 1 mg/dose (4 mg/3 mL) PnIj injection Inject 1 mg under the skin every seven (7) days. 3 mL 2    sour cherry extract (TART CHERRY EXTRACT) 1,000 mg cap  syringe with needle (BD LUER-LOK SYRINGE) 3 mL 21 gauge x 1 1/2 Syrg To draw up testosterone once weekly 100 each 0    syringe with needle (BD LUER-LOK SYRINGE) 3 mL 23 gauge x 1 1/2 Syrg 1 each by Miscellaneous route once a week. To administer testosterone 50 each 0    tadalafil (CIALIS) 5 MG tablet Take 1 tablet (5 mg total) by mouth in the morning. 90 tablet 3    testosterone cypionate (DEPOTESTOTERONE CYPIONATE) 200 mg/mL injection Inject 0.5 mL (100 mg total) into the muscle once a week. Inject 0.5cc (100mg ) weekly 2 mL 5    topiramate (TOPAMAX) 50 MG tablet Take 1.5 tablets (75 mg total) by mouth two (2) times a day. 270 tablet 3    WELLBUTRIN XL 150 mg 24 hr tablet Take 1 tablet (150 mg total) by mouth every morning.       No current facility-administered medications for this visit.       Health Maintenance:     Health Maintenance   Topic Date Due    COVID-19 Vaccine (3 - Pfizer risk series) 01/24/2021    Retinal Eye Exam  10/31/2022    Hemoglobin A1c  06/04/2023    DTaP/Tdap/Td Vaccines (4 - Td or Tdap) 07/17/2023    Foot Exam  11/25/2023    Urine Albumin/Creatinine Ratio  11/26/2023    Serum Creatinine Monitoring  03/04/2024    Potassium Monitoring  03/04/2024    Pneumococcal Vaccine 0-64  Completed    Hepatitis C Screen  Completed    Influenza Vaccine  Completed       Immunizations:     Immunization History   Administered Date(s) Administered    COVID-19 VACC,MRNA,(PFIZER)(PF) 12/04/2020, 12/27/2020    HEPATITIS B VACCINE ADULT,IM(ENERGIX B, RECOMBIVAX) 03/05/2013, 04/05/2013, 09/03/2013    Hepatitis B Vaccine, Unspecified Formulation 12/27/1999, 04/29/2000    INFLUENZA INJ MDCK PF, QUAD,(FLUCELVAX)(1MO AND UP EGG FREE) 03/31/2019    INFLUENZA TIV (TRI) PF (IM)(HISTORICAL) 10/08/2011    INFLUENZA VACCINE IIV3(IM)(PF)6 MOS UP 03/05/2023    Influenza Vaccine Quad(IM)6 MO-Adult(PF) 03/05/2013, 03/18/2014, 03/22/2015, 03/05/2016, 03/19/2017, 02/23/2018, 02/19/2022    Influenza Virus Vaccine, unspecified formulation 03/19/2017, 02/23/2018, 03/25/2019, 03/10/2020, 04/05/2021    PNEUMOCOCCAL POLYSACCHARIDE 23-VALENT 12/30/2012    PPD Test 10/28/2016    Pneumococcal Conjugate 20-valent 01/02/2022    TD(TDVAX),ADSORBED,2LF(IM)(PF) 04/29/2000    TdaP 07/18/2008, 07/16/2013     I have reviewed and (if needed) updated the patient's problem list, medications, allergies, past medical and surgical history, social and family history.     Vital Signs:     Wt Readings from Last 3 Encounters:   04/23/23 (!) 206.8 kg (456 lb)   03/18/23 (!) 206.9 kg (456 lb 3.2 oz)   03/05/23 (!) 205.5 kg (453 lb)     Temp Readings from Last 3 Encounters:   04/23/23 37 ??C (98.6 ??F) (Oral)   03/05/23 36.3 ??C (97.3 ??F)   12/31/22 36.9 ??C (98.5 ??F) (Temporal)     BP Readings from Last 3 Encounters:   04/23/23 126/84   03/18/23 144/84   03/05/23 118/76     Pulse Readings from Last 3 Encounters:   04/23/23 98   03/18/23 69   03/05/23 62     Estimated body mass index is 63.6 kg/m?? as calculated from the following:    Height as of 04/23/23: 180.3 cm (5' 11).    Weight as of 04/23/23: 206.8 kg (456 lb).  No height and weight on file for this encounter.  Objective:      General: Alert and oriented x3. Well-appearing. No acute distress. ***  HEENT:  Normocephalic.  Atraumatic. Conjunctiva and sclera normal. OP MMM without lesions. ***  Neck:  Supple. No thyroid enlargement. No adenopathy. ***  Heart:  Regular rate and rhythm. Normal S1, S2. No murmurs, rubs or gallops. ***  Lungs:  No respiratory distress.  Lungs clear to auscultation. No wheezes, rhonchi, or rales. ***  GI/GU:  Soft, +BS, nondistended, non-TTP. No palpable masses or organomegaly. ***  Extremities:  No edema. Peripheral pulses normal. ***  Skin:  Warm, dry. No rash or lesions present. ***  Neuro:  Non-focal. No obvious weakness. ***  Psych:  Affect normal, eye contact good, speech clear and coherent. ***     [name], FNP   Answers submitted by the patient for this visit:  Diabetes Questionnaire (Submitted on 06/06/2023)  Chief Complaint: Diabetes problem  Diabetes type: type 2  MedicAlert ID: No  Disease duration: 10 Years  blurred vision: No  chest pain: No  fatigue: No  foot paresthesias: No  foot ulcerations: No  polydipsia: Yes  polyphagia: No  polyuria: Yes  visual change: No  weakness: No  weight loss: No  Symptom course: worsening  confusion: No  speech difficulty: No  dizziness: No  nervous/anxious: No  headaches: No  hunger: Yes  mood changes: No  pallor: No  seizures: No  tremors: No  sleepiness: No  sweats: No  blackouts: No  hospitalization: No  nocturnal hypoglycemia: No  required assistance: No  required glucagon: No  CVA: No  heart disease: No  impotence: Yes  nephropathy: No  peripheral neuropathy: No  PVD: No  retinopathy: No  CAD risks: family history, obesity  Current treatments: diet, insulin injections, insulin pump, oral agent (monotherapy)  Treatment compliance: most of the time  Dose schedule: at bedtime  Given by: patient, significant other  Injection sites: abdominal wall, arms, thighs  Home blood tests: 5+ x per day  Monitoring compliance: good  Blood glucose trend: increasing steadily  breakfast time: 8-9 am  breakfast glucose level: 180-200  lunch time: 12-1 pm  lunch glucose level: 180-200  dinner time: 7-8 pm  dinner glucose level: >200  Bedtime: 10-11 pm  Bedtime glucose level: 180-200  High score: >200  Overall: >200  Weight trend: decreasing steadily  Current diet: diabetic, low fat/cholesterol  Meal planning: calorie counting, carbohydrate counting  Exercise: weekly  Dietitian visit: No  Eye exam current: No  Sees podiatrist: Yes

## 2023-06-20 NOTE — Unmapped (Unsigned)
Assessment and Plan:     Type 2 diabetes mellitus with hyperglycemia, with long-term current use of insulin (CMS-HCC)  ***         I personally spent *** minutes face-to-face and non-face-to-face in the care of this patient, which includes all pre, intra, and post visit time on the date of service.    No follow-ups on file.    HPI:      Logan Quinn. is here for No chief complaint on file.    {kerichronicdx:74702}    {keriacutedx:74703}     ROS:      Comprehensive 10 point ROS negative unless otherwise stated in the HPI.      [facility] Components:     Medication adherence and barriers to the treatment plan have been addressed. Opportunities to optimize healthy behaviors have been discussed. Patient / caregiver voiced understanding.    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)     Obstructive sleep apnea     OSA on CPAP     Retinal detachment 04/2014    Severe obstructive sleep apnea     Trapezius muscle strain 12/07/2013    Urinary incontinence, nocturnal enuresis     Venous insufficiency      Past Surgical History:   Procedure Laterality Date    EYE SURGERY  04/2014    LASER ABLATION INCOMPETENT VEIN INI [facility] HISTORICAL RESULT) Right     PR COLONOSCOPY FLX DX W/COLLJ SPEC WHEN PFRMD N/A 03/24/2013    Procedure: COLONOSCOPY, FLEXIBLE, PROXIMAL TO SPLENIC FLEXURE; DIAGNOSTIC, W/WO COLLECTION SPECIMEN BY BRUSH OR WASH;  Surgeon: Clint Bolder, MD;  [facility];  Service: Gastroenterology PR EYE SURG POST SGMT PROC UNLISTED Left     pneumatic retinopexy OS    PR UPPER GI ENDOSCOPY,DIAGNOSIS N/A 02/02/2013    Procedure: UGI ENDO, INCLUDE ESOPHAGUS, STOMACH, & DUODENUM &/OR JEJUNUM; DX W/WO COLLECTION SPECIMN, BY BRUSH OR WASH;  Surgeon: Malcolm Metro, MD;  Location: [facility];  Service: Gastroenterology    SKIN BIOPSY      [place] PYLORIC STENOSIS ([place] HISTORICAL RESULT)         Family History:     Family History   Problem Relation Age of Onset    Cancer Maternal Grandfather         Stomach Cancer    Hearing loss Maternal Grandfather     Cancer Paternal Grandfather         Bone Cancer, Lung Cancer    COPD Paternal Grandmother     Arthritis Paternal Grandmother     Depression Paternal Grandmother     Diabetes Mother     Heart disease Mother     Migraines Mother     Arthritis Mother  Depression Mother     GER disease Mother     Hypertension Mother     Angina Mother     COPD Mother     Glaucoma Mother     Hearing loss Mother     Cataracts Mother     Diabetes Sister     Migraines Sister     Asthma Sister     Depression Sister     Hearing loss Sister     Diabetes Brother     Asthma Brother     Diabetes Maternal Grandmother     Heart disease Maternal Grandmother     Migraines Maternal Grandmother     Depression Maternal Grandmother     Angina Maternal Grandmother     Hypertension Maternal Grandmother     Stroke Maternal Grandmother     Diabetes Maternal Uncle     Hearing loss Maternal Uncle     Diabetes Maternal Uncle         resulted in need for kidney transplant    Liver disease Maternal Uncle     Kidney disease Maternal Uncle         needed kidney transplant    Asthma Brother     No Known Problems Father     No Known Problems Paternal Aunt     No Known Problems Paternal Uncle     No Known Problems Other     Colorectal Cancer Neg Hx     Esophageal cancer Neg Hx     Liver cancer Neg Hx     Pancreatic cancer Neg Hx     Stomach cancer Neg Hx     Amblyopia Neg Hx     Blindness Neg Hx Retinal detachment Neg Hx     Strabismus Neg Hx     Macular degeneration Neg Hx     Anesthesia problems Neg Hx     Broken bones Neg Hx     Clotting disorder Neg Hx     Collagen disease Neg Hx     Dislocations Neg Hx     Fibromyalgia Neg Hx     Gout Neg Hx     Hemophilia Neg Hx     Osteoporosis Neg Hx     Rheumatologic disease Neg Hx     Scoliosis Neg Hx     Severe sprains Neg Hx     Sickle cell anemia Neg Hx     Spinal Compression Fracture Neg Hx     Melanoma Neg Hx     Basal cell carcinoma Neg Hx     Squamous cell carcinoma Neg Hx     Deep vein thrombosis Neg Hx     Thyroid disease Neg Hx        Social History:     Social History     Tobacco Use    Smoking status: Never     Passive exposure: Never    Smokeless tobacco: Never   Vaping Use    Vaping status: Never Used   Substance Use Topics    Alcohol use: Never     Comment: rare social    Drug use: Never       Allergies:     Erythromycin, Penicillins, Sulfa (sulfonamide antibiotics), Other, and Metronidazole    Current Medications:     Current Outpatient Medications   Medication Sig Dispense Refill    adalimumab (HUMIRA,CF, PEN) 80 mg/0.8 mL PnKt Inject 80 mg subcutaneously once every week 4 each 11    albuterol 2.5  mg /3 mL (0.083 %) nebulizer solution Inhale 3 mL (2.5 mg total) by nebulization every four (4) hours as needed for wheezing. (Patient not taking: Reported on 08/21/2022) 180 mL 2    albuterol HFA 90 mcg/actuation inhaler Inhale 2 puffs every six (6) hours as needed for wheezing. (Patient not taking: Reported on 04/23/2023) 8.5 g 5    allopurinol (ZYLOPRIM) 100 MG tablet Take 2 tablets (200 mg total) by mouth daily. 180 tablet 1    ALPRAZolam (XANAX) 0.5 MG tablet Takes 1 tablet nightly and 1 tablet a day when needed      ascorbic acid (VITAMIN C ORAL) Take 1 capsule by mouth nightly.      atorvastatin (LIPITOR) 20 MG tablet Take 1 tablet (20 mg total) by mouth daily. 90 tablet 3    blood sugar diagnostic (ACCU-CHEK GUIDE TEST STRIPS) Strp USE TO CHECK BLOOD SUGAR 3 TIMES DAILY BEFORE MEALS      blood-glucose meter kit Use as directed 1 each 0    budesonide-formoterol (SYMBICORT) 80-4.5 mcg/actuation inhaler Inhale 2 puffs two (2) times a day. (Patient not taking: Reported on 04/23/2023) 10.2 g 11    clindamycin (CLEOCIN T) 1 % lotion Apply topically two (2) times a day. To affected areas on body as needed for flares 60 mL 3    clobetasoL (TEMOVATE) 0.05 % ointment Apply daily to painful affected areas if needed for flares, then stop 15 g 1    colchicine (MITIGARE) 0.6 mg cap capsule Take 1 capsule (0.6 mg total) by mouth daily. (Patient not taking: Reported on 04/23/2023) 90 capsule 0    cyclobenzaprine (FLEXERIL) 10 MG tablet Take 1 tablet (10 mg total) by mouth Three (3) times a day as needed for muscle spasms. 60 tablet 1    dextroamphetamine sulfate (DEXTROSTAT) 10 MG tablet Take 2 tablets (20 mg total) by mouth two (2) times a day.      dicyclomine (BENTYL) 10 mg capsule Take 1 capsule (10 mg total) by mouth Four (4) times a day (before meals and nightly). 120 capsule 1    diphenoxylate-atropine (LOMOTIL) 2.5-0.025 mg per tablet Take 1 tablet by mouth four (4) times a day as needed for diarrhea. 30 tablet 1    DULoxetine (CYMBALTA) 60 MG capsule Take 1 capsule (60 mg total) by mouth Two (2) times a day. 180 capsule 0    empty container Misc Use as directed to dispose of needles. When full, make sure lid is closed tightly then dispose of container in trash. 1 each 2    erenumab-aooe (AIMOVIG AUTOINJECTOR) auto-injector Inject 1 Pen under the skin every thirty (30) days. 3 mL 1    famotidine (PEPCID) 20 MG tablet Take 1 tablet (20 mg total) by mouth two (2) times a day. 180 tablet 3    fluconazole (DIFLUCAN) 150 MG tablet Take 1 tablet (150 mg total) by mouth daily. Take 1 tablet then repeat 1 tablet in 5 days. 2 tablet 0    fluocinonide (LIDEX) 0.05 % gel Apply 1 Application topically Three (3) times a day. (Patient not taking: Reported on 04/23/2023) fluoride, sodium, 1.1 % Pste       fluticasone propionate (FLONASE) 50 mcg/actuation nasal spray 1 spray into each nostril two (2) times a day. 16 g 5    glipiZIDE (GLUCOTROL) 10 MG tablet Take 2 tablets (20 mg total) by mouth Two (2) times a day (30 minutes before a meal). 360 tablet 3    HUMALOG U-100 INSULIN 100  unit/mL injection INJECT 120 UNITS UNDER THE SKIN DAILY VIA OMNIPOD 100 mL 3    hydrocortisone (PROCTOSOL HC) 2.5 % rectal cream Insert 1 Application into the rectum two (2) times a day as needed. 28.35 g 2    hydrOXYzine (ATARAX) 25 MG tablet Take 1 tablet (25 mg total) by mouth Three (3) times a day as needed. May take two before bed for sleep. 120 tablet 1    ibuprofen (ADVIL,MOTRIN) 800 MG tablet Take 1 tablet (800 mg total) by mouth every eight (8) hours as needed. 90 tablet 1    insulin glargine (LANTUS U-100 INSULIN) 100 unit/mL injection Inject 0.05 mL (5 Units total) under the skin nightly. 10 mL 0    insulin pump cart,automated,BT (OMNIPOD 5 G6 PODS, GEN 5,) Crtg Change POD every 48 hours. 3 each 11    insulin syringe-needle U-100 (BD INSULIN SYRINGE ULTRA-FINE) 1 mL 31 gauge x 5/16 (8 mm) Syrg Once daily for insulin dosing. 100 each 1    insulin syringe-needle U-100 1 mL 31 gauge x 5/16 (8 mm) Syrg USE AS DIRECTED ONE TIME DAILY FOR INSULIN DOSING      ketoconazole (NIZORAL) 2 % cream Apply 1 Application topically daily. To affected area on body until clear 60 g 3    lancets (ACCU-CHEK FASTCLIX LANCET DRUM) Misc Use to check blood sugars 3 times a day before meals or as directed 200 each 5    levothyroxine (SYNTHROID) 50 MCG tablet Take 1 tablet (50 mcg total) by mouth daily. 90 tablet 1    lidocaine (XYLOCAINE) 5 % ointment Apply topically Three (3) times a day as needed. 30 g 1    LIDOCAINE 2 % solution Swish and spit three times a day as needed (Patient not taking: Reported on 04/23/2023)      losartan (COZAAR) 25 MG tablet Take 1 tablet (25 mg total) by mouth daily. 90 tablet 3 montelukast (SINGULAIR) 10 mg tablet Take 1 tablet (10 mg total) by mouth daily. 90 tablet 3    multivitamin (MULTIPLE VITAMIN ORAL) Take 1 tablet by mouth daily.      ondansetron (ZOFRAN) 4 MG tablet Take 1 tablet (4 mg total) by mouth every eight (8) hours as needed for nausea. 30 tablet 1    oxybutynin (DITROPAN) 5 MG tablet Take 1 tablet (5 mg total) by mouth two (2) times a day. 180 tablet 1    polyethylene glycol (GLYCOLAX) 17 gram/dose powder       propranoloL (INDERAL LA) 120 mg 24 hr capsule Take 1 capsule (120 mg total) by mouth daily. 90 capsule 1    ramelteon (ROZEREM) 8 mg tablet Take 1 tablet (8 mg total) by mouth nightly.      safety needles (BD SAFETYGLIDE NEEDLE) 18 gauge x 1 1/2 Ndle USE ONE NEEDLE ONCE WEEKLY      safety needles (BD SAFETYGLIDE NEEDLE) 23 gauge x 1 Ndle 1 Units by Miscellaneous route once a week. 12 each 11    safety needles 18 gauge x 1 1/2 Ndle 1 Units by Miscellaneous route once a week. 12 each 11    semaglutide (OZEMPIC) 1 mg/dose (4 mg/3 mL) PnIj injection Inject 1 mg under the skin every seven (7) days. 3 mL 2    sour cherry extract (TART CHERRY EXTRACT) 1,000 mg cap       syringe with needle (BD LUER-LOK SYRINGE) 3 mL 21 gauge x 1 1/2 Syrg To draw up testosterone once weekly 100 each 0  syringe with needle (BD LUER-LOK SYRINGE) 3 mL 23 gauge x 1 1/2 Syrg 1 each by Miscellaneous route once a week. To administer testosterone 50 each 0    tadalafil (CIALIS) 5 MG tablet Take 1 tablet (5 mg total) by mouth in the morning. 90 tablet 3    testosterone cypionate (DEPOTESTOTERONE CYPIONATE) 200 mg/mL injection Inject 0.5 mL (100 mg total) into the muscle once a week. Inject 0.5cc (100mg ) weekly 2 mL 5    topiramate (TOPAMAX) 50 MG tablet Take 1.5 tablets (75 mg total) by mouth two (2) times a day. 270 tablet 3    WELLBUTRIN XL 150 mg 24 hr tablet Take 1 tablet (150 mg total) by mouth every morning.       No current facility-administered medications for this visit. Health Maintenance:     Health Maintenance   Topic Date Due    COVID-19 Vaccine (3 - Pfizer risk series) 01/24/2021    Retinal Eye Exam  10/31/2022    Hemoglobin A1c  06/04/2023    DTaP/Tdap/Td Vaccines (4 - Td or Tdap) 07/17/2023    Foot Exam  11/25/2023    Urine Albumin/Creatinine Ratio  11/26/2023    Serum Creatinine Monitoring  03/04/2024    Potassium Monitoring  03/04/2024    Pneumococcal Vaccine 0-64  Completed    Hepatitis C Screen  Completed    Influenza Vaccine  Completed       Immunizations:     Immunization History   Administered Date(s) Administered    COVID-19 VACC,MRNA,(PFIZER)(PF) 12/04/2020, 12/27/2020    HEPATITIS B VACCINE ADULT,IM(ENERGIX B, RECOMBIVAX) 03/05/2013, 04/05/2013, 09/03/2013    Hepatitis B Vaccine, Unspecified Formulation 12/27/1999, 04/29/2000    INFLUENZA INJ MDCK PF, QUAD,(FLUCELVAX)(37MO AND UP EGG FREE) 03/31/2019    INFLUENZA TIV (TRI) PF (IM)(HISTORICAL) 10/08/2011    INFLUENZA VACCINE IIV3(IM)(PF)6 MOS UP 03/05/2023    Influenza Vaccine Quad(IM)6 MO-Adult(PF) 03/05/2013, 03/18/2014, 03/22/2015, 03/05/2016, 03/19/2017, 02/23/2018, 02/19/2022    Influenza Virus Vaccine, unspecified formulation 03/19/2017, 02/23/2018, 03/25/2019, 03/10/2020, 04/05/2021    PNEUMOCOCCAL POLYSACCHARIDE 23-VALENT 12/30/2012    PPD Test 10/28/2016    Pneumococcal Conjugate 20-valent 01/02/2022    TD(TDVAX),ADSORBED,2LF(IM)(PF) 04/29/2000    TdaP 07/18/2008, 07/16/2013     I have reviewed and (if needed) updated the patient's problem list, medications, allergies, past medical and surgical history, social and family history.     Vital Signs:     Wt Readings from Last 3 Encounters:   04/23/23 (!) 206.8 kg (456 lb)   03/18/23 (!) 206.9 kg (456 lb 3.2 oz)   03/05/23 (!) 205.5 kg (453 lb)     Temp Readings from Last 3 Encounters:   04/23/23 37 ??C (98.6 ??F) (Oral)   03/05/23 36.3 ??C (97.3 ??F)   12/31/22 36.9 ??C (98.5 ??F) (Temporal)     BP Readings from Last 3 Encounters:   04/23/23 126/84   03/18/23 144/84   03/05/23 118/76     Pulse Readings from Last 3 Encounters:   04/23/23 98   03/18/23 69   03/05/23 62     Estimated body mass index is 63.6 kg/m?? as calculated from the following:    Height as of 04/23/23: 180.3 cm (5' 11).    Weight as of 04/23/23: 206.8 kg (456 lb).  No height and weight on file for this encounter.      Objective:      General: Alert and oriented x3. Well-appearing. No acute distress. ***  HEENT:  Normocephalic.  Atraumatic. Conjunctiva and sclera normal. OP  MMM without lesions. ***  Neck:  Supple. No thyroid enlargement. No adenopathy. ***  Heart:  Regular rate and rhythm. Normal S1, S2. No murmurs, rubs or gallops. ***  Lungs:  No respiratory distress.  Lungs clear to auscultation. No wheezes, rhonchi, or rales. ***  GI/GU:  Soft, +BS, nondistended, non-TTP. No palpable masses or organomegaly. ***  Extremities:  No edema. Peripheral pulses normal. ***  Skin:  Warm, dry. No rash or lesions present. ***  Neuro:  Non-focal. No obvious weakness. ***  Psych:  Affect normal, eye contact good, speech clear and coherent. ***       I attest that I, [name], personally documented this note while acting as scribe for [name], FNP.      [name], Scribe.  06/20/2023     The documentation recorded by the scribe accurately reflects the service I personally performed and the decisions made by me.     [name], FNP   Answers submitted by the patient for this visit:  Diabetes Questionnaire (Submitted on 06/15/2023)  Chief Complaint: Diabetes problem  Diabetes type: type 2  MedicAlert ID: No  Disease duration: 10 Years  blurred vision: No  chest pain: No  fatigue: No  foot paresthesias: No  foot ulcerations: No  polydipsia: Yes  polyphagia: No  polyuria: Yes  visual change: No  weakness: No  weight loss: No  Symptom course: worsening  confusion: No  speech difficulty: No  dizziness: No  nervous/anxious: No  headaches: No  hunger: Yes  mood changes: No  pallor: No  seizures: No  tremors: No  sleepiness: No  sweats: No  blackouts: No  hospitalization: No  nocturnal hypoglycemia: No  required assistance: No  required glucagon: No  CVA: No  heart disease: No  impotence: Yes  nephropathy: No  peripheral neuropathy: No  PVD: No  retinopathy: No  CAD risks: family history, obesity  Current treatments: diet, insulin injections, insulin pump, oral agent (monotherapy)  Treatment compliance: most of the time  Dose schedule: at bedtime  Given by: patient, significant other  Injection sites: abdominal wall, arms, thighs  Home blood tests: 5+ x per day  Monitoring compliance: good  Blood glucose trend: increasing steadily  breakfast time: 8-9 am  breakfast glucose level: 180-200  lunch time: 12-1 pm  lunch glucose level: 180-200  dinner time: 7-8 pm  dinner glucose level: >200  Bedtime: 10-11 pm  Bedtime glucose level: 180-200  High score: >200  Overall: >200  Weight trend: decreasing steadily  Current diet: diabetic, low fat/cholesterol  Meal planning: calorie counting, carbohydrate counting  Exercise: weekly  Dietitian visit: No  Eye exam current: No  Sees podiatrist: Yes

## 2023-06-23 NOTE — Unmapped (Signed)
Logan Quinn reports he continues to do well on Humira and has had no big flares. I encouraged him to make a followup appt with dermatology clinic, as this is our last refill/due for followup in Feb.    [facility] Clinical Assessment & Refill Coordination Note    Logan Quinn., DOB: 06/30/1980  Phone: There are no phone numbers on file.    All above HIPAA information was verified with patient.     Was a Nurse, learning disability used for this call? No    Specialty Medication(s):   Inflammatory Disorders: Humira     Current Outpatient Medications   Medication Sig Dispense Refill    adalimumab (HUMIRA,CF, PEN) 80 mg/0.8 mL PnKt Inject 80 mg subcutaneously once every week 4 each 11    albuterol 2.5 mg /3 mL (0.083 %) nebulizer solution Inhale 3 mL (2.5 mg total) by nebulization every four (4) hours as needed for wheezing. (Patient not taking: Reported on 08/21/2022) 180 mL 2    albuterol HFA 90 mcg/actuation inhaler Inhale 2 puffs every six (6) hours as needed for wheezing. (Patient not taking: Reported on 04/23/2023) 8.5 g 5    allopurinol (ZYLOPRIM) 100 MG tablet Take 2 tablets (200 mg total) by mouth daily. 180 tablet 1    ALPRAZolam (XANAX) 0.5 MG tablet Takes 1 tablet nightly and 1 tablet a day when needed      ascorbic acid (VITAMIN C ORAL) Take 1 capsule by mouth nightly.      atorvastatin (LIPITOR) 20 MG tablet Take 1 tablet (20 mg total) by mouth daily. 90 tablet 3    blood sugar diagnostic (ACCU-CHEK GUIDE TEST STRIPS) Strp USE TO CHECK BLOOD SUGAR 3 TIMES DAILY BEFORE MEALS      blood-glucose meter kit Use as directed 1 each 0    budesonide-formoterol (SYMBICORT) 80-4.5 mcg/actuation inhaler Inhale 2 puffs two (2) times a day. (Patient not taking: Reported on 04/23/2023) 10.2 g 11    clindamycin (CLEOCIN T) 1 % lotion Apply topically two (2) times a day. To affected areas on body as needed for flares 60 mL 3    clobetasoL (TEMOVATE) 0.05 % ointment Apply daily to painful affected areas if needed for flares, then stop 15 g 1    colchicine (MITIGARE) 0.6 mg cap capsule Take 1 capsule (0.6 mg total) by mouth daily. (Patient not taking: Reported on 04/23/2023) 90 capsule 0    cyclobenzaprine (FLEXERIL) 10 MG tablet Take 1 tablet (10 mg total) by mouth Three (3) times a day as needed for muscle spasms. 60 tablet 1    dextroamphetamine sulfate (DEXTROSTAT) 10 MG tablet Take 2 tablets (20 mg total) by mouth two (2) times a day.      dicyclomine (BENTYL) 10 mg capsule Take 1 capsule (10 mg total) by mouth Four (4) times a day (before meals and nightly). 120 capsule 1    diphenoxylate-atropine (LOMOTIL) 2.5-0.025 mg per tablet Take 1 tablet by mouth four (4) times a day as needed for diarrhea. 30 tablet 1    DULoxetine (CYMBALTA) 60 MG capsule Take 1 capsule (60 mg total) by mouth Two (2) times a day. 180 capsule 0    empty container Misc Use as directed to dispose of needles. When full, make sure lid is closed tightly then dispose of container in trash. 1 each 2    erenumab-aooe (AIMOVIG AUTOINJECTOR) auto-injector Inject 1 Pen under the skin every thirty (30) days. 3 mL 1    famotidine (  PEPCID) 20 MG tablet Take 1 tablet (20 mg total) by mouth two (2) times a day. 180 tablet 3    fluconazole (DIFLUCAN) 150 MG tablet Take 1 tablet (150 mg total) by mouth daily. Take 1 tablet then repeat 1 tablet in 5 days. 2 tablet 0    fluocinonide (LIDEX) 0.05 % gel Apply 1 Application topically Three (3) times a day. (Patient not taking: Reported on 04/23/2023)      fluoride, sodium, 1.1 % Pste       fluticasone propionate (FLONASE) 50 mcg/actuation nasal spray 1 spray into each nostril two (2) times a day. 16 g 5    glipiZIDE (GLUCOTROL) 10 MG tablet Take 2 tablets (20 mg total) by mouth Two (2) times a day (30 minutes before a meal). 360 tablet 3    HUMALOG U-100 INSULIN 100 unit/mL injection INJECT 120 UNITS UNDER THE SKIN DAILY VIA OMNIPOD 100 mL 3    hydrocortisone (PROCTOSOL HC) 2.5 % rectal cream Insert 1 Application into the rectum two (2) times a day as needed. 28.35 g 2    hydrOXYzine (ATARAX) 25 MG tablet Take 1 tablet (25 mg total) by mouth Three (3) times a day as needed. May take two before bed for sleep. 120 tablet 1    ibuprofen (ADVIL,MOTRIN) 800 MG tablet Take 1 tablet (800 mg total) by mouth every eight (8) hours as needed. 90 tablet 1    insulin glargine (LANTUS U-100 INSULIN) 100 unit/mL injection Inject 0.05 mL (5 Units total) under the skin nightly. 10 mL 0    insulin pump cart,automated,BT (OMNIPOD 5 G6 PODS, GEN 5,) Crtg Change POD every 48 hours. 3 each 11    insulin syringe-needle U-100 (BD INSULIN SYRINGE ULTRA-FINE) 1 mL 31 gauge x 5/16 (8 mm) Syrg Once daily for insulin dosing. 100 each 1    insulin syringe-needle U-100 1 mL 31 gauge x 5/16 (8 mm) Syrg USE AS DIRECTED ONE TIME DAILY FOR INSULIN DOSING      ketoconazole (NIZORAL) 2 % cream Apply 1 Application topically daily. To affected area on body until clear 60 g 3    lancets (ACCU-CHEK FASTCLIX LANCET DRUM) Misc Use to check blood sugars 3 times a day before meals or as directed 200 each 5    levothyroxine (SYNTHROID) 50 MCG tablet Take 1 tablet (50 mcg total) by mouth daily. 90 tablet 1    lidocaine (XYLOCAINE) 5 % ointment Apply topically Three (3) times a day as needed. 30 g 1    LIDOCAINE 2 % solution Swish and spit three times a day as needed (Patient not taking: Reported on 04/23/2023)      losartan (COZAAR) 25 MG tablet Take 1 tablet (25 mg total) by mouth daily. 90 tablet 3    montelukast (SINGULAIR) 10 mg tablet Take 1 tablet (10 mg total) by mouth daily. 90 tablet 3    multivitamin (MULTIPLE VITAMIN ORAL) Take 1 tablet by mouth daily.      ondansetron (ZOFRAN) 4 MG tablet Take 1 tablet (4 mg total) by mouth every eight (8) hours as needed for nausea. 30 tablet 1    oxybutynin (DITROPAN) 5 MG tablet Take 1 tablet (5 mg total) by mouth two (2) times a day. 180 tablet 1    polyethylene glycol (GLYCOLAX) 17 gram/dose powder propranoloL (INDERAL LA) 120 mg 24 hr capsule Take 1 capsule (120 mg total) by mouth daily. 90 capsule 1    ramelteon (ROZEREM) 8 mg tablet Take 1 tablet (  8 mg total) by mouth nightly.      safety needles (BD SAFETYGLIDE NEEDLE) 18 gauge x 1 1/2 Ndle USE ONE NEEDLE ONCE WEEKLY      safety needles (BD SAFETYGLIDE NEEDLE) 23 gauge x 1 Ndle 1 Units by Miscellaneous route once a week. 12 each 11    safety needles 18 gauge x 1 1/2 Ndle 1 Units by Miscellaneous route once a week. 12 each 11    semaglutide (OZEMPIC) 1 mg/dose (4 mg/3 mL) PnIj injection Inject 1 mg under the skin every seven (7) days. 3 mL 2    sour cherry extract (TART CHERRY EXTRACT) 1,000 mg cap       syringe with needle (BD LUER-LOK SYRINGE) 3 mL 21 gauge x 1 1/2 Syrg To draw up testosterone once weekly 100 each 0    syringe with needle (BD LUER-LOK SYRINGE) 3 mL 23 gauge x 1 1/2 Syrg 1 each by Miscellaneous route once a week. To administer testosterone 50 each 0    tadalafil (CIALIS) 5 MG tablet Take 1 tablet (5 mg total) by mouth in the morning. 90 tablet 3    testosterone cypionate (DEPOTESTOTERONE CYPIONATE) 200 mg/mL injection Inject 0.5 mL (100 mg total) into the muscle once a week. Inject 0.5cc (100mg ) weekly 2 mL 5    topiramate (TOPAMAX) 50 MG tablet Take 1.5 tablets (75 mg total) by mouth two (2) times a day. 270 tablet 3    WELLBUTRIN XL 150 mg 24 hr tablet Take 1 tablet (150 mg total) by mouth every morning.       No current facility-administered medications for this visit.        Changes to medications: Dewight reports no changes at this time.    Allergies   Allergen Reactions    Erythromycin Nausea And Vomiting     Other reaction(s): Vomiting    Penicillins Rash, Swelling and Hives    Sulfa (Sulfonamide Antibiotics) Nausea And Vomiting and Nausea Only    Other     Metronidazole Nausea And Vomiting       Changes to allergies: No    SPECIALTY MEDICATION ADHERENCE     Humira - patient reports he thinks he has 1 left (more than expected - expect 0, but patient not at home to confirm)  Medication Adherence    Patient reported X missed doses in the last month: 0  Specialty Medication: Humira          Specialty medication(s) dose(s) confirmed: Regimen is correct and unchanged.     Are there any concerns with adherence? No    Adherence counseling provided? Not needed    CLINICAL MANAGEMENT AND INTERVENTION      Clinical Benefit Assessment:    Do you feel the medicine is effective or helping your condition? Yes    Clinical Benefit counseling provided? Not needed    Adverse Effects Assessment:    Are you experiencing any side effects? No    Are you experiencing difficulty administering your medicine? No    Quality of Life Assessment:    Quality of Life    Rheumatology  Oncology  Dermatology  1. What impact has your specialty medication had on the symptoms of your skin condition (i.e. itchiness, soreness, stinging)?: Tremendous  2. What impact has your specialty medication had on your comfort level with your skin?: Tremendous  Cystic Fibrosis          How many days over the past month did your HS  keep  you from your normal activities? For example, brushing your teeth or getting up in the morning. 0    Have you discussed this with your provider? Not needed    Acute Infection Status:    Acute infections noted within Epic:  No active infections  Patient reported infection: None    Therapy Appropriateness:    Is therapy appropriate based on current medication list, adverse reactions, adherence, clinical benefit and progress toward achieving therapeutic goals? Yes, therapy is appropriate and should be continued     DISEASE/MEDICATION-SPECIFIC INFORMATION      For patients on injectable medications: Patient currently has 1 doses left.  Next injection is scheduled for Thurs, 1/16 - patient thinks he has 1, will go ahead and send in case this is not correct since this is more than expected .    Chronic Inflammatory Diseases: Have you experienced any flares in the last month? No  Has this been reported to your provider? No    PATIENT SPECIFIC NEEDS     Does the patient have any physical, cognitive, or cultural barriers? No    Is the patient high risk? No    Did the patient require a clinical intervention? No    Does the patient require physician intervention or other additional services (i.e., nutrition, smoking cessation, social work)? No    SOCIAL DETERMINANTS OF HEALTH     At the [facility], we have learned that life circumstances - like trouble affording food, housing, utilities, or transportation can affect the health of many of our patients.   That is why we wanted to ask: are you currently experiencing any life circumstances that are negatively impacting your health and/or quality of life? Patient declined to answer    Social Drivers of Health     Food Insecurity: No Food Insecurity (07/02/2022)    Hunger Vital Sign     Worried About Running Out of Food in the Last Year: Never true     Ran Out of Food in the Last Year: Never true   Internet Connectivity: Not on file   Housing/Utilities: Low Risk  (07/02/2022)    Housing/Utilities     Within the past 12 months, have you ever stayed: outside, in a car, in a tent, in an overnight shelter, or temporarily in someone else's home (i.e. couch-surfing)?: No     Are you worried about losing your housing?: No     Within the past 12 months, have you been unable to get utilities (heat, electricity) when it was really needed?: No   Tobacco Use: Low Risk  (05/26/2023)    Received from [facility]    Patient History     Smoking Tobacco Use: Never     Smokeless Tobacco Use: Never     Passive Exposure: Not on file   Transportation Needs: No Transportation Needs (07/02/2022)    PRAPARE - Transportation     Lack of Transportation (Medical): No     Lack of Transportation (Non-Medical): No   Alcohol Use: Not At Risk (11/26/2022)    Alcohol Use     How often do you have a drink containing alcohol?: Never     How many drinks containing alcohol do you have on a typical day when you are drinking?: Not on file     How often do you have 5 or more drinks on one occasion?: Never   Interpersonal Safety: Not on file   Physical Activity: Not on file   Intimate Partner Violence:  Unknown (09/13/2021)    Received from [facility]    HITS     Physically Hurt: Not on file     Insult or Talk Down To: Not on file     Threaten Physical Harm: Not on file     Scream or Curse: Not on file   Stress: Not on file   Substance Use: Low Risk  (11/26/2022)    Substance Use     In the past year, how often have you used prescription drugs for non-medical reasons?: Never     In the past year, how often have you used illegal drugs?: Never     In the past year, have you used any substance for non-medical reasons?: No   Social Connections: Unknown (10/09/2021)    Received from [facility]    Social Network     Social Network: Not on file   Financial Resource Strain: Low Risk  (07/02/2022)    Overall Financial Resource Strain (CARDIA)     Difficulty of Paying Living Expenses: Not hard at all   Depression: Not at risk (03/05/2023)    PHQ-2     PHQ-2 Score: 1   Health Literacy: Not on file       Would you be willing to receive help with any of the needs that you have identified today? Not applicable       SHIPPING     Specialty Medication(s) to be Shipped:   Inflammatory Disorders: Humira    Other medication(s) to be shipped: No additional medications requested for fill at this time     Changes to insurance: No    Delivery Scheduled: Yes, Expected medication delivery date: 1/14.     Medication will be delivered via Same Day Courier to the confirmed prescription address in [facility].    The patient will receive a drug information handout for each medication shipped and additional FDA Medication Guides as required.  Verified that patient has previously received a Conservation officer, historic buildings and a Surveyor, mining.    The patient or caregiver noted above participated in the development of this care plan and knows that they can request review of or adjustments to the care plan at any time.      All of the patient's questions and concerns have been addressed.    Briseyda [facility] Specialty Pharmacist

## 2023-06-24 MED FILL — HUMIRA(CF) PEN 80 MG/0.8 ML SUBCUTANEOUS KIT: 28 days supply | Qty: 4 | Fill #11

## 2023-06-25 ENCOUNTER — Ambulatory Visit: Payer: 59 | Admitting: Podiatry

## 2023-06-25 ENCOUNTER — Ambulatory Visit: Admit: 2023-06-25 | Discharge: 2023-06-26 | Payer: MEDICARE | Attending: Family | Primary: Family

## 2023-06-25 DIAGNOSIS — H9202 Otalgia, left ear: Principal | ICD-10-CM

## 2023-06-25 DIAGNOSIS — J029 Acute pharyngitis, unspecified: Principal | ICD-10-CM

## 2023-06-25 IMAGING — CR DG CHEST 2V
3 series · 3 of 3 positions shown · non-contrast
Comparison: None.

CLINICAL DATA: Cough, hemoptysis

EXAM:
CHEST - 2 VIEW

[chest pa (1 of 2)]
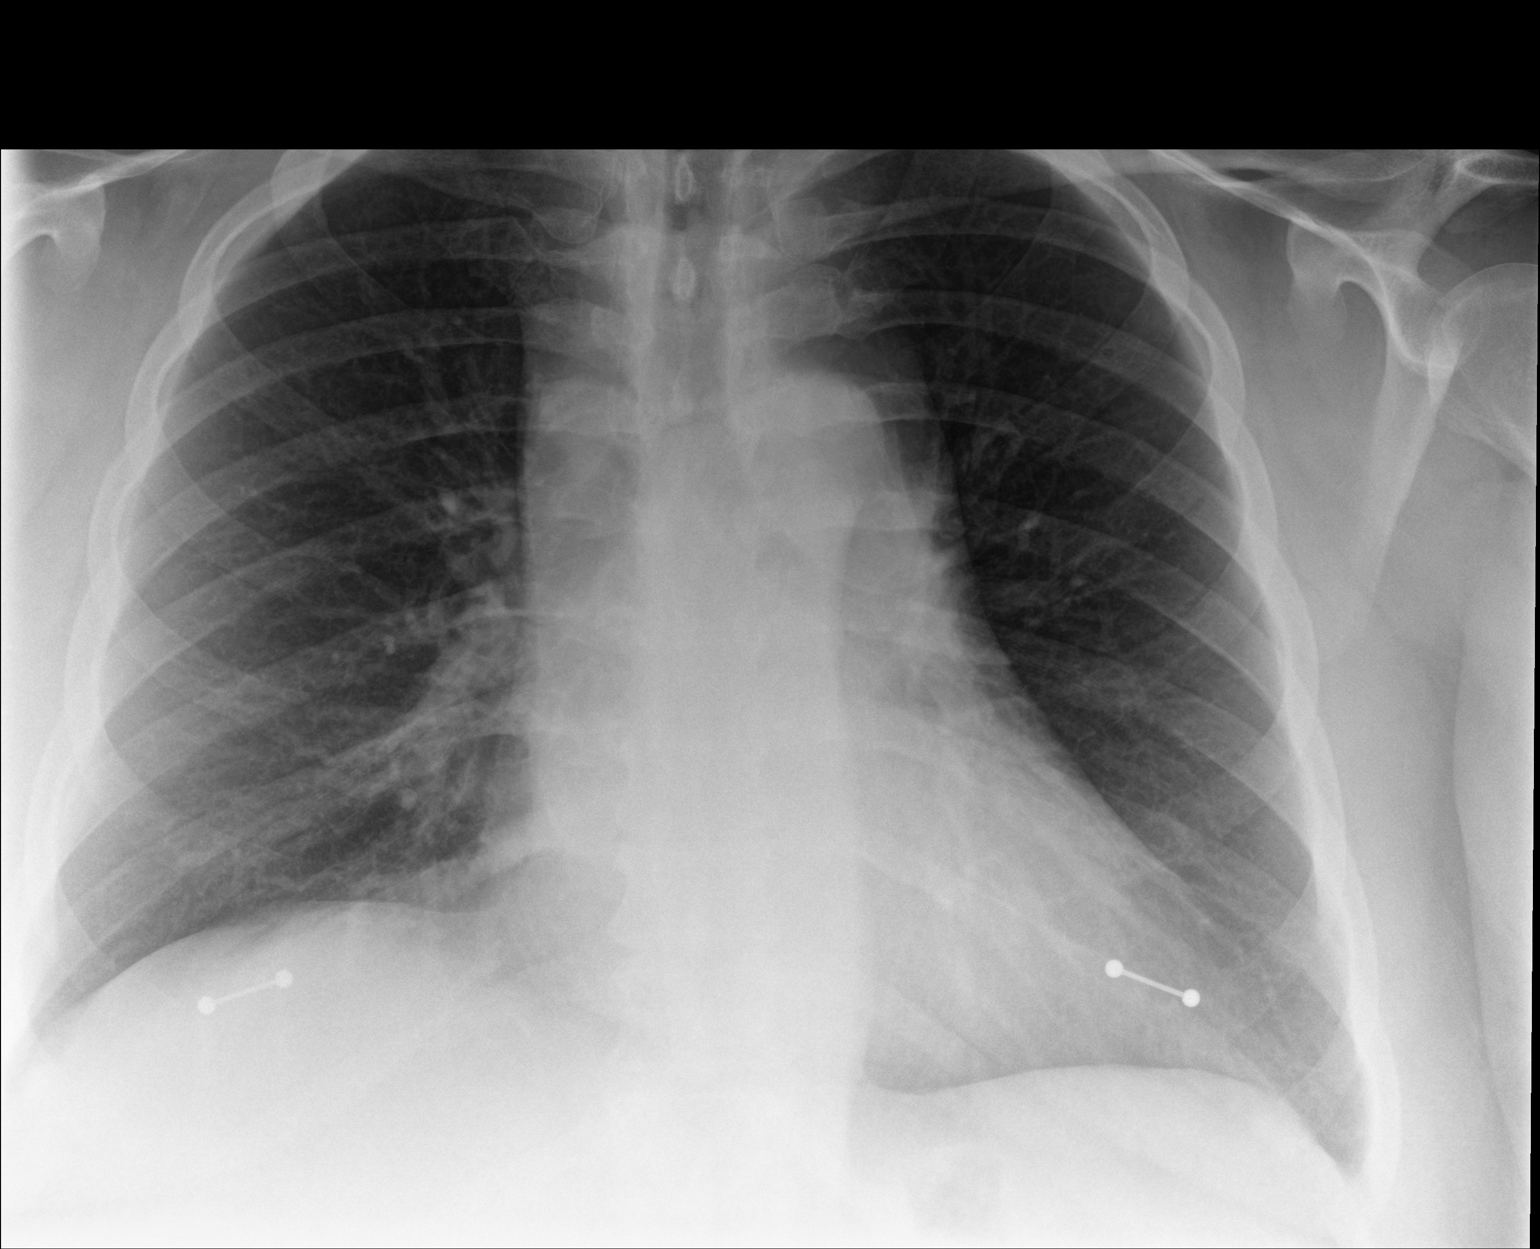

[chest lat]
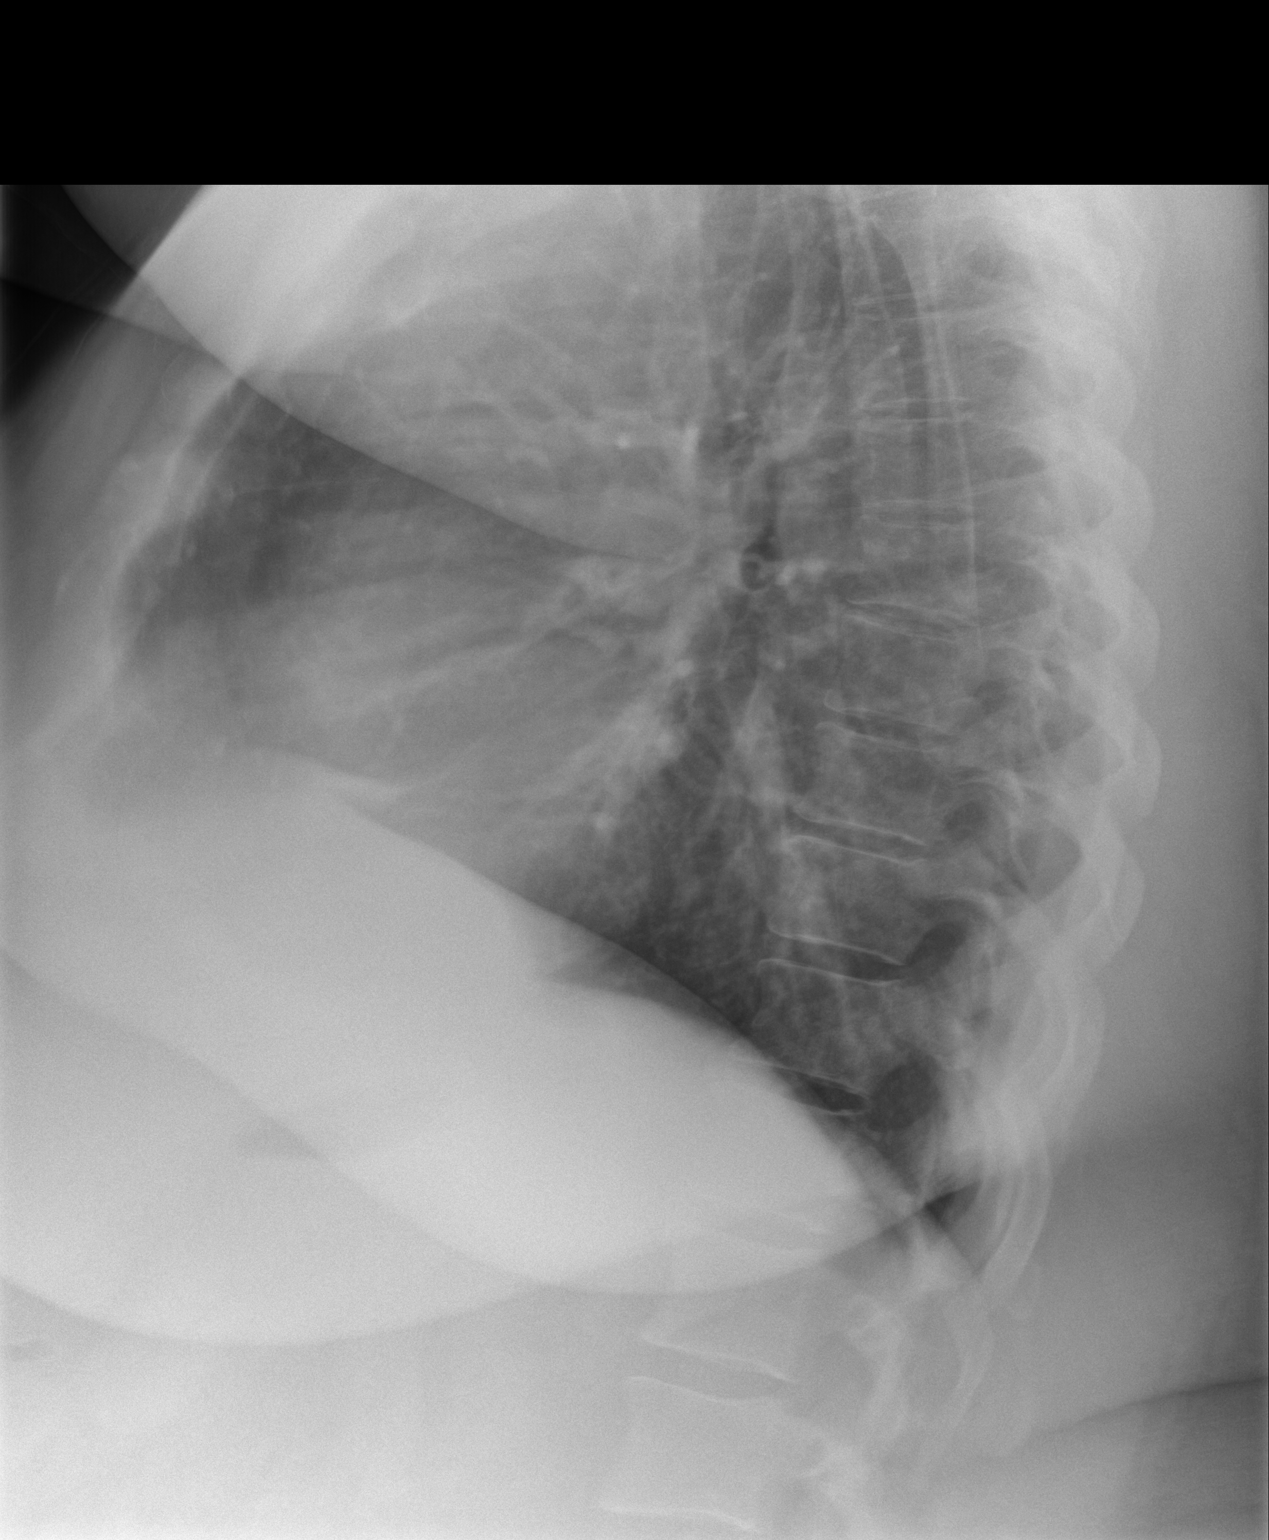

[chest pa (2 of 2)]
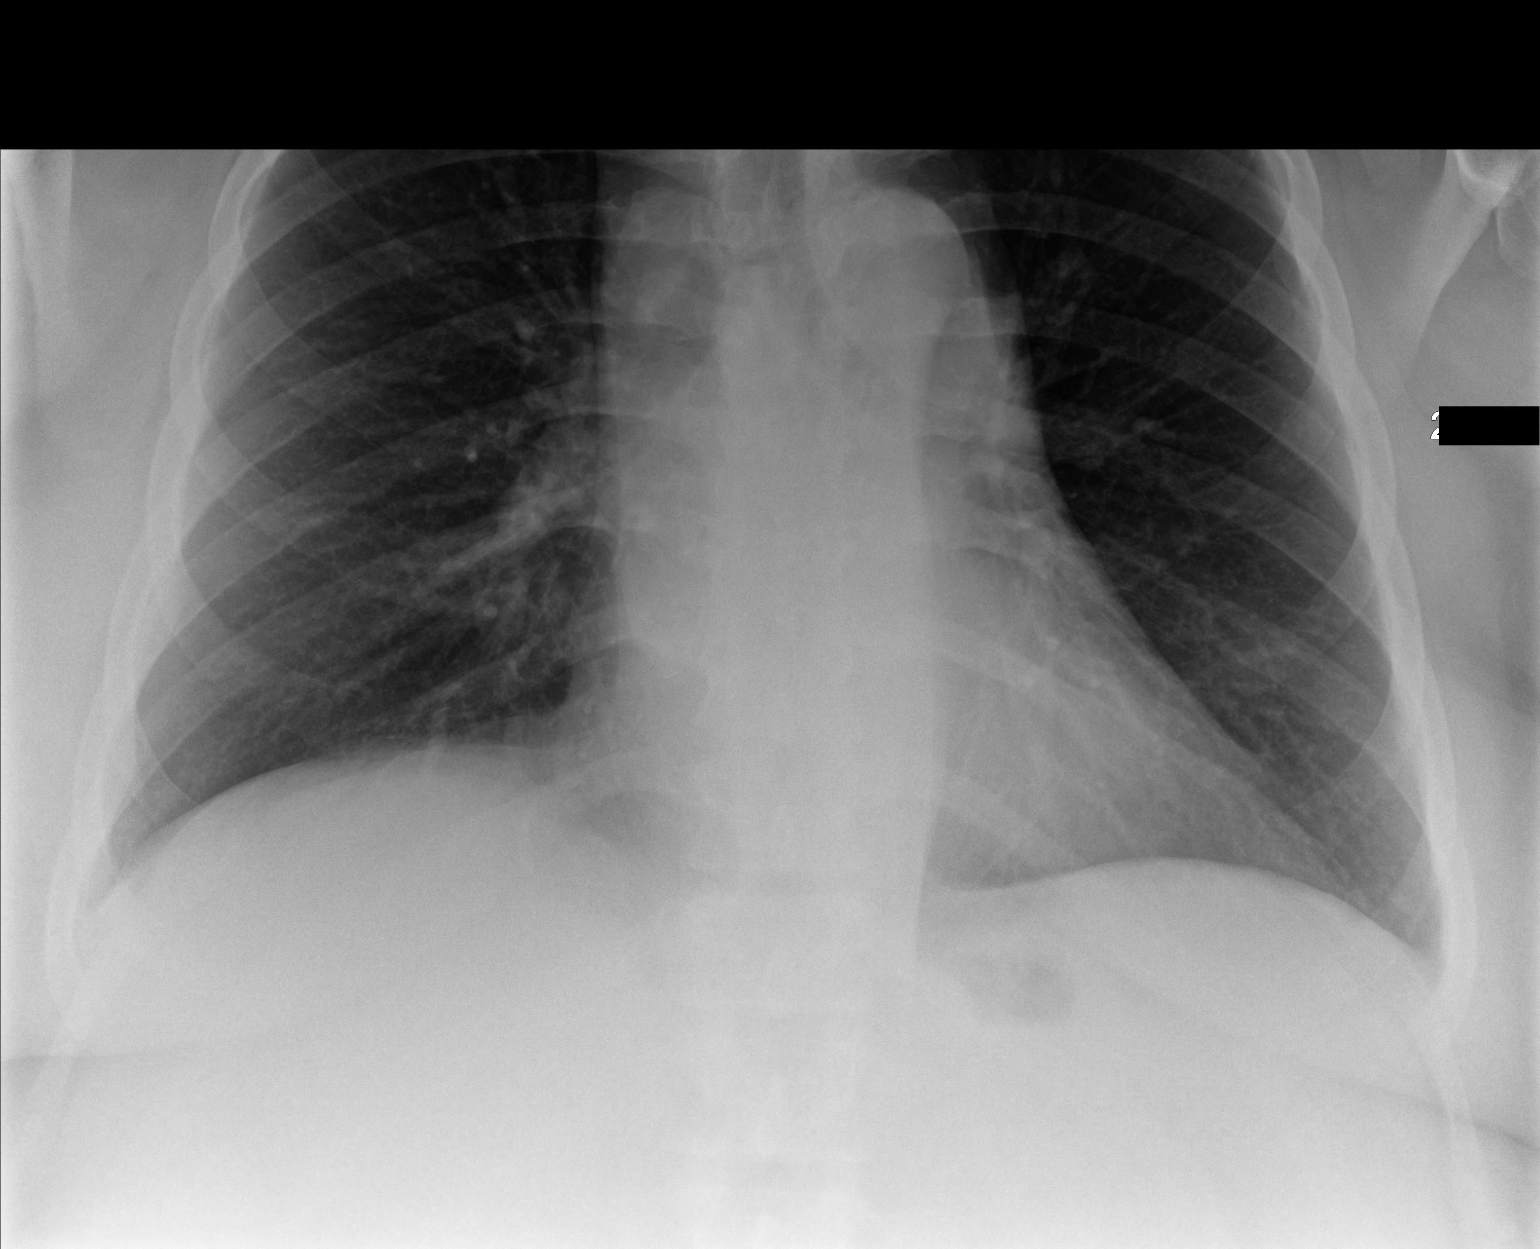

[3 of 3 positions shown; findings below may reference images not displayed]

FINDINGS: The heart size and mediastinal contours are within normal limits.
Both lungs are clear. The visualized skeletal structures are
unremarkable.
IMPRESSION: No active cardiopulmonary disease.

## 2023-06-25 MED ORDER — AZITHROMYCIN 250 MG TABLET
ORAL_TABLET | 0 refills | 0.00 days | Status: CP
Start: 2023-06-25 — End: ?

## 2023-06-25 NOTE — Unmapped (Signed)
Pharmacy called regarding patient's azithromycin. They are concerned because patient has Erythromycin listed on his allergy list. Please advise pharmacy.     Amyers

## 2023-06-25 NOTE — Unmapped (Signed)
Logan Quinn at [employer] he has done fine with it in the past.  He and [name] discussed at today's visit.

## 2023-06-25 NOTE — Unmapped (Signed)
Assessment/Plan:      [name] was seen today for sore throat and ear pain.    Diagnoses and all orders for this visit:    Left ear pain  -     azithromycin (ZITHROMAX) 250 MG tablet; 2 tabs by mouth today, then 1 tab daily for 4 days    Acute pharyngitis, unspecified etiology  -     azithromycin (ZITHROMAX) 250 MG tablet; 2 tabs by mouth today, then 1 tab daily for 4 days    URI likely viral. Will cover for secondary bacterial infection as patient higher risk with DM, asthma, obesity. Counseled on medication instructions and potential side effects. Recommend rest, force fluids, tylenol or ibuprofen prn pain. Reviewed urgent and return precautions.     Advised Tylenol #3 is not appropriate for plantar fasciitis however will provide a limited amount of medicatio (#15, no refills). Encouraged to follow up with podiatry.     Diagnosis and plan along with any newly prescribed medication(s) were discussed in detail with this patient today.  The patient verbalized understanding and agreed with the plan without language barriers or behavioral barriers to understanding unless otherwise noted.    Subjective:      HPI: Marckus Hanover. is a 43 y.o. male is here for    Chief Complaint   Patient presents with    Sore Throat     Sore throat x 2 days.    Ear Pain     LT ear pain x 3 days.         Patient presents with a 3 day history of congestion involving ear and throat.  Nasal drainage is minimal.  There isn't any facial pressure.  Cough is not present.  Symptoms are generally worse inconsistently.  For relief, he has tried tylenol and ibuprofen. Slight alleviation of symptoms.     Wears CPAP nightly. Often wakes with dry throat which causes cough.     Recent travel: none   His history is notable for asthma and diabetes.  Sick contacts: family members (son had tonsillitis a month ago, no current symptoms)      Seeing podiatry for nailcare/tx. Specialist is also managing plantar fasciitis. Has PF brace.  Patient requesting tylenol #3 for pain, reports this has helped in the past.         Answers submitted by the patient for this visit:  Sore Throat Questionnaire (Submitted on 06/23/2023)  Chief Complaint: Sore throat  Chronicity: new  Onset: today  Progression since onset: gradually worsening  Pain worse on: left  Fever: no fever  pain severity now: mild  Pain - numeric: 3/10  abdominal pain: No  congestion: No  cough: No  diarrhea: No  drooling: No  ear discharge: No  ear pain: Yes  headaches: No  hoarse voice: No  neck pain: No  plugged ear sensation: No  stridor: No  shortness of breath: No  swollen glands: Yes  trouble swallowing: No  vomiting: No         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)     Obstructive sleep apnea     OSA on CPAP     Retinal detachment 04/2014    Severe obstructive sleep apnea     Trapezius muscle strain 12/07/2013    Urinary incontinence, nocturnal enuresis     Venous insufficiency      Past Surgical History:   Procedure Laterality Date    EYE SURGERY  04/2014    LASER ABLATION INCOMPETENT VEIN INI [facility] HISTORICAL RESULT) Right     PR COLONOSCOPY FLX DX W/COLLJ SPEC WHEN PFRMD N/A 03/24/2013    Procedure: COLONOSCOPY, FLEXIBLE, PROXIMAL TO SPLENIC FLEXURE; DIAGNOSTIC, W/WO COLLECTION SPECIMEN BY BRUSH OR WASH;  Surgeon: Clint Bolder, MD;  Location: [facility];  Service: Gastroenterology    PR EYE SURG POST SGMT PROC UNLISTED Left     pneumatic retinopexy OS    PR UPPER GI ENDOSCOPY,DIAGNOSIS N/A 02/02/2013    Procedure: UGI ENDO, INCLUDE ESOPHAGUS, STOMACH, & DUODENUM &/OR JEJUNUM; DX W/WO COLLECTION SPECIMN, BY BRUSH OR WASH;  Surgeon: Malcolm Metro, MD;  Location: [facility];  Service: Gastroenterology    SKIN BIOPSY      [place] PYLORIC STENOSIS ([facility] HISTORICAL RESULT)         Family History:     Family History   Problem Relation Age of Onset    Cancer Maternal Grandfather         Stomach Cancer    Hearing loss Maternal Grandfather     Cancer Paternal Grandfather         Bone Cancer, Lung Cancer    COPD Paternal Grandmother     Arthritis Paternal Grandmother     Depression Paternal Grandmother     Diabetes Mother     Heart disease Mother     Migraines Mother     Arthritis Mother     Depression Mother     GER disease Mother     Hypertension Mother     Angina Mother     COPD Mother     Glaucoma Mother     Hearing loss Mother     Cataracts Mother     Diabetes Sister     Migraines Sister     Asthma Sister     Depression Sister     Hearing loss Sister     Diabetes Brother     Asthma Brother     Diabetes Maternal Grandmother     Heart disease Maternal Grandmother     Migraines Maternal Grandmother     Depression Maternal Grandmother     Angina Maternal Grandmother     Hypertension Maternal Grandmother     Stroke Maternal Grandmother     Diabetes Maternal Uncle     Hearing loss Maternal Uncle     Diabetes Maternal Uncle         resulted in need for kidney transplant    Liver disease Maternal Uncle     Kidney disease Maternal Uncle         needed kidney transplant    Asthma Brother     No Known Problems Father     No Known Problems Paternal Aunt     No  Known Problems Paternal Uncle     No Known Problems Other     Colorectal Cancer Neg Hx     Esophageal cancer Neg Hx     Liver cancer Neg Hx     Pancreatic cancer Neg Hx     Stomach cancer Neg Hx     Amblyopia Neg Hx     Blindness Neg Hx     Retinal detachment Neg Hx     Strabismus Neg Hx     Macular degeneration Neg Hx Anesthesia problems Neg Hx     Broken bones Neg Hx     Clotting disorder Neg Hx     Collagen disease Neg Hx     Dislocations Neg Hx     Fibromyalgia Neg Hx     Gout Neg Hx     Hemophilia Neg Hx     Osteoporosis Neg Hx     Rheumatologic disease Neg Hx     Scoliosis Neg Hx     Severe sprains Neg Hx     Sickle cell anemia Neg Hx     Spinal Compression Fracture Neg Hx     Melanoma Neg Hx     Basal cell carcinoma Neg Hx     Squamous cell carcinoma Neg Hx     Deep vein thrombosis Neg Hx     Thyroid disease Neg Hx        Social History:     Social History     Socioeconomic History    Marital status: Married     Spouse name: None    Number of children: None    Years of education: None    Highest education level: None   Tobacco Use    Smoking status: Never     Passive exposure: Never    Smokeless tobacco: Never   Vaping Use    Vaping status: Never Used   Substance and Sexual Activity    Alcohol use: Never     Comment: rare social    Drug use: Never    Sexual activity: Yes     Partners: Female     Birth control/protection: None   Other Topics Concern    Do you use sunscreen? Yes    Tanning bed use? No    Are you easily burned? No    Excessive sun exposure? No    Blistering sunburns? No     Social Drivers of Psychologist, prison and probation services Strain: Low Risk  (07/02/2022)    Overall Financial Resource Strain (CARDIA)     Difficulty of Paying Living Expenses: Not hard at all   Food Insecurity: No Food Insecurity (06/25/2023)    Hunger Vital Sign     Worried About Running Out of Food in the Last Year: Never true     Ran Out of Food in the Last Year: Never true   Transportation Needs: No Transportation Needs (06/25/2023)    PRAPARE - Therapist, art (Medical): No     Lack of Transportation (Non-Medical): No    Received from [facility]    Social Network       Allergies:     Erythromycin, Penicillins, Sulfa (sulfonamide antibiotics), Other, and Metronidazole    Current Medications:     Current Outpatient Medications   Medication Sig Dispense Refill    adalimumab (HUMIRA,CF, PEN) 80 mg/0.8 mL PnKt Inject 80 mg subcutaneously once every week 4 each 11    allopurinol (  ZYLOPRIM) 100 MG tablet Take 2 tablets (200 mg total) by mouth daily. 180 tablet 1    ALPRAZolam (XANAX) 0.5 MG tablet Takes 1 tablet nightly and 1 tablet a day when needed      anastrozole (ARIMIDEX) 1 mg tablet Take 1 tablet (1 mg total) by mouth daily. 30 tablet 1    ascorbic acid (VITAMIN C ORAL) Take 1 capsule by mouth nightly.      atorvastatin (LIPITOR) 20 MG tablet Take 1 tablet (20 mg total) by mouth daily. 90 tablet 3    blood sugar diagnostic (ACCU-CHEK GUIDE TEST STRIPS) Strp USE TO CHECK BLOOD SUGAR 3 TIMES DAILY BEFORE MEALS      blood-glucose meter kit Use as directed 1 each 0    clindamycin (CLEOCIN T) 1 % lotion Apply topically two (2) times a day. To affected areas on body as needed for flares 60 mL 3    clobetasoL (TEMOVATE) 0.05 % ointment Apply daily to painful affected areas if needed for flares, then stop 15 g 1    cyclobenzaprine (FLEXERIL) 10 MG tablet Take 1 tablet (10 mg total) by mouth Three (3) times a day as needed for muscle spasms. 60 tablet 1    dextroamphetamine sulfate (DEXTROSTAT) 10 MG tablet Take 2 tablets (20 mg total) by mouth two (2) times a day.      dicyclomine (BENTYL) 10 mg capsule Take 1 capsule (10 mg total) by mouth Four (4) times a day (before meals and nightly). 120 capsule 1    diphenoxylate-atropine (LOMOTIL) 2.5-0.025 mg per tablet Take 1 tablet by mouth four (4) times a day as needed for diarrhea. 30 tablet 1    DULoxetine (CYMBALTA) 60 MG capsule Take 1 capsule (60 mg total) by mouth Two (2) times a day. 180 capsule 0    empty container Misc Use as directed to dispose of needles. When full, make sure lid is closed tightly then dispose of container in trash. 1 each 2    erenumab-aooe (AIMOVIG AUTOINJECTOR) auto-injector Inject 1 Pen under the skin every thirty (30) days. 3 mL 1    famotidine (PEPCID) 20 MG tablet Take 1 tablet (20 mg total) by mouth two (2) times a day. 180 tablet 3    fluocinonide (LIDEX) 0.05 % gel Apply 1 Application topically Three (3) times a day.      fluoride, sodium, 1.1 % Pste       fluticasone propionate (FLONASE) 50 mcg/actuation nasal spray 1 spray into each nostril two (2) times a day. 16 g 5    glipiZIDE (GLUCOTROL) 10 MG tablet Take 2 tablets (20 mg total) by mouth Two (2) times a day (30 minutes before a meal). 360 tablet 3    HUMALOG U-100 INSULIN 100 unit/mL injection INJECT 120 UNITS UNDER THE SKIN DAILY VIA OMNIPOD 100 mL 3    hydrocortisone (PROCTOSOL HC) 2.5 % rectal cream Insert 1 Application into the rectum two (2) times a day as needed. 28.35 g 2    hydrOXYzine (ATARAX) 25 MG tablet Take 1 tablet (25 mg total) by mouth Three (3) times a day as needed. May take two before bed for sleep. 120 tablet 1    ibuprofen (ADVIL,MOTRIN) 800 MG tablet Take 1 tablet (800 mg total) by mouth every eight (8) hours as needed. 90 tablet 1    insulin glargine (LANTUS U-100 INSULIN) 100 unit/mL injection Inject 0.05 mL (5 Units total) under the skin nightly. 10 mL 0    insulin pump  cart,automated,BT (OMNIPOD 5 G6 PODS, GEN 5,) Crtg Change POD every 48 hours. 3 each 11    insulin syringe-needle U-100 (BD INSULIN SYRINGE ULTRA-FINE) 1 mL 31 gauge x 5/16 (8 mm) Syrg Once daily for insulin dosing. 100 each 1    insulin syringe-needle U-100 1 mL 31 gauge x 5/16 (8 mm) Syrg USE AS DIRECTED ONE TIME DAILY FOR INSULIN DOSING      ketoconazole (NIZORAL) 2 % cream Apply 1 Application topically daily. To affected area on body until clear 60 g 3    lancets (ACCU-CHEK FASTCLIX LANCET DRUM) Misc Use to check blood sugars 3 times a day before meals or as directed 200 each 5    levothyroxine (SYNTHROID) 50 MCG tablet Take 1 tablet (50 mcg total) by mouth daily. 90 tablet 1    LIDOCAINE 2 % solution Swish and spit three times a day as needed      losartan (COZAAR) 25 MG tablet Take 1 tablet (25 mg total) by mouth daily. 90 tablet 3    montelukast (SINGULAIR) 10 mg tablet Take 1 tablet (10 mg total) by mouth daily. 90 tablet 3    multivitamin (MULTIPLE VITAMIN ORAL) Take 1 tablet by mouth daily.      ondansetron (ZOFRAN) 4 MG tablet Take 1 tablet (4 mg total) by mouth every eight (8) hours as needed for nausea. 30 tablet 1    oxybutynin (DITROPAN) 5 MG tablet Take 1 tablet (5 mg total) by mouth two (2) times a day. 180 tablet 1    propranoloL (INDERAL LA) 120 mg 24 hr capsule Take 1 capsule (120 mg total) by mouth daily. 90 capsule 1    ramelteon (ROZEREM) 8 mg tablet Take 1 tablet (8 mg total) by mouth nightly.      safety needles (BD SAFETYGLIDE NEEDLE) 18 gauge x 1 1/2 Ndle USE ONE NEEDLE ONCE WEEKLY      safety needles (BD SAFETYGLIDE NEEDLE) 23 gauge x 1 Ndle 1 Units by Miscellaneous route once a week. 12 each 11    semaglutide (OZEMPIC) 1 mg/dose (4 mg/3 mL) PnIj injection Inject 1 mg under the skin every seven (7) days. 3 mL 2    sour cherry extract (TART CHERRY EXTRACT) 1,000 mg cap       syringe with needle (BD LUER-LOK SYRINGE) 3 mL 21 gauge x 1 1/2 Syrg To draw up testosterone once weekly 100 each 0    syringe with needle (BD LUER-LOK SYRINGE) 3 mL 23 gauge x 1 1/2 Syrg 1 each by Miscellaneous route once a week. To administer testosterone 50 each 0    tadalafil (CIALIS) 5 MG tablet Take 1 tablet (5 mg total) by mouth in the morning. 90 tablet 3    testosterone cypionate (DEPOTESTOTERONE CYPIONATE) 200 mg/mL injection Inject 0.5 mL (100 mg total) into the muscle once a week. Inject 0.5cc (100mg ) weekly 2 mL 5    topiramate (TOPAMAX) 50 MG tablet Take 1.5 tablets (75 mg total) by mouth two (2) times a day. 270 tablet 3    WELLBUTRIN XL 150 mg 24 hr tablet Take 1 tablet (150 mg total) by mouth every morning.      albuterol 2.5 mg /3 mL (0.083 %) nebulizer solution Inhale 3 mL (2.5 mg total) by nebulization every four (4) hours as needed for wheezing. (Patient not taking: Reported on 08/21/2022) 180 mL 2    albuterol HFA 90 mcg/actuation inhaler Inhale 2 puffs every six (6) hours as needed for wheezing. (Patient  not taking: Reported on 04/23/2023) 8.5 g 5    budesonide-formoterol (SYMBICORT) 80-4.5 mcg/actuation inhaler Inhale 2 puffs two (2) times a day. (Patient not taking: Reported on 04/23/2023) 10.2 g 11    colchicine (MITIGARE) 0.6 mg cap capsule Take 1 capsule (0.6 mg total) by mouth daily. (Patient not taking: Reported on 04/23/2023) 90 capsule 0    fluconazole (DIFLUCAN) 150 MG tablet Take 1 tablet (150 mg total) by mouth daily. Take 1 tablet then repeat 1 tablet in 5 days. (Patient not taking: Reported on 06/25/2023) 2 tablet 0    lidocaine (XYLOCAINE) 5 % ointment Apply topically Three (3) times a day as needed. (Patient not taking: Reported on 06/25/2023) 30 g 1    polyethylene glycol (GLYCOLAX) 17 gram/dose powder  (Patient not taking: Reported on 06/25/2023)      safety needles 18 gauge x 1 1/2 Ndle 1 Units by Miscellaneous route once a week. 12 each 11     No current facility-administered medications for this visit.       ROS:     Pertinent items are noted in HPI.  The following portions of the patient's history were reviewed and updated as appropriate: allergies, current medications, past medical history, past social history and problem list.    Vital Signs:     Wt Readings from Last 3 Encounters:   06/25/23 (!) 212.3 kg (468 lb)   04/23/23 (!) 206.8 kg (456 lb)   03/18/23 (!) 206.9 kg (456 lb 3.2 oz)     Temp Readings from Last 3 Encounters:   06/25/23 36.8 ??C (98.2 ??F) (Oral)   04/23/23 37 ??C (98.6 ??F) (Oral)   03/05/23 36.3 ??C (97.3 ??F)     BP Readings from Last 3 Encounters:   06/25/23 110/78   04/23/23 126/84   03/18/23 144/84     Pulse Readings from Last 3 Encounters:   06/25/23 89   04/23/23 98   03/18/23 69     Estimated body mass index is 65.27 kg/m?? as calculated from the following:    Height as of this encounter: 180.3 cm (5' 11).    Weight as of this encounter: 212.3 kg (468 lb).  Facility age limit for growth %iles is 20 years.         Objective:      General: Alert & Oriented X 3, NAD  EYES:  No scleral injection or discharge.  ENT: External auditory canals clear bilaterally. Right TM normal. Left TM intact, erythematous, dull, negative LR and LM.   No frontal or maxillary sinus tenderness.  Oropharynx moist without lesions.  Mild pharyngeal erythema, no exudate.    Lung: clear to auscultation bilaterally, respiratory effort unremarkable. No wheezes, rhonchi or rales.   Heart: regular rate and rhythm, S1, S2 normal, no murmur, click, rub or gallop  NECK: Normal size and contour. Thyroid smooth and symmetric without focal nodules.   LYMPH: No enlarged cervical or supraclavicular nodes palpated.      Labs:     No results found for this visit on 06/25/23.      [name], FNP

## 2023-06-26 MED ORDER — ACETAMINOPHEN 300 MG-CODEINE 30 MG TABLET
ORAL_TABLET | Freq: Three times a day (TID) | ORAL | 0 refills | 5.00 days | Status: CP | PRN
Start: 2023-06-26 — End: ?

## 2023-07-07 MED ORDER — CETIRIZINE 10 MG TABLET
ORAL_TABLET | Freq: Every day | ORAL | 3 refills | 90.00 days | Status: CP
Start: 2023-07-07 — End: 2024-07-06

## 2023-07-08 ENCOUNTER — Ambulatory Visit: Admit: 2023-07-08 | Discharge: 2023-07-09 | Payer: MEDICARE | Attending: Family | Primary: Family

## 2023-07-08 DIAGNOSIS — Z794 Long term (current) use of insulin: Principal | ICD-10-CM

## 2023-07-08 DIAGNOSIS — E1165 Type 2 diabetes mellitus with hyperglycemia: Principal | ICD-10-CM

## 2023-07-08 DIAGNOSIS — E1142 Type 2 diabetes mellitus with diabetic polyneuropathy: Principal | ICD-10-CM

## 2023-07-08 DIAGNOSIS — G4733 Obstructive sleep apnea (adult) (pediatric): Principal | ICD-10-CM

## 2023-07-08 DIAGNOSIS — E039 Hypothyroidism, unspecified: Principal | ICD-10-CM

## 2023-07-08 DIAGNOSIS — J410 Simple chronic bronchitis: Principal | ICD-10-CM

## 2023-07-08 DIAGNOSIS — M1A9XX0 Chronic gout, unspecified, without tophus (tophi): Principal | ICD-10-CM

## 2023-07-08 MED ORDER — ALLOPURINOL 300 MG TABLET
ORAL_TABLET | Freq: Every day | ORAL | 3 refills | 90.00 days | Status: CP
Start: 2023-07-08 — End: 2024-07-07

## 2023-07-08 NOTE — Unmapped (Signed)
Assessment and Plan:     Type 2 diabetes mellitus with hyperglycemia, with long-term current use of insulin (CMS-HCC)  HGB A1c 9.4 (up from 7.9 four months ago). DM poorly controlled. Continue Ozempic 1.0 mg weekly, Humalog up to 120 units daily via Omnipod, Lantus 5 units nightly, glipizide 20 mg BID. Encouraged patient to continue carb controlled diet and regular exercise.    - POCT glycosylated hemoglobin (Hb A1C)    Type 2 diabetes mellitus with diabetic polyneuropathy, with long-term current use of insulin (CMS-HCC)      Essential hypertension  BP near goal (142/84 in clinic today).   Continue losartan 25 mg daily. Continue atorvastatin 20 mg daily. Reviewed low sodium diet and encouraged regular exercise. Advised to continue to monitor and log at-home BP readings. Contact clinic if readings become elevated.    Acquired hypothyroidism  Last TSH 1.937 03/05/23. Continue levothyroxine 50 mcg daily.     Simple chronic bronchitis (CMS-HCC)  Stable. Continue daily maintenance with Symbicort and rescue inhaler, albuterol, as needed.     Morbid (severe) obesity due to excess calories (CMS-HCC)  Continue semaglutide 1 mg weekly, calorie restricted diet, and resume exercise as able.     Chronic gout involving toe of left foot without tophus, unspecified cause  Recent acute flare. Increase allopurinol to 300 mg daily.   - allopurinol (ZYLOPRIM) 300 MG tablet; Take 1 tablet (300 mg total) by mouth daily.    OSA on CPAP  Patient using CPAP as ordered and benefits from use.   Continue CPAP machine nightly.      I personally spent 40 minutes face-to-face and non-face-to-face in the care of this patient, which includes all pre, intra, and post visit time on the date of service.    Return in about 3 months (around 10/06/2023) for Next scheduled follow up.    HPI:      Logan Quinn. is here for   Chief Complaint   Patient presents with    Diabetes     He will schedule his diabetic eye exam.    Hypothyroidism Hypertension     Diabetes: Patient presents for follow up of diabetes.  A1C goal is <8.  Diabetes has customarily been at goal (complicated by: obesity).  Current symptoms include: hyperglycemia, paresthesia of the feet, polydipsia, and polyuria. Symptoms have progressed to a point and plateaued. Patient denies foot ulcerations, hypoglycemia , nausea, visual disturbances, and vomiting. Evaluation to date has included: hemoglobin A1C.  Home sugars: BGs are running  consistent with Hgb A1C. Using DexCom CGM. Current treatment: Continued glipizide, insulin, and semaglutide  which has been somewhat effective.  Doing regular exercise: no.    Patient reports he stays thirsty. Drinks Skinny syrup, no sugar/no carbs, very few calories. Adds to 4 of the 40 oz cups of water daily, 2 pumps of syrup in each (equals 2 tablespoons).     Insulin: Adjusts depending on Dexcom readings and BS values.   Patient can bolus self.     Hypertension: Patient presents for follow-up of hypertension. Blood pressure goal < 140/90.  Hypertension has customarily been at goal complicated by DM, obesity, OSA.  Home blood pressure readings: did not bring log. Patient reports SBP has been a little elevated due to foot pain. Salt intake and diet: salt not added to cooking. Associated signs and symptoms: peripheral edema. Patient denies: blurred vision, chest pain, dyspnea, headache, neck aches, orthopnea, palpitations, paroxysmal nocturnal dyspnea, pulsating in the ears, and tiredness/fatigue. Medication compliance:  taking as prescribed. He is not doing regular exercise.      Hypothyroid: Patient presents for follow-up of hypothyroidism. Current symptoms: diarrhea and weight changes . Patient denies change in energy level, heat / cold intolerance, nervousness, and palpitations. Symptoms have stabilized. He is taking medications on a regular basis. Current therapy includes: levothyroxine 50 mcg daily.    Patient injured his left foot coming down a ramp at his home. It was raining and he slipped at the end of the ramp. He reports his left foot went straight out to the side. Left foot in a CAM boot and has on knee brace. Less mobile since injury.   MRI upcoming with [facility].       Gout: Had a gout flare a few nights ago. Took a few doses of cholechicinefor x 2 days and symptoms resolved. Continues to take preventative treatment, allopurinol 200 mg daily.     OSA: wearing CPAP nightly. Average of 96% use for 2024.     Answers submitted by the patient for this visit:  Diabetes Questionnaire (Submitted on 07/03/2023)  Chief Complaint: Diabetes problem  Diabetes type: type 2  MedicAlert ID: [id number]  Disease duration: 10 Years  blurred vision: No  chest pain: No  fatigue: No  foot paresthesias: No  foot ulcerations: No  polydipsia: Yes  polyphagia: No  polyuria: Yes  visual change: No  weakness: No  weight loss: No  Symptom course: worsening  confusion: No  speech difficulty: No  dizziness: No  nervous/anxious: No  headaches: No  hunger: Yes  mood changes: No  pallor: No  seizures: No  tremors: No  sleepiness: No  sweats: No  blackouts: No  hospitalization: No  nocturnal hypoglycemia: No  required assistance: No  required glucagon: No  CVA: No  heart disease: No  impotence: Yes  nephropathy: No  peripheral neuropathy: No  PVD: No  retinopathy: No  CAD risks: family history, obesity  Current treatments: diet, insulin injections, insulin pump, oral agent (monotherapy)  Treatment compliance: most of the time  Dose schedule: at bedtime  Given by: patient, significant other  Injection sites: abdominal wall, arms, thighs  Home blood tests: 5+ x per day  Monitoring compliance: good  Blood glucose trend: increasing steadily  breakfast time: 8-9 am  breakfast glucose level: 180-200  lunch time: 12-1 pm  lunch glucose level: 180-200  dinner time: 7-8 pm  dinner glucose level: >200  Bedtime: 10-11 pm  Bedtime glucose level: 180-200  High score: >200  Overall: >200  Weight trend: decreasing steadily  Current diet: diabetic, low fat/cholesterol  Meal planning: calorie counting, carbohydrate counting  Exercise: weekly  Dietitian visit: No  Eye exam current: No  Sees podiatrist: Yes         ROS:      Comprehensive 10 point ROS negative unless otherwise stated in the HPI.      [facility] Components:     Medication adherence and barriers to the treatment plan have been addressed. Opportunities to optimize healthy behaviors have been discussed. Patient / caregiver voiced understanding.    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    Liver disease     Migraines     Mild intermittent asthma without complication 10/11/2021    Morbid obesity with BMI of 60.0-69.9, adult (CMS-HCC)     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)     Obstructive sleep apnea     OSA on CPAP     Retinal detachment 04/2014    Severe obstructive sleep apnea     Trapezius muscle strain 12/07/2013    Urinary incontinence, nocturnal enuresis     Venous insufficiency      Past Surgical History:   Procedure Laterality Date    COLONOSCOPY      CYSTOSCOPY      EYE SURGERY  04/2014    LASER ABLATION INCOMPETENT VEIN INI [facility] HISTORICAL RESULT) Right     PR COLONOSCOPY FLX DX W/COLLJ SPEC WHEN PFRMD N/A 03/24/2013    Procedure: COLONOSCOPY, FLEXIBLE, PROXIMAL TO SPLENIC FLEXURE; DIAGNOSTIC, W/WO COLLECTION SPECIMEN BY BRUSH OR WASH;  Surgeon: Clint Bolder, MD;  [facility];  Service: Gastroenterology    PR EYE SURG POST SGMT PROC UNLISTED Left     pneumatic retinopexy OS    PR UPPER GI ENDOSCOPY,DIAGNOSIS N/A 02/02/2013    Procedure: UGI ENDO, INCLUDE ESOPHAGUS, STOMACH, & DUODENUM &/OR JEJUNUM; DX W/WO COLLECTION SPECIMN, BY BRUSH OR WASH;  Surgeon: Malcolm Metro, MD;  [facility];  Service: Gastroenterology    SKIN BIOPSY      UPPER GASTROINTESTINAL ENDOSCOPY      [place] PYLORIC STENOSIS ([place] HISTORICAL RESULT)         Family History:     Family History   Problem Relation Age of Onset    Cancer Maternal Grandfather         Stomach Cancer    Hearing loss Maternal Grandfather     GER disease Maternal Grandfather     Cancer Paternal Grandfather         Bone Cancer, Lung Cancer    COPD Paternal Grandmother     Arthritis Paternal Grandmother     Depression Paternal Grandmother     Osteoporosis Paternal Grandmother     Diabetes Mother     Heart disease Mother     Migraines Mother     Arthritis Mother     Depression Mother     GER disease Mother     Hypertension Mother     Angina Mother     COPD Mother     Glaucoma Mother     Hearing loss Mother     Cataracts Mother     Stroke Mother     Miscarriages / [place] Mother     Diabetes Sister     Migraines Sister     Asthma Sister     Depression Sister     Hearing loss Sister     Diabetes Brother     Asthma Brother     Diabetes Maternal Grandmother     Heart disease Maternal Grandmother     Migraines Maternal Grandmother     Depression Maternal Grandmother     Angina Maternal Grandmother     Hypertension Maternal Grandmother     Stroke Maternal Grandmother     GER disease Maternal Grandmother     Diabetes Maternal Uncle     Hearing loss Maternal Uncle  Diabetes Maternal Uncle         resulted in need for kidney transplant    Liver disease Maternal Uncle     Kidney disease Maternal Uncle         needed kidney transplant    Depression Maternal Uncle     Asthma Brother     Gout Father     No Known Problems Paternal Aunt     No Known Problems Paternal Uncle     No Known Problems Other     Diabetes Sister     Asthma Sister     Depression Sister     Hearing loss Sister     Migraines Sister     Diabetes Brother     Asthma Brother Broken bones Brother     Diabetes Maternal Uncle     Hearing loss Maternal Uncle     Asthma Brother     Colorectal Cancer Neg Hx     Esophageal cancer Neg Hx     Liver cancer Neg Hx     Pancreatic cancer Neg Hx     Stomach cancer Neg Hx     Amblyopia Neg Hx     Blindness Neg Hx     Retinal detachment Neg Hx     Strabismus Neg Hx     Macular degeneration Neg Hx     Anesthesia problems Neg Hx     Clotting disorder Neg Hx     Collagen disease Neg Hx     Dislocations Neg Hx     Fibromyalgia Neg Hx     Hemophilia Neg Hx     Rheumatologic disease Neg Hx     Scoliosis Neg Hx     Severe sprains Neg Hx     Sickle cell anemia Neg Hx     Spinal Compression Fracture Neg Hx     Melanoma Neg Hx     Basal cell carcinoma Neg Hx     Squamous cell carcinoma Neg Hx     Deep vein thrombosis Neg Hx     Thyroid disease Neg Hx        Social History:     Social History     Tobacco Use    Smoking status: Never     Passive exposure: Never    Smokeless tobacco: Never   Vaping Use    Vaping status: Never Used   Substance Use Topics    Alcohol use: Never     Comment: rare social    Drug use: Never       Allergies:     Erythromycin, Penicillins, Sulfa (sulfonamide antibiotics), Other, and Metronidazole    Current Medications:     Current Outpatient Medications   Medication Sig Dispense Refill    acetaminophen-codeine (TYLENOL #3) 300-30 mg per tablet Take 1 tablet by mouth Three (3) times a day as needed for pain, moderate pain or severe pain. 15 tablet 0    adalimumab (HUMIRA,CF, PEN) 80 mg/0.8 mL PnKt Inject 80 mg subcutaneously once every week 4 each 11    allopurinol (ZYLOPRIM) 100 MG tablet Take 2 tablets (200 mg total) by mouth daily. 180 tablet 1    ALPRAZolam (XANAX) 0.5 MG tablet Takes 1 tablet nightly and 1 tablet a day when needed      anastrozole (ARIMIDEX) 1 mg tablet Take 1 tablet (1 mg total) by mouth daily. 30 tablet 1    ascorbic acid (VITAMIN C ORAL) Take 1 capsule by mouth nightly.  atorvastatin (LIPITOR) 20 MG tablet Take 1 tablet (20 mg total) by mouth daily. 90 tablet 3    blood sugar diagnostic (ACCU-CHEK GUIDE TEST STRIPS) Strp USE TO CHECK BLOOD SUGAR 3 TIMES DAILY BEFORE MEALS      blood-glucose meter kit Use as directed 1 each 0    cetirizine (ALLERGY RELIEF, CETIRIZINE,) 10 MG tablet Take 1 tablet (10 mg total) by mouth daily. 90 tablet 3    clindamycin (CLEOCIN T) 1 % lotion Apply topically two (2) times a day. To affected areas on body as needed for flares 60 mL 3    clobetasoL (TEMOVATE) 0.05 % ointment Apply daily to painful affected areas if needed for flares, then stop 15 g 1    colchicine (MITIGARE) 0.6 mg cap capsule Take 1 capsule (0.6 mg total) by mouth daily. 90 capsule 0    cyclobenzaprine (FLEXERIL) 10 MG tablet Take 1 tablet (10 mg total) by mouth Three (3) times a day as needed for muscle spasms. 60 tablet 1    dextroamphetamine sulfate (DEXTROSTAT) 10 MG tablet Take 2 tablets (20 mg total) by mouth two (2) times a day.      dicyclomine (BENTYL) 10 mg capsule Take 1 capsule (10 mg total) by mouth Four (4) times a day (before meals and nightly). 120 capsule 1    diphenoxylate-atropine (LOMOTIL) 2.5-0.025 mg per tablet Take 1 tablet by mouth four (4) times a day as needed for diarrhea. 30 tablet 1    DULoxetine (CYMBALTA) 60 MG capsule Take 1 capsule (60 mg total) by mouth Two (2) times a day. 180 capsule 0    empty container Misc Use as directed to dispose of needles. When full, make sure lid is closed tightly then dispose of container in trash. 1 each 2    erenumab-aooe (AIMOVIG AUTOINJECTOR) auto-injector Inject 1 Pen under the skin every thirty (30) days. 3 mL 1    famotidine (PEPCID) 20 MG tablet Take 1 tablet (20 mg total) by mouth two (2) times a day. 180 tablet 3    fluoride, sodium, 1.1 % Pste       fluticasone propionate (FLONASE) 50 mcg/actuation nasal spray 1 spray into each nostril two (2) times a day. 16 g 5    glipiZIDE (GLUCOTROL) 10 MG tablet Take 2 tablets (20 mg total) by mouth Two (2) times a day (30 minutes before a meal). 360 tablet 3    HUMALOG U-100 INSULIN 100 unit/mL injection INJECT 120 UNITS UNDER THE SKIN DAILY VIA OMNIPOD 100 mL 3    hydrOXYzine (ATARAX) 25 MG tablet Take 1 tablet (25 mg total) by mouth Three (3) times a day as needed. May take two before bed for sleep. 120 tablet 1    insulin glargine (LANTUS U-100 INSULIN) 100 unit/mL injection Inject 0.05 mL (5 Units total) under the skin nightly. 10 mL 0    insulin pump cart,automated,BT (OMNIPOD 5 G6 PODS, GEN 5,) Crtg Change POD every 48 hours. 3 each 11    insulin syringe-needle U-100 (BD INSULIN SYRINGE ULTRA-FINE) 1 mL 31 gauge x 5/16 (8 mm) Syrg Once daily for insulin dosing. 100 each 1    insulin syringe-needle U-100 1 mL 31 gauge x 5/16 (8 mm) Syrg USE AS DIRECTED ONE TIME DAILY FOR INSULIN DOSING      lancets (ACCU-CHEK FASTCLIX LANCET DRUM) Misc Use to check blood sugars 3 times a day before meals or as directed 200 each 5    levothyroxine (SYNTHROID) 50 MCG tablet  Take 1 tablet (50 mcg total) by mouth daily. 90 tablet 1    losartan (COZAAR) 25 MG tablet Take 1 tablet (25 mg total) by mouth daily. 90 tablet 3    montelukast (SINGULAIR) 10 mg tablet Take 1 tablet (10 mg total) by mouth daily. 90 tablet 3    multivitamin (MULTIPLE VITAMIN ORAL) Take 1 tablet by mouth daily.      ondansetron (ZOFRAN) 4 MG tablet Take 1 tablet (4 mg total) by mouth every eight (8) hours as needed for nausea. 30 tablet 1    oxybutynin (DITROPAN) 5 MG tablet Take 1 tablet (5 mg total) by mouth two (2) times a day. 180 tablet 1    propranoloL (INDERAL LA) 120 mg 24 hr capsule Take 1 capsule (120 mg total) by mouth daily. 90 capsule 1    ramelteon (ROZEREM) 8 mg tablet Take 1 tablet (8 mg total) by mouth nightly.      safety needles (BD SAFETYGLIDE NEEDLE) 18 gauge x 1 1/2 Ndle USE ONE NEEDLE ONCE WEEKLY      semaglutide (OZEMPIC) 1 mg/dose (4 mg/3 mL) PnIj injection Inject 1 mg under the skin every seven (7) days. 3 mL 2    sour cherry extract (TART CHERRY EXTRACT) 1,000 mg cap       syringe with needle (BD LUER-LOK SYRINGE) 3 mL 21 gauge x 1 1/2 Syrg To draw up testosterone once weekly 100 each 0    tadalafil (CIALIS) 5 MG tablet Take 1 tablet (5 mg total) by mouth in the morning. 90 tablet 3    testosterone cypionate (DEPOTESTOTERONE CYPIONATE) 200 mg/mL injection Inject 0.5 mL (100 mg total) into the muscle once a week. Inject 0.5cc (100mg ) weekly 2 mL 5    topiramate (TOPAMAX) 50 MG tablet Take 1.5 tablets (75 mg total) by mouth two (2) times a day. 270 tablet 3    traMADol (ULTRAM) 50 mg tablet Take 1 tablet (50 mg total) by mouth every eight (8) hours as needed.      WELLBUTRIN XL 150 mg 24 hr tablet Take 1 tablet (150 mg total) by mouth every morning.      albuterol 2.5 mg /3 mL (0.083 %) nebulizer solution Inhale 3 mL (2.5 mg total) by nebulization every four (4) hours as needed for wheezing. (Patient not taking: Reported on 08/21/2022) 180 mL 2    albuterol HFA 90 mcg/actuation inhaler Inhale 2 puffs every six (6) hours as needed for wheezing. (Patient not taking: Reported on 04/23/2023) 8.5 g 5    fluconazole (DIFLUCAN) 150 MG tablet Take 1 tablet (150 mg total) by mouth daily. Take 1 tablet then repeat 1 tablet in 5 days. (Patient not taking: Reported on 06/25/2023) 2 tablet 0    fluocinonide (LIDEX) 0.05 % gel Apply 1 Application topically Three (3) times a day. (Patient not taking: Reported on 07/08/2023)      hydrocortisone (PROCTOSOL HC) 2.5 % rectal cream Insert 1 Application into the rectum two (2) times a day as needed. (Patient not taking: Reported on 07/08/2023) 28.35 g 2    ibuprofen (ADVIL,MOTRIN) 800 MG tablet Take 1 tablet (800 mg total) by mouth every eight (8) hours as needed. (Patient not taking: Reported on 07/08/2023) 90 tablet 1    ketoconazole (NIZORAL) 2 % cream Apply 1 Application topically daily. To affected area on body until clear (Patient not taking: Reported on 07/08/2023) 60 g 3    lidocaine (XYLOCAINE) 5 % ointment Apply topically Three (3) times a  day as needed. (Patient not taking: Reported on 06/25/2023) 30 g 1    LIDOCAINE 2 % solution Swish and spit three times a day as needed (Patient not taking: Reported on 07/08/2023)      polyethylene glycol (GLYCOLAX) 17 gram/dose powder  (Patient not taking: Reported on 06/25/2023)       No current facility-administered medications for this visit.       Health Maintenance:     Health Maintenance   Topic Date Due    COVID-19 Vaccine (3 - Pfizer risk series) 01/24/2021    Retinal Eye Exam  10/31/2022    Hemoglobin A1c  06/04/2023    DTaP/Tdap/Td Vaccines (4 - Td or Tdap) 07/17/2023    Foot Exam  11/25/2023    Urine Albumin/Creatinine Ratio  11/26/2023    Serum Creatinine Monitoring  03/04/2024    Potassium Monitoring  03/04/2024    Pneumococcal Vaccine 0-49  Completed    Hepatitis C Screen  Completed    Influenza Vaccine  Completed       Immunizations:     Immunization History   Administered Date(s) Administered    COVID-19 VACC,MRNA,(PFIZER)(PF) 12/04/2020, 12/27/2020    HEPATITIS B VACCINE ADULT,IM(ENERGIX B, RECOMBIVAX) 03/05/2013, 04/05/2013, 09/03/2013    Hepatitis B Vaccine, Unspecified Formulation 12/27/1999, 04/29/2000    INFLUENZA INJ MDCK PF, QUAD,(FLUCELVAX)(3MO AND UP EGG FREE) 03/31/2019    INFLUENZA TIV (TRI) PF (IM)(HISTORICAL) 10/08/2011    INFLUENZA VACCINE IIV3(IM)(PF)6 MOS UP 03/05/2023    Influenza Vaccine Quad(IM)6 MO-Adult(PF) 03/05/2013, 03/18/2014, 03/22/2015, 03/05/2016, 03/19/2017, 02/23/2018, 02/19/2022    Influenza Virus Vaccine, unspecified formulation 03/19/2017, 02/23/2018, 03/25/2019, 03/10/2020, 04/05/2021    PNEUMOCOCCAL POLYSACCHARIDE 23-VALENT 12/30/2012    PPD Test 10/28/2016    Pneumococcal Conjugate 20-valent 01/02/2022    TD(TDVAX),ADSORBED,2LF(IM)(PF) 04/29/2000    TdaP 07/18/2008, 07/16/2013     I have reviewed and (if needed) updated the patient's problem list, medications, allergies, past medical and surgical history, social and family history. Vital Signs:     Wt Readings from Last 3 Encounters:   07/08/23 (!) 212.3 kg (468 lb)   06/25/23 (!) 212.3 kg (468 lb)   04/23/23 (!) 206.8 kg (456 lb)     Temp Readings from Last 3 Encounters:   07/08/23 36.9 ??C (98.4 ??F) (Oral)   06/25/23 36.8 ??C (98.2 ??F) (Oral)   04/23/23 37 ??C (98.6 ??F) (Oral)     BP Readings from Last 3 Encounters:   07/08/23 142/84   06/25/23 110/78   04/23/23 126/84     Pulse Readings from Last 3 Encounters:   07/08/23 96   06/25/23 89   04/23/23 98     Estimated body mass index is 65.27 kg/m?? as calculated from the following:    Height as of this encounter: 180.3 cm (5' 11).    Weight as of this encounter: 212.3 kg (468 lb).  Facility age limit for growth %iles is 20 years.      Objective:      General: Alert and oriented x3. Well-appearing. No acute distress.   HEENT:  Normocephalic.  Atraumatic. Conjunctiva and sclera normal. OP MMM without lesions.   Neck:  Supple. No thyroid enlargement. No adenopathy.   Heart:  Regular rate and rhythm. Normal S1, S2. No murmurs, rubs or gallops.   Lungs:  No respiratory distress.  Lungs clear to auscultation. No wheezes, rhonchi, or rales.   GI/GU:  Soft, obese, +BS, nondistended, non-TTP. No palpable masses or organomegaly.   Extremities:  No edema. Peripheral pulses  normal. Left foot in boot.   Skin:  Warm, dry. No rash or lesions present.   Neuro:  Non-focal. No obvious weakness.   Psych:  Affect normal, eye contact good, speech clear and coherent.           [name], FNP

## 2023-07-10 ENCOUNTER — Ambulatory Visit: Payer: Medicaid Other | Admitting: Podiatry

## 2023-07-10 DIAGNOSIS — Z91198 Patient's noncompliance with other medical treatment and regimen for other reason: Secondary | ICD-10-CM

## 2023-07-10 NOTE — Progress Notes (Signed)
1. Failure to attend appointment with reason given    Patient canceled appointment.

## 2023-07-14 ENCOUNTER — Ambulatory Visit (INDEPENDENT_AMBULATORY_CARE_PROVIDER_SITE_OTHER): Payer: 59 | Admitting: Podiatry

## 2023-07-14 ENCOUNTER — Encounter: Payer: Self-pay | Admitting: Podiatry

## 2023-07-14 DIAGNOSIS — E1142 Type 2 diabetes mellitus with diabetic polyneuropathy: Secondary | ICD-10-CM

## 2023-07-14 DIAGNOSIS — M79676 Pain in unspecified toe(s): Secondary | ICD-10-CM | POA: Diagnosis not present

## 2023-07-14 DIAGNOSIS — B351 Tinea unguium: Secondary | ICD-10-CM | POA: Diagnosis not present

## 2023-07-14 NOTE — Progress Notes (Signed)
  Subjective:  Patient ID: Jason Davidson, male    DOB: [birth year],  MRN: [id number]  Myer Haff presents to clinic today for at risk foot care with history of diabetic neuropathy and painful, elongated thickened toenails x 10 which are symptomatic when wearing enclosed shoe gear. This interferes with his/her daily activities. Patient is accompanied by his wife on today's visit. Apologizes for missing his last appointment as he got it confused with his Orthopedic appointment. Chief Complaint  Patient presents with   Diabetes    "She's just doing my toenails."   New problem(s): None.   PCP is [name], NP.  Allergies  Allergen Reactions   Erythromycin Nausea And Vomiting   Metronidazole Nausea And Vomiting   Penicillins Swelling   Sulfa Antibiotics Nausea And Vomiting    Review of Systems: Negative except as noted in the HPI.  Objective:  There were no vitals filed for this visit. Iman RONAV FURNEY is a pleasant 43 y.o. male morbidly obese in NAD. AAO x 3.  Vascular Examination: Capillary refill time <3 seconds b/l LE. Lymphedema b/l LE. Palpable pedal pulses b/l LE. Digital hair sparse b/l. Skin temperature gradient WNL b/l. No varicosities b/l.  Dermatological Examination: Pedal skin with normal turgor, texture and tone b/l. No open wounds. No interdigital macerations b/l. Toenails 1-5 b/l thickened, discolored, dystrophic with subungual debris. There is pain on palpation to dorsal aspect of nailplates.   Neurological Examination: Protective sensation intact with 10 gram monofilament b/l LE. Vibratory sensation intact b/l LE. Pt has subjective symptoms of neuropathy.   Musculoskeletal Examination: Normal muscle strength 5/5 to all lower extremity muscle groups bilaterally. No pain, crepitus or joint limitation noted with ROM b/l LE. No gross bony pedal deformities b/l. Patient ambulates independently without assistive aids.  Assessment/Plan: 1. Pain  due to onychomycosis of toenail   2. Diabetic polyneuropathy associated with type 2 diabetes mellitus (HCC)     -Patient was evaluated today. All questions/concerns addressed on today's visit. -Continue foot and shoe inspections daily. Monitor blood glucose per PCP/Endocrinologist's recommendations. -Continue supportive shoe gear daily. -Mycotic toenails 1-5 bilaterally were debrided in length and girth with sterile nail nippers and dremel without incident. -Patient/POA to call should there be question/concern in the interim.   Return in about 3 months (around 10/11/2023).  Freddie Breech, DPM      [place] LOCATION: [address], [place] [address]                   Office [phone]   [facility] LOCATION: [address] [place], [place] [address] Office [phone]

## 2023-07-17 DIAGNOSIS — I10 Essential (primary) hypertension: Principal | ICD-10-CM

## 2023-07-17 MED ORDER — LOSARTAN 50 MG TABLET
ORAL_TABLET | Freq: Every day | ORAL | 3 refills | 90 days | Status: CP
Start: 2023-07-17 — End: 2024-07-16

## 2023-07-17 NOTE — Unmapped (Signed)
While Logan Quinn was here as his wife's visit he discussed his elevated BP.  [name] told him to increase in Losartan dose to 50 mg daily.  He told her he had plenty at home.  He is not going to have a vehcle so wants RX sent.  Verbal orders from [name] to send in Losartan 50 mg daily.  [employer].  RX sent and patient aware.

## 2023-07-19 DIAGNOSIS — Z794 Long term (current) use of insulin: Principal | ICD-10-CM

## 2023-07-19 DIAGNOSIS — E1165 Type 2 diabetes mellitus with hyperglycemia: Principal | ICD-10-CM

## 2023-07-19 MED ORDER — LANTUS U-100 INSULIN 100 UNIT/ML SUBCUTANEOUS SOLUTION
Freq: Every evening | SUBCUTANEOUS | 2 refills | 120.00 days
Start: 2023-07-19 — End: 2024-07-17

## 2023-07-19 NOTE — Unmapped (Signed)
Patient is requesting the following refill  Requested Prescriptions     Pending Prescriptions Disp Refills    insulin glargine (LANTUS U-100 INSULIN) 100 unit/mL injection [Pharmacy Med Name: LANTUS 100 UNIT/ML VIAL] 6 mL 2     Sig: Inject 0.05 mL (5 Units total) under the skin nightly.       Recent Visits  Date Type Provider Dept   07/08/23 Office Visit Loran Senters, FNP [facility] [address] [facility]   06/25/23 Office Visit Loran Senters, FNP [place] [facility] [address] At [facility]   04/23/23 Office Visit Loran Senters, FNP [place] [facility] [address] At [facility]   03/05/23 Office Visit Loran Senters, FNP [place] [facility] [address] At [facility]   11/26/22 Office Visit Loran Senters, FNP [place] [facility] [address] At [facility]   08/21/22 Office Visit Loran Senters, FNP [place] [facility] [address] At [facility]   07/31/22 Office Visit Loran Senters, FNP [place] [facility] [address] At [facility]   Showing recent visits within past 365 days and meeting all other requirements  Future Appointments  Date Type Provider Dept   10/08/23 Appointment Loran Senters, FNP [place] [facility] [address] At [facility]   Showing future appointments within next 365 days and meeting all other requirements       Labs: A1c:   Hemoglobin A1C (%)   Date Value   07/08/2023 9.4 (A)   09/15/2014 6.0    Creatinine:   Creatinine (mg/dL)   Date Value   16/03/9603 0.92   01/17/2014 0.74    eGFR:   eGFR CKD-EPI (2021) Male (mL/min/1.48m2)   Date Value   03/05/2023 >90

## 2023-07-21 DIAGNOSIS — Z794 Long term (current) use of insulin: Principal | ICD-10-CM

## 2023-07-21 DIAGNOSIS — E1165 Type 2 diabetes mellitus with hyperglycemia: Principal | ICD-10-CM

## 2023-07-21 DIAGNOSIS — L732 Hidradenitis suppurativa: Principal | ICD-10-CM

## 2023-07-21 MED ORDER — LANTUS U-100 INSULIN 100 UNIT/ML SUBCUTANEOUS SOLUTION
Freq: Every evening | SUBCUTANEOUS | 2 refills | 120 days | Status: CP
Start: 2023-07-21 — End: 2024-07-19

## 2023-07-21 MED ORDER — HUMIRA(CF) PEN 80 MG/0.8 ML SUBCUTANEOUS KIT
11 refills | 0.00 days
Start: 2023-07-21 — End: ?

## 2023-07-21 NOTE — Unmapped (Signed)
Patient is requesting the following refill  Requested Prescriptions     Pending Prescriptions Disp Refills    insulin glargine (LANTUS U-100 INSULIN) 100 unit/mL injection 10 mL 0     Sig: Inject 0.05 mL (5 Units total) under the skin nightly.       Recent Visits  Date Type Provider Dept   07/08/23 Office Visit Loran Senters, FNP [place] [facility] [address] At [facility]   06/25/23 Office Visit Loran Senters, FNP [place] [facility] [address] At [facility]   04/23/23 Office Visit Loran Senters, FNP [place] [facility] [address] At [facility]   03/05/23 Office Visit Loran Senters, FNP [place] [facility] [address] At [facility]   11/26/22 Office Visit Loran Senters, FNP [place] [facility] [address] [facility]   08/21/22 Office Visit Loran Senters, FNP [place] [facility] [address] At [facility]   07/31/22 Office Visit Loran Senters, FNP [place] [facility] [address] At [facility]   Showing recent visits within past 365 days and meeting all other requirements  Future Appointments  Date Type Provider Dept   10/08/23 Appointment Loran Senters, FNP [place] [facility] [address] [facility]   Showing future appointments within next 365 days and meeting all other requirements       Labs: A1c:   Hemoglobin A1C (%)   Date Value   07/08/2023 9.4 (A)   09/15/2014 6.0

## 2023-07-22 MED ORDER — HUMIRA(CF) PEN 80 MG/0.8 ML SUBCUTANEOUS KIT
0 refills | 0.00 days | Status: CP
Start: 2023-07-22 — End: ?

## 2023-07-22 MED ORDER — OMNIPOD 5 PACK PODS
11 refills | 0.00 days
Start: 2023-07-22 — End: 2024-07-20

## 2023-07-23 MED ORDER — OMNIPOD 5 PACK PODS
11 refills | 0.00 days | Status: CP
Start: 2023-07-23 — End: 2024-07-20

## 2023-07-24 ENCOUNTER — Ambulatory Visit: Admit: 2023-07-24 | Discharge: 2023-07-24 | Payer: MEDICARE

## 2023-07-24 ENCOUNTER — Ambulatory Visit
Admit: 2023-07-24 | Discharge: 2023-07-24 | Payer: MEDICARE | Attending: Student in an Organized Health Care Education/Training Program | Primary: Student in an Organized Health Care Education/Training Program

## 2023-07-24 DIAGNOSIS — Z79899 Other long term (current) drug therapy: Principal | ICD-10-CM

## 2023-07-24 DIAGNOSIS — L732 Hidradenitis suppurativa: Principal | ICD-10-CM

## 2023-07-24 MED ORDER — CLINDAMYCIN 1 % LOTION
Freq: Two times a day (BID) | TOPICAL | 3 refills | 0.00 days | Status: CP
Start: 2023-07-24 — End: 2024-07-23

## 2023-07-24 MED ORDER — HUMIRA(CF) PEN 80 MG/0.8 ML SUBCUTANEOUS KIT
11 refills | 0.00 days | Status: CP
Start: 2023-07-24 — End: ?
  Filled 2023-07-25: qty 4, 28d supply, fill #0

## 2023-07-24 NOTE — Unmapped (Signed)
 [facility] Delivery Pharm[facility] Refill Coordination Note    Specialty Medication(s) to be Shipped:   Inflammatory Disorders: Humira    Other medication(s) to be shipped: No additional medications requested for fill at this time     Logan Quinn., DOB: Dec 26, 1980  Phone: There are no phone numbers on file.      All above HIPAA information was verified with patient.     Was a Nurse, learning disability used for this call? No    Completed refill call assessment today to schedule patient's medication shipment from the [facility]  [phone]).  All relevant notes have been reviewed.     Specialty medication(s) and dose(s) confirmed: Regimen is correct and unchanged.   Changes to medications: Logan Quinn reports no changes at this time.  Changes to insurance: No  New side effects reported not previously addressed with a pharmacist or physician: None reported  Questions for the pharmacist: No    Confirmed patient received a Conservation officer, historic buildings and a Surveyor, mining with first shipment. The patient will receive a drug information handout for each medication shipped and additional FDA Medication Guides as required.       DISEASE/MEDICATION-SPECIFIC INFORMATION        For patients on injectable medications: Patient currently has 1 doses left.  Next injection is scheduled for 2/13.    SPECIALTY MEDICATION ADHERENCE     Medication Adherence    Patient reported X missed doses in the last month: 0  Specialty Medication: adalimumab (HUMIRA,CF, PEN) 80 mg/0.8 mL PnKt  Patient is on additional specialty medications: No  Informant: patient              Were doses missed due to medication being on hold? No    Humira 80/0.8 mg/ml: 0 doses of medicine on hand     REFERRAL TO PHARMACIST     Referral to the pharmacist: Not needed      [facility]     Shipping address confirmed in Epic.       Delivery Scheduled: Yes, Expected medication delivery date: 12/12.     Medication will be delivered via Same Day Courier to the prescription address in Epic WAM.    Logan Quinn [facility] and [facility]

## 2023-07-24 NOTE — Unmapped (Signed)
Dermatology Note     Assessment and Plan:      Hidradenitis suppurativa, [place] stage II, chronic, well controlled  - Diagnosis, treatment options, prognosis, risk/ benefit, and side effects of treatment were discussed with the patient.   - s/p unroofing of right scrotum 2020  - Doing well on Humira 80 mg weekly with flares every few months and they are manageable. Jointly agreed to continue medication as below  - Continue humira 80mg  weekly   - Continue clindamycin 1% lotion topically nightly to the flaring lesions    High Risk Medication Use (Humira)  - Quant gold neg 07/2022. Reordered today.  - Hep serologies acceptable from 2019    The patient was advised to call for an appointment should any new, changing, or symptomatic lesions develop.     RTC: Return in about 9 months (around 04/22/2024) for follow up of HS. or sooner as needed   _________________________________________________________________      Chief Complaint     Chief Complaint   Patient presents with    Hidradenitis suppurativa     Slight improvement       HPI     Logan Quinn. is a 43 y.o. male who presents as a returning patient (last seen by Lowry Bowl, PA on 12/27/2022) to Dermatology for follow up of HS. At last visit, patient received ILK-40 on the suprapubic pannus and was to continue humira 80 mg injections weekly, continue clindamycin 1% lotion, and start keflex 500 mg 4 times daily for 14 days for HS and continue ketoconazole 2% cream for intertrigo.    HS  - Reports that HS is improving with only 2-3 flares this year   - Denies having side effects from Humira  - Endorses that he has noticed scarring in the groin area but no tenderness   - Notes that he is no longer taking the cephalexin    LOC of the neck  - Reports several skin tags on the neck   - States they are irritated by friction with clothing     The patient denies any other new or changing lesions or areas of concern.     Pertinent Past Medical History     No history of skin cancer    Family History:   Negative for melanoma    Past Medical History, Family History, Social History, Medication List, Allergies, and Problem List were reviewed in the rooming section of Epic.     ROS: Other than symptoms mentioned in the HPI, no fevers, chills, or other skin complaints    Physical Examination     GENERAL: Well-appearing male in no acute distress, resting comfortably.  NEURO: Alert and oriented, answers questions appropriately  PSYCH: Normal mood and affect  SKIN: Examination of the abdomen and groin was performed  - Atrophic scars of mons pubis and infrapannus   - No active HS on exam today     All areas not commented on are within normal limits or unremarkable    Scribe's Attestation: Lowry Bowl, PA-C obtained and performed the history, physical exam and medical decision making elements that were entered into the chart.  Signed by Cherlynn Kaiser, Scribe, on July 24, 2023 at 2:37 PM.    ----------------------------------------------------------------------------------------------------------------------  July 24, 2023 3:28 PM. Documentation assistance provided by the Scribe. I was present during the time the encounter was recorded. The information recorded by the Scribe was done at my direction and has been reviewed and validated by me.  ----------------------------------------------------------------------------------------------------------------------      (  Approved Template 02/21/2020)

## 2023-07-24 NOTE — Unmapped (Addendum)
For your HS:  Continue Humira injections weekly  Apply clindamycin 1-2 times a day as needed for flares     You are welcome to join the [facility] for HS of the [employer] Support group online at [email or web address] or in-person at our regular meetings.  You can textHS to [phone] for meeting reminders or join the group online for regular updates.  This can be a great opportunity to interact and learn from other patients and help work with the HS community.  We hope to see your there!    Hidradentis Suppurativa (pronounced ???high-drad-en-eye-tis/sup-your-uh-tee-vah???) is a chronic disease of hair follicles.  The lesions occur most commonly on areas of skin-to-skin contact: under the arms (axillary area), in the groin, around the buttocks, in the region around the anus and genitals, and on the skin between and under the breasts. In women, the underarms, groin, and breast areas are most commonly affected. Men most often have HS lesions on the buttocks and under the arms and may also have HS at the back of the neck and behind and around the ears.    What does HS look and feel like?   The first thing that someone with HS notices is a tender, raised, red bump that looks like an under-the-skin pimple or boil. Sometimes HS lesions have two or more ???heads.???  In mild disease only an occasional boil or abscess may occur, but in more active disease there can be many new lesions every month.  Some abscesses can become larger and may open and drain pus.  Bleeding and increased odor can also occur. In severe disease, deeper abscesses develop and may connect with each other under the skin to form tunnel-like tracts (sinuses, fistulas).  These may drain constantly, or may temporarily improve and then usually begin draining again over time.  In people who have had sinus tracts for some time, scars form that feel like ropes under the skin. In the very worst cases, networks of sinus tracts can form deeper in the body, including the muscle and other tissues. Many people with severe HS have scars that can limit their ability to freely move their arms or legs, though this is very unlikely for most patients.     Clinicians usually classify or ???grade??? HS using the [facility] staging system according to the severity of the disease for each body location:   [place] stage I: one or more abscesses are present, but no sinus tracts have formed and no scars have developed   Doreene Adas stage II: one or more abscesses are present that resolve and recur; on sinus tract can be present and scarring is seen   Doreene Adas stage III: many abscesses and more than one sinus tract is present with extensive scars.    What causes HS?  The cause of HS is not completely understood.  It seems to be a disorder of hair follicles and often many family members are affected so genetics probably play a strong role.  Bacteria are often present and may make the disease worse, but infection does not seem to be the main cause. Hormones are also likely play a role since the condition typically starts around puberty when hair follicles under the arms and in the groin start to change.  It can sometimes flare with menstrual cycles in women as well.  In most cases it lasts for decades and starts to improve to some extent in the late 30s and 40s as long as many fistulas have not already formed.  Women are three times more likely than men to develop HS.    Other factors are known to contribute to HS flaring or becoming worse, though they are likely not the main causes. The factors most commonly associated with HS include:   Cigarette smoking - Stopping smoking will likely not cure the disease, but likely is helpful in reducing how much and how often it flares and may prevent it from getting as bad over time.   Higher weight - HS may occur even in people that are not overweight, but it is much more common in patients that are.  There is some evidence that losing weight and eating a diet low in sugars and fats may be helpful in improving hidradenitis, though this is not helpful for everyone.  Working with a nutritionist may be an important way to help with this and is something your physician can help coordinate    Hidradenitis is not contagious.  It is not caused by a problem with personal hygiene or any other activity or behavior of those with the disease.    How can your doctor help you treat your hidradenitis?  Clinicians use both medication and surgery to treat HS. The choice of treatment--or combination of treatments--is made according to an individual patient???s needs. Clinicians consider several factors in determining the most appropriate plan for therapy:   Severity of disease - medications and some laser treatments are usually able to control disease best when fistulas are not present.  Fistulas typically require surgery.   Extent and location of disease   Chronicity (how often the lesions recur)    A number of different surgical methods have been developed that are useful for certain patients under particular circumstances. These can be done with local numbing and healing at home for some areas when disease is not too extensive with relatively brief recovery times.  In more extensive disease there may be a need for larger excisions under general anesthesia with healing time in the hospital and prolonged recovery periods for better disease control.      In addition, many medical treatments have been tried--some with more success than others. No medication is effective for all patients, and you and your doctor may have to try several different treatments or combinations of treatments before you find the treatment plan that works best for you.  The goals of therapy with medications that are either topical (used on the skin) or systemic (taken by mouth) are:  1. to clear the lesions or at least reduce their number and extent, and  2. to prevent new lesions from forming.  3. To reduce pain, drainage, and odor  Some of the types of medications commonly used are antibacterial skin washes and the topical antibiotics to prevent secondary infections and corticosteroid injections into the lesions to reduce inflammation.     Other medications that may be used include retinoids (similar to Accutane), drugs that effect how hormones and hair follicles interact, drugs that affect your immune system (such as methotrexate, adalimumab/Humira, and Remicaid/infliximab), steroids, and oral antibiotics.    Lasers that destroy hair follicles can also be helpful since they reduce the hair follicles that cause the problems.  Multiple treatments are typically required over time and there is some discomfort associated with treatment, but it is typically very fast and well-tolerated.    It is very important to realize that hidradenitis cannot usually be completely cured with any single medication or surgical procedure.  It is a disease that  can be very stubborn and difficult to control, but with good treatment a lot of improvement and sometimes temporary remissions can be obtained. Poorly controlled disease can cause more fistulas to form and make managing the disease much more difficult over time so it is important to seek care to reduce major flares.  Surgery can provide a long term cure in some areas, though the disease can start again or continue in nearby areas.  A dermatologist is often the best person to help coordinate disease treatment, and sometimes other surgeons, pain specialists, other specialists, and nutritionists may be part of the treatment team.    For severe disease, the first goal is often to reduce pain and symptoms with medicines so that the disease feels more stable. Once it's stable, we often start thinking about how to address areas that have completely gotten better with surgery if they are still causing problems.    What can you do to help your HS?  1. Stopping smoking is hard and may not fix everything, but it may be a step in the right direction.  We or your primary care physician can provide resources to help stop if you are interested.  2. Follow a healthy diet and try to achieve a healthy weight.  Some other self-help measures are:   Keep your skin cool and dry (becoming overheated and sweating can contribute to an HS flare)   To reduce the pain of cysts or nodules or to help them to drain, apply hot compresses or soak in hot water for 10 minutes at a time (use a clean washcloth or a teabag soaked in hot water)   For male patients, cotton underwear that does not have tight elastic in the groin can be helpful.  Boyshort, brief, or boxer style underwear may be a better option as friction on hair follicles in affected areas can be a major trigger in some patients.  These can be easily found on [place] or with some retailers.  Fruit of [employer] Loom and [employer]works are two brands that are sometimes recommended.    Finally, know that you are not alone. Coping with the pain and other symptoms of HS can be very difficult, so it may be helpful to connect with others who live with HS. Patient groups and networks can be sources of important information and support. Some internet resources for information and connections are provided below.    Psychologytoday.com is a resource to find psychologists and therapists that can help support you in your are     [email or web address] can help connect with sexual health resources and counselors    Resources for Information    The Hidradenitis Suppurativa Foundation: A nonprofit organized by a group of physicians interested in treating and advancing research in hidradenitis suppurativa.  This group advocates for better care and research for hidradenitis and has educational materials put together specifically for patients that have been reviewed and produced by doctors and people with hidradenitis.    American Academy of Dermatology  ARanked.fi    [employer]  ElevatorPitchers.de.html  [employer]ORD: IT trainer for [employer]  https://www.rarediseases.org/rare-disease-information/rare-diseases/byID/358/viewAbstract  Trials of new medications for HS  Https://www.clinicaltrials.gov

## 2023-07-28 LAB — QUANTIFERON TB GOLD PLUS
QUANTIFERON ANTIGEN 1 MINUS NIL: 0.06 [IU]/mL
QUANTIFERON ANTIGEN 2 MINUS NIL: 0.03 [IU]/mL
QUANTIFERON MITOGEN: 9.91 [IU]/mL
QUANTIFERON TB GOLD PLUS: NEGATIVE
QUANTIFERON TB NIL VALUE: 0.09 [IU]/mL

## 2023-07-28 LAB — TB NIL: TB NIL VALUE: 0.09

## 2023-07-28 LAB — TB AG1: TB AG1 VALUE: 0.15

## 2023-07-28 LAB — TB AG2: TB AG2 VALUE: 0.12

## 2023-07-28 LAB — TB MITOGEN: TB MITOGEN VALUE: 10

## 2023-08-01 NOTE — Unmapped (Signed)
 Blood sugars have been out of control the last 2 days.  Dexcom reading high so checked with his glucometer.  Today 364, 599, high reading, last was 585.  Constantly using the bathroom and thirsty.  On 10 units of Lantus nightly and does have Omnipod.    He did just take his nightly glipizide about an hour early and now sugar is down to 305.

## 2023-08-01 NOTE — Unmapped (Signed)
 Logan Quinn notified by phone.  He will bring in Omnipod dosing next week.  Front staff will call him to schedule.

## 2023-08-04 NOTE — Unmapped (Signed)
 Office visit

## 2023-08-08 ENCOUNTER — Ambulatory Visit: Admit: 2023-08-08 | Discharge: 2023-08-09 | Payer: MEDICARE | Attending: Family | Primary: Family

## 2023-08-08 DIAGNOSIS — Z794 Long term (current) use of insulin: Principal | ICD-10-CM

## 2023-08-08 DIAGNOSIS — E1165 Type 2 diabetes mellitus with hyperglycemia: Principal | ICD-10-CM

## 2023-08-08 MED ORDER — DULAGLUTIDE 1.5 MG/0.5 ML SUBCUTANEOUS PEN INJECTOR
SUBCUTANEOUS | 2 refills | 28.00 days | Status: CP
Start: 2023-08-08 — End: ?

## 2023-08-12 MED ORDER — ACCU-CHEK GUIDE TEST STRIPS
ORAL_STRIP | Freq: Three times a day (TID) | 3 refills | 0.00 days | Status: CP
Start: 2023-08-12 — End: ?

## 2023-08-12 NOTE — Progress Notes (Unsigned)
 MRN : [id number]  Jason Davidson is a 43 y.o. (May 21, 1981) male who presents with chief complaint of legs swell.  History of Present Illness:   The patient returns to the office for followup evaluation regarding leg swelling.  The swelling has persisted but with the lymph pump the patient states the swelling is much better controlled. The pain associated with swelling is improved. There have not been any interval development of a ulcerations or wounds.  No episodes of cellulitis or infection over the past 12 months   The patient uses his pump almost every day and has noted a significant amount of wear in the leggings and there are some problems with a couple of the hoses.  Otherwise, it is working well.   Since the previous visit the patient has been wearing graduated compression stockings and using the lymph pump on a routine basis and  has noted significant improvement in the lymphedema.    Patient stated the lymph pump has been helpful in treating the lymphedema.   No outpatient medications have been marked as taking for the 08/14/23 encounter (Appointment) with Gilda Crease, Latina Craver, MD.    Past Medical History:  Diagnosis Date   Asthma    Depressed    Diabetes mellitus without complication (HCC)    Gout    IBS (irritable bowel syndrome)    Morbid obesity (HCC)    Sleep apnea    Thyroid disease     Past Surgical History:  Procedure Laterality Date   COLONOSCOPY     ESOPHAGOGASTRODUODENOSCOPY ENDOSCOPY     EYE SURGERY     LASER ABLATION INCOMPETENT VEIN INI      Social History Social History   Tobacco Use   Smoking status: Never   Smokeless tobacco: Never  Vaping Use   Vaping status: Never Used  Substance Use Topics   Alcohol use: No   Drug use: No    Family History Family History  Problem Relation Age of Onset   Diabetes Mother    Depression Mother    Diabetes Sister    Diabetes Brother    Diabetes Maternal Uncle     Diabetes Maternal Grandmother    Heart disease Maternal Grandmother    Cancer Maternal Grandfather    COPD Paternal Grandmother    Cancer Paternal Grandfather    Varicose Veins Paternal Grandfather    Healthy Father     Allergies  Allergen Reactions   Erythromycin Nausea And Vomiting   Metronidazole Nausea And Vomiting   Penicillins Swelling   Sulfa Antibiotics Nausea And Vomiting     REVIEW OF SYSTEMS (Negative unless checked)  Constitutional: [] Weight loss  [] Fever  [] Chills Cardiac: [] Chest pain   [] Chest pressure   [] Palpitations   [] Shortness of breath when laying flat   [] Shortness of breath with exertion. Vascular:  [] Pain in legs with walking   [x] Pain in legs with standing  [] History of DVT   [] Phlebitis   [x] Swelling in legs   [] Varicose veins   [] Non-healing ulcers Pulmonary:   [] Uses home oxygen   [] Productive cough   [] Hemoptysis   [] Wheeze  [] COPD   [x] Asthma Neurologic:  [] Dizziness   [] Seizures   [] History of stroke   [] History of TIA  [] Aphasia   [] Vissual changes   [] Weakness or numbness in arm   [] Weakness or  numbness in leg Musculoskeletal:   [] Joint swelling   [x] Joint pain   [] Low back pain Hematologic:  [] Easy bruising  [] Easy bleeding   [] Hypercoagulable state   [] Anemic Gastrointestinal:  [] Diarrhea   [] Vomiting  [x] Gastroesophageal reflux/heartburn   [] Difficulty swallowing. Genitourinary:  [] Chronic kidney disease   [] Difficult urination  [] Frequent urination   [] Blood in urine Skin:  [] Rashes   [] Ulcers  Psychological:  [x] History of anxiety   [x]  History of major depression.  Physical Examination  There were no vitals filed for this visit. There is no height or weight on file to calculate BMI. Gen: WD/WN, NAD Head: [place]/AT, No temporalis wasting.  Ear/Nose/Throat: Hearing grossly intact, nares w/o erythema or drainage, pinna without lesions Eyes: PER, EOMI, sclera nonicteric.  Neck: Supple, no gross masses.  No JVD.  Pulmonary:  Good air movement,  no audible wheezing, no use of accessory muscles.  Cardiac: RRR, precordium not hyperdynamic. Vascular:  scattered varicosities present bilaterally.  Mild venous stasis changes to the legs bilaterally.  3-4+ soft pitting edema, CEAP [id number]  Vessel Right Left  Radial Palpable Palpable  Gastrointestinal: soft, non-distended. No guarding/no peritoneal signs.  Musculoskeletal: M/S 5/5 throughout.  No deformity.  Neurologic: CN 2-12 intact. Pain and light touch intact in extremities.  Symmetrical.  Speech is fluent. Motor exam as listed above. Psychiatric: Judgment intact, Mood & affect appropriate for pt's clinical situation. Dermatologic: Venous rashes no ulcers noted.  No changes consistent with cellulitis. Lymph : No lichenification or skin changes of chronic lymphedema.  CBC Lab Results  Component Value Date   WBC 8.0 02/14/2014   HGB 14.8 02/14/2014   HCT 43.4 02/14/2014   MCV 94 02/14/2014   PLT 175 02/14/2014    BMET    Component Value Date/Time   NA 137 02/14/2014 0437   K 3.8 02/14/2014 0437   CL 101 02/14/2014 0437   CO2 26 02/14/2014 0437   GLUCOSE 307 (H) 02/14/2014 0437   BUN 12 02/14/2014 0437   CREATININE 1.01 02/14/2014 0437   CALCIUM 8.4 (L) 02/14/2014 0437   GFRNONAA >60 02/14/2014 0437   GFRAA >60 02/14/2014 0437   CrCl cannot be calculated (Patient's most recent lab result is older than the maximum 21 days allowed.).  COAG No results found for: "INR", "PROTIME"  Radiology No results found.   Assessment/Plan There are no diagnoses linked to this encounter.   Levora Dredge, MD  08/12/2023 12:23 PM

## 2023-08-14 ENCOUNTER — Ambulatory Visit (INDEPENDENT_AMBULATORY_CARE_PROVIDER_SITE_OTHER): Payer: Medicaid Other | Admitting: Vascular Surgery

## 2023-08-14 ENCOUNTER — Encounter (INDEPENDENT_AMBULATORY_CARE_PROVIDER_SITE_OTHER): Payer: Self-pay | Admitting: Vascular Surgery

## 2023-08-14 VITALS — BP 165/71 | HR 94 | Resp 18 | Ht 71.0 in | Wt >= 6400 oz

## 2023-08-14 DIAGNOSIS — J4520 Mild intermittent asthma, uncomplicated: Secondary | ICD-10-CM

## 2023-08-14 DIAGNOSIS — Z794 Long term (current) use of insulin: Secondary | ICD-10-CM

## 2023-08-14 DIAGNOSIS — I10 Essential (primary) hypertension: Secondary | ICD-10-CM | POA: Diagnosis not present

## 2023-08-14 DIAGNOSIS — E1142 Type 2 diabetes mellitus with diabetic polyneuropathy: Secondary | ICD-10-CM

## 2023-08-14 DIAGNOSIS — I890 Lymphedema, not elsewhere classified: Secondary | ICD-10-CM

## 2023-08-14 NOTE — Unmapped (Signed)
Data reviewed

## 2023-08-16 NOTE — Unmapped (Signed)
 Assessment and Plan:     Type 2 diabetes mellitus with hyperglycemia, with long-term current use of insulin (CMS-HCC)    - dulaglutide 1.5 mg/0.5 mL PnIj; Inject 0.5 mL (1.5 mg total) under the skin every seven (7) days.         I personally spent 34 minutes face-to-face and non-face-to-face in the care of this patient, which includes all pre, intra, and post visit time on the date of service.    Return for has return appointment 10/08/23.    HPI:      Logan Quinn. is here for   Chief Complaint   Patient presents with    Elevated blood sugars     Answers submitted by the patient for this visit:  Diabetes Questionnaire (Submitted on 08/05/2023)  Chief Complaint: Diabetes problem  Diabetes type: type 2  MedicAlert ID: No  Disease duration: 11 Years  blurred vision: No  chest pain: No  fatigue: Yes  foot paresthesias: No  foot ulcerations: No  polydipsia: Yes  polyphagia: Yes  polyuria: Yes  visual change: No  weakness: No  weight loss: No  Symptom course: improving  confusion: No  speech difficulty: No  dizziness: No  nervous/anxious: No  headaches: No  hunger: No  mood changes: No  pallor: No  seizures: No  tremors: No  sleepiness: No  sweats: No  blackouts: No  hospitalization: No  nocturnal hypoglycemia: No  required assistance: No  required glucagon: No  CVA: No  heart disease: No  impotence: Yes  nephropathy: No  peripheral neuropathy: No  PVD: No  retinopathy: No  CAD risks: dyslipidemia, family history, hypertension, obesity, sedentary lifestyle, stress  Current treatments: diet, insulin injections, insulin pump, oral agent (monotherapy)  Treatment compliance: most of the time  Dose schedule: pre-breakfast, pre-lunch, pre-dinner, at bedtime  Given by: patient, significant other  Injection sites: abdominal wall, arms, buttocks, thighs  Home blood tests: 5+ x per day  Monitoring compliance: good  Blood glucose trend: increasing steadily  breakfast time: 7-8 am  breakfast glucose level: >200  lunch time: 12-1 pm  lunch glucose level: >200  dinner time: 5-6 pm  dinner glucose level: >200  Bedtime: 10-11 pm  Bedtime glucose level: >200  High score: >200  Overall: >200  Weight trend: fluctuating minimally  Current diet: diabetic, generally healthy  Meal planning: avoidance of concentrated sweets, calorie counting, carbohydrate counting  Exercise: intermittently  Dietitian visit: Yes  Eye exam current: No  Sees podiatrist: Yes            ROS:      Comprehensive 10 point ROS negative unless otherwise stated in the HPI.      [facility] Components:     Medication adherence and barriers to the treatment plan have been addressed. Opportunities to optimize healthy behaviors have been discussed. Patient / caregiver voiced understanding.    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)     Obstructive sleep apnea     OSA on CPAP     Retinal detachment 04/2014    Severe obstructive sleep apnea     Trapezius muscle strain 12/07/2013    Urinary incontinence, nocturnal enuresis     Venous insufficiency      Past Surgical History:   Procedure Laterality Date    COLONOSCOPY      CYSTOSCOPY      EYE SURGERY  04/2014    LASER ABLATION INCOMPETENT VEIN INI [facility] HISTORICAL RESULT) Right     PR COLONOSCOPY FLX DX W/COLLJ SPEC WHEN PFRMD N/A 03/24/2013    Procedure: COLONOSCOPY, FLEXIBLE, PROXIMAL TO SPLENIC FLEXURE; DIAGNOSTIC, W/WO COLLECTION SPECIMEN BY BRUSH OR WASH;  Surgeon: Clint Bolder, MD;  [facility];  Service: Gastroenterology    PR EYE SURG POST SGMT PROC UNLISTED Left     pneumatic retinopexy OS    PR UPPER GI ENDOSCOPY,DIAGNOSIS N/A 02/02/2013    Procedure: UGI ENDO, INCLUDE ESOPHAGUS, STOMACH, & DUODENUM &/OR JEJUNUM; DX W/WO COLLECTION SPECIMN, BY BRUSH OR WASH;  Surgeon: Malcolm Metro, MD;  Location: [facility];  Service: Gastroenterology    SKIN BIOPSY      UPPER GASTROINTESTINAL ENDOSCOPY      [place] PYLORIC STENOSIS ([facility] HISTORICAL RESULT)         Family History:     Family History   Problem Relation Age of Onset    Cancer Maternal Grandfather         Stomach Cancer    Hearing loss Maternal Grandfather     GER disease Maternal Grandfather     Cancer Paternal Grandfather         Bone Cancer, Lung Cancer    COPD Paternal Grandmother     Arthritis Paternal Grandmother     Depression Paternal Grandmother     Osteoporosis Paternal Grandmother     Diabetes Mother     Heart disease Mother     Migraines Mother     Arthritis Mother     Depression Mother     GER disease Mother     Hypertension Mother     Angina Mother     COPD Mother     Glaucoma Mother     Hearing loss Mother     Cataracts Mother     Stroke Mother     Miscarriages / [place] Mother     Diabetes Sister     Migraines Sister     Asthma Sister     Depression Sister     Hearing loss Sister     Diabetes Brother     Asthma Brother     Diabetes Maternal Grandmother     Heart disease Maternal Grandmother     Migraines Maternal Grandmother     Depression Maternal Grandmother     Angina Maternal Grandmother     Hypertension Maternal Grandmother     Stroke Maternal Grandmother     GER disease Maternal Grandmother     Diabetes Maternal Uncle     Hearing loss Maternal Uncle     Diabetes Maternal Uncle         resulted in need for kidney transplant    Liver disease Maternal Uncle     Kidney disease Maternal Uncle  needed kidney transplant    Depression Maternal Uncle     Asthma Brother     Gout Father     No Known Problems Paternal Aunt     No Known Problems Paternal Uncle     No Known Problems Other     Diabetes Sister     Asthma Sister     Depression Sister     Hearing loss Sister     Migraines Sister     Diabetes Brother     Asthma Brother     Broken bones Brother     Diabetes Maternal Uncle     Hearing loss Maternal Uncle     Asthma Brother     Colorectal Cancer Neg Hx     Esophageal cancer Neg Hx     Liver cancer Neg Hx     Pancreatic cancer Neg Hx     Stomach cancer Neg Hx     Amblyopia Neg Hx     Blindness Neg Hx     Retinal detachment Neg Hx     Strabismus Neg Hx     Macular degeneration Neg Hx     Anesthesia problems Neg Hx     Clotting disorder Neg Hx     Collagen disease Neg Hx     Dislocations Neg Hx     Fibromyalgia Neg Hx     Hemophilia Neg Hx     Rheumatologic disease Neg Hx     Scoliosis Neg Hx     Severe sprains Neg Hx     Sickle cell anemia Neg Hx     Spinal Compression Fracture Neg Hx     Melanoma Neg Hx     Basal cell carcinoma Neg Hx     Squamous cell carcinoma Neg Hx     Deep vein thrombosis Neg Hx     Thyroid disease Neg Hx        Social History:     Social History     Tobacco Use    Smoking status: Never     Passive exposure: Never    Smokeless tobacco: Never   Vaping Use    Vaping status: Never Used   Substance Use Topics    Alcohol use: Never     Comment: rare social    Drug use: Never       Allergies:     Erythromycin, Penicillins, Sulfa (sulfonamide antibiotics), Other, and Metronidazole    Current Medications:     Current Outpatient Medications   Medication Sig Dispense Refill    adalimumab (HUMIRA,CF, PEN) 80 mg/0.8 mL PnKt Inject the contents of 1 pen (80 mg) subcutaneously once every week 4 each 11    allopurinol (ZYLOPRIM) 300 MG tablet Take 1 tablet (300 mg total) by mouth daily. 90 tablet 3    ALPRAZolam (XANAX) 0.5 MG tablet Takes 1 tablet nightly and 1 tablet a day when needed      ascorbic acid (VITAMIN C ORAL) Take 1 capsule by mouth nightly.      atorvastatin (LIPITOR) 20 MG tablet Take 1 tablet (20 mg total) by mouth daily. 90 tablet 3    blood-glucose meter kit Use as directed 1 each 0    clindamycin (CLEOCIN T) 1 % lotion Apply topically two (2) times a day. To affected areas on body as needed for flares 60 mL 3    clobetasoL (TEMOVATE) 0.05 % ointment Apply daily to painful affected areas if needed for flares, then stop 15 g 1  dextroamphetamine sulfate (DEXTROSTAT) 10 MG tablet Take 2 tablets (20 mg total) by mouth two (2) times a day.      dicyclomine (BENTYL) 10 mg capsule Take 1 capsule (10 mg total) by mouth Four (4) times a day (before meals and nightly). 120 capsule 1    diphenoxylate-atropine (LOMOTIL) 2.5-0.025 mg per tablet Take 1 tablet by mouth four (4) times a day as needed for diarrhea. 30 tablet 1    DULoxetine (CYMBALTA) 60 MG capsule Take 1 capsule (60 mg total) by mouth Two (2) times a day. 180 capsule 0    empty container Misc Use as directed to dispose of needles. When full, make sure lid is closed tightly then dispose of container in trash. 1 each 2    erenumab-aooe (AIMOVIG AUTOINJECTOR) auto-injector Inject 1 Pen under the skin every thirty (30) days. 3 mL 1    famotidine (PEPCID) 20 MG tablet Take 1 tablet (20 mg total) by mouth two (2) times a day. 180 tablet 3    fluticasone propionate (FLONASE) 50 mcg/actuation nasal spray 1 spray into each nostril two (2) times a day. 16 g 5    glipiZIDE (GLUCOTROL) 10 MG tablet Take 2 tablets (20 mg total) by mouth Two (2) times a day (30 minutes before a meal). 360 tablet 3    HUMALOG U-100 INSULIN 100 unit/mL injection INJECT 120 UNITS UNDER THE SKIN DAILY VIA OMNIPOD 100 mL 3    hydrOXYzine (ATARAX) 25 MG tablet Take 1 tablet (25 mg total) by mouth Three (3) times a day as needed. May take two before bed for sleep. 120 tablet 1    insulin glargine (LANTUS U-100 INSULIN) 100 unit/mL injection Inject 0.05 mL (5 Units total) under the skin nightly. (Patient taking differently: Inject 0.2 mL (20 Units total) under the skin nightly.) 6 mL 2    insulin pump cart,automated,BT (OMNIPOD 5 G6 PODS, GEN 5,) Crtg Change POD every 48 hours. 3 each 11    insulin syringe-needle U-100 (BD INSULIN SYRINGE ULTRA-FINE) 1 mL 31 gauge x 5/16 (8 mm) Syrg Once daily for insulin dosing. 100 each 1    insulin syringe-needle U-100 1 mL 31 gauge x 5/16 (8 mm) Syrg USE AS DIRECTED ONE TIME DAILY FOR INSULIN DOSING      lancets (ACCU-CHEK FASTCLIX LANCET DRUM) Misc Use to check blood sugars 3 times a day before meals or as directed 200 each 5    levothyroxine (SYNTHROID) 50 MCG tablet Take 1 tablet (50 mcg total) by mouth daily. 90 tablet 1    losartan (COZAAR) 50 MG tablet Take 1 tablet (50 mg total) by mouth daily. 90 tablet 3    montelukast (SINGULAIR) 10 mg tablet Take 1 tablet (10 mg total) by mouth daily. 90 tablet 3    multivitamin (MULTIPLE VITAMIN ORAL) Take 1 tablet by mouth daily.      OMNIPOD 5 PACK PODS Omnipod 5 G6/G7 system POD (Gen 5) (5 PODS) Change POD every 48 hours 3 each 11    ondansetron (ZOFRAN) 4 MG tablet Take 1 tablet (4 mg total) by mouth every eight (8) hours as needed for nausea. 30 tablet 1    oxybutynin (DITROPAN) 5 MG tablet Take 1 tablet (5 mg total) by mouth two (2) times a day. 180 tablet 1    propranoloL (INDERAL LA) 120 mg 24 hr capsule Take 1 capsule (120 mg total) by mouth daily. 90 capsule 1    ramelteon (ROZEREM) 8 mg tablet Take 1 tablet (  8 mg total) by mouth nightly.      safety needles (BD SAFETYGLIDE NEEDLE) 18 gauge x 1 1/2 Ndle USE ONE NEEDLE ONCE WEEKLY      sour cherry extract (TART CHERRY EXTRACT) 1,000 mg cap       syringe with needle (BD LUER-LOK SYRINGE) 3 mL 21 gauge x 1 1/2 Syrg To draw up testosterone once weekly 100 each 0    tadalafil (CIALIS) 5 MG tablet Take 1 tablet (5 mg total) by mouth in the morning. 90 tablet 3    testosterone cypionate (DEPOTESTOTERONE CYPIONATE) 200 mg/mL injection Inject 0.5 mL (100 mg total) into the muscle once a week. Inject 0.5cc (100mg ) weekly 2 mL 5    topiramate (TOPAMAX) 50 MG tablet Take 1.5 tablets (75 mg total) by mouth two (2) times a day. 270 tablet 3    WELLBUTRIN XL 150 mg 24 hr tablet Take 1 tablet (150 mg total) by mouth every morning.      acetaminophen-codeine (TYLENOL #3) 300-30 mg per tablet Take 1 tablet by mouth Three (3) times a day as needed for pain, moderate pain or severe pain. 15 tablet 0    albuterol 2.5 mg /3 mL (0.083 %) nebulizer solution Inhale 3 mL (2.5 mg total) by nebulization every four (4) hours as needed for wheezing. (Patient not taking: Reported on 08/21/2022) 180 mL 2    albuterol HFA 90 mcg/actuation inhaler Inhale 2 puffs every six (6) hours as needed for wheezing. (Patient not taking: Reported on 08/08/2023) 8.5 g 5    blood sugar diagnostic (ACCU-CHEK GUIDE TEST STRIPS) Strp by Other route Three (3) times a day before meals. 300 strip 3    cetirizine (ALLERGY RELIEF, CETIRIZINE,) 10 MG tablet Take 1 tablet (10 mg total) by mouth daily. (Patient not taking: Reported on 07/24/2023) 90 tablet 3    colchicine (MITIGARE) 0.6 mg cap capsule Take 1 capsule (0.6 mg total) by mouth daily. (Patient not taking: Reported on 08/08/2023) 90 capsule 0    cyclobenzaprine (FLEXERIL) 10 MG tablet Take 1 tablet (10 mg total) by mouth Three (3) times a day as needed for muscle spasms. (Patient not taking: Reported on 08/08/2023) 60 tablet 1    dulaglutide 1.5 mg/0.5 mL PnIj Inject 0.5 mL (1.5 mg total) under the skin every seven (7) days. 2 mL 2    fluconazole (DIFLUCAN) 150 MG tablet Take 1 tablet (150 mg total) by mouth daily. Take 1 tablet then repeat 1 tablet in 5 days. (Patient not taking: Reported on 06/25/2023) 2 tablet 0    fluocinonide (LIDEX) 0.05 % gel Apply 1 Application topically Three (3) times a day. (Patient not taking: Reported on 07/08/2023)      fluoride, sodium, 1.1 % Pste  (Patient not taking: Reported on 08/08/2023)      hydrocortisone (PROCTOSOL HC) 2.5 % rectal cream Insert 1 Application into the rectum two (2) times a day as needed. (Patient not taking: Reported on 07/08/2023) 28.35 g 2    ibuprofen (ADVIL,MOTRIN) 800 MG tablet Take 1 tablet (800 mg total) by mouth every eight (8) hours as needed. (Patient not taking: Reported on 07/08/2023) 90 tablet 1    lidocaine (XYLOCAINE) 5 % ointment Apply topically Three (3) times a day as needed. (Patient not taking: Reported on 06/25/2023) 30 g 1    LIDOCAINE 2 % solution Swish and spit three times a day as needed (Patient not taking: Reported on 07/08/2023)      polyethylene glycol (GLYCOLAX) 17  gram/dose powder  (Patient not taking: Reported on 06/25/2023)      traMADol (ULTRAM) 50 mg tablet Take 1 tablet (50 mg total) by mouth every eight (8) hours as needed. (Patient not taking: Reported on 07/24/2023)       No current facility-administered medications for this visit.       Health Maintenance:     Health Maintenance   Topic Date Due    COVID-19 Vaccine (3 - Pfizer risk series) 01/24/2021    Retinal Eye Exam  10/31/2022    DTaP/Tdap/Td Vaccines (4 - Td or Tdap) 07/17/2023    Hemoglobin A1c  10/06/2023    Foot Exam  11/25/2023    Urine Albumin/Creatinine Ratio  11/26/2023    Serum Creatinine Monitoring  03/04/2024    Potassium Monitoring  03/04/2024    COPD Spirometry  08/06/2026    Pneumococcal Vaccine 0-49  Completed    Hepatitis C Screen  Completed    Influenza Vaccine  Completed       Immunizations:     Immunization History   Administered Date(s) Administered    COVID-19 VACC,MRNA,(PFIZER)(PF) 12/04/2020, 12/27/2020    HEPATITIS B VACCINE ADULT,IM(ENERGIX B, RECOMBIVAX) 03/05/2013, 04/05/2013, 09/03/2013    Hepatitis B Vaccine, Unspecified Formulation 12/27/1999, 04/29/2000    INFLUENZA INJ MDCK PF, QUAD,(FLUCELVAX)(12MO AND UP EGG FREE) 03/31/2019    INFLUENZA TIV (TRI) PF (IM)(HISTORICAL) 10/08/2011    INFLUENZA VACCINE IIV3(IM)(PF)6 MOS UP 03/05/2023    Influenza Vaccine Quad(IM)6 MO-Adult(PF) 03/05/2013, 03/18/2014, 03/22/2015, 03/05/2016, 03/19/2017, 02/23/2018, 02/19/2022    Influenza Virus Vaccine, unspecified formulation 03/19/2017, 02/23/2018, 03/25/2019, 03/10/2020, 04/05/2021    PNEUMOCOCCAL POLYSACCHARIDE 23-VALENT 12/30/2012    PPD Test 10/28/2016    Pneumococcal Conjugate 20-valent 01/02/2022    TD(TDVAX),ADSORBED,2LF(IM)(PF) 04/29/2000    TdaP 07/18/2008, 07/16/2013     I have reviewed and (if needed) updated the patient's problem list, medications, allergies, past medical and surgical history, social and family history.     Vital Signs:     Wt Readings from Last 3 Encounters:   08/08/23 (!) 215.5 kg (475 lb)   07/08/23 (!) 212.3 kg (468 lb)   06/25/23 (!) 212.3 kg (468 lb)     Temp Readings from Last 3 Encounters:   08/08/23 36.7 ??C (98 ??F) (Oral)   07/08/23 36.9 ??C (98.4 ??F) (Oral)   06/25/23 36.8 ??C (98.2 ??F) (Oral)     BP Readings from Last 3 Encounters:   08/08/23 130/76   07/08/23 142/84   06/25/23 110/78     Pulse Readings from Last 3 Encounters:   08/08/23 94   07/08/23 96   06/25/23 89     Estimated body mass index is 66.25 kg/m?? as calculated from the following:    Height as of this encounter: 180.3 cm (5' 11).    Weight as of this encounter: 215.5 kg (475 lb).  Facility age limit for growth %iles is 20 years.      Objective:      General: Alert and oriented x3. Well-appearing. No acute distress.   HEENT:  Normocephalic.  Atraumatic. Conjunctiva and sclera normal. OP MMM without lesions.   Neck:  Supple. No thyroid enlargement. No adenopathy.   Heart:  Regular rate and rhythm. Normal S1, S2. No murmurs, rubs or gallops.   Lungs:  No respiratory distress.  Lungs clear to auscultation. No wheezes, rhonchi, or rales.   GI/GU:  Soft, +BS, nondistended, non-TTP. No palpable masses or organomegaly.   Extremities:  Lymphedema bilateral LE. Peripheral pulses normal.  Skin:  Warm, dry. No rash or lesions present.   Neuro:  Non-focal. No obvious weakness.   Psych:  Affect normal, eye contact good, speech clear and coherent.         [name], FNP

## 2023-08-20 DIAGNOSIS — L732 Hidradenitis suppurativa: Principal | ICD-10-CM

## 2023-08-20 MED ORDER — DOXYCYCLINE HYCLATE 100 MG CAPSULE
ORAL_CAPSULE | Freq: Two times a day (BID) | ORAL | 0 refills | 45.00 days | Status: CP
Start: 2023-08-20 — End: ?

## 2023-08-21 MED ORDER — INSULIN SYRINGE U-100 WITH NEEDLE 1 ML 31 GAUGE X 5/16" (8 MM)
1 refills | 0.00 days
Start: 2023-08-21 — End: 2024-08-20

## 2023-08-21 NOTE — Unmapped (Signed)
 Abstraction Result Flowsheet Data    This patient's last AWV date: [facility] Last Medicare Wellness Visit Date: Not Found  This patients last WCC/CPE date: : 02/19/2022      Reason for Encounter  Reason for Encounter: Outreach  Primary Reason for Outreach: AWV  Text Message: No  MyChart Message: No  Outreach Call Outcome: No Voicemail Available

## 2023-08-24 DIAGNOSIS — L732 Hidradenitis suppurativa: Principal | ICD-10-CM

## 2023-08-24 MED ORDER — PREDNISONE 10 MG TABLET
ORAL_TABLET | ORAL | 0 refills | 20.00 days | Status: CP
Start: 2023-08-24 — End: 2023-09-13

## 2023-08-24 NOTE — Unmapped (Signed)
 [facility] Refill Coordination Note    Specialty Medication(s) to be Shipped:   Inflammatory Disorders: Humira    Other medication(s) to be shipped: No additional medications requested for fill at this time     Logan Cuaresma., DOB: 01-11-1981  Phone: There are no phone numbers on file.      All above HIPAA information was verified with patient.     Was a Nurse, learning disability used for this call? No    Completed refill call assessment today to schedule patient's medication shipment from the [facility]  [phone]).  All relevant notes have been reviewed.     Specialty medication(s) and dose(s) confirmed: Regimen is correct and unchanged.   Changes to medications: Saharsh reports no changes at this time.  Changes to insurance: No  New side effects reported not previously addressed with a pharmacist or physician: None reported  Questions for the pharmacist: No    Confirmed patient received a Conservation officer, historic buildings and a Surveyor, mining with first shipment. The patient will receive a drug information handout for each medication shipped and additional FDA Medication Guides as required.       DISEASE/MEDICATION-SPECIFIC INFORMATION        For patients on injectable medications: Patient currently has maybe 1 doses left.  Next injection is scheduled for 3/20.    SPECIALTY MEDICATION ADHERENCE     Medication Adherence    Patient reported X missed doses in the last month: 0  Specialty Medication: adalimumab (HUMIRA,CF, PEN) 80 mg/0.8 mL PnKt  Patient is on additional specialty medications: No  Informant: patient              Were doses missed due to medication being on hold? No    Humira 80/0.8 mg/ml: maybe 1 doses of medicine on hand     REFERRAL TO PHARMACIST     Referral to the pharmacist: Not needed      [facility]     Shipping address confirmed in Epic.       Delivery Scheduled: Yes, Expected medication delivery date: 3/19.     Medication will be delivered via Same Day Courier to the prescription address in Epic WAM.    [facility]

## 2023-08-27 DIAGNOSIS — L732 Hidradenitis suppurativa: Principal | ICD-10-CM

## 2023-08-27 MED ORDER — TRAMADOL 50 MG TABLET
ORAL_TABLET | Freq: Four times a day (QID) | ORAL | 0 refills | 5.00 days | Status: CP
Start: 2023-08-27 — End: ?

## 2023-08-27 MED FILL — HUMIRA(CF) PEN 80 MG/0.8 ML SUBCUTANEOUS KIT: 28 days supply | Qty: 4 | Fill #1

## 2023-08-28 ENCOUNTER — Ambulatory Visit
Admission: EM | Admit: 2023-08-28 | Discharge: 2023-08-28 | Disposition: A | Discharge status: Home / Self Care | Attending: Physician Assistant | Admitting: Physician Assistant

## 2023-08-28 DIAGNOSIS — L509 Urticaria, unspecified: Secondary | ICD-10-CM

## 2023-08-28 DIAGNOSIS — L732 Hidradenitis suppurativa: Secondary | ICD-10-CM

## 2023-08-28 MED ORDER — CLINDAMYCIN HCL 300 MG PO CAPS
300.0000 mg | ORAL_CAPSULE | Freq: Three times a day (TID) | ORAL | 0 refills | Status: AC
Start: 2023-08-28 — End: 2023-09-04

## 2023-08-28 NOTE — ED Provider Notes (Signed)
 [facility]    CSN: [id number] Arrival date & time: 08/28/23  1140      History   Chief Complaint Chief Complaint  Patient presents with   Rash    HPI Jason Davidson is a 43 y.o. male with history of asthma, obesity, hidradenitis suppurativa, thyroid disease, sleep apnea, IBS, diabetes and gout presents for urticarial pruritic rash of entire body that began this morning.  No associated swelling, difficulty swallowing, shortness of breath or wheezing.  Patient says he has been taking doxycycline for HS flare up for the past 6 days. Has had this medicine before and no issues. Patient is also currently on prednisone and has been on that for the past 3 days. He was prescribed 30mg  x 5 days, 20 mg x 5 days, 10 mg x 5 days and 5 mg x 5 days.  Patient says he has pain on both sides of his groin related to the at bedtime.  He thinks it has improved since starting antibiotics.  Denies any pustular drainage.  Patient reports that he recently bleached his hair and got a different type of bleach. Unsure if that resulted in the rash.   No new foods or changes to detergent/body wash.  Cannot identify any other potential triggers.  HPI  Past Medical History:  Diagnosis Date   Asthma    Depressed    Diabetes mellitus without complication (HCC)    Gout    IBS (irritable bowel syndrome)    Morbid obesity (HCC)    Sleep apnea    Thyroid disease     Patient Active Problem List   Diagnosis Date Noted   Sprain of left ankle 07/03/2022   Tinea pedis 03/18/2022   Chronic pansinusitis 11/28/2021   Cough variant asthma 11/28/2021   Environmental and seasonal allergies 11/28/2021   Erectile dysfunction 11/28/2021   Nasal congestion 11/28/2021   Sinus congestion 11/28/2021   Mild intermittent asthma without complication 10/11/2021   Essential hypertension 10/10/2021   Acquired hypothyroidism 01/09/2021   Acute non-recurrent pansinusitis 07/19/2020   Hidradenitis suppurativa  04/29/2018   Allergic rhinitis 12/23/2017   Varicose veins of both lower extremities with pain 10/16/2017   IBS (irritable bowel syndrome) 06/18/2017   Chronic venous insufficiency 12/31/2016   Lymphedema 12/31/2016   Pain in joint, ankle and foot 12/31/2016   Diabetes (HCC) 12/31/2016   GERD (gastroesophageal reflux disease) 12/31/2016   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   H/O retinal detachment 10/01/2016   No diabetic retinopathy in both eyes 10/01/2016   Cellulitis of right lower extremity 06/19/2016   Chronic joint pain 11/16/2015   Skin infection 05/15/2015   Depressed    Tick bite 10/20/2014   Primary insomnia 07/08/2014   Candidal balanitis 02/16/2014   Skin tag 01/17/2014   Left shoulder pain 10/04/2013   Anxiety 09/13/2013   Diarrhea 09/13/2013   Bleeding external hemorrhoids 09/03/2013   NAFLD (nonalcoholic fatty liver disease) 40/98/1191   Headache 07/05/2013   Abdominal pain 03/05/2013   Morbid (severe) obesity due to excess calories (HCC) 03/05/2013   Suicidal ideation 02/09/2013   Obstructive sleep apnea (adult) (pediatric) 07/21/2012   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    Past Surgical History:  Procedure Laterality Date   COLONOSCOPY     ESOPHAGOGASTRODUODENOSCOPY ENDOSCOPY     EYE SURGERY     LASER ABLATION INCOMPETENT VEIN INI         Home Medications    Prior to Admission medications  Medication Sig Start Date End Date Taking? Authorizing Provider  Adalimumab 40 MG/0.4ML PNKT Inject into the skin. 09/10/18  Yes [provider]  AIMOVIG 70 MG/ML SOAJ  07/21/17  Yes [provider]  albuterol (VENTOLIN HFA) 108 (90 Base) MCG/ACT inhaler Inhale 1-2 puffs into the lungs every 6 (six) hours as needed for wheezing or shortness of breath. 04/06/20  Yes Becky Augusta, NP  allopurinol (ZYLOPRIM) 300 MG tablet Take 1 tablet by mouth daily. 07/08/23 07/07/24 Yes [provider]  ALPRAZolam Prudy Feeler) 0.5 MG  tablet Take 0.5 mg by mouth at bedtime as needed for anxiety.   Yes [provider]  anastrozole (ARIMIDEX) 1 MG tablet Take by mouth. 06/18/22  Yes [provider]  atorvastatin (LIPITOR) 20 MG tablet Take 20 mg by mouth daily.   Yes [provider]  busPIRone (BUSPAR) 10 MG tablet Take by mouth. 12/22/17  Yes [provider]  clindamycin (CLEOCIN T) 1 % lotion Apply topically. 07/24/23 07/23/24 Yes [provider]  clindamycin (CLEOCIN) 300 MG capsule Take 1 capsule (300 mg total) by mouth 3 (three) times daily for 7 days. 08/28/23 09/04/23 Yes Shirlee Latch, PA-C  clotrimazole-betamethasone (LOTRISONE) cream Apply 1 Application topically daily. 08/12/22  Yes McDonald, Rachelle Hora, DPM  Colchicine 0.6 MG CAPS Take 1.2 mg on Day 1 of flare, followed by 0.6 mg one hour later. Day 2 take 0.6 mg BID until flare resolves. 08/01/17  Yes [provider]  cyclobenzaprine (FLEXERIL) 10 MG tablet Take 1 tablet (10 mg total) by mouth 3 (three) times daily as needed. 01/31/18  Yes Joni Reining, PA-C  dextroamphetamine (DEXTROSTAT) 10 MG tablet Take 20 mg by mouth 2 (two) times daily.   Yes [provider]  dicyclomine (BENTYL) 10 MG capsule Take 10 mg by mouth 3 (three) times daily before meals.   Yes [provider]  diphenoxylate-atropine (LOMOTIL) 2.5-0.025 MG tablet Take by mouth. 01/05/18  Yes [provider]  DULoxetine (CYMBALTA) 60 MG capsule Take 120 mg by mouth daily.    Yes [provider]  famotidine (PEPCID) 20 MG tablet Take by mouth. 10/15/18 08/13/24 Yes [provider]  fluticasone (FLONASE) 50 MCG/ACT nasal spray Place 1 spray into both nostrils 2 (two) times daily. 01/03/21  Yes [provider]  glipiZIDE (GLUCOTROL) 10 MG tablet Take 10 mg by mouth daily before breakfast.   Yes [provider]  glucose blood test strip Use 1 strip 3 times a day with meter 06/26/15  Yes [provider]   HUMALOG 100 UNIT/ML injection SMARTSIG:120 Unit(s) SUB-Q Daily 03/01/20  Yes [provider]  hydrocortisone (ANUSOL-HC) 2.5 % rectal cream Place rectally. 12/16/16  Yes [provider]  hydrOXYzine (ATARAX/VISTARIL) 25 MG tablet Take 25 mg by mouth 4 (four) times daily as needed. 11/17/20  Yes [provider]  Insulin Syringe-Needle U-100 31G X 5/16" 1 ML MISC Use as directed with the Lantus Insulin nightly 08/26/17  Yes [provider]  LANTUS 100 UNIT/ML injection Inject into the skin.   Yes [provider]  levothyroxine (SYNTHROID) 50 MCG tablet Take by mouth. 10/05/18 08/13/24 Yes [provider]  loperamide (IMODIUM) 2 MG capsule Take by mouth.   Yes [provider]  losartan (COZAAR) 50 MG tablet Take 1 tablet by mouth daily. 07/17/23 07/16/24 Yes [provider]  montelukast (SINGULAIR) 10 MG tablet Take by mouth. 03/31/17 08/13/24 Yes [provider]  montelukast (SINGULAIR) 10 MG tablet Take 1  tablet by mouth daily.   Yes [provider]  ondansetron (ZOFRAN) 4 MG tablet Take by mouth. 12/13/22 12/11/53 Yes [provider]  ondansetron (ZOFRAN-ODT) 4 MG disintegrating tablet Take 4 mg by mouth every 8 (eight) hours as needed. 01/09/21  Yes [provider]  oxybutynin (DITROPAN) 5 MG tablet Take 1 tablet by mouth 2 (two) times daily. 06/06/21  Yes [provider]  pantoprazole (PROTONIX) 40 MG tablet Take 40 mg by mouth daily.   Yes [provider]  polyethylene glycol powder (GLYCOLAX/MIRALAX) powder take 17GM (DISSOLVED IN WATER) by mouth once daily 07/25/17  Yes [provider]  PROAIR HFA 108 (90 Base) MCG/ACT inhaler 1-2 puffs every 4 (four) hours as needed. Last used: [id number] 07/21/17  Yes [provider]  promethazine (PHENERGAN) 25 MG tablet Take 25-50 mg by mouth at bedtime as needed. 01/09/21  Yes [provider]  propranolol (INNOPRAN XL) 120 MG 24  hr capsule Take 120 mg by mouth at bedtime.   Yes [provider]  propranolol ER (INDERAL LA) 120 MG 24 hr capsule Take 1 capsule by mouth daily. 12/22/17  Yes [provider]  ramelteon (ROZEREM) 8 MG tablet Take by mouth. 05/25/21  Yes [provider]  sodium fluoride (FLUORISHIELD) 1.1 % GEL dental gel  01/29/21  Yes [provider]  tadalafil (CIALIS) 5 MG tablet Take by mouth. 02/14/22  Yes [provider]  testosterone cypionate (DEPOTESTOSTERONE CYPIONATE) 200 MG/ML injection Inject into the muscle. 02/14/22  Yes [provider]  topiramate (TOPAMAX) 50 MG tablet Take 50 mg by mouth 2 (two) times daily.   Yes [provider]  TRULICITY 1.5 MG/0.5ML SOAJ Inject into the skin. 08/08/23  Yes [provider]  WELLBUTRIN XL 150 MG 24 hr tablet Take 1 tablet by mouth every morning. 04/18/22  Yes [provider]  azelastine (ASTELIN) 0.1 % nasal spray Place into the nose.    [provider]  hydrochlorothiazide (HYDRODIURIL) 12.5 MG tablet Take 12.5 mg by mouth daily. 02/05/21   [provider]  ketoconazole (NIZORAL) 2 % cream Apply topically daily. 01/10/21   [provider]  levothyroxine (SYNTHROID) 50 MCG tablet Take 1 tablet by mouth daily. 02/17/22 08/13/24  [provider]  lidocaine (XYLOCAINE) 2 % solution Use as directed 15 mLs in the mouth or throat as needed for mouth pain. 06/14/21   Mickie Bail, NP  miconazole (MICOTIN) 2 % cream Apply 1 Application topically 2 (two) times daily. 04/10/23   Domenick Gong, MD  Mirtazapine (REMERON PO)  10/23/21   [provider]  oxybutynin (DITROPAN) 5 MG tablet Take 5 mg by mouth 2 (two) times daily. 01/09/21   [provider]  ramelteon (ROZEREM) 8 MG tablet Take 8 mg by mouth at bedtime. 06/22/21   [provider]    Family History Family History  Problem Relation Age of Onset   Diabetes Mother    Depression  Mother    Diabetes Sister    Diabetes Brother    Diabetes Maternal Uncle    Diabetes Maternal Grandmother    Heart disease Maternal Grandmother    Cancer Maternal Grandfather    COPD Paternal Grandmother    Cancer Paternal Grandfather    Varicose Veins Paternal Grandfather    Healthy Father     Social History Social History   Tobacco Use   Smoking status: Never   Smokeless tobacco: Never  Vaping Use   Vaping status: Never  Used  Substance Use Topics   Alcohol use: No   Drug use: No     Allergies   Penicillins, Sulfa antibiotics, Erythromycin, and Metronidazole   Review of Systems Review of Systems  Constitutional:  Negative for fatigue.  Respiratory:  Negative for shortness of breath and wheezing.   Cardiovascular:  Negative for chest pain and palpitations.  Gastrointestinal:  Negative for nausea and vomiting.  Skin:  Positive for rash.  Neurological:  Negative for dizziness, weakness and headaches.     Physical Exam Triage Vital Signs ED Triage Vitals  Encounter Vitals Group     BP 08/28/23 1158 119/82     Systolic BP Percentile --      Diastolic BP Percentile --      Pulse Rate 08/28/23 1158 69     Resp --      Temp 08/28/23 1158 98.5 F (36.9 C)     Temp Source 08/28/23 1158 Oral     SpO2 08/28/23 1158 95 %     Weight 08/28/23 1155 (!) 479 lb (217.3 kg)     Height 08/28/23 1155 5\' 11"  (1.803 m)     Head Circumference --      Peak Flow --      Pain Score 08/28/23 1155 0     Pain Loc --      Pain Education --      Exclude from Growth Chart --    No data found.  Updated Vital Signs BP 119/82 (BP Location: Left Wrist)   Pulse 69   Temp 98.5 F (36.9 C) (Oral)   Ht 5\' 11"  (1.803 m)   Wt (!) 479 lb (217.3 kg)   SpO2 95%   BMI 66.81 kg/m    Physical Exam Vitals and nursing note reviewed.  Constitutional:      General: He is not in acute distress.    Appearance: Normal appearance. He is well-developed. He is obese. He is not ill-appearing.   HENT:     Head: Normocephalic and atraumatic.     Nose: Nose normal.     Mouth/Throat:     Mouth: Mucous membranes are moist.     Pharynx: Oropharynx is clear.     Comments: No intraoral swelling Eyes:     Conjunctiva/sclera: Conjunctivae normal.  Cardiovascular:     Rate and Rhythm: Normal rate and regular rhythm.     Heart sounds: Normal heart sounds.  Pulmonary:     Effort: Pulmonary effort is normal. No respiratory distress.     Breath sounds: Normal breath sounds.  Musculoskeletal:     Cervical back: Neck supple.  Skin:    General: Skin is warm and dry.     Capillary Refill: Capillary refill takes less than 2 seconds.     Findings: Rash (Diffuse urticarial rash) present.     Comments: Difficult to evaluate a chest flare.  Patient has diffuse hives rash which does have redness in the groin.  Unsure if the erythema in the groin is related to the hives rash or at bedtime flare.  No abscesses or drainage noted.  Neurological:     General: No focal deficit present.     Mental Status: He is alert. Mental status is at baseline.     Motor: No weakness.     Gait: Gait normal.  Psychiatric:        Mood and Affect: Mood normal.        Behavior: Behavior normal.      UC  Treatments / Results  Labs (all labs ordered are listed, but only abnormal results are displayed) Labs Reviewed - No data to display  EKG   Radiology No results found.  Procedures Procedures (including critical care time)  Medications Ordered in UC Medications - No data to display  Initial Impression / Assessment and Plan / UC Course  I have reviewed the triage vital signs and the nursing notes.  Pertinent labs & imaging results that were available during my care of the patient were reviewed by me and considered in my medical decision making (see chart for details).   43 year old male with history of hidradenitis suppurativa, obesity, diabetes, asthma, gout presents for onset of urticarial rash  today.  Denies associated swelling, difficulty swallowing or difficulty breathing.  Patient reports starting doxycycline and prednisone this week for a chest flare.  No other new medications.  Has taken 50 mg of Benadryl at home and it has helped somewhat.  Vitals are stable and normal.  He is overall well-appearing.  No acute distress.  On exam he has diffuse urticarial rash on abdomen, back and thighs.  No intraoral swelling.  Speaking normally.  Chest clear.  Heart regular rate and rhythm.  Difficult to evaluate groin for a chest flare as he has urticarial rash in this region.  No obvious abscesses or drainage.  Advised patient urticarial rash could be due to doxycycline.  I want him to discontinue that at this time and begin clindamycin.  Also continue Benadryl and start famotidine.  Cool compresses.  Continue prednisone.  Advised him to contact his dermatologist and ask if they want him on a different medication regimen.  Advised to monitor closely.  Advised for any acute worsening of symptoms or no improvement in 24 to 48 hours to go to the ER.  Patient is agreeable.   Final Clinical Impressions(s) / UC Diagnoses   Final diagnoses:  Urticaria  Hydradenitis     Discharge Instructions      -Hives could be allergic reaction doxycycline.  Discontinue use of this medication and start the clindamycin. - Contact your dermatologist to make sure it is okay to take this medication and they do not want you to take something else. - Take 50 mg of Benadryl every 4-6 hours until rash resolves.  Also purchase over-the-counter famotidine/Pepcid and take that daily.  Continue prednisone. - Monitor symptoms closely and if you have any associated swelling or breathing difficulty please go to the ER.     ED Prescriptions     Medication Sig Dispense Auth. Provider   clindamycin (CLEOCIN) 300 MG capsule Take 1 capsule (300 mg total) by mouth 3 (three) times daily for 7 days. 21 capsule Shirlee Latch,  PA-C      PDMP not reviewed this encounter.   Shirlee Latch, PA-C 08/28/23 1255

## 2023-08-28 NOTE — Discharge Instructions (Signed)
-  Hives could be allergic reaction doxycycline.  Discontinue use of this medication and start the clindamycin. - Contact your dermatologist to make sure it is okay to take this medication and they do not want you to take something else. - Take 50 mg of Benadryl every 4-6 hours until rash resolves.  Also purchase over-the-counter famotidine/Pepcid and take that daily.  Continue prednisone. - Monitor symptoms closely and if you have any associated swelling or breathing difficulty please go to the ER.

## 2023-08-28 NOTE — ED Triage Notes (Signed)
 Pt c/o body rash x1day  Pt states that he had his hair bleached yesterday and did not rinse his hair before showering.  Pt is currently taking doxycycline and prednisone.

## 2023-08-28 NOTE — Unmapped (Signed)
 03/26 at 9:00 am  Patient was unable to accept a sooner appointment     Thanks  Toni Amend

## 2023-08-30 DIAGNOSIS — G4733 Obstructive sleep apnea (adult) (pediatric): Principal | ICD-10-CM

## 2023-08-30 MED ORDER — FLUTICASONE PROPIONATE 50 MCG/ACTUATION NASAL SPRAY,SUSPENSION
Freq: Two times a day (BID) | NASAL | 5 refills | 60 days
Start: 2023-08-30 — End: 2024-08-29

## 2023-09-01 MED ORDER — FLUTICASONE PROPIONATE 50 MCG/ACTUATION NASAL SPRAY,SUSPENSION
Freq: Two times a day (BID) | NASAL | 11 refills | 60 days | Status: CP
Start: 2023-09-01 — End: 2024-08-31

## 2023-09-01 MED ORDER — BUDESONIDE-FORMOTEROL HFA 80 MCG-4.5 MCG/ACTUATION AEROSOL INHALER
Freq: Two times a day (BID) | RESPIRATORY_TRACT | 0 refills | 31 days | Status: CP
Start: 2023-09-01 — End: 2024-08-31

## 2023-09-02 DIAGNOSIS — E1165 Type 2 diabetes mellitus with hyperglycemia: Principal | ICD-10-CM

## 2023-09-02 DIAGNOSIS — G43909 Migraine, unspecified, not intractable, without status migrainosus: Principal | ICD-10-CM

## 2023-09-02 DIAGNOSIS — Z794 Long term (current) use of insulin: Principal | ICD-10-CM

## 2023-09-02 MED ORDER — GLIPIZIDE 10 MG TABLET
ORAL_TABLET | Freq: Two times a day (BID) | ORAL | 3 refills | 90 days | Status: CP
Start: 2023-09-02 — End: 2024-08-31

## 2023-09-02 MED ORDER — AIMOVIG AUTOINJECTOR 140 MG/ML SUBCUTANEOUS AUTO-INJECTOR
SUBCUTANEOUS | 3 refills | 0 days
Start: 2023-09-02 — End: 2024-09-01

## 2023-09-02 NOTE — Unmapped (Signed)
 Patient is requesting the following refill  Requested Prescriptions     Pending Prescriptions Disp Refills    erenumab-aooe (AIMOVIG AUTOINJECTOR) auto-injector [Pharmacy Med Name: AIMOVIG 140 MG/ML AUTOINJECTOR] 3 mL 3     Sig: Inject 1 Pen under the skin every thirty (30) days.       Recent Visits  Date Type Provider Dept   08/08/23 Office Visit Loran Senters, FNP [place] [facility] [address] [facility]   07/08/23 Office Visit Loran Senters, FNP [place] [facility] [address] [facility]   06/25/23 Office Visit Loran Senters, FNP [place] [facility] [address] [facility]   04/23/23 Office Visit Loran Senters, FNP [place] [facility] [address] [facility]   03/05/23 Office Visit Loran Senters, FNP [place] [facility] [address] [facility]   11/26/22 Office Visit Loran Senters, FNP [place] [facility] [address] [facility]   Showing recent visits within past 365 days and meeting all other requirements  Future Appointments  Date Type Provider Dept   10/08/23 Appointment Loran Senters, FNP  [facility] [address] [facility]   Showing future appointments within next 365 days and meeting all other requirements       Labs: Not applicable this refill

## 2023-09-03 ENCOUNTER — Ambulatory Visit: Admit: 2023-09-03 | Discharge: 2023-09-04 | Payer: MEDICARE | Attending: Internal Medicine | Primary: Internal Medicine

## 2023-09-03 DIAGNOSIS — L732 Hidradenitis suppurativa: Principal | ICD-10-CM

## 2023-09-03 DIAGNOSIS — Z79899 Other long term (current) drug therapy: Principal | ICD-10-CM

## 2023-09-03 MED ORDER — AIMOVIG AUTOINJECTOR 140 MG/ML SUBCUTANEOUS AUTO-INJECTOR
SUBCUTANEOUS | 3 refills | 0 days | Status: CP
Start: 2023-09-03 — End: 2024-09-02

## 2023-09-03 MED ADMIN — triamcinolone acetonide (KENALOG-40) injection 40 mg: 40 mg | INTRA_ARTICULAR | @ 14:00:00 | Stop: 2023-09-03

## 2023-09-03 NOTE — Unmapped (Signed)
 Dermatology Note     Assessment and Plan:      Hidradenitis suppurativa, Hurley stage II, chronic, well controlled  - Diagnosis, treatment options, prognosis, risk/ benefit, and side effects of treatment were discussed with the patient.   - s/p unroofing of right scrotum 2020  - Doing well on Humira 80 mg weekly with flares every few months and they are manageable. Jointly agreed to continue medication as below  - Continue humira 80mg  weekly   - Continue clindamycin 1% lotion topically nightly to the flaring lesions  - Stop oral clindamycin  - Continue prednisone taper as prescribed  - Joint decision to treat with intra lesional kenalog today as below  - Recommend hold doxycycline for now; recommend re-evaluate before re-starting whether rash may have been related (less likely as patient has tolerated this medication well previously)    Intralesional Kenalog Procedure Note: After the patient was informed of risks (including atrophy and dyspigmentation), benefits and side effects of intralesional steroid injection, the patient elected to undergo injection and verbal consent was obtained. Skin was cleaned with alcohol and injected intralesionally into the sites (below). The patient tolerated the procedure well without complications and was instructed on post-procedure care.  Location(s): mons pubis  Number of sites treated: 1  Kenalog (triamcinolone) Concentration: 40 mg/ml   Volume: 0.2 ml total     High Risk Medication Use (Humira)  - Quant gold neg 07/2022. Reordered today.  - Hep serologies acceptable from 2019    The patient was advised to call for an appointment should any new, changing, or symptomatic lesions develop.     RTC: Return in about 6 months (around 03/05/2024) for or sooner if continued flares. or sooner as needed   _________________________________________________________________      Chief Complaint     Chief Complaint   Patient presents with    Follow-up     HS - it started flare from 1 month. HPI     Logan Quinn. is a 43 y.o. male who presents as a returning patient (last seen 07/24/2023) to Dermatology for  hidradenitis suppurativa flare.     At last visit, continued humira and clindamycin.    Today:  - notes on humira ~2 years, normally well controlled but has had flare over last 2 months  - notes was put on prednisone and doxycycline  - had itchy rash ~10 days after starting doxycycline, had also used new hair bleach and reaction happened that night/morning so unclear etiology  - had switched to clindamycin but endorses diarrhea    The patient denies any other new or changing lesions or areas of concern.     Pertinent Past Medical History     Problem List       Hidradenitis suppurativa - Primary    Relevant Medications    triamcinolone acetonide (KENALOG-40) injection 40 mg (Completed) (Start on 09/03/2023 10:00 AM)     Past Medical History, Family History, Social History, Medication List, Allergies, and Problem List were reviewed in the rooming section of Epic.     ROS: Other than symptoms mentioned in the HPI, no fevers, chills, or other skin complaints    Physical Examination     GENERAL: Well-appearing male in no acute distress, resting comfortably.  NEURO: Alert and oriented, answers questions appropriately  PSYCH: Normal mood and affect  RESP: No increased work of breathing  SKIN (Focal Skin Exam): Per patient request, examination of groin was performed  - inflammatory nodules on mons  pubis    All areas not commented on are within normal limits or unremarkable      (Approved Template 02/21/2020)

## 2023-09-05 NOTE — Unmapped (Signed)
I saw and evaluated the patient, participating in the key elements of the service.  I discussed the findings, assessment and plan with the Resident and agree with the Resident’s findings and plan as documented in the Resident’s note.  I was present for the entirety of procedures taking less than 5 minutes and was present for the key and critical portions and immediately available for the entirety of procedure(s) taking 5 or more minutes.   Selso Mannor, MD/MPH

## 2023-09-10 ENCOUNTER — Encounter: Payer: Self-pay | Admitting: Podiatry

## 2023-09-10 ENCOUNTER — Ambulatory Visit (INDEPENDENT_AMBULATORY_CARE_PROVIDER_SITE_OTHER): Admitting: Podiatry

## 2023-09-10 DIAGNOSIS — M722 Plantar fascial fibromatosis: Secondary | ICD-10-CM

## 2023-09-10 MED ORDER — DICLOFENAC SODIUM 75 MG PO TBEC: 75.0000 mg | DELAYED_RELEASE_TABLET | Freq: Two times a day (BID) | ORAL | 0 refills | Status: AC

## 2023-09-10 NOTE — Progress Notes (Signed)
  Subjective:  Patient ID: Jason Davidson, male    DOB: August 22, 1980,  MRN: [id number]  Chief Complaint  Patient presents with   Plantar Fasciitis    "It's hurting."    43 y.o. male presents with the above complaint. History confirmed with patient.  He has been dealing with heel pain for some time and the injection he had at his last visit with Dr. Al Corpus was helpful, has worn off the meloxicam did not help much, brace was helping as well as it broke recently and the strap was not working more  Objective:  Physical Exam: warm, good capillary refill, no trophic changes or ulcerative lesions, normal DP and PT pulses, normal sensory exam, and pain on palpation of plantar medial heel at insertion of plantar fascia.   Radiographs: Previous x-rays taken December 24 showed no stress fracture or heel spur Assessment:   1. Plantar fasciitis of left foot      Plan:  Patient was evaluated and treated and all questions answered.  Discussed the etiology and treatment options for plantar fasciitis including stretching, formal physical therapy, supportive shoegears such as a running shoe or sneaker, pre fabricated orthoses, injection therapy, and oral medications. We also discussed the role of surgical treatment of this for patients who do not improve after exhausting non-surgical treatment options.   -Educated patient on stretching and icing of the affected limb -Plantar fascial brace dispensed again his previous brace failed.  Replacement was given today -Injection delivered to the plantar fascia of the left foot. -Rx for diclofenac. Educated on use, risks and benefits of the medication  After sterile prep with povidone-iodine solution and alcohol, the left heel was injected with 0.5cc 2% xylocaine plain, 0.5cc 0.5% marcaine plain, 10mg  triamcinolone acetonide, and 4mg  dexamethasone was injected along the medial plantar fascia at the insertion on the plantar calcaneus. The patient tolerated  the procedure well without complication.  Return if symptoms worsen or fail to improve.

## 2023-09-11 DIAGNOSIS — R309 Painful micturition, unspecified: Principal | ICD-10-CM

## 2023-09-11 DIAGNOSIS — R369 Urethral discharge, unspecified: Principal | ICD-10-CM

## 2023-09-11 MED ORDER — FLUCONAZOLE 150 MG TABLET
ORAL_TABLET | Freq: Every day | ORAL | 0 refills | 2 days
Start: 2023-09-11 — End: 2023-09-11

## 2023-09-11 NOTE — Unmapped (Signed)
 Private area x 1 day.  Using OTC Monistat without relief.  Gets irritated and he scratches now getting the little cuts.

## 2023-09-12 NOTE — Unmapped (Signed)
 Patient accepted 04/10 in [facility]

## 2023-09-15 NOTE — Unmapped (Signed)
 Reviewed Dexcom report. Blood sugar consistently high. If this continues to be the trend we will need to make some adjustment to your medication.

## 2023-09-16 NOTE — Unmapped (Signed)
 Dermatology Note     Assessment and Plan:      Hidradenitis suppurativa, Hurley stage II, chronic,flaring  Previously tried: doxycyline, humira 80 mg weekly, prednisone, intra lesional kenalog   - Diagnosis, treatment options, prognosis, risk/ benefit, and side effects of treatment were discussed with the patient.   - s/p unroofing of right scrotum 2020  - Start secukinumab 300 mg/2 mL PnIj; Inject the contents of 1 pen (300 mg) under the skin once weekly for 5 weeks (on weeks 0,1,2,3,4) for loading dose then every 28 days   - Continue humira 80mg  weekly until cosentyx is approved   - Continue clindamycin 1% lotion topically nightly to the flaring lesions  - intra lesional kenalog as below     Intralesional Kenalog Procedure Note: After the patient was informed of risks (including atrophy and dyspigmentation), benefits and side effects of intralesional steroid injection, the patient elected to undergo injection and verbal consent was obtained. Skin was cleaned with alcohol and injected intralesionally into the sites (below). The patient tolerated the procedure well without complications and was instructed on post-procedure care.  Location(s): right mons pubis  Number of sites treated: 1  Kenalog (triamcinolone) Concentration: 40 mg/ml   Volume: 0.4 ml total       High Risk Medication Use (Humira)  - No history of IBD  - Quant gold neg 07/2023  - Hep serologies acceptable from 2019    The patient was advised to call for an appointment should any new, changing, or symptomatic lesions develop.     RTC: Return in about 4 months (around 01/18/2024). or sooner as needed   _________________________________________________________________      Chief Complaint     Chief Complaint   Patient presents with    Hidradenitis suppurativa     Pt coming in due to HS flares  Concern about flares on the groin        HPI     Logan Quinn. is a 43 y.o. male who presents as a returning patient (last seen 09/03/2023) to Dermatology for  hidradenitis suppurativa flare. Patient reports continued flares of the groin. The areas that were injected at his last visit did improve with kenalog. However, he continues to have new active areas. He notes some correlation between flares and long pants. He also is concerned these may worsen over the summer. Currently, he has continued on the Humira and has started a new antibiotic (macrobid) by his PCP.     The patient denies any other new or changing lesions or areas of concern.     Pertinent Past Medical History     Problem List       Hidradenitis suppurativa - Primary    Relevant Medications    secukinumab (COSENTYX UNOREADY PEN) 300 mg/2 mL PnIj    secukinumab (COSENTYX UNOREADY PEN) 300 mg/2 mL PnIj       Past Medical History, Family History, Social History, Medication List, Allergies, and Problem List were reviewed in the rooming section of Epic.     ROS: Other than symptoms mentioned in the HPI, no fevers, chills, or other skin complaints    Physical Examination     GENERAL: Well-appearing male in no acute distress, resting comfortably.  NEURO: Alert and oriented, answers questions appropriately  PSYCH: Normal mood and affect  RESP: No increased work of breathing  SKIN (Focal Skin Exam): Per patient request, examination of groin was performed  - inflammatory nodules on mons pubis  - 1 draining  sinus of the mons pubis    All areas not commented on are within normal limits or unremarkable      (Approved Template 02/21/2020)

## 2023-09-16 NOTE — Unmapped (Signed)
 [facility] Refill Coordination Note    Logan Quinn., DOB: 03-26-1981  Phone: There are no phone numbers on file.      All above HIPAA information was verified with patient.         09/16/2023     8:08 AM   Specialty Rx Medication Refill Questionnaire   Which Medications would you like refilled and shipped? Humira   Please list all current allergies: Penicillin erythromycin sulfa drugs   Have you missed any doses in the last 30 days? No   Have you had any changes to your medication(s) since your last refill? No   How many days remaining of each medication do you have at home? 1   If receiving an injectable medication, next injection date is 09/18/2023   Have you experienced any side effects in the last 30 days? No   Please enter the full address (street address, city, state, zip code) where you would like your medication(s) to be delivered to. [address], [place] [address]   Please specify on which day you would like your medication(s) to arrive. Note: if you need your medication(s) within 3 days, please call the pharmacy to schedule your order at [phone]  09/23/2023   Has your insurance changed since your last refill? No   Would you like a pharmacist to call you to discuss your medication(s)? No   Do you require a signature for your package? (Note: if we are billing Medicare Part B or your order contains a controlled substance, we will require a signature) No   I have been provided my out of pocket cost for my medication and approve the pharmacy to charge the amount to my credit card on file. No   Additional Information Confirm         Completed refill call assessment today to schedule patient's medication shipment from the [facility] [phone]).  All relevant notes have been reviewed.       Confirmed patient received a Conservation officer, historic buildings and a Surveyor, mining with first shipment. The patient will receive a drug information handout for each medication shipped and additional FDA Medication Guides as required.         REFERRAL TO PHARMACIST     Referral to the pharmacist: Not needed      [facility]     Shipping address confirmed in Epic.     Delivery Scheduled: Yes, Expected medication delivery date: 4/15.     Medication will be delivered via UPS to the prescription address in Epic WAM.    [facility] Specialty Technician

## 2023-09-17 ENCOUNTER — Ambulatory Visit: Attending: Podiatry | Admitting: Physical Therapy

## 2023-09-17 ENCOUNTER — Ambulatory Visit: Admit: 2023-09-17 | Discharge: 2023-09-18 | Attending: Family | Primary: Family

## 2023-09-17 DIAGNOSIS — R309 Painful micturition, unspecified: Principal | ICD-10-CM

## 2023-09-17 DIAGNOSIS — R399 Unspecified symptoms and signs involving the genitourinary system: Principal | ICD-10-CM

## 2023-09-17 DIAGNOSIS — M6281 Muscle weakness (generalized): Secondary | ICD-10-CM | POA: Insufficient documentation

## 2023-09-17 DIAGNOSIS — R269 Unspecified abnormalities of gait and mobility: Secondary | ICD-10-CM | POA: Diagnosis present

## 2023-09-17 DIAGNOSIS — M79672 Pain in left foot: Secondary | ICD-10-CM | POA: Diagnosis present

## 2023-09-17 DIAGNOSIS — M25672 Stiffness of left ankle, not elsewhere classified: Secondary | ICD-10-CM | POA: Diagnosis present

## 2023-09-17 DIAGNOSIS — M722 Plantar fascial fibromatosis: Secondary | ICD-10-CM | POA: Diagnosis not present

## 2023-09-17 MED ORDER — NITROFURANTOIN MONOHYDRATE/MACROCRYSTALS 100 MG CAPSULE
ORAL_CAPSULE | Freq: Two times a day (BID) | ORAL | 0 refills | 10.00 days | Status: CP
Start: 2023-09-17 — End: ?

## 2023-09-17 MED ORDER — FLUCONAZOLE 150 MG TABLET
ORAL_TABLET | 0 refills | 0.00 days | Status: CP
Start: 2023-09-17 — End: ?

## 2023-09-17 NOTE — Unmapped (Signed)
 Assessment/Plan:      Logan Quinn was seen today for dysuria, urinary frequency and urinary urgency.    Diagnoses and all orders for this visit:    Urinary symptom or sign  -     POCT urinalysis dipstick  -     nitrofurantoin , macrocrystal-monohydrate, (MACROBID ) 100 MG capsule; Take 1 capsule (100 mg total) by mouth every twelve (12) hours.    Painful urination  -     Chlamydia/Gonorrhoeae NAA; Future  -     TRICHOMONAS NAA  -     Chlamydia/Gonorrhoeae NAA  -     Urine Culture; Future  -     Urine Culture  -     nitrofurantoin , macrocrystal-monohydrate, (MACROBID ) 100 MG capsule; Take 1 capsule (100 mg total) by mouth every twelve (12) hours.  -     fluconazole  (DIFLUCAN ) 150 MG tablet; Take 150 mg now and repeat 150 mg in 5-7 days.          Diagnosis and plan along with any newly prescribed medication(s) were discussed in detail with this patient today.  The patient verbalized understanding and agreed with the plan without language barriers or behavioral barriers to understanding unless otherwise noted.    Subjective:      HPI: Logan Furio. is a 43 y.o. male is here for    Chief Complaint   Patient presents with    Dysuria     Dysuria x 1 week.  Afebrile.    Urinary Frequency    Urinary Urgency       Patient presents with a 1 week  history of dysuria, frequency, and urgency.  Urine appears normal, with normal odor.  He also notes  no other symptoms .  Symptoms are generally worse inconsistently.  For relief, he has tried fluconazole  x 1 dose for genital yeast. External genital symptoms resolve with dose of fluconazole .       Answers submitted by the patient for this visit:  Painful Urination Questionnaire (Submitted on 09/15/2023)  Chief Complaint: Dysuria  Chronicity: recurrent  Onset: in the past 7 days  Frequency: every urination  Progression since onset: gradually improving  Pain quality: burning, stabbing  Pain severity: moderate  Pain - numeric: 7/10  Fever: no fever  Sexually active?: Yes  History of pyelonephritis?: No  hematuria: No  chills: No  hesitancy: No  discharge: No  frequency: Yes  nausea: No  flank pain: No  possible pregnancy: No  urgency: Yes  sweats: No  vomiting: No        BS still 300-350  Fasting this AM 283  Via Dexcom.   Average glucose 289.   A1c 10.2 average via CGM.     Drinking zero sugar sodas, 2 per day.     [facility] advised him to stop doxycycline  (for HS) as it was causing the hives. March 20th at [facility].   Advised benadryl TID, took for several days, and famotidine  BID.   Bleached hair around the same time. Discontinued normal brand, used in shower and it washed down his body.   Stress related believed to be a factor.  Had been on coxy a week without hives.   Dermatology told him they didn't think that was the cause. Has dermatology follow up tomorrow. Discussion of changing biological agents.   Stopped doxy--> fairly immediate after UC visit.   Started clindamycin , SE of diarrhea.   On ABX currently, on prednisone  (elevated BS).     Steroid injection on  heel on 09/10/23.     Has dental caries, going to dentist for dental work later this morning.      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    Liver disease     Migraines     Mild intermittent asthma without complication 10/11/2021    Morbid obesity with BMI of 60.0-69.9, adult (CMS-HCC)     Neuropathy in diabetes     Obstructive sleep apnea     OSA on CPAP     Retinal detachment 04/2014    Severe obstructive sleep apnea     Trapezius muscle strain 12/07/2013    Urinary incontinence, nocturnal enuresis     Venous insufficiency      Past Surgical History:   Procedure Laterality Date    COLONOSCOPY CYSTOSCOPY      EYE SURGERY  04/2014    LASER ABLATION INCOMPETENT VEIN [facility] HISTORICAL RESULT) Right     PR COLONOSCOPY FLX DX W/COLLJ SPEC WHEN PFRMD N/A 03/24/2013    Procedure: COLONOSCOPY, FLEXIBLE, PROXIMAL TO SPLENIC FLEXURE; DIAGNOSTIC, W/WO COLLECTION SPECIMEN BY BRUSH OR WASH;  Surgeon: Beatriz Bouillon, MD;  [facility];  Service: Gastroenterology    PR EYE SURG POST SGMT PROC UNLISTED Left     pneumatic retinopexy OS    PR UPPER GI ENDOSCOPY,DIAGNOSIS N/A 02/02/2013    Procedure: UGI ENDO, INCLUDE ESOPHAGUS, STOMACH, & DUODENUM &/OR JEJUNUM; DX W/WO COLLECTION SPECIMN, BY BRUSH OR WASH;  Surgeon: Aloha Jakes, MD;  Location: [facility];  Service: Gastroenterology    SKIN BIOPSY      UPPER GASTROINTESTINAL ENDOSCOPY      US  PYLORIC STENOSIS ([place] HISTORICAL RESULT)         Family History:     Family History   Problem Relation Age of Onset    Cancer Maternal Grandfather         Stomach Cancer    Hearing loss Maternal Grandfather     GER disease Maternal Grandfather     Cancer Paternal Grandfather         Bone Cancer, Lung Cancer    COPD Paternal Grandmother     Arthritis Paternal Grandmother     Depression Paternal Grandmother     Osteoporosis Paternal Grandmother     Diabetes Mother     Heart disease Mother     Migraines Mother     Arthritis Mother     Depression Mother     GER disease Mother     Hypertension Mother     Angina Mother     COPD Mother     Glaucoma Mother     Hearing loss Mother     Cataracts Mother     Stroke Mother     Miscarriages / [place] Mother     Diabetes Sister     Migraines Sister     Asthma Sister     Depression Sister     Hearing loss  Sister     Diabetes Brother     Asthma Brother     Diabetes Maternal Grandmother     Heart disease Maternal Grandmother     Migraines Maternal Grandmother     Depression Maternal Grandmother     Angina Maternal Grandmother     Hypertension Maternal Grandmother     Stroke Maternal Grandmother     GER disease Maternal Grandmother     Diabetes Maternal Uncle     Hearing loss Maternal Uncle     Diabetes Maternal Uncle         resulted in need for kidney transplant    Liver disease Maternal Uncle     Kidney disease Maternal Uncle         needed kidney transplant    Depression Maternal Uncle     Asthma Brother     Gout Father     No Known Problems Paternal Aunt     No Known Problems Paternal Uncle     No Known Problems Other     Diabetes Sister     Asthma Sister     Depression Sister     Hearing loss Sister     Migraines Sister     Diabetes Brother     Asthma Brother     Broken bones Brother     Diabetes Maternal Uncle     Hearing loss Maternal Uncle     Asthma Brother     Colorectal Cancer Neg Hx     Esophageal cancer Neg Hx     Liver cancer Neg Hx     Pancreatic cancer Neg Hx     Stomach cancer Neg Hx     Amblyopia Neg Hx     Blindness Neg Hx     Retinal detachment Neg Hx     Strabismus Neg Hx     Macular degeneration Neg Hx     Anesthesia problems Neg Hx     Clotting disorder Neg Hx     Collagen disease Neg Hx     Dislocations Neg Hx     Fibromyalgia Neg Hx     Hemophilia Neg Hx     Rheumatologic disease Neg Hx     Scoliosis Neg Hx     Severe sprains Neg Hx     Sickle cell anemia Neg Hx     Spinal Compression Fracture Neg Hx     Melanoma Neg Hx     Basal cell carcinoma Neg Hx     Squamous cell carcinoma Neg Hx     Deep vein thrombosis Neg Hx     Thyroid disease Neg Hx        Social History:     Social History     Socioeconomic History    Marital status: Married     Spouse name: None    Number of children: None    Years of education: None    Highest education level: None   Tobacco Use    Smoking status: Never     Passive exposure: Never    Smokeless tobacco: Never   Vaping Use    Vaping status: Never Used   Substance and Sexual Activity    Alcohol use: Never     Comment: rare social    Drug use: Never    Sexual activity: Yes     Partners: Female     Birth control/protection: None   Other Topics Concern    Do you use sunscreen? Yes  Tanning bed use? No    Are you easily burned? Yes    Excessive sun exposure? No    Blistering sunburns? No     Social Drivers of Psychologist, prison and probation services Strain: Low Risk  (07/02/2022)    Overall Financial Resource Strain (CARDIA)     Difficulty of Paying Living Expenses: Not hard at all   Food Insecurity: No Food Insecurity (09/17/2023)    Hunger Vital Sign     Worried About Running Out of Food in the Last Year: Never true     Ran Out of Food in the Last Year: Never true   Transportation Needs: No Transportation Needs (09/17/2023)    PRAPARE - Therapist, art (Medical): No     Lack of Transportation (Non-Medical): No    Received from [facility]    Social Network   Housing: Low Risk  (09/17/2023)    Housing     Within the past 12 months, have you ever stayed: outside, in a car, in a tent, in an overnight shelter, or temporarily in someone else's home (i.e. couch-surfing)?: No     Are you worried about losing your housing?: No       Allergies:     Erythromycin, Penicillins, Sulfa (sulfonamide antibiotics), Other, and Metronidazole    Current Medications:     Current Outpatient Medications   Medication Sig Dispense Refill    adalimumab (HUMIRA,CF, PEN) 80 mg/0.8 mL PnKt Inject the contents of 1 pen (80 mg) subcutaneously once every week 4 each 11    allopurinol (ZYLOPRIM) 300 MG tablet Take 1 tablet (300 mg total) by mouth daily. 90 tablet 3    ALPRAZolam (XANAX) 0.5 MG tablet Takes 1 tablet nightly and 1 tablet a day when needed      ascorbic acid (VITAMIN C ORAL) Take 1 capsule by mouth nightly.      atorvastatin (LIPITOR) 20 MG tablet Take 1 tablet (20 mg total) by mouth daily. 90 tablet 3    blood sugar diagnostic (ACCU-CHEK GUIDE TEST STRIPS) Strp by Other route Three (3) times a day before meals. 300 strip 3    blood-glucose meter kit Use as directed 1 each 0    budesonide-formoterol (SYMBICORT) 80-4.5 mcg/actuation inhaler Inhale 2 puffs two (2) times a day. 10.2 g 0    clindamycin (CLEOCIN T) 1 % lotion Apply topically two (2) times a day. To affected areas on body as needed for flares 60 mL 3    clobetasoL (TEMOVATE) 0.05 % ointment Apply daily to painful affected areas if needed for flares, then stop 15 g 1    cyclobenzaprine (FLEXERIL) 10 MG tablet Take 1 tablet (10 mg total) by mouth Three (3) times a day as needed for muscle spasms. 60 tablet 1    dextroamphetamine sulfate (DEXTROSTAT) 10 MG tablet Take 3 tablets (30 mg total) by mouth two (2) times a day.      diclofenac (VOLTAREN) 75 MG EC tablet Take 1 tablet (75 mg total) by mouth two (2) times a day.      dicyclomine (BENTYL) 10 mg capsule Take 1 capsule (10 mg total) by mouth Four (4) times a day (before meals and nightly). 120 capsule 1    diphenoxylate-atropine (LOMOTIL) 2.5-0.025 mg per tablet Take 1 tablet by mouth four (4) times a day as needed for diarrhea. 30 tablet 1    dulaglutide 1.5 mg/0.5 mL PnIj Inject 0.5 mL (1.5 mg  total) under the skin every seven (7) days. 2 mL 2    DULoxetine (CYMBALTA) 60 MG capsule Take 1 capsule (60 mg total) by mouth Two (2) times a day. 180 capsule 0    empty container Misc Use as directed to dispose of needles. When full, make sure lid is closed tightly then dispose of container in trash. 1 each 2    erenumab-aooe (AIMOVIG AUTOINJECTOR) auto-injector Inject 1 Pen under the skin every thirty (30) days. 3 mL 3    famotidine (PEPCID) 20 MG tablet Take 1 tablet (20 mg total) by mouth two (2) times a day. 180 tablet 3    fluconazole (DIFLUCAN) 150 MG tablet Take 1 tablet (150 mg total) by mouth once for 1 dose. 1 tablet 0    fluoride, sodium, 1.1 % Pste       fluticasone propionate (FLONASE) 50 mcg/actuation nasal spray 1 spray into each nostril two (2) times a day. 16 g 11    glipiZIDE (GLUCOTROL) 10 MG tablet Take 2 tablets (20 mg total) by mouth Two (2) times a day (30 minutes before a meal). 360 tablet 3    HUMALOG U-100 INSULIN 100 unit/mL injection INJECT 120 UNITS UNDER THE SKIN DAILY VIA OMNIPOD 100 mL 3    hydrOXYzine (ATARAX) 25 MG tablet Take 1 tablet (25 mg total) by mouth Three (3) times a day as needed. May take two before bed for sleep. 120 tablet 1    insulin glargine (LANTUS U-100 INSULIN) 100 unit/mL injection Inject 0.05 mL (5 Units total) under the skin nightly. (Patient taking differently: Inject 0.2 mL (20 Units total) under the skin nightly.) 6 mL 2    insulin syringe-needle U-100 (BD INSULIN SYRINGE ULTRA-FINE) 1 mL 31 gauge x 5/16 (8 mm) Syrg Once daily for insulin dosing. 100 each 1    lancets (ACCU-CHEK FASTCLIX LANCET DRUM) Misc Use to check blood sugars 3 times a day before meals or as directed 200 each 5    levothyroxine (SYNTHROID) 50 MCG tablet Take 1 tablet (50 mcg total) by mouth daily. 90 tablet 1    lidocaine (XYLOCAINE) 5 % ointment Apply topically Three (3) times a day as needed. 30 g 1    losartan (COZAAR) 50 MG tablet Take 1 tablet (50 mg total) by mouth daily. 90 tablet 3    montelukast (SINGULAIR) 10 mg tablet Take 1 tablet (10 mg total) by mouth daily. 90 tablet 3    multivitamin (MULTIPLE VITAMIN ORAL) Take 1 tablet by mouth daily.      OMNIPOD 5 PACK PODS Omnipod 5 G6/G7 system POD (Gen 5) (5 PODS) Change POD every 48 hours 3 each 11    ondansetron (ZOFRAN) 4 MG tablet Take 1 tablet (4 mg total) by mouth every eight (8) hours as needed for nausea. 30 tablet 1    oxybutynin (DITROPAN) 5 MG tablet Take 1 tablet (5 mg total) by mouth two (2) times a day. 180 tablet 1    propranoloL (INDERAL LA) 120 mg 24 hr capsule Take 1 capsule (120 mg total) by mouth daily. 90 capsule 1    ramelteon (ROZEREM) 8 mg tablet Take 1 tablet (8 mg total) by mouth nightly.      safety needles (BD SAFETYGLIDE NEEDLE) 18 gauge x 1 1/2 Ndle USE ONE NEEDLE ONCE WEEKLY      sour cherry extract (TART CHERRY EXTRACT) 1,000 mg cap       syringe with needle (BD LUER-LOK SYRINGE) 3 mL 21 gauge  x 1 1/2 Syrg To draw up testosterone  once weekly 100 each 0 tadalafil  (CIALIS ) 5 MG tablet Take 1 tablet (5 mg total) by mouth in the morning. 90 tablet 3    testosterone  cypionate (DEPOTESTOTERONE CYPIONATE) 200 mg/mL injection Inject 0.5 mL (100 mg total) into the muscle once a week. Inject 0.5cc (100mg ) weekly 2 mL 5    topiramate  (TOPAMAX ) 50 MG tablet Take 1.5 tablets (75 mg total) by mouth two (2) times a day. 270 tablet 3    traMADol  (ULTRAM ) 50 mg tablet Take 1 tablet (50 mg total) by mouth every six (6) hours. As needed for pain 20 tablet 0    WELLBUTRIN XL 150 mg 24 hr tablet Take 1 tablet (150 mg total) by mouth every morning.      albuterol  2.5 mg /3 mL (0.083 %) nebulizer solution Inhale 3 mL (2.5 mg total) by nebulization every four (4) hours as needed for wheezing. (Patient not taking: Reported on 08/21/2022) 180 mL 2    albuterol  HFA 90 mcg/actuation inhaler Inhale 2 puffs every six (6) hours as needed for wheezing. (Patient not taking: Reported on 09/17/2023) 8.5 g 5    colchicine  (MITIGARE ) 0.6 mg cap capsule Take 1 capsule (0.6 mg total) by mouth daily. (Patient not taking: Reported on 09/17/2023) 90 capsule 0    doxycycline  (VIBRAMYCIN ) 100 MG capsule Take 1 capsule (100 mg total) by mouth two (2) times a day. For 7-10 days as needed for HS flares. Can repeat as needed for future flares. (Patient not taking: Reported on 09/03/2023) 90 capsule 0    fluocinonide (LIDEX) 0.05 % gel Apply 1 Application topically Three (3) times a day. (Patient not taking: Reported on 09/17/2023)      hydrocortisone  (PROCTOSOL HC) 2.5 % rectal cream Insert 1 Application into the rectum two (2) times a day as needed. (Patient not taking: Reported on 09/17/2023) 28.35 g 2    ibuprofen  (ADVIL ,MOTRIN ) 800 MG tablet Take 1 tablet (800 mg total) by mouth every eight (8) hours as needed. (Patient not taking: Reported on 09/17/2023) 90 tablet 1    insulin  syringe-needle U-100 1 mL 31 gauge x 5/16 (8 mm) Syrg USE AS DIRECTED ONE TIME DAILY FOR INSULIN  DOSING      insulin  syringe-needle U-100 1 mL 31 gauge x 5/16 (8 mm) Syrg Once daily for insulin  dosing. 100 each 3    polyethylene glycol (GLYCOLAX ) 17 gram/dose powder  (Patient not taking: Reported on 09/17/2023)       No current facility-administered medications for this visit.       ROS:     Pertinent items are noted in HPI.  The following portions of the patient's history were reviewed and updated as appropriate: allergies, current medications, past medical history, past social history and problem list.    Vital Signs:     Wt Readings from Last 3 Encounters:   09/17/23 (!) 214.6 kg (473 lb)   08/08/23 (!) 215.5 kg (475 lb)   07/08/23 (!) 212.3 kg (468 lb)     Temp Readings from Last 3 Encounters:   09/17/23 36.7 ??C (98.1 ??F) (Oral)   08/08/23 36.7 ??C (98 ??F) (Oral)   07/08/23 36.9 ??C (98.4 ??F) (Oral)     BP Readings from Last 3 Encounters:   09/17/23 140/70   08/08/23 130/76   07/08/23 142/84     Pulse Readings from Last 3 Encounters:   09/17/23 89   08/08/23 94   07/08/23 96  Estimated body mass index is 65.97 kg/m?? as calculated from the following:    Height as of this encounter: 180.3 cm (5' 11).    Weight as of this encounter: 214.6 kg (473 lb).  Facility age limit for growth %iles is 20 years.         Objective:      General: Alert & Oriented X 3, NAD  Abdomen: Positive bowel sounds, soft, non-tender, non-distended.  Back: No CVA TTP.     Labs:     Results for orders placed or performed in visit on 09/17/23   POCT urinalysis dipstick   Result Value Ref Range    Color, UA Yellow     Clarity, UA Clear     Glucose, UA Negative Negative    Bilirubin, UA Negative Negative    Ketones, POC Negative Negative    Spec Grav, UA 1.025 1.005 - 1.030    Blood, UA Trace-intact (A) Negative    pH, UA 5.5 5.0 - 9.0    Protein, UA Negative Negative    Urobilinogen, UA 0.2 E.U./dL Negative (0.2 mg/dL)    Leukocytes, UA Negative Negative    Nitrite, UA Negative Negative    STRIP LOT NUMBER .     STRIP LOT EXPIRATION .            Laurence Pons, FNP

## 2023-09-18 ENCOUNTER — Ambulatory Visit
Admit: 2023-09-18 | Discharge: 2023-09-19 | Attending: Student in an Organized Health Care Education/Training Program | Primary: Student in an Organized Health Care Education/Training Program

## 2023-09-18 DIAGNOSIS — Z79899 Other long term (current) drug therapy: Principal | ICD-10-CM

## 2023-09-18 DIAGNOSIS — L732 Hidradenitis suppurativa: Principal | ICD-10-CM

## 2023-09-18 MED ORDER — COSENTYX UNOREADY PEN 300 MG/2 ML SUBCUTANEOUS
0 refills | 0.00 days | Status: CP
Start: 2023-09-18 — End: ?
  Filled 2023-09-24: qty 10, 35d supply, fill #0

## 2023-09-18 NOTE — Unmapped (Signed)
 Meet your team:     Your intake nurse is: Demar Shad    Please remember to fill out the survey you will receive after your visit. Your comments help us  continue to improve our care.      Thanks in advance!      [facility] Clinical Staff

## 2023-09-22 DIAGNOSIS — L732 Hidradenitis suppurativa: Principal | ICD-10-CM

## 2023-09-22 MED FILL — HUMIRA(CF) PEN 80 MG/0.8 ML SUBCUTANEOUS KIT: 28 days supply | Qty: 4 | Fill #2

## 2023-09-23 MED ORDER — EMPTY CONTAINER
2 refills | 0.00 days
Start: 2023-09-23 — End: ?

## 2023-09-23 NOTE — Unmapped (Signed)
 [facility]    Patient Onboarding/Medication Counseling    Logan Quinn is a 43 y.o. male with hidradenitis suppurativa who I am counseling today on initiation of therapy.  I am speaking to the patient.    Was a Nurse, learning disability used for this call? No    Verified patient's date of birth / HIPAA.    Specialty medication(s) to be sent: Inflammatory Disorders: Cosentyx       Non-specialty medications/supplies to be sent: sharps kit      Medications not needed at this time: na         Cosentyx  (secukinumab )    Medication & Administration     Dosage: Hidradenitis Suppurativa: Inject 300mg  under the skin at weeks 0, 1, 2, 3, and 4 followed by 300 mg every 4 weeks; consider an increase to 300 mg every 2 weeks in patients who have an inadequate response      Lab tests required prior to treatment initiation:  Tuberculosis: Tuberculosis screening resulted in a non-reactive Quantiferon TB Gold assay.      Administration:     Prefilled Unoready?? auto-injector pen  Gather all supplies needed for injection on a clean, flat working surface: medication pen removed from packaging, alcohol swab, sharps container, etc.  Look at the medication label - look for correct medication, correct dose, and check the expiration date  Look at the medication - the liquid visible in the window on the side of the pen device should appear clear and colorless to slightly yellow  Lay the auto-injector pen on a flat surface and allow it to warm up to room temperature for at least  30-45 minutes  Select injection site - you can use the front of your thigh or your belly (but not the area 2 inches around your belly button); if someone else is giving you the injection you can also use your upper arm in the skin covering your triceps muscle  Prepare injection site - wash your hands and clean the skin at the injection site with an alcohol swab and let it air dry, do not touch the injection site again before the injection  Pull off the purple safety cap in the direction of the arrow, do not remove until immediately prior to injection and do not touch the red needle cover  Put the red needle cover against your skin at the injection site at a 90 degree angle, hold the pen such that you can see the clear medication window  Press down and hold the pen firmly against your skin, there will be a click when the injection starts  Continue to hold the pen firmly against your skin for about 10-15 seconds - the window will start to turn solid green  There will be a second click sound when the injection is almost complete, verify the window is solid green to indicate the injection is complete and then pull the pen away from your skin  Dispose of the used auto-injector pen immediately in your sharps disposal container the needle will be covered automatically  If you see any blood at the injection site, press a cotton ball or gauze on the site and maintain pressure until the bleeding stops, do not rub the injection site      Adherence/Missed dose instructions:  If your injection is given more than 4 days after your scheduled injection date - consult your pharmacist for additional instructions on how to adjust your dosing schedule.  Goals of Therapy     Hidradenitis Suppurativa  Reduce the frequency and severity of new lesions  Minimize pain and suppuration  Prevent disease progression and limit scarring  Maintenance of effective psychosocial functioning      Side Effects & Monitoring Parameters     Injection site reaction (redness, irritation, inflammation localized to the site of administration)  Signs of a common cold - minor sore throat, runny or stuffy nose, etc.  Diarrhea    The following side effects should be reported to the provider:  Signs of a hypersensitivity reaction - rash; hives; itching; red, swollen, blistered, or peeling skin; wheezing; tightness in the chest or throat; difficulty breathing, swallowing, or talking; swelling of the mouth, face, lips, tongue, or throat; etc.  Reduced immune function - report signs of infection such as fever; chills; body aches; very bad sore throat; ear or sinus pain; cough; more sputum or change in color of sputum; pain with passing urine; wound that will not heal, etc.  Also at a slightly higher risk of some malignancies (mainly skin and blood cancers) due to this reduced immune function.  In the case of signs of infection - the patient should hold the next dose of Cosentyx ?? and call your primary care provider to ensure adequate medical care.  Treatment may be resumed when infection is treated and patient is asymptomatic.  Muscle pain or weakness  Shortness of breath      Warnings, Precautions, & Contraindications     Have your bloodwork checked as you have been told by your prescriber  Talk with your doctor if you are pregnant, planning to become pregnant, or breastfeeding  Discuss the possible need for holding your dose(s) of Cosentyx ?? when a planned procedure is scheduled with the prescriber as it may delay healing/recovery timeline       Drug/Food Interactions     Medication list reviewed in Epic. The patient was instructed to inform the care team before taking any new medications or supplements. No drug interactions identified.   If you have a latex allergy use caution when handling, the needle cap of the Cosentyx ?? prefilled syringe and the safety cap for the Cosentyx  Sensoready?? pen contains a derivative of natural rubber latex. Unoready?? pen does NOT contain latex.   Talk with you prescriber or pharmacist before receiving any live vaccinations while taking this medication and after you stop taking it      Storage, Handling Precautions, & Disposal     Store this medication in the refrigerator.  Do not freeze  May store intact Sensoready pens and 150 mg/mL prefilled syringes at <=30??C (<=86??F) for up to 4 days; may return to the refrigerator if unused  Store in original packaging, protected from light  Do not shake  Dispose of used syringes/pens in a sharps disposal container           Current Medications (including OTC/herbals), Comorbidities and Allergies     Current Outpatient Medications   Medication Sig Dispense Refill    adalimumab  (HUMIRA ,CF, PEN) 80 mg/0.8 mL PnKt Inject the contents of 1 pen (80 mg) subcutaneously once every week 4 each 11    albuterol  2.5 mg /3 mL (0.083 %) nebulizer solution Inhale 3 mL (2.5 mg total) by nebulization every four (4) hours as needed for wheezing. (Patient not taking: Reported on 08/21/2022) 180 mL 2    albuterol  HFA 90 mcg/actuation inhaler Inhale 2 puffs every six (6) hours as needed for wheezing. (Patient not taking: Reported on  09/17/2023) 8.5 g 5    allopurinol (ZYLOPRIM) 300 MG tablet Take 1 tablet (300 mg total) by mouth daily. 90 tablet 3    ALPRAZolam (XANAX) 0.5 MG tablet Takes 1 tablet nightly and 1 tablet a day when needed      ascorbic acid (VITAMIN C ORAL) Take 1 capsule by mouth nightly.      atorvastatin (LIPITOR) 20 MG tablet Take 1 tablet (20 mg total) by mouth daily. 90 tablet 3    blood sugar diagnostic (ACCU-CHEK GUIDE TEST STRIPS) Strp by Other route Three (3) times a day before meals. 300 strip 3    blood-glucose meter kit Use as directed 1 each 0    budesonide-formoterol (SYMBICORT) 80-4.5 mcg/actuation inhaler Inhale 2 puffs two (2) times a day. 10.2 g 0    clindamycin (CLEOCIN T) 1 % lotion Apply topically two (2) times a day. To affected areas on body as needed for flares 60 mL 3    clobetasoL (TEMOVATE) 0.05 % ointment Apply daily to painful affected areas if needed for flares, then stop 15 g 1    colchicine (MITIGARE) 0.6 mg cap capsule Take 1 capsule (0.6 mg total) by mouth daily. (Patient not taking: Reported on 09/17/2023) 90 capsule 0    cyclobenzaprine (FLEXERIL) 10 MG tablet Take 1 tablet (10 mg total) by mouth Three (3) times a day as needed for muscle spasms. 60 tablet 1    dextroamphetamine sulfate (DEXTROSTAT) 10 MG tablet Take 3 tablets (30 mg total) by mouth two (2) times a day.      diclofenac (VOLTAREN) 75 MG EC tablet Take 1 tablet (75 mg total) by mouth two (2) times a day.      dicyclomine (BENTYL) 10 mg capsule Take 1 capsule (10 mg total) by mouth Four (4) times a day (before meals and nightly). 120 capsule 1    diphenoxylate-atropine (LOMOTIL) 2.5-0.025 mg per tablet Take 1 tablet by mouth four (4) times a day as needed for diarrhea. 30 tablet 1    doxycycline (VIBRAMYCIN) 100 MG capsule Take 1 capsule (100 mg total) by mouth two (2) times a day. For 7-10 days as needed for HS flares. Can repeat as needed for future flares. (Patient not taking: Reported on 09/03/2023) 90 capsule 0    dulaglutide 1.5 mg/0.5 mL PnIj Inject 0.5 mL (1.5 mg total) under the skin every seven (7) days. 2 mL 2    DULoxetine (CYMBALTA) 60 MG capsule Take 1 capsule (60 mg total) by mouth Two (2) times a day. 180 capsule 0    empty container Misc Use as directed to dispose of needles. When full, make sure lid is closed tightly then dispose of container in trash. 1 each 2    empty container Misc Use as directed to dispose of Cosentyx pens. 1 each 2    erenumab-aooe (AIMOVIG AUTOINJECTOR) auto-injector Inject 1 Pen under the skin every thirty (30) days. 3 mL 3    famotidine (PEPCID) 20 MG tablet Take 1 tablet (20 mg total) by mouth two (2) times a day. 180 tablet 3    fluconazole (DIFLUCAN) 150 MG tablet Take 150 mg now and repeat 150 mg in 5-7 days. 2 tablet 0    fluocinonide (LIDEX) 0.05 % gel Apply 1 Application topically Three (3) times a day. (Patient not taking: Reported on 09/17/2023)      fluoride, sodium, 1.1 % Pste       fluticasone propionate (FLONASE) 50 mcg/actuation nasal spray 1 spray into  each nostril two (2) times a day. 16 g 11    glipiZIDE (GLUCOTROL) 10 MG tablet Take 2 tablets (20 mg total) by mouth Two (2) times a day (30 minutes before a meal). 360 tablet 3    HUMALOG U-100 INSULIN 100 unit/mL injection INJECT 120 UNITS UNDER THE SKIN DAILY VIA OMNIPOD 100 mL 3    hydrocortisone (PROCTOSOL HC) 2.5 % rectal cream Insert 1 Application into the rectum two (2) times a day as needed. (Patient not taking: Reported on 09/17/2023) 28.35 g 2    hydrOXYzine (ATARAX) 25 MG tablet Take 1 tablet (25 mg total) by mouth Three (3) times a day as needed. May take two before bed for sleep. 120 tablet 1    ibuprofen (ADVIL,MOTRIN) 800 MG tablet Take 1 tablet (800 mg total) by mouth every eight (8) hours as needed. (Patient not taking: Reported on 09/17/2023) 90 tablet 1    insulin glargine (LANTUS U-100 INSULIN) 100 unit/mL injection Inject 0.05 mL (5 Units total) under the skin nightly. (Patient taking differently: Inject 0.2 mL (20 Units total) under the skin nightly.) 6 mL 2    insulin syringe-needle U-100 (BD INSULIN SYRINGE ULTRA-FINE) 1 mL 31 gauge x 5/16 (8 mm) Syrg Once daily for insulin dosing. 100 each 1    insulin syringe-needle U-100 1 mL 31 gauge x 5/16 (8 mm) Syrg USE AS DIRECTED ONE TIME DAILY FOR INSULIN DOSING      insulin syringe-needle U-100 1 mL 31 gauge x 5/16 (8 mm) Syrg Once daily for insulin dosing. 100 each 3    lancets (ACCU-CHEK FASTCLIX LANCET DRUM) Misc Use to check blood sugars 3 times a day before meals or as directed 200 each 5    levothyroxine (SYNTHROID) 50 MCG tablet Take 1 tablet (50 mcg total) by mouth daily. 90 tablet 1    lidocaine (XYLOCAINE) 5 % ointment Apply topically Three (3) times a day as needed. 30 g 1    losartan (COZAAR) 50 MG tablet Take 1 tablet (50 mg total) by mouth daily. 90 tablet 3    montelukast (SINGULAIR) 10 mg tablet Take 1 tablet (10 mg total) by mouth daily. 90 tablet 3    multivitamin (MULTIPLE VITAMIN ORAL) Take 1 tablet by mouth daily.      nitrofurantoin, macrocrystal-monohydrate, (MACROBID) 100 MG capsule Take 1 capsule (100 mg total) by mouth every twelve (12) hours. 20 capsule 0    OMNIPOD 5 PACK PODS Omnipod 5 G6/G7 system POD (Gen 5) (5 PODS) Change POD every 48 hours 3 each 11    ondansetron (ZOFRAN) 4 MG tablet Take 1 tablet (4 mg total) by mouth every eight (8) hours as needed for nausea. 30 tablet 1    oxybutynin (DITROPAN) 5 MG tablet Take 1 tablet (5 mg total) by mouth two (2) times a day. 180 tablet 1    polyethylene glycol (GLYCOLAX) 17 gram/dose powder  (Patient not taking: Reported on 09/17/2023)      propranoloL (INDERAL LA) 120 mg 24 hr capsule Take 1 capsule (120 mg total) by mouth daily. 90 capsule 1    ramelteon (ROZEREM) 8 mg tablet Take 1 tablet (8 mg total) by mouth nightly.      safety needles (BD SAFETYGLIDE NEEDLE) 18 gauge x 1 1/2 Ndle USE ONE NEEDLE ONCE WEEKLY      secukinumab (COSENTYX UNOREADY PEN) 300 mg/2 mL PnIj Inject the contents of 1 pen (300 mg) under the skin every 28 days 2 mL 11  secukinumab  (COSENTYX  UNOREADY PEN) 300 mg/2 mL PnIj Inject the contents of 1 pen (300 mg) under the skin once weekly for 5 weeks (on weeks 0,1,2,3,4) for loading dose. 10 mL 0    sour cherry extract (TART CHERRY EXTRACT) 1,000 mg cap       syringe with needle (BD LUER-LOK SYRINGE) 3 mL 21 gauge x 1 1/2 Syrg To draw up testosterone  once weekly 100 each 0    tadalafil  (CIALIS ) 5 MG tablet Take 1 tablet (5 mg total) by mouth in the morning. 90 tablet 3    testosterone  cypionate (DEPOTESTOTERONE CYPIONATE) 200 mg/mL injection Inject 0.5 mL (100 mg total) into the muscle once a week. Inject 0.5cc (100mg ) weekly 2 mL 5    topiramate  (TOPAMAX ) 50 MG tablet Take 1.5 tablets (75 mg total) by mouth two (2) times a day. 270 tablet 3    traMADol  (ULTRAM ) 50 mg tablet Take 1 tablet (50 mg total) by mouth every six (6) hours. As needed for pain 20 tablet 0    WELLBUTRIN XL 150 mg 24 hr tablet Take 1 tablet (150 mg total) by mouth every morning.       No current facility-administered medications for this visit.       Allergies   Allergen Reactions    Erythromycin Nausea And Vomiting     Other reaction(s): Vomiting    Penicillins Rash, Swelling and Hives    Sulfa (Sulfonamide Antibiotics) Nausea And Vomiting and Nausea Only    Other Metronidazole Nausea And Vomiting       Patient Active Problem List   Diagnosis    Diabetes type 2, uncontrolled    Lesion of radial nerve    GERD (gastroesophageal reflux disease) (RAF-HCC)    Depression (RAF-HCC)    Suicidal ideation    Abdominal pain    Headache    NAFLD (nonalcoholic fatty liver disease)    Bleeding external hemorrhoids    Anxiety    Diarrhea    Morbid (severe) obesity due to excess calories (CMS-HCC)    Type 2 diabetes mellitus with hyperglycemia    Left shoulder pain    Trapezius muscle strain    Primary insomnia    Chronic joint pain    Cellulitis of right lower extremity    No diabetic retinopathy in both eyes    Congenital hypertrophy of retinal pigment epithelium    H/O retinal detachment    IBS (irritable bowel syndrome)    Urinary incontinence, nocturnal enuresis    Severe obstructive sleep apnea    OSA on CPAP    Hidradenitis suppurativa    Acute non-recurrent pansinusitis    Acquired hypothyroidism    Essential hypertension    Mild intermittent asthma without complication    Chronic pansinusitis    Environmental and seasonal allergies    Erectile dysfunction    Sinus congestion    Nasal congestion    Cough variant asthma    Simple chronic bronchitis    Type 2 diabetes mellitus with diabetic polyneuropathy, with long-term current use of insulin        Medication list has been reviewed and updated in Epic: Yes    Allergies have been reviewed and updated in Epic: Yes    Appropriateness of Therapy     Acute infections noted within Epic:  No active infections  Patient reported infection: None    Is the medication and dose appropriate based on diagnosis, medication list, comorbidities, allergies, medical history, patient???s ability to self-administer the medication,  and therapeutic goals? Yes    Prescription has been clinically reviewed: Yes      Baseline Quality of Life Assessment      How many days over the past month did your HS  keep you from your normal activities? For example, brushing your teeth or getting up in the morning. Patient declined to answer    Financial Information     Medication Assistance provided: Prior Authorization    Anticipated copay of $0 reviewed with patient. Verified delivery address.    Delivery Information     Scheduled delivery date: 4/16    Expected start date: 4/17      Medication will be delivered via Same Day Courier to the prescription address in [facility].  This shipment will not require a signature.      Explained the services we provide at [facility] and that each month we would call to set up refills.  Stressed importance of returning phone calls so that we could ensure they receive their medications in time each month.  Informed patient that we should be setting up refills 7-10 days prior to when they will run out of medication.  A pharmacist will reach out to perform a clinical assessment periodically.  Informed patient that a welcome packet, containing information about our pharmacy and other support services, a Notice of Privacy Practices, and a drug information handout will be sent.      The patient or caregiver noted above participated in the development of this care plan and knows that they can request review of or adjustments to the care plan at any time.      Patient or caregiver verbalized understanding of the above information as well as how to contact the pharmacy at [phone] option 4 with any questions/concerns.  The pharmacy is open Monday through Friday 8:30am-4:30pm.  A pharmacist is available 24/7 via pager to answer any clinical questions they may have.    Patient Specific Needs     Does the patient have any physical, cognitive, or cultural barriers? No    Does the patient have adequate living arrangements? (i.e. the ability to store and take their medication appropriately) Yes    Did you identify any home environmental safety or security hazards? No    Patient prefers to have medications discussed with Patient     Is the patient or caregiver able to read and understand education materials at a high school level or above? Yes    Patient's primary language is  English     Is the patient high risk? No    Does the patient have an additional or emergency contact listed in their chart? Yes    SOCIAL DETERMINANTS OF HEALTH     At the [facility], we have learned that life circumstances - like trouble affording food, housing, utilities, or transportation can affect the health of many of our patients.   That is why we wanted to ask: are you currently experiencing any life circumstances that are negatively impacting your health and/or quality of life? Patient declined to answer    Social Drivers of Health     Food Insecurity: No Food Insecurity (09/17/2023)    Hunger Vital Sign     Worried About Running Out of Food in the Last Year: Never true     Ran Out of Food in the Last Year: Never true   Tobacco Use: Low Risk  (09/18/2023)    Patient History  Smoking Tobacco Use: Never     Smokeless Tobacco Use: Never     Passive Exposure: Never   Transportation Needs: No Transportation Needs (09/17/2023)    PRAPARE - Transportation     Lack of Transportation (Medical): No     Lack of Transportation (Non-Medical): No   Alcohol Use: Not At Risk (09/17/2023)    Alcohol Use     How often do you have a drink containing alcohol?: Never     How many drinks containing alcohol do you have on a typical day when you are drinking?: Not on file     How often do you have 5 or more drinks on one occasion?: Never   Housing: Low Risk  (09/17/2023)    Housing     Within the past 12 months, have you ever stayed: outside, in a car, in a tent, in an overnight shelter, or temporarily in someone else's home (i.e. couch-surfing)?: No     Are you worried about losing your housing?: No   Physical Activity: Not on file   Utilities: Low Risk  (09/17/2023)    Utilities     Within the past 12 months, have you been unable to get utilities (heat, electricity) when it was really needed?: No   Stress: Not on file   Interpersonal Safety: Not At Risk (09/17/2023)    Interpersonal Safety     Unsafe Where You Currently Live: No     Physically Hurt by Anyone: No     Abused by Anyone: No   Substance Use: Low Risk  (09/17/2023)    Substance Use     In the past year, how often have you used prescription drugs for non-medical reasons?: Never     In the past year, how often have you used illegal drugs?: Never     In the past year, have you used any substance for non-medical reasons?: No   Intimate Partner Violence: Unknown (09/13/2021)    Received from [facility]    HITS     Physically Hurt: Not on file     Insult or Talk Down To: Not on file     Threaten Physical Harm: Not on file     Scream or Curse: Not on file   Social Connections: Unknown (10/09/2021)    Received from [facility], [facility]    Social Network     Social Network: Not on file   Financial Resource Strain: Low Risk  (07/02/2022)    Overall Financial Resource Strain (CARDIA)     Difficulty of Paying Living Expenses: Not hard at all   Depression: Not at risk (03/05/2023)    PHQ-2     PHQ-2 Score: 1   Internet Connectivity: Not on file   Health Literacy: Not on file       Would you be willing to receive help with any of the needs that you have identified today? Not applicable       Misty Rago [facility] Specialty Pharmacist

## 2023-09-24 ENCOUNTER — Ambulatory Visit: Admitting: Physical Therapy

## 2023-09-24 DIAGNOSIS — L732 Hidradenitis suppurativa: Principal | ICD-10-CM

## 2023-09-24 MED ORDER — CLOBETASOL 0.05 % TOPICAL OINTMENT
1 refills | 0.00 days | Status: CP
Start: 2023-09-24 — End: ?

## 2023-09-24 MED FILL — EMPTY CONTAINER: 120 days supply | Qty: 1 | Fill #0

## 2023-09-27 ENCOUNTER — Encounter: Payer: Self-pay | Admitting: Physical Therapy

## 2023-09-27 NOTE — Therapy (Signed)
 OUTPATIENT PHYSICAL THERAPY LOWER EXTREMITY EVALUATION  Patient Name: Jason Davidson MRN: [id number] DOB:[birth year], 43 y.o., male Today's Date: 09/17/2023  END OF SESSION:  PT End of Session - 09/27/23 0546     Visit Number 1    Number of Visits 6    Date for PT Re-Evaluation 10/29/23    PT Start Time 1359    PT Stop Time 1436    PT Time Calculation (min) 37 min             Past Medical History:  Diagnosis Date   Asthma    Depressed    Diabetes mellitus without complication (HCC)    Gout    IBS (irritable bowel syndrome)    Morbid obesity (HCC)    Sleep apnea    Thyroid disease    Past Surgical History:  Procedure Laterality Date   COLONOSCOPY     ESOPHAGOGASTRODUODENOSCOPY ENDOSCOPY     EYE SURGERY     LASER ABLATION INCOMPETENT VEIN INI     Patient Active Problem List   Diagnosis Date Noted   Sprain of left ankle 07/03/2022   Tinea pedis 03/18/2022   Chronic pansinusitis 11/28/2021   Cough variant asthma 11/28/2021   Environmental and seasonal allergies 11/28/2021   Erectile dysfunction 11/28/2021   Nasal congestion 11/28/2021   Sinus congestion 11/28/2021   Mild intermittent asthma without complication 10/11/2021   Essential hypertension 10/10/2021   Acquired hypothyroidism 01/09/2021   Acute non-recurrent pansinusitis 07/19/2020   Hidradenitis suppurativa 04/29/2018   Allergic rhinitis 12/23/2017   Varicose veins of both lower extremities with pain 10/16/2017   IBS (irritable bowel syndrome) 06/18/2017   Chronic venous insufficiency 12/31/2016   Lymphedema 12/31/2016   Pain in joint, ankle and foot 12/31/2016   Diabetes (HCC) 12/31/2016   GERD (gastroesophageal reflux disease) 12/31/2016   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   H/O retinal detachment 10/01/2016   No diabetic retinopathy in both eyes 10/01/2016   Cellulitis of right lower extremity 06/19/2016   Chronic joint pain 11/16/2015   Skin infection  05/15/2015   Depressed    Tick bite 10/20/2014   Primary insomnia 07/08/2014   Candidal balanitis 02/16/2014   Skin tag 01/17/2014   Left shoulder pain 10/04/2013   Anxiety 09/13/2013   Diarrhea 09/13/2013   Bleeding external hemorrhoids 09/03/2013   NAFLD (nonalcoholic fatty liver disease) 91/47/8295   Headache 07/05/2013   Abdominal pain 03/05/2013   Morbid (severe) obesity due to excess calories (HCC) 03/05/2013   Suicidal ideation 02/09/2013   Obstructive sleep apnea (adult) (pediatric) 07/21/2012   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    PCP: Laurence Pons, NP  REFERRING PROVIDER: Jeni Mitten, DPM  REFERRING DIAG: M72.2 (ICD-10-CM) - Plantar fasciitis of left foot   THERAPY DIAG:  Pain in left foot  Ankle joint stiffness, left  Muscle weakness (generalized)  Gait difficulty  Rationale for Evaluation and Treatment: Rehabilitation  ONSET DATE: Chronic  SUBJECTIVE:   SUBJECTIVE STATEMENT: Pt. Reports chronic h/o L foot/ ankle pain.  Pt. Had a fall over a year ago with L ankle issues.  Pt. Has over the counter orthotics and wearing New Balance shoes.  Pt. Received 2 injections to foot in past and occasional using ankle brace/ strap to mange pain symptoms.   PERTINENT HISTORY:   Subjective:  Patient ID: Jason Davidson, male    DOB: 1980-07-19,  MRN: [id number]       Chief Complaint  Patient presents with  Plantar Fasciitis      "It's hurting."      43 y.o. male presents with the above complaint. History confirmed with patient.  He has been dealing with heel pain for some time and the injection he had at his last visit with Dr. Lara Plants was helpful, has worn off the meloxicam  did not help much, brace was helping as well as it broke recently and the strap was not working more   Objective:  Physical Exam: warm, good capillary refill, no trophic changes or ulcerative lesions, normal DP and PT pulses, normal sensory exam, and pain on palpation of plantar  medial heel at insertion of plantar fascia.     Radiographs: Previous x-rays taken December 24 showed no stress fracture or heel spur Assessment:    1. Plantar fasciitis of left foot         Plan:  Patient was evaluated and treated and all questions answered.   Discussed the etiology and treatment options for plantar fasciitis including stretching, formal physical therapy, supportive shoegears such as a running shoe or sneaker, pre fabricated orthoses, injection therapy, and oral medications. We also discussed the role of surgical treatment of this for patients who do not improve after exhausting non-surgical treatment options.     -Educated patient on stretching and icing of the affected limb -Plantar fascial brace dispensed again his previous brace failed.  Replacement was given today -Injection delivered to the plantar fascia of the left foot. -Rx for diclofenac . Educated on use, risks and benefits of the medication   After sterile prep with povidone-iodine solution and alcohol, the left heel was injected with 0.5cc 2% xylocaine  plain, 0.5cc 0.5% marcaine plain, 10mg  triamcinolone  acetonide, and 4mg  dexamethasone  was injected along the medial plantar fascia at the insertion on the plantar calcaneus. The patient tolerated the procedure well without complication.   Return if symptoms worsen or fail to improve.      PAIN:  Are you having pain? Yes: NPRS scale: 5/10 Pain location: L heel Pain description: sharp/ persistent Aggravating factors: walking Relieving factors: rest/ injections  PRECAUTIONS: Fall  RED FLAGS: None   WEIGHT BEARING RESTRICTIONS: No  FALLS:  Has patient fallen in last 6 months? 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  DIAGNOSTIC FINDINGS: CLINICAL DATA:  Fall 3 days ago with left ankle pain.   EXAM: LEFT ANKLE COMPLETE - 3+ VIEW   COMPARISON:  None Available.   FINDINGS: There is no evidence of fracture, dislocation, or joint effusion. There is no evidence of arthropathy or other focal bone abnormality. Soft tissues are unremarkable.  PATIENT SURVEYS:  LEFS 29 out of 80  COGNITION: Overall cognitive status: Within functional limits for tasks assessed     SENSATION: WFL  EDEMA:  L lower leg lymphadema  MUSCLE LENGTH: Hamstrings: limited B proximal/distal hamstring flexibility.  Thomas test: NT  POSTURE: rounded shoulders and forward head  PALPATION: (+) tenderness with palpation over L plantar aspect of foot (medial).  Good L great toe extension.    LOWER EXTREMITY ROM:  Active ROM Right eval Left eval  Hip flexion    Hip extension    Hip abduction    Hip adduction  Hip internal rotation    Hip external rotation    Knee flexion [facility]  Knee extension [facility]  Ankle dorsiflexion 11 deg. 12 deg.  Ankle plantarflexion 50 deg. 58 deg.  Ankle inversion 30 deg. 30 deg.  Ankle eversion 22 deg. 20 deg.    (Blank rows = not tested)  LOWER EXTREMITY MMT:  MMT Right eval Left eval  Hip flexion    Hip extension    Hip abduction    Hip adduction    Hip internal rotation    Hip external rotation    Knee flexion 5 5  Knee extension 5 5  Ankle dorsiflexion 5 5  Ankle plantarflexion 4 4  Ankle inversion 4+ 4+  Ankle eversion 4+ 4+   (Blank rows = not tested)  LOWER EXTREMITY SPECIAL TESTS:  NT  FUNCTIONAL TESTS:  NT  GAIT: Distance walked: in clinic Assistive device utilized: None Level of assistance: Complete Independence Comments: L antalgic gait pattern.  Discussed use of SPC to decrease L foot/heel wt. bearing                                                                                                                               TREATMENT DATE: 09/17/2023  See evaluation/ HEP/ discussed proper shoe wear (Fleet Feet)    PATIENT EDUCATION:  Education details: HEP/ shoe wear/ Ice massage Person educated: Patient Education method: Explanation, Demonstration, and Handouts Education comprehension: verbalized understanding and returned demonstration  HOME EXERCISE PROGRAM: Access Code: [email or web address] URL: [email or web address] Date: 09/17/2023 Prepared by: Hazeline Lister  Exercises - Seated Gastroc Stretch with Strap  - 1 x daily - 7 x weekly - 1 sets - 5 reps - Seated Self Great Toe Stretch  - 1 x daily - 7 x weekly - 1 sets - 5 reps - Foot Roller Plantar Massage  - 1 x daily - 7 x weekly - 1 sets - 5 reps  ASSESSMENT:  CLINICAL IMPRESSION: Patient is a pleasant 43 y.o. male who was seen today for physical therapy evaluation and treatment for L foot pain.   Pt. Known to PT clinic and presents with chronic L plantar foot/ ankle pain.  Pt. Has h/o lymphedema swelling in L lower leg/ ankle.  Pt. Presents with L ankle weakness and pain with prolonged standing/walking tasks.  Pt. Instructed in the benefits of orthotics/ proper shoe wear.  Pt. Will benefit from short-term skilled PT services to develop stretching/ strengthening ex. Program and education to improve pain-free mobility.     OBJECTIVE IMPAIRMENTS: Abnormal gait, decreased activity tolerance, decreased balance, decreased mobility, difficulty walking, decreased ROM, decreased strength, impaired flexibility, improper body mechanics, obesity, and pain.   ACTIVITY LIMITATIONS: squatting and locomotion level  PARTICIPATION LIMITATIONS: community activity, occupation, and yard work  PERSONAL FACTORS: Fitness and Past/current experiences are also affecting patient's functional outcome.   REHAB POTENTIAL: Good  CLINICAL DECISION MAKING: Evolving/moderate  complexity  EVALUATION COMPLEXITY: Moderate  GOALS: Goals reviewed with patient? Yes  SHORT TERM GOALS: Target date: 10/08/23 Pt. Independent with HEP to increase L ankle stability to WNL as compared to R to improve pain-free mobility.   Baseline:  see above Goal status: INITIAL   LONG TERM GOALS: Target date: 10/29/23  Pt. Will increase LEFS to >40 out of 80 to improve pain-free mobility. Baseline: 29 out of 80 Goal status: INITIAL  2.  Pt. Will reports <3/10 L foot/ankle pain while completing job/ walking about mobile home park.   Baseline: >5/10 L foot pain currently Goal status: INITIAL  3.  Pt. Able to complete camping trip with [employer] with no L foot pain/ limitations.   Baseline:  increase L foot pain/limitations.  Goal status: INITIAL  PLAN:  PT FREQUENCY: 1x/week  PT DURATION: 6 weeks  PLANNED INTERVENTIONS: 97110-Therapeutic exercises, 97530- Therapeutic activity, [id number]- Neuromuscular re-education, 97535- Self Care, 16109- Manual therapy, [phone]- Gait training, [phone]- Ionotophoresis 4mg /ml Dexamethasone , Patient/Family education, Balance training, Stair training, Taping, Dry Needling, Joint mobilization, Cryotherapy, and Moist heat  PLAN FOR NEXT SESSION: Reassess L plantar foot pain/ progress HEP  Lendell Quarry, PT, DPT # [phone] 09/27/2023, 5:53 AM

## 2023-09-29 ENCOUNTER — Ambulatory Visit: Admitting: Physical Therapy

## 2023-09-29 MED ORDER — DIPHENOXYLATE-ATROPINE 2.5 MG-0.025 MG TABLET
ORAL_TABLET | Freq: Four times a day (QID) | ORAL | 1 refills | 8.00 days | PRN
Start: 2023-09-29 — End: 2024-09-28

## 2023-09-30 NOTE — Therapy (Signed)
 OUTPATIENT PHYSICAL THERAPY LOWER EXTREMITY TREATMENT  Patient Name: Jason Davidson MRN: [id number] DOB:1981-01-19, 43 y.o., male Today's Date: 09/17/2023  END OF SESSION:    Past Medical History:  Diagnosis Date   Asthma    Depressed    Diabetes mellitus without complication (HCC)    Gout    IBS (irritable bowel syndrome)    Morbid obesity (HCC)    Sleep apnea    Thyroid disease    Past Surgical History:  Procedure Laterality Date   COLONOSCOPY     ESOPHAGOGASTRODUODENOSCOPY ENDOSCOPY     EYE SURGERY     LASER ABLATION INCOMPETENT VEIN INI     Patient Active Problem List   Diagnosis Date Noted   Sprain of left ankle 07/03/2022   Tinea pedis 03/18/2022   Chronic pansinusitis 11/28/2021   Cough variant asthma 11/28/2021   Environmental and seasonal allergies 11/28/2021   Erectile dysfunction 11/28/2021   Nasal congestion 11/28/2021   Sinus congestion 11/28/2021   Mild intermittent asthma without complication 10/11/2021   Essential hypertension 10/10/2021   Acquired hypothyroidism 01/09/2021   Acute non-recurrent pansinusitis 07/19/2020   Hidradenitis suppurativa 04/29/2018   Allergic rhinitis 12/23/2017   Varicose veins of both lower extremities with pain 10/16/2017   IBS (irritable bowel syndrome) 06/18/2017   Chronic venous insufficiency 12/31/2016   Lymphedema 12/31/2016   Pain in joint, ankle and foot 12/31/2016   Diabetes (HCC) 12/31/2016   GERD (gastroesophageal reflux disease) 12/31/2016   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   H/O retinal detachment 10/01/2016   No diabetic retinopathy in both eyes 10/01/2016   Cellulitis of right lower extremity 06/19/2016   Chronic joint pain 11/16/2015   Skin infection 05/15/2015   Depressed    Tick bite 10/20/2014   Primary insomnia 07/08/2014   Candidal balanitis 02/16/2014   Skin tag 01/17/2014   Left shoulder pain 10/04/2013   Anxiety 09/13/2013   Diarrhea 09/13/2013    Bleeding external hemorrhoids 09/03/2013   NAFLD (nonalcoholic fatty liver disease) 86/57/8469   Headache 07/05/2013   Abdominal pain 03/05/2013   Morbid (severe) obesity due to excess calories (HCC) 03/05/2013   Suicidal ideation 02/09/2013   Obstructive sleep apnea (adult) (pediatric) 07/21/2012   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    PCP: Laurence Pons, NP  REFERRING PROVIDER: Jeni Mitten, DPM  REFERRING DIAG: M72.2 (ICD-10-CM) - Plantar fasciitis of left foot   THERAPY DIAG:  Pain in left foot  Ankle joint stiffness, left  Muscle weakness (generalized)  Gait difficulty  Rationale for Evaluation and Treatment: Rehabilitation  ONSET DATE: Chronic  SUBJECTIVE:   SUBJECTIVE STATEMENT: Pt. Reports chronic h/o L foot/ ankle pain.  Pt. Had a fall over a year ago with L ankle issues.  Pt. Has over the counter orthotics and wearing New Balance shoes.  Pt. Received 2 injections to foot in past and occasional using ankle brace/ strap to mange pain symptoms.   PERTINENT HISTORY:   Subjective:  Patient ID: Jason Davidson, male    DOB: 1980/11/05,  MRN: [id number]       Chief Complaint  Patient presents with   Plantar Fasciitis      "It's hurting."      43 y.o. male presents with the above complaint. History confirmed with patient.  He has been dealing with heel pain for some time and the injection he had at his last visit with Dr. Lara Plants was helpful, has worn off the meloxicam  did not help much, brace was  helping as well as it broke recently and the strap was not working more   Objective:  Physical Exam: warm, good capillary refill, no trophic changes or ulcerative lesions, normal DP and PT pulses, normal sensory exam, and pain on palpation of plantar medial heel at insertion of plantar fascia.     Radiographs: Previous x-rays taken December 24 showed no stress fracture or heel spur Assessment:    1. Plantar fasciitis of left foot         Plan:  Patient  was evaluated and treated and all questions answered.   Discussed the etiology and treatment options for plantar fasciitis including stretching, formal physical therapy, supportive shoegears such as a running shoe or sneaker, pre fabricated orthoses, injection therapy, and oral medications. We also discussed the role of surgical treatment of this for patients who do not improve after exhausting non-surgical treatment options.     -Educated patient on stretching and icing of the affected limb -Plantar fascial brace dispensed again his previous brace failed.  Replacement was given today -Injection delivered to the plantar fascia of the left foot. -Rx for diclofenac . Educated on use, risks and benefits of the medication   After sterile prep with povidone-iodine solution and alcohol, the left heel was injected with 0.5cc 2% xylocaine  plain, 0.5cc 0.5% marcaine plain, 10mg  triamcinolone  acetonide, and 4mg  dexamethasone  was injected along the medial plantar fascia at the insertion on the plantar calcaneus. The patient tolerated the procedure well without complication.   Return if symptoms worsen or fail to improve.      PAIN:  Are you having pain? Yes: NPRS scale: 5/10 Pain location: L heel Pain description: sharp/ persistent Aggravating factors: walking Relieving factors: rest/ injections  PRECAUTIONS: Fall  RED FLAGS: None   WEIGHT BEARING RESTRICTIONS: No  FALLS:  Has patient fallen in last 6 months? 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  DIAGNOSTIC FINDINGS: CLINICAL DATA:  Fall 3 days ago with left ankle pain.   EXAM: LEFT ANKLE  COMPLETE - 3+ VIEW   COMPARISON:  None Available.   FINDINGS: There is no evidence of fracture, dislocation, or joint effusion. There is no evidence of arthropathy or other focal bone abnormality. Soft tissues are unremarkable.  PATIENT SURVEYS:  LEFS 29 out of 80  COGNITION: Overall cognitive status: Within functional limits for tasks assessed     SENSATION: WFL  EDEMA:  L lower leg lymphadema  MUSCLE LENGTH: Hamstrings: limited B proximal/distal hamstring flexibility.  Thomas test: NT  POSTURE: rounded shoulders and forward head  PALPATION: (+) tenderness with palpation over L plantar aspect of foot (medial).  Good L great toe extension.    LOWER EXTREMITY ROM:  Active ROM Right eval Left eval  Hip flexion    Hip extension    Hip abduction    Hip adduction    Hip internal rotation    Hip external rotation    Knee flexion [facility]  Knee extension [facility]  Ankle dorsiflexion 11 deg. 12 deg.  Ankle plantarflexion 50 deg. 58 deg.  Ankle inversion 30 deg. 30 deg.  Ankle eversion 22 deg. 20 deg.    (Blank rows = not tested)  LOWER  EXTREMITY MMT:  MMT Right eval Left eval  Hip flexion    Hip extension    Hip abduction    Hip adduction    Hip internal rotation    Hip external rotation    Knee flexion 5 5  Knee extension 5 5  Ankle dorsiflexion 5 5  Ankle plantarflexion 4 4  Ankle inversion 4+ 4+  Ankle eversion 4+ 4+   (Blank rows = not tested)  LOWER EXTREMITY SPECIAL TESTS:  NT  FUNCTIONAL TESTS:  NT  GAIT: Distance walked: in clinic Assistive device utilized: None Level of assistance: Complete Independence Comments: L antalgic gait pattern.  Discussed use of SPC to decrease L foot/heel wt. bearing                                                                                                                              TREATMENT DATE: 09/30/23  Subjective:  ***     PATIENT EDUCATION:  Education details: HEP/ shoe wear/ Ice  massage Person educated: Patient Education method: Explanation, Demonstration, and Handouts Education comprehension: verbalized understanding and returned demonstration  HOME EXERCISE PROGRAM: Access Code: [email or web address] URL: [email or web address] Date: 09/17/2023 Prepared by: Hazeline Lister  Exercises - Seated Gastroc Stretch with Strap  - 1 x daily - 7 x weekly - 1 sets - 5 reps - Seated Self Great Toe Stretch  - 1 x daily - 7 x weekly - 1 sets - 5 reps - Foot Roller Plantar Massage  - 1 x daily - 7 x weekly - 1 sets - 5 reps  ASSESSMENT:  CLINICAL IMPRESSION: ***  Patient is a pleasant 43 y.o. male who was seen today for physical therapy evaluation and treatment for L foot pain.   Pt. Known to PT clinic and presents with chronic L plantar foot/ ankle pain.  Pt. Has h/o lymphedema swelling in L lower leg/ ankle.  Pt. Presents with L ankle weakness and pain with prolonged standing/walking tasks.  Pt. Instructed in the benefits of orthotics/ proper shoe wear.  Pt. Will benefit from short-term skilled PT services to develop stretching/ strengthening ex. Program and education to improve pain-free mobility.     OBJECTIVE IMPAIRMENTS: Abnormal gait, decreased activity tolerance, decreased balance, decreased mobility, difficulty walking, decreased ROM, decreased strength, impaired flexibility, improper body mechanics, obesity, and pain.   ACTIVITY LIMITATIONS: squatting and locomotion level  PARTICIPATION LIMITATIONS: community activity, occupation, and yard work  PERSONAL FACTORS: Fitness and Past/current experiences are also affecting patient's functional outcome.   REHAB POTENTIAL: Good  CLINICAL DECISION MAKING: Evolving/moderate complexity  EVALUATION COMPLEXITY: Moderate   GOALS: Goals reviewed with patient? Yes  SHORT TERM GOALS: Target date: 10/08/23 Pt. Independent with HEP to increase L ankle stability to WNL as compared to R to improve pain-free mobility.    Baseline:  see above Goal status: INITIAL   LONG TERM GOALS: Target date: 10/29/23  Pt. Will increase LEFS to >40 out of 80 to improve  pain-free mobility. Baseline: 29 out of 80 Goal status: INITIAL  2.  Pt. Will reports <3/10 L foot/ankle pain while completing job/ walking about mobile home park.   Baseline: >5/10 L foot pain currently Goal status: INITIAL  3.  Pt. Able to complete camping trip with [employer] with no L foot pain/ limitations.   Baseline:  increase L foot pain/limitations.  Goal status: INITIAL  PLAN:  PT FREQUENCY: 1x/week  PT DURATION: 6 weeks  PLANNED INTERVENTIONS: 97110-Therapeutic exercises, 97530- Therapeutic activity, 97112- Neuromuscular re-education, 97535- Self Care, 86578- Manual therapy, [phone]- Gait training, [phone]- Ionotophoresis 4mg /ml Dexamethasone , Patient/Family education, Balance training, Stair training, Taping, Dry Needling, Joint mobilization, Cryotherapy, and Moist heat  PLAN FOR NEXT SESSION: Reassess L plantar foot pain/ progress HEP    Janine Melbourne, PT, DPT Physical Therapist -   [facility]  09/30/2023, 3:47 PM

## 2023-09-30 NOTE — Unmapped (Signed)
 Patient is requesting the following refill  Requested Prescriptions     Pending Prescriptions Disp Refills    diphenoxylate -atropine  (LOMOTIL ) 2.5-0.025 mg per tablet 30 tablet 1     Sig: Take 1 tablet by mouth four (4) times a day as needed for diarrhea.       Recent Visits  Date Type Provider Dept   09/17/23 Office Visit Baldwin Bones, FNP [facility] [address] [facility]   08/08/23 Office Visit Baldwin Bones, FNP [facility] [address] [facility]   07/08/23 Office Visit Baldwin Bones, FNP [facility] [address] [facility]   06/25/23 Office Visit Baldwin Bones, FNP [facility] [address] [facility]   04/23/23 Office Visit Baldwin Bones, FNP [place] [facility] [address] [facility]   03/05/23 Office Visit Baldwin Bones, FNP [facility] [facility] [address] At [facility]   11/26/22 Office Visit Baldwin Bones, FNP [facility] [facility] [address] At [facility]   Showing recent visits within past 365 days and meeting all other requirements  Future Appointments  Date Type Provider Dept   10/08/23 Appointment Baldwin Bones, FNP [place] [facility] [address] [facility]   Showing future appointments within next 365 days and meeting all other requirements       Labs: Not applicable this refill

## 2023-10-01 ENCOUNTER — Ambulatory Visit

## 2023-10-01 ENCOUNTER — Encounter: Payer: Self-pay | Admitting: Physical Therapy

## 2023-10-01 DIAGNOSIS — M25672 Stiffness of left ankle, not elsewhere classified: Secondary | ICD-10-CM

## 2023-10-01 DIAGNOSIS — M79672 Pain in left foot: Secondary | ICD-10-CM | POA: Diagnosis not present

## 2023-10-01 DIAGNOSIS — M6281 Muscle weakness (generalized): Secondary | ICD-10-CM

## 2023-10-01 DIAGNOSIS — R269 Unspecified abnormalities of gait and mobility: Secondary | ICD-10-CM

## 2023-10-01 MED ORDER — DIPHENOXYLATE-ATROPINE 2.5 MG-0.025 MG TABLET
ORAL_TABLET | Freq: Four times a day (QID) | ORAL | 1 refills | 8.00 days | Status: CP | PRN
Start: 2023-10-01 — End: 2024-09-30

## 2023-10-01 NOTE — Unmapped (Signed)
 ERROR

## 2023-10-01 NOTE — Unmapped (Signed)
 [facility]    To patients reading this note: Please be advised that the primary purpose of this chart review is to improve the care you receive. Any medication recommendations are intended for your provider to consider upon further evaluation. Your provider may implement as appropriate.      Logan D Loranzo Desha. is a 43 y.o. male who was identified by the [facility] for a targeted diabetes management. A chart review was performed by a member of the [facility] team.     Current Outpatient Medications   Medication Sig Dispense Refill    adalimumab (HUMIRA,CF, PEN) 80 mg/0.8 mL PnKt Inject the contents of 1 pen (80 mg) subcutaneously once every week 4 each 11    albuterol 2.5 mg /3 mL (0.083 %) nebulizer solution Inhale 3 mL (2.5 mg total) by nebulization every four (4) hours as needed for wheezing. (Patient not taking: Reported on 08/21/2022) 180 mL 2    albuterol HFA 90 mcg/actuation inhaler Inhale 2 puffs every six (6) hours as needed for wheezing. (Patient not taking: Reported on 09/17/2023) 8.5 g 5    allopurinol (ZYLOPRIM) 300 MG tablet Take 1 tablet (300 mg total) by mouth daily. 90 tablet 3    ALPRAZolam (XANAX) 0.5 MG tablet Takes 1 tablet nightly and 1 tablet a day when needed      ascorbic acid (VITAMIN C ORAL) Take 1 capsule by mouth nightly.      atorvastatin (LIPITOR) 20 MG tablet Take 1 tablet (20 mg total) by mouth daily. 90 tablet 3    blood sugar diagnostic (ACCU-CHEK GUIDE TEST STRIPS) Strp by Other route Three (3) times a day before meals. 300 strip 3    blood-glucose meter kit Use as directed 1 each 0    budesonide-formoterol (SYMBICORT) 80-4.5 mcg/actuation inhaler Inhale 2 puffs two (2) times a day. 10.2 g 0    clindamycin (CLEOCIN T) 1 % lotion Apply topically two (2) times a day. To affected areas on body as needed for flares 60 mL 3    clobetasoL (TEMOVATE) 0.05 % ointment Apply daily to painful affected areas if needed for flares, then stop 15 g 1 clobetasol (TEMOVATE) 0.05 % ointment Apply to HS lesions twice daily for 5-7 days as needed for flares 60 g 1    colchicine (MITIGARE) 0.6 mg cap capsule Take 1 capsule (0.6 mg total) by mouth daily. (Patient not taking: Reported on 09/17/2023) 90 capsule 0    cyclobenzaprine (FLEXERIL) 10 MG tablet Take 1 tablet (10 mg total) by mouth Three (3) times a day as needed for muscle spasms. 60 tablet 1    dextroamphetamine sulfate (DEXTROSTAT) 10 MG tablet Take 3 tablets (30 mg total) by mouth two (2) times a day.      diclofenac (VOLTAREN) 75 MG EC tablet Take 1 tablet (75 mg total) by mouth two (2) times a day.      dicyclomine (BENTYL) 10 mg capsule Take 1 capsule (10 mg total) by mouth Four (4) times a day (before meals and nightly). 120 capsule 1    diphenoxylate-atropine (LOMOTIL) 2.5-0.025 mg per tablet Take 1 tablet by mouth four (4) times a day as needed for diarrhea. 30 tablet 1    dulaglutide 1.5 mg/0.5 mL PnIj Inject 0.5 mL (1.5 mg total) under the skin every seven (7) days. 2 mL 2    DULoxetine (CYMBALTA) 60 MG capsule Take 1 capsule (60 mg total) by mouth Two (2) times a day.  180 capsule 0    empty container Misc Use as directed to dispose of needles. When full, make sure lid is closed tightly then dispose of container in trash. 1 each 2    empty container Misc Use as directed to dispose of Cosentyx pens. 1 each 2    erenumab-aooe (AIMOVIG AUTOINJECTOR) auto-injector Inject 1 Pen under the skin every thirty (30) days. 3 mL 3    famotidine (PEPCID) 20 MG tablet Take 1 tablet (20 mg total) by mouth two (2) times a day. 180 tablet 3    fluconazole (DIFLUCAN) 150 MG tablet Take 150 mg now and repeat 150 mg in 5-7 days. 2 tablet 0    fluocinonide (LIDEX) 0.05 % gel Apply 1 Application topically Three (3) times a day. (Patient not taking: Reported on 09/17/2023)      fluoride, sodium, 1.1 % Pste       fluticasone propionate (FLONASE) 50 mcg/actuation nasal spray 1 spray into each nostril two (2) times a day. 16 g 11 glipiZIDE (GLUCOTROL) 10 MG tablet Take 2 tablets (20 mg total) by mouth Two (2) times a day (30 minutes before a meal). 360 tablet 3    HUMALOG U-100 INSULIN 100 unit/mL injection INJECT 120 UNITS UNDER THE SKIN DAILY VIA OMNIPOD 100 mL 3    hydrocortisone (PROCTOSOL HC) 2.5 % rectal cream Insert 1 Application into the rectum two (2) times a day as needed. (Patient not taking: Reported on 09/17/2023) 28.35 g 2    hydrOXYzine (ATARAX) 25 MG tablet Take 1 tablet (25 mg total) by mouth Three (3) times a day as needed. May take two before bed for sleep. 120 tablet 1    ibuprofen (ADVIL,MOTRIN) 800 MG tablet Take 1 tablet (800 mg total) by mouth every eight (8) hours as needed. (Patient not taking: Reported on 09/17/2023) 90 tablet 1    insulin glargine (LANTUS U-100 INSULIN) 100 unit/mL injection Inject 0.05 mL (5 Units total) under the skin nightly. (Patient taking differently: Inject 0.2 mL (20 Units total) under the skin nightly.) 6 mL 2    insulin syringe-needle U-100 (BD INSULIN SYRINGE ULTRA-FINE) 1 mL 31 gauge x 5/16 (8 mm) Syrg Once daily for insulin dosing. 100 each 1    insulin syringe-needle U-100 1 mL 31 gauge x 5/16 (8 mm) Syrg Once daily for insulin dosing. 100 each 3    lancets (ACCU-CHEK FASTCLIX LANCET DRUM) Misc Use to check blood sugars 3 times a day before meals or as directed 200 each 5    levothyroxine (SYNTHROID) 50 MCG tablet Take 1 tablet (50 mcg total) by mouth daily. 90 tablet 1    lidocaine (XYLOCAINE) 5 % ointment Apply topically Three (3) times a day as needed. 30 g 1    losartan (COZAAR) 50 MG tablet Take 1 tablet (50 mg total) by mouth daily. 90 tablet 3    montelukast (SINGULAIR) 10 mg tablet Take 1 tablet (10 mg total) by mouth daily. 90 tablet 3    multivitamin (MULTIPLE VITAMIN ORAL) Take 1 tablet by mouth daily.      nitrofurantoin, macrocrystal-monohydrate, (MACROBID) 100 MG capsule Take 1 capsule (100 mg total) by mouth every twelve (12) hours. 20 capsule 0    OMNIPOD 5 PACK PODS Omnipod 5 G6/G7 system POD (Gen 5) (5 PODS) Change POD every 48 hours 3 each 11    ondansetron (ZOFRAN) 4 MG tablet Take 1 tablet (4 mg total) by mouth every eight (8) hours as needed for nausea. 30 tablet  1    oxybutynin (DITROPAN) 5 MG tablet Take 1 tablet (5 mg total) by mouth two (2) times a day. 180 tablet 1    polyethylene glycol (GLYCOLAX) 17 gram/dose powder  (Patient not taking: Reported on 09/17/2023)      propranoloL (INDERAL LA) 120 mg 24 hr capsule Take 1 capsule (120 mg total) by mouth daily. 90 capsule 1    ramelteon (ROZEREM) 8 mg tablet Take 1 tablet (8 mg total) by mouth nightly.      safety needles (BD SAFETYGLIDE NEEDLE) 18 gauge x 1 1/2 Ndle USE ONE NEEDLE ONCE WEEKLY      secukinumab (COSENTYX UNOREADY PEN) 300 mg/2 mL PnIj Inject the contents of 1 pen (300 mg) under the skin every 28 days 2 mL 11    secukinumab (COSENTYX UNOREADY PEN) 300 mg/2 mL PnIj Inject the contents of 1 pen (300 mg) under the skin once weekly for 5 weeks (on weeks 0,1,2,3,4) for loading dose. 10 mL 0    sour cherry extract (TART CHERRY EXTRACT) 1,000 mg cap       syringe with needle (BD LUER-LOK SYRINGE) 3 mL 21 gauge x 1 1/2 Syrg To draw up testosterone once weekly 100 each 0    tadalafil (CIALIS) 5 MG tablet Take 1 tablet (5 mg total) by mouth in the morning. 90 tablet 3    testosterone cypionate (DEPOTESTOTERONE CYPIONATE) 200 mg/mL injection Inject 0.5 mL (100 mg total) into the muscle once a week. Inject 0.5cc (100mg ) weekly 2 mL 5    topiramate (TOPAMAX) 50 MG tablet Take 1.5 tablets (75 mg total) by mouth two (2) times a day. 270 tablet 3    traMADol (ULTRAM) 50 mg tablet Take 1 tablet (50 mg total) by mouth every six (6) hours. As needed for pain 20 tablet 0    WELLBUTRIN XL 150 mg 24 hr tablet Take 1 tablet (150 mg total) by mouth every morning.       No current facility-administered medications for this visit.       Diabetes  Current treatment: glipizide 10 mg, two BID , dulaglutide (Trulicity) 1.5 mg weekly (unclear why changed from 4.5 mg dose in past to Ozempic), insulin glargine (Lantus/Toujeo) nightly, insulin lispro (Humalog) via Omnipod    Previous treatment(s): metformin, GLP-1 agonists (Ozempic), and TZDs (Actos)     Lab Results   Component Value Date    A1C 9.4 (A) 07/08/2023     Currently uncontrolled. Goal A1c <7% per ADA guidelines.    Recommend increasing Trulicity to 3 mg weekly for additional glycemic lowering (if patient previously tolerated)  Alternatively, may consider switching to Mounjaro 2.5 mg weekly x4 weeks then increase to 5 mg weekly.  Would continue titration as tolerated  Would review amount of time patient is in automated mode (vs manual) with Omnipod; would consider referral to endocrine for additional support/adjustment if patient experiencing barriers to optimal use     Is the patient scheduled to be seen by provider within the next 2 weeks? yes  Future Appointments   Date Time Provider Department Center   10/08/2023  1:00 PM Baldwin Bones, FNP [facility]   10/16/2023  2:30 PM Anton Kirsten, PA [facility]   01/20/2024 10:00 AM Anton Kirsten, PA [facility]      Recommendations from chart review will be sent via Epic to Baldwin Bones, FNP.     Clark Crosser, PharmD, CPP  Clinical Pharmacist, [place] [facility]  [facility]: [phone] - Fax: [phone]

## 2023-10-01 NOTE — Unmapped (Signed)
 CPAP supply order faxed to [facility] 08/22/23 at 15:24.  Fax confirmation received.    P: [phone]  F: [phone]

## 2023-10-02 MED ORDER — TESTOSTERONE CYPIONATE 200 MG/ML INTRAMUSCULAR OIL
INTRAMUSCULAR | 5 refills | 28.00 days
Start: 2023-10-02 — End: ?

## 2023-10-03 DIAGNOSIS — E291 Testicular hypofunction: Principal | ICD-10-CM

## 2023-10-06 ENCOUNTER — Encounter: Payer: Self-pay | Admitting: Physical Therapy

## 2023-10-06 ENCOUNTER — Ambulatory Visit: Admitting: Physical Therapy

## 2023-10-06 ENCOUNTER — Ambulatory Visit: Admit: 2023-10-06 | Discharge: 2023-10-07 | Payer: Medicare (Managed Care)

## 2023-10-06 DIAGNOSIS — M6281 Muscle weakness (generalized): Secondary | ICD-10-CM

## 2023-10-06 DIAGNOSIS — R269 Unspecified abnormalities of gait and mobility: Secondary | ICD-10-CM

## 2023-10-06 DIAGNOSIS — M79672 Pain in left foot: Secondary | ICD-10-CM | POA: Diagnosis not present

## 2023-10-06 DIAGNOSIS — M25672 Stiffness of left ankle, not elsewhere classified: Secondary | ICD-10-CM

## 2023-10-06 LAB — TESTOSTERONE: TESTOSTERONE TOTAL: 178 ng/dL — ABNORMAL LOW (ref 197–670)

## 2023-10-06 LAB — ESTRADIOL(ESTROGEN) LEVEL: ESTRADIOL LEVEL: 69.1 pg/mL

## 2023-10-06 LAB — HEMATOCRIT: HEMATOCRIT: 49.4 % — ABNORMAL HIGH (ref 39.0–48.0)

## 2023-10-06 MED ORDER — TESTOSTERONE CYPIONATE 200 MG/ML INTRAMUSCULAR OIL
INTRAMUSCULAR | 0 refills | 28.00000 days | Status: CP
Start: 2023-10-06 — End: ?

## 2023-10-06 NOTE — Therapy (Signed)
 OUTPATIENT PHYSICAL THERAPY LOWER EXTREMITY TREATMENT  Patient Name: Jason Davidson MRN: [id number] DOB:October 02, 1980, 43 y.o., male Today's Date: 10/06/2023  END OF SESSION:  PT End of Session - 10/06/23 1200     Visit Number 3    Number of Visits 6    Date for PT Re-Evaluation 10/29/23    PT Start Time 1200    PT Stop Time 1241    PT Time Calculation (min) 41 min    Activity Tolerance Patient tolerated treatment well    Behavior During Therapy WFL for tasks assessed/performed              Past Medical History:  Diagnosis Date   Asthma    Depressed    Diabetes mellitus without complication (HCC)    Gout    IBS (irritable bowel syndrome)    Morbid obesity (HCC)    Sleep apnea    Thyroid disease    Past Surgical History:  Procedure Laterality Date   COLONOSCOPY     ESOPHAGOGASTRODUODENOSCOPY ENDOSCOPY     EYE SURGERY     LASER ABLATION INCOMPETENT VEIN INI     Patient Active Problem List   Diagnosis Date Noted   Sprain of left ankle 07/03/2022   Tinea pedis 03/18/2022   Chronic pansinusitis 11/28/2021   Cough variant asthma 11/28/2021   Environmental and seasonal allergies 11/28/2021   Erectile dysfunction 11/28/2021   Nasal congestion 11/28/2021   Sinus congestion 11/28/2021   Mild intermittent asthma without complication 10/11/2021   Essential hypertension 10/10/2021   Acquired hypothyroidism 01/09/2021   Acute non-recurrent pansinusitis 07/19/2020   Hidradenitis suppurativa 04/29/2018   Allergic rhinitis 12/23/2017   Varicose veins of both lower extremities with pain 10/16/2017   IBS (irritable bowel syndrome) 06/18/2017   Chronic venous insufficiency 12/31/2016   Lymphedema 12/31/2016   Pain in joint, ankle and foot 12/31/2016   Diabetes (HCC) 12/31/2016   GERD (gastroesophageal reflux disease) 12/31/2016   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   H/O retinal detachment 10/01/2016   No diabetic retinopathy in both eyes  10/01/2016   Cellulitis of right lower extremity 06/19/2016   Chronic joint pain 11/16/2015   Skin infection 05/15/2015   Depressed    Tick bite 10/20/2014   Primary insomnia 07/08/2014   Candidal balanitis 02/16/2014   Skin tag 01/17/2014   Left shoulder pain 10/04/2013   Anxiety 09/13/2013   Diarrhea 09/13/2013   Bleeding external hemorrhoids 09/03/2013   NAFLD (nonalcoholic fatty liver disease) 40/98/1191   Headache 07/05/2013   Abdominal pain 03/05/2013   Morbid (severe) obesity due to excess calories (HCC) 03/05/2013   Suicidal ideation 02/09/2013   Obstructive sleep apnea (adult) (pediatric) 07/21/2012   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    PCP: Laurence Pons, NP  REFERRING PROVIDER: Jeni Mitten, DPM  REFERRING DIAG: M72.2 (ICD-10-CM) - Plantar fasciitis of left foot   THERAPY DIAG:  Pain in left foot  Ankle joint stiffness, left  Muscle weakness (generalized)  Gait difficulty  Rationale for Evaluation and Treatment: Rehabilitation  ONSET DATE: Chronic  SUBJECTIVE:   SUBJECTIVE STATEMENT: Pt. Reports chronic h/o L foot/ ankle pain.  Pt. Had a fall over a year ago with L ankle issues.  Pt. Has over the counter orthotics and wearing New Balance shoes.  Pt. Received 2 injections to foot in past and occasional using ankle brace/ strap to mange pain symptoms.   PERTINENT HISTORY:   Subjective:  Patient ID: [name], male  DOB: 1981/04/20,  MRN: [id number]       Chief Complaint  Patient presents with   Plantar Fasciitis      "It's hurting."      43 y.o. male presents with the above complaint. History confirmed with patient.  He has been dealing with heel pain for some time and the injection he had at his last visit with Dr. Lara Plants was helpful, has worn off the meloxicam  did not help much, brace was helping as well as it broke recently and the strap was not working more   Objective:  Physical Exam: warm, good capillary refill, no trophic  changes or ulcerative lesions, normal DP and PT pulses, normal sensory exam, and pain on palpation of plantar medial heel at insertion of plantar fascia.     Radiographs: Previous x-rays taken December 24 showed no stress fracture or heel spur Assessment:    1. Plantar fasciitis of left foot         Plan:  Patient was evaluated and treated and all questions answered.   Discussed the etiology and treatment options for plantar fasciitis including stretching, formal physical therapy, supportive shoegears such as a running shoe or sneaker, pre fabricated orthoses, injection therapy, and oral medications. We also discussed the role of surgical treatment of this for patients who do not improve after exhausting non-surgical treatment options.     -Educated patient on stretching and icing of the affected limb -Plantar fascial brace dispensed again his previous brace failed.  Replacement was given today -Injection delivered to the plantar fascia of the left foot. -Rx for diclofenac . Educated on use, risks and benefits of the medication   After sterile prep with povidone-iodine solution and alcohol, the left heel was injected with 0.5cc 2% xylocaine  plain, 0.5cc 0.5% marcaine plain, 10mg  triamcinolone  acetonide, and 4mg  dexamethasone  was injected along the medial plantar fascia at the insertion on the plantar calcaneus. The patient tolerated the procedure well without complication.   Return if symptoms worsen or fail to improve.      PAIN:  Are you having pain? Yes: NPRS scale: 5/10 Pain location: L heel Pain description: sharp/ persistent Aggravating factors: walking Relieving factors: rest/ injections  PRECAUTIONS: Fall  RED FLAGS: None   WEIGHT BEARING RESTRICTIONS: No  FALLS:  Has patient fallen in last 6 months? 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  DIAGNOSTIC FINDINGS: CLINICAL DATA:  Fall 3 days ago with left ankle pain.   EXAM: LEFT ANKLE COMPLETE - 3+ VIEW   COMPARISON:  None Available.   FINDINGS: There is no evidence of fracture, dislocation, or joint effusion. There is no evidence of arthropathy or other focal bone abnormality. Soft tissues are unremarkable.  PATIENT SURVEYS:  LEFS 29 out of 80  COGNITION: Overall cognitive status: Within functional limits for tasks assessed     SENSATION: WFL  EDEMA:  L lower leg lymphadema  MUSCLE LENGTH: Hamstrings: limited B proximal/distal hamstring flexibility.  Thomas test: NT  POSTURE: rounded shoulders and forward head  PALPATION: (+) tenderness with palpation over L plantar aspect of foot (medial).  Good L great toe extension.    LOWER EXTREMITY ROM:  Active ROM Right eval Left  eval  Hip flexion    Hip extension    Hip abduction    Hip adduction    Hip internal rotation    Hip external rotation    Knee flexion [facility]  Knee extension [facility]  Ankle dorsiflexion 11 deg. 12 deg.  Ankle plantarflexion 50 deg. 58 deg.  Ankle inversion 30 deg. 30 deg.  Ankle eversion 22 deg. 20 deg.    (Blank rows = not tested)  LOWER EXTREMITY MMT:  MMT Right eval Left eval  Hip flexion    Hip extension    Hip abduction    Hip adduction    Hip internal rotation    Hip external rotation    Knee flexion 5 5  Knee extension 5 5  Ankle dorsiflexion 5 5  Ankle plantarflexion 4 4  Ankle inversion 4+ 4+  Ankle eversion 4+ 4+   (Blank rows = not tested)  LOWER EXTREMITY SPECIAL TESTS:  NT  FUNCTIONAL TESTS:  NT  GAIT: Distance walked: in clinic Assistive device utilized: None Level of assistance: Complete Independence Comments: L antalgic  gait pattern.  Discussed use of SPC to decrease L foot/heel wt. bearing                                                                                                                              TREATMENT DATE: 10/06/23  Subjective:  Pt. Reports L heel pain persists and c/o 9/10 pain with walking.  Pt. Entered PT with use of SPC and toe walking on L.  Pt. Planning to get New Balance 880 shoes after he gets paid at end of week.  Pt. Has limited pain relief with stretching/ice massage.   Pt. Has 3 day/2 night camping trip with [employer] this weekend in [place].    Manual: iASTM to L plantar fascia with tool x 15 minutes - tenderness noted to plantar fascia insertion on calcaneus STM to L plantar fascia x 6 minutes  Supine L gastroc/ great toe extension stretches with static holds during STM  Therex:   Seated great toe extension on L x 10  Seated heel/toe raises 2 x 20  Reviewed HEP.  Gait assessment before/after tx. Session.  Pt. Benefits from use of SPC at this time.     PATIENT EDUCATION:  Education details: HEP/ shoe wear/ Ice massage Person educated: Patient Education method: Explanation, Demonstration, and Handouts Education comprehension: verbalized understanding and returned demonstration  HOME EXERCISE PROGRAM: Access Code: [email or web address] URL: [email or web address] Date: 09/17/2023 Prepared by: Hazeline Lister  Exercises - Seated Gastroc Stretch with Strap  - 1 x daily - 7 x weekly - 1 sets - 5 reps - Seated Self Great Toe Stretch  - 1 x daily - 7 x weekly - 1 sets - 5 reps - Foot Roller Plantar Massage  - 1 x daily - 7 x weekly - 1 sets - 5 reps  ASSESSMENT:  CLINICAL IMPRESSION:   Session  focused on manual therapy with iASTM and STM to L plantar fascia as well as seated foot/ankle exercises. Tolerated session well with marked tenderness in L heel/ arch.  PT discussed the importance of proper supportive shoe wear/ orthotics with standing/walking.  Pt. Will  benefit from short-term skilled PT services to develop stretching/ strengthening ex. Program and education to improve pain-free mobility.     OBJECTIVE IMPAIRMENTS: Abnormal gait, decreased activity tolerance, decreased balance, decreased mobility, difficulty walking, decreased ROM, decreased strength, impaired flexibility, improper body mechanics, obesity, and pain.   ACTIVITY LIMITATIONS: squatting and locomotion level  PARTICIPATION LIMITATIONS: community activity, occupation, and yard work  PERSONAL FACTORS: Fitness and Past/current experiences are also affecting patient's functional outcome.   REHAB POTENTIAL: Good  CLINICAL DECISION MAKING: Evolving/moderate complexity  EVALUATION COMPLEXITY: Moderate   GOALS: Goals reviewed with patient? Yes  SHORT TERM GOALS: Target date: 10/08/23 Pt. Independent with HEP to increase L ankle stability to WNL as compared to R to improve pain-free mobility.   Baseline:  see above Goal status: Not met   LONG TERM GOALS: Target date: 10/29/23  Pt. Will increase LEFS to >40 out of 80 to improve pain-free mobility. Baseline: 29 out of 80 Goal status: INITIAL  2.  Pt. Will reports <3/10 L foot/ankle pain while completing job/ walking about mobile home park.   Baseline: >5/10 L foot pain currently Goal status: INITIAL  3.  Pt. Able to complete camping trip with [employer] with no L foot pain/ limitations.   Baseline:  increase L foot pain/limitations.  Goal status: INITIAL  PLAN:  PT FREQUENCY: 1x/week  PT DURATION: 6 weeks  PLANNED INTERVENTIONS: 97110-Therapeutic exercises, 97530- Therapeutic activity, 97112- Neuromuscular re-education, 97535- Self Care, 16109- Manual therapy, [phone]- Gait training, [phone]- Ionotophoresis 4mg /ml Dexamethasone , Patient/Family education, Balance training, Stair training, Taping, Dry Needling, Joint mobilization, Cryotherapy, and Moist heat  PLAN FOR NEXT SESSION: Reassess L plantar foot pain/ progress  HEP    Lendell Quarry, PT, DPT # [phone] Physical Therapist - [facility]  10/06/2023, 7:53 PM

## 2023-10-06 NOTE — Unmapped (Signed)
 Scheduled and he knows to do labs before

## 2023-10-06 NOTE — Unmapped (Signed)
 Data reviewed. BS remains consistently high.   Logan Quinn

## 2023-10-08 ENCOUNTER — Encounter: Admitting: Physical Therapy

## 2023-10-08 ENCOUNTER — Ambulatory Visit: Admit: 2023-10-08 | Discharge: 2023-10-09 | Payer: Medicare (Managed Care) | Attending: Family | Primary: Family

## 2023-10-08 DIAGNOSIS — E1165 Type 2 diabetes mellitus with hyperglycemia: Principal | ICD-10-CM

## 2023-10-08 DIAGNOSIS — L732 Hidradenitis suppurativa: Principal | ICD-10-CM

## 2023-10-08 DIAGNOSIS — Z794 Long term (current) use of insulin: Principal | ICD-10-CM

## 2023-10-08 DIAGNOSIS — B3749 Other urogenital candidiasis: Principal | ICD-10-CM

## 2023-10-08 DIAGNOSIS — I10 Essential (primary) hypertension: Principal | ICD-10-CM

## 2023-10-08 DIAGNOSIS — E039 Hypothyroidism, unspecified: Principal | ICD-10-CM

## 2023-10-08 DIAGNOSIS — J4520 Mild intermittent asthma, uncomplicated: Principal | ICD-10-CM

## 2023-10-08 DIAGNOSIS — K08409 Partial loss of teeth, unspecified cause, unspecified class: Principal | ICD-10-CM

## 2023-10-08 LAB — LIPID PANEL
CHOLESTEROL: 249 mg/dL — ABNORMAL HIGH (ref <200)
HDL CHOLESTEROL: 54 mg/dL (ref >40)
LDL CHOLESTEROL CALCULATED: 169 mg/dL — ABNORMAL HIGH (ref <100)
NON-HDL CHOLESTEROL: 195 mg/dL — ABNORMAL HIGH (ref <130)
TRIGLYCERIDES: 138 mg/dL (ref <150)

## 2023-10-08 MED ORDER — DULAGLUTIDE 3 MG/0.5 ML SUBCUTANEOUS PEN INJECTOR
SUBCUTANEOUS | 2 refills | 28.00000 days | Status: CP
Start: 2023-10-08 — End: ?

## 2023-10-08 MED ORDER — TESTOSTERONE CYPIONATE 200 MG/ML INTRAMUSCULAR OIL
INTRAMUSCULAR | 0 refills | 28.00000 days | Status: CP
Start: 2023-10-08 — End: ?

## 2023-10-08 MED ORDER — INSULIN SYRINGE U-100 WITH NEEDLE 1 ML 31 GAUGE X 5/16" (8 MM)
SUBCUTANEOUS | 3 refills | 0.00000 days | Status: CP
Start: 2023-10-08 — End: 2024-10-07

## 2023-10-08 NOTE — Therapy (Signed)
 OUTPATIENT PHYSICAL THERAPY LOWER EXTREMITY TREATMENT  Patient Name: Jason Davidson MRN: [id number] DOB:18-Jul-1980, 43 y.o., male Today's Date: 10/09/2023  END OF SESSION:  PT End of Session - 10/09/23 1519     Visit Number 4    Number of Visits 6    Date for PT Re-Evaluation 10/29/23    PT Start Time 1517    PT Stop Time 1558    PT Time Calculation (min) 41 min    Activity Tolerance Patient tolerated treatment well    Behavior During Therapy WFL for tasks assessed/performed               Past Medical History:  Diagnosis Date   Asthma    Depressed    Diabetes mellitus without complication (HCC)    Gout    IBS (irritable bowel syndrome)    Morbid obesity (HCC)    Sleep apnea    Thyroid disease    Past Surgical History:  Procedure Laterality Date   COLONOSCOPY     ESOPHAGOGASTRODUODENOSCOPY ENDOSCOPY     EYE SURGERY     LASER ABLATION INCOMPETENT VEIN INI     Patient Active Problem List   Diagnosis Date Noted   Sprain of left ankle 07/03/2022   Tinea pedis 03/18/2022   Chronic pansinusitis 11/28/2021   Cough variant asthma 11/28/2021   Environmental and seasonal allergies 11/28/2021   Erectile dysfunction 11/28/2021   Nasal congestion 11/28/2021   Sinus congestion 11/28/2021   Mild intermittent asthma without complication 10/11/2021   Essential hypertension 10/10/2021   Acquired hypothyroidism 01/09/2021   Acute non-recurrent pansinusitis 07/19/2020   Hidradenitis suppurativa 04/29/2018   Allergic rhinitis 12/23/2017   Varicose veins of both lower extremities with pain 10/16/2017   IBS (irritable bowel syndrome) 06/18/2017   Chronic venous insufficiency 12/31/2016   Lymphedema 12/31/2016   Pain in joint, ankle and foot 12/31/2016   Diabetes (HCC) 12/31/2016   GERD (gastroesophageal reflux disease) 12/31/2016   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   H/O retinal detachment 10/01/2016   No diabetic retinopathy in both eyes  10/01/2016   Cellulitis of right lower extremity 06/19/2016   Chronic joint pain 11/16/2015   Skin infection 05/15/2015   Depressed    Tick bite 10/20/2014   Primary insomnia 07/08/2014   Candidal balanitis 02/16/2014   Skin tag 01/17/2014   Left shoulder pain 10/04/2013   Anxiety 09/13/2013   Diarrhea 09/13/2013   Bleeding external hemorrhoids 09/03/2013   NAFLD (nonalcoholic fatty liver disease) 63/87/5643   Headache 07/05/2013   Abdominal pain 03/05/2013   Morbid (severe) obesity due to excess calories (HCC) 03/05/2013   Suicidal ideation 02/09/2013   Obstructive sleep apnea (adult) (pediatric) 07/21/2012   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    PCP: Jason Pons, NP  REFERRING PROVIDER: Jeni Davidson, DPM  REFERRING DIAG: M72.2 (ICD-10-CM) - Plantar fasciitis of left foot   THERAPY DIAG:  Gait difficulty  Pain in left foot  Ankle joint stiffness, left  Muscle weakness (generalized)  Rationale for Evaluation and Treatment: Rehabilitation  ONSET DATE: Chronic  SUBJECTIVE:   SUBJECTIVE STATEMENT: Pt. Reports chronic h/o L foot/ ankle pain.  Pt. Had a fall over a year ago with L ankle issues.  Pt. Has over the counter orthotics and wearing [employer] shoes.  Pt. Received 2 injections to foot in past and occasional using ankle brace/ strap to mange pain symptoms.   PERTINENT HISTORY:   Subjective:  Patient ID: Jason Davidson, male  DOB: [birth year],  MRN: [id number]       Chief Complaint  Patient presents with   Plantar Fasciitis      "It's hurting."      43 y.o. male presents with the above complaint. History confirmed with patient.  He has been dealing with heel pain for some time and the injection he had at his last visit with Jason Davidson was helpful, has worn off the meloxicam  did not help much, brace was helping as well as it broke recently and the strap was not working more   Objective:  Physical Exam: warm, good capillary refill, no trophic  changes or ulcerative lesions, normal DP and PT pulses, normal sensory exam, and pain on palpation of plantar medial heel at insertion of plantar fascia.     Radiographs: Previous x-rays taken December 24 showed no stress fracture or heel spur Assessment:    1. Plantar fasciitis of left foot         Plan:  Patient was evaluated and treated and all questions answered.   Discussed the etiology and treatment options for plantar fasciitis including stretching, formal physical therapy, supportive shoegears such as a running shoe or sneaker, pre fabricated orthoses, injection therapy, and oral medications. We also discussed the role of surgical treatment of this for patients who do not improve after exhausting non-surgical treatment options.     -Educated patient on stretching and icing of the affected limb -Plantar fascial brace dispensed again his previous brace failed.  Replacement was given today -Injection delivered to the plantar fascia of the left foot. -Rx for diclofenac . Educated on use, risks and benefits of the medication   After sterile prep with povidone-iodine solution and alcohol, the left heel was injected with 0.5cc 2% xylocaine  plain, 0.5cc 0.5% marcaine plain, 10mg  triamcinolone  acetonide, and 4mg  dexamethasone  was injected along the medial plantar fascia at the insertion on the plantar calcaneus. The patient tolerated the procedure well without complication.   Return if symptoms worsen or fail to improve.      PAIN:  Are you having pain? Yes: NPRS scale: 5/10 Pain location: L heel Pain description: sharp/ persistent Aggravating factors: walking Relieving factors: rest/ injections  PRECAUTIONS: Fall  RED FLAGS: None   WEIGHT BEARING RESTRICTIONS: No  FALLS:  Has patient fallen in last 6 months? 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  DIAGNOSTIC FINDINGS: CLINICAL DATA:  Fall 3 days ago with left ankle pain.   EXAM: LEFT ANKLE COMPLETE - 3+ VIEW   COMPARISON:  None Available.   FINDINGS: There is no evidence of fracture, dislocation, or joint effusion. There is no evidence of arthropathy or other focal bone abnormality. Soft tissues are unremarkable.  PATIENT SURVEYS:  LEFS 29 out of 80  COGNITION: Overall cognitive status: Within functional limits for tasks assessed     SENSATION: WFL  EDEMA:  L lower leg lymphadema  MUSCLE LENGTH: Hamstrings: limited B proximal/distal hamstring flexibility.  Thomas test: NT  POSTURE: rounded shoulders and forward head  PALPATION: (+) tenderness with palpation over L plantar aspect of foot (medial).  Good L great toe extension.    LOWER EXTREMITY ROM:  Active ROM Right eval Left  eval  Hip flexion    Hip extension    Hip abduction    Hip adduction    Hip internal rotation    Hip external rotation    Knee flexion [facility]  Knee extension [facility]  Ankle dorsiflexion 11 deg. 12 deg.  Ankle plantarflexion 50 deg. 58 deg.  Ankle inversion 30 deg. 30 deg.  Ankle eversion 22 deg. 20 deg.    (Blank rows = not tested)  LOWER EXTREMITY MMT:  MMT Right eval Left eval  Hip flexion    Hip extension    Hip abduction    Hip adduction    Hip internal rotation    Hip external rotation    Knee flexion 5 5  Knee extension 5 5  Ankle dorsiflexion 5 5  Ankle plantarflexion 4 4  Ankle inversion 4+ 4+  Ankle eversion 4+ 4+   (Blank rows = not tested)  LOWER EXTREMITY SPECIAL TESTS:  NT  FUNCTIONAL TESTS:  NT  GAIT: Distance walked: in clinic Assistive device utilized: None Level of assistance: Complete Independence Comments: L antalgic  gait pattern.  Discussed use of SPC to decrease L foot/heel wt. bearing                                                                                                                              TREATMENT DATE: 10/09/23   Subjective:  Patient reports minimal pain on arrival due to recently taking Percocet for his mouth from molar extraction on Monday 10/06/23. Hopes to order the new shoes tomorrow.   Manual: iASTM to L plantar fascia with tool x 15 minutes - tenderness noted to plantar fascia insertion on calcaneus STM to L plantar fascia x 6 minutes  Supine L gastroc/ great toe extension stretches with static holds during STM  Therex:  Seated gastroc stretch with strap 3 x 30 seconds Seated towel scrunches 2 x 25  Seated great toe extension on L 2 x 10  Seated heel/toe raises 2 x 20  Seated L ankle inv/ev 2 x 20    PATIENT EDUCATION:  Education details: HEP/ shoe wear/ Ice massage Person educated: Patient Education method: Explanation, Demonstration, and Handouts Education comprehension: verbalized understanding and returned demonstration  HOME EXERCISE PROGRAM: Access Code: [email or web address] URL: [email or web address] Date: 09/17/2023 Prepared by: Hazeline Lister  Exercises - Seated Gastroc Stretch with Strap  - 1 x daily - 7 x weekly - 1 sets - 5 reps - Seated Self Great Toe Stretch  - 1 x daily - 7 x weekly - 1 sets - 5 reps - Foot Roller Plantar Massage  - 1 x daily - 7 x weekly - 1 sets - 5 reps  ASSESSMENT:  CLINICAL IMPRESSION:    Session focused on manual therapy with iASTM and STM to L plantar fascia as well as seated foot/ankle exercises. Tolerated session well with marked tenderness in L heel/ arch.  Continued discussion about proper supportive footwear. Pt.  Will benefit from short-term skilled PT services to develop stretching/ strengthening ex. Program and education to improve pain-free mobility.     OBJECTIVE IMPAIRMENTS: Abnormal gait, decreased activity  tolerance, decreased balance, decreased mobility, difficulty walking, decreased ROM, decreased strength, impaired flexibility, improper body mechanics, obesity, and pain.   ACTIVITY LIMITATIONS: squatting and locomotion level  PARTICIPATION LIMITATIONS: community activity, occupation, and yard work  PERSONAL FACTORS: Fitness and Past/current experiences are also affecting patient's functional outcome.   REHAB POTENTIAL: Good  CLINICAL DECISION MAKING: Evolving/moderate complexity  EVALUATION COMPLEXITY: Moderate   GOALS: Goals reviewed with patient? Yes  SHORT TERM GOALS: Target date: 10/08/23 Pt. Independent with HEP to increase L ankle stability to WNL as compared to R to improve pain-free mobility.   Baseline:  see above Goal status: Not met   LONG TERM GOALS: Target date: 10/29/23  Pt. Will increase LEFS to >40 out of 80 to improve pain-free mobility. Baseline: 29 out of 80 Goal status: INITIAL  2.  Pt. Will reports <3/10 L foot/ankle pain while completing job/ walking about mobile home park.   Baseline: >5/10 L foot pain currently Goal status: INITIAL  3.  Pt. Able to complete camping trip with [employer] with no L foot pain/ limitations.   Baseline:  increase L foot pain/limitations.  Goal status: INITIAL  PLAN:  PT FREQUENCY: 1x/week  PT DURATION: 6 weeks  PLANNED INTERVENTIONS: 97110-Therapeutic exercises, 97530- Therapeutic activity, 97112- Neuromuscular re-education, 97535- Self Care, 28413- Manual therapy, [phone]- Gait training, [phone]- Ionotophoresis 4mg /ml Dexamethasone , Patient/Family education, Balance training, Stair training, Taping, Dry Needling, Joint mobilization, Cryotherapy, and Moist heat  PLAN FOR NEXT SESSION: Reassess L plantar foot pain/ progress HEP    Janine Melbourne, PT, DPT  Physical Therapist - [place]  [facility]  10/09/2023, 3:37 PM

## 2023-10-08 NOTE — Unmapped (Signed)
 ERROR

## 2023-10-08 NOTE — Unmapped (Signed)
 Assessment and Plan:     Assessment & Plan    Type 2 Diabetes Mellitus with Hyperglycemia  Recent hyperglycemia likely due to steroid use. Current medications include insulin glargine and dulaglutide.  - Increase dulaglutide to 3 mg for four weeks.  - Increase insulin glargine to 22-25 units at bedtime, max 30 units if needed.  - Monitor blood glucose levels closely, especially fasting levels.  - Continue to update provider with continuous glucose monitor readings.    Hypertension  Blood pressure elevated likely due to pain and steroid use. Previously controlled with losartan 50 mg daily.  - Monitor blood pressure at home twice daily for a week.  - Reassess blood pressure in one week via phone call.  - Consider increasing losartan if blood pressure remains elevated.    Asthma  Well-managed, no recent exacerbations. Pulmonologist deferred management to primary care.  - Continue budesonide/formoterol as needed for flares.  - Ensure albuterol inhaler is available for immediate relief if needed.    Tooth Extraction with Complications  Recent extraction with complications, inadequate pain control, potential bone graft issues.  - Await dentist's response regarding pain medication by 4 PM.  - Consider prescribing acetaminophen with codeine if no response from dentist.      Hidradenitis Suppurativa  Managed with secukinumab, previous adalimumab ineffective. Recent yeast infections possibly related to treatment and hyperglycemia.  - Continue secukinumab as prescribed.  - Monitor for yeast infections and treat as necessary.                 Type 2 diabetes mellitus with hyperglycemia, with long-term current use of insulin  - POCT glycosylated hemoglobin (Hb A1C)  - Lipid Panel  - dulaglutide (TRULICITY) 3 mg/0.5 mL injection pen; Inject 0.5 mL (3 mg total) under the skin every seven (7) days.    Essential hypertension  - Lipid Panel    Mild intermittent asthma without complication      Acquired hypothyroidism      Hidradenitis suppurativa      Status post tooth extraction      Genital candidiasis in male             At onset of visit I advised patient I am using a new tool to record our discussion today. The recording would write down what we discuss and once reviewed and finalized would become a part of the chart note for today's visit. Patient amenable to recording.     I personally spent 40 minutes face-to-face and non-face-to-face in the care of this patient, which includes all pre, intra, and post visit time on the date of service.    Return in about 3 months (around 01/07/2024) for Next scheduled follow up.    HPI:      Logan Kelderman. is here for   Chief Complaint   Patient presents with    Diabetes     He will schedule his diabetic eye exam.    Hypertension    Asthma    Hypothyroidism    Immunizations     Discussed TDAP.              History of Present Illness      Answers submitted by the patient for this visit:  Diabetes Questionnaire (Submitted on 10/01/2023)  Chief Complaint: Diabetes problem  Diabetes type: type 2  MedicAlert ID: [id number]  Disease duration: 11 Years  blurred vision: No  chest pain: No  fatigue: No  foot paresthesias: No  foot  ulcerations: No  polydipsia: Yes  polyphagia: No  polyuria: Yes  visual change: No  weakness: No  weight loss: No  Symptom course: improving  confusion: No  speech difficulty: No  dizziness: No  nervous/anxious: No  headaches: No  hunger: No  mood changes: No  pallor: No  seizures: No  tremors: No  sleepiness: No  sweats: No  blackouts: No  hospitalization: No  nocturnal hypoglycemia: No  required assistance: No  required glucagon: No  CVA: No  heart disease: No  impotence: Yes  nephropathy: No  peripheral neuropathy: No  PVD: No  retinopathy: No  CAD risks: dyslipidemia, family history, hypertension, obesity  Current treatments: insulin pump, oral agent (dual therapy)  Treatment compliance: most of the time  Dose schedule: at bedtime  Given by: patient, significant other  Injection sites: abdominal wall, arms, thighs  Home blood tests: 5+ x per day  Monitoring compliance: fair  Blood glucose trend: increasing steadily  breakfast time: 7-8 am  breakfast glucose level: >200  lunch time: 12-1 pm  lunch glucose level: >200  dinner time: 7-8 pm  dinner glucose level: >200  Bedtime: 10-11 pm  Bedtime glucose level: >200  High score: >200  Overall: >200  Weight trend: fluctuating minimally  Current diet: diabetic  Meal planning: avoidance of concentrated sweets, calorie counting, carbohydrate counting  Exercise: intermittently  Dietitian visit: Yes  Eye exam current: No  Sees podiatrist: Yes        Logan D Arash Heap. is a 43 year old male with hypertension and diabetes who presents with elevated blood pressure and blood sugar levels following a recent tooth extraction.    He has elevated blood pressure, recorded at 162/104 mmHg today, following a recent tooth extraction on Monday. Historically, his blood pressure has been lower, with readings of 140/70 mmHg in April and 130/76 mmHg in February. The pain from the extraction, which required four stitches, might be contributing to the elevated readings. He is currently taking Tylenol and ibuprofen for pain, but finds them ineffective.    His blood sugar levels have been high, with recent averages in the 300s, although a recent reading was 166 mg/dL. His A1c is currently 10.4%. He was on prednisone for 2-3 weeks for hidradenitis suppurativa and received triamcinolone and dexamethasone injections in his heel, which may have contributed to elevated blood sugar levels. He is currently on Lantus 20 units at bedtime and Trulicity 1.5 mg, with a recent change to Cosentyx for hidradenitis suppurativa.    He has a history of asthma, for which he uses Symbicort as needed and has an albuterol inhaler that he rarely uses. No recent exacerbations. His last thyroid test in September 2024 showed stable TSH levels at 1.937.    He mentions a recent change in his vehicle, which has a better air conditioner, possibly affecting his perception of cold. He has not had a recent cholesterol panel since August 2023 and has not had a recent eye exam. He is due for a tetanus booster, especially considering his involvement with scouts and upcoming camp activities.     ROS:      Comprehensive 10 point ROS negative unless otherwise stated in the HPI.      [facility] Components:     Medication adherence and barriers to the treatment plan have been addressed. Opportunities to optimize healthy behaviors have been discussed. Patient / caregiver voiced understanding.    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    Liver disease     Migraines     Mild intermittent asthma without complication 10/11/2021    Morbid obesity with BMI of 60.0-69.9, adult (CMS-HCC)     Neuropathy in diabetes     Obstructive sleep apnea     OSA on CPAP     Retinal detachment 04/2014    Severe obstructive sleep apnea     Trapezius muscle strain 12/07/2013    Urinary incontinence, nocturnal enuresis     Venous insufficiency      Past Surgical History:   Procedure Laterality Date    COLONOSCOPY      CYSTOSCOPY      EYE SURGERY  04/2014    LASER ABLATION INCOMPETENT VEIN [facility] HISTORICAL RESULT) Right     PR COLONOSCOPY FLX DX W/COLLJ SPEC WHEN PFRMD N/A 03/24/2013    Procedure: COLONOSCOPY, FLEXIBLE, PROXIMAL TO SPLENIC FLEXURE; DIAGNOSTIC, W/WO COLLECTION SPECIMEN BY BRUSH OR WASH;  Surgeon: Beatriz Bouillon, MD;  Location: [facility];  Service: Gastroenterology    PR EYE SURG POST SGMT PROC UNLISTED Left     pneumatic retinopexy OS    PR UPPER GI ENDOSCOPY,DIAGNOSIS N/A 02/02/2013    Procedure: UGI ENDO, INCLUDE ESOPHAGUS, STOMACH, & DUODENUM &/OR JEJUNUM; DX W/WO COLLECTION SPECIMN, BY BRUSH OR WASH;  Surgeon: Aloha Jakes, MD;  Location: [facility];  Service: Gastroenterology    SKIN BIOPSY      TOOTH EXTRACTION  10/06/2023    UPPER GASTROINTESTINAL ENDOSCOPY      US  PYLORIC STENOSIS ([place] HISTORICAL RESULT)         Family History:     Family History   Problem Relation Age of Onset    Cancer Maternal Grandfather         Stomach Cancer    Hearing loss Maternal Grandfather     GER disease Maternal Grandfather     Cancer Paternal Grandfather         Bone Cancer, Lung Cancer    COPD Paternal Grandmother     Arthritis Paternal Grandmother     Depression Paternal Grandmother     Osteoporosis Paternal Grandmother     Diabetes Mother     Heart disease Mother     Migraines Mother     Arthritis Mother     Depression Mother     GER disease Mother     Hypertension Mother     Angina Mother     COPD Mother     Glaucoma Mother     Hearing loss Mother     Cataracts Mother     Stroke Mother     Miscarriages / [place] Mother     Diabetes Sister     Migraines Sister     Asthma Sister     Depression Sister     Hearing loss Sister     Diabetes Brother     Asthma Brother     Diabetes Maternal Grandmother  Heart disease Maternal Grandmother     Migraines Maternal Grandmother     Depression Maternal Grandmother     Angina Maternal Grandmother     Hypertension Maternal Grandmother     Stroke Maternal Grandmother     GER disease Maternal Grandmother     Diabetes Maternal Uncle     Hearing loss Maternal Uncle     Diabetes Maternal Uncle         resulted in need for kidney transplant    Liver disease Maternal Uncle     Kidney disease Maternal Uncle         needed kidney transplant    Depression Maternal Uncle     Asthma Brother     Gout Father     No Known Problems Paternal Aunt     No Known Problems Paternal Uncle     No Known Problems Other     Diabetes Sister     Asthma Sister Depression Sister     Hearing loss Sister     Migraines Sister     Diabetes Brother     Asthma Brother     Broken bones Brother     Diabetes Maternal Uncle     Hearing loss Maternal Uncle     Asthma Brother     Colorectal Cancer Neg Hx     Esophageal cancer Neg Hx     Liver cancer Neg Hx     Pancreatic cancer Neg Hx     Stomach cancer Neg Hx     Amblyopia Neg Hx     Blindness Neg Hx     Retinal detachment Neg Hx     Strabismus Neg Hx     Macular degeneration Neg Hx     Anesthesia problems Neg Hx     Clotting disorder Neg Hx     Collagen disease Neg Hx     Dislocations Neg Hx     Fibromyalgia Neg Hx     Hemophilia Neg Hx     Rheumatologic disease Neg Hx     Scoliosis Neg Hx     Severe sprains Neg Hx     Sickle cell anemia Neg Hx     Spinal Compression Fracture Neg Hx     Melanoma Neg Hx     Basal cell carcinoma Neg Hx     Squamous cell carcinoma Neg Hx     Deep vein thrombosis Neg Hx     Thyroid disease Neg Hx        Social History:     Social History     Tobacco Use    Smoking status: Never     Passive exposure: Never    Smokeless tobacco: Never   Vaping Use    Vaping status: Never Used   Substance Use Topics    Alcohol use: Never     Comment: rare social    Drug use: Never       Allergies:     Erythromycin, Penicillins, Sulfa (sulfonamide antibiotics), Other, and Metronidazole    Current Medications:     Current Outpatient Medications   Medication Sig Dispense Refill    allopurinol (ZYLOPRIM) 300 MG tablet Take 1 tablet (300 mg total) by mouth daily. 90 tablet 3    ALPRAZolam (XANAX) 0.5 MG tablet Takes 1 tablet nightly and 1 tablet a day when needed      ascorbic acid (VITAMIN C ORAL) Take 1 capsule by mouth nightly.      atorvastatin (LIPITOR) 20  MG tablet Take 1 tablet (20 mg total) by mouth daily. 90 tablet 3    blood sugar diagnostic (ACCU-CHEK GUIDE TEST STRIPS) Strp by Other route Three (3) times a day before meals. 300 strip 3    blood-glucose meter kit Use as directed 1 each 0    budesonide-formoterol (SYMBICORT) 80-4.5 mcg/actuation inhaler Inhale 2 puffs two (2) times a day. 10.2 g 0    clindamycin (CLEOCIN T) 1 % lotion Apply topically two (2) times a day. To affected areas on body as needed for flares 60 mL 3    clindamycin (CLEOCIN) 150 MG capsule Take 1 capsule (150 mg total) by mouth Three (3) times a day.      clobetasoL (TEMOVATE) 0.05 % ointment Apply daily to painful affected areas if needed for flares, then stop 15 g 1    clobetasol (TEMOVATE) 0.05 % ointment Apply to HS lesions twice daily for 5-7 days as needed for flares 60 g 1    cyclobenzaprine (FLEXERIL) 10 MG tablet Take 1 tablet (10 mg total) by mouth Three (3) times a day as needed for muscle spasms. 60 tablet 1    dextroamphetamine sulfate (DEXTROSTAT) 10 MG tablet Take 3 tablets (30 mg total) by mouth two (2) times a day.      diclofenac (VOLTAREN) 75 MG EC tablet Take 1 tablet (75 mg total) by mouth two (2) times a day.      dicyclomine (BENTYL) 10 mg capsule Take 1 capsule (10 mg total) by mouth Four (4) times a day (before meals and nightly). 120 capsule 1    dulaglutide 1.5 mg/0.5 mL PnIj Inject 0.5 mL (1.5 mg total) under the skin every seven (7) days. 2 mL 2    DULoxetine (CYMBALTA) 60 MG capsule Take 1 capsule (60 mg total) by mouth Two (2) times a day. 180 capsule 0    empty container Misc Use as directed to dispose of needles. When full, make sure lid is closed tightly then dispose of container in trash. 1 each 2    empty container Misc Use as directed to dispose of Cosentyx pens. 1 each 2    erenumab-aooe (AIMOVIG AUTOINJECTOR) auto-injector Inject 1 Pen under the skin every thirty (30) days. 3 mL 3    famotidine (PEPCID) 20 MG tablet Take 1 tablet (20 mg total) by mouth two (2) times a day. 180 tablet 3    fluconazole (DIFLUCAN) 150 MG tablet Take 150 mg now and repeat 150 mg in 5-7 days. 2 tablet 0    fluocinonide (LIDEX) 0.05 % gel Apply 1 Application topically Three (3) times a day.      fluoride, sodium, 1.1 % Pste fluticasone propionate (FLONASE) 50 mcg/actuation nasal spray 1 spray into each nostril two (2) times a day. 16 g 11    glipiZIDE (GLUCOTROL) 10 MG tablet Take 2 tablets (20 mg total) by mouth Two (2) times a day (30 minutes before a meal). 360 tablet 3    HUMALOG U-100 INSULIN 100 unit/mL injection INJECT 120 UNITS UNDER THE SKIN DAILY VIA OMNIPOD 100 mL 3    hydrOXYzine (ATARAX) 25 MG tablet Take 1 tablet (25 mg total) by mouth Three (3) times a day as needed. May take two before bed for sleep. 120 tablet 1    ibuprofen (ADVIL,MOTRIN) 800 MG tablet Take 1 tablet (800 mg total) by mouth every eight (8) hours as needed. 90 tablet 1    insulin glargine (LANTUS U-100 INSULIN) 100 unit/mL injection Inject  0.05 mL (5 Units total) under the skin nightly. (Patient taking differently: Inject 0.2 mL (20 Units total) under the skin nightly.) 6 mL 2    insulin syringe-needle U-100 (BD INSULIN SYRINGE ULTRA-FINE) 1 mL 31 gauge x 5/16 (8 mm) Syrg Once daily for insulin dosing. 100 each 1    insulin syringe-needle U-100 1 mL 31 gauge x 5/16 (8 mm) Syrg Once daily for insulin dosing. 100 each 3    lancets (ACCU-CHEK FASTCLIX LANCET DRUM) Misc Use to check blood sugars 3 times a day before meals or as directed 200 each 5    levothyroxine (SYNTHROID) 50 MCG tablet Take 1 tablet (50 mcg total) by mouth daily. 90 tablet 1    lidocaine (XYLOCAINE) 5 % ointment Apply topically Three (3) times a day as needed. 30 g 1    losartan (COZAAR) 50 MG tablet Take 1 tablet (50 mg total) by mouth daily. 90 tablet 3    montelukast (SINGULAIR) 10 mg tablet Take 1 tablet (10 mg total) by mouth daily. 90 tablet 3    multivitamin (MULTIPLE VITAMIN ORAL) Take 1 tablet by mouth daily.      OMNIPOD 5 PACK PODS Omnipod 5 G6/G7 system POD (Gen 5) (5 PODS) Change POD every 48 hours 3 each 11    ondansetron (ZOFRAN) 4 MG tablet Take 1 tablet (4 mg total) by mouth every eight (8) hours as needed for nausea. 30 tablet 1    oxybutynin (DITROPAN) 5 MG tablet Take 1 tablet (5 mg total) by mouth two (2) times a day. 180 tablet 1    propranoloL (INDERAL LA) 120 mg 24 hr capsule Take 1 capsule (120 mg total) by mouth daily. 90 capsule 1    ramelteon (ROZEREM) 8 mg tablet Take 1 tablet (8 mg total) by mouth nightly.      safety needles (BD SAFETYGLIDE NEEDLE) 18 gauge x 1 1/2 Ndle USE ONE NEEDLE ONCE WEEKLY      secukinumab (COSENTYX UNOREADY PEN) 300 mg/2 mL PnIj Inject the contents of 1 pen (300 mg) under the skin once weekly for 5 weeks (on weeks 0,1,2,3,4) for loading dose. 10 mL 0    sour cherry extract (TART CHERRY EXTRACT) 1,000 mg cap       syringe with needle (BD LUER-LOK SYRINGE) 3 mL 21 gauge x 1 1/2 Syrg To draw up testosterone once weekly 100 each 0    tadalafil (CIALIS) 5 MG tablet Take 1 tablet (5 mg total) by mouth in the morning. 90 tablet 3    testosterone cypionate (DEPOTESTOTERONE CYPIONATE) 200 mg/mL injection Inject 0.5 mL (100 mg total) into the muscle once a week. Inject 0.5cc (100mg ) weekly 2 mL 0    topiramate (TOPAMAX) 50 MG tablet Take 1.5 tablets (75 mg total) by mouth two (2) times a day. 270 tablet 3    traMADol (ULTRAM) 50 mg tablet Take 1 tablet (50 mg total) by mouth every six (6) hours. As needed for pain 20 tablet 0    WELLBUTRIN XL 150 mg 24 hr tablet Take 1 tablet (150 mg total) by mouth every morning.      adalimumab (HUMIRA,CF, PEN) 80 mg/0.8 mL PnKt Inject the contents of 1 pen (80 mg) subcutaneously once every week 4 each 11    albuterol 2.5 mg /3 mL (0.083 %) nebulizer solution Inhale 3 mL (2.5 mg total) by nebulization every four (4) hours as needed for wheezing. (Patient not taking: Reported on 08/21/2022) 180 mL 2  albuterol HFA 90 mcg/actuation inhaler Inhale 2 puffs every six (6) hours as needed for wheezing. (Patient not taking: Reported on 09/17/2023) 8.5 g 5    colchicine (MITIGARE) 0.6 mg cap capsule Take 1 capsule (0.6 mg total) by mouth daily. (Patient not taking: Reported on 09/17/2023) 90 capsule 0 diphenoxylate-atropine (LOMOTIL) 2.5-0.025 mg per tablet Take 1 tablet by mouth four (4) times a day as needed for diarrhea. (Patient not taking: Reported on 10/08/2023) 30 tablet 1    hydrocortisone (PROCTOSOL HC) 2.5 % rectal cream Insert 1 Application into the rectum two (2) times a day as needed. (Patient not taking: Reported on 09/17/2023) 28.35 g 2    nitrofurantoin, macrocrystal-monohydrate, (MACROBID) 100 MG capsule Take 1 capsule (100 mg total) by mouth every twelve (12) hours. 20 capsule 0    polyethylene glycol (GLYCOLAX) 17 gram/dose powder  (Patient not taking: Reported on 09/17/2023)      secukinumab (COSENTYX UNOREADY PEN) 300 mg/2 mL PnIj Inject the contents of 1 pen (300 mg) under the skin every 28 days (Patient not taking: Reported on 10/08/2023) 2 mL 11     No current facility-administered medications for this visit.       Health Maintenance:     Health Maintenance   Topic Date Due    COVID-19 Vaccine (3 - Pfizer risk series) 01/24/2021    Retinal Eye Exam  10/31/2022    DTaP/Tdap/Td Vaccines (4 - Td or Tdap) 07/17/2023    Foot Exam  11/25/2023    Urine Albumin/Creatinine Ratio  11/26/2023    Hemoglobin A1c  01/07/2024    Serum Creatinine Monitoring  03/04/2024    Potassium Monitoring  03/04/2024    COPD Spirometry  08/06/2026    Pneumococcal Vaccine 0-49  Completed    Hepatitis C Screen  Completed    Influenza Vaccine  Completed       Immunizations:     Immunization History   Administered Date(s) Administered    COVID-19 VACC,MRNA,(PFIZER)(PF) 12/04/2020, 12/27/2020    HEPATITIS B VACCINE ADULT,IM(ENERGIX B, RECOMBIVAX) 03/05/2013, 04/05/2013, 09/03/2013    Hepatitis B Vaccine, Unspecified Formulation 12/27/1999, 04/29/2000    INFLUENZA INJ MDCK PF, QUAD,(FLUCELVAX)(108MO AND UP EGG FREE) 03/31/2019    INFLUENZA TIV (TRI) PF (IM)(HISTORICAL) 10/08/2011    INFLUENZA VACCINE IIV3(IM)(PF)6 MOS UP 03/05/2023    Influenza Vaccine Quad(IM)6 MO-Adult(PF) 03/05/2013, 03/18/2014, 03/22/2015, 03/05/2016, 03/19/2017, 02/23/2018, 02/19/2022    Influenza Virus Vaccine, unspecified formulation 03/19/2017, 02/23/2018, 03/25/2019, 03/10/2020, 04/05/2021    PNEUMOCOCCAL POLYSACCHARIDE 23-VALENT 12/30/2012    PPD Test 10/28/2016    Pneumococcal Conjugate 20-valent 01/02/2022    TD(TDVAX),ADSORBED,2LF(IM)(PF) 04/29/2000    TdaP 07/18/2008, 07/16/2013     I have reviewed and (if needed) updated the patient's problem list, medications, allergies, past medical and surgical history, social and family history.     Vital Signs:     Wt Readings from Last 3 Encounters:   10/08/23 (!) 214.6 kg (473 lb)   09/17/23 (!) 214.6 kg (473 lb)   08/08/23 (!) 215.5 kg (475 lb)     Temp Readings from Last 3 Encounters:   10/08/23 36.5 ??C (97.7 ??F) (Oral)   09/17/23 36.7 ??C (98.1 ??F) (Oral)   08/08/23 36.7 ??C (98 ??F) (Oral)     BP Readings from Last 3 Encounters:   10/08/23 162/104   09/17/23 140/70   08/08/23 130/76     Pulse Readings from Last 3 Encounters:   10/08/23 66   09/17/23 89   08/08/23 94  Estimated body mass index is 65.97 kg/m?? as calculated from the following:    Height as of this encounter: 180.3 cm (5' 11).    Weight as of this encounter: 214.6 kg (473 lb).  Facility age limit for growth %iles is 20 years.      Objective:       General: Alert and oriented x3. Well-appearing. No acute distress.   HEENT:  Normocephalic.  Atraumatic. Conjunctiva and sclera normal. OP MMM without lesions.   Neck:  Supple. No thyroid enlargement. No adenopathy.   Heart:  Regular rate and rhythm. Normal S1, S2. No murmurs, rubs or gallops.   Lungs:  No respiratory distress.  Lungs clear to auscultation. No wheezes, rhonchi, or rales.   GI/GU:  Soft, obese, +BS, nondistended, non-TTP. No palpable masses or organomegaly.   Extremities:  No edema. Peripheral pulses normal.   Skin:  Warm, dry. No rash or lesions present.   Neuro:  Non-focal. No obvious weakness.   Psych:  Affect normal, eye contact good, speech clear and coherent. Results  LABS  HbA1c: 10.4% (10/08/2023)  TSH: 1.937 (02/2023)  Continuous blood glucose monitor: Average close to 300 mg/dL (16/03/9603)  Continuous blood glucose monitor: 166 mg/dL (54/02/8118)    PROCEDURE NOTES  Heel injection: Triamcinolone and dexamethasone (09/10/2023)        Laurence Pons, FNP

## 2023-10-09 ENCOUNTER — Encounter: Payer: Self-pay | Admitting: Physical Therapy

## 2023-10-09 ENCOUNTER — Ambulatory Visit: Attending: Podiatry

## 2023-10-09 DIAGNOSIS — R269 Unspecified abnormalities of gait and mobility: Secondary | ICD-10-CM | POA: Diagnosis present

## 2023-10-09 DIAGNOSIS — M25672 Stiffness of left ankle, not elsewhere classified: Secondary | ICD-10-CM | POA: Diagnosis present

## 2023-10-09 DIAGNOSIS — M6281 Muscle weakness (generalized): Secondary | ICD-10-CM | POA: Diagnosis present

## 2023-10-09 DIAGNOSIS — M79672 Pain in left foot: Secondary | ICD-10-CM | POA: Insufficient documentation

## 2023-10-09 NOTE — Unmapped (Unsigned)
 Dentist gave him pain meds.

## 2023-10-13 ENCOUNTER — Encounter: Payer: Self-pay | Admitting: Podiatry

## 2023-10-13 ENCOUNTER — Ambulatory Visit (INDEPENDENT_AMBULATORY_CARE_PROVIDER_SITE_OTHER): Payer: 59 | Admitting: Podiatry

## 2023-10-13 ENCOUNTER — Encounter: Admitting: Physical Therapy

## 2023-10-13 DIAGNOSIS — B351 Tinea unguium: Secondary | ICD-10-CM

## 2023-10-13 DIAGNOSIS — M79676 Pain in unspecified toe(s): Secondary | ICD-10-CM | POA: Diagnosis not present

## 2023-10-13 DIAGNOSIS — E1142 Type 2 diabetes mellitus with diabetic polyneuropathy: Secondary | ICD-10-CM

## 2023-10-14 ENCOUNTER — Encounter: Payer: Self-pay | Admitting: Physical Therapy

## 2023-10-14 ENCOUNTER — Ambulatory Visit: Admitting: Physical Therapy

## 2023-10-14 DIAGNOSIS — R269 Unspecified abnormalities of gait and mobility: Secondary | ICD-10-CM

## 2023-10-14 DIAGNOSIS — M79672 Pain in left foot: Secondary | ICD-10-CM

## 2023-10-14 DIAGNOSIS — M6281 Muscle weakness (generalized): Secondary | ICD-10-CM

## 2023-10-14 DIAGNOSIS — M25672 Stiffness of left ankle, not elsewhere classified: Secondary | ICD-10-CM

## 2023-10-14 NOTE — Therapy (Signed)
 OUTPATIENT PHYSICAL THERAPY LOWER EXTREMITY TREATMENT  Patient Name: Jason Davidson MRN: [id number] DOB:1980/11/06, 43 y.o., male Today's Date: 10/14/2023  END OF SESSION:  PT End of Session - 10/14/23 1056     Visit Number 5    Number of Visits 6    Date for PT Re-Evaluation 10/29/23    PT Start Time 1056    PT Stop Time 1140    PT Time Calculation (min) 44 min    Activity Tolerance Patient tolerated treatment well    Behavior During Therapy WFL for tasks assessed/performed            Past Medical History:  Diagnosis Date   Asthma    Depressed    Diabetes mellitus without complication (HCC)    Gout    IBS (irritable bowel syndrome)    Morbid obesity (HCC)    Sleep apnea    Thyroid disease    Past Surgical History:  Procedure Laterality Date   COLONOSCOPY     ESOPHAGOGASTRODUODENOSCOPY ENDOSCOPY     EYE SURGERY     LASER ABLATION INCOMPETENT VEIN INI     Patient Active Problem List   Diagnosis Date Noted   Sprain of left ankle 07/03/2022   Tinea pedis 03/18/2022   Chronic pansinusitis 11/28/2021   Cough variant asthma 11/28/2021   Environmental and seasonal allergies 11/28/2021   Erectile dysfunction 11/28/2021   Nasal congestion 11/28/2021   Sinus congestion 11/28/2021   Mild intermittent asthma without complication 10/11/2021   Essential hypertension 10/10/2021   Acquired hypothyroidism 01/09/2021   Acute non-recurrent pansinusitis 07/19/2020   Hidradenitis suppurativa 04/29/2018   Allergic rhinitis 12/23/2017   Varicose veins of both lower extremities with pain 10/16/2017   IBS (irritable bowel syndrome) 06/18/2017   Chronic venous insufficiency 12/31/2016   Lymphedema 12/31/2016   Pain in joint, ankle and foot 12/31/2016   Diabetes (HCC) 12/31/2016   GERD (gastroesophageal reflux disease) 12/31/2016   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   H/O retinal detachment 10/01/2016   No diabetic retinopathy in both eyes  10/01/2016   Cellulitis of right lower extremity 06/19/2016   Chronic joint pain 11/16/2015   Skin infection 05/15/2015   Depressed    Tick bite 10/20/2014   Primary insomnia 07/08/2014   Candidal balanitis 02/16/2014   Skin tag 01/17/2014   Left shoulder pain 10/04/2013   Anxiety 09/13/2013   Diarrhea 09/13/2013   Bleeding external hemorrhoids 09/03/2013   NAFLD (nonalcoholic fatty liver disease) 96/29/5284   Headache 07/05/2013   Abdominal pain 03/05/2013   Morbid (severe) obesity due to excess calories (HCC) 03/05/2013   Suicidal ideation 02/09/2013   Obstructive sleep apnea (adult) (pediatric) 07/21/2012   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    PCP: Laurence Pons, NP  REFERRING PROVIDER: Jeni Mitten, DPM  REFERRING DIAG: M72.2 (ICD-10-CM) - Plantar fasciitis of left foot   THERAPY DIAG:  Gait difficulty  Pain in left foot  Ankle joint stiffness, left  Muscle weakness (generalized)  Rationale for Evaluation and Treatment: Rehabilitation  ONSET DATE: Chronic  SUBJECTIVE:   SUBJECTIVE STATEMENT: Pt. Reports chronic h/o L foot/ ankle pain.  Pt. Had a fall over a year ago with L ankle issues.  Pt. Has over the counter orthotics and wearing New Balance shoes.  Pt. Received 2 injections to foot in past and occasional using ankle brace/ strap to mange pain symptoms.   PERTINENT HISTORY:   Subjective:  Patient ID: Jason Davidson, male    DOB:  04/08/1981,  MRN: [id number]       Chief Complaint  Patient presents with   Plantar Fasciitis      "It's hurting."      43 y.o. male presents with the above complaint. History confirmed with patient.  He has been dealing with heel pain for some time and the injection he had at his last visit with Dr. Lara Plants was helpful, has worn off the meloxicam  did not help much, brace was helping as well as it broke recently and the strap was not working more   Objective:  Physical Exam: warm, good capillary refill, no trophic  changes or ulcerative lesions, normal DP and PT pulses, normal sensory exam, and pain on palpation of plantar medial heel at insertion of plantar fascia.     Radiographs: Previous x-rays taken December 24 showed no stress fracture or heel spur Assessment:    1. Plantar fasciitis of left foot         Plan:  Patient was evaluated and treated and all questions answered.   Discussed the etiology and treatment options for plantar fasciitis including stretching, formal physical therapy, supportive shoegears such as a running shoe or sneaker, pre fabricated orthoses, injection therapy, and oral medications. We also discussed the role of surgical treatment of this for patients who do not improve after exhausting non-surgical treatment options.     -Educated patient on stretching and icing of the affected limb -Plantar fascial brace dispensed again his previous brace failed.  Replacement was given today -Injection delivered to the plantar fascia of the left foot. -Rx for diclofenac . Educated on use, risks and benefits of the medication   After sterile prep with povidone-iodine solution and alcohol, the left heel was injected with 0.5cc 2% xylocaine  plain, 0.5cc 0.5% marcaine plain, 10mg  triamcinolone  acetonide, and 4mg  dexamethasone  was injected along the medial plantar fascia at the insertion on the plantar calcaneus. The patient tolerated the procedure well without complication.   Return if symptoms worsen or fail to improve.      PAIN:  Are you having pain? Yes: NPRS scale: 5/10 Pain location: L heel Pain description: sharp/ persistent Aggravating factors: walking Relieving factors: rest/ injections  PRECAUTIONS: Fall  RED FLAGS: None   WEIGHT BEARING RESTRICTIONS: No  FALLS:  Has patient fallen in last 6 months? 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  DIAGNOSTIC FINDINGS: CLINICAL DATA:  Fall 3 days ago with left ankle pain.   EXAM: LEFT ANKLE COMPLETE - 3+ VIEW   COMPARISON:  None Available.   FINDINGS: There is no evidence of fracture, dislocation, or joint effusion. There is no evidence of arthropathy or other focal bone abnormality. Soft tissues are unremarkable.  PATIENT SURVEYS:  LEFS 29 out of 80  COGNITION: Overall cognitive status: Within functional limits for tasks assessed     SENSATION: WFL  EDEMA:  L lower leg lymphadema  MUSCLE LENGTH: Hamstrings: limited B proximal/distal hamstring flexibility.  Thomas test: NT  POSTURE: rounded shoulders and forward head  PALPATION: (+) tenderness with palpation over L plantar aspect of foot (medial).  Good L great toe extension.    LOWER EXTREMITY ROM:  Active ROM Right eval Left eval  Hip flexion    Hip extension    Hip abduction    Hip adduction    Hip internal rotation    Hip external rotation    Knee flexion [facility]  Knee extension [facility]  Ankle dorsiflexion 11 deg. 12 deg.  Ankle plantarflexion 50 deg. 58 deg.  Ankle inversion 30 deg. 30 deg.  Ankle eversion 22 deg. 20 deg.    (Blank rows = not tested)  LOWER EXTREMITY MMT:  MMT Right eval Left eval  Hip flexion    Hip extension    Hip abduction    Hip adduction    Hip internal rotation    Hip external rotation    Knee flexion 5 5  Knee extension 5 5  Ankle dorsiflexion 5 5  Ankle plantarflexion 4 4  Ankle inversion 4+ 4+  Ankle eversion 4+ 4+   (Blank rows = not tested)  LOWER EXTREMITY SPECIAL TESTS:  NT  FUNCTIONAL TESTS:  NT  GAIT: Distance walked: in clinic Assistive device utilized: None Level of assistance: Complete Independence Comments: L antalgic  gait pattern.  Discussed use of SPC to decrease L foot/heel wt. bearing                                                                                                                              TREATMENT DATE: 10/14/23   Subjective:  Patient reports continued L foot/heel pain with walking.  Pt. Arrived to PT with new sneakers and is planning to put inserts from other shoes into new shoes.    Manual: Deep STM to L plantar fascia x 25 minutes - tenderness noted to plantar fascia insertion on calcaneus  Supine L gastroc/ IV/ EV/ great toe extension stretches with static holds during/ after STM.    Discussed sneakers/ inserts and importance of gait pattern (heel strike/ toe off) while using SPC.     PATIENT EDUCATION:  Education details: HEP/ shoe wear/ Ice massage Person educated: Patient Education method: Explanation, Demonstration, and Handouts Education comprehension: verbalized understanding and returned demonstration  HOME EXERCISE PROGRAM: Access Code: [email or web address] URL: [email or web address] Date: 09/17/2023 Prepared by: Hazeline Lister  Exercises - Seated Gastroc Stretch with Strap  - 1 x daily - 7 x weekly - 1 sets - 5 reps - Seated Self Great Toe Stretch  - 1 x daily - 7 x weekly - 1 sets - 5 reps - Foot Roller Plantar Massage  - 1 x daily - 7 x weekly - 1 sets - 5 reps  ASSESSMENT:  CLINICAL IMPRESSION:    Session focused on manual therapy and STM to L plantar fascia as well as seated foot/ankle stretches. Tolerated session well with marked tenderness in L heel/ arch.  Continued discussion about proper supportive footwear/ inserts/ gait pattern. Pt. Will benefit from short-term skilled PT services to develop stretching/ strengthening ex. Program and education to improve pain-free mobility.     OBJECTIVE IMPAIRMENTS: Abnormal gait, decreased activity  tolerance, decreased balance, decreased mobility, difficulty walking, decreased ROM, decreased strength, impaired  flexibility, improper body mechanics, obesity, and pain.   ACTIVITY LIMITATIONS: squatting and locomotion level  PARTICIPATION LIMITATIONS: community activity, occupation, and yard work  PERSONAL FACTORS: Fitness and Past/current experiences are also affecting patient's functional outcome.   REHAB POTENTIAL: Good  CLINICAL DECISION MAKING: Evolving/moderate complexity  EVALUATION COMPLEXITY: Moderate   GOALS: Goals reviewed with patient? Yes  SHORT TERM GOALS: Target date: 10/08/23 Pt. Independent with HEP to increase L ankle stability to WNL as compared to R to improve pain-free mobility.   Baseline:  see above Goal status: Not met   LONG TERM GOALS: Target date: 10/29/23  Pt. Will increase LEFS to >40 out of 80 to improve pain-free mobility. Baseline: 29 out of 80 Goal status: INITIAL  2.  Pt. Will reports <3/10 L foot/ankle pain while completing job/ walking about mobile home park.   Baseline: >5/10 L foot pain currently Goal status: INITIAL  3.  Pt. Able to complete camping trip with [employer] with no L foot pain/ limitations.   Baseline:  increase L foot pain/limitations.  Goal status: INITIAL  PLAN:  PT FREQUENCY: 1x/week  PT DURATION: 6 weeks  PLANNED INTERVENTIONS: 97110-Therapeutic exercises, 97530- Therapeutic activity, 97112- Neuromuscular re-education, 97535- Self Care, 16109- Manual therapy, [phone]- Gait training, [phone]- Ionotophoresis 4mg /ml Dexamethasone , Patient/Family education, Balance training, Stair training, Taping, Dry Needling, Joint mobilization, Cryotherapy, and Moist heat  PLAN FOR NEXT SESSION: Reassess L plantar foot pain/ progress HEP  Lendell Quarry, PT, DPT # [phone] Physical Therapist - [facility]  10/14/2023, 12:45 PM

## 2023-10-15 ENCOUNTER — Encounter: Admitting: Physical Therapy

## 2023-10-15 DIAGNOSIS — E1165 Type 2 diabetes mellitus with hyperglycemia: Principal | ICD-10-CM

## 2023-10-15 DIAGNOSIS — Z794 Long term (current) use of insulin: Principal | ICD-10-CM

## 2023-10-15 MED ORDER — LANTUS U-100 INSULIN 100 UNIT/ML SUBCUTANEOUS SOLUTION
Freq: Every evening | SUBCUTANEOUS | 2 refills | 120.00000 days
Start: 2023-10-15 — End: 2024-10-13

## 2023-10-15 NOTE — Unmapped (Signed)
 Patient is requesting the following refill  Requested Prescriptions     Pending Prescriptions Disp Refills    insulin glargine (LANTUS U-100 INSULIN) 100 unit/mL injection [Pharmacy Med Name: LANTUS 100 UNIT/ML VIAL] 18 mL 3     Sig: Inject 0.2 mL (20 Units total) under the skin nightly.       Recent Visits  Date Type Provider Dept   10/08/23 Office Visit Baldwin Bones, FNP [place] [facility] [address] [facility]   09/17/23 Office Visit Baldwin Bones, FNP [place] [facility] [address] [facility]   08/08/23 Office Visit Baldwin Bones, FNP [place] [facility] [address] [facility]   07/08/23 Office Visit Baldwin Bones, FNP [place] [facility] [address] [facility]   06/25/23 Office Visit Baldwin Bones, FNP [place] [facility] [address] At [facility]   04/23/23 Office Visit Baldwin Bones, FNP [place] [facility] [address] [facility]   03/05/23 Office Visit Baldwin Bones, FNP [place] [facility] [address] [facility]   11/26/22 Office Visit Baldwin Bones, FNP [place] [facility] [address] [facility]   Showing recent visits within past 365 days and meeting all other requirements  Future Appointments  Date Type Provider Dept   01/08/24 Appointment Baldwin Bones, FNP  [facility] [address] [facility]   Showing future appointments within next 365 days and meeting all other requirements       Labs: A1c:   Hemoglobin A1C (%)   Date Value   10/08/2023 10.4 (A)   09/15/2014 6.0    Creatinine:   Creatinine (mg/dL)   Date Value   16/03/9603 0.92   01/17/2014 0.74    eGFR:   eGFR CKD-EPI (2021) Male (mL/min/1.37m2)   Date Value   03/05/2023 >90

## 2023-10-17 MED ORDER — LANTUS U-100 INSULIN 100 UNIT/ML SUBCUTANEOUS SOLUTION
Freq: Every evening | SUBCUTANEOUS | 3 refills | 90.00000 days | Status: CP
Start: 2023-10-17 — End: 2024-10-15

## 2023-10-18 NOTE — Progress Notes (Signed)
  Subjective:  Patient ID: Jason Davidson, male    DOB: 06-06-1981,  MRN: [id number]  [name] presents to clinic today for at risk foot care with history of diabetic neuropathy and painful elongated mycotic toenails 1-5 bilaterally which are tender when wearing enclosed shoe gear. Pain is relieved with periodic professional debridement. He is seeing PT for plantar fasciitis. Has purchased a new pair of New Balance sneakers and is doing well in therapy. Chief Complaint  Patient presents with   Diabetes    "Nail care "  Dudley Ghee, FNP - 10/08/2023; A1c - 10.4   New problem(s): None.   PCP is Laurence Pons, NP.  Allergies  Allergen Reactions   Penicillins Swelling and Hives   Sulfa Antibiotics Nausea And Vomiting   Erythromycin Nausea And Vomiting   Metronidazole  Nausea And Vomiting    Review of Systems: Negative except as noted in the HPI.  Objective: No changes noted in today's physical examination. There were no vitals filed for this visit. Jason Davidson is a pleasant 43 y.o. male morbidly obese in NAD. AAO x 3.  Vascular Examination: Capillary refill time <3 seconds b/l LE. Lymphedema b/l LE. Palpable pedal pulses b/l LE. Digital hair sparse b/l. Skin temperature gradient WNL b/l. No varicosities b/l.  Dermatological Examination: Pedal skin with normal turgor, texture and tone b/l. No open wounds. No interdigital macerations b/l. Toenails 1-5 b/l thickened, discolored, dystrophic with subungual debris. There is pain on palpation to dorsal aspect of nailplates.   Neurological Examination: Protective sensation intact with 10 gram monofilament b/l LE. Vibratory sensation intact b/l LE. Pt has subjective symptoms of neuropathy.  Musculoskeletal Examination: Normal muscle strength 5/5 to all lower extremity muscle groups bilaterally. No pain, crepitus or joint limitation noted with ROM b/l LE. No gross bony pedal deformities b/l. Patient ambulates  independently without assistive aids.  Assessment/Plan: 1. Pain due to onychomycosis of toenail   2. Diabetic polyneuropathy associated with type 2 diabetes mellitus [facility])     Consent given for treatment. Patient examined. All patient's and/or POA's questions/concerns addressed on today's visit. Toenails 1-5 debrided in length and girth without incident. Continue foot and shoe inspections daily. Monitor blood glucose per PCP/Endocrinologist's recommendations. Continue soft, supportive shoe gear daily. Report any pedal injuries to medical professional. Call office if there are any questions/concerns. -Patient/POA to call should there be question/concern in the interim.   Return in about 3 months (around 01/13/2024).  [name], DPM      [place] LOCATION: [address], [place] [address]                   Office [phone]   [facility] LOCATION: [address] [place], [place] [address] Office [phone]

## 2023-10-20 ENCOUNTER — Ambulatory Visit: Admitting: Physical Therapy

## 2023-10-20 ENCOUNTER — Encounter: Payer: Self-pay | Admitting: Physical Therapy

## 2023-10-20 DIAGNOSIS — M79672 Pain in left foot: Secondary | ICD-10-CM

## 2023-10-20 DIAGNOSIS — R269 Unspecified abnormalities of gait and mobility: Secondary | ICD-10-CM | POA: Diagnosis not present

## 2023-10-20 DIAGNOSIS — M6281 Muscle weakness (generalized): Secondary | ICD-10-CM

## 2023-10-20 DIAGNOSIS — M25672 Stiffness of left ankle, not elsewhere classified: Secondary | ICD-10-CM

## 2023-10-20 NOTE — Therapy (Signed)
 OUTPATIENT PHYSICAL THERAPY LOWER EXTREMITY TREATMENT/ RECERTIFICATION  Patient Name: Jason Davidson MRN: [id number] DOB:Nov 05, 1980, 43 y.o., male Today's Date: 10/20/2023  END OF SESSION:  PT End of Session - 10/20/23 1354     Visit Number 6    Number of Visits 14    Date for PT Re-Evaluation 11/17/23    PT Start Time 1350    PT Stop Time 1433    PT Time Calculation (min) 43 min    Activity Tolerance Patient tolerated treatment well    Behavior During Therapy WFL for tasks assessed/performed            Past Medical History:  Diagnosis Date   Asthma    Depressed    Diabetes mellitus without complication (HCC)    Gout    IBS (irritable bowel syndrome)    Morbid obesity (HCC)    Sleep apnea    Thyroid disease    Past Surgical History:  Procedure Laterality Date   COLONOSCOPY     ESOPHAGOGASTRODUODENOSCOPY ENDOSCOPY     EYE SURGERY     LASER ABLATION INCOMPETENT VEIN INI     Patient Active Problem List   Diagnosis Date Noted   Sprain of left ankle 07/03/2022   Tinea pedis 03/18/2022   Chronic pansinusitis 11/28/2021   Cough variant asthma 11/28/2021   Environmental and seasonal allergies 11/28/2021   Erectile dysfunction 11/28/2021   Nasal congestion 11/28/2021   Sinus congestion 11/28/2021   Mild intermittent asthma without complication 10/11/2021   Essential hypertension 10/10/2021   Acquired hypothyroidism 01/09/2021   Acute non-recurrent pansinusitis 07/19/2020   Hidradenitis suppurativa 04/29/2018   Allergic rhinitis 12/23/2017   Varicose veins of both lower extremities with pain 10/16/2017   IBS (irritable bowel syndrome) 06/18/2017   Chronic venous insufficiency 12/31/2016   Lymphedema 12/31/2016   Pain in joint, ankle and foot 12/31/2016   Diabetes (HCC) 12/31/2016   GERD (gastroesophageal reflux disease) 12/31/2016   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   H/O retinal detachment 10/01/2016   No diabetic retinopathy  in both eyes 10/01/2016   Cellulitis of right lower extremity 06/19/2016   Chronic joint pain 11/16/2015   Skin infection 05/15/2015   Depressed    Tick bite 10/20/2014   Primary insomnia 07/08/2014   Candidal balanitis 02/16/2014   Skin tag 01/17/2014   Left shoulder pain 10/04/2013   Anxiety 09/13/2013   Diarrhea 09/13/2013   Bleeding external hemorrhoids 09/03/2013   NAFLD (nonalcoholic fatty liver disease) 40/98/1191   Headache 07/05/2013   Abdominal pain 03/05/2013   Morbid (severe) obesity due to excess calories (HCC) 03/05/2013   Suicidal ideation 02/09/2013   Obstructive sleep apnea (adult) (pediatric) 07/21/2012   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    PCP: Laurence Pons, NP  REFERRING PROVIDER: Jeni Mitten, DPM  REFERRING DIAG: M72.2 (ICD-10-CM) - Plantar fasciitis of left foot   THERAPY DIAG:  Gait difficulty  Pain in left foot  Ankle joint stiffness, left  Muscle weakness (generalized)  Rationale for Evaluation and Treatment: Rehabilitation  ONSET DATE: Chronic  SUBJECTIVE:   SUBJECTIVE STATEMENT: Pt. Reports chronic h/o L foot/ ankle pain.  Pt. Had a fall over a year ago with L ankle issues.  Pt. Has over the counter orthotics and wearing New Balance shoes.  Pt. Received 2 injections to foot in past and occasional using ankle brace/ strap to mange pain symptoms.   PERTINENT HISTORY:   Subjective:  Patient ID: [name], male  DOB: January 04, 1981,  MRN: [id number]       Chief Complaint  Patient presents with   Plantar Fasciitis      "It's hurting."      43 y.o. male presents with the above complaint. History confirmed with patient.  He has been dealing with heel pain for some time and the injection he had at his last visit with Dr. Lara Plants was helpful, has worn off the meloxicam  did not help much, brace was helping as well as it broke recently and the strap was not working more   Objective:  Physical Exam: warm, good capillary refill,  no trophic changes or ulcerative lesions, normal DP and PT pulses, normal sensory exam, and pain on palpation of plantar medial heel at insertion of plantar fascia.     Radiographs: Previous x-rays taken December 24 showed no stress fracture or heel spur Assessment:    1. Plantar fasciitis of left foot         Plan:  Patient was evaluated and treated and all questions answered.   Discussed the etiology and treatment options for plantar fasciitis including stretching, formal physical therapy, supportive shoegears such as a running shoe or sneaker, pre fabricated orthoses, injection therapy, and oral medications. We also discussed the role of surgical treatment of this for patients who do not improve after exhausting non-surgical treatment options.     -Educated patient on stretching and icing of the affected limb -Plantar fascial brace dispensed again his previous brace failed.  Replacement was given today -Injection delivered to the plantar fascia of the left foot. -Rx for diclofenac . Educated on use, risks and benefits of the medication   After sterile prep with povidone-iodine solution and alcohol, the left heel was injected with 0.5cc 2% xylocaine  plain, 0.5cc 0.5% marcaine plain, 10mg  triamcinolone  acetonide, and 4mg  dexamethasone  was injected along the medial plantar fascia at the insertion on the plantar calcaneus. The patient tolerated the procedure well without complication.   Return if symptoms worsen or fail to improve.      PAIN:  Are you having pain? Yes: NPRS scale: 5/10 Pain location: L heel Pain description: sharp/ persistent Aggravating factors: walking Relieving factors: rest/ injections  PRECAUTIONS: Fall  RED FLAGS: None   WEIGHT BEARING RESTRICTIONS: No  FALLS:  Has patient fallen in last 6 months? 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  DIAGNOSTIC FINDINGS: CLINICAL DATA:  Fall 3 days ago with left ankle pain.   EXAM: LEFT ANKLE COMPLETE - 3+ VIEW   COMPARISON:  None Available.   FINDINGS: There is no evidence of fracture, dislocation, or joint effusion. There is no evidence of arthropathy or other focal bone abnormality. Soft tissues are unremarkable.  PATIENT SURVEYS:  LEFS 29 out of 80  COGNITION: Overall cognitive status: Within functional limits for tasks assessed     SENSATION: WFL  EDEMA:  L lower leg lymphadema  MUSCLE LENGTH: Hamstrings: limited B proximal/distal hamstring flexibility.  Thomas test: NT  POSTURE: rounded shoulders and forward head  PALPATION: (+) tenderness with palpation over L plantar aspect of foot (medial).  Good L great toe extension.    LOWER EXTREMITY ROM:  Active ROM Right eval Left  eval  Hip flexion    Hip extension    Hip abduction    Hip adduction    Hip internal rotation    Hip external rotation    Knee flexion [facility]  Knee extension [facility]  Ankle dorsiflexion 11 deg. 12 deg.  Ankle plantarflexion 50 deg. 58 deg.  Ankle inversion 30 deg. 30 deg.  Ankle eversion 22 deg. 20 deg.    (Blank rows = not tested)  LOWER EXTREMITY MMT:  MMT Right eval Left eval  Hip flexion    Hip extension    Hip abduction    Hip adduction    Hip internal rotation    Hip external rotation    Knee flexion 5 5  Knee extension 5 5  Ankle dorsiflexion 5 5  Ankle plantarflexion 4 4  Ankle inversion 4+ 4+  Ankle eversion 4+ 4+   (Blank rows = not tested)  LOWER EXTREMITY SPECIAL TESTS:  NT  FUNCTIONAL TESTS:  NT  GAIT: Distance walked: in clinic Assistive device utilized: None Level of assistance: Complete  Independence Comments: L antalgic gait pattern.  Discussed use of SPC to decrease L foot/heel wt. bearing                                                                                                                              TREATMENT DATE: 10/20/23   Subjective:  Patient reports continued L foot/heel pain with walking.  Pt. Arrived to PT with new sneakers and is planning to put inserts from other shoes into new shoes.  Pt. Using SPC with all aspects of walking, except for night time when walking to bathroom.  Pt. States blood sugar was 116 last night but A1C remains high.  Pt. States his PCP wants blood sugar in the 90's.    There.ex.:  Walking in hallway with gait reassessment and use of SPC with consistent 2-point gait pattern.    Seated L LAQ/ nerve glides 5x.   Seated towel crunches with L foot/toes.    Seated heel and toe raises 20x.  Discussed HEP  Supine L ankle circles/ IV/ EV/ PF/ DF.    Manual:  Deep STM to L plantar fascia x 22 minutes - tenderness noted to plantar fascia insertion on calcaneus  Supine L gastroc/ IV/ EV/ great toe extension stretches with static holds during/ after STM.    Discussed sneakers/ inserts and importance of gait pattern (heel strike/ toe off) while using SPC.    Seated L foot ice bottle massage.     PATIENT EDUCATION:  Education details: HEP/ shoe wear/ Ice massage Person educated: Patient Education method: Explanation, Demonstration, and Handouts Education comprehension: verbalized understanding and returned demonstration  HOME EXERCISE PROGRAM: Access Code: [email or web address] URL: [email or web address] Date: 09/17/2023 Prepared by: Hazeline Lister  Exercises - Seated Gastroc Stretch with Strap  - 1 x daily - 7 x weekly - 1 sets - 5 reps - Seated Self Great Toe  Stretch  - 1 x daily - 7 x weekly - 1 sets - 5 reps - Foot Roller Plantar Massage  - 1 x daily - 7 x weekly - 1 sets - 5 reps  ASSESSMENT:  CLINICAL IMPRESSION:     Session focused on manual therapy and STM to L plantar fascia as well as seated foot/ankle stretches. Tolerated session well with marked tenderness in L heel, not arch.  Continued discussion about proper supportive footwear/ inserts/ gait pattern.  See updated goals.  Pt. Will benefit from short-term skilled PT services to develop stretching/ strengthening ex. Program and education to improve pain-free mobility.     OBJECTIVE IMPAIRMENTS: Abnormal gait, decreased activity tolerance, decreased balance, decreased mobility, difficulty walking, decreased ROM, decreased strength, impaired flexibility, improper body mechanics, obesity, and pain.   ACTIVITY LIMITATIONS: squatting and locomotion level  PARTICIPATION LIMITATIONS: community activity, occupation, and yard work  PERSONAL FACTORS: Fitness and Past/current experiences are also affecting patient's functional outcome.   REHAB POTENTIAL: Good  CLINICAL DECISION MAKING: Evolving/moderate complexity  EVALUATION COMPLEXITY: Moderate   GOALS: Goals reviewed with patient? Yes  LONG TERM GOALS: Target date: 11/17/23  Pt. Independent with HEP to increase L ankle stability to WNL as compared to R to improve pain-free mobility.   Baseline:  see above Goal status: Not met  2.  Pt. Will increase LEFS to >40 out of 80 to improve pain-free mobility. Baseline: 29 out of 80 Goal status: Ongoing  3.  Pt. Will reports <3/10 L foot/ankle pain while completing job/ walking about mobile home park.   Baseline: >5/10 L foot pain currently Goal status: Not met  4.  Pt. Able to complete camping trip with [employer] with no L foot pain/ limitations.   Baseline:  increase L foot pain/limitations.  Goal status: Not met  PLAN:  PT FREQUENCY: 1-2x/week  PT DURATION: 4 weeks  PLANNED INTERVENTIONS: 97110-Therapeutic exercises, 97530- Therapeutic activity, 97112- Neuromuscular re-education, 97535- Self Care, 44010- Manual therapy, [phone]- Gait training,  [phone]- Ionotophoresis 4mg /ml Dexamethasone , Patient/Family education, Balance training, Stair training, Taping, Dry Needling, Joint mobilization, Cryotherapy, and Moist heat  PLAN FOR NEXT SESSION: Issue new HEP/ CHECK inserts  Lendell Quarry, PT, DPT # [phone] Physical Therapist - [facility]  10/20/2023, 6:27 PM

## 2023-10-20 NOTE — Unmapped (Signed)
 BP measurements reviewed. Still elevated, will continue to monitor.   Logan Quinn

## 2023-10-21 ENCOUNTER — Encounter: Admit: 2023-10-21 | Discharge: 2023-10-22 | Payer: Medicare (Managed Care)

## 2023-10-21 DIAGNOSIS — E291 Testicular hypofunction: Principal | ICD-10-CM

## 2023-10-21 DIAGNOSIS — E280 Estrogen excess: Principal | ICD-10-CM

## 2023-10-21 DIAGNOSIS — Z125 Encounter for screening for malignant neoplasm of prostate: Principal | ICD-10-CM

## 2023-10-21 DIAGNOSIS — N529 Male erectile dysfunction, unspecified: Principal | ICD-10-CM

## 2023-10-21 DIAGNOSIS — E1069 Type 1 diabetes mellitus with other specified complication: Principal | ICD-10-CM

## 2023-10-21 MED ORDER — TADALAFIL 5 MG TABLET
ORAL_TABLET | Freq: Every day | ORAL | 3 refills | 90.00000 days | Status: CP
Start: 2023-10-21 — End: ?

## 2023-10-21 NOTE — Therapy (Signed)
 OUTPATIENT PHYSICAL THERAPY LOWER EXTREMITY TREATMENT  Patient Name: Jason Davidson MRN: [id number] DOB:[birth year], 43 y.o., male Today's Date: 10/22/2023  END OF SESSION:  PT End of Session - 10/22/23 1347     Visit Number 7    Number of Visits 14    Date for PT Re-Evaluation 11/17/23    PT Start Time 1346    PT Stop Time 1426    PT Time Calculation (min) 40 min    Activity Tolerance Patient tolerated treatment well    Behavior During Therapy WFL for tasks assessed/performed             Past Medical History:  Diagnosis Date   Asthma    Depressed    Diabetes mellitus without complication (HCC)    Gout    IBS (irritable bowel syndrome)    Morbid obesity (HCC)    Sleep apnea    Thyroid disease    Past Surgical History:  Procedure Laterality Date   COLONOSCOPY     ESOPHAGOGASTRODUODENOSCOPY ENDOSCOPY     EYE SURGERY     LASER ABLATION INCOMPETENT VEIN INI     Patient Active Problem List   Diagnosis Date Noted   Sprain of left ankle 07/03/2022   Tinea pedis 03/18/2022   Chronic pansinusitis 11/28/2021   Cough variant asthma 11/28/2021   Environmental and seasonal allergies 11/28/2021   Erectile dysfunction 11/28/2021   Nasal congestion 11/28/2021   Sinus congestion 11/28/2021   Mild intermittent asthma without complication 10/11/2021   Essential hypertension 10/10/2021   Acquired hypothyroidism 01/09/2021   Acute non-recurrent pansinusitis 07/19/2020   Hidradenitis suppurativa 04/29/2018   Allergic rhinitis 12/23/2017   Varicose veins of both lower extremities with pain 10/16/2017   IBS (irritable bowel syndrome) 06/18/2017   Chronic venous insufficiency 12/31/2016   Lymphedema 12/31/2016   Pain in joint, ankle and foot 12/31/2016   Diabetes (HCC) 12/31/2016   GERD (gastroesophageal reflux disease) 12/31/2016   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   H/O retinal detachment 10/01/2016   No diabetic retinopathy in both eyes  10/01/2016   Cellulitis of right lower extremity 06/19/2016   Chronic joint pain 11/16/2015   Skin infection 05/15/2015   Depressed    Tick bite 10/20/2014   Primary insomnia 07/08/2014   Candidal balanitis 02/16/2014   Skin tag 01/17/2014   Left shoulder pain 10/04/2013   Anxiety 09/13/2013   Diarrhea 09/13/2013   Bleeding external hemorrhoids 09/03/2013   NAFLD (nonalcoholic fatty liver disease) 46/96/2952   Headache 07/05/2013   Abdominal pain 03/05/2013   Morbid (severe) obesity due to excess calories (HCC) 03/05/2013   Suicidal ideation 02/09/2013   Obstructive sleep apnea (adult) (pediatric) 07/21/2012   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    PCP: Jason Pons, NP  REFERRING PROVIDER: Jeni Davidson, DPM  REFERRING DIAG: M72.2 (ICD-10-CM) - Plantar fasciitis of left foot   THERAPY DIAG:  Gait difficulty  Pain in left foot  Ankle joint stiffness, left  Muscle weakness (generalized)  Rationale for Evaluation and Treatment: Rehabilitation  ONSET DATE: Chronic  SUBJECTIVE:   SUBJECTIVE STATEMENT: Pt. Reports chronic h/o L foot/ ankle pain.  Pt. Had a fall over a year ago with L ankle issues.  Pt. Has over the counter orthotics and wearing New Balance shoes.  Pt. Received 2 injections to foot in past and occasional using ankle brace/ strap to mange pain symptoms.   PERTINENT HISTORY:   Subjective:  Patient ID: Jason Davidson, male  DOB: Jul 18, 1980,  MRN: [id number]       Chief Complaint  Patient presents with   Plantar Fasciitis      "It's hurting."      43 y.o. male presents with the above complaint. History confirmed with patient.  He has been dealing with heel pain for some time and the injection he had at his last visit with Dr. Lara Davidson was helpful, has worn off the meloxicam  did not help much, brace was helping as well as it broke recently and the strap was not working more   Objective:  Physical Exam: warm, good capillary refill, no trophic  changes or ulcerative lesions, normal DP and PT pulses, normal sensory exam, and pain on palpation of plantar medial heel at insertion of plantar fascia.     Radiographs: Previous x-rays taken December 24 showed no stress fracture or heel spur Assessment:    1. Plantar fasciitis of left foot         Plan:  Patient was evaluated and treated and all questions answered.   Discussed the etiology and treatment options for plantar fasciitis including stretching, formal physical therapy, supportive shoegears such as a running shoe or sneaker, pre fabricated orthoses, injection therapy, and oral medications. We also discussed the role of surgical treatment of this for patients who do not improve after exhausting non-surgical treatment options.     -Educated patient on stretching and icing of the affected limb -Plantar fascial brace dispensed again his previous brace failed.  Replacement was given today -Injection delivered to the plantar fascia of the left foot. -Rx for diclofenac . Educated on use, risks and benefits of the medication   After sterile prep with povidone-iodine solution and alcohol, the left heel was injected with 0.5cc 2% xylocaine  plain, 0.5cc 0.5% marcaine plain, 10mg  triamcinolone  acetonide, and 4mg  dexamethasone  was injected along the medial plantar fascia at the insertion on the plantar calcaneus. The patient tolerated the procedure well without complication.   Return if symptoms worsen or fail to improve.      PAIN:  Are you having pain? Yes: NPRS scale: 5/10 Pain location: L heel Pain description: sharp/ persistent Aggravating factors: walking Relieving factors: rest/ injections  PRECAUTIONS: Fall  RED FLAGS: None   WEIGHT BEARING RESTRICTIONS: No  FALLS:  Has patient fallen in last 6 months? 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  DIAGNOSTIC FINDINGS: CLINICAL DATA:  Fall 3 days ago with left ankle pain.   EXAM: LEFT ANKLE COMPLETE - 3+ VIEW   COMPARISON:  None Available.   FINDINGS: There is no evidence of fracture, dislocation, or joint effusion. There is no evidence of arthropathy or other focal bone abnormality. Soft tissues are unremarkable.  PATIENT SURVEYS:  LEFS 29 out of 80  COGNITION: Overall cognitive status: Within functional limits for tasks assessed     SENSATION: WFL  EDEMA:  L lower leg lymphadema  MUSCLE LENGTH: Hamstrings: limited B proximal/distal hamstring flexibility.  Thomas test: NT  POSTURE: rounded shoulders and forward head  PALPATION: (+) tenderness with palpation over L plantar aspect of foot (medial).  Good L great toe extension.    LOWER EXTREMITY ROM:  Active ROM Right eval Left  eval  Hip flexion    Hip extension    Hip abduction    Hip adduction    Hip internal rotation    Hip external rotation    Knee flexion [facility]  Knee extension [facility]  Ankle dorsiflexion 11 deg. 12 deg.  Ankle plantarflexion 50 deg. 58 deg.  Ankle inversion 30 deg. 30 deg.  Ankle eversion 22 deg. 20 deg.    (Blank rows = not tested)  LOWER EXTREMITY MMT:  MMT Right eval Left eval  Hip flexion    Hip extension    Hip abduction    Hip adduction    Hip internal rotation    Hip external rotation    Knee flexion 5 5  Knee extension 5 5  Ankle dorsiflexion 5 5  Ankle plantarflexion 4 4  Ankle inversion 4+ 4+  Ankle eversion 4+ 4+   (Blank rows = not tested)  LOWER EXTREMITY SPECIAL TESTS:  NT  FUNCTIONAL TESTS:  NT  GAIT: Distance walked: in clinic Assistive device utilized: None Level of assistance: Complete Independence Comments: L antalgic  gait pattern.  Discussed use of SPC to decrease L foot/heel wt. bearing                                                                                                                              TREATMENT DATE: 10/22/23   Subjective:  Patient reports 2-3/10 pain in L foot. Has put his inserts into his new shoes and seems to be better.   There.ex.: Seated towel crunches with L foot/toes.    Seated heel and toe raises 20x.  Discussed HEP  Seated calf stretch stretch with strap 3 x 30 seconds   Supine L ankle circles/ IV/ EV/ PF/ DF.    Manual:  Deep STM to L plantar fascia x 22 minutes - tenderness noted to plantar fascia insertion on calcaneus  Supine L gastroc/ IV/ EV/ great toe extension stretches with static holds during/ after STM.    Seated L foot ice bottle massage.     PATIENT EDUCATION:  Education details: HEP/ shoe wear/ Ice massage Person educated: Patient Education method: Explanation, Demonstration, and Handouts Education comprehension: verbalized understanding and returned demonstration  HOME EXERCISE PROGRAM: Access Code: [email or web address] URL: [email or web address] Date: 09/17/2023 Prepared by: Hazeline Lister  Exercises - Seated Gastroc Stretch with Strap  - 1 x daily - 7 x weekly - 1 sets - 5 reps - Seated Self Great Toe Stretch  - 1 x daily - 7 x weekly - 1 sets - 5 reps - Foot Roller Plantar Massage  - 1 x daily - 7 x weekly - 1 sets - 5 reps  ASSESSMENT:  CLINICAL IMPRESSION:     Session focused on manual therapy and STM to L plantar fascia as well as seated foot/ankle stretches. Tolerated session well with marked tenderness in L heel, not arch.  Continued discussion about proper supportive footwear/ inserts/ gait pattern.  See updated goals.  Pt. Will benefit from short-term skilled PT services to develop stretching/ strengthening ex. Program and education to improve pain-free mobility.     OBJECTIVE IMPAIRMENTS: Abnormal gait, decreased activity  tolerance, decreased balance, decreased mobility, difficulty walking, decreased ROM, decreased strength, impaired flexibility, improper body mechanics, obesity, and pain.   ACTIVITY LIMITATIONS: squatting and locomotion level  PARTICIPATION LIMITATIONS: community activity, occupation, and yard work  PERSONAL FACTORS: Fitness and Past/current experiences are also affecting patient's functional outcome.   REHAB POTENTIAL: Good  CLINICAL DECISION MAKING: Evolving/moderate complexity  EVALUATION COMPLEXITY: Moderate   GOALS: Goals reviewed with patient? Yes  LONG TERM GOALS: Target date: 11/17/23  Pt. Independent with HEP to increase L ankle stability to WNL as compared to R to improve pain-free mobility.   Baseline:  see above Goal status: Not met  2.  Pt. Will increase LEFS to >40 out of 80 to improve pain-free mobility. Baseline: 29 out of 80 Goal status: Ongoing  3.  Pt. Will reports <3/10 L foot/ankle pain while completing job/ walking about mobile home park.   Baseline: >5/10 L foot pain currently Goal status: Not met  4.  Pt. Able to complete camping trip with [employer] with no L foot pain/ limitations.   Baseline:  increase L foot pain/limitations.  Goal status: Not met  PLAN:  PT FREQUENCY: 1-2x/week  PT DURATION: 4 weeks  PLANNED INTERVENTIONS: 97110-Therapeutic exercises, 97530- Therapeutic activity, 97112- Neuromuscular re-education, 97535- Self Care, 11914- Manual therapy, [phone]- Gait training, [phone]- Ionotophoresis 4mg /ml Dexamethasone , Patient/Family education, Balance training, Stair training, Taping, Dry Needling, Joint mobilization, Cryotherapy, and Moist heat  PLAN FOR NEXT SESSION: Issue new HEP/ CHECK inserts  Janine Melbourne, PT, DPT  Physical Therapist - [facility]  [facility]  10/22/2023, 1:48 PM

## 2023-10-21 NOTE — Unmapped (Signed)
 Recheck labs tomorrow    Hypogonadism - Patient Information  Logan Rue, MD      Introduction  Hypogonadism is a condition defined by low serum testosterone levels and symptoms such as fatigue, decreased libido, erectile dysfunction, weakness, and weight gain. It is known to occur with aging, as most men have declining testosterone levels beginning in their 30???s, but it can also be associated with particular medical conditions. Treatment is usually undertaken when these underlying conditions are causing the issue, rather than just low levels associated with aging. Treatment is important for these men because, in addition to symptoms, low testosterone levels have been associated with decreased muscle mass and strength, osteoporosis, depression, decreased cognition, ED, and metabolic syndrome. Testosterone replacement therapy (TRT) has been shown to increase lean body mass, improve bone mineral density, increase cognitive performance, and improve sexual function.     Diagnosis  Currently, there is no single consensus statement regarding diagnosis and management of hypogonadism. In general, the diagnosis requires a low serum testosterone level measured in the morning hours coupled with at least one clinical symptom of low testosterone. Absolute ranges of normal testosterone levels are difficult to establish. Therefore, treatment is generally geared toward improvement of clinical symptoms rather than an absolute serum testosterone level.     Treatment options  There are many options for TRT, each of which has its benefits and disadvantages. The decision about which one is right for you will depend on your personal preferences and a discussion with your provider. In some cases, different insurance companies may cover one option and not another, which may also be taken into consideration. If the desired effects are not achieved with your initial choice, a different option can be tried to see if it is a better fit for you. A summary of the most commonly used TRT options is provided below.    Topical Gels: Advantages include more constant levels with daily dosing, high patient satisfaction, and avoidance of needles. Disadvantages include increased cost compared to injectables, the potential for transference of the gel to others (e.g., spouses and young children) through contact with your skin or clothes, messiness of gel application, and potential skin irritation.     Injectables: Advantages include efficacy and patient satisfaction, weekly to biweekly dosing, and low cost. Disadvantages include increased fluctuation (peaks and valleys) in testosterone levels compared to daily dosing options and the requirement for needles and self-injection. There is a higher risk of elevation of the hematocrit (red blood cell count) which would require dosing changes or blood donation.    Implantable (Testopel): The advantages of this therapy include convenience and decreased frequency of dosing. As this requires a short office procedure, there are risks including bleeding, infection, and pellet extrusion in less than 1% of cases.    Monitoring  Regardless of the type of testosterone replacement therapy chosen, you will need to be monitored at regular intervals (usually every 3-6 months); both to confirm good control of your symptoms and to ensure that there are no potentially dangerous side effects. The follow-up regimen usually consists of the following:  1. Physical examination, including digital rectal exam to rule out prostate nodules (yearly).  2. Routine blood work for testosterone levels and other hormones (every 3-6 months).  3. Routine blood work for lipids, hemoglobin and hematocrit, and PSA (prostate-specific antigen) (every 6 months).    Risks  Historically, the concern for TRT was its effect on the prostate. Despite evidence that the prostate does enlarge slightly on TRT,  no studies have shown any significant worsening of urinary symptoms while on therapy. Studies have also demonstrated no significant change in PSA while on therapy. An increasing PSA while on TRT may indicate underlying malignancy and warrants evaluation. There has been no increased risk of prostate cancer demonstrated with TRT. Additionally, studies have demonstrated no increased risk of recurrence in men on TRT after undergoing treatment for prostate cancer. Small studies of men with active prostate cancer have shown no progression of disease on TRT. Until larger studies are published, TRT should be avoided in men with known or suspected active prostate cancer.    Another major risk to be aware of is the almost certain risk to a man???s fertility. Men of reproductive age or men who may want to preserve their fertility for the future should not use testosterone. There are other medications and treatment options for these men, so be sure to discuss this with Dr. Acie Acosta if you are concerned about preserving your fertility. Testosterone almost uniformly lowers sperm counts, sometimes to zero counts, and may irreversibly affect a man???s fertility.        Understanding the Cardiovascular Risk of Testosterone Replacement  Lera Rank, MD. The Journal of Sexual Medicine Volume 11, Issue 6, Article first published online: 13 Nov 2012    Two recent articles published in medical journals have suggested that men who received testosterone prescriptions were at greater risk of developing cardiovascular problems such as heart attack, stroke, and even death. Although these articles prompted a great amount of media attention, after careful scientific review many prominent experts on testosterone therapy have concluded that neither of these studies provides any definite evidence of cardiac risk from testosterone therapy. On the contrary, a rich literature spanning more than 30 years strongly suggests that increased risk of cardiovascular events is associated with low levels of testosterone; furthermore, testosterone therapy may provide cardiovascular benefits. Despite this, all patients should know that, based on these studies, the FDA has released a warning that there is a possible increased cardiovascular risk associated with testosterone use.    The first article was published in the Journal of the American Medical Association (JAMA) in November 2013 [1]. It analyzed data obtained from men who had undergone cardiac catheterization for possible heart problems, and identified men with low levels of testosterone. Some of those men eventually received a prescription for testosterone, and others did not. The investigators looked back at several years of data on these groups to determine rates of heart attacks, strokes, and deaths. They reported that although there was no significant difference in rates of these events at 1 year, 2 years, or 3 years, the overall trend over the course of the study showed a 29% increase in men who received testosterone prescriptions.    Strangely, the actual percentage of individuals who had any of these CV events was lower by half among the men who received T therapy (10.1%) compared with untreated men (21.2%). The investigators came to the opposite conclusion by complex statistics in which an event in a testosterone-treated man was given more ???weight??? than the same event in an untreated man. Moreover, the authors improperly excluded a large number of men who started testosterone after they had a heart attack or other event. If those men had been included in the ???no T??? group (which would have been appropriate as they were not taking T at the time of the event), the study would likely have concluded that men who received testosterone were less likely  to have cardiac events or strokes. The validity of this study has been questioned further due to new revelations of large errors in data. A large group of world experts have called for retraction of the study, concluding that these errors make the study ???no longer credible.    The second article was published in the journal PLoS One in January 2014 [2]. It analyzed health insurance data such as diagnosis codes and prescriptions, and reported that men who received a testosterone prescription had a greater risk of non-fatal heart attack in the short period (up to 90 days) after receiving the prescription than in the prior 12 months. Although the media made a big deal out of this study, the actual increased risk was just over 1 event for every 1,000 years of testosterone use. Such a tiny number is meaningless. More importantly, since the information was not obtained as part of any experiment, it is impossible to know whether this miniscule increase in risk was due to the prescription, or the underlying condition of low testosterone, or to other factors, such as age or smoking.    Cardiovascular Safety  Two previous studies have shown that men with low levels of testosterone who received treatment had a reduction in mortality by half compared with untreated men [3,4]. Numerous studies have shown that men with low levels of testosterone die sooner than men with normal testosterone levels [5]. Placebo-controlled studies have shown that men with congestive heart failure treated with testosterone were able to walk farther during testing than men who received placebo. Men with known coronary artery disease who received testosterone therapy were able to exercise longer on a treadmill without chest pain than men who received placebo. Increased ability to exercise may help prevent heart problems from progressing in some men. In addition, testosterone therapy improves risk factors for heart disease, such as reducing fat mass, lowering blood sugar, and reducing waist circumference. Although definitive large studies are yet to be completed, there is a great deal of evidence which strongly suggests that having a normal testosterone level, naturally or with T therapy, is beneficial for cardiovascular health.    If the Studies Linking T to Heart Problems Were So Weak, Why Did They Receive So Much Attention?  Unfortunately, there are many reasons why journal articles receive media attention, and it is not always on the strength of the science. Sensational headlines about risks of death or disease are always of interest, particularly when they are about a therapy (such as testosterone) that many people are using or are interested in using.    References:  1 Vigen R, O???Donnell CI, Bar??n AE, [address] [place], [address], [address], [place], [place], [place], Rumsfeld JS, [place] PM. Association of testosterone therapy with mortality, myocardial infarction, and stroke in men with low  testosterone levels. JAMA 2013;310:1829-36.  2 Finkle WD, [place] S, [place], [place], [place], [place], Valentine RN. Increased risk of non-fatal myocardial infarction following testosterone therapy prescriptions in men. PLoS ONE 2014;9:e85805.  3 Shores MM, Smith NL, Forsberg CW, Anawalt BD, Matsumoto AM. Testosterone treatment and mortality in men with low testosterone levels. J Clin Endocrinol Metab 2012;97:2050-8.  4 Muraleedharan Gardner Jun, Kapoor D, [facility]. Testosterone deficiency is associated with increased risk of mortality and testosterone  replacement improves survival in men with type 2 diabetes. Eur J Endocrinol 2013  ;169:725-33.  5 Traish AM, Miner MM, Morgentaler A, Zitzmann M. Testosterone deficiency.  Am J Med [phone].  6 Clarissa Croon, Gerardine Knock, Morgentaler A. A new era of testosterone and prostate cancer: From physiology to clinical implications. Eur Urol [phone].      The AUA Position Statement On Testosterone Therapy (February 2014)  [email or web address]    The American Urological Association (AUA) has followed closely the recent media attention regarding reports that testosterone therapy increases cardiovascular events in men as well as the FDA's stated intent to review cardiovascular risk with this treatment in men with hypogonadism. The AUA notes there is also contradictory evidence suggesting a beneficial influence of testosterone therapy on cardiovascular risk. Definitive studies have not been performed.    The AUA is also concerned about the potential for misuse of testosterone for non-medical indications, such as body building or performance enhancement.    Hypogonadism is defined as biochemically low testosterone levels in the setting of a cluster of symptoms which may include reduced sexual desire (libido) and activity, decreased spontaneous erections, decreased energy and depressed mood. Men with hypogonadism may also experience reduced muscle mass and strength and increased body fat. Hypogonadism may also contribute to reduced bone mineral density and anemia. Testosterone therapy is an appropriate treatment for hypogonadism after full discussion of potential adverse effects. Treatment requires follow-up and medical monitoring. Testosterone therapy in the absence of hypogonadism is not appropriate.    Increased awareness about hypogonadism has been stimulated by an increase in availability and diversity of patient-acceptable forms of testosterone replacement options in recent years. The management of hypogonadism should start with careful evaluation by a physician experienced in diagnosing hypogonadism. Many of the symptoms are non-specific and may be multifactorial in origin. Hence, symptoms may not be necessarily linked to hypogonadism alone. This fact needs to be considered in the overall evaluation.    The diagnosis and management of testosterone deficiency should be made by a physician with training in the condition and its treatments. The diagnosis should be made only after taking detailed medical history, physical examination, and obtaining appropriate blood tests. Testosterone therapy should not be offered to men with normal testosterone levels. Testosterone therapy is never a treatment for infertility.    The potential adverse effects of testosterone therapy should be discussed prior to treatment. These include acne, breast swelling or tenderness, increased red blood cell count, swelling of the feet or ankles, reduced testicular size and infertility. Current evidence does not provide any definitive answers regarding the risks of testosterone therapy on prostate cancer and cardiovascular disease, and patients should be so informed.    The optimal follow-up of men on testosterone therapy has not been defined, but should include measurement of testosterone level, PSA and hematocrit. Other patient-specific measures may be appropriate.    The AUA recognizes and encourages the need for increased educational awareness of the benefits and risks of testosterone therapy among both patients and health care providers.      FDA Drug Safety Communication: FDA cautions about using testosterone products for low testosterone due to aging; requires labeling change to inform of possible increased risk of heart attack and stroke with use (08/10/2013)    The U.S. Food and Drug Administration (FDA) cautions that prescription testosterone products are approved only for men who have low testosterone levels caused by certain medical conditions. The benefit and safety of these medications have not been established for the treatment of low testosterone levels due to aging, even if a man???s symptoms seem related to low testosterone. We are requiring that the manufacturers  of all approved prescription testosterone products change their labeling to clarify the approved uses of these medications. We are also requiring these manufacturers to add information to the labeling about a possible increased risk of heart attacks and strokes in patients taking testosterone. Health care professionals should prescribe testosterone therapy only for men with low testosterone levels caused by certain medical conditions and confirmed by laboratory tests.    Testosterone is FDA-approved as replacement therapy only for men who have low testosterone levels due to disorders of the testicles, pituitary gland, or brain that cause a condition called hypogonadism. Examples of these disorders include failure of the testicles to produce testosterone because of genetic problems, or damage from chemotherapy or infection. However, FDA has become aware that testosterone is being used extensively in attempts to relieve symptoms in men who have low testosterone for no apparent reason other than aging. The benefits and safety of this use have not been established.    In addition, based on the available evidence from published studies and expert input from an Advisory Committee meeting, FDA has concluded that there is a possible increased cardiovascular risk associated with testosterone use. These studies included aging men treated with testosterone. Some studies reported an increased risk of heart attack, stroke, or death associated with testosterone treatment, while others did not.    Based on our findings, we are requiring labeling changes for all prescription testosterone products to reflect the possible increased risk of heart attacks and strokes associated with testosterone use. Health care professionals should make patients aware of this possible risk when deciding whether to start or continue a patient on testosterone therapy. We are also requiring manufacturers of approved testosterone products to conduct a well-designed clinical trial to more clearly address the question of whether an increased risk of heart attack or stroke exists among users of these products. We are encouraging these manufacturers to work together on a clinical trial, but they are allowed to work separately if they so choose.    Patients using testosterone should seek medical attention immediately if symptoms of a heart attack or stroke are present, such as:    Chest pain  Shortness of breath or trouble breathing  Weakness in one part or one side of the body  Slurred speech

## 2023-10-21 NOTE — Unmapped (Signed)
 UROLOGY CLINIC NOTE     Patient Name: Logan Quinn.  Patient Age: 43 y.o.  Encounter Date: 10/21/2023    Referring Provider:   Vicente Graham None Per Patient  [address] [place],  [place] [address]    PCP:   Baldwin Bones, FNP    Reason for Visit:   Chief Complaint   Patient presents with    Hypogonadism     Assessment:  43 y.o. male with diabetes, obesity, OSA on CPAP    Hypogonadism/Low Libido  Symptoms improved on testosterone. History of holding TRT due to fertility concerns, now done with children  On 0.57ml (80mg ) weekly (Thursdays) + anastrazole every day given elevated T    T labs 03/2023: elevated Testosterone (1094, previously 619 and 911), elevated estradiol (167.8, previously 52.3 and 148.4), hematocrit stable at 47.4. At the time patient and I had discussed options for adjusting medications. We increased anastrozole from every other day to daily to assess if lowered T, as this had worked for patient in the past. Plan was to recheck labs in 1 month, but this was not done. Labs were recently checked in context of patient skipping a testosterone dose: T 178, Hematocrit 49.4, estradiol 69.1.    Will check T and Hematocrit again tomorrow when patient is at [facility] to ensure accuracy. If stable, will refill doses with no change. If T elevated, reduce Testosterone from 0.4 to 0.3ml weekly. Will continue anastrozole daily regardless given lack of side effects.    Counseling of Testosterone Replacement Therapy (TRT):  Options include observation with implementation of dietary/exercise strategies versus testosterone replacement (with gel, injections, pellets, etc) and alternative therapies such as Clomid and/or Arimidex to increase natural production. Losing weight is a means of naturally improving testosterone levels. According to findings presented at the annual meeting of the Endocrine Society in 2012, obese men who lost an average of 17 pounds saw their testosterone levels increase by 15 percent. Some studies suggest dietary modifications including drinking less alcohol, and eating a well-balanced diet with lots of vegetables can also help.  Also stress management and improved sleep habits.     Risks of TRT including but not limited to acne, fluid retention, BPH or urinary symptoms, breast enlargement, worsening of sleep apnea, infertility, changes in blood count requiring phlebotomy, changes in cholesterol requiring additional medical therapy, blood clots including deep venous thrombosis or thromboembolic events, and prostate cancer progression. We also discussed recent literature suggesting an increase in cardiovascular events (including heart attack/stroke) and mortality, but we also reviewed that there is conflicting literature in this regard.      We discussed that he will need to be followed at regular intervals for physical exam, reassessment of symptoms, and lab work including hormone level and hematocrit. Also discussed PSA (last checked in 2019; patient plans to defer until 51). After being fully advised of these risks and expressing understanding, he would like to continue therapy. He was given a handout delineating the importance of regular follow-up and risks of testosterone replacement therapy, including those noted above.      ED   Daily 5mg  cialis is working well for patient; refill provided x 1 year to [facility]      Plan:  - Recheck T/Hematocrit tomorrow when at [facility]  - If stable, will refill and continue Testosterone cypionate 200 mg/ ml: 0.4 mL (80mg ) weekly to local pharmacy  - Continue Anastrazole daily  - Continue daily cialis 5mg ; refilled to [facility]  -  If all stable, hormone profile in 6 months (hematocrit, estradiol, testosterone) with telehealth visit for routine follow-up     HPI:   Logan Quinn is a 43 y.o. male with diabetes, obesity, OSA on CPAP, here for follow-up of ED and hypogonadism    Previously followed by Dr. Hiram Lukes    Hypogonadism  Slow decline in erectile function and libido   Noted prior low T values of 105 and 122   Held on TRT due to fertility concerns, now done with children  T cyp 100mg  IM weekly initiated 02/2022, T bumped to 911, so reduced to 80mg  (0.81ml) weekly  T 08/2022 619  Took over care of patient in 2024. He is taking 80mg  (0.40ml) on Thursdays. Anastrozole 1mg  po every other day (taking M, W, F) initiated for elevated estradiol. Anastrozole changed to daily when testosterone rose to >1000 in 2024, as patient requested to do this prior to reducing testosterone dose. No side effects with increasing anastrozole dose.    10/21/23 notes he is doing well today. Pleased with therapy. T 178, Hematocrit 49.4, estradiol 69.1. Skipped a testosterone dose when these recent labs were done, so level has not been accurately rechecked since increasing anastrozole to daily.      ED   On daily 5mg  Cialis, working great, no concerns, requesting refill    Paternal grandfather had metastatic bone cancer  Maternal grandfather had either stomach or kidney cancer.    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    Liver disease     Migraines     Mild intermittent asthma without complication (HHS-HCC) 10/11/2021    Morbid obesity with BMI of 60.0-69.9, adult (CMS-HCC)     Neuropathy in diabetes       Obstructive sleep apnea     OSA on CPAP     Retinal detachment 04/2014    Severe obstructive sleep apnea     Trapezius muscle strain 12/07/2013    Urinary incontinence, nocturnal enuresis     Venous insufficiency        Past Surgical History:  Past Surgical History:   Procedure Laterality Date    COLONOSCOPY      CYSTOSCOPY      EYE SURGERY  04/2014    LASER ABLATION INCOMPETENT VEIN INI [facility] HISTORICAL RESULT) Right     PR COLONOSCOPY FLX DX W/COLLJ SPEC WHEN PFRMD N/A 03/24/2013    Procedure: COLONOSCOPY, FLEXIBLE, PROXIMAL TO SPLENIC FLEXURE; DIAGNOSTIC, W/WO COLLECTION SPECIMEN BY BRUSH OR WASH;  Surgeon: Beatriz Bouillon, MD;  Location: [facility];  Service: Gastroenterology    PR EYE SURG POST SGMT PROC UNLISTED Left     pneumatic retinopexy OS    PR UPPER GI ENDOSCOPY,DIAGNOSIS N/A 02/02/2013    Procedure: UGI ENDO, INCLUDE ESOPHAGUS, STOMACH, & DUODENUM &/OR JEJUNUM; DX W/WO COLLECTION SPECIMN, BY BRUSH  OR WASH;  Surgeon: Aloha Jakes, MD;  Location: [facility];  Service: Gastroenterology    SKIN BIOPSY      TOOTH EXTRACTION  10/06/2023    UPPER GASTROINTESTINAL ENDOSCOPY      US  PYLORIC STENOSIS ([place] HISTORICAL RESULT)          Social History:  Patient  reports that he has never smoked. He has never been exposed to tobacco smoke. He has never used smokeless tobacco. He reports that he does not drink alcohol and does not use drugs. Married, 1 child    Family History:  The patient's family history includes Angina in his maternal grandmother and mother; Arthritis in his mother and paternal grandmother; Asthma in his brother, brother, brother, brother, sister, and sister; Broken bones in his brother; COPD in his mother and paternal grandmother; Cancer in his maternal grandfather and paternal grandfather; Cataracts in his mother; Depression in his maternal grandmother, maternal uncle, mother, paternal grandmother, sister, and sister; Diabetes in his brother, brother, maternal grandmother, maternal uncle, maternal uncle, maternal uncle, mother, sister, and sister; GER disease in his maternal grandfather, maternal grandmother, and mother; Glaucoma in his mother; Gout in his father; Hearing loss in his maternal grandfather, maternal uncle, maternal uncle, mother, sister, and sister; Heart disease in his maternal grandmother and mother; Hypertension in his maternal grandmother and mother; Kidney disease in his maternal uncle; Liver disease in his maternal uncle; Migraines in his maternal grandmother, mother, sister, and sister; Miscarriages / Stillbirths in his mother; No Known Problems in his paternal aunt, paternal uncle, and another family member; Osteoporosis in his paternal grandmother; Stroke in his maternal grandmother and mother.     Medications:  Current Outpatient Medications   Medication Sig Dispense Refill    allopurinol (ZYLOPRIM) 300 MG tablet Take 1 tablet (300 mg total) by mouth daily. 90 tablet 3    ALPRAZolam (XANAX) 0.5 MG tablet Takes 1 tablet nightly and 1 tablet a day when needed      ascorbic acid (VITAMIN C ORAL) Take 1 capsule by mouth nightly.      atorvastatin (LIPITOR) 20 MG tablet Take 1 tablet (20 mg total) by mouth daily. 90 tablet 3    blood sugar diagnostic (ACCU-CHEK GUIDE TEST STRIPS) Strp by Other route Three (3) times a day before meals. 300 strip 3    blood-glucose meter kit Use as directed 1 each 0    budesonide-formoterol (SYMBICORT) 80-4.5 mcg/actuation inhaler Inhale 2 puffs two (2) times a day. 10.2 g 0    clindamycin (CLEOCIN T) 1 % lotion Apply topically two (2) times a day. To affected areas on body as needed for flares 60 mL 3    clindamycin (CLEOCIN) 150 MG capsule Take 1 capsule (150 mg total) by mouth Three (3) times a day.      clobetasol (TEMOVATE) 0.05 % ointment Apply to HS lesions twice daily for 5-7 days as needed for flares 60 g 1    cyclobenzaprine (FLEXERIL) 10 MG tablet Take 1 tablet (10 mg total) by mouth Three (3) times a day as needed for muscle spasms. 60 tablet 1    dextroamphetamine sulfate (DEXTROSTAT) 10 MG tablet Take 3 tablets (30 mg total) by mouth two (2) times a day.      dicyclomine (BENTYL) 10 mg capsule Take 1 capsule (10 mg total) by mouth Four (4) times a day (before meals and nightly). 120 capsule 1    dulaglutide (TRULICITY) 3 mg/0.5 mL injection pen Inject  0.5 mL (3 mg total) under the skin every seven (7) days. 2 mL 2    DULoxetine (CYMBALTA) 60 MG capsule Take 1 capsule (60 mg total) by mouth Two (2) times a day. 180 capsule 0    empty container Misc Use as directed to dispose of Cosentyx pens. 1 each 2    erenumab-aooe (AIMOVIG AUTOINJECTOR) auto-injector Inject 1 Pen under the skin every thirty (30) days. 3 mL 3    famotidine (PEPCID) 20 MG tablet Take 1 tablet (20 mg total) by mouth two (2) times a day. 180 tablet 3    fluconazole (DIFLUCAN) 150 MG tablet Take 150 mg now and repeat 150 mg in 5-7 days. 2 tablet 0    fluocinonide (LIDEX) 0.05 % gel Apply 1 Application topically Three (3) times a day.      fluoride, sodium, 1.1 % Pste       fluticasone propionate (FLONASE) 50 mcg/actuation nasal spray 1 spray into each nostril two (2) times a day. 16 g 11    glipiZIDE (GLUCOTROL) 10 MG tablet Take 2 tablets (20 mg total) by mouth Two (2) times a day (30 minutes before a meal). 360 tablet 3    HUMALOG U-100 INSULIN 100 unit/mL injection INJECT 120 UNITS UNDER THE SKIN DAILY VIA OMNIPOD 100 mL 3    hydrOXYzine (ATARAX) 25 MG tablet Take 1 tablet (25 mg total) by mouth Three (3) times a day as needed. May take two before bed for sleep. 120 tablet 1    ibuprofen (ADVIL,MOTRIN) 800 MG tablet Take 1 tablet (800 mg total) by mouth every eight (8) hours as needed. 90 tablet 1    insulin glargine (LANTUS U-100 INSULIN) 100 unit/mL injection Inject 0.2 mL (20 Units total) under the skin nightly. 18 mL 3    insulin syringe-needle U-100 (BD INSULIN SYRINGE ULTRA-FINE) 1 mL 31 gauge x 5/16 (8 mm) Syrg Once daily for insulin dosing. 100 each 3    insulin syringe-needle U-100 1 mL 31 gauge x 5/16 (8 mm) Syrg Once daily for insulin dosing. 100 each 3    lancets (ACCU-CHEK FASTCLIX LANCET DRUM) Misc Use to check blood sugars 3 times a day before meals or as directed 200 each 5    levothyroxine (SYNTHROID) 50 MCG tablet Take 1 tablet (50 mcg total) by mouth daily. 90 tablet 1    lidocaine (XYLOCAINE) 5 % ointment Apply topically Three (3) times a day as needed. 30 g 1    losartan (COZAAR) 50 MG tablet Take 1 tablet (50 mg total) by mouth daily. 90 tablet 3    montelukast (SINGULAIR) 10 mg tablet Take 1 tablet (10 mg total) by mouth daily. 90 tablet 3    multivitamin (MULTIPLE VITAMIN ORAL) Take 1 tablet by mouth daily.      OMNIPOD 5 PACK PODS Omnipod 5 G6/G7 system POD (Gen 5) (5 PODS) Change POD every 48 hours 3 each 11    ondansetron (ZOFRAN) 4 MG tablet Take 1 tablet (4 mg total) by mouth every eight (8) hours as needed for nausea. 30 tablet 1    oxybutynin (DITROPAN) 5 MG tablet Take 1 tablet (5 mg total) by mouth two (2) times a day. 180 tablet 1    propranoloL (INDERAL LA) 120 mg 24 hr capsule Take 1 capsule (120 mg total) by mouth daily. 90 capsule 1    ramelteon (ROZEREM) 8 mg tablet Take 1 tablet (8 mg total) by mouth nightly.      safety needles (  BD SAFETYGLIDE NEEDLE) 18 gauge x 1 1/2 Ndle USE ONE NEEDLE ONCE WEEKLY      secukinumab (COSENTYX UNOREADY PEN) 300 mg/2 mL PnIj Inject the contents of 1 pen (300 mg) under the skin once weekly for 5 weeks (on weeks 0,1,2,3,4) for loading dose. 10 mL 0    sour cherry extract (TART CHERRY EXTRACT) 1,000 mg cap       syringe with needle (BD LUER-LOK SYRINGE) 3 mL 21 gauge x 1 1/2 Syrg To draw up testosterone once weekly 100 each 0    tadalafil (CIALIS) 5 MG tablet Take 1 tablet (5 mg total) by mouth in the morning. 90 tablet 3    testosterone cypionate (DEPOTESTOTERONE CYPIONATE) 200 mg/mL injection Inject 0.5 mL (100 mg total) into the muscle once a week. Inject 0.5cc (100mg ) weekly 2 mL 0    topiramate (TOPAMAX) 50 MG tablet Take 1.5 tablets (75 mg total) by mouth two (2) times a day. 270 tablet 3    traMADol (ULTRAM) 50 mg tablet Take 1 tablet (50 mg total) by mouth every six (6) hours. As needed for pain 20 tablet 0    WELLBUTRIN XL 150 mg 24 hr tablet Take 1 tablet (150 mg total) by mouth every morning.       No current facility-administered medications for this visit.       Allergies:  Erythromycin, Penicillins, Sulfa (sulfonamide antibiotics), Other, and Metronidazole     ROS:   As per HPI.  The patient was asked to review all abnormal responses not pertinent to today's visit with their primary care provider.     Vitals  There were no vitals taken for this visit.    Physical Exam:  Limited due to telehealth  GENERAL: Pleasant, well nourished, in no acute distress.   HEENT: Normocephalic and atraumatic.   PULMONARY: Relaxed respiratory effort on room air.   CARDIOVASCULAR: No JVD.  GASTROINTESTINAL:   GENITOURINARY:   SKIN: No signs of cyanosis.  NEUROLOGICAL: Grossly intact.   PSYCH: Alert and oriented x 3.        Labs Reviewed:  Lab Results   Component Value Date    WBC 7.3 01/15/2022    HGB 14.3 01/15/2022    HCT 49.4 (H) 10/06/2023    PLT 187 01/15/2022       Lab Results   Component Value Date    NA 139 03/05/2023    K 3.9 03/05/2023    CL 106 03/05/2023    CO2 25.0 03/05/2023    BUN 9 03/05/2023    CREATININE 0.92 03/05/2023    CALCIUM 9.6 03/05/2023    MG 1.8 12/07/2012    PHOS 4.1 12/07/2012       Lab Results   Component Value Date/Time    PSA 0.35 04/22/2018 02:49 PM       Lab Results   Component Value Date/Time    TESTOSTERONE 178 (L) 10/06/2023 11:29 AM    TESTOSTERONE 1,094 (H) 03/31/2023 11:39 AM    TESTOSTERONE 619 08/19/2022 02:26 PM    TESTOSTERONE 911 (H) 05/13/2022 01:03 PM    TESTOSTERONE 105 (L) 01/09/2021 11:57 AM    TESTOSTERONE 122 (L) 03/24/2019 04:44 PM    TESTOSTERONE 124 (L) 04/22/2018 02:49 PM    TESTOSTERONE 84 (L) 04/07/2018 01:00 PM    TESTOSTERONE 92 (L) 04/01/2018 10:02 AM

## 2023-10-22 ENCOUNTER — Encounter: Payer: Self-pay | Admitting: Physical Therapy

## 2023-10-22 ENCOUNTER — Ambulatory Visit

## 2023-10-22 ENCOUNTER — Ambulatory Visit: Admit: 2023-10-22 | Discharge: 2023-10-23 | Payer: Medicare (Managed Care)

## 2023-10-22 DIAGNOSIS — E291 Testicular hypofunction: Principal | ICD-10-CM

## 2023-10-22 DIAGNOSIS — M25672 Stiffness of left ankle, not elsewhere classified: Secondary | ICD-10-CM

## 2023-10-22 DIAGNOSIS — R269 Unspecified abnormalities of gait and mobility: Secondary | ICD-10-CM

## 2023-10-22 DIAGNOSIS — M6281 Muscle weakness (generalized): Secondary | ICD-10-CM

## 2023-10-22 DIAGNOSIS — M79672 Pain in left foot: Secondary | ICD-10-CM

## 2023-10-22 LAB — HEMATOCRIT: HEMATOCRIT: 45.2 % (ref 39.0–48.0)

## 2023-10-22 LAB — TESTOSTERONE: TESTOSTERONE TOTAL: 524 ng/dL (ref 197–670)

## 2023-10-22 MED ORDER — SYRINGE WITH NEEDLE 3 ML 21 GAUGE X 1 1/2"
0 refills | 0.00000 days | Status: CP
Start: 2023-10-22 — End: ?

## 2023-10-22 MED ORDER — SAFETY NEEDLES 18 GAUGE X 1 1/2"
INTRAMUSCULAR | 0 refills | 0.00000 days | Status: CP
Start: 2023-10-22 — End: ?

## 2023-10-22 MED ORDER — ANASTROZOLE 1 MG TABLET
ORAL_TABLET | Freq: Every day | ORAL | 11 refills | 30.00000 days | Status: CP
Start: 2023-10-22 — End: 2024-10-21

## 2023-10-22 MED ORDER — TESTOSTERONE CYPIONATE 200 MG/ML INTRAMUSCULAR OIL
INTRAMUSCULAR | 5 refills | 70.00000 days | Status: CP
Start: 2023-10-22 — End: ?

## 2023-10-22 NOTE — Unmapped (Signed)
 Updated labs: T 524, Hematocrit 45.2 Will refill testosterone as is at 80mg  (0.72ml)/week and continue anastrozole daily.

## 2023-10-23 ENCOUNTER — Encounter: Admitting: Physical Therapy

## 2023-10-23 NOTE — Unmapped (Signed)
 [facility]-- Medication Adherence  UNREACHABLE    To patients reading this note:  Based on information provided by your insurance, the primary purpose of this chart review is to determine the need and/or appropriateness of statin therapy. Any suggested changes are intended to enhance the overall care you receive.      Population:  [employer]      Logan D Viraaj Vorndran. is a 43 y.o. male identified as being part of  SUPD (Statin Use for Patients with Diabetes)quality measure(s), based on payer reports and chart review.     1st attempt call made via phone.  Unable to reach. A letter has been sent explaining program services.        Logan Quinn, CPP , PharmD  [facility] ([facility])

## 2023-10-25 DIAGNOSIS — Z794 Long term (current) use of insulin: Principal | ICD-10-CM

## 2023-10-25 DIAGNOSIS — E1165 Type 2 diabetes mellitus with hyperglycemia: Principal | ICD-10-CM

## 2023-10-25 MED ORDER — TRULICITY 1.5 MG/0.5 ML SUBCUTANEOUS PEN INJECTOR
0 refills | 0.00000 days
Start: 2023-10-25 — End: ?

## 2023-10-27 ENCOUNTER — Encounter: Payer: Self-pay | Admitting: Physical Therapy

## 2023-10-27 ENCOUNTER — Ambulatory Visit: Admitting: Physical Therapy

## 2023-10-27 DIAGNOSIS — M79672 Pain in left foot: Secondary | ICD-10-CM

## 2023-10-27 DIAGNOSIS — R269 Unspecified abnormalities of gait and mobility: Secondary | ICD-10-CM

## 2023-10-27 DIAGNOSIS — M6281 Muscle weakness (generalized): Secondary | ICD-10-CM

## 2023-10-27 DIAGNOSIS — M25672 Stiffness of left ankle, not elsewhere classified: Secondary | ICD-10-CM

## 2023-10-27 MED ORDER — TRULICITY 1.5 MG/0.5 ML SUBCUTANEOUS PEN INJECTOR
0 refills | 0.00000 days
Start: 2023-10-27 — End: ?

## 2023-10-27 NOTE — Therapy (Signed)
 OUTPATIENT PHYSICAL THERAPY LOWER EXTREMITY TREATMENT  Patient Name: Jason Davidson MRN: [id number] DOB:[birth year], 43 y.o., male Today's Date: 10/27/2023  END OF SESSION:  PT End of Session - 10/27/23 1204     Visit Number 8    Number of Visits 14    Date for PT Re-Evaluation 11/17/23    PT Start Time 1202    PT Stop Time 1241    PT Time Calculation (min) 39 min    Activity Tolerance Patient tolerated treatment well    Behavior During Therapy WFL for tasks assessed/performed            Past Medical History:  Diagnosis Date   Asthma    Depressed    Diabetes mellitus without complication (HCC)    Gout    IBS (irritable bowel syndrome)    Morbid obesity (HCC)    Sleep apnea    Thyroid disease    Past Surgical History:  Procedure Laterality Date   COLONOSCOPY     ESOPHAGOGASTRODUODENOSCOPY ENDOSCOPY     EYE SURGERY     LASER ABLATION INCOMPETENT VEIN INI     Patient Active Problem List   Diagnosis Date Noted   Sprain of left ankle 07/03/2022   Tinea pedis 03/18/2022   Chronic pansinusitis 11/28/2021   Cough variant asthma 11/28/2021   Environmental and seasonal allergies 11/28/2021   Erectile dysfunction 11/28/2021   Nasal congestion 11/28/2021   Sinus congestion 11/28/2021   Mild intermittent asthma without complication 10/11/2021   Essential hypertension 10/10/2021   Acquired hypothyroidism 01/09/2021   Acute non-recurrent pansinusitis 07/19/2020   Hidradenitis suppurativa 04/29/2018   Allergic rhinitis 12/23/2017   Varicose veins of both lower extremities with pain 10/16/2017   IBS (irritable bowel syndrome) 06/18/2017   Chronic venous insufficiency 12/31/2016   Lymphedema 12/31/2016   Pain in joint, ankle and foot 12/31/2016   Diabetes (HCC) 12/31/2016   GERD (gastroesophageal reflux disease) 12/31/2016   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   H/O retinal detachment 10/01/2016   No diabetic retinopathy in both eyes  10/01/2016   Cellulitis of right lower extremity 06/19/2016   Chronic joint pain 11/16/2015   Skin infection 05/15/2015   Depressed    Tick bite 10/20/2014   Primary insomnia 07/08/2014   Candidal balanitis 02/16/2014   Skin tag 01/17/2014   Left shoulder pain 10/04/2013   Anxiety 09/13/2013   Diarrhea 09/13/2013   Bleeding external hemorrhoids 09/03/2013   NAFLD (nonalcoholic fatty liver disease) 66/44/0347   Headache 07/05/2013   Abdominal pain 03/05/2013   Morbid (severe) obesity due to excess calories (HCC) 03/05/2013   Suicidal ideation 02/09/2013   Obstructive sleep apnea (adult) (pediatric) 07/21/2012   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    PCP: Laurence Pons, NP  REFERRING PROVIDER: Jeni Mitten, DPM  REFERRING DIAG: M72.2 (ICD-10-CM) - Plantar fasciitis of left foot   THERAPY DIAG:  Gait difficulty  Pain in left foot  Ankle joint stiffness, left  Muscle weakness (generalized)  Rationale for Evaluation and Treatment: Rehabilitation  ONSET DATE: Chronic  SUBJECTIVE:   SUBJECTIVE STATEMENT: Pt. Reports chronic h/o L foot/ ankle pain.  Pt. Had a fall over a year ago with L ankle issues.  Pt. Has over the counter orthotics and wearing New Balance shoes.  Pt. Received 2 injections to foot in past and occasional using ankle brace/ strap to mange pain symptoms.   PERTINENT HISTORY:   Subjective:  Patient ID: [name], male    DOB:  [birth year],  MRN: [id number]       Chief Complaint  Patient presents with   Plantar Fasciitis      "It's hurting."      43 y.o. male presents with the above complaint. History confirmed with patient.  He has been dealing with heel pain for some time and the injection he had at his last visit with Dr. Lara Plants was helpful, has worn off the meloxicam  did not help much, brace was helping as well as it broke recently and the strap was not working more   Objective:  Physical Exam: warm, good capillary refill, no trophic  changes or ulcerative lesions, normal DP and PT pulses, normal sensory exam, and pain on palpation of plantar medial heel at insertion of plantar fascia.     Radiographs: Previous x-rays taken December 24 showed no stress fracture or heel spur Assessment:    1. Plantar fasciitis of left foot         Plan:  Patient was evaluated and treated and all questions answered.   Discussed the etiology and treatment options for plantar fasciitis including stretching, formal physical therapy, supportive shoegears such as a running shoe or sneaker, pre fabricated orthoses, injection therapy, and oral medications. We also discussed the role of surgical treatment of this for patients who do not improve after exhausting non-surgical treatment options.     -Educated patient on stretching and icing of the affected limb -Plantar fascial brace dispensed again his previous brace failed.  Replacement was given today -Injection delivered to the plantar fascia of the left foot. -Rx for diclofenac . Educated on use, risks and benefits of the medication   After sterile prep with povidone-iodine solution and alcohol, the left heel was injected with 0.5cc 2% xylocaine  plain, 0.5cc 0.5% marcaine plain, 10mg  triamcinolone  acetonide, and 4mg  dexamethasone  was injected along the medial plantar fascia at the insertion on the plantar calcaneus. The patient tolerated the procedure well without complication.   Return if symptoms worsen or fail to improve.      PAIN:  Are you having pain? Yes: NPRS scale: 5/10 Pain location: L heel Pain description: sharp/ persistent Aggravating factors: walking Relieving factors: rest/ injections  PRECAUTIONS: Fall  RED FLAGS: None   WEIGHT BEARING RESTRICTIONS: No  FALLS:  Has patient fallen in last 6 months? 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  DIAGNOSTIC FINDINGS: CLINICAL DATA:  Fall 3 days ago with left ankle pain.   EXAM: LEFT ANKLE COMPLETE - 3+ VIEW   COMPARISON:  None Available.   FINDINGS: There is no evidence of fracture, dislocation, or joint effusion. There is no evidence of arthropathy or other focal bone abnormality. Soft tissues are unremarkable.  PATIENT SURVEYS:  LEFS 29 out of 80  COGNITION: Overall cognitive status: Within functional limits for tasks assessed     SENSATION: WFL  EDEMA:  L lower leg lymphadema  MUSCLE LENGTH: Hamstrings: limited B proximal/distal hamstring flexibility.  Thomas test: NT  POSTURE: rounded shoulders and forward head  PALPATION: (+) tenderness with palpation over L plantar aspect of foot (medial).  Good L great toe extension.    LOWER EXTREMITY ROM:  Active ROM Right eval Left eval  Hip flexion    Hip extension    Hip abduction    Hip adduction    Hip internal rotation    Hip external rotation    Knee flexion [facility]  Knee extension [facility]  Ankle dorsiflexion 11 deg. 12 deg.  Ankle plantarflexion 50 deg. 58 deg.  Ankle inversion 30 deg. 30 deg.  Ankle eversion 22 deg. 20 deg.    (Blank rows = not tested)  LOWER EXTREMITY MMT:  MMT Right eval Left eval  Hip flexion    Hip extension    Hip abduction    Hip adduction    Hip internal rotation    Hip external rotation    Knee flexion 5 5  Knee extension 5 5  Ankle dorsiflexion 5 5  Ankle plantarflexion 4 4  Ankle inversion 4+ 4+  Ankle eversion 4+ 4+   (Blank rows = not tested)  LOWER EXTREMITY SPECIAL TESTS:  NT  FUNCTIONAL TESTS:  NT  GAIT: Distance walked: in clinic Assistive device utilized: None Level of assistance: Complete Independence Comments: L antalgic  gait pattern.  Discussed use of SPC to decrease L foot/heel wt. bearing                                                                                                                              TREATMENT DATE: 10/27/23   Subjective:  Patient reports 7/10 pain in L foot currently and states he was really active yesterday at [employer]/ climbing stairs/ did not have SPC.  Pt. Continues to use inserts into his new shoes and seems help.     There.ex.:  Seated heel and toe raises 20x.    Seated calf stretch/ neural glides 3 x 30 seconds   Seated L ankle circles/ IV/ EV/ PF/ DF.    Discussed HEP  Manual:  Deep STM to L plantar fascia x 21 minutes - tenderness noted at L heel.  Supine L gastroc/ IV/ EV/ great toe extension stretches with static holds during/ after STM.     PATIENT EDUCATION:  Education details: HEP/ shoe wear/ Ice massage Person educated: Patient Education method: Explanation, Demonstration, and Handouts Education comprehension: verbalized understanding and returned demonstration  HOME EXERCISE PROGRAM: Access Code: [email or web address] URL: [email or web address] Date: 09/17/2023 Prepared by: Hazeline Lister  Exercises - Seated Gastroc Stretch with Strap  - 1 x daily - 7 x weekly - 1 sets - 5 reps - Seated Self Great Toe Stretch  - 1 x daily - 7 x weekly - 1 sets - 5 reps - Foot Roller Plantar Massage  - 1 x daily - 7 x weekly - 1 sets - 5 reps  ASSESSMENT:  CLINICAL IMPRESSION:     Session focused on manual therapy and STM to L plantar fascia as well as seated foot/ankle stretches. Tolerated session well with marked tenderness in L heel, not arch.  Continued discussion about proper supportive footwear/ inserts/ gait pattern.  Pt. Will  benefit from short-term skilled PT services to develop stretching/ strengthening ex. Program and education to improve pain-free mobility.     OBJECTIVE IMPAIRMENTS: Abnormal gait, decreased activity tolerance, decreased balance,  decreased mobility, difficulty walking, decreased ROM, decreased strength, impaired flexibility, improper body mechanics, obesity, and pain.   ACTIVITY LIMITATIONS: squatting and locomotion level  PARTICIPATION LIMITATIONS: community activity, occupation, and yard work  PERSONAL FACTORS: Fitness and Past/current experiences are also affecting patient's functional outcome.   REHAB POTENTIAL: Good  CLINICAL DECISION MAKING: Evolving/moderate complexity  EVALUATION COMPLEXITY: Moderate   GOALS: Goals reviewed with patient? Yes  LONG TERM GOALS: Target date: 11/17/23  Pt. Independent with HEP to increase L ankle stability to WNL as compared to R to improve pain-free mobility.   Baseline:  see above Goal status: Not met  2.  Pt. Will increase LEFS to >40 out of 80 to improve pain-free mobility. Baseline: 29 out of 80 Goal status: Ongoing  3.  Pt. Will reports <3/10 L foot/ankle pain while completing job/ walking about mobile home park.   Baseline: >5/10 L foot pain currently Goal status: Not met  4.  Pt. Able to complete camping trip with [employer] with no L foot pain/ limitations.   Baseline:  increase L foot pain/limitations.  Goal status: Not met  PLAN:  PT FREQUENCY: 1-2x/week  PT DURATION: 4 weeks  PLANNED INTERVENTIONS: 97110-Therapeutic exercises, 97530- Therapeutic activity, 97112- Neuromuscular re-education, 97535- Self Care, 91478- Manual therapy, [phone]- Gait training, [phone]- Ionotophoresis 4mg /ml Dexamethasone , Patient/Family education, Balance training, Stair training, Taping, Dry Needling, Joint mobilization, Cryotherapy, and Moist heat  PLAN FOR NEXT SESSION: Issue new HEP  Lendell Quarry, PT, DPT # [phone] Physical Therapist - [facility]  10/27/2023, 1:53 PM

## 2023-10-27 NOTE — Unmapped (Signed)
 Request for lower dose of Trulicity.  Dose adjustment sent in on 10/08/2023.

## 2023-10-28 ENCOUNTER — Encounter (INDEPENDENT_AMBULATORY_CARE_PROVIDER_SITE_OTHER): Payer: Self-pay

## 2023-10-28 MED ORDER — FLUCONAZOLE 150 MG TABLET
ORAL_TABLET | ORAL | 1 refills | 0.00000 days | Status: CP
Start: 2023-10-28 — End: 2024-10-26

## 2023-10-28 NOTE — Unmapped (Addendum)
 Logan Quinn calls asking to see if you will send in yeast med.

## 2023-10-28 NOTE — Unmapped (Signed)
 Addended by: Latamara Melder M on: 10/28/2023 10:52 AM     Modules accepted: Orders

## 2023-10-28 NOTE — Unmapped (Signed)
 Addended by: Laurence Pons on: 10/28/2023 11:19 AM     Modules accepted: Orders

## 2023-10-29 ENCOUNTER — Encounter: Payer: Self-pay | Admitting: Physical Therapy

## 2023-10-29 ENCOUNTER — Ambulatory Visit: Admitting: Physical Therapy

## 2023-10-29 NOTE — Unmapped (Signed)
 Patient accepted 05/23 at 9:45 am in [facility]  [place]

## 2023-10-30 ENCOUNTER — Encounter: Admitting: Physical Therapy

## 2023-10-30 ENCOUNTER — Ambulatory Visit: Admitting: Physical Therapy

## 2023-10-30 DIAGNOSIS — R269 Unspecified abnormalities of gait and mobility: Secondary | ICD-10-CM | POA: Diagnosis not present

## 2023-10-30 DIAGNOSIS — M25672 Stiffness of left ankle, not elsewhere classified: Secondary | ICD-10-CM

## 2023-10-30 DIAGNOSIS — M79672 Pain in left foot: Secondary | ICD-10-CM

## 2023-10-30 DIAGNOSIS — M6281 Muscle weakness (generalized): Secondary | ICD-10-CM

## 2023-10-30 NOTE — Therapy (Addendum)
 OUTPATIENT PHYSICAL THERAPY LOWER EXTREMITY TREATMENT  Patient Name: Jason Davidson MRN: [id number] DOB:February 22, 1981, 43 y.o., male Today's Date: 10/30/2023  END OF SESSION:  PT End of Session - 10/30/23 1552     Visit Number 9    Number of Visits 14    Date for PT Re-Evaluation 11/17/23    PT Start Time 1552    PT Stop Time 1639    PT Time Calculation (min) 47 min    Activity Tolerance Patient tolerated treatment well    Behavior During Therapy WFL for tasks assessed/performed            Past Medical History:  Diagnosis Date   Asthma    Depressed    Diabetes mellitus without complication (HCC)    Gout    IBS (irritable bowel syndrome)    Morbid obesity (HCC)    Sleep apnea    Thyroid disease    Past Surgical History:  Procedure Laterality Date   COLONOSCOPY     ESOPHAGOGASTRODUODENOSCOPY ENDOSCOPY     EYE SURGERY     LASER ABLATION INCOMPETENT VEIN INI     Patient Active Problem List   Diagnosis Date Noted   Sprain of left ankle 07/03/2022   Tinea pedis 03/18/2022   Chronic pansinusitis 11/28/2021   Cough variant asthma 11/28/2021   Environmental and seasonal allergies 11/28/2021   Erectile dysfunction 11/28/2021   Nasal congestion 11/28/2021   Sinus congestion 11/28/2021   Mild intermittent asthma without complication 10/11/2021   Essential hypertension 10/10/2021   Acquired hypothyroidism 01/09/2021   Acute non-recurrent pansinusitis 07/19/2020   Hidradenitis suppurativa 04/29/2018   Allergic rhinitis 12/23/2017   Varicose veins of both lower extremities with pain 10/16/2017   IBS (irritable bowel syndrome) 06/18/2017   Chronic venous insufficiency 12/31/2016   Lymphedema 12/31/2016   Pain in joint, ankle and foot 12/31/2016   Diabetes (HCC) 12/31/2016   GERD (gastroesophageal reflux disease) 12/31/2016   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   H/O retinal detachment 10/01/2016   No diabetic retinopathy in both eyes  10/01/2016   Cellulitis of right lower extremity 06/19/2016   Chronic joint pain 11/16/2015   Skin infection 05/15/2015   Depressed    Tick bite 10/20/2014   Primary insomnia 07/08/2014   Candidal balanitis 02/16/2014   Skin tag 01/17/2014   Left shoulder pain 10/04/2013   Anxiety 09/13/2013   Diarrhea 09/13/2013   Bleeding external hemorrhoids 09/03/2013   NAFLD (nonalcoholic fatty liver disease) 96/29/5284   Headache 07/05/2013   Abdominal pain 03/05/2013   Morbid (severe) obesity due to excess calories (HCC) 03/05/2013   Suicidal ideation 02/09/2013   Obstructive sleep apnea (adult) (pediatric) 07/21/2012   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    PCP: Laurence Pons, NP  REFERRING PROVIDER: Jeni Mitten, DPM  REFERRING DIAG: M72.2 (ICD-10-CM) - Plantar fasciitis of left foot   THERAPY DIAG:  Gait difficulty  Pain in left foot  Ankle joint stiffness, left  Muscle weakness (generalized)  Rationale for Evaluation and Treatment: Rehabilitation  ONSET DATE: Chronic  SUBJECTIVE:   SUBJECTIVE STATEMENT: Pt. Reports chronic h/o L foot/ ankle pain.  Pt. Had a fall over a year ago with L ankle issues.  Pt. Has over the counter orthotics and wearing New Balance shoes.  Pt. Received 2 injections to foot in past and occasional using ankle brace/ strap to mange pain symptoms.   PERTINENT HISTORY:   Subjective:  Patient ID: Jason Davidson, male    DOB:  1980/06/27,  MRN: [id number]       Chief Complaint  Patient presents with   Plantar Fasciitis      "It's hurting."      43 y.o. male presents with the above complaint. History confirmed with patient.  He has been dealing with heel pain for some time and the injection he had at his last visit with Dr. Lara Plants was helpful, has worn off the meloxicam  did not help much, brace was helping as well as it broke recently and the strap was not working more   Objective:  Physical Exam: warm, good capillary refill, no trophic  changes or ulcerative lesions, normal DP and PT pulses, normal sensory exam, and pain on palpation of plantar medial heel at insertion of plantar fascia.     Radiographs: Previous x-rays taken December 24 showed no stress fracture or heel spur Assessment:    1. Plantar fasciitis of left foot         Plan:  Patient was evaluated and treated and all questions answered.   Discussed the etiology and treatment options for plantar fasciitis including stretching, formal physical therapy, supportive shoegears such as a running shoe or sneaker, pre fabricated orthoses, injection therapy, and oral medications. We also discussed the role of surgical treatment of this for patients who do not improve after exhausting non-surgical treatment options.     -Educated patient on stretching and icing of the affected limb -Plantar fascial brace dispensed again his previous brace failed.  Replacement was given today -Injection delivered to the plantar fascia of the left foot. -Rx for diclofenac . Educated on use, risks and benefits of the medication   After sterile prep with povidone-iodine solution and alcohol, the left heel was injected with 0.5cc 2% xylocaine  plain, 0.5cc 0.5% marcaine plain, 10mg  triamcinolone  acetonide, and 4mg  dexamethasone  was injected along the medial plantar fascia at the insertion on the plantar calcaneus. The patient tolerated the procedure well without complication.   Return if symptoms worsen or fail to improve.      PAIN:  Are you having pain? Yes: NPRS scale: 5/10 Pain location: L heel Pain description: sharp/ persistent Aggravating factors: walking Relieving factors: rest/ injections  PRECAUTIONS: Fall  RED FLAGS: None   WEIGHT BEARING RESTRICTIONS: No  FALLS:  Has patient fallen in last 6 months? 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  DIAGNOSTIC FINDINGS: CLINICAL DATA:  Fall 3 days ago with left ankle pain.   EXAM: LEFT ANKLE COMPLETE - 3+ VIEW   COMPARISON:  None Available.   FINDINGS: There is no evidence of fracture, dislocation, or joint effusion. There is no evidence of arthropathy or other focal bone abnormality. Soft tissues are unremarkable.  PATIENT SURVEYS:  LEFS 29 out of 80  COGNITION: Overall cognitive status: Within functional limits for tasks assessed     SENSATION: WFL  EDEMA:  L lower leg lymphadema  MUSCLE LENGTH: Hamstrings: limited B proximal/distal hamstring flexibility.  Thomas test: NT  POSTURE: rounded shoulders and forward head  PALPATION: (+) tenderness with palpation over L plantar aspect of foot (medial).  Good L great toe extension.    LOWER EXTREMITY ROM:  Active ROM Right eval Left eval  Hip flexion    Hip extension    Hip abduction    Hip adduction    Hip internal rotation    Hip external rotation    Knee flexion [facility]  Knee extension [facility]  Ankle dorsiflexion 11 deg. 12 deg.  Ankle plantarflexion 50 deg. 58 deg.  Ankle inversion 30 deg. 30 deg.  Ankle eversion 22 deg. 20 deg.    (Blank rows = not tested)  LOWER EXTREMITY MMT:  MMT Right eval Left eval  Hip flexion    Hip extension    Hip abduction    Hip adduction    Hip internal rotation    Hip external rotation    Knee flexion 5 5  Knee extension 5 5  Ankle dorsiflexion 5 5  Ankle plantarflexion 4 4  Ankle inversion 4+ 4+  Ankle eversion 4+ 4+   (Blank rows = not tested)  LOWER EXTREMITY SPECIAL TESTS:  NT  FUNCTIONAL TESTS:  NT  GAIT: Distance walked: in clinic Assistive device utilized: None Level of assistance: Complete Independence Comments: L antalgic  gait pattern.  Discussed use of SPC to decrease L foot/heel wt. bearing                                                                                                                              TREATMENT DATE: 10/30/23   Subjective:  Pt entered PT with use of SPC and c/o 4/10 L heel pain.  Pt. Scheduled to f/u with Podiatrist next week. Pt. States L heel pain has been 6/10 with increase walking at home today.  Pt. Continues to use inserts into his new shoes and reports a little help.  Pt. Rescheduled PT appt. Yesterday secondary to [employer].    There.ex.:  Seated heel and toe raises 20x.    Seated toe crunches with towel.    Seated calf stretch/ neural glides 3 x 30 seconds   Seated L ankle circles/ IV/ EV/ PF/ DF.    Supine L ankle strengthening ex.:  GTB with IV/EV/ PF/ DF 10x each with PT assist with band.    Discussed HEP  Manual:  Deep STM to L plantar fascia x 16 minutes - tenderness noted at L heel.  Attempted mobs to calcaneous but pain limited.    Supine L gastroc/ IV/ EV/ great toe extension stretches with static holds during/ after STM.     PATIENT EDUCATION:  Education details: HEP/ shoe wear/ Ice massage Person educated: Patient Education method: Explanation, Demonstration, and Handouts Education comprehension: verbalized understanding and returned demonstration  HOME EXERCISE PROGRAM: Access Code: [email or web address] URL: [email or web address] Date: 09/17/2023 Prepared by: Hazeline Lister  Exercises - Seated Gastroc Stretch with Strap  - 1 x daily - 7 x weekly - 1 sets - 5 reps - Seated Self Great Toe Stretch  - 1 x daily - 7 x weekly - 1 sets - 5 reps - Foot Roller Plantar Massage  -  1 x daily - 7 x weekly - 1 sets - 5 reps  ASSESSMENT:  CLINICAL IMPRESSION:     Session focused on manual therapy and STM to L plantar fascia as well as seated foot/ankle stretches. Tolerated session well with marked tenderness in L heel, not arch.  Pt. Unable to tolerate  mobs secondary to pain at heel.  Pt. Returns to MD next week to discuss lack of progress.  Pt. Will benefit from short-term skilled PT services to develop stretching/ strengthening ex. Program and education to improve pain-free mobility.     OBJECTIVE IMPAIRMENTS: Abnormal gait, decreased activity tolerance, decreased balance, decreased mobility, difficulty walking, decreased ROM, decreased strength, impaired flexibility, improper body mechanics, obesity, and pain.   ACTIVITY LIMITATIONS: squatting and locomotion level  PARTICIPATION LIMITATIONS: community activity, occupation, and yard work  PERSONAL FACTORS: Fitness and Past/current experiences are also affecting patient's functional outcome.   REHAB POTENTIAL: Good  CLINICAL DECISION MAKING: Evolving/moderate complexity  EVALUATION COMPLEXITY: Moderate   GOALS: Goals reviewed with patient? Yes  LONG TERM GOALS: Target date: 11/17/23  Pt. Independent with HEP to increase L ankle stability to WNL as compared to R to improve pain-free mobility.   Baseline:  see above Goal status: Not met  2.  Pt. Will increase LEFS to >40 out of 80 to improve pain-free mobility. Baseline: 29 out of 80 Goal status: Ongoing  3.  Pt. Will reports <3/10 L foot/ankle pain while completing job/ walking about mobile home park.   Baseline: >5/10 L foot pain currently Goal status: Not met  4.  Pt. Able to complete camping trip with [employer] with no L foot pain/ limitations.   Baseline:  increase L foot pain/limitations.  Goal status: Not met  PLAN:  PT FREQUENCY: 1-2x/week  PT DURATION: 4 weeks  PLANNED INTERVENTIONS: 97110-Therapeutic exercises, 97530- Therapeutic activity, [id number]- Neuromuscular re-education, 97535- Self Care, 16109- Manual therapy, [phone]- Gait training, [phone]- Ionotophoresis 4mg /ml Dexamethasone , Patient/Family education, Balance training, Stair training, Taping, Dry Needling, Joint mobilization, Cryotherapy, and Moist  heat  PLAN FOR NEXT SESSION: Discuss MD f/u.    Lendell Quarry, PT, DPT # [phone] Physical Therapist - [facility]  10/30/2023, 6:31 PM

## 2023-10-30 NOTE — Unmapped (Signed)
 Dermatology Note     Assessment and Plan:      Hidradenitis suppurativa, Hurley stage II, chronic,flaring  Previously tried: doxycyline, humira 80 mg weekly, prednisone, intra lesional kenalog   - Diagnosis, treatment options, prognosis, risk/ benefit, and side effects of treatment were discussed with the patient.   - s/p unroofing of right scrotum 2020  - Continue secukinumab 300 mg/2 mL PnIj; Inject the contents of 1 pen (300 mg) under the skin once weekly for 5 weeks (on weeks 0,1,2,3,4) for loading dose then every 28 days   - Continue clindamycin 1% lotion topically nightly to the flaring lesions  - Continue triamcinolone 0.1% ointment; Apply to affected area twice daily until improved or for up to 2 weeks, whichever is sooner. Break for 1-2 weeks. Restart as needed.   - intra lesional kenalog as below     Intralesional Kenalog Procedure Note: After the patient was informed of risks (including atrophy and dyspigmentation), benefits and side effects of intralesional steroid injection, the patient elected to undergo injection and verbal consent was obtained. Skin was cleaned with alcohol and injected intralesionally into the sites (below). The patient tolerated the procedure well without complications and was instructed on post-procedure care.  Location(s): right mons pubis  Number of sites treated: 1  Kenalog (triamcinolone) Concentration: 40 mg/ml   Volume: 1 ml total       High Risk Medication Use (Cosentyx)  - No history of IBD  - Quant gold neg 07/2023  - Hep serologies acceptable from 2019    The patient was advised to call for an appointment should any new, changing, or symptomatic lesions develop.     RTC: Return for Next scheduled follow up. or sooner as needed   _________________________________________________________________      Chief Complaint     Chief Complaint   Patient presents with    Follow-up     HS - pt states that it is started flaring up from last 2 weeks ,using clobetasol.        HPI Logan Quinn. is a 43 y.o. male who presents as a returning patient (last seen 09/18/2023) to Dermatology for hidradenitis suppurativa flare.     Patient reports flare on right mons pubis for the past several days. This has not been improving with oral clindamycin, topical clindamycin or triamcinolone.     The patient denies any other new or changing lesions or areas of concern.     Pertinent Past Medical History     Problem List    None        Past Medical History, Family History, Social History, Medication List, Allergies, and Problem List were reviewed in the rooming section of Epic.     ROS: Other than symptoms mentioned in the HPI, no fevers, chills, or other skin complaints    Physical Examination     GENERAL: Well-appearing male in no acute distress, resting comfortably.  NEURO: Alert and oriented, answers questions appropriately  PSYCH: Normal mood and affect  RESP: No increased work of breathing  SKIN (Focal Skin Exam): Per patient request, examination of groin was performed  - inflammatory nodules on mons pubis  - 1 draining sinus of the mons pubis    All areas not commented on are within normal limits or unremarkable      (Approved Template 02/21/2020)

## 2023-10-31 ENCOUNTER — Ambulatory Visit
Admit: 2023-10-31 | Discharge: 2023-11-01 | Payer: Medicare (Managed Care) | Attending: Student in an Organized Health Care Education/Training Program | Primary: Student in an Organized Health Care Education/Training Program

## 2023-10-31 DIAGNOSIS — Z79899 Other long term (current) drug therapy: Principal | ICD-10-CM

## 2023-10-31 DIAGNOSIS — L732 Hidradenitis suppurativa: Principal | ICD-10-CM

## 2023-11-05 ENCOUNTER — Encounter: Payer: Self-pay | Admitting: Podiatry

## 2023-11-05 ENCOUNTER — Ambulatory Visit (INDEPENDENT_AMBULATORY_CARE_PROVIDER_SITE_OTHER): Admitting: Podiatry

## 2023-11-05 ENCOUNTER — Ambulatory Visit: Admitting: Physical Therapy

## 2023-11-05 DIAGNOSIS — M722 Plantar fascial fibromatosis: Secondary | ICD-10-CM

## 2023-11-05 DIAGNOSIS — R269 Unspecified abnormalities of gait and mobility: Secondary | ICD-10-CM

## 2023-11-05 DIAGNOSIS — M25672 Stiffness of left ankle, not elsewhere classified: Secondary | ICD-10-CM

## 2023-11-05 DIAGNOSIS — M79672 Pain in left foot: Secondary | ICD-10-CM

## 2023-11-05 DIAGNOSIS — M6281 Muscle weakness (generalized): Secondary | ICD-10-CM

## 2023-11-05 NOTE — Therapy (Signed)
 OUTPATIENT PHYSICAL THERAPY LOWER EXTREMITY TREATMENT  Patient Name: Jason Davidson MRN: [id number] DOB:Jun 06, 1981, 43 y.o., male Today's Date: 11/05/2023  END OF SESSION:  PT End of Session - 11/05/23 1504     Visit Number 9    Number of Visits 14    Date for PT Re-Evaluation 11/17/23    PT Start Time 1356    PT Stop Time 1415    PT Time Calculation (min) 19 min    Behavior During Therapy WFL for tasks assessed/performed            Past Medical History:  Diagnosis Date   Asthma    Depressed    Diabetes mellitus without complication (HCC)    Gout    IBS (irritable bowel syndrome)    Morbid obesity (HCC)    Sleep apnea    Thyroid disease    Past Surgical History:  Procedure Laterality Date   COLONOSCOPY     ESOPHAGOGASTRODUODENOSCOPY ENDOSCOPY     EYE SURGERY     LASER ABLATION INCOMPETENT VEIN INI     Patient Active Problem List   Diagnosis Date Noted   Sprain of left ankle 07/03/2022   Tinea pedis 03/18/2022   Chronic pansinusitis 11/28/2021   Cough variant asthma 11/28/2021   Environmental and seasonal allergies 11/28/2021   Erectile dysfunction 11/28/2021   Nasal congestion 11/28/2021   Sinus congestion 11/28/2021   Mild intermittent asthma without complication 10/11/2021   Essential hypertension 10/10/2021   Acquired hypothyroidism 01/09/2021   Acute non-recurrent pansinusitis 07/19/2020   Hidradenitis suppurativa 04/29/2018   Allergic rhinitis 12/23/2017   Varicose veins of both lower extremities with pain 10/16/2017   IBS (irritable bowel syndrome) 06/18/2017   Chronic venous insufficiency 12/31/2016   Lymphedema 12/31/2016   Pain in joint, ankle and foot 12/31/2016   Diabetes (HCC) 12/31/2016   GERD (gastroesophageal reflux disease) 12/31/2016   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   H/O retinal detachment 10/01/2016   No diabetic retinopathy in both eyes 10/01/2016   Cellulitis of right lower extremity  06/19/2016   Chronic joint pain 11/16/2015   Skin infection 05/15/2015   Depressed    Tick bite 10/20/2014   Primary insomnia 07/08/2014   Candidal balanitis 02/16/2014   Skin tag 01/17/2014   Left shoulder pain 10/04/2013   Anxiety 09/13/2013   Diarrhea 09/13/2013   Bleeding external hemorrhoids 09/03/2013   NAFLD (nonalcoholic fatty liver disease) 13/01/6577   Headache 07/05/2013   Abdominal pain 03/05/2013   Morbid (severe) obesity due to excess calories (HCC) 03/05/2013   Suicidal ideation 02/09/2013   Obstructive sleep apnea (adult) (pediatric) 07/21/2012   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    PCP: Laurence Pons, NP  REFERRING PROVIDER: Jeni Mitten, DPM  REFERRING DIAG: M72.2 (ICD-10-CM) - Plantar fasciitis of left foot   THERAPY DIAG:  Gait difficulty  Pain in left foot  Ankle joint stiffness, left  Muscle weakness (generalized)  Rationale for Evaluation and Treatment: Rehabilitation  ONSET DATE: Chronic  SUBJECTIVE:   SUBJECTIVE STATEMENT: Pt. Reports chronic h/o L foot/ ankle pain.  Pt. Had a fall over a year ago with L ankle issues.  Pt. Has over the counter orthotics and wearing [employer] shoes.  Pt. Received 2 injections to foot in past and occasional using ankle brace/ strap to mange pain symptoms.   PERTINENT HISTORY:   Subjective:  Patient ID: Jason Davidson, male    DOB: [birth year],  MRN: [id number]  Chief Complaint  Patient presents with   Plantar Fasciitis      "It's hurting."      43 y.o. male presents with the above complaint. History confirmed with patient.  He has been dealing with heel pain for some time and the injection he had at his last visit with Dr. Lara Plants was helpful, has worn off the meloxicam  did not help much, brace was helping as well as it broke recently and the strap was not working more   Objective:  Physical Exam: warm, good capillary refill, no trophic changes or ulcerative lesions, normal DP and PT  pulses, normal sensory exam, and pain on palpation of plantar medial heel at insertion of plantar fascia.     Radiographs: Previous x-rays taken December 24 showed no stress fracture or heel spur Assessment:    1. Plantar fasciitis of left foot         Plan:  Patient was evaluated and treated and all questions answered.   Discussed the etiology and treatment options for plantar fasciitis including stretching, formal physical therapy, supportive shoegears such as a running shoe or sneaker, pre fabricated orthoses, injection therapy, and oral medications. We also discussed the role of surgical treatment of this for patients who do not improve after exhausting non-surgical treatment options.     -Educated patient on stretching and icing of the affected limb -Plantar fascial brace dispensed again his previous brace failed.  Replacement was given today -Injection delivered to the plantar fascia of the left foot. -Rx for diclofenac . Educated on use, risks and benefits of the medication   After sterile prep with povidone-iodine solution and alcohol, the left heel was injected with 0.5cc 2% xylocaine  plain, 0.5cc 0.5% marcaine plain, 10mg  triamcinolone  acetonide, and 4mg  dexamethasone  was injected along the medial plantar fascia at the insertion on the plantar calcaneus. The patient tolerated the procedure well without complication.   Return if symptoms worsen or fail to improve.      PAIN:  Are you having pain? Yes: NPRS scale: 5/10 Pain location: L heel Pain description: sharp/ persistent Aggravating factors: walking Relieving factors: rest/ injections  PRECAUTIONS: Fall  RED FLAGS: None   WEIGHT BEARING RESTRICTIONS: No  FALLS:  Has patient fallen in last 6 months? 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  DIAGNOSTIC FINDINGS: CLINICAL DATA:  Fall 3 days ago with left ankle pain.   EXAM: LEFT ANKLE COMPLETE - 3+ VIEW   COMPARISON:  None Available.   FINDINGS: There is no evidence of fracture, dislocation, or joint effusion. There is no evidence of arthropathy or other focal bone abnormality. Soft tissues are unremarkable.  PATIENT SURVEYS:  LEFS 29 out of 80  COGNITION: Overall cognitive status: Within functional limits for tasks assessed     SENSATION: WFL  EDEMA:  L lower leg lymphadema  MUSCLE LENGTH: Hamstrings: limited B proximal/distal hamstring flexibility.  Thomas test: NT  POSTURE: rounded shoulders and forward head  PALPATION: (+) tenderness with palpation over L plantar aspect of foot (medial).  Good L great toe extension.    LOWER EXTREMITY ROM:  Active ROM Right eval Left eval  Hip flexion    Hip extension  Hip abduction    Hip adduction    Hip internal rotation    Hip external rotation    Knee flexion [facility]  Knee extension [facility]  Ankle dorsiflexion 11 deg. 12 deg.  Ankle plantarflexion 50 deg. 58 deg.  Ankle inversion 30 deg. 30 deg.  Ankle eversion 22 deg. 20 deg.    (Blank rows = not tested)  LOWER EXTREMITY MMT:  MMT Right eval Left eval  Hip flexion    Hip extension    Hip abduction    Hip adduction    Hip internal rotation    Hip external rotation    Knee flexion 5 5  Knee extension 5 5  Ankle dorsiflexion 5 5  Ankle plantarflexion 4 4  Ankle inversion 4+ 4+  Ankle eversion 4+ 4+   (Blank rows = not tested)  LOWER EXTREMITY SPECIAL TESTS:  NT  FUNCTIONAL TESTS:  NT  GAIT: Distance walked: in clinic Assistive device utilized: None Level of assistance: Complete Independence Comments: L antalgic gait pattern.  Discussed use of  SPC to decrease L foot/heel wt. bearing                                                                                                                              TREATMENT DATE: 11/05/23   Subjective:  Pt. Had f/u with Podiatrist today and had injection in L heel.  Pt. Has no L heel pain since steroid injection this morning.  MD recommends pt. Continue PT and return for f/u in 1-2 months if L heel pain persists.    NO PT tx. Today secondary to injection this morning in L heel.  PT recommends pt. Take it easy for 24-48 hours and return to HEP.  Pt. Will use ice to L heel to manage inflammation/ tenderness/ pain.  Pt. Going to beach this weekend and will return to PT next Tuesday.     PATIENT EDUCATION:  Education details: HEP/ shoe wear/ Ice massage Person educated: Patient Education method: Explanation, Demonstration, and Handouts Education comprehension: verbalized understanding and returned demonstration  HOME EXERCISE PROGRAM: Access Code: [email or web address] URL: [email or web address] Date: 09/17/2023 Prepared by: Hazeline Lister  Exercises - Seated Gastroc Stretch with Strap  - 1 x daily - 7 x weekly - 1 sets - 5 reps - Seated Self Great Toe Stretch  - 1 x daily - 7 x weekly - 1 sets - 5 reps - Foot Roller Plantar Massage  - 1 x daily - 7 x weekly - 1 sets - 5 reps  ASSESSMENT:  CLINICAL IMPRESSION:     No tx. Today.  No charge.  See above.    OBJECTIVE IMPAIRMENTS: Abnormal gait, decreased activity tolerance, decreased balance, decreased mobility, difficulty walking, decreased ROM, decreased strength, impaired flexibility, improper body mechanics, obesity, and pain.   ACTIVITY LIMITATIONS: squatting and locomotion level  PARTICIPATION LIMITATIONS: community activity, occupation, and yard work  PERSONAL FACTORS: Fitness and Past/current experiences are  also affecting patient's functional outcome.   REHAB POTENTIAL: Good  CLINICAL DECISION MAKING: Evolving/moderate  complexity  EVALUATION COMPLEXITY: Moderate   GOALS: Goals reviewed with patient? Yes  LONG TERM GOALS: Target date: 11/17/23  Pt. Independent with HEP to increase L ankle stability to WNL as compared to R to improve pain-free mobility.   Baseline:  see above Goal status: Not met  2.  Pt. Will increase LEFS to >40 out of 80 to improve pain-free mobility. Baseline: 29 out of 80 Goal status: Ongoing  3.  Pt. Will reports <3/10 L foot/ankle pain while completing job/ walking about mobile home park.   Baseline: >5/10 L foot pain currently Goal status: Not met  4.  Pt. Able to complete camping trip with [employer] with no L foot pain/ limitations.   Baseline:  increase L foot pain/limitations.  Goal status: Not met  PLAN:  PT FREQUENCY: 1-2x/week  PT DURATION: 4 weeks  PLANNED INTERVENTIONS: 97110-Therapeutic exercises, 97530- Therapeutic activity, [id number]- Neuromuscular re-education, 97535- Self Care, 16109- Manual therapy, [phone]- Gait training, [phone]- Ionotophoresis 4mg /ml Dexamethasone , Patient/Family education, Balance training, Stair training, Taping, Dry Needling, Joint mobilization, Cryotherapy, and Moist heat  PLAN FOR NEXT SESSION:  Recheck L heel tenderness/ pain.  Check goals.    Lendell Quarry, PT, DPT # [phone] Physical Therapist - [facility]  11/05/2023, 3:06 PM

## 2023-11-06 NOTE — Progress Notes (Signed)
  Subjective:  Patient ID: Jason Davidson, male    DOB: [birth year],  MRN: [id number]  Chief Complaint  Patient presents with   Plantar Fasciitis    "It's a little better.  Physical Therapy seems to think it's not getting as well as it should be."    43 y.o. male presents with the above complaint. History confirmed with patient.  He returns for follow-up he has been doing therapy and has helped some  Objective:  Physical Exam: warm, good capillary refill, no trophic changes or ulcerative lesions, normal DP and PT pulses, normal sensory exam, and pain on palpation of plantar medial heel at insertion of plantar fascia.   Radiographs: Previous x-rays taken December 24 showed no stress fracture or heel spur Assessment:   1. Plantar fasciitis of left foot      Plan:  Patient was evaluated and treated and all questions answered.  Has had some improvement should continue physical therapy, unfortunately further treatment options including surgical options are not available to him at this point with his uncontrolled diabetes and BMI.  There are other nonsurgical options including ESWT and PRP, we discussed these options today but would not be covered by insurance.  I recommended further injection today.  He should continue physical therapy.  If not improved within 2 months or worsens before then would plan for MRI at that point.  -Injection delivered to the plantar fascia of the left foot. - Continue diclofenac .  After sterile prep with povidone-iodine solution and alcohol, the left heel was injected with 0.5cc 2% xylocaine  plain, 0.5cc 0.5% marcaine plain, 10mg  triamcinolone  acetonide, and 4mg  dexamethasone  was injected along the medial plantar fascia at the insertion on the plantar calcaneus. The patient tolerated the procedure well without complication.  Return in about 2 months (around 01/05/2024) for recheck plantar fasciitis.

## 2023-11-07 NOTE — Unmapped (Signed)
 Patient BP log reviewed. Measurements remain high. Newer log has been sent for review with improved BP. Will continue to monitor.

## 2023-11-10 DIAGNOSIS — L732 Hidradenitis suppurativa: Principal | ICD-10-CM

## 2023-11-10 MED ORDER — COSENTYX UNOREADY PEN 300 MG/2 ML SUBCUTANEOUS PEN INJECTOR
0 refills | 0.00000 days | Status: CN
Start: 2023-11-10 — End: ?

## 2023-11-10 NOTE — Unmapped (Signed)
 [facility] Clinical Assessment & Refill Coordination Note    Logan Mo., DOB: 1980/11/29  Phone: There are no phone numbers on file.    All above HIPAA information was verified with patient.     Was a Nurse, learning disability used for this call? No    Specialty Medication(s):   Inflammatory Disorders: Cosentyx     Current Outpatient Medications   Medication Sig Dispense Refill    allopurinol (ZYLOPRIM) 300 MG tablet Take 1 tablet (300 mg total) by mouth daily. 90 tablet 3    ALPRAZolam (XANAX) 0.5 MG tablet Takes 1 tablet nightly and 1 tablet a day when needed      anastrozole (ARIMIDEX) 1 mg tablet Take 1 tablet (1 mg total) by mouth daily. 30 tablet 11    ascorbic acid (VITAMIN C ORAL) Take 1 capsule by mouth nightly.      atorvastatin (LIPITOR) 20 MG tablet Take 1 tablet (20 mg total) by mouth daily. 90 tablet 3    blood sugar diagnostic (ACCU-CHEK GUIDE TEST STRIPS) Strp by Other route Three (3) times a day before meals. 300 strip 3    blood-glucose meter kit Use as directed 1 each 0    budesonide-formoterol (SYMBICORT) 80-4.5 mcg/actuation inhaler Inhale 2 puffs two (2) times a day. 10.2 g 0    clindamycin (CLEOCIN T) 1 % lotion Apply topically two (2) times a day. To affected areas on body as needed for flares 60 mL 3    clindamycin (CLEOCIN) 150 MG capsule Take 1 capsule (150 mg total) by mouth Three (3) times a day.      clobetasol (TEMOVATE) 0.05 % ointment Apply to HS lesions twice daily for 5-7 days as needed for flares 60 g 1    cyclobenzaprine (FLEXERIL) 10 MG tablet Take 1 tablet (10 mg total) by mouth Three (3) times a day as needed for muscle spasms. 60 tablet 1    dextroamphetamine sulfate (DEXTROSTAT) 10 MG tablet Take 3 tablets (30 mg total) by mouth two (2) times a day.      dicyclomine (BENTYL) 10 mg capsule Take 1 capsule (10 mg total) by mouth Four (4) times a day (before meals and nightly). 120 capsule 1    dulaglutide (TRULICITY) 3 mg/0.5 mL injection pen Inject 0.5 mL (3 mg total) under the skin every seven (7) days. 2 mL 2    DULoxetine (CYMBALTA) 60 MG capsule Take 1 capsule (60 mg total) by mouth Two (2) times a day. 180 capsule 0    empty container Misc Use as directed to dispose of Cosentyx pens. 1 each 2    erenumab-aooe (AIMOVIG AUTOINJECTOR) auto-injector Inject 1 Pen under the skin every thirty (30) days. 3 mL 3    famotidine (PEPCID) 20 MG tablet Take 1 tablet (20 mg total) by mouth two (2) times a day. 180 tablet 3    fluconazole (DIFLUCAN) 150 MG tablet Take 150 mg now and repeat 150 mg in 5-7 days. 2 tablet 1    fluocinonide (LIDEX) 0.05 % gel Apply 1 Application topically Three (3) times a day.      fluoride, sodium, 1.1 % Pste       fluticasone propionate (FLONASE) 50 mcg/actuation nasal spray 1 spray into each nostril two (2) times a day. 16 g 11    glipiZIDE (GLUCOTROL) 10 MG tablet Take 2 tablets (20 mg total) by mouth Two (2) times a day (30 minutes before a meal). 360 tablet 3  HUMALOG U-100 INSULIN 100 unit/mL injection INJECT 120 UNITS UNDER THE SKIN DAILY VIA OMNIPOD 100 mL 3    hydrOXYzine (ATARAX) 25 MG tablet Take 1 tablet (25 mg total) by mouth Three (3) times a day as needed. May take two before bed for sleep. 120 tablet 1    ibuprofen (ADVIL,MOTRIN) 800 MG tablet Take 1 tablet (800 mg total) by mouth every eight (8) hours as needed. 90 tablet 1    insulin glargine (LANTUS U-100 INSULIN) 100 unit/mL injection Inject 0.2 mL (20 Units total) under the skin nightly. 18 mL 3    insulin syringe-needle U-100 (BD INSULIN SYRINGE ULTRA-FINE) 1 mL 31 gauge x 5/16 (8 mm) Syrg Once daily for insulin dosing. 100 each 3    insulin syringe-needle U-100 1 mL 31 gauge x 5/16 (8 mm) Syrg Once daily for insulin dosing. 100 each 3    lancets (ACCU-CHEK FASTCLIX LANCET DRUM) Misc Use to check blood sugars 3 times a day before meals or as directed 200 each 5    levothyroxine (SYNTHROID) 50 MCG tablet Take 1 tablet (50 mcg total) by mouth daily. 90 tablet 1 lidocaine (XYLOCAINE) 5 % ointment Apply topically Three (3) times a day as needed. 30 g 1    losartan (COZAAR) 50 MG tablet Take 1 tablet (50 mg total) by mouth daily. 90 tablet 3    montelukast (SINGULAIR) 10 mg tablet Take 1 tablet (10 mg total) by mouth daily. 90 tablet 3    multivitamin (MULTIPLE VITAMIN ORAL) Take 1 tablet by mouth daily.      OMNIPOD 5 PACK PODS Omnipod 5 G6/G7 system POD (Gen 5) (5 PODS) Change POD every 48 hours 3 each 11    ondansetron (ZOFRAN) 4 MG tablet Take 1 tablet (4 mg total) by mouth every eight (8) hours as needed for nausea. 30 tablet 1    oxybutynin (DITROPAN) 5 MG tablet Take 1 tablet (5 mg total) by mouth two (2) times a day. 180 tablet 1    propranoloL (INDERAL LA) 120 mg 24 hr capsule Take 1 capsule (120 mg total) by mouth daily. 90 capsule 1    ramelteon (ROZEREM) 8 mg tablet Take 1 tablet (8 mg total) by mouth nightly.      safety needles (BD SAFETYGLIDE NEEDLE) 18 gauge x 1 1/2 Ndle Inject 1 each into the muscle once a week. 50 each 0    secukinumab (COSENTYX UNOREADY PEN) 300 mg/2 mL PnIj Inject the contents of 1 pen (300 mg) under the skin every 28 days (Patient not taking: Reported on 10/08/2023) 2 mL 11    secukinumab (COSENTYX UNOREADY PEN) 300 mg/2 mL PnIj Inject the contents of 1 pen (300 mg) under the skin once weekly for 5 weeks (on weeks 0,1,2,3,4) for loading dose. 10 mL 0    sour cherry extract (TART CHERRY EXTRACT) 1,000 mg cap       syringe with needle (BD LUER-LOK SYRINGE) 3 mL 21 gauge x 1 1/2 Syrg To draw up testosterone once weekly 100 each 0    tadalafil (CIALIS) 5 MG tablet Take 1 tablet (5 mg total) by mouth in the morning. 90 tablet 3    testosterone cypionate (DEPOTESTOTERONE CYPIONATE) 200 mg/mL injection Inject 0.4 mL (80 mg total) into the muscle once a week. Inject 0.4cc (80mg ) weekly. Discard after each use 4 mL 5    topiramate (TOPAMAX) 50 MG tablet Take 1.5 tablets (75 mg total) by mouth two (2) times a day. 270 tablet  3    traMADol (ULTRAM) 50 mg tablet Take 1 tablet (50 mg total) by mouth every six (6) hours. As needed for pain 20 tablet 0    WELLBUTRIN XL 150 mg 24 hr tablet Take 1 tablet (150 mg total) by mouth every morning.       No current facility-administered medications for this visit.        Changes to medications: Lamar reports no changes at this time.    Medication list has been reviewed and updated in Epic: Yes    Allergies   Allergen Reactions    Erythromycin Nausea And Vomiting     Other reaction(s): Vomiting    Penicillins Rash, Swelling and Hives    Sulfa (Sulfonamide Antibiotics) Nausea And Vomiting and Nausea Only    Other     Metronidazole Nausea And Vomiting       Changes to allergies: No    Allergies have been reviewed and updated in Epic: Yes    SPECIALTY MEDICATION ADHERENCE     Cosentyx - 0 left  Medication Adherence    Patient reported X missed doses in the last month: 0  Specialty Medication: Cosentyx          Specialty medication(s) dose(s) confirmed: Regimen is correct and unchanged.     Are there any concerns with adherence? No    Adherence counseling provided? Not needed    CLINICAL MANAGEMENT AND INTERVENTION      Clinical Benefit Assessment:    Do you feel the medicine is effective or helping your condition? Yes    Clinical Benefit counseling provided? Not needed    Adverse Effects Assessment:    Are you experiencing any side effects? No    Are you experiencing difficulty administering your medicine? No    Quality of Life Assessment:    Quality of Life    Rheumatology  Oncology  Dermatology  1. What impact has your specialty medication had on the symptoms of your skin condition (i.e. itchiness, soreness, stinging)?: Minimal  2. What impact has your specialty medication had on your comfort level with your skin?: Minimal  Cystic Fibrosis          How many days over the past month did your HS  keep you from your normal activities? For example, brushing your teeth or getting up in the morning. Patient declined to answer    Have you discussed this with your provider? Not needed    Acute Infection Status:    Acute infections noted within Epic:  No active infections    Patient reported infection: None    Therapy Appropriateness:    Is therapy appropriate based on current medication list, adverse reactions, adherence, clinical benefit and progress toward achieving therapeutic goals? Yes, therapy is appropriate and should be continued     Clinical Intervention:    Was an intervention completed as part of this clinical assessment? No    DISEASE/MEDICATION-SPECIFIC INFORMATION      For patients on injectable medications: Patient currently has 0 doses left.  Next injection is scheduled for 6/12.    Chronic Inflammatory Diseases: Have you experienced any flares in the last month? Yes, But resolve faster than before  Has this been reported to your provider? Yes, had follow up with provider recently    PATIENT SPECIFIC NEEDS     Does the patient have any physical, cognitive, or cultural barriers? No    Is the patient high risk? No    Does the patient require  physician intervention or other additional services (i.e., nutrition, smoking cessation, social work)? No    Does the patient have an additional or emergency contact listed in their chart? Yes    SOCIAL DETERMINANTS OF HEALTH     At the [facility], we have learned that life circumstances - like trouble affording food, housing, utilities, or transportation can affect the health of many of our patients.   That is why we wanted to ask: are you currently experiencing any life circumstances that are negatively impacting your health and/or quality of life? Patient declined to answer    Social Drivers of Health     Food Insecurity: No Food Insecurity (09/17/2023)    Hunger Vital Sign     Worried About Running Out of Food in the Last Year: Never true     Ran Out of Food in the Last Year: Never true   Tobacco Use: Low Risk  (11/05/2023)    Received from [facility]    Patient History Smoking Tobacco Use: Never     Smokeless Tobacco Use: Never     Passive Exposure: Not on file   Transportation Needs: No Transportation Needs (09/17/2023)    PRAPARE - Transportation     Lack of Transportation (Medical): No     Lack of Transportation (Non-Medical): No   Alcohol Use: Not At Risk (09/17/2023)    Alcohol Use     How often do you have a drink containing alcohol?: Never     How many drinks containing alcohol do you have on a typical day when you are drinking?: Not on file     How often do you have 5 or more drinks on one occasion?: Never   Housing: Low Risk  (09/17/2023)    Housing     Within the past 12 months, have you ever stayed: outside, in a car, in a tent, in an overnight shelter, or temporarily in someone else's home (i.e. couch-surfing)?: No     Are you worried about losing your housing?: No   Physical Activity: Not on file   Utilities: Low Risk  (09/17/2023)    Utilities     Within the past 12 months, have you been unable to get utilities (heat, electricity) when it was really needed?: No   Stress: Not on file   Interpersonal Safety: Not At Risk (09/17/2023)    Interpersonal Safety     Unsafe Where You Currently Live: No     Physically Hurt by Anyone: No     Abused by Anyone: No   Substance Use: Low Risk  (09/17/2023)    Substance Use     In the past year, how often have you used prescription drugs for non-medical reasons?: Never     In the past year, how often have you used illegal drugs?: Never     In the past year, have you used any substance for non-medical reasons?: No   Intimate Partner Violence: Unknown (09/13/2021)    Received from [facility]    HITS     Physically Hurt: Not on file     Insult or Talk Down To: Not on file     Threaten Physical Harm: Not on file     Scream or Curse: Not on file   Social Connections: Unknown (10/09/2021)    Received from [facility]    Social Network     Social Network: Not on file   Financial Resource Strain: Low Risk  (  07/02/2022) Overall Financial Resource Strain (CARDIA)     Difficulty of Paying Living Expenses: Not hard at all   Health Literacy: Not on file   Internet Connectivity: Not on file       Would you be willing to receive help with any of the needs that you have identified today? Not applicable       SHIPPING     Specialty Medication(s) to be Shipped:   Inflammatory Disorders: Cosentyx    Other medication(s) to be shipped: No additional medications requested for fill at this time     Changes to insurance: No    Cost and Payment: Patient has a $0 copay, payment information is not required.    Delivery Scheduled: Yes, Expected medication delivery date: 6/6- need maintenance dose rx.     Medication will be delivered via Same Day Courier to the confirmed prescription address in [facility].    The patient will receive a drug information handout for each medication shipped and additional FDA Medication Guides as required.  Verified that patient has previously received a Conservation officer, historic buildings and a Surveyor, mining.    The patient or caregiver noted above participated in the development of this care plan and knows that they can request review of or adjustments to the care plan at any time.      All of the patient's questions and concerns have been addressed.    Kyaira Trantham [facility] Specialty Pharmacist

## 2023-11-11 ENCOUNTER — Ambulatory Visit: Admitting: Physical Therapy

## 2023-11-14 MED FILL — COSENTYX UNOREADY PEN 300 MG/2 ML SUBCUTANEOUS PEN INJECTOR: SUBCUTANEOUS | 28 days supply | Qty: 2 | Fill #0

## 2023-11-14 NOTE — Unmapped (Signed)
 Copied from CRM #[id number]. Topic: Scheduling - New Appointment  >> Nov 14, 2023  8:30 AM Logan Quinn wrote:  Caller asked to schedule a new appointment    Pain in elbow that he can't get rid of. Offered 6/6 appt w/ other provider. Only wants to see PCP.    Additional Requests/Needs: No

## 2023-11-17 ENCOUNTER — Encounter: Payer: Self-pay | Admitting: Physical Therapy

## 2023-11-17 ENCOUNTER — Ambulatory Visit: Attending: Podiatry | Admitting: Physical Therapy

## 2023-11-17 ENCOUNTER — Ambulatory Visit: Admitting: Podiatry

## 2023-11-17 DIAGNOSIS — M6281 Muscle weakness (generalized): Secondary | ICD-10-CM | POA: Diagnosis present

## 2023-11-17 DIAGNOSIS — M25672 Stiffness of left ankle, not elsewhere classified: Secondary | ICD-10-CM | POA: Insufficient documentation

## 2023-11-17 DIAGNOSIS — M79672 Pain in left foot: Secondary | ICD-10-CM | POA: Insufficient documentation

## 2023-11-17 DIAGNOSIS — R269 Unspecified abnormalities of gait and mobility: Secondary | ICD-10-CM | POA: Diagnosis present

## 2023-11-17 NOTE — Therapy (Signed)
 OUTPATIENT PHYSICAL THERAPY LOWER EXTREMITY TREATMENT Physical Therapy Progress Note  Dates of reporting period  09/17/23   to   11/17/23   Patient Name: Jason Davidson MRN: [id number] DOB:Sep 22, 1980, 43 y.o., male Today's Date: 11/17/2023  END OF SESSION:  PT End of Session - 11/17/23 1302     Visit Number 10    Number of Visits 14    Date for PT Re-Evaluation 11/17/23    PT Start Time 1257    PT Stop Time 1344    PT Time Calculation (min) 47 min    Behavior During Therapy WFL for tasks assessed/performed            Past Medical History:  Diagnosis Date   Asthma    Depressed    Diabetes mellitus without complication (HCC)    Gout    IBS (irritable bowel syndrome)    Morbid obesity (HCC)    Sleep apnea    Thyroid disease    Past Surgical History:  Procedure Laterality Date   COLONOSCOPY     ESOPHAGOGASTRODUODENOSCOPY ENDOSCOPY     EYE SURGERY     LASER ABLATION INCOMPETENT VEIN INI     Patient Active Problem List   Diagnosis Date Noted   Sprain of left ankle 07/03/2022   Tinea pedis 03/18/2022   Chronic pansinusitis 11/28/2021   Cough variant asthma 11/28/2021   Environmental and seasonal allergies 11/28/2021   Erectile dysfunction 11/28/2021   Nasal congestion 11/28/2021   Sinus congestion 11/28/2021   Mild intermittent asthma without complication 10/11/2021   Essential hypertension 10/10/2021   Acquired hypothyroidism 01/09/2021   Acute non-recurrent pansinusitis 07/19/2020   Hidradenitis suppurativa 04/29/2018   Allergic rhinitis 12/23/2017   Varicose veins of both lower extremities with pain 10/16/2017   IBS (irritable bowel syndrome) 06/18/2017   Chronic venous insufficiency 12/31/2016   Lymphedema 12/31/2016   Pain in joint, ankle and foot 12/31/2016   Diabetes (HCC) 12/31/2016   GERD (gastroesophageal reflux disease) 12/31/2016   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   H/O retinal detachment 10/01/2016   No diabetic  retinopathy in both eyes 10/01/2016   Cellulitis of right lower extremity 06/19/2016   Chronic joint pain 11/16/2015   Skin infection 05/15/2015   Depressed    Tick bite 10/20/2014   Primary insomnia 07/08/2014   Candidal balanitis 02/16/2014   Skin tag 01/17/2014   Left shoulder pain 10/04/2013   Anxiety 09/13/2013   Diarrhea 09/13/2013   Bleeding external hemorrhoids 09/03/2013   NAFLD (nonalcoholic fatty liver disease) 78/29/5621   Headache 07/05/2013   Abdominal pain 03/05/2013   Morbid (severe) obesity due to excess calories (HCC) 03/05/2013   Suicidal ideation 02/09/2013   Obstructive sleep apnea (adult) (pediatric) 07/21/2012   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    PCP: Jason Pons, NP  REFERRING PROVIDER: Jeni Davidson, DPM  REFERRING DIAG: M72.2 (ICD-10-CM) - Plantar fasciitis of left foot   THERAPY DIAG:  Gait difficulty  Pain in left foot  Ankle joint stiffness, left  Muscle weakness (generalized)  Rationale for Evaluation and Treatment: Rehabilitation  ONSET DATE: Chronic  SUBJECTIVE:   SUBJECTIVE STATEMENT: Pt. Reports chronic h/o L foot/ ankle pain.  Pt. Had a fall over a year ago with L ankle issues.  Pt. Has over the counter orthotics and wearing [employer] shoes.  Pt. Received 2 injections to foot in past and occasional using ankle brace/ strap to mange pain symptoms.   PERTINENT HISTORY:   Subjective:  Patient  ID: Jason Davidson, male    DOB: 1981/06/04,  MRN: [id number]       Chief Complaint  Patient presents with   Plantar Fasciitis      "It's hurting."      43 y.o. male presents with the above complaint. History confirmed with patient.  He has been dealing with heel pain for some time and the injection he had at his last visit with Jason Davidson was helpful, has worn off the meloxicam  did not help much, brace was helping as well as it broke recently and the strap was not working more   Objective:  Physical Exam: warm, good  capillary refill, no trophic changes or ulcerative lesions, normal DP and PT pulses, normal sensory exam, and pain on palpation of plantar medial heel at insertion of plantar fascia.     Radiographs: Previous x-rays taken December 24 showed no stress fracture or heel spur Assessment:    1. Plantar fasciitis of left foot         Plan:  Patient was evaluated and treated and all questions answered.   Discussed the etiology and treatment options for plantar fasciitis including stretching, formal physical therapy, supportive shoegears such as a running shoe or sneaker, pre fabricated orthoses, injection therapy, and oral medications. We also discussed the role of surgical treatment of this for patients who do not improve after exhausting non-surgical treatment options.     -Educated patient on stretching and icing of the affected limb -Plantar fascial brace dispensed again his previous brace failed.  Replacement was given today -Injection delivered to the plantar fascia of the left foot. -Rx for diclofenac . Educated on use, risks and benefits of the medication   After sterile prep with povidone-iodine solution and alcohol, the left heel was injected with 0.5cc 2% xylocaine  plain, 0.5cc 0.5% marcaine plain, 10mg  triamcinolone  acetonide, and 4mg  dexamethasone  was injected along the medial plantar fascia at the insertion on the plantar calcaneus. The patient tolerated the procedure well without complication.   Return if symptoms worsen or fail to improve.      PAIN:  Are you having pain? Yes: NPRS scale: 5/10 Pain location: L heel Pain description: sharp/ persistent Aggravating factors: walking Relieving factors: rest/ injections  PRECAUTIONS: Fall  RED FLAGS: None   WEIGHT BEARING RESTRICTIONS: No  FALLS:  Has patient fallen in last 6 months? 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  DIAGNOSTIC FINDINGS: CLINICAL DATA:  Fall 3 days ago with left ankle pain.   EXAM: LEFT ANKLE COMPLETE - 3+ VIEW   COMPARISON:  None Available.   FINDINGS: There is no evidence of fracture, dislocation, or joint effusion. There is no evidence of arthropathy or other focal bone abnormality. Soft tissues are unremarkable.  PATIENT SURVEYS:  LEFS 29 out of 80  COGNITION: Overall cognitive status: Within functional limits for tasks assessed     SENSATION: WFL  EDEMA:  L lower leg lymphadema  MUSCLE LENGTH: Hamstrings: limited B proximal/distal hamstring flexibility.  Thomas test: NT  POSTURE: rounded shoulders and forward head  PALPATION: (+) tenderness with palpation over L plantar aspect of foot (medial).  Good L great toe extension.    LOWER  EXTREMITY ROM:  Active ROM Right eval Left eval  Hip flexion    Hip extension    Hip abduction    Hip adduction    Hip internal rotation    Hip external rotation    Knee flexion [facility]  Knee extension [facility]  Ankle dorsiflexion 11 deg. 12 deg.  Ankle plantarflexion 50 deg. 58 deg.  Ankle inversion 30 deg. 30 deg.  Ankle eversion 22 deg. 20 deg.    (Blank rows = not tested)  LOWER EXTREMITY MMT:  MMT Right eval Left eval  Hip flexion    Hip extension    Hip abduction    Hip adduction    Hip internal rotation    Hip external rotation    Knee flexion 5 5  Knee extension 5 5  Ankle dorsiflexion 5 5  Ankle plantarflexion 4 4  Ankle inversion 4+ 4+  Ankle eversion 4+ 4+   (Blank rows = not tested)  LOWER EXTREMITY SPECIAL TESTS:  NT  FUNCTIONAL TESTS:  NT  GAIT: Distance walked: in clinic Assistive device utilized: None Level of assistance: Complete  Independence Comments: L antalgic gait pattern.  Discussed use of SPC to decrease L foot/heel wt. bearing                                                                                                                              TREATMENT DATE: 11/17/23   Subjective:  Pt entered PT with use of SPC and c/o 0/10 L heel pain currently.  Pt. Had an 8/10 pain in L heel this past Saturday.  Pt. Received an injection from MD a couple weeks ago with short-term benefit.  Pt. States his trip to [place] went well and he did well walking on sand but was limited while walking on [place] in [place].    LEFS:  27 out of 80.   No significant changes.          Right       Left Ankle dorsiflexion 11 deg. 18 deg.  Ankle plantarflexion 50 deg. 58 deg.  Ankle inversion 39 deg. 30 deg.  Ankle eversion 30 deg. 29 deg.     There.ex.:  Seated heel and toe raises 20x.    Seated toe crunches with towel.    Seated L ankle circles/ IV/ EV/ PF/ DF.    Discussed HEP  Manual:  Deep STM to L plantar fascia x 20 minutes - tenderness noted at L heel.  Increase in L gastroc stretching during use of IASTM.    Supine L gastroc/ IV/ EV/ great toe extension stretches with static holds during/ after STM.     PATIENT EDUCATION:  Education details: HEP/ shoe wear/ Ice massage Person educated: Patient Education method: Explanation, Demonstration, and Handouts Education comprehension: verbalized understanding and returned demonstration  HOME EXERCISE PROGRAM: Access Code: [email or web address] URL: [email or web address] Date: 09/17/2023 Prepared by: Hazeline Lister  Exercises - Seated Gastroc Stretch with  Strap  - 1 x daily - 7 x weekly - 1 sets - 5 reps - Seated Self Great Toe Stretch  - 1 x daily - 7 x weekly - 1 sets - 5 reps - Foot Roller Plantar Massage  - 1 x daily - 7 x weekly - 1 sets - 5 reps  ASSESSMENT:  CLINICAL IMPRESSION:     Session focused on manual therapy and STM to L plantar  fascia/ heel as well as seated foot/ankle stretches. Tolerated session well with marked tenderness in L heel, not arch.  Pt. Understands current HEP and benefits of wearing supportive sneakers.  Pt. Will continue with current HEP and PT discussed importance of lowering A1C for tissue healing.  Pt. Instructed to contact PT if any regression in symptoms or questions.       OBJECTIVE IMPAIRMENTS: Abnormal gait, decreased activity tolerance, decreased balance, decreased mobility, difficulty walking, decreased ROM, decreased strength, impaired flexibility, improper body mechanics, obesity, and pain.   ACTIVITY LIMITATIONS: squatting and locomotion level  PARTICIPATION LIMITATIONS: community activity, occupation, and yard work  PERSONAL FACTORS: Fitness and Past/current experiences are also affecting patient's functional outcome.   REHAB POTENTIAL: Good  CLINICAL DECISION MAKING: Evolving/moderate complexity  EVALUATION COMPLEXITY: Moderate   GOALS: Goals reviewed with patient? Yes  LONG TERM GOALS: Target date: 11/17/23  Pt. Independent with HEP to increase L ankle stability to WNL as compared to R to improve pain-free mobility.   Baseline:  see above Goal status: Not met  2.  Pt. Will increase LEFS to >40 out of 80 to improve pain-free mobility. Baseline: 29 out of 80.  6/9: 27 out of 80 Goal status: Not met  3.  Pt. Will reports <3/10 L foot/ankle pain while completing job/ walking about mobile home park.   Baseline: >5/10 L foot pain currently Goal status: Not met  4.  Pt. Able to complete camping trip with [employer] with no L foot pain/ limitations.   Baseline:  increase L foot pain/limitations.  Goal status: Not met  PLAN:  PT FREQUENCY: 1-2x/week  PT DURATION: 4 weeks  PLANNED INTERVENTIONS: 97110-Therapeutic exercises, 97530- Therapeutic activity, [id number]- Neuromuscular re-education, 97535- Self Care, [id number]- Manual therapy, [phone]- Gait training, [phone]- Ionotophoresis 4mg /ml  Dexamethasone , Patient/Family education, Balance training, Stair training, Taping, Dry Needling, Joint mobilization, Cryotherapy, and Moist heat  PLAN FOR NEXT SESSION:  Pt. Will contact PT in a couple weeks or if regression in symptoms.     Lendell Quarry, PT, DPT # [phone] Physical Therapist - [facility]  11/17/2023, 3:39 PM

## 2023-11-25 DIAGNOSIS — L732 Hidradenitis suppurativa: Principal | ICD-10-CM

## 2023-11-25 DIAGNOSIS — R52 Pain, unspecified: Principal | ICD-10-CM

## 2023-11-25 MED ORDER — OXYCODONE-ACETAMINOPHEN 5 MG-325 MG TABLET
ORAL_TABLET | Freq: Four times a day (QID) | ORAL | 0 refills | 3.00000 days | Status: CP | PRN
Start: 2023-11-25 — End: ?

## 2023-11-27 ENCOUNTER — Inpatient Hospital Stay: Admit: 2023-11-27 | Discharge: 2023-11-28 | Payer: Medicare (Managed Care)

## 2023-11-27 DIAGNOSIS — Z794 Long term (current) use of insulin: Principal | ICD-10-CM

## 2023-11-27 DIAGNOSIS — I10 Essential (primary) hypertension: Principal | ICD-10-CM

## 2023-11-27 DIAGNOSIS — E1165 Type 2 diabetes mellitus with hyperglycemia: Principal | ICD-10-CM

## 2023-11-27 DIAGNOSIS — M25522 Pain in left elbow: Principal | ICD-10-CM

## 2023-11-27 MED ORDER — MICONAZOLE NITRATE 2 % TOPICAL CREAM
TOPICAL | 1 refills | 0.00000 days | Status: CP
Start: 2023-11-27 — End: 2024-11-26

## 2023-11-27 MED ORDER — CLOTRIMAZOLE 1 % TOPICAL CREAM
Freq: Two times a day (BID) | TOPICAL | 1 refills | 0.00000 days | Status: CP
Start: 2023-11-27 — End: 2024-11-26

## 2023-11-27 MED ORDER — NAPROXEN 500 MG TABLET
ORAL_TABLET | Freq: Two times a day (BID) | ORAL | 0 refills | 15.00000 days | Status: CP
Start: 2023-11-27 — End: 2024-11-26

## 2023-11-27 MED ORDER — LOSARTAN 50 MG TABLET
ORAL_TABLET | Freq: Every day | ORAL | 3 refills | 90.00000 days | Status: CP
Start: 2023-11-27 — End: 2024-11-26

## 2023-11-27 NOTE — Unmapped (Signed)
 Assessment and Plan:     Assessment & Plan  Elbow Pain  Chronic severe elbow pain for one month, likely not arthritis. Differential includes osteomyelitis or septic joint. Heat therapy provides some relief. High-dose NSAIDs preferred over steroids due to potential hyperglycemia.  - Order plain x-ray of the elbow.  - Prescribe naproxen  500 mg twice daily for up to a week, advise to take with food.  - Instruct to avoid heavy lifting and prolonged arm elevation.    Hidradenitis Suppurativa (HS)  Major HS flare-up with painful lesions. Percocet provides temporary relief. Dermatologist considering discontinuing Cosentyx  due to lack of response and potential progression to stage 3. Discussed potential infusion therapy with similar side effects.  - Continue Percocet as needed.  - Discuss potential infusion therapy with dermatologist.    Hypertension  Requires updated losartan  prescription with adjusted dose.  - Send updated losartan  prescription to pharmacy.    Athlete's Foot  Small bumps on foot consistent with athlete's foot.  - Prescribe Monistat  cream.    General Health Maintenance  Requests refill for Differin cream as part of skincare regimen.  - Refill Differin cream prescription.             Type 2 diabetes mellitus with hyperglycemia, with long-term current use of insulin          Essential hypertension  - losartan  (COZAAR ) 50 MG tablet; Take 1.5 tablets (75 mg total) by mouth daily.    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 The recording would write down what we discuss and once reviewed and finalized would become a part of the chart note for today's visit. Patient amenable to recording.     I personally spent 30 minutes face-to-face and non-face-to-face in the care of this patient, which includes all pre, intra, and post visit time on the date of service.    Return if symptoms worsen or fail to improve.    HPI:      Logan Quinn. is here for   Chief Complaint   Patient presents with    Elbow Pain     Left elbow painful and hard to lift or carry stuff x 1 month.  Tried tennis elbow strap but it makes pain worse.                History of Present Illness  Logan Quinn. is a 43 year old male with hidradenitis suppurativa who presents with elbow pain.    He has been experiencing significant pain in his left elbow for about a month. The pain is severe, making it difficult to lift objects or hold his phone for more than five to ten minutes, and he likens it to 'holding five hundred pounds in that one arm.' There is no known injury to the elbow, and he is unsure of any prolonged pressure that could have caused the pain. He is right-hand dominant and uses a cane with his right arm for plantar fasciitis, but the pain is localized to the left elbow. He has tried using an elbow strap, which worsened the pain, and has been using a heated rice bag for relief. The pain is exacerbated by activities such as holding a choir book  at church or driving, where he needs to frequently change the position of his arm to alleviate discomfort. No redness or swelling noted in the elbow. He has not been on any recent antibiotics for this issue and has not used any anti-inflammatory medications like naproxen  or ibuprofen .    He has a history of hidradenitis suppurativa, which is currently flaring up. The affected areas are painful and sensitive, with irritation from clothing contact. He has been prescribed Percocet for pain management, which provides temporary relief. He has a standing prescription for clindamycin  for HS flares but avoids it due to gastrointestinal side effects. The last antibiotic use was in April 2025.    There is a family history of arthritis, with his grandmother having rheumatoid arthritis and his father also experiencing arthritis. He is concerned about the potential for developing arthritis himself.     ROS:      Comprehensive 10 point ROS negative unless otherwise stated in the HPI.      [facility] Components:     Medication adherence and barriers to the treatment plan have been addressed. Opportunities to optimize healthy behaviors have been discussed. Patient / caregiver voiced understanding.    Past Medical/Surgical History:     Past Medical History[1]  Past Surgical History[2]    Family History:     Family History[3]    Social History:     Short Social History[4]    Allergies:     Erythromycin, Penicillins, Sulfa (sulfonamide antibiotics), Other, and Metronidazole    Current Medications:     Current Medications[5]    Health Maintenance:     Health Maintenance   Topic Date Due    COVID-19 Vaccine (3 - Pfizer risk series) 01/24/2021    Retinal Eye Exam  10/31/2022    DTaP/Tdap/Td Vaccines (4 - Td or Tdap) 07/17/2023    Foot Exam  11/25/2023    Urine Albumin/Creatinine Ratio  11/26/2023    Hemoglobin A1c  01/07/2024    Serum Creatinine Monitoring  03/04/2024    Potassium Monitoring  03/04/2024    COPD Spirometry  08/06/2026    Pneumococcal Vaccine 0-49  Completed    Hepatitis C Screen  Completed    Influenza Vaccine  Completed       Immunizations:     Immunization History   Administered Date(s) Administered    COVID-19 VACC,MRNA,(PFIZER)(PF) 12/04/2020, 12/27/2020    HEPATITIS B VACCINE ADULT,IM(ENERGIX B, RECOMBIVAX) 03/05/2013, 04/05/2013, 09/03/2013    Hepatitis B Vaccine, Unspecified Formulation 12/27/1999, 04/29/2000    INFLUENZA INJ MDCK PF, QUAD,(FLUCELVAX )( AND UP EGG FREE) 03/31/2019    INFLUENZA TIV (TRI) PF (IM)(HISTORICAL) 10/08/2011    INFLUENZA VACCINE IIV3(IM)(PF)6 MOS UP 03/05/2023    Influenza Vaccine Quad(IM)6 MO-Adult(PF) 03/05/2013, 03/18/2014, 03/22/2015, 03/05/2016, 03/19/2017, 02/23/2018, 02/19/2022    Influenza Virus Vaccine, unspecified formulation 03/19/2017, 02/23/2018, 03/25/2019, 03/10/2020, 04/05/2021 PNEUMOCOCCAL POLYSACCHARIDE 23-VALENT 12/30/2012    PPD Test 10/28/2016    Pneumococcal Conjugate 20-valent 01/02/2022    TD(TDVAX),ADSORBED,2LF(IM)(PF) 04/29/2000    TdaP 07/18/2008, 07/16/2013     I have reviewed and (if needed) updated the patient's problem list, medications, allergies, past medical and surgical history, social and family history.     Vital Signs:     Wt Readings from Last 3 Encounters:   11/27/23 (!) 211.8 kg (467 lb)   10/08/23 (!) 214.6 kg (473 lb)   09/17/23 (!) 214.6 kg (473 lb)     Temp Readings from Last 3 Encounters:   11/27/23 37.1 ??C (98.7 ??F) (Oral)  10/08/23 36.5 ??C (97.7 ??F) (Oral)   09/17/23 36.7 ??C (98.1 ??F) (Oral)     BP Readings from Last 3 Encounters:   11/27/23 140/95   10/08/23 162/104   09/17/23 140/70     Pulse Readings from Last 3 Encounters:   11/27/23 66   10/08/23 66   09/17/23 89     Estimated body mass index is 65.13 kg/m?? as calculated from the following:    Height as of this encounter: 180.3 cm (5' 11).    Weight as of this encounter: 211.8 kg (467 lb).  Facility age limit for growth %iles is 20 years.      Objective:       General: Alert and oriented x3. Well-appearing. No acute distress.   HEENT:  Normocephalic.  Atraumatic. Conjunctiva and sclera normal. OP MMM without lesions.   Neck:  Supple. No thyroid enlargement. No adenopathy.   Heart:  Regular rate and rhythm. Normal S1, S2. No murmurs, rubs or gallops.   Lungs:  No respiratory distress.  Lungs clear to auscultation. No wheezes, rhonchi, or rales.   GI/GU:  Soft, obese, +BS, nondistended, non-TTP. No palpable masses or organomegaly.  MSK:  moderate left olecranon joint TTP, no visible effusion or swelling, no heat or erythema over joint. Full ROM.   Extremities:  No edema. Peripheral pulses normal.   Skin:  Warm, dry. No rash or lesions present.   Neuro:  Non-focal. No obvious weakness.   Psych:  Affect normal, eye contact good, speech clear and coherent.           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    Liver disease     Migraines     Mild intermittent asthma without complication (HHS-HCC) 10/11/2021    Morbid obesity with BMI of 60.0-69.9, adult (CMS-HCC)     Neuropathy in diabetes        Obstructive sleep apnea     OSA on CPAP     Retinal detachment 04/2014    Severe obstructive sleep apnea     Trapezius muscle strain 12/07/2013    Urinary incontinence, nocturnal enuresis     Venous insufficiency    [2]   Past Surgical History:  Procedure Laterality Date    COLONOSCOPY      CYSTOSCOPY      EYE SURGERY  04/2014    LASER ABLATION INCOMPETENT VEIN INI [facility] HISTORICAL RESULT) Right     PR COLONOSCOPY FLX DX W/COLLJ SPEC WHEN PFRMD N/A 03/24/2013    Procedure: COLONOSCOPY, FLEXIBLE, PROXIMAL TO SPLENIC FLEXURE; DIAGNOSTIC, W/WO COLLECTION SPECIMEN BY BRUSH OR WASH;  Surgeon: Chauncey GORMAN Pfeiffer, MD;  Location: [facility];  Service: Gastroenterology    PR EYE SURG POST SGMT PROC UNLISTED Left     pneumatic retinopexy OS    PR UPPER GI ENDOSCOPY,DIAGNOSIS N/A 02/02/2013    Procedure: UGI ENDO, INCLUDE ESOPHAGUS, STOMACH, &  DUODENUM &/OR JEJUNUM; DX W/WO COLLECTION SPECIMN, BY BRUSH OR WASH;  Surgeon: Olam CHRISTELLA Henle, MD;  Location: [facility];  Service: Gastroenterology    SKIN BIOPSY      TOOTH EXTRACTION  10/06/2023    UPPER GASTROINTESTINAL ENDOSCOPY      US  PYLORIC STENOSIS ([place] HISTORICAL RESULT)     [3]   Family History  Problem Relation Age of Onset    Cancer Maternal Grandfather         Stomach Cancer    Hearing loss Maternal Grandfather     GER disease Maternal Grandfather     Cancer Paternal Grandfather         Bone Cancer, Lung Cancer    COPD Paternal Grandmother     Arthritis Paternal Grandmother     Depression Paternal Grandmother     Osteoporosis Paternal Grandmother     Diabetes Mother     Heart disease Mother     Migraines Mother     Arthritis Mother     Depression Mother     GER disease Mother     Hypertension Mother     Angina Mother     COPD Mother     Glaucoma Mother     Hearing loss Mother     Cataracts Mother     Stroke Mother     Miscarriages / [place] Mother     Diabetes Sister     Migraines Sister     Asthma Sister     Depression Sister     Hearing loss Sister     Diabetes Brother     Asthma Brother     Diabetes Maternal Grandmother     Heart disease Maternal Grandmother     Migraines Maternal Grandmother     Depression Maternal Grandmother     Angina Maternal Grandmother     Hypertension Maternal Grandmother     Stroke Maternal Grandmother     GER disease Maternal Grandmother     Diabetes Maternal Uncle     Hearing loss Maternal Uncle     Diabetes Maternal Uncle         resulted in need for kidney transplant    Liver disease Maternal Uncle     Kidney disease Maternal Uncle         needed kidney transplant    Depression Maternal Uncle     Asthma Brother     Gout Father     No Known Problems Paternal Aunt     No Known Problems Paternal Uncle     No Known Problems Other     Diabetes Sister     Asthma Sister     Depression Sister     Hearing loss Sister     Migraines Sister     Diabetes Brother     Asthma Brother     Broken bones Brother     Diabetes Maternal Uncle     Hearing loss Maternal Uncle     Asthma Brother     Colorectal Cancer Neg Hx     Esophageal cancer Neg Hx     Liver cancer Neg Hx     Pancreatic cancer Neg Hx     Stomach cancer Neg Hx     Amblyopia Neg Hx     Blindness Neg Hx     Retinal detachment Neg Hx     Strabismus Neg Hx     Macular degeneration Neg Hx     Anesthesia problems Neg  Hx     Clotting disorder Neg Hx     Collagen disease Neg Hx     Dislocations Neg Hx     Fibromyalgia Neg Hx Hemophilia Neg Hx     Rheumatologic disease Neg Hx     Scoliosis Neg Hx     Severe sprains Neg Hx     Sickle cell anemia Neg Hx     Spinal Compression Fracture Neg Hx     Melanoma Neg Hx     Basal cell carcinoma Neg Hx     Squamous cell carcinoma Neg Hx     Deep vein thrombosis Neg Hx     Thyroid disease Neg Hx    [4]   Social History  Tobacco Use    Smoking status: Never     Passive exposure: Never    Smokeless tobacco: Never   Vaping Use    Vaping status: Never Used   Substance Use Topics    Alcohol use: Never     Comment: rare social    Drug use: Never   [5]   Current Outpatient Medications   Medication Sig Dispense Refill    allopurinol  (ZYLOPRIM ) 300 MG tablet Take 1 tablet (300 mg total) by mouth daily. 90 tablet 3    ALPRAZolam  (XANAX ) 0.5 MG tablet Takes 1 tablet nightly and 1 tablet a day when needed      anastrozole  (ARIMIDEX ) 1 mg tablet Take 1 tablet (1 mg total) by mouth daily. 30 tablet 11    ascorbic acid (VITAMIN C ORAL) Take 1 capsule by mouth nightly.      atorvastatin  (LIPITOR) 20 MG tablet Take 1 tablet (20 mg total) by mouth daily. 90 tablet 3    blood sugar diagnostic (ACCU-CHEK GUIDE TEST STRIPS) Strp by Other route Three (3) times a day before meals. 300 strip 3    blood-glucose meter kit Use as directed 1 each 0    budesonide -formoterol  (SYMBICORT ) 80-4.5 mcg/actuation inhaler Inhale 2 puffs two (2) times a day. 10.2 g 0    clindamycin  (CLEOCIN  T) 1 % lotion Apply topically two (2) times a day. To affected areas on body as needed for flares 60 mL 3    clindamycin  (CLEOCIN ) 150 MG capsule Take 1 capsule (150 mg total) by mouth Three (3) times a day.      clobetasol  (TEMOVATE ) 0.05 % ointment Apply to HS lesions twice daily for 5-7 days as needed for flares 60 g 1    cyclobenzaprine  (FLEXERIL ) 10 MG tablet Take 1 tablet (10 mg total) by mouth Three (3) times a day as needed for muscle spasms. 60 tablet 1    dextroamphetamine  sulfate (DEXTROSTAT ) 10 MG tablet Take 3 tablets (30 mg total) by mouth two (2) times a day.      dicyclomine  (BENTYL ) 10 mg capsule Take 1 capsule (10 mg total) by mouth Four (4) times a day (before meals and nightly). 120 capsule 1    dulaglutide  (TRULICITY ) 3 mg/0.5 mL injection pen Inject 0.5 mL (3 mg total) under the skin every seven (7) days. 2 mL 2    DULoxetine  (CYMBALTA ) 60 MG capsule Take 1 capsule (60 mg total) by mouth Two (2) times a day. 180 capsule 0    empty container Misc Use as directed to dispose of Cosentyx  pens. 1 each 2    erenumab -aooe (AIMOVIG  AUTOINJECTOR) auto-injector Inject 1 Pen under the skin every thirty (30) days. 3 mL 3    famotidine  (PEPCID ) 20 MG tablet Take  1 tablet (20 mg total) by mouth two (2) times a day. 180 tablet 3    fluconazole  (DIFLUCAN ) 150 MG tablet Take 150 mg now and repeat 150 mg in 5-7 days. 2 tablet 1    fluocinonide (LIDEX) 0.05 % gel Apply 1 Application topically Three (3) times a day.      fluoride, sodium, 1.1 % Pste       fluticasone  propionate (FLONASE ) 50 mcg/actuation nasal spray 1 spray into each nostril two (2) times a day. 16 g 11    glipiZIDE  (GLUCOTROL ) 10 MG tablet Take 2 tablets (20 mg total) by mouth Two (2) times a day (30 minutes before a meal). 360 tablet 3    HUMALOG  U-100 INSULIN  100 unit/mL injection INJECT 120 UNITS UNDER THE SKIN DAILY VIA OMNIPOD 100 mL 3    hydrOXYzine  (ATARAX ) 25 MG tablet Take 1 tablet (25 mg total) by mouth Three (3) times a day as needed. May take two before bed for sleep. 120 tablet 1    ibuprofen  (ADVIL ,MOTRIN ) 800 MG tablet Take 1 tablet (800 mg total) by mouth every eight (8) hours as needed. 90 tablet 1    insulin  glargine (LANTUS  U-100 INSULIN ) 100 unit/mL injection Inject 0.2 mL (20 Units total) under the skin nightly. 18 mL 3    insulin  syringe-needle U-100 (BD INSULIN  SYRINGE ULTRA-FINE) 1 mL 31 gauge x 5/16 (8 mm) Syrg Once daily for insulin  dosing. 100 each 3    insulin  syringe-needle U-100 1 mL 31 gauge x 5/16 (8 mm) Syrg Once daily for insulin  dosing. 100 each 3 lancets (ACCU-CHEK FASTCLIX LANCET DRUM) Misc Use to check blood sugars 3 times a day before meals or as directed 200 each 5    levothyroxine  (SYNTHROID ) 50 MCG tablet Take 1 tablet (50 mcg total) by mouth daily. 90 tablet 1    lidocaine  (XYLOCAINE ) 5 % ointment Apply topically Three (3) times a day as needed. 30 g 1    losartan  (COZAAR ) 50 MG tablet Take 1 tablet (50 mg total) by mouth daily. 90 tablet 3    montelukast  (SINGULAIR ) 10 mg tablet Take 1 tablet (10 mg total) by mouth daily. 90 tablet 3    multivitamin (MULTIPLE VITAMIN ORAL) Take 1 tablet by mouth daily.      OMNIPOD 5 PACK PODS Omnipod 5 G6/G7 system POD (Gen 5) (5 PODS) Change POD every 48 hours 3 each 11    ondansetron  (ZOFRAN ) 4 MG tablet Take 1 tablet (4 mg total) by mouth every eight (8) hours as needed for nausea. 30 tablet 1    oxybutynin  (DITROPAN ) 5 MG tablet Take 1 tablet (5 mg total) by mouth two (2) times a day. 180 tablet 1    oxyCODONE -acetaminophen  (PERCOCET) 5-325 mg per tablet Take 1 tablet by mouth every six (6) hours as needed for pain. 12 tablet 0    propranoloL  (INDERAL  LA) 120 mg 24 hr capsule Take 1 capsule (120 mg total) by mouth daily. 90 capsule 1    ramelteon (ROZEREM) 8 mg tablet Take 1 tablet (8 mg total) by mouth nightly.      safety needles (BD SAFETYGLIDE NEEDLE) 18 gauge x 1 1/2 Ndle Inject 1 each into the muscle once a week. 50 each 0    secukinumab  (COSENTYX  UNOREADY PEN) 300 mg/2 mL PnIj Inject the contents of 1 pen (300 mg) under the skin every 28 days 2 mL 11    secukinumab  (COSENTYX  UNOREADY PEN) 300 mg/2 mL PnIj Inject the contents  of 1 pen (300 mg) under the skin once weekly for 5 weeks (on weeks 0,1,2,3,4) for loading dose. 10 mL 0    sour cherry extract (TART CHERRY EXTRACT) 1,000 mg cap       syringe with needle (BD LUER-LOK SYRINGE) 3 mL 21 gauge x 1 1/2 Syrg To draw up testosterone  once weekly 100 each 0    tadalafil  (CIALIS ) 5 MG tablet Take 1 tablet (5 mg total) by mouth in the morning. 90 tablet 3 testosterone  cypionate (DEPOTESTOTERONE CYPIONATE) 200 mg/mL injection Inject 0.4 mL (80 mg total) into the muscle once a week. Inject 0.4cc (80mg ) weekly. Discard after each use 4 mL 5    topiramate  (TOPAMAX ) 50 MG tablet Take 1.5 tablets (75 mg total) by mouth two (2) times a day. 270 tablet 3    traMADol  (ULTRAM ) 50 mg tablet Take 1 tablet (50 mg total) by mouth every six (6) hours. As needed for pain 20 tablet 0    WELLBUTRIN XL 150 mg 24 hr tablet Take 1 tablet (150 mg total) by mouth every morning.       No current facility-administered medications for this visit.

## 2023-12-01 NOTE — Unmapped (Signed)
 Left message wanting results.

## 2023-12-17 NOTE — Unmapped (Signed)
 [facility] Refill Coordination Note    Specialty Medication(s) to be Shipped:   Inflammatory Disorders: Humira     Other medication(s) to be shipped: No additional medications requested for fill at this time     Logan Skillern., DOB: [birth year]  Phone: There are no phone numbers on file.      All above HIPAA information was verified with patient.     Was a Nurse, learning disability used for this call? No    Completed refill call assessment today to schedule patient's medication shipment from the [facility]  [phone]).  All relevant notes have been reviewed.     Specialty medication(s) and dose(s) confirmed: Regimen is correct and unchanged.   Changes to medications: Kamonte reports no changes at this time.  Changes to insurance: No  New side effects reported not previously addressed with a pharmacist or physician: None reported  Questions for the pharmacist: No    Confirmed patient received a Conservation officer, historic buildings and a Surveyor, mining with first shipment. The patient will receive a drug information handout for each medication shipped and additional FDA Medication Guides as required.       DISEASE/MEDICATION-SPECIFIC INFORMATION        For patients on injectable medications: Patient currently has 0 doses left.  Next injection is scheduled for 7/10-11.Pt took last injection around 6/12    SPECIALTY MEDICATION ADHERENCE     Medication Adherence    Patient reported X missed doses in the last month: 0  Specialty Medication: COSENTYX  UNOREADY PEN 300 mg/2 mL Pnij (secukinumab )  Patient is on additional specialty medications: No  Informant: patient              Were doses missed due to medication being on hold? No    Humira  80/0.8 mg/ml: 0 doses of medicine on hand     REFERRAL TO PHARMACIST     Referral to the pharmacist: Not needed      [facility]     Shipping address confirmed in Epic.       Delivery Scheduled: Yes, Expected medication delivery date: 7/11. Medication will be delivered via Same Day Courier to the prescription address in Epic WAM.    Logan Quinn [facility]

## 2023-12-19 ENCOUNTER — Ambulatory Visit (INDEPENDENT_AMBULATORY_CARE_PROVIDER_SITE_OTHER)

## 2023-12-19 ENCOUNTER — Ambulatory Visit
Admission: RE | Admit: 2023-12-19 | Discharge: 2023-12-19 | Disposition: A | Discharge status: Home / Self Care | Source: Ambulatory Visit | Attending: Emergency Medicine | Admitting: Emergency Medicine

## 2023-12-19 ENCOUNTER — Ambulatory Visit (HOSPITAL_COMMUNITY): Payer: Self-pay

## 2023-12-19 VITALS — BP 145/88 | HR 76 | Temp 98.5°F | Resp 18 | Ht 71.0 in | Wt >= 6400 oz

## 2023-12-19 DIAGNOSIS — M79645 Pain in left finger(s): Secondary | ICD-10-CM

## 2023-12-19 DIAGNOSIS — S62605A Fracture of unspecified phalanx of left ring finger, initial encounter for closed fracture: Secondary | ICD-10-CM

## 2023-12-19 MED FILL — COSENTYX UNOREADY PEN 300 MG/2 ML SUBCUTANEOUS PEN INJECTOR: 28 days supply | Qty: 2 | Fill #1

## 2023-12-19 NOTE — ED Provider Notes (Addendum)
 [facility]    CSN: [id number] Arrival date & time: 12/19/23  1141      History   Chief Complaint Chief Complaint  Patient presents with   Finger Injury    Entered by patient   Fall    HPI Jason Davidson is a 43 y.o. male.   HPI  43 year old male with past medical history significant for lymphedema, chronic venous insufficiency, depression, diabetes, GERD, anxiety, IBS, ED, essential hypertension, and mild intermittent asthma presents for evaluation of pain and bruising to the left fourth finger after falling getting out of the shower last night.  He also was complaining of low back pain and left hip pain but reports that his biggest concern is actually his finger.  He does describe numbness at the tip of the finger and has decreased range of motion  Past Medical History:  Diagnosis Date   Asthma    Depressed    Diabetes mellitus without complication (HCC)    Gout    IBS (irritable bowel syndrome)    Morbid obesity (HCC)    Sleep apnea    Thyroid disease     Patient Active Problem List   Diagnosis Date Noted   Sprain of left ankle 07/03/2022   Tinea pedis 03/18/2022   Chronic pansinusitis 11/28/2021   Cough variant asthma 11/28/2021   Environmental and seasonal allergies 11/28/2021   Erectile dysfunction 11/28/2021   Nasal congestion 11/28/2021   Sinus congestion 11/28/2021   Mild intermittent asthma without complication 10/11/2021   Essential hypertension 10/10/2021   Acquired hypothyroidism 01/09/2021   Acute non-recurrent pansinusitis 07/19/2020   Hidradenitis suppurativa 04/29/2018   Allergic rhinitis 12/23/2017   Varicose veins of both lower extremities with pain 10/16/2017   IBS (irritable bowel syndrome) 06/18/2017   Chronic venous insufficiency 12/31/2016   Lymphedema 12/31/2016   Pain in joint, ankle and foot 12/31/2016   Diabetes (HCC) 12/31/2016   GERD (gastroesophageal reflux disease) 12/31/2016   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   H/O retinal detachment 10/01/2016   No diabetic retinopathy in both eyes 10/01/2016   Cellulitis of right lower extremity 06/19/2016   Chronic joint pain 11/16/2015   Skin infection 05/15/2015   Depressed    Tick bite 10/20/2014   Primary insomnia 07/08/2014   Candidal balanitis 02/16/2014   Skin tag 01/17/2014   Left shoulder pain 10/04/2013   Anxiety 09/13/2013   Diarrhea 09/13/2013   Bleeding external hemorrhoids 09/03/2013   NAFLD (nonalcoholic fatty liver disease) 96/94/7984   Headache 07/05/2013   Abdominal pain 03/05/2013   Morbid (severe) obesity due to excess calories (HCC) 03/05/2013   Suicidal ideation 02/09/2013   Obstructive sleep apnea (adult) (pediatric) 07/21/2012   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 Authorizing Provider  Adalimumab 40 MG/0.4ML PNKT Inject into the skin. 09/10/18   [provider]  AIMOVIG 70 MG/ML SOAJ  07/21/17   [provider]  albuterol  (VENTOLIN  HFA) 108 (90 Base) MCG/ACT inhaler Inhale 1-2 puffs into the lungs every 6 (six) hours as needed for wheezing or shortness of breath. 04/06/20   Bernardino Ditch, NP  allopurinol (ZYLOPRIM) 300 MG tablet Take  1 tablet by mouth daily. 07/08/23 07/07/24  [provider]  ALPRAZolam (XANAX) 0.5 MG tablet Take 0.5 mg by mouth at bedtime as needed for anxiety.    [provider]  anastrozole (ARIMIDEX) 1 MG tablet Take by mouth. 06/18/22   [provider]  atorvastatin (LIPITOR) 20 MG tablet Take 20 mg by mouth daily.    [provider]  azelastine (ASTELIN) 0.1 % nasal spray Place into the nose.    [provider]  busPIRone (BUSPAR) 10 MG  tablet Take by mouth. 12/22/17   [provider]  clindamycin  (CLEOCIN  T) 1 % lotion Apply topically. 07/24/23 07/23/24  [provider]  clotrimazole -betamethasone  (LOTRISONE ) cream Apply 1 Application topically daily. 08/12/22   Silva Juliene SAUNDERS, DPM  Colchicine 0.6 MG CAPS Take 1.2 mg on Day 1 of flare, followed by 0.6 mg one hour later. Day 2 take 0.6 mg BID until flare resolves. 08/01/17   [provider]  cyclobenzaprine  (FLEXERIL ) 10 MG tablet Take 1 tablet (10 mg total) by mouth 3 (three) times daily as needed. 01/31/18   Claudene Tanda POUR, PA-C  dextroamphetamine (DEXTROSTAT) 10 MG tablet Take 20 mg by mouth 2 (two) times daily.    [provider]  dicyclomine (BENTYL) 10 MG capsule Take 10 mg by mouth 3 (three) times daily before meals.    [provider]  diphenoxylate-atropine (LOMOTIL) 2.5-0.025 MG tablet Take by mouth. 01/05/18   [provider]  DULoxetine (CYMBALTA) 60 MG capsule Take 120 mg by mouth daily.     [provider]  famotidine (PEPCID) 20 MG tablet Take by mouth. 10/15/18 08/13/24  [provider]  fluticasone  (FLONASE) 50 MCG/ACT nasal spray Place 1 spray into both nostrils 2 (two) times daily. 01/03/21   [provider]  glipiZIDE (GLUCOTROL) 10 MG tablet Take 10 mg by mouth daily before breakfast.    [provider]  glucose blood test strip Use 1 strip 3 times a day with meter 06/26/15   [provider]  HUMALOG 100 UNIT/ML injection SMARTSIG:120 Unit(s) SUB-Q Daily 03/01/20   [provider]  hydrochlorothiazide (HYDRODIURIL) 12.5 MG tablet Take 12.5 mg by mouth daily. 02/05/21   [provider]  hydrocortisone (ANUSOL-HC) 2.5 % rectal cream Place rectally. 12/16/16   [provider]  hydrOXYzine (ATARAX/VISTARIL) 25 MG tablet Take 25 mg by mouth 4 (four) times daily as needed. 11/17/20   [provider]  Insulin Syringe-Needle U-100 31G X 5/16 1 ML MISC  Use as directed with the Lantus Insulin nightly 08/26/17   [provider]  ketoconazole (NIZORAL) 2 % cream Apply topically daily. 01/10/21   [provider]  LANTUS 100 UNIT/ML injection Inject into the skin.    [provider]  levothyroxine (SYNTHROID) 50 MCG tablet Take by mouth. 10/05/18 08/13/24  [provider]  levothyroxine (SYNTHROID) 50 MCG tablet Take 1 tablet by mouth daily. 02/17/22 08/13/24  [provider]  lidocaine  (XYLOCAINE ) 2 % solution Use as directed 15 mLs in the mouth or throat as needed for mouth pain. 06/14/21   Corlis Burnard DEL, NP  loperamide (IMODIUM) 2 MG capsule Take by mouth.    [provider]  losartan (COZAAR) 50 MG tablet Take 1 tablet by mouth daily. 07/17/23 07/16/24  [provider]  miconazole  (MICOTIN) 2 % cream Apply 1 Application topically 2 (two) times daily. 04/10/23   Mortenson, Ashley, MD  Mirtazapine (REMERON PO)  10/23/21   [provider]  montelukast (SINGULAIR) 10 MG tablet Take by mouth. 03/31/17 08/13/24  [provider]  montelukast (SINGULAIR) 10 MG tablet Take 1 tablet by mouth daily.    [provider]  ondansetron  (ZOFRAN ) 4 MG tablet Take by mouth. 12/13/22 12/11/53  [provider]  ondansetron  (ZOFRAN -ODT) 4 MG disintegrating tablet Take 4 mg by mouth every 8 (eight) hours as needed. 01/09/21   [provider]  oxybutynin (DITROPAN) 5 MG tablet Take 5 mg by mouth 2 (two) times daily. 01/09/21   [provider]  oxybutynin (DITROPAN) 5 MG tablet Take 1 tablet by mouth 2 (two) times daily. 06/06/21   [provider]  pantoprazole (PROTONIX) 40 MG tablet Take 40 mg by mouth daily.    [provider]  polyethylene glycol powder (GLYCOLAX/MIRALAX) powder take 17GM (DISSOLVED IN WATER) by mouth once daily 07/25/17   [provider]  PROAIR  HFA 108 (90 Base) MCG/ACT inhaler 1-2 puffs every 4 (four) hours as needed. Last used:  [id number] 07/21/17   [provider]  promethazine  (PHENERGAN ) 25 MG tablet Take 25-50 mg by mouth at bedtime as needed. 01/09/21   [provider]  propranolol (INNOPRAN XL) 120 MG 24 hr capsule Take 120 mg by mouth at bedtime.    [provider]  propranolol ER (INDERAL LA) 120 MG 24 hr capsule Take 1 capsule by mouth daily. 12/22/17   [provider]  ramelteon (ROZEREM) 8 MG tablet Take 8 mg by mouth at bedtime. 06/22/21   [provider]  ramelteon (ROZEREM) 8 MG tablet Take by mouth. 05/25/21   [provider]  sodium fluoride (FLUORISHIELD) 1.1 % GEL dental gel  01/29/21   [provider]  tadalafil (CIALIS) 5 MG tablet Take by mouth. 02/14/22   [provider]  testosterone cypionate (DEPOTESTOSTERONE CYPIONATE) 200 MG/ML injection Inject into the muscle. 02/14/22   [provider]  topiramate (TOPAMAX) 50 MG tablet Take 50 mg by mouth 2 (two) times daily.    [provider]  TRULICITY 1.5 MG/0.5ML SOAJ Inject into the skin. 08/08/23   [provider]  WELLBUTRIN XL 150 MG 24 hr tablet Take 1 tablet by mouth every morning. 04/18/22   [provider]    Family History Family History  Problem Relation Age of Onset   Diabetes Mother    Depression Mother    Diabetes Sister    Diabetes Brother    Diabetes Maternal Uncle    Diabetes Maternal Grandmother    Heart disease Maternal Grandmother    Cancer Maternal Grandfather    COPD Paternal Grandmother    Cancer Paternal Grandfather    Varicose Veins Paternal Grandfather    Healthy Father     Social History Social History   Tobacco Use   Smoking status: Never   Smokeless tobacco: Never  Vaping Use   Vaping status: Never Used  Substance Use Topics   Alcohol use: No   Drug use: No     Allergies   Penicillins, Sulfa antibiotics, Erythromycin, and Metronidazole    Review of Systems Review of Systems  Musculoskeletal:  Positive  for arthralgias. Negative for joint swelling.  Skin:  Positive for color change.  Neurological:  Positive for numbness. Negative for weakness.     Physical Exam Triage Vital Signs ED Triage Vitals  Encounter Vitals Group     BP      Girls Systolic BP Percentile      Girls Diastolic BP Percentile  Boys Systolic BP Percentile      Boys Diastolic BP Percentile      Pulse      Resp      Temp      Temp src      SpO2      Weight      Height      Head Circumference      Peak Flow      Pain Score      Pain Loc      Pain Education      Exclude from Growth Chart    No data found.  Updated Vital Signs BP (!) 145/88 (BP Location: Left Arm)   Pulse 76   Temp 98.5 F (36.9 C) (Oral)   Resp 18   Ht 5' 11 (1.803 m)   Wt (!) 478 lb 6.4 oz (217 kg)   SpO2 98%   BMI 66.72 kg/m   Visual Acuity Right Eye Distance:   Left Eye Distance:   Bilateral Distance:    Right Eye Near:   Left Eye Near:    Bilateral Near:     Physical Exam Vitals and nursing note reviewed.  Constitutional:      Appearance: Normal appearance. He is obese.  HENT:     Head: Normocephalic and atraumatic.  Musculoskeletal:        General: Tenderness and signs of injury present. No swelling or deformity.  Skin:    General: Skin is warm and dry.     Capillary Refill: Capillary refill takes less than 2 seconds.     Findings: Bruising present.  Neurological:     General: No focal deficit present.     Mental Status: He is alert and oriented to person, place, and time.      UC Treatments / Results  Labs (all labs ordered are listed, but only abnormal results are displayed) Labs Reviewed - No data to display  EKG   Radiology No results found.  Procedures Procedures (including critical care time)  Medications Ordered in UC Medications - No data to display  Initial Impression / Assessment and Plan / UC Course  I have reviewed the triage vital signs and the nursing notes.  Pertinent  labs & imaging results that were available during my care of the patient were reviewed by me and considered in my medical decision making (see chart for details).   Patient is a nontoxic-appearing 43 year old male presenting for evaluation of left fourth finger pain as outlined HPI above.  He also reports low back pain and left hip pain though when I attempted to examine him he stated that he can manage with those and that his greatest concern was actually his finger.  And as you can see in image above, there is ecchymosis forming the bulbar aspect of the proximal phalanx of the left fourth finger.  The dorsum of the hand is unremarkable.  Patient is complaining of pain with palpation of the entire finger.  No appreciable crepitus.  When I asked him to go through range of motion he reports that he cannot flex his finger at all.  I am able to flex it passively.  I will obtain a radiograph of the left fourth finger to rule out bony abnormality.  Left fourth finger x-rays independently reviewed and evaluated by me.  Impression: There is a questionable bone cyst in the proximal aspect of the proximal phalanx.  In the oblique view there is a questionable nondisplaced fracture of the  distal aspect of the shaft of the proximal phalanx going into the metaphysis.  On the lateral view there is a nondisplaced fracture of the proximal aspect of the middle phalanx.  Radiology read is pending. Radiology impression states stable enchondroma of left fourth finger.  No other abnormalities noted.  I will have staff place the patient in a static finger splint in a position of function and I will refer him to Dr. Theophilus Czar with [facility] as she is a hand specialist.   Final Clinical Impressions(s) / UC Diagnoses   Final diagnoses:  Finger pain, left  Closed nondisplaced fracture of phalanx of left ring finger, unspecified phalanx, initial encounter     Discharge Instructions      Your x-rays shows  concerns for 2 breaks in your left ring finger.  Wear the splint we have applied to help protect your finger from further injury.  You may apply ice to finger for 20 minutes at a time, 2-3 times a day, to help with pain and swelling.  You may take over-the-counter Tylenol  and Aleve or ibuprofen  according to the package instructions as needed for pain.  Call make an appointment with Dr. Theophilus Czar with [facility], as she is a hand specialist.     ED Prescriptions   None    PDMP not reviewed this encounter.   Bernardino Ditch, NP 12/19/23 1233    Bernardino Ditch, NP 12/19/23 1301

## 2023-12-19 NOTE — ED Triage Notes (Signed)
 Patient states that he was in the shower, slipped and fell out of the shower around 8;30 pm last night.  Patient unsure if he hit his head.  Patient c/o lower back pain, left hip pain, and left 4th finger pain.  Patient has bruising and swelling in his left 4th finger.

## 2023-12-19 NOTE — Discharge Instructions (Signed)
 Your x-rays shows concerns for 2 breaks in your left ring finger.  Wear the splint we have applied to help protect your finger from further injury.  You may apply ice to finger for 20 minutes at a time, 2-3 times a day, to help with pain and swelling.  You may take over-the-counter Tylenol  and Aleve or ibuprofen  according to the package instructions as needed for pain.  Call make an appointment with Dr. Theophilus Czar with [facility], as she is a hand specialist.

## 2023-12-28 NOTE — Unmapped (Signed)
 30 day measurements reviewed.

## 2023-12-29 DIAGNOSIS — L732 Hidradenitis suppurativa: Principal | ICD-10-CM

## 2023-12-29 DIAGNOSIS — R52 Pain, unspecified: Principal | ICD-10-CM

## 2023-12-29 MED ORDER — NALOXONE 4 MG/ACTUATION NASAL SPRAY
NASAL | 1 refills | 0.00000 days | Status: CP
Start: 2023-12-29 — End: ?

## 2023-12-29 MED ORDER — OXYCODONE-ACETAMINOPHEN 5 MG-325 MG TABLET
ORAL_TABLET | Freq: Four times a day (QID) | ORAL | 0 refills | 3.00000 days | Status: CP | PRN
Start: 2023-12-29 — End: ?

## 2024-01-05 MED ORDER — COLCHICINE 0.6 MG CAPSULE
ORAL_CAPSULE | Freq: Every day | ORAL | 0 refills | 0.00000 days | Status: CP
Start: 2024-01-05 — End: 2025-01-03

## 2024-01-05 NOTE — Unmapped (Signed)
 Creatinine 0.92 on 03/05/2023 and was seen 11/27/2023.

## 2024-01-07 ENCOUNTER — Ambulatory Visit (INDEPENDENT_AMBULATORY_CARE_PROVIDER_SITE_OTHER): Admitting: Podiatry

## 2024-01-07 VITALS — Ht 71.0 in | Wt >= 6400 oz

## 2024-01-07 DIAGNOSIS — M722 Plantar fascial fibromatosis: Secondary | ICD-10-CM

## 2024-01-07 NOTE — Patient Instructions (Signed)

## 2024-01-07 NOTE — Progress Notes (Signed)
  Subjective:  Patient ID: Jason Davidson, male    DOB: [birth year],  MRN: [id number]  Chief Complaint  Patient presents with   Foot Pain    Rm 1 Patient is here as a f/u on plantar fascitis of the left foot. Patient states foot is swollen and sore, there is a current gout flare up of the left hallux. Patient feels PT was not successful and is seeking alternative treatments.    43 y.o. male presents with the above complaint. History confirmed with patient.  He has been discharged from therapy as there is no much else they could do for him.  Objective:  Physical Exam: warm, good capillary refill, no trophic changes or ulcerative lesions, normal DP and PT pulses, normal sensory exam, and pain on palpation of plantar medial heel at insertion of plantar fascia.   Radiographs: Previous x-rays taken December 24 showed no stress fracture or heel spur Assessment:   1. Plantar fasciitis of left foot      Plan:  Patient was evaluated and treated and all questions answered.  We again discussed further treatment options.  At this point with his minimal improvement I recommended an MRI to evaluate.  Surgery still not an open option at this point with his A1c being significantly elevated.  He would need to get to 7.5% or lower before surgery.  Surgical shoe dispensed to offload the plantar fascia and allow for soft tissue healing.  We also discussed the impact of his BMI and weight on the plantar fascia and difficulty in treating this with that.  I will see him back in the MRI and we will discuss further treatment options.  Return for after MRI to review.

## 2024-01-08 ENCOUNTER — Encounter: Admit: 2024-01-08 | Discharge: 2024-01-08 | Payer: Medicare (Managed Care) | Attending: Family | Primary: Family

## 2024-01-08 DIAGNOSIS — Z794 Long term (current) use of insulin: Principal | ICD-10-CM

## 2024-01-08 DIAGNOSIS — E7800 Pure hypercholesterolemia, unspecified: Principal | ICD-10-CM

## 2024-01-08 DIAGNOSIS — G8929 Other chronic pain: Principal | ICD-10-CM

## 2024-01-08 DIAGNOSIS — E1165 Type 2 diabetes mellitus with hyperglycemia: Principal | ICD-10-CM

## 2024-01-08 DIAGNOSIS — L732 Hidradenitis suppurativa: Principal | ICD-10-CM

## 2024-01-08 DIAGNOSIS — M1A9XX0 Chronic gout, unspecified, without tophus (tophi): Principal | ICD-10-CM

## 2024-01-08 DIAGNOSIS — M79672 Pain in left foot: Principal | ICD-10-CM

## 2024-01-08 DIAGNOSIS — G43909 Migraine, unspecified, not intractable, without status migrainosus: Principal | ICD-10-CM

## 2024-01-08 DIAGNOSIS — K219 Gastro-esophageal reflux disease without esophagitis: Principal | ICD-10-CM

## 2024-01-08 DIAGNOSIS — E039 Hypothyroidism, unspecified: Principal | ICD-10-CM

## 2024-01-08 LAB — ALBUMIN / CREATININE URINE RATIO
ALBUMIN QUANT URINE: 1.2 mg/dL
ALBUMIN/CREATININE RATIO: 5.3 ug/mg (ref 0.0–30.0)
CREATININE, URINE: 227.3 mg/dL

## 2024-01-08 MED ORDER — TRULICITY 4.5 MG/0.5 ML SUBCUTANEOUS PEN INJECTOR
SUBCUTANEOUS | 3 refills | 0.00000 days | Status: CP
Start: 2024-01-08 — End: ?

## 2024-01-08 MED ORDER — PREGABALIN 50 MG CAPSULE
ORAL_CAPSULE | Freq: Every evening | ORAL | 1 refills | 30.00000 days | Status: CP
Start: 2024-01-08 — End: 2025-01-07

## 2024-01-08 MED ORDER — TOPIRAMATE 50 MG TABLET
ORAL_TABLET | Freq: Two times a day (BID) | ORAL | 3 refills | 90.00000 days | Status: CP
Start: 2024-01-08 — End: 2025-01-07

## 2024-01-08 MED ORDER — FAMOTIDINE 20 MG TABLET
ORAL_TABLET | Freq: Two times a day (BID) | ORAL | 3 refills | 90.00000 days | Status: CP
Start: 2024-01-08 — End: 2025-01-07

## 2024-01-08 MED ORDER — LANTUS U-100 INSULIN 100 UNIT/ML SUBCUTANEOUS SOLUTION
Freq: Every evening | SUBCUTANEOUS | 3 refills | 91.00000 days | Status: CP
Start: 2024-01-08 — End: 2025-01-06

## 2024-01-08 MED ORDER — PROPRANOLOL ER 120 MG CAPSULE,24 HR,EXTENDED RELEASE
ORAL_CAPSULE | Freq: Every day | ORAL | 3 refills | 90.00000 days | Status: CP
Start: 2024-01-08 — End: 2025-01-07

## 2024-01-08 MED ORDER — LIDOCAINE 5 % TOPICAL OINTMENT
Freq: Three times a day (TID) | TOPICAL | 1 refills | 0.00000 days | Status: CP | PRN
Start: 2024-01-08 — End: 2025-01-07

## 2024-01-08 MED ORDER — ATORVASTATIN 20 MG TABLET
ORAL_TABLET | Freq: Every day | ORAL | 3 refills | 90.00000 days | Status: CP
Start: 2024-01-08 — End: 2025-01-07

## 2024-01-08 MED ORDER — LEVOTHYROXINE 50 MCG TABLET
ORAL_TABLET | Freq: Every day | ORAL | 3 refills | 90.00000 days | Status: CP
Start: 2024-01-08 — End: 2025-01-07
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
## 2024-01-10 ENCOUNTER — Ambulatory Visit
Admission: RE | Admit: 2024-01-10 | Discharge: 2024-01-10 | Disposition: A | Discharge status: Home / Self Care | Source: Ambulatory Visit | Attending: Podiatry

## 2024-01-10 DIAGNOSIS — M722 Plantar fascial fibromatosis: Secondary | ICD-10-CM

## 2024-01-12 ENCOUNTER — Encounter: Payer: Self-pay | Admitting: Podiatry

## 2024-01-12 ENCOUNTER — Ambulatory Visit (INDEPENDENT_AMBULATORY_CARE_PROVIDER_SITE_OTHER): Admitting: Podiatry

## 2024-01-12 DIAGNOSIS — B353 Tinea pedis: Secondary | ICD-10-CM | POA: Diagnosis not present

## 2024-01-12 DIAGNOSIS — E1142 Type 2 diabetes mellitus with diabetic polyneuropathy: Secondary | ICD-10-CM

## 2024-01-12 DIAGNOSIS — M79676 Pain in unspecified toe(s): Secondary | ICD-10-CM

## 2024-01-12 DIAGNOSIS — B351 Tinea unguium: Secondary | ICD-10-CM | POA: Diagnosis not present

## 2024-01-12 DIAGNOSIS — L600 Ingrowing nail: Secondary | ICD-10-CM

## 2024-01-12 MED ORDER — KETOCONAZOLE 2 % EX CREA: TOPICAL_CREAM | Freq: Every day | CUTANEOUS | 1 refills | Status: DC

## 2024-01-12 NOTE — Progress Notes (Signed)
 Subjective:  Patient ID: Jason Davidson, male    DOB: July 20, 1980,  MRN: [id number]  Jason Davidson presents to clinic today for at risk foot care with history of diabetic neuropathy and painful elongated overgrown toenails which are tender when wearing enclosed shoe gear. Patient states his right great toe is tender at the medial border. He bumped his toe at church. He is still seeing Dr. Silva for plantar fasciitis and is awaiting results of MRI. Lastly, patient notes skin eruption of right foot which is itching at times. Chief Complaint  Patient presents with   dfc    Pt is here to get nails trimmed down   PCP Levorn Snide, FNP Pt stated that there has been no changes since last visit     PCP is Snide Levorn ORN, NP. ARNETTA 01/08/2024.  Allergies  Allergen Reactions   Penicillins Swelling and Hives   Sulfa Antibiotics Nausea And Vomiting   Erythromycin Nausea And Vomiting   Metronidazole  Nausea And Vomiting   Review of Systems: Negative except as noted in the HPI.  Objective: No changes noted in today's physical examination. There were no vitals filed for this visit. Jason Davidson is a pleasant 43 y.o. male morbidly obese in NAD. AAO x 3.  Vascular Examination: Capillary refill time <3 seconds b/l LE. Lymphedema b/l LE. Palpable pedal pulses b/l LE. Digital hair sparse b/l. Skin temperature gradient WNL b/l. No varicosities b/l.  Dermatological Examination: Pedal skin with normal turgor and tone b/l. No open wounds. No interdigital macerations b/l. Toenails left foot and 2-5 b/l thickened, discolored, dystrophic with subungual debris. There is pain on palpation to dorsal aspect of nailplates.   Incurvated nailplate right great toe medial  border(s) with tenderness to palpation. No erythema, no edema, no drainage noted.   Neurological Examination: Protective sensation intact with 10 gram monofilament b/l LE. Vibratory sensation intact b/l LE. Pt has subjective  symptoms of neuropathy.  Musculoskeletal Examination: Normal muscle strength 5/5 to all lower extremity muscle groups bilaterally. No pain, crepitus or joint limitation noted with ROM b/l LE. No gross bony pedal deformities b/l. Patient ambulates independently without assistive aids.  Assessment/Plan: 1. Pain due to onychomycosis of toenail   2. Tinea pedis, right   3. Ingrown toenail without infection   4. Diabetic polyneuropathy associated with type 2 diabetes mellitus (HCC)     Meds ordered this encounter  Medications   ketoconazole  (NIZORAL ) 2 % cream    Sig: Apply topically daily.    Dispense:  30 g    Refill:  1   -Patient was evaluated today. All questions/concerns addressed on today's visit. He is seeing Dr. Silva for plantar fasciitis of LLE. -Continue foot and shoe inspections daily. Monitor blood glucose per PCP/Endocrinologist's recommendations. -Patient to continue soft, supportive shoe gear daily. -Toenails were debrided in length and girth 2-5 bilaterally and left great toe with sterile nail nippers and dremel without iatrogenic bleeding.  -No invasive procedure(s) performed. Offending nail border debrided and curretaged medial border right hallux utilizing sterile nail nipper and currette. Border(s) cleansed with alcohol. Patient/POA/Caregiver/Facility instructed to apply Neosporin Cream to right great toe once daily for 7 days. Call office if there are any concerns. -Discussed tinea pedis infection. To prevent re-infection of tinea pedis, patient/POA/caregiver instructed to spray shoes with Lysol every evening and clean tub/shower with bleach based cleanser. Rx sent to pharmacy for Ketoconazole  Cream 2% to be applied to both feet and between toes once daily for six weeks.  Return in about 3 months (around 04/13/2024).  Jason Davidson, DPM      [place] LOCATION: [address] [place] [address]                    Office [phone]   [facility] LOCATION: [address] [place], [place] [address] Office [phone]

## 2024-01-12 NOTE — Patient Instructions (Signed)
 To prevent reinfection, spray shoes with lysol every evening.  Clean tub or shower with bleach based cleanser.  Athlete's Foot Athlete's foot (tinea pedis) is a fungal infection of the skin on your feet. It often occurs on the skin that is between or underneath the toes. It can also occur on the soles of your feet. The infection can spread from person to person (is contagious). It can also spread when a person's bare feet come in contact with the fungus on shower floors or on items such as shoes. What are the causes? This condition is caused by a fungus that grows in warm, moist places. You can get athlete's foot by sharing shoes, shower stalls, towels, and wet floors with someone who is infected. Not washing your feet or changing your socks often enough can also lead to athlete's foot. What increases the risk? This condition is more likely to develop in: Men. People who have a weak body defense system (immune system). People who have diabetes. People who use public showers, such as at a gym. People who wear heavy-duty shoes, such as industrial or military shoes. Seasons with warm, humid weather. What are the signs or symptoms? Symptoms of this condition include: Itchy areas between your toes or on the soles of your feet. White, flaky, or scaly areas between your toes or on the soles of your feet. Very itchy small blisters between your toes or on the soles of your feet. Small cuts in your skin. These cuts can become infected. Thick or discolored toenails. How is this diagnosed? This condition may be diagnosed with a physical exam and a review of your medical history. Your health care provider may also take a skin or toenail sample to examine under a microscope. How is this treated? This condition is treated with antifungal medicines. These may be applied as powders, ointments, or creams. In severe cases, an oral antifungal medicine may be given. Follow these instructions at  home: Medicines Apply or take over-the-counter and prescription medicines only as told by your health care provider. Apply your antifungal medicine as told by your health care provider. Do not stop using the antifungal even if your condition improves. Foot care Do not scratch your feet. Keep your feet dry: Wear cotton or wool socks. Change your socks every day or if they become wet. Wear shoes that allow air to flow, such as sandals or canvas tennis shoes. Wash and dry your feet, including the area between your toes. Also, wash and dry your feet: Every day or as told by your health care provider. After exercising. General instructions Do not let others use towels, shoes, nail clippers, or other personal items that touch your feet. Protect your feet by wearing sandals in wet areas, such as locker rooms and shared showers. Keep all follow-up visits. This is important. If you have diabetes, keep your blood sugar under control. Contact a health care provider if: You have a fever. You have swelling, soreness, warmth, or redness in your foot. Your feet are not getting better with treatment. Your symptoms get worse. You have new symptoms. You have severe pain. Summary Athlete's foot (tinea pedis) is a fungal infection of the skin on your feet. It often occurs on skin that is between or underneath the toes. This condition is caused by a fungus that grows in warm, moist places. Symptoms include white, flaky, or scaly areas between your toes or on the soles of your feet. This condition is treated with antifungal medicines.   Keep your feet clean. Always dry them thoroughly. This information is not intended to replace advice given to you by your health care provider. Make sure you discuss any questions you have with your health care provider. Document Revised: 09/17/2020 Document Reviewed: 09/17/2020 Elsevier Patient Education  2024 [employer]  

## 2024-01-14 NOTE — Unmapped (Signed)
 [facility] Refill Coordination Note    Specialty Medication(s) to be Shipped:   Inflammatory Disorders: Humira     Other medication(s) to be shipped: No additional medications requested for fill at this time     Logan Quinn., DOB: 1981/04/05  Phone: There are no phone numbers on file.      All above HIPAA information was verified with patient.     Was a Nurse, learning disability used for this call? No    Completed refill call assessment today to schedule patient's medication shipment from the [name]  [phone]).  All relevant notes have been reviewed.     Specialty medication(s) and dose(s) confirmed: Regimen is correct and unchanged.   Changes to medications: Logan Quinn reports no changes at this time.  Changes to insurance: No  New side effects reported not previously addressed with a pharmacist or physician: None reported  Questions for the pharmacist: No    Confirmed patient received a Conservation officer, historic buildings and a Surveyor, mining with first shipment. The patient will receive a drug information handout for each medication shipped and additional FDA Medication Guides as required.       DISEASE/MEDICATION-SPECIFIC INFORMATION        For patients on injectable medications: Patient currently has 0 doses left.  Next injection is scheduled for 8/8.    SPECIALTY MEDICATION ADHERENCE     Medication Adherence    Patient reported X missed doses in the last month: 0  Specialty Medication: secukinumab  (COSENTYX  UNOREADY PEN) 300 mg/2 mL PnIj  Patient is on additional specialty medications: No  Informant: patient              Were doses missed due to medication being on hold? No    Humira  80/0.8 mg/ml: 0 doses of medicine on hand     REFERRAL TO PHARMACIST     Referral to the pharmacist: Not needed      [facility]     Shipping address confirmed in Epic.       Delivery Scheduled: Yes, Expected medication delivery date: 8/7.     Medication will be delivered via Same Day Courier to the prescription address in Epic WAM.    Logan Quinn [facility]

## 2024-01-15 MED FILL — COSENTYX UNOREADY PEN 300 MG/2 ML SUBCUTANEOUS PEN INJECTOR: SUBCUTANEOUS | 28 days supply | Qty: 2 | Fill #2

## 2024-01-16 ENCOUNTER — Ambulatory Visit
Admit: 2024-01-16 | Discharge: 2024-01-17 | Payer: Medicare (Managed Care) | Attending: Physician Assistant | Primary: Physician Assistant

## 2024-01-16 DIAGNOSIS — R682 Dry mouth, unspecified: Principal | ICD-10-CM

## 2024-01-16 DIAGNOSIS — F458 Other somatoform disorders: Principal | ICD-10-CM

## 2024-01-16 DIAGNOSIS — G4733 Obstructive sleep apnea (adult) (pediatric): Principal | ICD-10-CM

## 2024-01-16 NOTE — Unmapped (Signed)
 Sleep apnea: I am sending a prescription to continue with your CPAP supplies.  I am also requesting online access to your CPAP machine.    Aerophagia: This is what happens when you swallowing air.  To prevent this avoidance of food and fluids within 3 hours of your bedtime are necessary.  In addition, elevate the head of bed.  It is best to use a wedge pillow.  Aggressively treat any reflux symptoms.  Try to avoid unnecessary nighttime medications including your allopurinol , and Singulair .

## 2024-01-16 NOTE — Unmapped (Signed)
 Date of Service: 01/16/2024     Patient Name: Logan Quinn.       MRN: [id number]       Date of Birth: 14-Oct-1980  Primary Care Physician: Geralene Levorn Geralds, FNP  Referring Provider: Geralene Levorn Geralds, *  DME: [facility]    Assessment and Plan:   Impression:   Logan Scholle. is a 43 y.o. male seen for the following:    Severe obstructive sleep apnea on CPAP: Logan Quinn was initially diagnosed with severe obstructive sleep apnea via PSG that was completed on 09/17/2010 with an AHI of 51.3 /HR.  His last diagnostic study was a split study that was completed on 02/26/2017.  This again noted severe obstructive sleep apnea with an AHI of 50 /HR.  He is doing well on CPAP.  He remains on the recommended setting of 16 cm H2O EPR 2 and is using a loaner device from [facility] as his device was not functioning adequately.  He uses the ResMed AirFit F20 fullface mask and does not snore when on his device.  Logan Quinn continues to have relatively good sleep quality.  While he does fall asleep during the day, this is when he is laying down with his son, for his son to take a nap.  His complaints are dry mouth and bloating.    Aerophagia: Logan Quinn has complaints of bloating when he wakes up.  He would benefit from avoidance of foods/fluids within 3 hours of his bedtime and aggressive treatment of his reflux.  He should elevate his head of bed preferably with a wedge pillow.    Dry mouth/dry nose: Unfortunately, Logan Quinn tends to run out of water in his water chamber before he is due to wake up.  Increasing humidification will not be helpful.  However, he may benefit from use of a room humidifier.    I personally spent 40 minutes face-to-face and non-face-to-face in the care of this patient, which includes all pre, intra, and post visit time on the date of service.      Subjective:   CC: Follow-up    History of Present Illness  Logan Quinn comes in with his wife and children for follow-up. Because of loud snoring, witnessed pauses in his sleep breathing during sleep, waking up gasping for air, having fragmented and non-restorative sleep, as well as daytime somnolence, morning headaches, and morbid obesity, Logan Quinn was sent for a PSG completed on 09/17/2010, revealing severe obstructive sleep apnea with an AHI of 51.3/hr. His last diagnostic study was a split study completed on 02/26/2017, again noting severe obstructive sleep apnea with an AHI of 50/hr. Recommendations were made for CPAP at 16 cm H2O, EPR 2. He still has his loaner CPAP device from [facility] because his previous device was not working adequately. He is using the ResMed AirFit F20 full-face mask. He has no complaints with PAP therapy and uses his device nightly. He does have complaints of some bloating as well as dry mouth. He also complains that the water chamber tends to run out before he is ready to wake up, and the ensuing dryness leads to his waking up. He does not seem to snore on his device and continues to benefit from improved sleep quality.    Concerning sleep, he has been dealing with gout that has been problematic lately, and this has been affecting his sleep. His bedtime is about 10:00 PM, and he usually falls asleep quickly. He does occasionally wake up.  This may be up to 2 or 3 times a night to urinate. He rises for the day between 6:00 AM-8:00 AM. This depends on whether the kids are in school or not, and as they are out of school for now, he tends to wake up around 8:00 AM. While Logan Quinn can fall asleep easily during the day, he does not until he puts his son to sleep for a nap. His caffeine use is rare.    Review of Systems:  A 14-system review was completed and found to be negative except for what is mentioned in the HPI.     PMH:  Past Medical History[1]    Past Surgical Hx:  Past Surgical History[2]    FamHx:  Family History[3]    Social History:  Social History [4]    Medications:  Current Medications[5]    Allergies:  Allergies[6]         Objective:     Physical Exam:     Vitals:    01/16/24 1330   Weight: (!) 220 kg (485 lb)   Height: 180.3 cm (5' 11)     Body mass index is 67.64 kg/m??.      Awake alert appropriate  Face symmetric  Clear speech  Follows commands  Moves all extremities spontaneously and antigravity  Antalgic gait      Epworth Sleepiness Scale: 9      Data/ Lab/ Inventory Review:   Polysomnogram results as above  History obtained from patient  Imaging   Blood/ serum laboratory tests  No results found for: CBC  Ferritin   Date Value Ref Range Status   01/15/2022 125.0 10.5 - 307.3 ng/mL Final     Iron Saturation (%)   Date Value Ref Range Status   01/15/2022 23 % Final     TSH   Date Value Ref Range Status   03/05/2023 1.937 0.550 - 4.780 uIU/mL Final     Free T4   Date Value Ref Range Status   07/08/2018 0.94 0.71 - 1.40 ng/dL Final     Vitamin B-12   Date Value Ref Range Status   01/15/2022 347 211 - 911 pg/ml Final     CO2   Date Value Ref Range Status   03/05/2023 25.0 20.0 - 31.0 mmol/L Final           Author:  Manus Notch, PA 01/16/2024 1:58 PM    Note - This record has been created using [employer]. Chart creation errors have been sought, but may not always have been located. Such creation errors do not reflect on the standard of medical care.         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    Liver disease     Migraines     Mild intermittent asthma without complication (HHS-HCC) 10/11/2021    Morbid obesity with BMI of 60.0-69.9, adult (CMS-HCC)     Neuropathy in diabetes  Obstructive sleep apnea     OSA on CPAP     Retinal detachment 04/2014    Severe obstructive sleep apnea     Trapezius muscle strain 12/07/2013    Urinary incontinence, nocturnal enuresis     Venous insufficiency    [2]   Past Surgical History:  Procedure Laterality Date    COLONOSCOPY      CYSTOSCOPY      EYE SURGERY  04/2014    LASER ABLATION INCOMPETENT VEIN INI [facility] HISTORICAL RESULT) Right     PR COLONOSCOPY FLX DX W/COLLJ SPEC WHEN PFRMD N/A 03/24/2013    Procedure: COLONOSCOPY, FLEXIBLE, PROXIMAL TO SPLENIC FLEXURE; DIAGNOSTIC, W/WO COLLECTION SPECIMEN BY BRUSH OR WASH;  Surgeon: Chauncey GORMAN Pfeiffer, MD;  Location: [facility];  Service: Gastroenterology    PR EYE SURG POST SGMT PROC UNLISTED Left     pneumatic retinopexy OS    PR UPPER GI ENDOSCOPY,DIAGNOSIS N/A 02/02/2013    Procedure: UGI ENDO, INCLUDE ESOPHAGUS, STOMACH, & DUODENUM &/OR JEJUNUM; DX W/WO COLLECTION SPECIMN, BY BRUSH OR WASH;  Surgeon: Olam CHRISTELLA Henle, MD;  Location: GI PROCEDURES [facility];  Service: Gastroenterology    SKIN BIOPSY      TOOTH EXTRACTION  10/06/2023    UPPER GASTROINTESTINAL ENDOSCOPY      US  PYLORIC STENOSIS ([place] HISTORICAL RESULT)     [3]   Family History  Problem Relation Age of Onset    Cancer Maternal Grandfather         Stomach Cancer    Hearing loss Maternal Grandfather     GER disease Maternal Grandfather     Cancer Paternal Grandfather         Bone Cancer, Lung Cancer    COPD Paternal Grandmother     Arthritis Paternal Grandmother     Depression Paternal Grandmother     Osteoporosis Paternal Grandmother     Diabetes Mother     Heart disease Mother     Migraines Mother     Arthritis Mother     Depression Mother     GER disease Mother     Hypertension Mother     Angina Mother     COPD Mother     Glaucoma Mother     Hearing loss Mother     Cataracts Mother     Stroke Mother     Miscarriages / [place] Mother     Diabetes Sister     Migraines Sister     Asthma Sister     Depression Sister     Hearing loss Sister     Diabetes Brother     Asthma Brother     Diabetes Maternal Grandmother     Heart disease Maternal Grandmother     Migraines Maternal Grandmother     Depression Maternal Grandmother     Angina Maternal Grandmother     Hypertension Maternal Grandmother     Stroke Maternal Grandmother     GER disease Maternal Grandmother     Diabetes Maternal Uncle     Hearing loss Maternal Uncle     Diabetes Maternal Uncle         resulted in need for kidney transplant    Liver disease Maternal Uncle     Kidney disease Maternal Uncle         needed kidney transplant    Depression Maternal Uncle     Asthma Brother     Gout Father     No Known Problems Paternal  Aunt     No Known Problems Paternal Uncle     No Known Problems Other     Diabetes Sister     Asthma Sister     Depression Sister     Hearing loss Sister     Migraines Sister     Diabetes Brother     Asthma Brother     Broken bones Brother     Diabetes Maternal Uncle     Hearing loss Maternal Uncle     Asthma Brother     Colorectal Cancer Neg Hx     Esophageal cancer Neg Hx     Liver cancer Neg Hx     Pancreatic cancer Neg Hx     Stomach cancer Neg Hx     Amblyopia Neg Hx     Blindness Neg Hx     Retinal detachment Neg Hx     Strabismus Neg Hx     Macular degeneration Neg Hx     Anesthesia problems Neg Hx     Clotting disorder Neg Hx     Collagen disease Neg Hx     Dislocations Neg Hx     Fibromyalgia Neg Hx     Hemophilia Neg Hx     Rheumatologic disease Neg Hx     Scoliosis Neg Hx     Severe sprains Neg Hx     Sickle cell anemia Neg Hx     Spinal Compression Fracture Neg Hx     Melanoma Neg Hx     Basal cell carcinoma Neg Hx     Squamous cell carcinoma Neg Hx     Deep vein thrombosis Neg Hx     Thyroid disease Neg Hx    [4]   Social History  Socioeconomic History    Marital status: Married   Tobacco Use    Smoking status: Never     Passive exposure: Never    Smokeless tobacco: Never   Vaping Use    Vaping status: Never Used   Substance and Sexual Activity    Alcohol use: Never     Comment: rare social    Drug use: Never    Sexual activity: Yes     Partners: Female     Birth control/protection: None   Other Topics Concern    Do you use sunscreen? Yes    Tanning bed use? No    Are you easily burned? Yes    Excessive sun exposure? No    Blistering sunburns? No     Social Drivers of Psychologist, prison and probation services Strain: Low Risk  (01/01/2024)    Overall Financial Resource Strain (CARDIA)     Difficulty of Paying Living Expenses: Not hard at all   Food Insecurity: No Food Insecurity (01/01/2024)    Hunger Vital Sign     Worried About Running Out of Food in the Last Year: Never true     Ran Out of Food in the Last Year: Never true   Transportation Needs: No Transportation Needs (01/01/2024)    PRAPARE - Therapist, art (Medical): No     Lack of Transportation (Non-Medical): No    Received from [employer]    Social Network   Housing: Low Risk  (01/01/2024)    Housing     Within the past 12 months, have you ever stayed: outside, in a car, in a tent, in an overnight shelter, or temporarily in someone else's home (i.e.  couch-surfing)?: No     Are you worried about losing your housing?: No   [5]   Current Outpatient Medications:     allopurinol  (ZYLOPRIM ) 300 MG tablet, Take 1 tablet (300 mg total) by mouth daily., Disp: 90 tablet, Rfl: 3    ALPRAZolam  (XANAX ) 0.5 MG tablet, Takes 1 tablet nightly and 1 tablet a day when needed, Disp: , Rfl:     anastrozole  (ARIMIDEX ) 1 mg tablet, Take 1 tablet (1 mg total) by mouth daily., Disp: 30 tablet, Rfl: 11    ascorbic acid (VITAMIN C ORAL), Take 1 capsule by mouth nightly., Disp: , Rfl:     atorvastatin  (LIPITOR) 20 MG tablet, Take 1 tablet (20 mg total) by mouth daily., Disp: 90 tablet, Rfl: 3    blood sugar diagnostic (ACCU-CHEK GUIDE TEST STRIPS) Strp, by Other route Three (3) times a day before meals., Disp: 300 strip, Rfl: 3    blood-glucose meter kit, Use as directed, Disp: 1 each, Rfl: 0    budesonide -formoterol  (SYMBICORT ) 80-4.5 mcg/actuation inhaler, Inhale 2 puffs two (2) times a day., Disp: 10.2 g, Rfl: 0    clindamycin  (CLEOCIN  T) 1 % lotion, Apply topically two (2) times a day. To affected areas on body as needed for flares, Disp: 60 mL, Rfl: 3    clindamycin  (CLEOCIN ) 150 MG capsule, Take 1 capsule (150 mg total) by mouth Three (3) times a day., Disp: , Rfl:     clobetasol  (TEMOVATE ) 0.05 % ointment, Apply to HS lesions twice daily for 5-7 days as needed for flares, Disp: 60 g, Rfl: 1    clotrimazole  (LOTRIMIN ) 1 % cream, Apply topically two (2) times a day., Disp: 60 g, Rfl: 1    colchicine  (MITIGARE ) 0.6 mg cap capsule, Take 1 capsule (0.6 mg total) by mouth daily., Disp: 90 capsule, Rfl: 0    cyclobenzaprine  (FLEXERIL ) 10 MG tablet, Take 1 tablet (10 mg total) by mouth Three (3) times a day as needed for muscle spasms., Disp: 60 tablet, Rfl: 1    dextroamphetamine  sulfate (DEXTROSTAT ) 10 MG tablet, Take 3 tablets (30 mg total) by mouth two (2) times a day., Disp: , Rfl:     dicyclomine  (BENTYL ) 10 mg capsule, Take 1 capsule (10 mg total) by mouth Four (4) times a day (before meals and nightly)., Disp: 120 capsule, Rfl: 1    dulaglutide  (TRULICITY ) 4.5 mg/0.5 mL PnIj, Inject 4.5 mg under the skin every seven (7) days., Disp: 6 mL, Rfl: 3    DULoxetine  (CYMBALTA ) 60 MG capsule, Take 1 capsule (60 mg total) by mouth Two (2) times a day., Disp: 180 capsule, Rfl: 0    empty container Misc, Use as directed to dispose of Cosentyx  pens., Disp: 1 each, Rfl: 2    erenumab -aooe (AIMOVIG  AUTOINJECTOR) auto-injector, Inject 1 Pen under the skin every thirty (30) days., Disp: 3 mL, Rfl: 3    famotidine  (PEPCID ) 20 MG tablet, Take 1 tablet (20 mg total) by mouth two (2) times a day., Disp: 180 tablet, Rfl: 3    fluocinonide (LIDEX) 0.05 % gel, Apply 1 Application topically Three (3) times a day., Disp: , Rfl:     fluoride, sodium, 1.1 % Pste, , Disp: , Rfl:     fluticasone  propionate (FLONASE ) 50 mcg/actuation nasal spray, 1 spray into each nostril two (2) times a day., Disp: 16 g, Rfl: 11 glipiZIDE  (GLUCOTROL ) 10 MG tablet, Take 2 tablets (20 mg total) by mouth Two (2) times a day (30 minutes before a  meal)., Disp: 360 tablet, Rfl: 3    HUMALOG  U-100 INSULIN  100 unit/mL injection, INJECT 120 UNITS UNDER THE SKIN DAILY VIA OMNIPOD, Disp: 100 mL, Rfl: 3    hydrOXYzine  (ATARAX ) 25 MG tablet, Take 1 tablet (25 mg total) by mouth Three (3) times a day as needed. May take two before bed for sleep., Disp: 120 tablet, Rfl: 1    ibuprofen  (ADVIL ,MOTRIN ) 800 MG tablet, Take 1 tablet (800 mg total) by mouth every eight (8) hours as needed., Disp: 90 tablet, Rfl: 1    insulin  glargine (LANTUS  U-100 INSULIN ) 100 unit/mL injection, Inject 0.24 mL (24 Units total) under the skin nightly., Disp: 22 mL, Rfl: 3    insulin  syringe-needle U-100 (BD INSULIN  SYRINGE ULTRA-FINE) 1 mL 31 gauge x 5/16 (8 mm) Syrg, Once daily for insulin  dosing., Disp: 100 each, Rfl: 3    insulin  syringe-needle U-100 1 mL 31 gauge x 5/16 (8 mm) Syrg, Once daily for insulin  dosing., Disp: 100 each, Rfl: 3    lancets (ACCU-CHEK FASTCLIX LANCET DRUM) Misc, Use to check blood sugars 3 times a day before meals or as directed, Disp: 200 each, Rfl: 5    levothyroxine  (SYNTHROID ) 50 MCG tablet, Take 1 tablet (50 mcg total) by mouth daily., Disp: 90 tablet, Rfl: 3    lidocaine  (XYLOCAINE ) 5 % ointment, Apply topically Three (3) times a day as needed., Disp: 30 g, Rfl: 1    losartan  (COZAAR ) 50 MG tablet, Take 1.5 tablets (75 mg total) by mouth daily., Disp: 135 tablet, Rfl: 3    montelukast  (SINGULAIR ) 10 mg tablet, Take 1 tablet (10 mg total) by mouth daily., Disp: 90 tablet, Rfl: 3    multivitamin (MULTIPLE VITAMIN ORAL), Take 1 tablet by mouth daily., Disp: , Rfl:     naproxen  (NAPROSYN ) 500 MG tablet, Take 1 tablet (500 mg total) by mouth in the morning and 1 tablet (500 mg total) in the evening. Take with meals., Disp: 30 tablet, Rfl: 0    OMNIPOD 5 PACK PODS, Omnipod 5 G6/G7 system POD (Gen 5) (5 PODS) Change POD every 48 hours, Disp: 3 each, Rfl: 11    ondansetron  (ZOFRAN ) 4 MG tablet, Take 1 tablet (4 mg total) by mouth every eight (8) hours as needed for nausea., Disp: 30 tablet, Rfl: 1    oxybutynin  (DITROPAN ) 5 MG tablet, Take 1 tablet (5 mg total) by mouth two (2) times a day., Disp: 180 tablet, Rfl: 1    pregabalin  (LYRICA ) 50 MG capsule, Take 1 capsule (50 mg total) by mouth at bedtime., Disp: 30 capsule, Rfl: 1    propranolol  (INDERAL  LA) 120 mg 24 hr capsule, Take 1 capsule (120 mg total) by mouth daily., Disp: 90 capsule, Rfl: 3    ramelteon (ROZEREM) 8 mg tablet, Take 1 tablet (8 mg total) by mouth nightly., Disp: , Rfl:     safety needles (BD SAFETYGLIDE NEEDLE) 18 gauge x 1 1/2 Ndle, Inject 1 each into the muscle once a week., Disp: 50 each, Rfl: 0    secukinumab  (COSENTYX  UNOREADY PEN) 300 mg/2 mL PnIj, Inject the contents of 1 pen (300 mg) under the skin every 28 days, Disp: 2 mL, Rfl: 11    secukinumab  (COSENTYX  UNOREADY PEN) 300 mg/2 mL PnIj, Inject the contents of 1 pen (300 mg) under the skin once weekly for 5 weeks (on weeks 0,1,2,3,4) for loading dose., Disp: 10 mL, Rfl: 0    sour cherry extract (TART CHERRY EXTRACT) 1,000 mg cap, , Disp: ,  Rfl:     syringe with needle (BD LUER-LOK SYRINGE) 3 mL 21 gauge x 1 1/2 Syrg, To draw up testosterone  once weekly, Disp: 100 each, Rfl: 0    tadalafil  (CIALIS ) 5 MG tablet, Take 1 tablet (5 mg total) by mouth in the morning., Disp: 90 tablet, Rfl: 3    testosterone  cypionate (DEPOTESTOTERONE CYPIONATE) 200 mg/mL injection, Inject 0.4 mL (80 mg total) into the muscle once a week. Inject 0.4cc (80mg ) weekly. Discard after each use, Disp: 4 mL, Rfl: 5    topiramate  (TOPAMAX ) 50 MG tablet, Take 1.5 tablets (75 mg total) by mouth two (2) times a day., Disp: 270 tablet, Rfl: 3    WELLBUTRIN XL 150 mg 24 hr tablet, Take 1 tablet (150 mg total) by mouth every morning., Disp: , Rfl:     miconazole  (MICATIN) 2 % cream, Apply to affected area 2 times daily (Patient not taking: Reported on 01/16/2024), Disp: 60 g, Rfl: 1    naloxone  (NARCAN ) 4 mg nasal spray, IF NEEDED FOR OVERDOSE: One spray in either nostril once for known/suspected opioid overdose. May repeat every 2-3 minutes in alternating nostril til EMS arrives (Patient not taking: Reported on 01/16/2024), Disp: 2 each, Rfl: 1  [6]   Allergies  Allergen Reactions    Erythromycin Nausea And Vomiting     Other reaction(s): Vomiting    Penicillins Rash, Swelling and Hives    Sulfa (Sulfonamide Antibiotics) Nausea And Vomiting and Nausea Only    Other     Metronidazole Nausea And Vomiting

## 2024-01-20 NOTE — Unmapped (Signed)
 Dermatology Note     Assessment and Plan:      Hidradenitis suppurativa, Hurley stage II, chronic,flaring  Previously tried: doxycyline, humira  80 mg weekly, prednisone , intra lesional kenalog    - Diagnosis, treatment options, prognosis, risk/ benefit, and side effects of treatment were discussed with the patient.   - s/p unroofing of right scrotum 2020  - Stop secukinumab  300 mg/2 mL PnIj; Inject the contents of 1 pen (300 mg) under the skin once every 28 days   - Continue clindamycin  1% lotion topically nightly to the flaring lesions  - Continue triamcinolone  0.1% ointment; Apply to affected area twice daily until improved or for up to 2 weeks, whichever is sooner. Break for 1-2 weeks. Restart as needed.   - Start infliximab 10mg /kg week 0 ,2, 6, then q 8 weeks.  Through [employer].   - Intra lesional kenalog  as below  - Prednisone  on reserve (would dose at 20 mg x5, 15 mg x5, 10 mg x5, 5 mg x5 due to  history of elevated blood glucose)    Intralesional Kenalog  Procedure Note: After the patient was informed of risks (including atrophy and dyspigmentation), benefits and side effects of intralesional steroid injection, the patient elected to undergo injection and verbal consent was obtained. Skin was cleaned with alcohol and injected intralesionally into the sites (below). The patient tolerated the procedure well without complications and was instructed on post-procedure care.  Location(s): right mons, left mons  Number of sites treated: 2  Kenalog  (triamcinolone ) Concentration: 40 mg/ml   Volume: 1 ml total       High Risk Medication Use (Cosentyx )  - No history of IBD  - Quant gold neg 07/2023  - Hep serologies acceptable from 2019    The patient was advised to call for an appointment should any new, changing, or symptomatic lesions develop.     RTC: Return in about 3 months (around 04/22/2024). or sooner as needed   _________________________________________________________________      Chief Complaint Chief Complaint   Patient presents with    Skin Problem     Pt is here for a follow-up for HS. Pt reports no medication have been helping.       HPI     Logan Olden. is a 43 y.o. male who presents as a returning patient (last seen 10/31/2023) to Dermatology for follow up of HS.     History of Present Illness  Logan D Hosey Burmester. is a 43 year old male who presents with ongoing skin flares despite Cosentyx  treatment.    He continues to experience skin flares despite being on Cosentyx , describing the medication as ineffective. Previously, he was on Humira , which he found more effective, though not optimal. The flares are primarily in the inguinal area. Injections provide relief for a couple of days.    He is currently only on Cosentyx  and has not been using any other medications. He has previously been prescribed Percocet for pain management, but found it unsatisfactory. His primary care provider is concerned about steroid use due to blood sugar levels, and he has been on higher doses of prednisone  in the past.          The patient denies any other new or changing lesions or areas of concern.     Pertinent Past Medical History     Past Medical History, Family History, Social History, Medication List, Allergies, and Problem List were reviewed in the rooming section of Epic.  ROS: Other than symptoms mentioned in the HPI, no fevers, chills, or other skin complaints    Physical Examination     GENERAL: Well-appearing male in no acute distress, resting comfortably.  NEURO: Alert and oriented, answers questions appropriately  PSYCH: Normal mood and affect  SKIN: Examination of the genitalia and groin was performed  - draining sinuses of the mons pubis      All areas not commented on are within normal limits or unremarkable.           (Approved Template 02/21/2020)

## 2024-01-21 DIAGNOSIS — L732 Hidradenitis suppurativa: Principal | ICD-10-CM

## 2024-01-21 DIAGNOSIS — Z79899 Other long term (current) drug therapy: Principal | ICD-10-CM

## 2024-01-21 NOTE — Unmapped (Signed)
 CPAP compliance and therapy notes:  DME: [facility]  Device: ResMed AirSense 11 AutoSet  Set up date: N/A **loaner CPAP  Date: 10/23/2023 - 01/20/2024: 90 days  >4 hours usage: 99%  Average usage on days used: 7 hours 33 minutes  Setting: CPAP 16 cm H2O EPR 2  AHI: 0.6

## 2024-01-26 ENCOUNTER — Ambulatory Visit: Admitting: Podiatry

## 2024-01-28 ENCOUNTER — Ambulatory Visit: Admitting: Podiatry

## 2024-01-30 NOTE — Unmapped (Signed)
 Confirmed appt on 9.26 in infusion at [facility] with patient verbally.

## 2024-02-06 NOTE — Unmapped (Signed)
Data reviewed

## 2024-02-11 ENCOUNTER — Ambulatory Visit (INDEPENDENT_AMBULATORY_CARE_PROVIDER_SITE_OTHER): Admitting: Podiatry

## 2024-02-11 DIAGNOSIS — N3281 Overactive bladder: Principal | ICD-10-CM

## 2024-02-11 DIAGNOSIS — N3944 Nocturnal enuresis: Principal | ICD-10-CM

## 2024-02-11 DIAGNOSIS — M79672 Pain in left foot: Principal | ICD-10-CM

## 2024-02-11 DIAGNOSIS — G8929 Other chronic pain: Principal | ICD-10-CM

## 2024-02-11 DIAGNOSIS — S86312A Strain of muscle(s) and tendon(s) of peroneal muscle group at lower leg level, left leg, initial encounter: Secondary | ICD-10-CM

## 2024-02-11 DIAGNOSIS — M722 Plantar fascial fibromatosis: Secondary | ICD-10-CM | POA: Diagnosis not present

## 2024-02-11 MED ORDER — PREGABALIN 50 MG CAPSULE
ORAL_CAPSULE | Freq: Every evening | ORAL | 1 refills | 30.00000 days
Start: 2024-02-11 — End: 2025-02-10

## 2024-02-11 MED ORDER — OXYBUTYNIN CHLORIDE 5 MG TABLET
ORAL_TABLET | Freq: Two times a day (BID) | ORAL | 3 refills | 90.00000 days | Status: CP
Start: 2024-02-11 — End: 2025-02-09

## 2024-02-11 NOTE — Progress Notes (Signed)
 Subjective:  Patient ID: Jason Davidson, male    DOB: May 25, 1981,  MRN: [id number]  Chief Complaint  Patient presents with   Plantar Fasciitis    MRI results    43 y.o. male presents with the above complaint. History confirmed with patient.  He returns for follow-up and dealing with a new issue with pain on the lateral ankle and heel is still painful.  Objective:  Physical Exam: warm, good capillary refill, no trophic changes or ulcerative lesions, normal DP and PT pulses, normal sensory exam, and pain on palpation of plantar medial heel at insertion of plantar fascia.  Today also has pain on the peroneal tendons   Radiographs: Previous x-rays taken December 24 showed no stress fracture or heel spur'   Narrative & Impression  CLINICAL DATA:  Ongoing plantar fasciitis for 1 year. No improvement from multiple steroid injections. No recent injury or prior relevant surgery.   EXAM: MR OF THE LEFT HEEL WITHOUT CONTRAST   TECHNIQUE: Multiplanar, multisequence MR imaging of the left hindfoot/heel was performed. No intravenous contrast was administered.   COMPARISON:  Radiographs 06/21/2022.  MRI 04/20/2018.   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 No full-thickness tendon tear or tendon retraction. No significant peroneal tendon sheath fluid or subluxation.   Posteromedial: Intact and normally positioned.   Anterior: Intact and normally positioned.   Achilles: Intact.   Plantar Fascia: Similar chronic thickening and T2 hyperintensity within the central cord of the plantar fascia. No evidence of fascial rupture. There is mild reactive edema within the plantar aspect of the calcaneal tuberosity, without significant spurring. Mildly increased edema within the posterior aspects of the flexor digitorum brevis and abductor hallucis muscles.   LIGAMENTS   Lateral: The  anterior and posterior talofibular and calcaneofibular ligaments are intact.The inferior tibiofibular ligaments appear intact.   Medial: The deltoid and visualized portions of the spring ligament appear intact.   CARTILAGE AND BONES   Ankle Joint: No significant ankle joint effusion. There is a new osteochondral lesion of the talar dome medially which appears cystic and may be degenerative. This measures up to 7 mm on sagittal image 8/106.   Subtalar Joints/Sinus Tarsi: Unremarkable.   Bones: No evidence of acute fracture, dislocation or osteonecrosis.   Other: Moderate generalized subcutaneous edema surrounding the ankle and extending into the dorsal aspect of the foot, increased from previous examination. No organized fluid collection or foreign body identified.   IMPRESSION: 1. Similar findings of chronic plantar fasciitis with mild reactive edema in the plantar aspect of the calcaneal tuberosity. No evidence of fascial rupture. Mildly increased edema within the posterior aspects of the flexor digitorum brevis and abductor hallucis muscles, likely reactive. 2. 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 No acute osseous findings. 3. 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 4. Moderate generalized subcutaneous edema surrounding the ankle and extending into the dorsal aspect of the foot, increased from previous examination.     Electronically Signed   By: Elsie Perone M.D.   On: 01/18/2024 11:45     Assessment:   1. Plantar fasciitis of left foot   2. Tear of peroneal tendon, left, initial encounter      Plan:  Patient was evaluated and treated and all questions answered.  We reviewed his MRI findings.  The plantar fascia is intact and  there is no evidence of stress fracture of the calcaneus.  He may be weightbearing as tolerated in regards to  his heel.  He also has a peroneus brevis tendon tear which I suspect is the main issue of the lateral ankle pain.  I recommended physical therapy for this and new referral was placed.  Discussed surgical treatment options for both of these issues which may benefit him in the long run but I discussed with him this will not be feasible until his A1c is less than 8%.  He should continue glycemic control with his PCP.  Follow-up with me in 10 weeks to reevaluate.  Return in about 10 weeks (around 04/21/2024) for f/u peroneal tendonosis and plantar fascia .

## 2024-02-12 MED ORDER — PREGABALIN 50 MG CAPSULE
ORAL_CAPSULE | Freq: Every evening | ORAL | 1 refills | 30.00000 days | Status: CP
Start: 2024-02-12 — End: 2025-02-11

## 2024-02-12 NOTE — Unmapped (Signed)
 Patient is requesting the following refill  Requested Prescriptions     Pending Prescriptions Disp Refills    pregabalin  (LYRICA ) 50 MG capsule 30 capsule 1     Sig: Take 1 capsule (50 mg total) by mouth at bedtime.       Last refill given on: 01/08/24 with 30 count and 1 refills.     Recent Visits  Date Type Provider Dept   01/08/24 Office Visit Geralene Levorn Geralds, FNP [place] [facility] [address] [facility]   11/27/23 Office Visit Geralene Levorn Geralds, FNP [place] [facility] [address] [facility]   10/08/23 Office Visit Geralene Levorn Geralds, FNP [place] [facility] [address] [facility]   09/17/23 Office Visit Geralene Levorn Geralds, FNP [place] [facility] [address] At [facility]   08/08/23 Office Visit Geralene Levorn Geralds, FNP [place] [facility] [address] [facility]   07/08/23 Office Visit Geralene Levorn Geralds, FNP [place] [facility] [address] [facility]   06/25/23 Office Visit Geralene Levorn Geralds, FNP [place] [facility] [address] At [facility]   04/23/23 Office Visit Geralene Levorn Geralds, FNP [place] [facility] [address] [facility]   03/05/23 Office Visit Geralene Levorn Geralds, FNP  [facility] [address] [facility]   Showing recent visits within past 365 days and meeting all other requirements  Future Appointments  Date Type Provider Dept   04/21/24 Appointment Geralene Levorn Geralds, FNP  [facility] [address] [facility]   Showing future appointments within next 365 days and meeting all other requirements       Opioid Monitoring   Urine Tox Screen Last Drug Screen Date: Not Found  Opiate Confirmation Test Last Drug Screen Date: Not Found  Last Opioid Dispensed Provider: Sharyle Slater Jim, PA  Prescribed MEDD : 0  Last PDMP Review: 12/29/2023 11:17 AM  Last Opioid Pain Agreement Signed Date: Not Found  Last Non Opioid Controlled Substance Pain Agreement Signed Date: Not Found    Naloxone  Ordered: 12/29/2023  Last OV: 01/08/2024

## 2024-02-13 MED ORDER — ONDANSETRON HCL 4 MG TABLET
ORAL_TABLET | Freq: Three times a day (TID) | ORAL | 1 refills | 10.00000 days | Status: CP | PRN
Start: 2024-02-13 — End: ?

## 2024-02-13 MED ORDER — PROMETHAZINE 25 MG TABLET
ORAL_TABLET | Freq: Three times a day (TID) | ORAL | 1 refills | 10.00000 days | Status: CP | PRN
Start: 2024-02-13 — End: 2025-02-12

## 2024-02-13 MED ORDER — DIPHENOXYLATE-ATROPINE 2.5 MG-0.025 MG TABLET
ORAL_TABLET | Freq: Four times a day (QID) | ORAL | 1 refills | 8.00000 days | Status: CP | PRN
Start: 2024-02-13 — End: 2025-02-12

## 2024-02-18 NOTE — Unmapped (Signed)
 The [facility] has made a second and final attempt to reach this patient to refill the following medication:COSENTYX  UNOREADY PEN 300 mg/2 mL Pnij (secukinumab ).      We have left voicemails on the following phone numbers: [phone], have sent a text message to the following phone numbers: [phone], and have sent a Mychart questionnaire..    Dates contacted: 02/11/24-02/18/24  Last scheduled delivery: 01/16/24    The patient may be at risk of non-compliance with this medication. The patient should call the [facility] at [phone]  Option 4, then Option 2: Dermatology, Gastroenterology, Rheumatology to refill medication.    Dena LOISE [facility] Special[facility] and Home Delivery Oncologist

## 2024-02-20 NOTE — Unmapped (Signed)
 Teigen D Celia Raddle. has been contacted in regards to their refill of Cosentyx . At this time, they have declined refill due to has stop taken the medication and will be getting infusions. Refill assessment call date has been updated per the patient's request.

## 2024-02-20 NOTE — Unmapped (Signed)
 Specialty Medication(s): Cosentyx     Logan Quinn has been dis-enrolled from the [facility] specialty pharmacy services as a result of a change in therapy. The patient is now taking infliximab and is not filling at the [facility].    Additional information provided to the patient: na, per chart patient has infusion appt soon    Logan Quinn A [facility] Specialty Pharmacist

## 2024-02-25 ENCOUNTER — Encounter: Payer: Self-pay | Admitting: Podiatry

## 2024-03-05 ENCOUNTER — Ambulatory Visit: Admit: 2024-03-05 | Discharge: 2024-03-06 | Payer: Medicare (Managed Care)

## 2024-03-05 ENCOUNTER — Encounter: Admit: 2024-03-05 | Discharge: 2024-03-06 | Payer: Medicare (Managed Care)

## 2024-03-05 DIAGNOSIS — L732 Hidradenitis suppurativa: Principal | ICD-10-CM

## 2024-03-10 DIAGNOSIS — L732 Hidradenitis suppurativa: Principal | ICD-10-CM

## 2024-03-10 MED ORDER — OXYCODONE-ACETAMINOPHEN 5 MG-325 MG TABLET
ORAL_TABLET | Freq: Four times a day (QID) | ORAL | 0 refills | 5.00000 days | Status: CP | PRN
Start: 2024-03-10 — End: ?

## 2024-03-18 ENCOUNTER — Encounter: Admit: 2024-03-18 | Discharge: 2024-03-19 | Payer: Medicare (Managed Care)

## 2024-03-29 DIAGNOSIS — J4520 Mild intermittent asthma, uncomplicated: Principal | ICD-10-CM

## 2024-03-29 DIAGNOSIS — M79672 Pain in left foot: Principal | ICD-10-CM

## 2024-03-29 DIAGNOSIS — M1A9XX0 Chronic gout, unspecified, without tophus (tophi): Principal | ICD-10-CM

## 2024-03-29 DIAGNOSIS — Z794 Long term (current) use of insulin: Principal | ICD-10-CM

## 2024-03-29 DIAGNOSIS — N3944 Nocturnal enuresis: Principal | ICD-10-CM

## 2024-03-29 DIAGNOSIS — J302 Other seasonal allergic rhinitis: Principal | ICD-10-CM

## 2024-03-29 DIAGNOSIS — N3281 Overactive bladder: Principal | ICD-10-CM

## 2024-03-29 DIAGNOSIS — E1165 Type 2 diabetes mellitus with hyperglycemia: Principal | ICD-10-CM

## 2024-03-29 DIAGNOSIS — I10 Essential (primary) hypertension: Principal | ICD-10-CM

## 2024-03-29 DIAGNOSIS — E039 Hypothyroidism, unspecified: Principal | ICD-10-CM

## 2024-03-29 DIAGNOSIS — E7800 Pure hypercholesterolemia, unspecified: Principal | ICD-10-CM

## 2024-03-29 DIAGNOSIS — G8929 Other chronic pain: Principal | ICD-10-CM

## 2024-03-29 DIAGNOSIS — G4733 Obstructive sleep apnea (adult) (pediatric): Principal | ICD-10-CM

## 2024-03-29 DIAGNOSIS — K219 Gastro-esophageal reflux disease without esophagitis: Principal | ICD-10-CM

## 2024-03-29 MED ORDER — ANASTROZOLE 1 MG TABLET
ORAL_TABLET | Freq: Every day | ORAL | 11 refills | 30.00000 days
Start: 2024-03-29 — End: 2025-03-29

## 2024-03-29 MED ORDER — ALLOPURINOL 300 MG TABLET
ORAL_TABLET | Freq: Every day | ORAL | 3 refills | 90.00000 days | Status: CP
Start: 2024-03-29 — End: 2025-03-29

## 2024-03-29 MED ORDER — ATORVASTATIN 20 MG TABLET
ORAL_TABLET | Freq: Every day | ORAL | 3 refills | 90.00000 days | Status: CP
Start: 2024-03-29 — End: 2025-03-29

## 2024-03-29 MED ORDER — PANTOPRAZOLE 40 MG TABLET,DELAYED RELEASE
ORAL_TABLET | Freq: Two times a day (BID) | ORAL | 1 refills | 90.00000 days | Status: CP
Start: 2024-03-29 — End: 2025-03-29

## 2024-03-29 MED ORDER — FAMOTIDINE 20 MG TABLET
ORAL_TABLET | Freq: Two times a day (BID) | ORAL | 3 refills | 90.00000 days | Status: CP
Start: 2024-03-29 — End: 2025-03-29

## 2024-03-29 MED ORDER — PREGABALIN 50 MG CAPSULE
ORAL_CAPSULE | Freq: Every evening | ORAL | 1 refills | 30.00000 days | Status: CP
Start: 2024-03-29 — End: 2025-03-29

## 2024-03-29 MED ORDER — LOSARTAN 50 MG TABLET
ORAL_TABLET | Freq: Every day | ORAL | 3 refills | 90.00000 days | Status: CP
Start: 2024-03-29 — End: 2025-03-29

## 2024-03-29 MED ORDER — FLUTICASONE PROPIONATE 50 MCG/ACTUATION NASAL SPRAY,SUSPENSION
Freq: Two times a day (BID) | NASAL | 11 refills | 60.00000 days | Status: CP
Start: 2024-03-29 — End: 2025-03-29

## 2024-03-29 MED ORDER — MONTELUKAST 10 MG TABLET
ORAL_TABLET | Freq: Every day | ORAL | 3 refills | 90.00000 days | Status: CP
Start: 2024-03-29 — End: 2025-03-30

## 2024-03-29 MED ORDER — TESTOSTERONE CYPIONATE 200 MG/ML INTRAMUSCULAR OIL
INTRAMUSCULAR | 5 refills | 70.00000 days
Start: 2024-03-29 — End: ?

## 2024-03-29 MED ORDER — LEVOTHYROXINE 50 MCG TABLET
ORAL_TABLET | Freq: Every day | ORAL | 3 refills | 90.00000 days | Status: CP
Start: 2024-03-29 — End: 2025-03-29

## 2024-03-29 MED ORDER — GLIPIZIDE 10 MG TABLET
ORAL_TABLET | Freq: Two times a day (BID) | ORAL | 3 refills | 90.00000 days | Status: CP
Start: 2024-03-29 — End: 2025-03-28

## 2024-03-29 MED ORDER — OXYBUTYNIN CHLORIDE 5 MG TABLET
ORAL_TABLET | Freq: Two times a day (BID) | ORAL | 3 refills | 90.00000 days | Status: CP
Start: 2024-03-29 — End: 2025-03-28

## 2024-04-15 ENCOUNTER — Ambulatory Visit: Admitting: Podiatry

## 2024-04-15 ENCOUNTER — Encounter: Admit: 2024-04-15 | Discharge: 2024-04-16 | Payer: Medicare (Managed Care)

## 2024-04-15 DIAGNOSIS — E291 Testicular hypofunction: Principal | ICD-10-CM

## 2024-04-18 DIAGNOSIS — L732 Hidradenitis suppurativa: Principal | ICD-10-CM

## 2024-04-21 ENCOUNTER — Ambulatory Visit: Admitting: Podiatry

## 2024-04-21 DIAGNOSIS — J3089 Other allergic rhinitis: Principal | ICD-10-CM

## 2024-04-21 DIAGNOSIS — L732 Hidradenitis suppurativa: Principal | ICD-10-CM

## 2024-04-21 DIAGNOSIS — E039 Hypothyroidism, unspecified: Principal | ICD-10-CM

## 2024-04-21 DIAGNOSIS — Z794 Long term (current) use of insulin: Principal | ICD-10-CM

## 2024-04-21 DIAGNOSIS — E1165 Type 2 diabetes mellitus with hyperglycemia: Principal | ICD-10-CM

## 2024-04-21 DIAGNOSIS — I10 Essential (primary) hypertension: Principal | ICD-10-CM

## 2024-04-21 DIAGNOSIS — G4733 Obstructive sleep apnea (adult) (pediatric): Principal | ICD-10-CM

## 2024-04-21 MED ORDER — LEVOCETIRIZINE 5 MG TABLET
ORAL_TABLET | Freq: Every evening | ORAL | 1 refills | 90.00000 days | Status: CP
Start: 2024-04-21 — End: 2025-04-21

## 2024-04-26 MED ORDER — DICYCLOMINE 10 MG CAPSULE
ORAL_CAPSULE | Freq: Four times a day (QID) | ORAL | 0 refills | 30.00000 days | Status: CP
Start: 2024-04-26 — End: 2025-04-26

## 2024-04-27 DIAGNOSIS — H33002 Unspecified retinal detachment with retinal break, left eye: Principal | ICD-10-CM

## 2024-04-28 ENCOUNTER — Ambulatory Visit: Admitting: Podiatry

## 2024-04-28 DIAGNOSIS — L732 Hidradenitis suppurativa: Principal | ICD-10-CM

## 2024-04-28 DIAGNOSIS — Z79899 Other long term (current) drug therapy: Principal | ICD-10-CM

## 2024-04-28 DIAGNOSIS — Z794 Long term (current) use of insulin: Principal | ICD-10-CM

## 2024-04-28 DIAGNOSIS — E1165 Type 2 diabetes mellitus with hyperglycemia: Principal | ICD-10-CM

## 2024-04-28 MED ORDER — HUMALOG U-100 INSULIN 100 UNIT/ML SUBCUTANEOUS SOLUTION
0 refills | 0.00000 days
Start: 2024-04-28 — End: 2025-04-27

## 2024-04-30 MED ORDER — SYRINGE WITH NEEDLE 3 ML 21 GAUGE X 1 1/2"
0 refills | 0.00000 days | Status: CP
Start: 2024-04-30 — End: ?

## 2024-04-30 MED ORDER — TESTOSTERONE CYPIONATE 200 MG/ML INTRAMUSCULAR OIL
INTRAMUSCULAR | 1 refills | 70.00000 days | Status: CP
Start: 2024-04-30 — End: ?

## 2024-04-30 MED ORDER — SAFETY NEEDLES 18 GAUGE X 1 1/2"
INTRAMUSCULAR | 0 refills | 0.00000 days | Status: CP
Start: 2024-04-30 — End: ?

## 2024-04-30 MED ORDER — HUMALOG U-100 INSULIN 100 UNIT/ML SUBCUTANEOUS SOLUTION
0 refills | 0.00000 days | Status: CP
Start: 2024-04-30 — End: 2025-04-27

## 2024-05-03 ENCOUNTER — Ambulatory Visit: Admitting: Podiatry

## 2024-05-03 DIAGNOSIS — M62462 Contracture of muscle, left lower leg: Secondary | ICD-10-CM

## 2024-05-03 DIAGNOSIS — M722 Plantar fascial fibromatosis: Secondary | ICD-10-CM | POA: Diagnosis not present

## 2024-05-04 MED ORDER — ALBUTEROL SULFATE HFA 90 MCG/ACTUATION AEROSOL INHALER
Freq: Four times a day (QID) | RESPIRATORY_TRACT | 5 refills | 0.00000 days | Status: CP | PRN
Start: 2024-05-04 — End: 2025-05-03

## 2024-05-04 MED ORDER — BUDESONIDE-FORMOTEROL HFA 80 MCG-4.5 MCG/ACTUATION AEROSOL INHALER
Freq: Two times a day (BID) | RESPIRATORY_TRACT | 0 refills | 31.00000 days
Start: 2024-05-04 — End: 2025-05-04

## 2024-05-05 NOTE — Progress Notes (Signed)
 Subjective:  Patient ID: Jason Davidson, male    DOB: Aug 13, 1980,  MRN: [id number]  Chief Complaint  Patient presents with   Plantar Fasciitis    10 wk f/u peroneal tendonosis and plantar fasciitis - patient reports that his PCP has advised him not to have any more steroids or steroid injections - She advised that his blood sugar will not come down as long as he is taking any kind of steroid    43 y.o. male presents with the above complaint. History confirmed with patient.  He returns for follow-up   Objective:  Physical Exam: warm, good capillary refill, no trophic changes or ulcerative lesions, normal DP and PT pulses, normal sensory exam, and pain on palpation of plantar medial heel at insertion of plantar fascia of left foot.  No pain on peroneal's today.  Gastrocnemius equinus present  A1c 04/21/2024 shows 7.8%  Radiographs: Previous x-rays taken December 24 showed no stress fracture or heel spur'   Narrative & Impression  CLINICAL DATA:  Ongoing plantar fasciitis for 1 year. No improvement from multiple steroid injections. No recent injury or prior relevant surgery.   EXAM: MR OF THE LEFT HEEL WITHOUT CONTRAST   TECHNIQUE: Multiplanar, multisequence MR imaging of the left hindfoot/heel was performed. No intravenous contrast was administered.   COMPARISON:  Radiographs 06/21/2022.  MRI 04/20/2018.   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 No full-thickness tendon tear or tendon retraction. No significant peroneal tendon sheath fluid or subluxation.   Posteromedial: Intact and normally positioned.   Anterior: Intact and normally positioned.   Achilles: Intact.   Plantar Fascia: Similar chronic thickening and T2 hyperintensity within the central cord of the plantar fascia. No evidence of fascial rupture. There is mild reactive edema within the plantar aspect of  the calcaneal tuberosity, without significant spurring. Mildly increased edema within the posterior aspects of the flexor digitorum brevis and abductor hallucis muscles.   LIGAMENTS   Lateral: The anterior and posterior talofibular and calcaneofibular ligaments are intact.The inferior tibiofibular ligaments appear intact.   Medial: The deltoid and visualized portions of the spring ligament appear intact.   CARTILAGE AND BONES   Ankle Joint: No significant ankle joint effusion. There is a new osteochondral lesion of the talar dome medially which appears cystic and may be degenerative. This measures up to 7 mm on sagittal image 8/106.   Subtalar Joints/Sinus Tarsi: Unremarkable.   Bones: No evidence of acute fracture, dislocation or osteonecrosis.   Other: Moderate generalized subcutaneous edema surrounding the ankle and extending into the dorsal aspect of the foot, increased from previous examination. No organized fluid collection or foreign body identified.   IMPRESSION: 1. Similar findings of chronic plantar fasciitis with mild reactive edema in the plantar aspect of the calcaneal tuberosity. No evidence of fascial rupture. Mildly increased edema within the posterior aspects of the flexor digitorum brevis and abductor hallucis muscles, likely reactive. 2. 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 No acute osseous findings. 3. 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 4. Moderate generalized subcutaneous edema surrounding the ankle and extending into the dorsal aspect of the foot, increased from previous examination.     Electronically Signed   By: Elsie Perone M.D.   On: 01/18/2024 11:45     Assessment:   1. Plantar fasciitis of left foot  2. Gastrocnemius equinus of left lower extremity      Plan:  Patient was evaluated and treated and  all questions answered.  He has had improvement in his A1c.  Surgery is reasonable this point.  Would like like to see if he can maintain this for the next few months to reduce his risk of surgical complications.  He is going to continue to work on this no further steroids by mouth or injections are recommended.  Surgical we discussed gastrocnemius recession and plantar fascial release.  We discussed the risk benefits and potential complications include not limited to pain, swelling, infection, scar, numbness which may be temporary or permanent, chronic pain, stiffness, nerve pain or damage, wound healing problems.  He understands wished to proceed.  He understands recovery process as well.  Surgery be scheduled for February he has a new A1c that week, he also has biologic injections for HS, should be able to resume this a few weeks after surgery.  No follow-ups on file.

## 2024-05-11 DIAGNOSIS — J410 Simple chronic bronchitis: Principal | ICD-10-CM

## 2024-05-11 DIAGNOSIS — Z0000 Encounter for general adult medical examination without abnormal findings: Principal | ICD-10-CM

## 2024-05-11 DIAGNOSIS — J22 Unspecified acute lower respiratory infection: Principal | ICD-10-CM

## 2024-05-11 MED ORDER — BUDESONIDE-FORMOTEROL HFA 80 MCG-4.5 MCG/ACTUATION AEROSOL INHALER
Freq: Two times a day (BID) | RESPIRATORY_TRACT | 2 refills | 31.00000 days | Status: CP
Start: 2024-05-11 — End: 2025-05-11

## 2024-05-11 MED ORDER — AZITHROMYCIN 250 MG TABLET
ORAL_TABLET | ORAL | 0 refills | 0.00000 days | Status: CP
Start: 2024-05-11 — End: ?

## 2024-05-11 MED ORDER — ALBUTEROL SULFATE 2.5 MG/3 ML (0.083 %) SOLUTION FOR NEBULIZATION
RESPIRATORY_TRACT | 1 refills | 5.00000 days | Status: CP | PRN
Start: 2024-05-11 — End: 2025-05-11

## 2024-05-14 ENCOUNTER — Ambulatory Visit: Admitting: Podiatry

## 2024-05-14 DIAGNOSIS — Z91198 Patient's noncompliance with other medical treatment and regimen for other reason: Secondary | ICD-10-CM

## 2024-05-14 NOTE — Progress Notes (Signed)
 1. Failure to attend appointment with reason given    Appointment rescheduled by patient/POA.

## 2024-05-16 DIAGNOSIS — L732 Hidradenitis suppurativa: Principal | ICD-10-CM

## 2024-05-17 ENCOUNTER — Ambulatory Visit: Admitting: Podiatry

## 2024-05-17 DIAGNOSIS — Z91198 Patient's noncompliance with other medical treatment and regimen for other reason: Secondary | ICD-10-CM

## 2024-05-17 NOTE — Progress Notes (Signed)
 1. Failure to attend appointment with reason given    Patient rescheduled appointment.

## 2024-06-03 ENCOUNTER — Other Ambulatory Visit: Payer: Self-pay | Admitting: Medical Genetics

## 2024-06-07 ENCOUNTER — Encounter: Payer: Self-pay | Admitting: Podiatry

## 2024-06-08 DIAGNOSIS — E291 Testicular hypofunction: Principal | ICD-10-CM

## 2024-06-08 MED ORDER — SYRINGE WITH NEEDLE 3 ML 21 GAUGE X 1 1/2"
0 refills | 0.00000 days | Status: CP
Start: 2024-06-08 — End: ?

## 2024-06-08 MED ORDER — TESTOSTERONE CYPIONATE 200 MG/ML INTRAMUSCULAR OIL
INTRAMUSCULAR | 0 refills | 70.00000 days | Status: CP
Start: 2024-06-08 — End: ?

## 2024-06-08 MED ORDER — SAFETY NEEDLES 18 GAUGE X 1 1/2"
INTRAMUSCULAR | 0 refills | 0.00000 days | Status: CP
Start: 2024-06-08 — End: ?

## 2024-06-17 ENCOUNTER — Encounter: Admit: 2024-06-17 | Discharge: 2024-06-18 | Payer: Medicare (Managed Care)

## 2024-06-17 DIAGNOSIS — L732 Hidradenitis suppurativa: Principal | ICD-10-CM

## 2024-06-17 DIAGNOSIS — I10 Essential (primary) hypertension: Principal | ICD-10-CM

## 2024-06-17 DIAGNOSIS — Z794 Long term (current) use of insulin: Principal | ICD-10-CM

## 2024-06-17 DIAGNOSIS — E1165 Type 2 diabetes mellitus with hyperglycemia: Principal | ICD-10-CM

## 2024-06-17 MED ORDER — OMNIPOD 5 PACK PODS
11 refills | 0.00000 days
Start: 2024-06-17 — End: 2025-06-15

## 2024-06-18 MED ORDER — OMNIPOD 5 PACK PODS
11 refills | 0.00000 days | Status: CP
Start: 2024-06-18 — End: 2025-06-15

## 2024-06-21 ENCOUNTER — Other Ambulatory Visit

## 2024-06-22 ENCOUNTER — Encounter: Admit: 2024-06-22 | Discharge: 2024-06-23 | Payer: Medicare (Managed Care)

## 2024-06-22 DIAGNOSIS — E291 Testicular hypofunction: Principal | ICD-10-CM

## 2024-06-22 MED ORDER — SYRINGE WITH NEEDLE 3 ML 21 GAUGE X 1 1/2"
0 refills | 0.00000 days | Status: CP
Start: 2024-06-22 — End: ?

## 2024-06-22 MED ORDER — SAFETY NEEDLES 18 GAUGE X 1 1/2"
0 refills | 0.00000 days | Status: CP
Start: 2024-06-22 — End: ?

## 2024-06-22 MED ORDER — ANASTROZOLE 1 MG TABLET
ORAL_TABLET | Freq: Every day | ORAL | 11 refills | 30.00000 days | Status: CP
Start: 2024-06-22 — End: 2025-06-22

## 2024-06-22 MED ORDER — TESTOSTERONE CYPIONATE 200 MG/ML INTRAMUSCULAR OIL
INTRAMUSCULAR | 4 refills | 70.00000 days | Status: CP
Start: 2024-06-22 — End: ?

## 2024-06-23 ENCOUNTER — Other Ambulatory Visit

## 2024-06-25 ENCOUNTER — Other Ambulatory Visit

## 2024-06-26 ENCOUNTER — Other Ambulatory Visit: Payer: Self-pay

## 2024-06-29 ENCOUNTER — Other Ambulatory Visit: Payer: Self-pay

## 2024-06-29 ENCOUNTER — Other Ambulatory Visit
Admission: RE | Admit: 2024-06-29 | Discharge: 2024-06-29 | Disposition: A | Discharge status: Home / Self Care | Payer: Self-pay | Source: Ambulatory Visit | Attending: Medical Genetics | Admitting: Medical Genetics

## 2024-07-08 ENCOUNTER — Telehealth: Payer: Self-pay | Admitting: Podiatry

## 2024-07-08 ENCOUNTER — Other Ambulatory Visit: Payer: Self-pay | Admitting: Podiatry

## 2024-07-08 DIAGNOSIS — R2689 Other abnormalities of gait and mobility: Secondary | ICD-10-CM

## 2024-07-08 NOTE — Telephone Encounter (Signed)
 Called and scheduled patient for surgery on 07/30/2024 at [facility]. Patient is not on any blood thinners but does take trulicity and has been given directions to d/c use prior to surgery. Patient pharmacy correct in chart.

## 2024-07-09 ENCOUNTER — Telehealth: Payer: Self-pay | Admitting: Podiatry

## 2024-07-09 LAB — GENECONNECT MOLECULAR SCREEN: Genetic Analysis Overall Interpretation: NEGATIVE

## 2024-07-09 NOTE — Telephone Encounter (Signed)
 DOS- 07/30/2024  ENDOSCOPIC PLANTAR FASCIOTOMY LT- 70106 GASTROCNEMIUS RECESSION LT- 72312  [facility] EFFECTIVE DATE- 06/10/2024  DEDUCTIBLE- $283 REMAINING- $0 OOP- $9250 REMAINING- $1032 COINSURANCE- 20%  PER [employer] PORTAL, PRIOR AUTH IS NOT REQUIRED FOR CPT CODES 70106 AND [id number]. DECISION ID# [id number]

## 2024-07-11 DIAGNOSIS — L732 Hidradenitis suppurativa: Principal | ICD-10-CM

## 2024-07-16 ENCOUNTER — Inpatient Hospital Stay: Admission: RE | Admit: 2024-07-16

## 2024-07-16 ENCOUNTER — Other Ambulatory Visit: Payer: Self-pay

## 2024-07-16 VITALS — Ht 71.0 in | Wt >= 6400 oz

## 2024-07-16 DIAGNOSIS — M722 Plantar fascial fibromatosis: Secondary | ICD-10-CM

## 2024-07-16 DIAGNOSIS — E1142 Type 2 diabetes mellitus with diabetic polyneuropathy: Secondary | ICD-10-CM

## 2024-07-16 DIAGNOSIS — Z01818 Encounter for other preprocedural examination: Secondary | ICD-10-CM

## 2024-07-16 NOTE — Patient Instructions (Addendum)
 Your procedure is scheduled on: FRIDAY 07/30/24 Report to the Registration Desk on the 1st floor of the [facility]. To find out your arrival time, please call [phone] between 1PM - 3PM on: THURSDAY 07/29/24 If your arrival time is 6:00 am, do not arrive before that time as the [facility] entrance doors do not open until 6:00 am.  REMEMBER: Instructions that are not followed completely may result in serious medical risk, up to and including death; or upon the discretion of your surgeon and anesthesiologist your surgery may need to be rescheduled.  Do not eat food after midnight the night before surgery.  No gum chewing or hard candies.  You may however, drink WATER up to 2 hours before you are scheduled to arrive for your surgery. Do not drink anything within 2 hours of your scheduled arrival time.  One week prior to surgery: Stop Anti-inflammatories (NSAIDS) such as Advil , Aleve, Ibuprofen , Motrin , Naproxen, Naprosyn and Aspirin based products such as Excedrin, Goody's Powder, BC Powder. Stop ANY OVER THE COUNTER supplements until after surgery.  STOP Dulaglutide 7 days before surgery. Last dose: 07/22/24  STOP tadalafil (CIALIS) 2 days before surgery: Last dose: 07/27/24  You may however, continue to take Tylenol  if needed for pain up until the day of surgery.  Continue taking all of your other prescription medications up until the day of surgery.  DO NOT take any insulin the morning of your surgery.    ON THE DAY OF SURGERY ONLY TAKE THESE MEDICATIONS WITH SIPS OF WATER:  ALPRAZolam (XANAX)  atorvastatin (LIPITOR)  levothyroxine (SYNTHROID)  pantoprazole (PROTONIX)  WELLBUTRIN XL  DULoxetine (CYMBALTA)   Use inhalers on the day of surgery and bring to the hospital.  No Alcohol for 24 hours before or after surgery.  No Smoking including e-cigarettes for 24 hours before surgery.  No chewable tobacco products for at least 6 hours before surgery.  No nicotine patches on  the day of surgery.  Do not use any recreational drugs for at least a week (preferably 2 weeks) before your surgery.  Please be advised that the combination of cocaine and anesthesia may have negative outcomes, up to and including death. If you test positive for cocaine, your surgery will be cancelled.  On the morning of surgery brush your teeth with toothpaste and water, you may rinse your mouth with mouthwash if you wish. Do not swallow any toothpaste or mouthwash.  Use CHG Soap or wipes as directed on instruction sheet.  Do not wear jewelry, make-up, hairpins, clips or nail polish.  For welded (permanent) jewelry: bracelets, anklets, waist bands, etc.  Please have this removed prior to surgery.  If it is not removed, there is a chance that hospital personnel will need to cut it off on the day of surgery.  Do not wear lotions, powders, or perfumes.   Do not shave body hair from the neck down 48 hours before surgery.  Contact lenses, hearing aids and dentures may not be worn into surgery.  Do not bring valuables to the hospital. [facility] is not responsible for any missing/lost belongings or valuables.   Bring your C-PAP to the hospital in case you may have to spend the night.   Notify your doctor if there is any change in your medical condition (cold, fever, infection).  Wear comfortable clothing (specific to your surgery type) to the hospital.  After surgery, you can help prevent lung complications by doing breathing exercises.  Take deep breaths and  cough every 1-2 hours. Your doctor may order a device called an Incentive Spirometer to help you take deep breaths.  If you are being discharged the day of surgery, you will not be allowed to drive home. You will need a responsible individual to drive you home and stay with you for 24 hours after surgery.   If you are taking public transportation, you will need to have a responsible individual with you.  Please call the  Pre-admissions Testing Dept. at [phone] if you have any questions about these instructions.  Surgery Visitation Policy:  Patients having surgery or a procedure may have two visitors.  Children under the age of 38 must have an adult with them who is not the patient.  Merchandiser, Retail to address health-related social needs:  [email or web address]                                                                                                             Preparing for Surgery with CHLORHEXIDINE GLUCONATE (CHG) Soap  Chlorhexidine Gluconate (CHG) Soap  o An antiseptic cleaner that kills germs and bonds with the skin to continue killing germs even after washing  o Used for showering the night before surgery and morning of surgery  Before surgery, you can play an important role by reducing the number of germs on your skin.  CHG (Chlorhexidine gluconate) soap is an antiseptic cleanser which kills germs and bonds with the skin to continue killing germs even after washing.  Please do not use if you have an allergy to CHG or antibacterial soaps. If your skin becomes reddened/irritated stop using the CHG.  1. Shower the NIGHT BEFORE SURGERY with CHG soap.  2. If you choose to wash your hair, wash your hair first as usual with your normal shampoo.  3. After shampooing, rinse your hair and body thoroughly to remove the shampoo.  4. Use CHG as you would any other liquid soap. You can apply CHG directly to the skin and wash gently with a clean washcloth.  5. Apply the CHG soap to your body only from the neck down. Do not use on open wounds or open sores. Avoid contact with your eyes, ears, mouth, and genitals (private parts). Wash face and genitals (private parts) with your normal soap.  6. Wash thoroughly, paying special attention to the area where your surgery will be performed.  7. Thoroughly rinse your body with warm water.  8. Do not shower/wash with your  normal soap after using and rinsing off the CHG soap.  9. Do not use lotions, oils, etc., after showering with CHG.  10. Pat yourself dry with a clean towel.  11. Wear clean pajamas to bed the night before surgery.  12. Place clean sheets on your bed the night of your shower and do not sleep with pets.  13. Do not apply any deodorants/lotions/powders.  14. Please wear clean clothes to the hospital.  15. Remember to brush your teeth with your regular toothpaste.

## 2024-07-26 ENCOUNTER — Other Ambulatory Visit

## 2024-07-30 ENCOUNTER — Ambulatory Visit: Admit: 2024-07-30 | Admitting: Podiatry

## 2024-07-30 ENCOUNTER — Encounter: Payer: Self-pay | Admitting: Urgent Care

## 2024-08-04 ENCOUNTER — Encounter: Admitting: Podiatry

## 2024-08-12 ENCOUNTER — Ambulatory Visit (INDEPENDENT_AMBULATORY_CARE_PROVIDER_SITE_OTHER): Admitting: Vascular Surgery

## 2024-08-12 ENCOUNTER — Ambulatory Visit: Admitting: Podiatry

## 2024-08-18 ENCOUNTER — Encounter: Admitting: Podiatry

## 2024-09-08 ENCOUNTER — Encounter: Admitting: Podiatry
